# Patient Record
Sex: Female | Born: 1965 | Race: White | Hispanic: No | Smoking: Never smoker
Health system: Southern US, Community
[De-identification: ages and names within clinical notes are randomized; demographics above are authoritative.]

## PROBLEM LIST (undated history)

## (undated) DIAGNOSIS — J449 Chronic obstructive pulmonary disease, unspecified: Secondary | ICD-10-CM

## (undated) DIAGNOSIS — G8220 Paraplegia, unspecified: Secondary | ICD-10-CM

## (undated) DIAGNOSIS — Z9889 Other specified postprocedural states: Secondary | ICD-10-CM

---

## 2006-10-30 ENCOUNTER — Emergency Department: Payer: Self-pay

## 2006-10-30 ENCOUNTER — Other Ambulatory Visit: Payer: Self-pay

## 2007-03-18 ENCOUNTER — Ambulatory Visit: Payer: Self-pay

## 2007-03-22 ENCOUNTER — Ambulatory Visit: Payer: Self-pay

## 2007-04-09 ENCOUNTER — Emergency Department: Payer: Self-pay | Admitting: Emergency Medicine

## 2007-05-07 ENCOUNTER — Ambulatory Visit: Payer: Self-pay | Admitting: Gastroenterology

## 2007-08-01 ENCOUNTER — Ambulatory Visit: Payer: Self-pay

## 2007-08-05 ENCOUNTER — Ambulatory Visit: Payer: Self-pay

## 2007-08-16 ENCOUNTER — Ambulatory Visit: Payer: Self-pay | Admitting: Internal Medicine

## 2007-12-18 ENCOUNTER — Ambulatory Visit: Payer: Self-pay | Admitting: Internal Medicine

## 2008-01-20 ENCOUNTER — Inpatient Hospital Stay: Payer: Self-pay | Admitting: Internal Medicine

## 2008-02-19 ENCOUNTER — Ambulatory Visit: Payer: Self-pay | Admitting: Internal Medicine

## 2008-04-03 ENCOUNTER — Inpatient Hospital Stay: Payer: Self-pay | Admitting: Internal Medicine

## 2008-04-03 ENCOUNTER — Other Ambulatory Visit: Payer: Self-pay

## 2008-06-03 ENCOUNTER — Encounter: Payer: Self-pay | Admitting: Internal Medicine

## 2008-07-03 ENCOUNTER — Encounter: Payer: Self-pay | Admitting: Internal Medicine

## 2008-08-03 ENCOUNTER — Encounter: Payer: Self-pay | Admitting: Internal Medicine

## 2008-08-06 ENCOUNTER — Ambulatory Visit: Payer: Self-pay | Admitting: Internal Medicine

## 2008-08-31 ENCOUNTER — Encounter: Payer: Self-pay | Admitting: Internal Medicine

## 2008-10-01 ENCOUNTER — Encounter: Payer: Self-pay | Admitting: Internal Medicine

## 2008-10-15 ENCOUNTER — Ambulatory Visit: Payer: Self-pay | Admitting: Internal Medicine

## 2010-05-21 ENCOUNTER — Emergency Department: Payer: Self-pay | Admitting: Emergency Medicine

## 2010-11-21 ENCOUNTER — Emergency Department: Payer: Self-pay | Admitting: Emergency Medicine

## 2010-11-21 ENCOUNTER — Emergency Department (HOSPITAL_COMMUNITY)
Admission: EM | Admit: 2010-11-21 | Discharge: 2010-11-21 | Disposition: A | Discharge status: Home / Self Care | Payer: Medicare Other | Attending: Emergency Medicine | Admitting: Emergency Medicine

## 2010-11-21 DIAGNOSIS — T8389XA Other specified complication of genitourinary prosthetic devices, implants and grafts, initial encounter: Secondary | ICD-10-CM | POA: Insufficient documentation

## 2010-11-21 DIAGNOSIS — Y833 Surgical operation with formation of external stoma as the cause of abnormal reaction of the patient, or of later complication, without mention of misadventure at the time of the procedure: Secondary | ICD-10-CM | POA: Insufficient documentation

## 2010-11-21 DIAGNOSIS — G8250 Quadriplegia, unspecified: Secondary | ICD-10-CM | POA: Insufficient documentation

## 2010-11-21 DIAGNOSIS — R339 Retention of urine, unspecified: Secondary | ICD-10-CM | POA: Insufficient documentation
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
## 2011-02-25 ENCOUNTER — Other Ambulatory Visit: Payer: Self-pay | Admitting: Family Medicine

## 2011-09-19 ENCOUNTER — Ambulatory Visit: Payer: Self-pay | Admitting: Family Medicine

## 2015-01-27 ENCOUNTER — Encounter: Payer: Medicare Other | Attending: General Surgery | Admitting: General Surgery

## 2015-01-27 DIAGNOSIS — Z87891 Personal history of nicotine dependence: Secondary | ICD-10-CM | POA: Diagnosis not present

## 2015-01-27 DIAGNOSIS — L89112 Pressure ulcer of right upper back, stage 2: Secondary | ICD-10-CM | POA: Diagnosis not present

## 2015-01-27 DIAGNOSIS — G8220 Paraplegia, unspecified: Secondary | ICD-10-CM | POA: Insufficient documentation

## 2015-01-27 DIAGNOSIS — L8993 Pressure ulcer of unspecified site, stage 3: Secondary | ICD-10-CM | POA: Diagnosis not present

## 2015-01-27 DIAGNOSIS — L89154 Pressure ulcer of sacral region, stage 4: Secondary | ICD-10-CM | POA: Insufficient documentation

## 2015-01-27 DIAGNOSIS — J449 Chronic obstructive pulmonary disease, unspecified: Secondary | ICD-10-CM | POA: Insufficient documentation

## 2015-01-27 DIAGNOSIS — J45909 Unspecified asthma, uncomplicated: Secondary | ICD-10-CM | POA: Diagnosis not present

## 2015-01-27 NOTE — Progress Notes (Signed)
See i heal 

## 2015-01-28 NOTE — Progress Notes (Addendum)
[name] ([id number]) Visit Report for 01/27/2015 Chief Complaint Document Details Patient Name: [name], [name] Date of Service: 01/27/2015 1:00 PM Medical Record Number: [id number] Patient Account Number: [id number] Date of Birth/Sex: [birth year] (49 y.o. Female) Treating RN: Primary Care Physician: [place], JULIE Other Clinician: Referring Physician: Treating Physician/Extender: Elayne Snare in Treatment: 0 Information Obtained from: Patient Electronic Signature(s) Signed: 01/27/2015 4:39:50 PM By: Ardath Sax MD Entered By: Ardath Sax on 01/27/2015 14:17:52 Schnitzer, [name] ([id number]) -------------------------------------------------------------------------------- HPI Details Patient Name: [name] Date of Service: 01/27/2015 1:00 PM Medical Record Number: [id number] Patient Account Number: [id number] Date of Birth/Sex: April 08, 1966 (49 y.o. Female) Treating RN: Primary Care Physician: [place], JULIE Other Clinician: Referring Physician: Treating Physician/Extender: Elayne Snare in Treatment: 0 Electronic Signature(s) Signed: 01/27/2015 4:39:50 PM By: Ardath Sax MD Entered By: Ardath Sax on 01/27/2015 14:18:03 Mathias, [name] ([id number]) -------------------------------------------------------------------------------- Physical Exam Details Patient Name: [name] Date of Service: 01/27/2015 1:00 PM Medical Record Number: [id number] Patient Account Number: [id number] Date of Birth/Sex: [birth year] (49 y.o. Female) Treating RN: Primary Care Physician: [place], JULIE Other Clinician: Referring Physician: Treating Physician/Extender: Elayne Snare in Treatment: 0 Electronic Signature(s) Signed: 01/27/2015 4:39:50 PM By: Ardath Sax MD Entered By: Ardath Sax on 01/27/2015 14:18:16 Desroches, [name] ([id number]) -------------------------------------------------------------------------------- Physician Orders  Details Patient Name: [name] Date of Service: 01/27/2015 1:00 PM Medical Record Patient Account Number: [id number] Number: Afful, RN, BSN, Treating RN: 1965/11/28 (49 y.o. [place] Sink Date of Birth/Sex: Female) Other Clinician: Primary Care Physician: [place], JULIE Treating Anayia Eugene Referring Physician: Physician/Extender: Tania Ade in Treatment: 0 Verbal / Phone Orders: Yes Clinician: Afful, RN, BSN, Rita Read Back and Verified: Yes Diagnosis Coding Wound Cleansing Wound #1 Right Scapula o Cleanse wound with mild soap and water o May Shower, gently pat wound dry prior to applying new dressing. Wound #2 Midline Sacrum o Cleanse wound with mild soap and water o May Shower, gently pat wound dry prior to applying new dressing. Skin Barriers/Peri-Wound Care Wound #1 Right Scapula o Barrier cream Wound #2 Midline Sacrum o Barrier cream Primary Wound Dressing Wound #1 Right Scapula o Aquacel Ag Wound #2 Midline Sacrum o Aquacel Ag Secondary Dressing Wound #1 Right Scapula o Boardered Foam Dressing Wound #2 Midline Sacrum o Boardered Foam Dressing Dressing Change Frequency Wound #1 Right Scapula o Change dressing every other day. AZANA, KIESLER. ([id number]) Wound #2 Midline Sacrum o Change dressing every other day. Follow-up Appointments Wound #1 Right Scapula o Return Appointment in 1 week. Wound #2 Midline Sacrum o Return Appointment in 1 week. Off-Loading Wound #1 Right Scapula o Gel wheelchair cushion o Roho cushion for wheelchair o Turn and reposition every 2 hours Wound #2 Midline Sacrum o Gel wheelchair cushion o Roho cushion for wheelchair o Turn and reposition every 2 hours Additional Orders / Instructions Wound #1 Right Scapula o Other: - Plastics consult with [facility] Wound #2 Midline Sacrum o Other: - Plastics consult with [facility] Wound #1 Right Scapula o  Continue Home Health Visits - [facility] o Home Health Nurse may visit PRN to address patientos wound care needs. o FACE TO FACE ENCOUNTER: MEDICARE and MEDICAID PATIENTS: I certify that this patient is under my care and that I had a face-to-face encounter that meets the physician face-to-face encounter requirements with this patient on this date. The encounter with the patient was in whole or in part for the following MEDICAL CONDITION: (primary reason for  Home Healthcare) MEDICAL NECESSITY: I certify, that based on my findings, NURSING services are a medically necessary home health service. HOME BOUND STATUS: I certify that my clinical findings support that this patient is homebound (i.e., Due to illness or injury, pt requires aid of supportive devices such as crutches, cane, wheelchairs, walkers, the use of special transportation or the assistance of another person to leave their place of residence. There is a normal inability to leave the home and doing so requires considerable and taxing effort. Other absences are for medical reasons / religious services and are infrequent or of short duration when for other reasons). o If current dressing causes regression in wound condition, may D/C ordered dressing product/s and apply Normal Saline Moist Dressing daily until next Wound Healing Center / Other MD appointment. Notify Wound Healing Center of regression in wound condition at [phone]. [name] ([id number]) o Please direct any NON-WOUND related issues/requests for orders to patient's Primary Care Physician Wound #2 Midline Sacrum o Continue Home Health Visits - [facility] o Home Health Nurse may visit PRN to address patientos wound care needs. o FACE TO FACE ENCOUNTER: MEDICARE and MEDICAID PATIENTS: I certify that this patient is under my care and that I had a face-to-face encounter that meets the physician face-to-face encounter requirements  with this patient on this date. The encounter with the patient was in whole or in part for the following MEDICAL CONDITION: (primary reason for Home Healthcare) MEDICAL NECESSITY: I certify, that based on my findings, NURSING services are a medically necessary home health service. HOME BOUND STATUS: I certify that my clinical findings support that this patient is homebound (i.e., Due to illness or injury, pt requires aid of supportive devices such as crutches, cane, wheelchairs, walkers, the use of special transportation or the assistance of another person to leave their place of residence. There is a normal inability to leave the home and doing so requires considerable and taxing effort. Other absences are for medical reasons / religious services and are infrequent or of short duration when for other reasons). o If current dressing causes regression in wound condition, may D/C ordered dressing product/s and apply Normal Saline Moist Dressing daily until next Wound Healing Center / Other MD appointment. Notify Wound Healing Center of regression in wound condition at [phone]. o Please direct any NON-WOUND related issues/requests for orders to patient's Primary Care Physician Electronic Signature(s) Signed: 01/27/2015 3:20:22 PM By: Elpidio Eric BSN, RN Entered By: Elpidio Eric on 01/27/2015 15:20:21 Winnett, [name] ([id number]) -------------------------------------------------------------------------------- Problem List Details Patient Name: LENISHA, LACAP. Date of Service: 01/27/2015 1:00 PM Medical Record Number: [id number] Patient Account Number: [id number] Date of Birth/Sex: 16-Apr-1966 (49 y.o. Female) Treating RN: Primary Care Physician: [place], JULIE Other Clinician: Referring Physician: Treating Physician/Extender: Elayne Snare in Treatment: 0 Active Problems Inactive Problems Resolved Problems Electronic Signature(s) Signed: 01/27/2015 4:39:50 PM By: Ardath Sax MD Entered By: Ardath Sax on 01/27/2015 14:17:36 Aken, [name] ([id number]) -------------------------------------------------------------------------------- Progress Note Details Patient Name: KARYN, BRULL. Date of Service: 01/27/2015 1:00 PM Medical Record Number: [id number] Patient Account Number: [id number] Date of Birth/Sex: Oct 22, 1965 (49 y.o. Female) Treating RN: Primary Care Physician: [place], JULIE Other Clinician: Referring Physician: Treating Physician/Extender: Elayne Snare in Treatment: 0 Subjective Chief Complaint Information obtained from Patient Wound History Patient presents with 2 open wounds that have been present for approximately 4 months. Patient has been treating wounds in the following manner: aquacel and abd pad. Laboratory tests have not  been performed in the last month. Patient reportedly has not tested positive for an antibiotic resistant organism. Patient reportedly has not tested positive for osteomyelitis. Patient reportedly has not had testing performed to evaluate circulation in the legs. Patient History Information obtained from Patient. Allergies Shellfish Containing Products Social History Former smoker, Marital Status - Separated, Alcohol Use - Never, Drug Use - No History, Caffeine Use - Never. Medical History Respiratory Patient has history of Asthma, Chronic Obstructive Pulmonary Disease (COPD) Neurologic Patient has history of Paraplegia - C 3 paraplegia Hospitalization/Surgery History - 05/13/2014, [facility], collapsed lung and lung congestion. Medical And Surgical History Notes Genitourinary urostomy Musculoskeletal right arm contracture Review of Systems (ROS) Constitutional Symptoms (General Health) The patient has no complaints or symptoms. BLIMY, NAPOLEON. ([id number]) Eyes The patient has no complaints or symptoms. Ear/Nose/Mouth/Throat The patient has no complaints or  symptoms. Hematologic/Lymphatic The patient has no complaints or symptoms. Respiratory The patient has no complaints or symptoms. Cardiovascular The patient has no complaints or symptoms. Gastrointestinal The patient has no complaints or symptoms. Endocrine The patient has no complaints or symptoms. Immunological The patient has no complaints or symptoms. Integumentary (Skin) The patient has no complaints or symptoms. Musculoskeletal The patient has no complaints or symptoms. Oncologic The patient has no complaints or symptoms. Psychiatric The patient has no complaints or symptoms. Objective Constitutional Vitals Time Taken: 1:36 PM, Height: 63 in, Source: Stated, Weight: 100 lbs, Source: Stated, BMI: 17.7, Temperature: 97.8 F, Pulse: 79 bpm, Respiratory Rate: 16 breaths/min, Blood Pressure: 99/61 mmHg. Integumentary (Hair, Skin) Wound #1 status is Open. Original cause of wound was Pressure Injury. The wound is located on the Right Scapula. The wound measures 1.3cm length x 1.2cm width x 0.8cm depth; 1.225cm^2 area and 0.98cm^3 volume. There is bone exposed. There is no tunneling noted, however, there is undermining starting at 10:00 and ending at 6:00 with a maximum distance of 2.5cm. There is a large amount of purulent drainage noted. The wound margin is flat and intact. There is large (67-100%) red, pink granulation within the wound bed. There is no necrotic tissue within the wound bed. The periwound skin appearance exhibited: Moist. The periwound skin appearance did not exhibit: Callus, Crepitus, Excoriation, Fluctuance, Friable, Induration, Localized Edema, Rash, Scarring, Dry/Scaly, Maceration, Atrophie Blanche, Cyanosis, Ecchymosis, Hemosiderin Staining, Mottled, Pallor, Rubor, Erythema. Periwound temperature was noted as No Abnormality. The periwound has tenderness on palpation. Wound #2 status is Open. Original cause of wound was Pressure Injury. The wound is located on  the [place]. ([id number]) Midline Sacrum. The wound measures 0.6cm length x 1.6cm width x 0.1cm depth; 0.754cm^2 area and 0.075cm^3 volume. The wound is limited to skin breakdown. There is no tunneling or undermining noted. There is a medium amount of serous drainage noted. The wound margin is flat and intact. There is small (1-33%) pink granulation within the wound bed. There is a large (67-100%) amount of necrotic tissue within the wound bed including Adherent Slough. The periwound skin appearance exhibited: Scarring, Moist. The periwound skin appearance did not exhibit: Callus, Crepitus, Excoriation, Fluctuance, Friable, Induration, Localized Edema, Rash, Dry/Scaly, Maceration, Atrophie Blanche, Cyanosis, Ecchymosis, Hemosiderin Staining, Mottled, Pallor, Rubor, Erythema. Periwound temperature was noted as No Abnormality. The periwound has tenderness on palpation. Assessment Plan Wound Cleansing: Wound #1 Right Scapula: Cleanse wound with mild soap and water May Shower, gently pat wound dry prior to applying new dressing. Wound #2 Midline Sacrum: Cleanse wound with mild soap and water May Shower, gently pat wound dry  prior to applying new dressing. Skin Barriers/Peri-Wound Care: Wound #1 Right Scapula: Barrier cream Wound #2 Midline Sacrum: Barrier cream Primary Wound Dressing: Wound #1 Right Scapula: Aquacel Ag Wound #2 Midline Sacrum: Aquacel Ag Secondary Dressing: Wound #1 Right Scapula: Boardered Foam Dressing Wound #2 Midline Sacrum: Boardered Foam Dressing Dressing Change Frequency: Wound #1 Right Scapula: Haydel, Lyndall M. ([id number]) Change dressing every other day. Wound #2 Midline Sacrum: Change dressing every other day. Follow-up Appointments: Wound #1 Right Scapula: Return Appointment in 1 week. Wound #2 Midline Sacrum: Return Appointment in 1 week. Off-Loading: Wound #1 Right Scapula: Gel wheelchair cushion Roho cushion for wheelchair Turn  and reposition every 2 hours Wound #2 Midline Sacrum: Gel wheelchair cushion Roho cushion for wheelchair Turn and reposition every 2 hours Additional Orders / Instructions: Wound #1 Right Scapula: Other: - Plastics consult with [facility] Wound #2 Midline Sacrum: Other: - Plastics consult with [facility] Home Health: Wound #1 Right Scapula: Continue Home Health Visits - [facility] Home Health Nurse may visit PRN to address patient s wound care needs. FACE TO FACE ENCOUNTER: MEDICARE and MEDICAID PATIENTS: I certify that this patient is under my care and that I had a face-to-face encounter that meets the physician face-to-face encounter requirements with this patient on this date. The encounter with the patient was in whole or in part for the following MEDICAL CONDITION: (primary reason for Home Healthcare) MEDICAL NECESSITY: I certify, that based on my findings, NURSING services are a medically necessary home health service. HOME BOUND STATUS: I certify that my clinical findings support that this patient is homebound (i.e., Due to illness or injury, pt requires aid of supportive devices such as crutches, cane, wheelchairs, walkers, the use of special transportation or the assistance of another person to leave their place of residence. There is a normal inability to leave the home and doing so requires considerable and taxing effort. Other absences are for medical reasons / religious services and are infrequent or of short duration when for other reasons). If current dressing causes regression in wound condition, may D/C ordered dressing product/s and apply Normal Saline Moist Dressing daily until next Wound Healing Center / Other MD appointment. Notify Wound Healing Center of regression in wound condition at [phone]. Please direct any NON-WOUND related issues/requests for orders to patient's Primary Care Physician Wound #2 Midline Sacrum: Continue Home Health  Visits - [facility] Home Health Nurse may visit PRN to address patient s wound care needs. FACE TO FACE ENCOUNTER: MEDICARE and MEDICAID PATIENTS: I certify that this patient is under my care and that I had a face-to-face encounter that meets the physician face-to-face encounter requirements with this patient on this date. The encounter with the patient was in whole or in part for the following MEDICAL CONDITION: (primary reason for Home Healthcare) MEDICAL NECESSITY: I certify, that based on my findings, NURSING services are a medically necessary home health service. HOME BOUND STATUS: I certify that my clinical findings support that this patient is homebound (i.e., Due to illness or injury, pt requires aid of supportive devices such as crutches, cane, wheelchairs, walkers, the use of special transportation or the assistance of another person to leave their place of residence. There is a HAYLI, MILLIGAN. ([id number]) normal inability to leave the home and doing so requires considerable and taxing effort. Other absences are for medical reasons / religious services and are infrequent or of short duration when for other reasons). If current dressing causes  regression in wound condition, may D/C ordered dressing product/s and apply Normal Saline Moist Dressing daily until next Wound Healing Center / Other MD appointment. Notify Wound Healing Center of regression in wound condition at [phone]. Please direct any NON-WOUND related issues/requests for orders to patient's Primary Care Physician Follow-Up Appointments: A follow-up appointment should be scheduled. Electronic Signature(s) Signed: 02/09/2015 4:43:54 PM By: Dayton Martes RCP, RRT, CHT Previous Signature: 01/27/2015 4:39:50 PM Version By: Ardath Sax MD Entered By: Dayton Martes on 02/09/2015 16:43:54 Burkholder, [name]  ([id number]) -------------------------------------------------------------------------------- ROS/PFSH Details Patient Name: EMIKA, TIANO. Date of Service: 01/27/2015 1:00 PM Medical Record Number: [id number] Patient Account Number: [id number] Date of Birth/Sex: 03/22/1966 (49 y.o. Female) Treating RN: Curtis Sites Primary Care Physician: [place], JULIE Other Clinician: Referring Physician: Treating Physician/Extender: Elayne Snare in Treatment: 0 Information Obtained From Patient Wound History Do you currently have one or more open woundso Yes How many open wounds do you currently haveo 2 Approximately how long have you had your woundso 4 months How have you been treating your wound(s) until nowo aquacel and abd pad Has your wound(s) ever healed and then re-openedo No Have you had any lab work done in the past montho No Have you tested positive for an antibiotic resistant organism (MRSA, VRE)o No Have you tested positive for osteomyelitis (bone infection)o No Have you had any tests for circulation on your legso No Constitutional Symptoms (General Health) Complaints and Symptoms: No Complaints or Symptoms Eyes Complaints and Symptoms: No Complaints or Symptoms Ear/Nose/Mouth/Throat Complaints and Symptoms: No Complaints or Symptoms Hematologic/Lymphatic Complaints and Symptoms: No Complaints or Symptoms Respiratory Complaints and Symptoms: No Complaints or Symptoms Medical History: Positive for: Asthma; Chronic Obstructive Pulmonary Disease (COPD) Cardiovascular Everson, Annalis M. ([id number]) Complaints and Symptoms: No Complaints or Symptoms Gastrointestinal Complaints and Symptoms: No Complaints or Symptoms Endocrine Complaints and Symptoms: No Complaints or Symptoms Genitourinary Medical History: Past Medical History Notes: urostomy Immunological Complaints and Symptoms: No Complaints or Symptoms Integumentary (Skin) Complaints and Symptoms: No  Complaints or Symptoms Musculoskeletal Complaints and Symptoms: No Complaints or Symptoms Medical History: Past Medical History Notes: right arm contracture Neurologic Medical History: Positive for: Paraplegia - C 3 paraplegia Oncologic Complaints and Symptoms: No Complaints or Symptoms Psychiatric Complaints and Symptoms: No Complaints or Symptoms Guallpa, Joeline M. ([id number]) Hospitalization / Surgery History Name of Hospital Purpose of Hospitalization/Surgery Date [facility] collapsed lung and lung congestion 05/13/2014 Family and Social History Former smoker; Marital Status - Separated; Alcohol Use: Never; Drug Use: No History; Caffeine Use: Never; Financial Concerns: No; Food, Clothing or Shelter Needs: No; Support System Lacking: No; Transportation Concerns: No; Advanced Directives: No; Patient does not want information on Advanced Directives Electronic Signature(s) Signed: 01/27/2015 4:40:42 PM By: Curtis Sites Entered By: Curtis Sites on 01/27/2015 13:46:29 Jerez, [name] ([id number]) -------------------------------------------------------------------------------- SuperBill Details Patient Name: [name] Date of Service: 01/27/2015 Medical Record Patient Account Number: [id number] Number: Afful, RN, BSN, Treating RN: 04-28-1966 (49 y.o. [place] Sink Date of Birth/Sex: Female) Other Clinician: Primary Care Physician: [place], JULIE Treating Terrika Zuver Referring Physician: Physician/Extender: Tania Ade in Treatment: 0 Diagnosis Coding Facility Procedures CPT4 Code: [id number] Description: [phone] - WOUND CARE VISIT-LEV 4 EST PT Modifier: Quantity: 1 Electronic Signature(s) Signed: 01/27/2015 4:34:46 PM By: Elpidio Eric BSN, RN Entered By: Elpidio Eric on 01/27/2015 15:19:48

## 2015-01-28 NOTE — Progress Notes (Signed)
[name], [name] ([id number]) Visit Report for 01/27/2015 Abuse/Suicide Risk Screen Details Patient Name: [name], [name] Date of Service: 01/27/2015 1:00 PM Medical Record Number: [id number] Patient Account Number: [id number] Date of Birth/Sex: 04-22-1966 (49 y.o. Female) Treating RN: Curtis Sites Primary Care Physician: [place], JULIE Other Clinician: Referring Physician: Treating Physician/Extender: Elayne Snare in Treatment: 0 Abuse/Suicide Risk Screen Items Answer ABUSE/SUICIDE RISK SCREEN: Has anyone close to you tried to hurt or harm you recentlyo No Do you feel uncomfortable with anyone in your familyo No Has anyone forced you do things that you didnot want to doo No Do you have any thoughts of harming yourselfo No Patient displays signs or symptoms of abuse and/or neglect. No Electronic Signature(s) Signed: 01/27/2015 4:40:42 PM By: Curtis Sites Entered By: Curtis Sites on 01/27/2015 13:46:41 [name], [name] -------------------------------------------------------------------------------- Activities of Daily Living Details Patient Name: [name], [name] Date of Service: 01/27/2015 1:00 PM Medical Record Number: [id number] Patient Account Number: [id number] Date of Birth/Sex: Sep 21, 1965 (49 y.o. Female) Treating RN: Curtis Sites Primary Care Physician: [place], JULIE Other Clinician: Referring Physician: Treating Physician/Extender: Elayne Snare in Treatment: 0 Activities of Daily Living Items Answer Activities of Daily Living (Please select one for each item) Drive Automobile Not Able Take Medications Completely Able Use Telephone Completely Able Care for Appearance Completely Able Use Toilet Need Assistance Bath / Shower Need Assistance Dress Self Need Assistance Feed Self Need Assistance Walk Not Able Get In / Out Bed Need Assistance Housework Need Assistance Prepare Meals Need Assistance Handle Money Completely Able Shop  for Self Need Assistance Electronic Signature(s) Signed: 01/27/2015 4:40:42 PM By: Curtis Sites Entered By: Curtis Sites on 01/27/2015 13:47:32 [name], [name] ([id number]) -------------------------------------------------------------------------------- Education Assessment Details Patient Name: [name] Masters. Date of Service: 01/27/2015 1:00 PM Medical Record Number: [id number] Patient Account Number: [id number] Date of Birth/Sex: 07/23/1965 (49 y.o. Female) Treating RN: Curtis Sites Primary Care Physician: [place], JULIE Other Clinician: Referring Physician: Treating Physician/Extender: Elayne Snare in Treatment: 0 Primary Learner Assessed: Patient Learning Preferences/Education Level/Primary Language Learning Preference: Explanation, Demonstration Highest Education Level: High School Preferred Language: English Cognitive Barrier Assessment/Beliefs Language Barrier: No Translator Needed: No Memory Deficit: No Emotional Barrier: No Cultural/Religious Beliefs Affecting Medical No Care: Physical Barrier Assessment Impaired Vision: No Impaired Hearing: No Decreased Hand dexterity: No Knowledge/Comprehension Assessment Knowledge Level: Medium Comprehension Level: Medium Ability to understand written Medium instructions: Ability to understand verbal Medium instructions: Motivation Assessment Anxiety Level: Calm Cooperation: Cooperative Education Importance: Acknowledges Need Interest in Health Problems: Asks Questions Perception: Coherent Willingness to Engage in Self- Medium Management Activities: Readiness to Engage in Self- Medium Management Activities: Electronic Signature(s) [name], [name] ([id number]) Signed: 01/27/2015 4:40:42 PM By: Curtis Sites Entered By: Curtis Sites on 01/27/2015 13:47:56 [name], [name] ([id number]) -------------------------------------------------------------------------------- Fall Risk Assessment  Details Patient Name: [name] Masters. Date of Service: 01/27/2015 1:00 PM Medical Record Number: [id number] Patient Account Number: [id number] Date of Birth/Sex: 1966-04-05 (49 y.o. Female) Treating RN: Curtis Sites Primary Care Physician: [place], JULIE Other Clinician: Referring Physician: Treating Physician/Extender: Elayne Snare in Treatment: 0 Fall Risk Assessment Items FALL RISK ASSESSMENT: History of falling - immediate or within 3 months 0 No Secondary diagnosis 0 No Ambulatory aid None/bed rest/wheelchair/nurse 0 Yes Crutches/cane/walker 0 No Furniture 0 No IV Access/Saline Lock 0 No Gait/Training Normal/bed rest/immobile 0 Yes Weak 0 No Impaired 0 No Mental Status Oriented to own ability 0 Yes Electronic Signature(s) Signed: 01/27/2015 4:40:42 PM By: Curtis Sites Entered By: Curtis Sites  on 01/27/2015 13:48:05 [name], [name] ([id number]) -------------------------------------------------------------------------------- Nutrition Risk Assessment Details Patient Name: [name], [name] Date of Service: 01/27/2015 1:00 PM Medical Record Number: [id number] Patient Account Number: [id number] Date of Birth/Sex: 03-Dec-1965 (49 y.o. Female) Treating RN: Curtis Sites Primary Care Physician: [place], JULIE Other Clinician: Referring Physician: Treating Physician/Extender: Elayne Snare in Treatment: 0 Height (in): 63 Weight (lbs): 100 Body Mass Index (BMI): 17.7 Nutrition Risk Assessment Items NUTRITION RISK SCREEN: I have an illness or condition that made me change the kind and/or 0 No amount of food I eat I eat fewer than two meals per day 0 No I eat few fruits and vegetables, or milk products 0 No I have three or more drinks of beer, liquor or wine almost every day 0 No I have tooth or mouth problems that make it hard for me to eat 0 No I don't always have enough money to buy the food I need 0 No I eat alone most of the time 0 No I take  three or more different prescribed or over-the-counter drugs a 1 Yes day Without wanting to, I have lost or gained 10 pounds in the last six 0 No months I am not always physically able to shop, cook and/or feed myself 0 No Nutrition Protocols Good Risk Protocol 0 No interventions needed Moderate Risk Protocol Electronic Signature(s) Signed: 01/27/2015 4:40:42 PM By: Curtis Sites Entered By: Curtis Sites on 01/27/2015 13:48:16

## 2015-01-28 NOTE — Progress Notes (Signed)
[name] ([id number]) Visit Report for 01/27/2015 Allergy List Details Patient Name: [name], [name] Date of Service: 01/27/2015 1:00 PM Medical Record Number: [id number] Patient Account Number: [id number] Date of Birth/Sex: 02-10-1966 (50 y.o. Female) Treating RN: Curtis Sites Primary Care Physician: [place], JULIE Other Clinician: Referring Physician: Treating Physician/Extender: Ardath Sax Weeks in Treatment: 0 Allergies Active Allergies Shellfish Containing Products Allergy Notes Electronic Signature(s) Signed: 01/27/2015 4:40:42 PM By: Curtis Sites Entered By: Curtis Sites on 01/27/2015 13:38:53 [name] ([id number]) -------------------------------------------------------------------------------- Arrival Information Details Patient Name: [name], [name] Date of Service: 01/27/2015 1:00 PM Medical Record Number: [id number] Patient Account Number: [id number] Date of Birth/Sex: Jun 03, 1966 (49 y.o. Female) Treating RN: Curtis Sites Primary Care Physician: [place], JULIE Other Clinician: Referring Physician: Treating Physician/Extender: Elayne Snare in Treatment: 0 Visit Information Patient Arrived: Wheel Chair Arrival Time: 13:35 Accompanied By: spouse Transfer Assistance: Manual Patient Identification Verified: Yes Secondary Verification Process Yes Completed: Patient Has Alerts: No Electronic Signature(s) Signed: 01/27/2015 4:39:50 PM By: Ardath Sax MD Entered By: Ardath Sax on 01/27/2015 14:17:11 [name] ([id number]) -------------------------------------------------------------------------------- Clinic Level of Care Assessment Details Patient Name: [name], [name] Date of Service: 01/27/2015 1:00 PM Medical Record Number: [id number] Patient Account Number: [id number] Date of Birth/Sex: 1966/04/01 (49 y.o. Female) Treating RN: Afful, RN, BSN, [place] Primary Care Physician: [place], JULIE Other Clinician: Referring  Physician: Treating Physician/Extender: Elayne Snare in Treatment: 0 Clinic Level of Care Assessment Items TOOL 2 Quantity Score  - Use when only an EandM is performed on the INITIAL visit 0 ASSESSMENTS - Nursing Assessment / Reassessment X - General Physical Exam (combine w/ comprehensive assessment (listed just 1 20 below) when performed on new pt. evals) X - Comprehensive Assessment (HX, ROS, Risk Assessments, Wounds Hx, etc.) 1 25 ASSESSMENTS - Wound and Skin Assessment / Reassessment X - Simple Wound Assessment / Reassessment - one wound 1 5  - Complex Wound Assessment / Reassessment - multiple wounds 0  - Dermatologic / Skin Assessment (not related to wound area) 0 ASSESSMENTS - Ostomy and/or Continence Assessment and Care  - Incontinence Assessment and Management 0  - Ostomy Care Assessment and Management (repouching, etc.) 0 PROCESS - Coordination of Care X - Simple Patient / Family Education for ongoing care 1 15  - Complex (extensive) Patient / Family Education for ongoing care 0  - Staff obtains Chiropractor, Records, Test Results / Process Orders 0  - Staff telephones HHA, Nursing Homes / Clarify orders / etc 0  - Routine Transfer to another Facility (non-emergent condition) 0  - Routine Hospital Admission (non-emergent condition) 0  - New Admissions / Manufacturing engineer / Ordering NPWT, Apligraf, etc. 0  - Emergency Hospital Admission (emergent condition) 0 X - Simple Discharge Coordination 1 10 [name], [name] ([id number])  - Complex (extensive) Discharge Coordination 0 PROCESS - Special Needs  - Pediatric / Minor Patient Management 0  - Isolation Patient Management 0  - Hearing / Language / Visual special needs 0  - Assessment of Community assistance (transportation, D/C planning, etc.) 0  - Additional assistance / Altered mentation 0  - Support Surface(s) Assessment (bed, cushion, seat, etc.) 0 INTERVENTIONS - Wound  Cleansing / Measurement X - Wound Imaging (photographs - any number of wounds) 1 5  - Wound Tracing (instead of photographs) 0  - Simple Wound Measurement - one wound 0 X - Complex Wound Measurement - multiple wounds 2 5  - Simple Wound Cleansing - one wound 0 X - Complex  Wound Cleansing - multiple wounds 2 5 INTERVENTIONS - Wound Dressings X - Small Wound Dressing one or multiple wounds 2 10 []  - Medium Wound Dressing one or multiple wounds 0 []  - Large Wound Dressing one or multiple wounds 0 []  - Application of Medications - injection 0 INTERVENTIONS - Miscellaneous []  - External ear exam 0 []  - Specimen Collection (cultures, biopsies, blood, body fluids, etc.) 0 []  - Specimen(s) / Culture(s) sent or taken to Lab for analysis 0 []  - Patient Transfer (multiple staff / Michiel Sites Lift / Similar devices) 0 []  - Simple Staple / Suture removal (25 or less) 0 []  - Complex Staple / Suture removal (26 or more) 0 Juhnke, Willo M. ([id number]) []  - Hypo / Hyperglycemic Management (close monitor of Blood Glucose) 0 []  - Ankle / Brachial Index (ABI) - do not check if billed separately 0 Has the patient been seen at the hospital within the last three years: Yes Total Score: 120 Level Of Care: New/Established - Level 4 Electronic Signature(s) Signed: 01/27/2015 4:34:46 PM By: Elpidio Eric BSN, RN Entered By: Elpidio Eric on 01/27/2015 14:29:49 [name] ([id number]) -------------------------------------------------------------------------------- Encounter Discharge Information Details Patient Name: [name], [name]. Date of Service: 01/27/2015 1:00 PM Medical Record Number: [id number] Patient Account Number: [id number] Date of Birth/Sex: 22-Mar-1966 (49 y.o. Female) Treating RN: Curtis Sites Primary Care Physician: [place], JULIE Other Clinician: Referring Physician: Treating Physician/Extender: Elayne Snare in Treatment: 0 Encounter Discharge Information Items Discharge  Pain Level: 0 Discharge Condition: Stable Ambulatory Status: Wheelchair Discharge Destination: Home Private Transportation: Auto Accompanied By: souse Schedule Follow-up Appointment: Yes Medication Reconciliation completed and No provided to Patient/Care Rogerio Boutelle: Clinical Summary of Care: Electronic Signature(s) Signed: 01/27/2015 4:40:42 PM By: Curtis Sites Entered By: Curtis Sites on 01/27/2015 14:40:18 Burstein, [name] ([id number]) -------------------------------------------------------------------------------- Multi Wound Chart Details Patient Name: [name] Date of Service: 01/27/2015 1:00 PM Medical Record Number: [id number] Patient Account Number: [id number] Date of Birth/Sex: February 01, 1966 (49 y.o. Female) Treating RN: Clover Mealy, RN, BSN, [place]ink Primary Care Physician: [place], JULIE Other Clinician: Referring Physician: Treating Physician/Extender: Elayne Snare in Treatment: 0 Vital Signs Height(in): 63 Pulse(bpm): 79 Weight(lbs): 100 Blood Pressure 99/61 (mmHg): Body Mass Index(BMI): 18 Temperature(F): 97.8 Respiratory Rate 16 (breaths/min): Photos: [1:No Photos] [2:No Photos] [N/A:N/A] Wound Location: [1:Right Scapula] [2:Sacrum - Midline] [N/A:N/A] Wounding Event: [1:Pressure Injury] [2:Pressure Injury] [N/A:N/A] Primary Etiology: [1:Pressure Ulcer] [2:Pressure Ulcer] [N/A:N/A] Comorbid History: [1:Asthma, Chronic Obstructive Pulmonary Disease (COPD), Paraplegia] [2:Asthma, Chronic Obstructive Pulmonary Disease (COPD), Paraplegia] [N/A:N/A] Date Acquired: [1:09/21/2014] [2:12/02/2007] [N/A:N/A] Weeks of Treatment: [1:0] [2:0] [N/A:N/A] Wound Status: [1:Open] [2:Open] [N/A:N/A] Measurements L x W x D 1.3x1.2x0.8 [2:0.6x1.6x0.1] [N/A:N/A] (cm) Area (cm) : [1:1.225] [2:0.754] [N/A:N/A] Volume (cm) : [1:0.98] [2:0.075] [N/A:N/A] % Reduction in Area: [1:0.00%] [2:0.00%] [N/A:N/A] % Reduction in Volume: 0.00% [2:0.00%] [N/A:N/A] Starting  Position 1 10 (o'clock): Ending Position 1 [1:6] (o'clock): Maximum Distance 1 2.5 (cm): Undermining: [1:Yes] [2:No] [N/A:N/A] 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] [N/A:N/A] Exudate Amount: [1:Large] [2:Medium] [N/A:N/A] Exudate Type: [1:Purulent] [2:Serous] [N/A:N/A] Exudate Color: [1:yellow, brown, green] [2:amber] [N/A:N/A] Wound Margin: [1:Flat and Intact] [2:Flat and Intact] [N/A:N/A] Granulation Amount: [1:Large (67-100%)] [2:Small (1-33%)] [N/A:N/A] Granulation Quality: Red, Pink Pink N/A Necrotic Amount: None Present (0%) Large (67-100%) N/A Exposed Structures: Bone: Yes Fascia: No N/A Fascia: No Fat: No Fat: No Tendon: No Tendon: No Muscle: No Muscle: No Joint: No Joint: No Bone: No Limited to Skin Breakdown Epithelialization: None None N/A Periwound Skin Texture: Edema: No Scarring: Yes N/A Excoriation: No Edema:  No Induration: No Excoriation: No Callus: No Induration: No Crepitus: No Callus: No Fluctuance: No Crepitus: No Friable: No Fluctuance: No Rash: No Friable: No Scarring: No Rash: No Periwound Skin Moist: Yes Moist: Yes N/A Moisture: Maceration: No Maceration: No Dry/Scaly: No Dry/Scaly: No Periwound Skin Color: Atrophie Blanche: No Atrophie Blanche: No N/A Cyanosis: No Cyanosis: No Ecchymosis: No Ecchymosis: No Erythema: No Erythema: No Hemosiderin Staining: No Hemosiderin Staining: No Mottled: No Mottled: No Pallor: No Pallor: No Rubor: No Rubor: No Temperature: No Abnormality No Abnormality N/A Tenderness on Yes Yes N/A Palpation: Wound Preparation: Ulcer Cleansing: Ulcer Cleansing: N/A Rinsed/Irrigated with Rinsed/Irrigated with Saline Saline Topical Anesthetic Topical Anesthetic Applied: Other: lidocaine Applied: Other: lidocaine 4% 4% Treatment Notes Electronic Signature(s) Signed: 01/27/2015 4:34:46 PM By: Elpidio Eric BSN, RN Entered By: Elpidio Eric on 01/27/2015 14:26:16 Blanchard, [name] ([id number]) -------------------------------------------------------------------------------- Multi-Disciplinary Care Plan Details Patient Name: [name], [name] Date of Service: 01/27/2015 1:00 PM Medical Record Number: [id number] Patient Account Number: [id number] Date of Birth/Sex: 05-26-1966 (49 y.o. Female) Treating RN: Afful, RN, BSN, Psychologist, clinical Primary Care Physician: [place], JULIE Other Clinician: Referring Physician: Treating Physician/Extender: Elayne Snare in Treatment: 0 Active Inactive Abuse / Safety / Falls / Self Care Management Nursing Diagnoses: Impaired home maintenance Impaired physical mobility Potential for falls Self care deficit: actual or potential Goals: Patient/caregiver will verbalize understanding of skin care regimen Date Initiated: 01/27/2015 Goal Status: Active Patient/caregiver will verbalize/demonstrate measure taken to improve self care Date Initiated: 01/27/2015 Goal Status: Active Patient/caregiver will verbalize/demonstrate measures taken to improve the patient's personal safety Date Initiated: 01/27/2015 Goal Status: Active Patient/caregiver will verbalize/demonstrate measures taken to prevent injury and/or falls Date Initiated: 01/27/2015 Goal Status: Active Patient/caregiver will verbalize/demonstrate understanding of what to do in case of emergency Date Initiated: 01/27/2015 Goal Status: Active Interventions: Assess fall risk on admission and as needed Assess: immobility, friction, shearing, incontinence upon admission and as needed Assess impairment of mobility on admission and as needed per policy Assess self care needs on admission and as needed Provide education on basic hygiene Provide education on fall prevention Provide education on personal and home safety Provide education on safe transfers DAIJAH, SCRIVENS ([id number]) Notes: Orientation to the Wound Care Program Nursing Diagnoses: Knowledge deficit related to the wound  healing center program Goals: Patient/caregiver will verbalize understanding of the Wound Healing Center Program Date Initiated: 01/27/2015 Goal Status: Active Interventions: Provide education on orientation to the wound center Notes: Pressure Nursing Diagnoses: Knowledge deficit related to causes and risk factors for pressure ulcer development Knowledge deficit related to management of pressures ulcers Potential for impaired tissue integrity related to pressure, friction, moisture, and shear Goals: Patient will remain free from development of additional pressure ulcers Date Initiated: 01/27/2015 Goal Status: Active Patient will remain free of pressure ulcers Date Initiated: 01/27/2015 Goal Status: Active Patient/caregiver will verbalize risk factors for pressure ulcer development Date Initiated: 01/27/2015 Goal Status: Active Patient/caregiver will verbalize understanding of pressure ulcer management Date Initiated: 01/27/2015 Goal Status: Active Interventions: Assess: immobility, friction, shearing, incontinence upon admission and as needed Assess offloading mechanisms upon admission and as needed Assess potential for pressure ulcer upon admission and as needed Provide education on pressure ulcers Treatment Activities: Patient referred for pressure reduction/relief devices : 01/27/2015 [name], [name] ([id number]) Patient referred for seating evaluation to ensure proper offloading : 01/27/2015 Notes: Wound/Skin Impairment Nursing Diagnoses: Impaired tissue integrity Knowledge deficit related to smoking impact on wound healing Knowledge deficit related to ulceration/compromised skin integrity Goals: Patient/caregiver  will verbalize understanding of skin care regimen Date Initiated: 01/27/2015 Goal Status: Active Ulcer/skin breakdown will have a volume reduction of 30% by week 4 Date Initiated: 01/27/2015 Goal Status: Active Ulcer/skin breakdown will have a volume reduction  of 50% by week 8 Date Initiated: 01/27/2015 Goal Status: Active Ulcer/skin breakdown will have a volume reduction of 80% by week 12 Date Initiated: 01/27/2015 Goal Status: Active Ulcer/skin breakdown will heal within 14 weeks Date Initiated: 01/27/2015 Goal Status: Active Interventions: Assess ulceration(s) every visit Provide education on ulcer and skin care Notes: Electronic Signature(s) Signed: 01/27/2015 4:34:46 PM By: Elpidio Eric BSN, RN Entered By: Elpidio Eric on 01/27/2015 14:26:01 [name], [name] ([id number]) -------------------------------------------------------------------------------- Patient/Caregiver Education Details Patient Name: [name] Date of Service: 01/27/2015 1:00 PM Medical Record Number: [id number] Patient Account Number: [id number] Date of Birth/Gender: 04-Jan-1966 (49 y.o. Female) Treating RN: Curtis Sites Primary Care Physician: [place], JULIE Other Clinician: Referring Physician: Treating Physician/Extender: Elayne Snare in Treatment: 0 Education Assessment Education Provided To: Patient and Caregiver Education Topics Provided Wound/Skin Impairment: Handouts: Other: wound care as ordered Methods: Demonstration, Explain/Verbal Responses: State content correctly Electronic Signature(s) Signed: 01/27/2015 4:40:42 PM By: Curtis Sites Entered By: Curtis Sites on 01/27/2015 14:40:33 [name], [name] -------------------------------------------------------------------------------- Wound Assessment Details Patient Name: [name] Date of Service: 01/27/2015 1:00 PM Medical Record Number: [id number] Patient Account Number: [id number] Date of Birth/Sex: 30-Jan-1966 (49 y.o. Female) Treating RN: Curtis Sites Primary Care Physician: [place], JULIE Other Clinician: Referring Physician: Treating Physician/Extender: Ardath Sax Weeks in Treatment: 0 Wound Status Wound Number: 1 Primary Pressure  Ulcer Etiology: Wound Location: Right Scapula Wound Open Wounding Event: Pressure Injury Status: Date Acquired: 09/21/2014 Comorbid Asthma, Chronic Obstructive Weeks Of Treatment: 0 History: Pulmonary Disease (COPD), Clustered Wound: No Paraplegia Photos Photo Uploaded By: Curtis Sites on 01/27/2015 15:04:30 Wound Measurements Length: (cm) 1.3 Width: (cm) 1.2 Depth: (cm) 0.8 Area: (cm) 1.225 Volume: (cm) 0.98 % Reduction in Area: 0% % Reduction in Volume: 0% Epithelialization: None Tunneling: No Undermining: Yes Starting Position (o'clock): 10 Ending Position (o'clock): 6 Maximum Distance: (cm) 2.5 Wound Description Classification: Category/Stage IV Wound Margin: Flat and Intact Exudate Amount: Large Exudate Type: Purulent Exudate Color: yellow, brown, green Foul Odor After Cleansing: No Wound Bed Saffo, Coletta M. ([id number]) Granulation Amount: Large (67-100%) Exposed Structure Granulation Quality: Red, Pink Fascia Exposed: No Necrotic Amount: None Present (0%) Fat Layer Exposed: No Tendon Exposed: No Muscle Exposed: No Joint Exposed: No Bone Exposed: Yes Periwound Skin Texture Texture Color No Abnormalities Noted: No No Abnormalities Noted: No Callus: No Atrophie Blanche: No Crepitus: No Cyanosis: No Excoriation: No Ecchymosis: No Fluctuance: No Erythema: No Friable: No Hemosiderin Staining: No Induration: No Mottled: No Localized Edema: No Pallor: No Rash: No Rubor: No Scarring: No Temperature / Pain Moisture Temperature: No Abnormality No Abnormalities Noted: No Tenderness on Palpation: Yes Dry / Scaly: No Maceration: No Moist: Yes Wound Preparation Ulcer Cleansing: Rinsed/Irrigated with Saline Topical Anesthetic Applied: Other: lidocaine 4%, Treatment Notes Wound #1 (Right Scapula) 1. Cleansed with: Clean wound with Normal Saline 2. Anesthetic Topical Lidocaine 4% cream to wound bed prior to debridement 4. Dressing  Applied: Aquacel Ag 5. Secondary Dressing Applied Bordered Foam Dressing Electronic Signature(s) Signed: 01/27/2015 4:34:46 PM By: Elpidio Eric BSN, RN Signed: 01/27/2015 4:40:42 PM By: Curtis Sites Entered By: Elpidio Eric on 01/27/2015 14:22:06 [name], [name] ([id number]) [name], [name] ([id number]) -------------------------------------------------------------------------------- Wound Assessment Details Patient Name: [name], [name] Date of Service: 01/27/2015 1:00 PM Medical Record Number:  [id number] Patient Account Number: [id number] Date of Birth/Sex: [birth year] (49 y.o. Female) Treating RN: Curtis Sites Primary Care Physician: [place], JULIE Other Clinician: Referring Physician: Treating Physician/Extender: Ardath Sax Weeks in Treatment: 0 Wound Status Wound Number: 2 Primary Pressure Ulcer Etiology: Wound Location: Sacrum - Midline Wound Open Wounding Event: Pressure Injury Status: Date Acquired: 12/02/2007 Comorbid Asthma, Chronic Obstructive Weeks Of Treatment: 0 History: Pulmonary Disease (COPD), Clustered Wound: No Paraplegia Photos Photo Uploaded By: Curtis Sites on 01/27/2015 15:04:31 Wound Measurements Length: (cm) 0.6 Width: (cm) 1.6 Depth: (cm) 0.1 Area: (cm) 0.754 Volume: (cm) 0.075 % Reduction in Area: 0% % Reduction in Volume: 0% Epithelialization: None Tunneling: No Undermining: No Wound Description Classification: Category/Stage IV Wound Margin: Flat and Intact Exudate Amount: Medium Exudate Type: Serous Exudate Color: amber Foul Odor After Cleansing: No Wound Bed Granulation Amount: Small (1-33%) Exposed Structure Granulation Quality: Pink Fascia Exposed: No Necrotic Amount: Large (67-100%) Fat Layer Exposed: No Necrotic Quality: Adherent Slough Tendon Exposed: No Tenorio, Sheena M. ([id number]) Muscle Exposed: No Joint Exposed: No Bone Exposed: No Limited to Skin Breakdown Periwound Skin Texture Texture Color No  Abnormalities Noted: No No Abnormalities Noted: No Callus: No Atrophie Blanche: No Crepitus: No Cyanosis: No Excoriation: No Ecchymosis: No Fluctuance: No Erythema: No Friable: No Hemosiderin Staining: No Induration: No Mottled: No Localized Edema: No Pallor: No Rash: No Rubor: No Scarring: Yes Temperature / Pain Moisture Temperature: No Abnormality No Abnormalities Noted: No Tenderness on Palpation: Yes Dry / Scaly: No Maceration: No Moist: Yes Wound Preparation Ulcer Cleansing: Rinsed/Irrigated with Saline Topical Anesthetic Applied: Other: lidocaine 4%, Treatment Notes Wound #2 (Midline Sacrum) 1. Cleansed with: Clean wound with Normal Saline 2. Anesthetic Topical Lidocaine 4% cream to wound bed prior to debridement 4. Dressing Applied: Aquacel Ag 5. Secondary Dressing Applied Bordered Foam Dressing Electronic Signature(s) Signed: 01/27/2015 4:34:46 PM By: Elpidio Eric BSN, RN Signed: 01/27/2015 4:40:42 PM By: Curtis Sites Entered By: Elpidio Eric on 01/27/2015 14:22:39 Lemmons, [name] -------------------------------------------------------------------------------- Vitals Details Patient Name: NAKYAH, ERDMANN. Date of Service: 01/27/2015 1:00 PM Medical Record Number: [id number] Patient Account Number: [id number] Date of Birth/Sex: August 04, 1965 (49 y.o. Female) Treating RN: Curtis Sites Primary Care Physician: [place], JULIE Other Clinician: Referring Physician: Treating Physician/Extender: Elayne Snare in Treatment: 0 Vital Signs Time Taken: 13:36 Temperature (F): 97.8 Height (in): 63 Pulse (bpm): 79 Source: Stated Respiratory Rate (breaths/min): 16 Weight (lbs): 100 Blood Pressure (mmHg): 99/61 Source: Stated Reference Range: 80 - 120 mg / dl Body Mass Index (BMI): 17.7 Electronic Signature(s) Signed: 01/27/2015 4:40:42 PM By: Curtis Sites Entered By: Curtis Sites on 01/27/2015 13:38:13

## 2015-02-04 ENCOUNTER — Encounter: Payer: Medicare Other | Attending: Surgery | Admitting: Surgery

## 2015-02-04 DIAGNOSIS — E440 Moderate protein-calorie malnutrition: Secondary | ICD-10-CM | POA: Diagnosis not present

## 2015-02-04 DIAGNOSIS — L89114 Pressure ulcer of right upper back, stage 4: Secondary | ICD-10-CM | POA: Insufficient documentation

## 2015-02-04 DIAGNOSIS — L89154 Pressure ulcer of sacral region, stage 4: Secondary | ICD-10-CM | POA: Insufficient documentation

## 2015-02-04 DIAGNOSIS — G8252 Quadriplegia, C1-C4 incomplete: Secondary | ICD-10-CM | POA: Insufficient documentation

## 2015-02-04 NOTE — Progress Notes (Addendum)
TYKISHA, AREOLA ([id number]) Visit Report for 02/04/2015 Chief Complaint Document Details Patient Name: [name], [name] Date of Service: 02/04/2015 1:45 PM Medical Record Number: [id number] Patient Account Number: [id number] Date of Birth/Sex: [birth year] (49 y.o. Female) Treating RN: Primary Care Physician: [place], JULIE Other Clinician: Referring Physician: Treating Physician/Extender: Rudene Re in Treatment: 1 Information Obtained from: Patient Chief Complaint Patient presents to the wound care center for a consult due non healing wound. 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 She comes in for a pressure injury to the right scapular region and the sacral region. She has this for several months. Electronic Signature(s) Signed: 02/04/2015 2:45:19 PM By: Evlyn Kanner MD, FACS Entered By: Evlyn Kanner on 02/04/2015 14:45:18 Klar, [name] ([id number]) -------------------------------------------------------------------------------- Debridement Details Patient Name: [name], [name] Date of Service: 02/04/2015 1:45 PM Medical Record Number: [id number] Patient Account Number: [id number] Date of Birth/Sex: 06-17-1966 (49 y.o. Female) Treating RN: Primary Care Physician: [place], JULIE Other Clinician: Referring Physician: Treating Physician/Extender: Rudene Re in Treatment: 1 Debridement Performed for Wound #1 Right Scapula Assessment: Performed By: Physician Tristan Schroeder., MD Debridement: Debridement Pre-procedure Yes Verification/Time Out Taken: Start Time: 14:15 Pain Control: Lidocaine 4% Topical Solution Level: Skin/Subcutaneous Tissue Total Area Debrided (L x 1.3 (cm) x 0.8 (cm) = 1.04 (cm) W): Tissue and other Exudate, Fat, Fibrin/Slough, Subcutaneous material debrided: Instrument: Curette Bleeding: Moderate Hemostasis Achieved: Pressure End Time: 14:20 Procedural Pain: 0 Post  Procedural Pain: 0 Response to Treatment: Procedure was tolerated well Post Debridement Measurements of Total Wound Length: (cm) 1.3 Stage: Category/Stage IV Width: (cm) 0.8 Depth: (cm) 0.7 Volume: (cm) 0.572 Electronic Signature(s) Signed: 02/04/2015 2:44:24 PM By: Evlyn Kanner MD, FACS Entered By: Evlyn Kanner on 02/04/2015 14:44:23 Schoff, Myranda Judie Petit ([id number]) -------------------------------------------------------------------------------- HPI Details Patient Name: [name] Date of Service: 02/04/2015 1:45 PM Medical Record Number: [id number] Patient Account Number: [id number] Date of Birth/Sex: 1966/06/23 (49 y.o. Female) Treating RN: Primary Care Physician: [place], JULIE Other Clinician: Referring Physician: Treating Physician/Extender: Rudene Re in Treatment: 1 History of Present Illness HPI Description: 49 year old patient was had quadriparesis for the last 7 years and has had previous extensive surgery and reconstruction at [facility]. For about 4-5 months she's had a decubitus ulcer on the right scapular region and she has a couple of small areas which have opened out in the region of previous scar tissue in the sacral region. He does not have any other significant comorbidities. She has a good Roho cushion for her wheelchair and she has a specialized mattress for sleep. She has stopped smoking a year ago. Electronic Signature(s) Signed: 02/04/2015 2:46:32 PM By: Evlyn Kanner MD, FACS Entered By: Evlyn Kanner on 02/04/2015 14:46:32 Hofbauer, [name] ([id number]) -------------------------------------------------------------------------------- Physical Exam Details Patient Name: [name], [name] Date of Service: 02/04/2015 1:45 PM Medical Record Number: [id number] Patient Account Number: [id number] Date of Birth/Sex: March 06, 1966 (49 y.o. Female) Treating RN: Primary Care Physician: [place], JULIE Other Clinician: Referring  Physician: Treating Physician/Extender: Rudene Re in Treatment: 1 Constitutional . Pulse regular. Respirations normal and unlabored. Afebrile. . Eyes Nonicteric. Reactive to light. Ears, Nose, Mouth, and Throat Lips, teeth, and gums WNL.Marland Kitchen Moist mucosa without lesions . Neck supple and nontender. No palpable supraclavicular or cervical adenopathy. Normal sized without goiter. Respiratory WNL. No retractions.. Cardiovascular Pedal Pulses WNL. No clubbing, cyanosis or edema. Gastrointestinal (GI) Abdomen without masses or tenderness.. No liver or spleen enlargement or tenderness.Marland Kitchen  Integumentary (Hair, Skin) No suspicious lesions. No crepitus or fluctuance. No peri-wound warmth or erythema. No masses.Marland Kitchen Psychiatric Judgement and insight Intact.. No evidence of depression, anxiety, or agitation.. Notes The right scapular region has a fairly deep ulceration which is undermined between the 10:00 and 5:00 position. There is a thick layer of Debbie over the base of this ulcer and this need sharp debridement. In the sacral region she has what looks like a previous skin graft and scar on the sacrum and at about the 6:00 position there is some bone protruding with superficial ulceration. She also has ulceration at about the 1:00 position. There is no purulence to this. Electronic Signature(s) Signed: 02/04/2015 2:48:34 PM By: Evlyn Kanner MD, FACS Entered By: Evlyn Kanner on 02/04/2015 14:48:33 Flanagin, [name] ([id number]) -------------------------------------------------------------------------------- Physician Orders Details Patient Name: [name] Date of Service: 02/04/2015 1:45 PM Medical Record Patient Account Number: [id number] Number: Afful, RN, BSN, Treating RN: 07-27-1965 (49 y.o. [place] Sink Date of Birth/Sex: Female) Other Clinician: Primary Care Physician: [place], JULIE Treating Capone Schwinn Referring Physician: Physician/Extender: Tania Ade in  Treatment: 1 Verbal / Phone Orders: Yes Clinician: Afful, RN, BSN, Rita Read Back and Verified: Yes Diagnosis Coding Wound Cleansing Wound #1 Right Scapula o Cleanse wound with mild soap and water o May Shower, gently pat wound dry prior to applying new dressing. Wound #2 Midline Sacrum o Cleanse wound with mild soap and water o May Shower, gently pat wound dry prior to applying new dressing. Skin Barriers/Peri-Wound Care Wound #1 Right Scapula o Barrier cream Wound #2 Midline Sacrum o Barrier cream Primary Wound Dressing Wound #1 Right Scapula o Aquacel Ag Wound #2 Midline Sacrum o Aquacel Ag Secondary Dressing Wound #1 Right Scapula o Boardered Foam Dressing Wound #2 Midline Sacrum o Boardered Foam Dressing Dressing Change Frequency Wound #1 Right Scapula o Change dressing every other day. RONDA, RAJKUMAR. ([id number]) Wound #2 Midline Sacrum o Change dressing every other day. Follow-up Appointments Wound #1 Right Scapula o Return Appointment in 1 week. Wound #2 Midline Sacrum o Return Appointment in 2 weeks. Off-Loading Wound #1 Right Scapula o Gel wheelchair cushion o Roho cushion for wheelchair o Turn and reposition every 2 hours Wound #2 Midline Sacrum o Gel wheelchair cushion o Roho cushion for wheelchair o Turn and reposition every 2 hours Home Health Wound #1 Right Scapula o Continue Home [facility] Visits - [facility] o Home Health Nurse may visit PRN to address patientos wound care needs. o FACE TO FACE ENCOUNTER: MEDICARE and MEDICAID PATIENTS: I certify that this patient is under my care and that I had a face-to-face encounter that meets the physician face-to-face encounter requirements with this patient on this date. The encounter with the patient was in whole or in part for the following MEDICAL CONDITION: (primary reason for Home Healthcare) MEDICAL NECESSITY: I certify, that based on my  findings, NURSING services are a medically necessary home health service. HOME BOUND STATUS: I certify that my clinical findings support that this patient is homebound (i.e., Due to illness or injury, pt requires aid of supportive devices such as crutches, cane, wheelchairs, walkers, the use of special transportation or the assistance of another person to leave their place of residence. There is a normal inability to leave the home and doing so requires considerable and taxing effort. Other absences are for medical reasons / religious services and are infrequent or of short duration when for other reasons). o If current dressing causes regression in wound condition,  may D/C ordered dressing product/s and apply Normal Saline Moist Dressing daily until next Wound Healing Center / Other MD appointment. Notify Wound Healing Center of regression in wound condition at [phone]. o Please direct any NON-WOUND related issues/requests for orders to patient's Primary Care Physician Wound #2 Midline Sacrum o Continue Home [facility] Visits - [facility] o Home Health Nurse may visit PRN to address patientos wound care needs. KIANTE, PETROVICH ([id number]) o FACE TO FACE ENCOUNTER: MEDICARE and MEDICAID PATIENTS: I certify that this patient is under my care and that I had a face-to-face encounter that meets the physician face-to-face encounter requirements with this patient on this date. The encounter with the patient was in whole or in part for the following MEDICAL CONDITION: (primary reason for Home Healthcare) MEDICAL NECESSITY: I certify, that based on my findings, NURSING services are a medically necessary home health service. HOME BOUND STATUS: I certify that my clinical findings support that this patient is homebound (i.e., Due to illness or injury, pt requires aid of supportive devices such as crutches, cane, wheelchairs, walkers, the use of special transportation or the  assistance of another person to leave their place of residence. There is a normal inability to leave the home and doing so requires considerable and taxing effort. Other absences are for medical reasons / religious services and are infrequent or of short duration when for other reasons). o If current dressing causes regression in wound condition, may D/C ordered dressing product/s and apply Normal Saline Moist Dressing daily until next Wound Healing Center / Other MD appointment. Notify Wound Healing Center of regression in wound condition at [phone]. o Please direct any NON-WOUND related issues/requests for orders to patient's Primary Care Physician Radiology o X-ray, other - Pelvic and sacram oooo Electronic Signature(s) Signed: 02/04/2015 2:34:45 PM By: Elpidio Eric BSN, RN Signed: 02/04/2015 3:04:50 PM By: Evlyn Kanner MD, FACS Entered By: Elpidio Eric on 02/04/2015 14:34:44 Shave, [name] ([id number]) -------------------------------------------------------------------------------- Problem List Details Patient Name: AIRA, SALLADE. Date of Service: 02/04/2015 1:45 PM Medical Record Number: [id number] Patient Account Number: [id number] Date of Birth/Sex: [birth year] (49 y.o. 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 Date of Service: 02/04/2015 1:45 PM Medical Record Number: [id number] Patient Account Number: [id number] Date of Birth/Sex: 1965/08/19 (49 y.o. Female) Treating RN: Primary Care Physician: [place], JULIE Other Clinician: Referring Physician: Treating Physician/Extender: Rudene Re in Treatment: 1 Subjective Chief Complaint Information obtained from Patient Patient presents to the wound care center for a consult due non healing wound. 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 She comes in for a pressure injury to the right scapular region and the sacral region. She has this for several months. History of Present Illness (HPI) 49 year old patient was had quadriparesis for the last 7 years  and has had previous extensive surgery and reconstruction at [facility]. For about 4-5 months she's had a decubitus ulcer on the right scapular region and she has a couple of small areas which have opened out in the region of previous scar tissue in the sacral region. He does not have any other significant comorbidities. She has a good Roho cushion for her wheelchair and she has a specialized mattress for sleep. She has stopped smoking a year ago. Objective Constitutional Pulse regular. Respirations normal and unlabored. Afebrile. Vitals Time Taken: 1:58 PM, Height: 63 in, Weight: 100 lbs, BMI: 17.7, Temperature: 98.5 F, Pulse: 74 bpm, Respiratory Rate: 16 breaths/min, Blood Pressure: 105/67 mmHg. Eyes Nonicteric. Reactive to light. Ears, Nose, Mouth, and Throat Lips, teeth, and gums WNL.Marland Kitchen Moist mucosa without lesions . Neck supple and nontender. No palpable  supraclavicular or cervical adenopathy. Normal sized without goiter. Respiratory Axe, Kaleen M. ([id number]) WNL. No retractions.. Cardiovascular Pedal Pulses WNL. No clubbing, cyanosis or edema. Gastrointestinal (GI) Abdomen without masses or tenderness.. No liver or spleen enlargement or tenderness.Marland Kitchen Psychiatric Judgement and insight Intact.. No evidence of depression, anxiety, or agitation.. General Notes: The right scapular region has a fairly deep ulceration which is undermined between the 10:00 and 5:00 position. There is a thick layer of Debbie over the base of this ulcer and this need sharp debridement. In the sacral region she has what looks like a previous skin graft and scar on the sacrum and at about the 6:00 position there is some bone protruding with superficial ulceration. She also has ulceration at about the 1:00 position. There is no purulence to this. Integumentary (Hair, Skin) No suspicious lesions. No crepitus or fluctuance. No peri-wound warmth or erythema. No masses.. Wound #1 status is Open. Original cause of wound was Pressure Injury. The wound is located on the Right Scapula. The wound measures 1.3cm length x 0.8cm width x 0.7cm depth; 0.817cm^2 area and 0.572cm^3 volume. There is bone exposed. There is no tunneling noted, however, there is undermining starting at 11:00 and ending at 5:00 with a maximum distance of 2cm. There is a large amount of purulent drainage noted. The wound margin is flat and intact. There is large (67-100%) red, pink granulation within the wound bed. There is no necrotic tissue within the wound bed. The periwound skin appearance exhibited: Moist. The periwound skin appearance did not exhibit: Callus, Crepitus, Excoriation, Fluctuance, Friable, Induration, Localized Edema, Rash, Scarring, Dry/Scaly, Maceration, Atrophie Blanche, Cyanosis, Ecchymosis, Hemosiderin Staining, Mottled, Pallor, Rubor, Erythema. Periwound temperature was noted  as No Abnormality. The periwound has tenderness on palpation. Wound #2 status is Open. Original cause of wound was Pressure Injury. The wound is located on the Midline Sacrum. The wound measures 3.6cm length x 4.5cm width x 0.1cm depth; 12.723cm^2 area and 1.272cm^3 volume. There is bone exposed. There is no tunneling or undermining noted. There is a medium amount of serous drainage noted. The wound margin is flat and intact. There is small (1-33%) pink granulation within the wound bed. There is a large (67-100%) amount of necrotic tissue within the wound bed including Adherent Slough. The periwound skin appearance exhibited: Scarring, Moist. The periwound skin appearance did not exhibit: Callus, Crepitus, Excoriation, Fluctuance, Friable, Induration, Localized Edema, Rash, Dry/Scaly, Maceration, Atrophie Blanche, Cyanosis, Ecchymosis, Hemosiderin Staining, Mottled, Pallor, Rubor, Erythema. Periwound temperature was noted as No Abnormality. The periwound has tenderness on palpation. The right scapular region has a fairly deep ulceration which is undermined between the 10:00 and 5:00 position.  There is a thick layer of Debbie over the base of this ulcer and this need sharp debridement. In the sacral region she has what looks like a previous skin graft and scar on the sacrum and at about the NAINIKA, NEWLUN. ([id number]) 6:00 position there is some bone protruding with superficial ulceration. She also has ulceration at about the 1:00 position. There is no purulence to this. Assessment Active Problems ICD-10 L89.114 - Pressure ulcer of right upper back, stage 4 L89.154 - Pressure ulcer of sacral region, stage 4 G82.52 - Quadriplegia, C1-C4 incomplete E44.0 - Moderate protein-calorie malnutrition For the lesion in the sacral area I would recommend silver alginate packing to be done lightly. She can also have silver alginate on the sacral wounds. She will need some debridement of the protruding  bone and since she has a plastic surgery opinion at [facility] I will defer until they see her. We have discussed offloading at great length, nutritional support, multivitamin support and continuing wound care. She will see me back in a week's time. Procedures Wound #1 Wound #1 is a Pressure Ulcer located on the Right Scapula . There was a Skin/Subcutaneous Tissue Debridement ([id number]) debridement with total area of 1.04 sq cm performed by Tristan Schroeder., MD. with the following instrument(s): Curette including Exudate, Fat, Fibrin/Slough, and Subcutaneous after achieving pain control using Lidocaine 4% Topical Solution. A time out was conducted prior to the start of the procedure. A Moderate amount of bleeding was controlled with Pressure. The procedure was tolerated well with a pain level of 0 throughout and a pain level of 0 following the procedure. Post Debridement Measurements: 1.3cm length x 0.8cm width x 0.7cm depth; 0.572cm^3 volume. Post debridement Stage noted as Category/Stage IV. Plan Wound Cleansing: Wound #1 Right Scapula: Cleanse wound with mild soap and water May Shower, gently pat wound dry prior to applying new dressing. Wound #2 Midline Sacrum: JANELLY, SWITALSKI. ([id number]) Cleanse wound with mild soap and water May Shower, gently pat wound dry prior to applying new dressing. Skin Barriers/Peri-Wound Care: Wound #1 Right Scapula: Barrier cream Wound #2 Midline Sacrum: Barrier cream Primary Wound Dressing: Wound #1 Right Scapula: Aquacel Ag Wound #2 Midline Sacrum: Aquacel Ag Secondary Dressing: Wound #1 Right Scapula: Boardered Foam Dressing Wound #2 Midline Sacrum: Boardered Foam Dressing Dressing Change Frequency: Wound #1 Right Scapula: Change dressing every other day. Wound #2 Midline Sacrum: Change dressing every other day. Follow-up Appointments: Wound #1 Right Scapula: Return Appointment in 1 week. Wound #2 Midline Sacrum: Return  Appointment in 2 weeks. Off-Loading: Wound #1 Right Scapula: Gel wheelchair cushion Roho cushion for wheelchair Turn and reposition every 2 hours Wound #2 Midline Sacrum: Gel wheelchair cushion Roho cushion for wheelchair Turn and reposition every 2 hours Home Health: Wound #1 Right Scapula: Continue [facility] Visits - [facility] Home Health Nurse may visit PRN to address patient s wound care needs. FACE TO FACE ENCOUNTER: MEDICARE and MEDICAID PATIENTS: I certify that this patient is under my care and that I had a face-to-face encounter that meets the physician face-to-face encounter requirements with this patient on this date. The encounter with the patient was in whole or in part for the following MEDICAL CONDITION: (primary reason for Home Healthcare) MEDICAL NECESSITY: I certify, that based on my findings, NURSING services are a medically necessary home health service. HOME BOUND STATUS: I certify that my clinical findings support that this patient is homebound (i.e., Due to illness or injury, pt requires  aid of supportive devices such as crutches, cane, wheelchairs, walkers, the use of special transportation or the assistance of another person to leave their place of residence. There is a normal inability to leave the home and doing so requires considerable and taxing effort. Other absences are for medical reasons / religious services and are infrequent or of short duration when for other reasons). If current dressing causes regression in wound condition, may D/C ordered dressing product/s and apply GABBY, RACKERS. ([id number]) Normal Saline Moist Dressing daily until next Wound Healing Center / Other MD appointment. Notify Wound Healing Center of regression in wound condition at [phone]. Please direct any NON-WOUND related issues/requests for orders to patient's Primary Care Physician Wound #2 Midline Sacrum: [facility] Home Health Nurse may visit PRN to address patient s wound care needs. FACE TO FACE ENCOUNTER: MEDICARE and MEDICAID PATIENTS: I certify that this patient is under my care and that I had a face-to-face encounter that meets the physician face-to-face encounter requirements with this patient on this date. The encounter with the patient was in whole or in part for the following MEDICAL CONDITION: (primary reason for Home Healthcare) MEDICAL NECESSITY: I certify, that based on my findings, NURSING services are a medically necessary home health service. HOME BOUND STATUS: I certify that my clinical findings support that this patient is homebound (i.e., Due to illness or injury, pt requires aid of supportive devices such as crutches, cane, wheelchairs, walkers, the use of special transportation or the assistance of another person to leave their place of residence. There is a normal inability to leave the home and doing so requires considerable and taxing effort. Other absences are for medical reasons / religious services and are infrequent or of short duration when for other reasons). If current dressing causes regression in wound condition, may D/C ordered dressing product/s and apply Normal Saline Moist Dressing daily until next Wound Healing Center / Other MD appointment. Notify Wound Healing Center of regression in wound condition at [phone]. Please direct any NON-WOUND related issues/requests for orders to patient's Primary Care Physician Radiology ordered were: X-ray, other - Pelvic and sacram For the lesion in the sacral area I would recommend silver alginate packing to be done lightly. She can also have silver alginate on the sacral wounds. She will need some debridement of the protruding bone and since she has a plastic surgery opinion at [facility] I will defer until they see her. We have discussed offloading at great length, nutritional support, multivitamin support and  continuing wound care. She will see me back in a week's time. Electronic Signature(s) Signed: 02/05/2015 8:57:54 AM By: Evlyn Kanner MD, FACS Previous Signature: 02/05/2015 8:56:59 AM Version By: Evlyn Kanner MD, FACS Previous Signature: 02/04/2015 2:49:52 PM Version By: Evlyn Kanner MD, FACS Entered By: Evlyn Kanner on 02/05/2015 08:57:54 Willaims, [name] ([id number]) -------------------------------------------------------------------------------- SuperBill Details Patient Name: CAMBRI, PLOURDE. Date of Service: 02/04/2015 Medical Record Patient Account Number: [id number] Number: Afful, RN, BSN, Treating RN: Oct 18, 1965 (49 y.o. 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

## 2015-02-04 NOTE — Progress Notes (Signed)
ANALLELY, ROSELL ([id number]) Visit Report for 02/04/2015 Arrival Information Details Patient Name: [name], [name] Date of Service: 02/04/2015 1:45 PM Medical Record Number: [id number] Patient Account Number: [id number] Date of Birth/Sex: 1965/11/23 (49 y.o. Female) Treating RN: Afful, RN, BSN, [place] Sink Primary Care Physician: [place], JULIE Other Clinician: Referring Physician: Treating Physician/Extender: Rudene Re in Treatment: 1 Visit Information History Since Last Visit Added or deleted any medications: No Patient Arrived: Wheel Chair Any new allergies or adverse reactions: No Arrival Time: 13:55 Had a fall or experienced change in No activities of daily living that may affect Accompanied By: spouse risk of falls: Transfer Assistance: Manual Signs or symptoms of abuse/neglect since last No Patient Identification Verified: Yes visito Secondary Verification Process Yes Hospitalized since last visit: No Completed: Has Dressing in Place as Prescribed: Yes Patient Has Alerts: No Pain Present Now: No Electronic Signature(s) Signed: 02/04/2015 3:16:53 PM By: Elpidio Eric BSN, RN Entered By: Elpidio Eric on 02/04/2015 13:55:30 Forni, [name] ([id number]) -------------------------------------------------------------------------------- Encounter Discharge Information Details Patient Name: [name], [name] Date of Service: 02/04/2015 1:45 PM Medical Record Number: [id number] Patient Account Number: [id number] Date of Birth/Sex: 1965/11/15 (48 y.o. Female) Treating RN: Afful, RN, BSN, [place] Sink Primary Care Physician: [place], JULIE Other Clinician: Referring Physician: Treating Physician/Extender: Rudene Re in Treatment: 1 Encounter Discharge Information Items Discharge Pain Level: 0 Discharge Condition: Stable Ambulatory Status: Wheelchair Discharge Destination: Home Transportation: Private Auto Accompanied By: sig other Schedule Follow-up Appointment:  No Medication Reconciliation completed No and provided to Patient/Care Darryon Bastin: Provided on Clinical Summary of Care: 02/04/2015 Form Type Recipient Paper Patient CW Electronic Signature(s) Signed: 02/04/2015 2:47:13 PM By: Gwenlyn Perking Entered By: Gwenlyn Perking on 02/04/2015 14:47:13 Stevens, [name] ([id number]) -------------------------------------------------------------------------------- Lower Extremity Assessment Details Patient Name: [name] Date of Service: 02/04/2015 1:45 PM Medical Record Number: [id number] Patient Account Number: [id number] Date of Birth/Sex: 01-10-1966 (49 y.o. Female) Treating RN: Afful, RN, BSN, [place] Sink Primary Care Physician: [place], JULIE Other Clinician: Referring Physician: Treating Physician/Extender: Rudene Re in Treatment: 1 Electronic Signature(s) Signed: 02/04/2015 3:16:53 PM By: Elpidio Eric BSN, RN Entered By: Elpidio Eric on 02/04/2015 13:58:28 Altier, [name] ([id number]) -------------------------------------------------------------------------------- Multi Wound Chart Details Patient Name: [name], [name] Date of Service: 02/04/2015 1:45 PM Medical Record Number: [id number] Patient Account Number: [id number] Date of Birth/Sex: 1966-01-15 (49 y.o. Female) Treating RN: Clover Mealy, RN, BSN, [place] Sink Primary Care Physician: [place], JULIE Other Clinician: Referring Physician: Treating Physician/Extender: Rudene Re in Treatment: 1 Vital Signs Height(in): 63 Pulse(bpm): 74 Weight(lbs): 100 Blood Pressure 105/67 (mmHg): Body Mass Index(BMI): 18 Temperature(F): 98.5 Respiratory Rate 16 (breaths/min): Photos: [1:No Photos] [2:No Photos] [N/A:N/A] Wound Location: [1:Right Scapula] [2:Sacrum - Midline] [N/A:N/A] Wounding Event: [1:Pressure Injury] [2:Pressure Injury] [N/A:N/A] Primary Etiology: [1:Pressure Ulcer] [2:Pressure Ulcer] [N/A:N/A] Comorbid History: [1:Asthma, Chronic Obstructive Pulmonary Disease (COPD),  Paraplegia] [2:Asthma, Chronic Obstructive Pulmonary Disease (COPD), Paraplegia] [N/A:N/A] Date Acquired: [1:09/21/2014] [2:12/02/2007] [N/A:N/A] Weeks of Treatment: [1:1] [2:1] [N/A:N/A] Wound Status: [1:Open] [2:Open] [N/A:N/A] Measurements L x W x D 1.3x0.8x0.7 [2:3.6x4.5x0.1] [N/A:N/A] (cm) Area (cm) : [1:0.817] [2:12.723] [N/A:N/A] Volume (cm) : [1:0.572] [2:1.272] [N/A:N/A] % Reduction in Area: [1:33.30%] [2:-1587.40%] [N/A:N/A] % Reduction in Volume: 41.60% [2:-1596.00%] [N/A:N/A] Starting Position 1 11 (o'clock): Ending Position 1 [1:5] (o'clock): Maximum Distance 1 2 (cm): Undermining: [1:Yes] [2:No] [N/A:N/A] 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] [N/A:N/A] Exudate Amount: [1:Large] [2:Medium] [N/A:N/A] Exudate Type: [1:Purulent] [2:Serous] [N/A:N/A] Exudate Color: [1:yellow, brown, green] [2:amber] [N/A:N/A] Wound Margin: [1:Flat and Intact] [2:Flat and Intact] [N/A:N/A] Granulation Amount: [1:Large (67-100%)] [  2:Small (1-33%)] [N/A:N/A] Granulation Quality: Red, Pink Pink N/A Necrotic Amount: None Present (0%) Large (67-100%) N/A Exposed Structures: Bone: Yes Bone: Yes N/A Fascia: No Fascia: No Fat: No Fat: No Tendon: No Tendon: No Muscle: No Muscle: No Joint: No Joint: No Epithelialization: None None N/A Periwound Skin Texture: Edema: No Scarring: Yes N/A Excoriation: No Edema: No Induration: No Excoriation: No Callus: No Induration: No Crepitus: No Callus: No Fluctuance: No Crepitus: No Friable: No Fluctuance: No Rash: No Friable: No Scarring: No Rash: No Periwound Skin Moist: Yes Moist: Yes N/A Moisture: Maceration: No Maceration: No Dry/Scaly: No Dry/Scaly: No Periwound Skin Color: Atrophie Blanche: No Atrophie Blanche: No N/A Cyanosis: No Cyanosis: No Ecchymosis: No Ecchymosis: No Erythema: No Erythema: No Hemosiderin Staining: No Hemosiderin Staining: No Mottled: No Mottled: No Pallor: No Pallor:  No Rubor: No Rubor: No Temperature: No Abnormality No Abnormality N/A Tenderness on Yes Yes N/A Palpation: Wound Preparation: Ulcer Cleansing: Ulcer Cleansing: N/A Rinsed/Irrigated with Rinsed/Irrigated with Saline Saline Topical Anesthetic Topical Anesthetic Applied: Other: lidocaine Applied: Other: lidocaine 4% 4% Treatment Notes Electronic Signature(s) Signed: 02/04/2015 3:16:53 PM By: Elpidio Eric BSN, RN Entered By: Elpidio Eric on 02/04/2015 14:11:05 [name] ([id number]) -------------------------------------------------------------------------------- Multi-Disciplinary Care Plan Details Patient Name: [name], [name] Date of Service: 02/04/2015 1:45 PM Medical Record Number: [id number] Patient Account Number: [id number] Date of Birth/Sex: 01-24-1966 (49 y.o. Female) Treating RN: Afful, RN, BSN, [place] Sink Primary Care Physician: [place], JULIE Other Clinician: Referring Physician: Treating Physician/Extender: Rudene Re in Treatment: 1 Active Inactive Abuse / Safety / Falls / Self Care Management Nursing Diagnoses: Impaired home maintenance Impaired physical mobility Potential for falls Self care deficit: actual or potential Goals: Patient/caregiver will verbalize understanding of skin care regimen Date Initiated: 01/27/2015 Goal Status: Active Patient/caregiver will verbalize/demonstrate measure taken to improve self care Date Initiated: 01/27/2015 Goal Status: Active Patient/caregiver will verbalize/demonstrate measures taken to improve the patient's personal safety Date Initiated: 01/27/2015 Goal Status: Active Patient/caregiver will verbalize/demonstrate measures taken to prevent injury and/or falls Date Initiated: 01/27/2015 Goal Status: Active Patient/caregiver will verbalize/demonstrate understanding of what to do in case of emergency Date Initiated: 01/27/2015 Goal Status: Active Interventions: Assess fall risk on admission and as needed Assess:  immobility, friction, shearing, incontinence upon admission and as needed Assess impairment of mobility on admission and as needed per policy Assess self care needs on admission and as needed Provide education on basic hygiene Provide education on fall prevention Provide education on personal and home safety Provide education on safe transfers [name], [name] Notes: Orientation to the Wound Care Program Nursing Diagnoses: Knowledge deficit related to the wound healing center program Goals: Patient/caregiver will verbalize understanding of the Wound Healing Center Program Date Initiated: 01/27/2015 Goal Status: Active Interventions: Provide education on orientation to the wound center Notes: Pressure Nursing Diagnoses: Knowledge deficit related to causes and risk factors for pressure ulcer development Knowledge deficit related to management of pressures ulcers Potential for impaired tissue integrity related to pressure, friction, moisture, and shear Goals: Patient will remain free from development of additional pressure ulcers Date Initiated: 01/27/2015 Goal Status: Active Patient will remain free of pressure ulcers Date Initiated: 01/27/2015 Goal Status: Active Patient/caregiver will verbalize risk factors for pressure ulcer development Date Initiated: 01/27/2015 Goal Status: Active Patient/caregiver will verbalize understanding of pressure ulcer management Date Initiated: 01/27/2015 Goal Status: Active Interventions: Assess: immobility, friction, shearing, incontinence upon admission and as needed Assess offloading mechanisms upon admission and as needed Assess potential for pressure ulcer upon admission  and as needed Provide education on pressure ulcers Treatment Activities: Patient referred for pressure reduction/relief devices : 02/04/2015 [name], [name] ([id number]) Patient referred for seating evaluation to ensure proper offloading :  02/04/2015 Notes: Wound/Skin Impairment Nursing Diagnoses: Impaired tissue integrity Knowledge deficit related to smoking impact on wound healing Knowledge deficit related to ulceration/compromised skin integrity Goals: Patient/caregiver will verbalize understanding of skin care regimen Date Initiated: 01/27/2015 Goal Status: Active Ulcer/skin breakdown will have a volume reduction of 30% by week 4 Date Initiated: 01/27/2015 Goal Status: Active Ulcer/skin breakdown will have a volume reduction of 50% by week 8 Date Initiated: 01/27/2015 Goal Status: Active Ulcer/skin breakdown will have a volume reduction of 80% by week 12 Date Initiated: 01/27/2015 Goal Status: Active Ulcer/skin breakdown will heal within 14 weeks Date Initiated: 01/27/2015 Goal Status: Active Interventions: Assess ulceration(s) every visit Provide education on ulcer and skin care Notes: Electronic Signature(s) Signed: 02/04/2015 3:16:53 PM By: Elpidio Eric BSN, RN Entered By: Elpidio Eric on 02/04/2015 14:10:47 [name], [name] ([id number]) -------------------------------------------------------------------------------- Pain Assessment Details Patient Name: [name] Date of Service: 02/04/2015 1:45 PM Medical Record Number: [id number] Patient Account Number: [id number] Date of Birth/Sex: 02/03/1966 (49 y.o. Female) Treating RN: Clover Mealy, RN, BSN, [place] Sink Primary Care Physician: [place], JULIE Other Clinician: Referring Physician: Treating Physician/Extender: Rudene Re in Treatment: 1 Active Problems Location of Pain Severity and Description of Pain Patient Has Paino No Site Locations Pain Management and Medication Current Pain Management: Electronic Signature(s) Signed: 02/04/2015 3:16:53 PM By: Elpidio Eric BSN, RN Entered By: Elpidio Eric on 02/04/2015 13:55:37 Cuffie, [name] ([id number]) -------------------------------------------------------------------------------- Patient/Caregiver  Education Details Patient Name: [name], [name] Date of Service: 02/04/2015 1:45 PM Medical Record Number: [id number] Patient Account Number: [id number] Date of Birth/Gender: Jun 09, 1966 (49 y.o. Female) Treating RN: Afful, RN, BSN, [place] Sink Primary Care Physician: [place], JULIE Other Clinician: Referring Physician: Treating Physician/Extender: Rudene Re in Treatment: 1 Education Assessment Education Provided To: Patient Education Topics Provided Basic Hygiene: Methods: Explain/Verbal Responses: State content correctly Pressure: Methods: Explain/Verbal Responses: State content correctly Safety: Methods: Explain/Verbal Responses: State content correctly Welcome To The Wound Care Center: Methods: Explain/Verbal Responses: State content correctly Wound/Skin Impairment: Methods: Explain/Verbal Responses: State content correctly Electronic Signature(s) Signed: 02/04/2015 3:16:53 PM By: Elpidio Eric BSN, RN Entered By: Elpidio Eric on 02/04/2015 14:31:44 [name], [name] ([id number]) -------------------------------------------------------------------------------- Wound Assessment Details Patient Name: [name] Date of Service: 02/04/2015 1:45 PM Medical Record Number: [id number] Patient Account Number: [id number] Date of Birth/Sex: 1966-02-02 (49 y.o. Female) Treating RN: Afful, RN, BSN, Psychologist, clinical Primary Care Physician: [place], JULIE Other Clinician: Referring Physician: Treating Physician/Extender: Rudene Re in Treatment: 1 Wound Status Wound Number: 1 Primary Pressure Ulcer Etiology: Wound Location: Right Scapula Wound Open Wounding Event: Pressure Injury Status: Date Acquired: 09/21/2014 Comorbid Asthma, Chronic Obstructive Weeks Of Treatment: 1 History: Pulmonary Disease (COPD), Clustered Wound: No Paraplegia Photos Photo Uploaded By: Elpidio Eric on 02/04/2015 15:10:11 Wound Measurements Length: (cm) 1.3 Width: (cm) 0.8 Depth: (cm) 0.7 Area:  (cm) 0.817 Volume: (cm) 0.572 % Reduction in Area: 33.3% % Reduction in Volume: 41.6% Epithelialization: None Tunneling: No Undermining: Yes Starting Position (o'clock): 11 Ending Position (o'clock): 5 Maximum Distance: (cm) 2 Wound Description Classification: Category/Stage IV Wound Margin: Flat and Intact Exudate Amount: Large Exudate Type: Purulent Exudate Color: yellow, brown, green Foul Odor After Cleansing: No Wound Bed Carreker, Elene M. ([id number]) Granulation Amount: Large (67-100%) Exposed Structure Granulation Quality: Red, Pink Fascia Exposed: No Necrotic Amount: None Present (0%) Fat Layer Exposed:  No Tendon Exposed: No Muscle Exposed: No Joint Exposed: No Bone Exposed: Yes Periwound Skin Texture Texture Color No Abnormalities Noted: No No Abnormalities Noted: No Callus: No Atrophie Blanche: No Crepitus: No Cyanosis: No Excoriation: No Ecchymosis: No Fluctuance: No Erythema: No Friable: No Hemosiderin Staining: No Induration: No Mottled: No Localized Edema: No Pallor: No Rash: No Rubor: No Scarring: No Temperature / Pain Moisture Temperature: No Abnormality No Abnormalities Noted: No Tenderness on Palpation: Yes Dry / Scaly: No Maceration: No Moist: Yes Wound Preparation Ulcer Cleansing: Rinsed/Irrigated with Saline Topical Anesthetic Applied: Other: lidocaine 4%, Treatment Notes Wound #1 (Right Scapula) 1. Cleansed with: Clean wound with Normal Saline 2. Anesthetic Topical Lidocaine 4% cream to wound bed prior to debridement 4. Dressing Applied: Aquacel Ag 5. Secondary Dressing Applied Bordered Foam Dressing Electronic Signature(s) Signed: 02/04/2015 3:16:53 PM By: Elpidio Eric BSN, RN Entered By: Elpidio Eric on 02/04/2015 14:10:14 [name], [name] ([id number]) -------------------------------------------------------------------------------- Wound Assessment Details Patient Name: [name], [name] Date of Service: 02/04/2015  1:45 PM Medical Record Number: [id number] Patient Account Number: [id number] Date of Birth/Sex: Jun 01, 1966 (49 y.o. Female) Treating RN: Afful, RN, BSN, Psychologist, clinical Primary Care Physician: [place], JULIE Other Clinician: Referring Physician: Treating Physician/Extender: Rudene Re in Treatment: 1 Wound Status Wound Number: 2 Primary Pressure Ulcer Etiology: Wound Location: Sacrum - Midline Wound Open Wounding Event: Pressure Injury Status: Date Acquired: 12/02/2007 Comorbid Asthma, Chronic Obstructive Weeks Of Treatment: 1 History: Pulmonary Disease (COPD), Clustered Wound: No Paraplegia Photos Photo Uploaded By: Elpidio Eric on 02/04/2015 15:10:12 Wound Measurements Length: (cm) 3.6 Width: (cm) 4.5 Depth: (cm) 0.1 Area: (cm) 12.723 Volume: (cm) 1.272 % Reduction in Area: -1587.4% % Reduction in Volume: -1596% Epithelialization: None Tunneling: No Undermining: No Wound Description Classification: Category/Stage IV Wound Margin: Flat and Intact Exudate Amount: Medium Exudate Type: Serous Exudate Color: amber Foul Odor After Cleansing: No Wound Bed Granulation Amount: Small (1-33%) Exposed Structure Granulation Quality: Pink Fascia Exposed: No Necrotic Amount: Large (67-100%) Fat Layer Exposed: No Necrotic Quality: Adherent Slough Tendon Exposed: No Snuffer, Philomena M. ([id number]) Muscle Exposed: No Joint Exposed: No Bone Exposed: Yes Periwound Skin Texture Texture Color No Abnormalities Noted: No No Abnormalities Noted: No Callus: No Atrophie Blanche: No Crepitus: No Cyanosis: No Excoriation: No Ecchymosis: No Fluctuance: No Erythema: No Friable: No Hemosiderin Staining: No Induration: No Mottled: No Localized Edema: No Pallor: No Rash: No Rubor: No Scarring: Yes Temperature / Pain Moisture Temperature: No Abnormality No Abnormalities Noted: No Tenderness on Palpation: Yes Dry / Scaly: No Maceration: No Moist: Yes Wound Preparation Ulcer  Cleansing: Rinsed/Irrigated with Saline Topical Anesthetic Applied: Other: lidocaine 4%, Treatment Notes Wound #2 (Midline Sacrum) 1. Cleansed with: Clean wound with Normal Saline 2. Anesthetic Topical Lidocaine 4% cream to wound bed prior to debridement 4. Dressing Applied: Aquacel Ag 5. Secondary Dressing Applied Bordered Foam Dressing Electronic Signature(s) Signed: 02/04/2015 3:16:53 PM By: Elpidio Eric BSN, RN Entered By: Elpidio Eric on 02/04/2015 14:10:33 Alig, [name] ([id number]) -------------------------------------------------------------------------------- Vitals Details Patient Name: RANEE, PEASLEY. Date of Service: 02/04/2015 1:45 PM Medical Record Number: [id number] Patient Account Number: [id number] Date of Birth/Sex: [birth year] (49 y.o. Female) Treating RN: Afful, RN, BSN, [place] Sink Primary Care Physician: [place], JULIE Other Clinician: Referring Physician: Treating Physician/Extender: Rudene Re in Treatment: 1 Vital Signs Time Taken: 13:58 Temperature (F): 98.5 Height (in): 63 Pulse (bpm): 74 Weight (lbs): 100 Respiratory Rate (breaths/min): 16 Body Mass Index (BMI): 17.7 Blood Pressure (mmHg): 105/67 Reference Range: 80 - 120 mg / dl Electronic Signature(s)  Signed: 02/04/2015 3:16:53 PM By: Elpidio Eric BSN, RN Entered By: Elpidio Eric on 02/04/2015 13:58:16

## 2015-02-11 ENCOUNTER — Ambulatory Visit
Admission: RE | Admit: 2015-02-11 | Discharge: 2015-02-11 | Disposition: A | Discharge status: Home / Self Care | Payer: Medicare Other | Source: Ambulatory Visit | Attending: Surgery | Admitting: Surgery

## 2015-02-11 ENCOUNTER — Other Ambulatory Visit: Payer: Self-pay | Admitting: Surgery

## 2015-02-11 ENCOUNTER — Encounter: Payer: Medicare Other | Admitting: Surgery

## 2015-02-11 DIAGNOSIS — S81809A Unspecified open wound, unspecified lower leg, initial encounter: Secondary | ICD-10-CM | POA: Diagnosis present

## 2015-02-11 DIAGNOSIS — M869 Osteomyelitis, unspecified: Secondary | ICD-10-CM

## 2015-02-11 DIAGNOSIS — L89114 Pressure ulcer of right upper back, stage 4: Secondary | ICD-10-CM | POA: Diagnosis not present

## 2015-02-11 DIAGNOSIS — X58XXXA Exposure to other specified factors, initial encounter: Secondary | ICD-10-CM | POA: Insufficient documentation

## 2015-02-11 DIAGNOSIS — M160 Bilateral primary osteoarthritis of hip: Secondary | ICD-10-CM | POA: Insufficient documentation

## 2015-02-12 NOTE — Progress Notes (Signed)
MACY, POLIO ([id number]) Visit Report for 02/11/2015 Chief Complaint Document Details Patient Name: [name], [name] Date of Service: 02/11/2015 3:15 PM Medical Record Patient Account Number: [id number] Number: Afful, RN, BSN, Treating RN: 01/19/1966 (49 y.o. [name] Date of Birth/Sex: Female) Other Clinician: Primary Care Physician: [place], JULIE Treating Evlyn Kanner Referring Physician: [place], JULIE Physician/Extender: Weeks in Treatment: 2 Information Obtained from: Patient Chief Complaint Patient presents to the wound care center for a consult due non healing wound. 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 She comes in for a pressure injury to the right scapular region and the sacral region. She has this for several months. Electronic Signature(s) Signed: 02/11/2015 3:47:55 PM By: Evlyn Kanner MD, FACS Entered By: Evlyn Kanner on 02/11/2015 15:47:55 Karle, [name] ([id number]) -------------------------------------------------------------------------------- Debridement Details Patient Name: [name], [name] Date of Service: 02/11/2015 3:15 PM Medical Record Patient Account Number: [id number] Number: Afful, RN, BSN, Treating RN: 1965/11/11 (49 y.o. [name] Date of Birth/Sex: Female) Other Clinician: Primary Care Physician: [place], JULIE Treating Gabryel Files Referring Physician: [place], JULIE Physician/Extender: Weeks in Treatment: 2 Debridement Performed for Wound #1 Right Scapula Assessment: Performed By: Physician Tristan Schroeder., MD Debridement: Debridement Pre-procedure Yes Verification/Time Out Taken: Start Time: 15:40 Pain Control: Lidocaine 4% Topical Solution Level: Skin/Subcutaneous Tissue Total Area Debrided (L x 1.5 (cm) x 1 (cm) = 1.5 (cm) W): Tissue and other Non-Viable, Fibrin/Slough, Subcutaneous material debrided: Instrument: Curette Bleeding:  Minimum Hemostasis Achieved: Pressure End Time: 15:45 Procedural Pain: 0 Post Procedural Pain: 0 Response to Treatment: Procedure was tolerated well Post Debridement Measurements of Total Wound Length: (cm) 1.5 Stage: Category/Stage IV Width: (cm) 1 Depth: (cm) 0.7 Volume: (cm) 0.825 Electronic Signature(s) Signed: 02/11/2015 3:47:49 PM By: Evlyn Kanner MD, FACS Signed: 02/11/2015 4:29:43 PM By: Elpidio Eric BSN, RN Entered By: Evlyn Kanner on 02/11/2015 15:47:49 Beirne, Rainelle Judie Petit ([id number]) -------------------------------------------------------------------------------- HPI Details Patient Name: [name], [name] Date of Service: 02/11/2015 3:15 PM Medical Record Patient Account Number: [id number] Number: Afful, RN, BSN, Treating RN: [birth year] (49 y.o. [name] Date of Birth/Sex: Female) Other Clinician: Primary Care Physician: [place], JULIE Treating Evlyn Kanner Referring Physician: [place], JULIE Physician/Extender: Weeks in Treatment: 2 History of Present Illness HPI Description: 49 year old patient was had quadriparesis for the last 7 years and has had previous extensive surgery and reconstruction at [facility]. For about 4-5 months she's had a decubitus ulcer on the right scapular region and she has a couple of small areas which have opened out in the region of previous scar tissue in the sacral region. He does not have any other significant comorbidities. She has a good Roho cushion for her wheelchair and she has a specialized mattress for sleep. She has stopped smoking a year ago. 02/11/2015 -- she has not yet got her appointment with the plastic surgeons at [facility]. They did a x-ray of the sacral region this afternoon just before coming here. Electronic Signature(s) Signed: 02/11/2015 4:04:10 PM By: Evlyn Kanner MD, FACS Previous Signature: 02/11/2015 3:51:40 PM Version By: Evlyn Kanner MD, FACS Entered By: Evlyn Kanner on 02/11/2015  16:04:09 [name] ([id number]) -------------------------------------------------------------------------------- Physical Exam Details Patient Name: [name], [name] Date of Service: 02/11/2015 3:15 PM Medical Record Patient Account Number: [id number] Number: Afful, RN, BSN, Treating RN: 04-12-1966 (49 y.o. [name] Date of Birth/Sex: Female) Other Clinician: Primary Care Physician: [place], JULIE Treating Evlyn Kanner Referring Physician: [place], JULIE Physician/Extender: Weeks in  Treatment: 2 Constitutional . Pulse regular. Respirations normal and unlabored. Afebrile. . Eyes Nonicteric. Reactive to light. Ears, Nose, Mouth, and Throat Lips, teeth, and gums WNL.Marland Kitchen Moist mucosa without lesions . Neck supple and nontender. No palpable supraclavicular or cervical adenopathy. Normal sized without goiter. Respiratory WNL. No retractions.. Cardiovascular Pedal Pulses WNL. No clubbing, cyanosis or edema. Lymphatic No adneopathy. No adenopathy. No adenopathy. Musculoskeletal Adexa without tenderness or enlargement.. Digits and nails w/o clubbing, cyanosis, infection, petechiae, ischemia, or inflammatory conditions.. Integumentary (Hair, Skin) No suspicious lesions. No crepitus or fluctuance. No peri-wound warmth or erythema. No masses.Marland Kitchen Psychiatric Judgement and insight Intact.. No evidence of depression, anxiety, or agitation.. Notes The right scapular region continues to have some slough which need sharp debridement and the undermining persists. The sacral region continues to have minimal protrusion of bone at the superficial area near the lower part of the wound. Electronic Signature(s) Signed: 02/11/2015 4:05:29 PM By: Evlyn Kanner MD, FACS Entered By: Evlyn Kanner on 02/11/2015 16:05:29 [name] ([id number]) -------------------------------------------------------------------------------- Physician Orders Details Patient Name: [name], [name] Date of Service: 02/11/2015 3:15 PM Medical Record Patient Account Number: [id number] Number: Afful, RN, BSN, Treating RN: [birth year] (49 y.o. [place] Sink Date of Birth/Sex: Female) Other Clinician: Primary Care Physician: [place], JULIE Treating Evlyn Kanner Referring Physician: [place], JULIE Physician/Extender: Tania Ade in Treatment: 2 Verbal / Phone Orders: Yes Clinician: Afful, RN, BSN, Rita Read Back and Verified: Yes Diagnosis Coding Wound Cleansing Wound #1 Right Scapula o Cleanse wound with mild soap and water o May Shower, gently pat wound dry prior to applying new dressing. Wound #2 Midline Sacrum o Cleanse wound with mild soap and water o May Shower, gently pat wound dry prior to applying new dressing. Skin Barriers/Peri-Wound Care Wound #1 Right Scapula o Barrier cream Wound #2 Midline Sacrum o Barrier cream Primary Wound Dressing Wound #1 Right Scapula o Aquacel Ag - rope Wound #2 Midline Sacrum o Aquacel Ag Secondary Dressing Wound #1 Right Scapula o Boardered Foam Dressing Wound #2 Midline Sacrum o Boardered Foam Dressing Dressing Change Frequency Wound #1 Right Scapula o Change dressing every other day. [name], [name] ([id number]) Wound #2 Midline Sacrum o Change dressing every other day. Follow-up Appointments Wound #1 Right Scapula o Return Appointment in 1 week. Wound #2 Midline Sacrum o Return Appointment in 2 weeks. Off-Loading Wound #1 Right Scapula o Gel wheelchair cushion o Roho cushion for wheelchair o Turn and reposition every 2 hours Wound #2 Midline Sacrum o Gel wheelchair cushion o Roho cushion for wheelchair o Turn and reposition every 2 hours Home Health Wound #1 Right Scapula o Continue Home Health Visits - [facility] o Home Health Nurse may visit PRN to address patientos wound care needs. o FACE TO FACE ENCOUNTER: MEDICARE and MEDICAID PATIENTS: I certify that  this patient is under my care and that I had a face-to-face encounter that meets the physician face-to-face encounter requirements with this patient on this date. The encounter with the patient was in whole or in part for the following MEDICAL CONDITION: (primary reason for Home Healthcare) MEDICAL NECESSITY: I certify, that based on my findings, NURSING services are a medically necessary home health service. HOME BOUND STATUS: I certify that my clinical findings support that this patient is homebound (i.e., Due to illness or injury, pt requires aid of supportive devices such as crutches, cane, wheelchairs, walkers, the use of special transportation or the assistance of another person to leave their place of residence. There is a  normal inability to leave the home and doing so requires considerable and taxing effort. Other absences are for medical reasons / religious services and are infrequent or of short duration when for other reasons). o If current dressing causes regression in wound condition, may D/C ordered dressing product/s and apply Normal Saline Moist Dressing daily until next Wound Healing Center / Other MD appointment. Notify Wound Healing Center of regression in wound condition at [phone]. o Please direct any NON-WOUND related issues/requests for orders to patient's Primary Care Physician Wound #2 Midline Sacrum o Continue Home Health Visits - [facility] o Home Health Nurse may visit PRN to address patientos wound care needs. [name], [name] ([id number]) o FACE TO FACE ENCOUNTER: MEDICARE and MEDICAID PATIENTS: I certify that this patient is under my care and that I had a face-to-face encounter that meets the physician face-to-face encounter requirements with this patient on this date. The encounter with the patient was in whole or in part for the following MEDICAL CONDITION: (primary reason for Home Healthcare) MEDICAL NECESSITY: I certify, that based  on my findings, NURSING services are a medically necessary home health service. HOME BOUND STATUS: I certify that my clinical findings support that this patient is homebound (i.e., Due to illness or injury, pt requires aid of supportive devices such as crutches, cane, wheelchairs, walkers, the use of special transportation or the assistance of another person to leave their place of residence. There is a normal inability to leave the home and doing so requires considerable and taxing effort. Other absences are for medical reasons / religious services and are infrequent or of short duration when for other reasons). o If current dressing causes regression in wound condition, may D/C ordered dressing product/s and apply Normal Saline Moist Dressing daily until next Wound Healing Center / Other MD appointment. Notify Wound Healing Center of regression in wound condition at [phone]. o Please direct any NON-WOUND related issues/requests for orders to patient's Primary Care Physician Electronic Signature(s) Signed: 02/11/2015 3:47:10 PM By: Elpidio Eric BSN, RN Signed: 02/11/2015 4:11:41 PM By: Evlyn Kanner MD, FACS Entered By: Elpidio Eric on 02/11/2015 15:47:10 [name], [name] ([id number]) -------------------------------------------------------------------------------- Problem List Details Patient Name: [name], [name] Date of Service: 02/11/2015 3:15 PM Medical Record Patient Account Number: [id number] Number: Afful, RN, BSN, Treating RN: 07-Apr-1966 (49 y.o. 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 Date of Service: 02/11/2015 3:15 PM Medical Record Patient Account Number: [id number] Number: Afful, RN, BSN, Treating RN: May 26, 1966 (49 y.o. [place] Sink Date of Birth/Sex: Female) Other Clinician: Primary Care Physician: [place], Raynelle Fanning Treating Evlyn Kanner Referring Physician: [place], JULIE Physician/Extender: Tania Ade in Treatment: 2 Subjective Chief Complaint Information obtained from Patient Patient presents to the wound care center for a consult due non healing wound. 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 She comes in for a pressure injury to the right scapular region and the  sacral region. She has this for several months. 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 For about 4-5 months she's had a decubitus ulcer on the right scapular region and she has a couple of small areas which have opened out in the region of previous scar tissue in the sacral region. He does not have any other significant comorbidities. She has a good Roho cushion for her wheelchair and she has a specialized mattress for sleep. She has stopped smoking a year ago. 02/11/2015 -- she has not yet got her appointment with the plastic surgeons at [facility]. They did a x-ray of the sacral region this afternoon just before coming here. Objective Constitutional Pulse regular. Respirations normal  and unlabored. Afebrile. Vitals Time Taken: 3:24 PM, Height: 63 in, Weight: 100 lbs, BMI: 17.7, Temperature: 97.8 F, Pulse: 78 bpm, Respiratory Rate: 16 breaths/min, Blood Pressure: 116/74 mmHg. Eyes Nonicteric. Reactive to light. Ears, Nose, Mouth, and Throat Lips, teeth, and gums WNL.Marland Kitchen Moist mucosa without lesions . [name], [name] ([id number]) Neck supple and nontender. No palpable supraclavicular or cervical adenopathy. Normal sized without goiter. Respiratory WNL. No retractions.. Cardiovascular Pedal Pulses WNL. No clubbing, cyanosis or edema. Lymphatic No adneopathy. No adenopathy. No adenopathy. Musculoskeletal Adexa without tenderness or enlargement.. Digits and nails w/o clubbing, cyanosis, infection, petechiae, ischemia, or inflammatory conditions.Marland Kitchen Psychiatric Judgement and insight Intact.. No evidence of depression, anxiety, or agitation.. General Notes: The right scapular region continues to have some slough which need sharp debridement and the undermining persists. The sacral region continues to have minimal protrusion of bone at the superficial area near the lower part of the wound. Integumentary (Hair, Skin) No suspicious lesions. No crepitus or fluctuance. No peri-wound warmth or erythema. No masses.. Wound #1 status is Open. Original cause of wound was Pressure Injury. The wound is located on the Right Scapula. The wound measures 1.5cm length x 1cm width x 0.7cm depth; 1.178cm^2 area and 0.825cm^3 volume. There is bone exposed. There is no tunneling noted, however, there is undermining starting at 10:00 and ending at 6:00 with a maximum distance of 4cm. There is a large amount of purulent drainage noted. The wound margin is flat and intact. There is large (67-100%) red, pink granulation within the wound bed. There is no necrotic tissue within the wound bed. The periwound skin appearance exhibited: Moist. The periwound skin appearance did not exhibit: Callus,  Crepitus, Excoriation, Fluctuance, Friable, Induration, Localized Edema, Rash, Scarring, Dry/Scaly, Maceration, Atrophie Blanche, Cyanosis, Ecchymosis, Hemosiderin Staining, Mottled, Pallor, Rubor, Erythema. Periwound temperature was noted as No Abnormality. The periwound has tenderness on palpation. Wound #2 status is Open. Original cause of wound was Pressure Injury. The wound is located on the Midline Sacrum. The wound measures 4cm length x 5cm width x 0.1cm depth; 15.708cm^2 area and 1.571cm^3 volume. There is bone exposed. There is no tunneling or undermining noted. There is a medium amount of serous drainage noted. The wound margin is flat and intact. There is small (1-33%) pink granulation within the wound bed. There is a large (67-100%) amount of necrotic tissue within the wound bed including Adherent Slough. The periwound skin appearance exhibited: Scarring, Moist. The periwound skin appearance did not exhibit: Callus, Crepitus, Excoriation, Fluctuance, Friable, Induration, Localized Edema, Rash, Dry/Scaly, Maceration, Atrophie Blanche, Cyanosis, Ecchymosis, Hemosiderin Staining, Mottled, Pallor, Rubor, Erythema. Periwound temperature was noted as No Abnormality. The periwound has tenderness on  palpation. [name], [name] ([id number]) Assessment Active Problems ICD-10 L89.114 - Pressure ulcer of right upper back, stage 4 L89.154 - Pressure ulcer of sacral region, stage 4 G82.52 - Quadriplegia, C1-C4 incomplete E44.0 - Moderate protein-calorie malnutrition The right scapular region is going to be packed with silver alginate rope and we would use silver alginate on the sacral ulceration too. The x-ray results are not yet in and she will continue to work on getting her appointment with the plastic surgery team at [facility]. Procedures Wound #1 Wound #1 is a Pressure Ulcer located on the Right Scapula . There was a Skin/Subcutaneous Tissue Debridement ([id number]) debridement  with total area of 1.5 sq cm performed by Tristan Schroeder., MD. with the following instrument(s): Curette to remove Non-Viable tissue/material including Fibrin/Slough and Subcutaneous after achieving pain control using Lidocaine 4% Topical Solution. A time out was conducted prior to the start of the procedure. A Minimum amount of bleeding was controlled with Pressure. The procedure was tolerated well with a pain level of 0 throughout and a pain level of 0 following the procedure. Post Debridement Measurements: 1.5cm length x 1cm width x 0.7cm depth; 0.825cm^3 volume. Post debridement Stage noted as Category/Stage IV. Plan Wound Cleansing: Wound #1 Right Scapula: Cleanse wound with mild soap and water May Shower, gently pat wound dry prior to applying new dressing. Wound #2 Midline Sacrum: Cleanse wound with mild soap and water May Shower, gently pat wound dry prior to applying new dressing. Skin Barriers/Peri-Wound Care: Wound #1 Right Scapula: LALANIA, HASEMAN. ([id number]) Barrier cream Wound #2 Midline Sacrum: Barrier cream Primary Wound Dressing: Wound #1 Right Scapula: Aquacel Ag - rope Wound #2 Midline Sacrum: Aquacel Ag Secondary Dressing: Wound #1 Right Scapula: Boardered Foam Dressing Wound #2 Midline Sacrum: Boardered Foam Dressing Dressing Change Frequency: Wound #1 Right Scapula: Change dressing every other day. Wound #2 Midline Sacrum: Change dressing every other day. Follow-up Appointments: Wound #1 Right Scapula: Return Appointment in 1 week. Wound #2 Midline Sacrum: Return Appointment in 2 weeks. Off-Loading: Wound #1 Right Scapula: Gel wheelchair cushion Roho cushion for wheelchair Turn and reposition every 2 hours Wound #2 Midline Sacrum: Gel wheelchair cushion Roho cushion for wheelchair Turn and reposition every 2 hours Home Health: Wound #1 Right Scapula: Continue Home Health Visits - [facility] Home Health Nurse may visit PRN to  address patient s wound care needs. FACE TO FACE ENCOUNTER: MEDICARE and MEDICAID PATIENTS: I certify that this patient is under my care and that I had a face-to-face encounter that meets the physician face-to-face encounter requirements with this patient on this date. The encounter with the patient was in whole or in part for the following MEDICAL CONDITION: (primary reason for Home Healthcare) MEDICAL NECESSITY: I certify, that based on my findings, NURSING services are a medically necessary home health service. HOME BOUND STATUS: I certify that my clinical findings support that this patient is homebound (i.e., Due to illness or injury, pt requires aid of supportive devices such as crutches, cane, wheelchairs, walkers, the use of special transportation or the assistance of another person to leave their place of residence. There is a normal inability to leave the home and doing so requires considerable and taxing effort. Other absences are for medical reasons / religious services and are infrequent or of short duration when for other reasons). If current dressing causes regression in wound condition, may D/C ordered dressing product/s and apply Normal Saline Moist Dressing daily until next Wound Healing Center /  Other MD appointment. Notify Wound Healing Center of regression in wound condition at [phone]. Please direct any NON-WOUND related issues/requests for orders to patient's Primary Care Physician Wound #2 Midline Sacrum: SHARDEA, CWYNAR ([id number]) Continue Home Health Visits - [facility] Nurse may visit PRN to address patient s wound care needs. FACE TO FACE ENCOUNTER: MEDICARE and MEDICAID PATIENTS: I certify that this patient is under my care and that I had a face-to-face encounter that meets the physician face-to-face encounter requirements with this patient on this date. The encounter with the patient was in whole or in part for the following MEDICAL  CONDITION: (primary reason for Home Healthcare) MEDICAL NECESSITY: I certify, that based on my findings, NURSING services are a medically necessary home health service. HOME BOUND STATUS: I certify that my clinical findings support that this patient is homebound (i.e., Due to illness or injury, pt requires aid of supportive devices such as crutches, cane, wheelchairs, walkers, the use of special transportation or the assistance of another person to leave their place of residence. There is a normal inability to leave the home and doing so requires considerable and taxing effort. Other absences are for medical reasons / religious services and are infrequent or of short duration when for other reasons). If current dressing causes regression in wound condition, may D/C ordered dressing product/s and apply Normal Saline Moist Dressing daily until next Wound Healing Center / Other MD appointment. Notify Wound Healing Center of regression in wound condition at [phone]. Please direct any NON-WOUND related issues/requests for orders to patient's Primary Care Physician The right scapular region is going to be packed with silver alginate rope and we would use silver alginate on the sacral ulceration too. The x-ray results are not yet in and she will continue to work on getting her appointment with the plastic surgery team at [facility]. Electronic Signature(s) Signed: 02/11/2015 4:06:38 PM By: Evlyn Kanner MD, FACS Entered By: Evlyn Kanner on 02/11/2015 16:06:37 Korus, [name] ([id number]) -------------------------------------------------------------------------------- SuperBill Details Patient Name: MOLLEY, HOUSER. Date of Service: 02/11/2015 Medical Record Patient Account Number: [id number] Number: Afful, RN, BSN, Treating RN: 09/23/1965 (49 y.o. 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, stage 4 L89.154 Pressure ulcer of sacral region, stage 4 G82.52 Quadriplegia, C1-C4 incomplete E44.0 Moderate protein-calorie  malnutrition Modifier: Quantity: 1 Physician Procedures CPT4 Code: 7322025 Description: 11042 - WC PHYS SUBQ TISS 20 SQ CM ICD-10 Description Diagnosis 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

## 2015-02-12 NOTE — Progress Notes (Signed)
[name] ([id number]) Visit Report for 02/11/2015 Arrival Information Details Patient Name: [name], [name] Date of Service: 02/11/2015 3:15 PM Medical Record Number: [id number] Patient Account Number: [id number] Date of Birth/Sex: [birth year] (49 y.o. Female) Treating [name]: [name], [name] Primary Care Physician: [name] Other Clinician: Referring Physician: [place], [name] Treating Physician/Extender: [name] in Treatment: 2 Visit Information History Since Last Visit Any new allergies or adverse reactions: No Patient Arrived: Wheel Chair Had a fall or experienced change in No activities of daily living that may affect Arrival Time: 15:25 risk of falls: Accompanied By: friend Signs or symptoms of abuse/neglect since last No Transfer Assistance: Manual visito Patient Identification Verified: Yes Hospitalized since last visit: No Secondary Verification Process Yes Has Dressing in Place as Prescribed: Yes Completed: Pain Present Now: No Patient Has Alerts: No Electronic Signature(s) Signed: 02/11/2015 4:29:43 PM By: [name] Entered By: [name] Eric on 02/11/2015 15:26:02 [name] -------------------------------------------------------------------------------- Encounter Discharge Information Details Patient Name: [name], [name] Date of Service: 02/11/2015 3:15 PM Medical Record Number: [id number] Patient Account Number: [id number] Date of Birth/Sex: [birth year] (49 y.o. Female) Treating [name]: [name] Primary Care Physician: [name] Other Clinician: Referring Physician: [place], [name] Treating Physician/Extender: [name] in Treatment: 2 Encounter Discharge Information Items Schedule Follow-up Appointment: No Medication Reconciliation completed No and provided to Patient/Care [name]: Provided on Clinical Summary of Care: 02/11/2015 Form Type Recipient Paper Patient CW Electronic  Signature(s) Signed: 02/11/2015 3:58:18 PM By: [name] Entered By: [name] on 02/11/2015 15:58:18 [name] ([id number]) -------------------------------------------------------------------------------- Lower Extremity Assessment Details Patient Name: [name], [name] Date of Service: 02/11/2015 3:15 PM Medical Record Number: [id number] Patient Account Number: [id number] Date of Birth/Sex: Dec 04, 1965 (49 y.o. Female) Treating [name]: [name], [place] [name] Primary Care Physician: [name] Other Clinician: Referring Physician: [place], [name] Treating Physician/Extender: [name] in Treatment: 2 Electronic Signature(s) Signed: 02/11/2015 4:29:43 PM By: [name] Entered By: [name] Eric on 02/11/2015 15:28:52 [name] ([id number]) -------------------------------------------------------------------------------- Multi Wound Chart Details Patient Name: [name], [name] Date of Service: 02/11/2015 3:15 PM Medical Record Number: [id number] Patient Account Number: [id number] Date of Birth/Sex: 1966/03/08 (49 y.o. Female) Treating [name]: [name] Mealy, [name], [name], [name] Primary Care Physician: [name] Other Clinician: Referring Physician: [place], [name] Treating Physician/Extender: [name] in Treatment: 2 Vital Signs Height(in): 63 Pulse(bpm): 78 Weight(lbs): 100 Blood Pressure 116/74 (mmHg): Body Mass Index(BMI): 18 Temperature(F): 97.8 Respiratory Rate 16 (breaths/min): Photos: [1:No Photos] [2:No Photos] [N/A:N/A] Wound Location: [1:Right Scapula] [2:Sacrum - Midline] [N/A:N/A] Wounding Event: [1:Pressure Injury] [2:Pressure Injury] [N/A:N/A] Primary Etiology: [1:Pressure Ulcer] [2:Pressure Ulcer] [N/A:N/A] Comorbid History: [1:Asthma, Chronic Obstructive Pulmonary Disease (COPD), Paraplegia] [2:Asthma, Chronic Obstructive Pulmonary Disease (COPD), Paraplegia] [N/A:N/A] Date Acquired: [1:09/21/2014] [2:12/02/2007] [N/A:N/A] Weeks  of Treatment: [1:2] [2:2] [N/A:N/A] Wound Status: [1:Open] [2:Open] [N/A:N/A] Measurements L x W x D 1.5x1x0.7 [2:4x5x0.1] [N/A:N/A] (cm) Area (cm) : [1:1.178] [2:15.708] [N/A:N/A] Volume (cm) : [1:0.825] [2:1.571] [N/A:N/A] % Reduction in Area: [1:3.80%] [2:-1983.30%] [N/A:N/A] % Reduction in Volume: 15.80% [2:-1994.70%] [N/A:N/A] Starting Position 1 10 (o'clock): Ending Position 1 [1:6] (o'clock): Maximum Distance 1 4 (cm): Undermining: [1:Yes] [2:No] [N/A:N/A] 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] [N/A:N/A] Exudate Amount: [1:Large] [2:Medium] [N/A:N/A] Exudate Type: [1:Purulent] [2:Serous] [N/A:N/A] Exudate Color: [1:yellow, brown, green] [2:amber] [N/A:N/A] Foul Odor After [1:No] [2:Yes] [N/A:N/A] Cleansing: [name], [name] ([id number]) Odor Anticipated Due to N/A No N/A Product Use: Wound Margin: Flat and Intact Flat and Intact N/A Granulation Amount:  Large (67-100%) Small (1-33%) N/A Granulation Quality: Red, Pink Pink N/A Necrotic Amount: None Present (0%) Large (67-100%) N/A Exposed Structures: Bone: Yes Bone: Yes N/A Fascia: No Fascia: No Fat: No Fat: No Tendon: No Tendon: No Muscle: No Muscle: No Joint: No Joint: No Epithelialization: None None N/A Periwound Skin Texture: Edema: No Scarring: Yes N/A Excoriation: No Edema: No Induration: No Excoriation: No Callus: No Induration: No Crepitus: No Callus: No Fluctuance: No Crepitus: No Friable: No Fluctuance: No Rash: No Friable: No Scarring: No Rash: No Periwound Skin Moist: Yes Moist: Yes N/A Moisture: Maceration: No Maceration: No Dry/Scaly: No Dry/Scaly: No Periwound Skin Color: Atrophie Blanche: No Atrophie Blanche: No N/A Cyanosis: No Cyanosis: No Ecchymosis: No Ecchymosis: No Erythema: No Erythema: No Hemosiderin Staining: No Hemosiderin Staining: No Mottled: No Mottled: No Pallor: No Pallor: No Rubor: No Rubor: No Temperature: No Abnormality No  Abnormality N/A Tenderness on Yes Yes N/A Palpation: Wound Preparation: Ulcer Cleansing: Ulcer Cleansing: N/A Rinsed/Irrigated with Rinsed/Irrigated with Saline Saline Topical Anesthetic Topical Anesthetic Applied: Other: lidocaine Applied: Other: lidocaine 4% 4% Treatment Notes Electronic Signature(s) Signed: 02/11/2015 4:29:43 PM By: [name] Entered By: [name] Eric on 02/11/2015 15:40:06 Fedor, [name] ([id number]) [name], [name] ([id number]) -------------------------------------------------------------------------------- Multi-Disciplinary Care Plan Details Patient Name: [name], [name] Date of Service: 02/11/2015 3:15 PM Medical Record Number: [id number] Patient Account Number: [id number] Date of Birth/Sex: 1966/05/31 (49 y.o. Female) Treating [name]: [name], Rita Primary Care Physician: [name] Other Clinician: Referring Physician: [place], [name] Treating Physician/Extender: [name] in Treatment: 2 Active Inactive Abuse / Safety / Falls / Self Care Management Nursing Diagnoses: Impaired home maintenance Impaired physical mobility Potential for falls Self care deficit: actual or potential Goals: Patient/caregiver will verbalize understanding of skin care regimen Date Initiated: 01/27/2015 Goal Status: Active Patient/caregiver will verbalize/demonstrate measure taken to improve self care Date Initiated: 01/27/2015 Goal Status: Active Patient/caregiver will verbalize/demonstrate measures taken to improve the patient's personal safety Date Initiated: 01/27/2015 Goal Status: Active Patient/caregiver will verbalize/demonstrate measures taken to prevent injury and/or falls Date Initiated: 01/27/2015 Goal Status: Active Patient/caregiver will verbalize/demonstrate understanding of what to do in case of emergency Date Initiated: 01/27/2015 Goal Status: Active Interventions: Assess fall risk on admission and as needed Assess:  immobility, friction, shearing, incontinence upon admission and as needed Assess impairment of mobility on admission and as needed per policy Assess self care needs on admission and as needed Provide education on basic hygiene Provide education on fall prevention Provide education on personal and home safety Provide education on safe transfers [name], [name] ([id number]) Treatment Activities: Education provided on Basic Hygiene : 02/04/2015 Notes: Orientation to the Wound Care Program Nursing Diagnoses: Knowledge deficit related to the wound healing center program Goals: Patient/caregiver will verbalize understanding of the Wound Healing Center Program Date Initiated: 01/27/2015 Goal Status: Active Interventions: Provide education on orientation to the wound center Notes: Pressure Nursing Diagnoses: Knowledge deficit related to causes and risk factors for pressure ulcer development Knowledge deficit related to management of pressures ulcers Potential for impaired tissue integrity related to pressure, friction, moisture, and shear Goals: Patient will remain free from development of additional pressure ulcers Date Initiated: 01/27/2015 Goal Status: Active Patient will remain free of pressure ulcers Date Initiated: 01/27/2015 Goal Status: Active Patient/caregiver will verbalize risk factors for pressure ulcer development Date Initiated: 01/27/2015 Goal Status: Active Patient/caregiver will verbalize understanding of pressure ulcer management Date Initiated: 01/27/2015 Goal Status: Active Interventions: Assess: immobility, friction, shearing, incontinence upon admission and  as needed Assess offloading mechanisms upon admission and as needed Assess potential for pressure ulcer upon admission and as needed Provide education on pressure ulcers P[place]i, Jayonna M. ([id number]) Treatment Activities: Patient referred for pressure reduction/relief devices : 02/11/2015 Patient referred  for seating evaluation to ensure proper offloading : 02/11/2015 Notes: Wound/Skin Impairment Nursing Diagnoses: Impaired tissue integrity Knowledge deficit related to smoking impact on wound healing Knowledge deficit related to ulceration/compromised skin integrity Goals: Patient/caregiver will verbalize understanding of skin care regimen Date Initiated: 01/27/2015 Goal Status: Active Ulcer/skin breakdown will have a volume reduction of 30% by week 4 Date Initiated: 01/27/2015 Goal Status: Active Ulcer/skin breakdown will have a volume reduction of 50% by week 8 Date Initiated: 01/27/2015 Goal Status: Active Ulcer/skin breakdown will have a volume reduction of 80% by week 12 Date Initiated: 01/27/2015 Goal Status: Active Ulcer/skin breakdown will heal within 14 weeks Date Initiated: 01/27/2015 Goal Status: Active Interventions: Assess ulceration(s) every visit Provide education on ulcer and skin care Notes: Electronic Signature(s) Signed: 02/11/2015 4:29:43 PM By: [name] Entered By: [name] Eric on 02/11/2015 15:39:59 Kirby, [name] ([id number]) -------------------------------------------------------------------------------- Pain Assessment Details Patient Name: [name] Date of Service: 02/11/2015 3:15 PM Medical Record Number: [id number] Patient Account Number: [id number] Date of Birth/Sex: 1965-09-22 (49 y.o. Female) Treating [name]: [name] Mealy, [name], [name], [place] [name] Primary Care Physician: [name] Other Clinician: Referring Physician: [place], [name] Treating Physician/Extender: [name] in Treatment: 2 Active Problems Location of Pain Severity and Description of Pain Patient Has Paino No Site Locations Pain Management and Medication Current Pain Management: Electronic Signature(s) Signed: 02/11/2015 4:29:43 PM By: [name] Entered By: [name] Eric on 02/11/2015 15:27:55 Baumgarner, [name]  ([id number]) -------------------------------------------------------------------------------- Patient/Caregiver Education Details Patient Name: [name], [name] Date of Service: 02/11/2015 3:15 PM Medical Record Number: [id number] Patient Account Number: [id number] Date of Birth/Gender: 1965/07/07 (49 y.o. Female) Treating [name]: [name], [place] [name] Primary Care Physician: [name] Other Clinician: Referring Physician: [place], [name] Treating Physician/Extender: [name] in Treatment: 2 Education Assessment Education Provided To: Patient Education Topics Provided Basic Hygiene: Methods: Explain/Verbal Responses: State content correctly Pressure: Methods: Explain/Verbal Responses: State content correctly Safety: Methods: Explain/Verbal Responses: State content correctly Welcome To The Wound Care Center: Methods: Explain/Verbal Responses: State content correctly Wound/Skin Impairment: Methods: Explain/Verbal Responses: State content correctly Electronic Signature(s) Signed: 02/11/2015 4:29:43 PM By: [name] Entered By: [name] Eric on 02/11/2015 15:58:53 Saline, [name] ([id number]) -------------------------------------------------------------------------------- Wound Assessment Details Patient Name: [name] Date of Service: 02/11/2015 3:15 PM Medical Record Number: [id number] Patient Account Number: [id number] Date of Birth/Sex: 1965/09/07 (49 y.o. Female) Treating [name]: [name], Psychologist, clinical Primary Care Physician: [name] Other Clinician: Referring Physician: [place], [name] Treating Physician/Extender: [name] in Treatment: 2 Wound Status Wound Number: 1 Primary Pressure Ulcer Etiology: Wound Location: Right Scapula Wound Open Wounding Event: Pressure Injury Status: Date Acquired: 09/21/2014 Comorbid Asthma, Chronic Obstructive Weeks Of Treatment: 2 History: Pulmonary Disease (COPD), Clustered Wound:  No Paraplegia Photos Photo Uploaded By: [name] Eric on 02/11/2015 17:20:01 Wound Measurements Length: (cm) 1.5 Width: (cm) 1 Depth: (cm) 0.7 Area: (cm) 1.178 Volume: (cm) 0.825 % Reduction in Area: 3.8% % Reduction in Volume: 15.8% Epithelialization: None Tunneling: No Undermining: Yes Starting Position (o'clock): 10 Ending Position (o'clock): 6 Maximum Distance: (cm) 4 Wound Description Classification: Category/Stage IV Wound Margin: Flat and Intact Exudate Amount: Large Exudate Type: Purulent Exudate Color: yellow, brown, green Foul Odor After Cleansing: No Wound Bed [name], [name]  M. ([id number]) Granulation Amount: Large (67-100%) Exposed Structure Granulation Quality: Red, Pink Fascia Exposed: No Necrotic Amount: None Present (0%) Fat Layer Exposed: No Tendon Exposed: No Muscle Exposed: No Joint Exposed: No Bone Exposed: Yes Periwound Skin Texture Texture Color No Abnormalities Noted: No No Abnormalities Noted: No Callus: No Atrophie Blanche: No Crepitus: No Cyanosis: No Excoriation: No Ecchymosis: No Fluctuance: No Erythema: No Friable: No Hemosiderin Staining: No Induration: No Mottled: No Localized Edema: No Pallor: No Rash: No Rubor: No Scarring: No Temperature / Pain Moisture Temperature: No Abnormality No Abnormalities Noted: No Tenderness on Palpation: Yes Dry / Scaly: No Maceration: No Moist: Yes Wound Preparation Ulcer Cleansing: Rinsed/Irrigated with Saline Topical Anesthetic Applied: Other: lidocaine 4%, Treatment Notes Wound #1 (Right Scapula) 1. Cleansed with: Clean wound with Normal Saline 2. Anesthetic Topical Lidocaine 4% cream to wound bed prior to debridement 4. Dressing Applied: Aquacel Ag 5. Secondary Dressing Applied Bordered Foam Dressing Electronic Signature(s) Signed: 02/11/2015 4:29:43 PM By: [name] Entered By: [name] Eric on 02/11/2015 15:32:54 Arno, [name]  ([id number]) -------------------------------------------------------------------------------- Wound Assessment Details Patient Name: [name], [name] Date of Service: 02/11/2015 3:15 PM Medical Record Number: [id number] Patient Account Number: [id number] Date of Birth/Sex: 1966/06/06 (49 y.o. Female) Treating [name]: [name], Psychologist, clinical Primary Care Physician: [name] Other Clinician: Referring Physician: [place], [name] Treating Physician/Extender: [name] in Treatment: 2 Wound Status Wound Number: 2 Primary Pressure Ulcer Etiology: Wound Location: Sacrum - Midline Wound Open Wounding Event: Pressure Injury Status: Date Acquired: 12/02/2007 Comorbid Asthma, Chronic Obstructive Weeks Of Treatment: 2 History: Pulmonary Disease (COPD), Clustered Wound: No Paraplegia Photos Photo Uploaded By: [name] Eric on 02/11/2015 17:20:02 Wound Measurements Length: (cm) 4 Width: (cm) 5 Depth: (cm) 0.1 Area: (cm) 15.708 Volume: (cm) 1.571 % Reduction in Area: -1983.3% % Reduction in Volume: -1994.7% Epithelialization: None Tunneling: No Undermining: No Wound Description Classification: Category/Stage IV Wound Margin: Flat and Intact Exudate Amount: Medium Exudate Type: Serous Exudate Color: amber Foul Odor After Cleansing: Yes Due to Product Use: No Wound Bed Granulation Amount: Small (1-33%) Exposed Structure Granulation Quality: Pink Fascia Exposed: No Necrotic Amount: Large (67-100%) Fat Layer Exposed: No Necrotic Quality: Adherent Slough Tendon Exposed: No [name], [name] ([id number]) Muscle Exposed: No Joint Exposed: No Bone Exposed: Yes Periwound Skin Texture Texture Color No Abnormalities Noted: No No Abnormalities Noted: No Callus: No Atrophie Blanche: No Crepitus: No Cyanosis: No Excoriation: No Ecchymosis: No Fluctuance: No Erythema: No Friable: No Hemosiderin Staining: No Induration: No Mottled: No Localized Edema: No Pallor:  No Rash: No Rubor: No Scarring: Yes Temperature / Pain Moisture Temperature: No Abnormality No Abnormalities Noted: No Tenderness on Palpation: Yes Dry / Scaly: No Maceration: No Moist: Yes Wound Preparation Ulcer Cleansing: Rinsed/Irrigated with Saline Topical Anesthetic Applied: Other: lidocaine 4%, Treatment Notes Wound #2 (Midline Sacrum) 1. Cleansed with: Clean wound with Normal Saline 2. Anesthetic Topical Lidocaine 4% cream to wound bed prior to debridement 4. Dressing Applied: Aquacel Ag 5. Secondary Dressing Applied Bordered Foam Dressing Electronic Signature(s) Signed: 02/11/2015 4:29:43 PM By: [name] Entered By: [name] Eric on 02/11/2015 15:38:12 Bracey, [name] ([id number]) -------------------------------------------------------------------------------- Vitals Details Patient Name: KASSADEE, CARAWAN. Date of Service: 02/11/2015 3:15 PM Medical Record Number: [id number] Patient Account Number: [id number] Date of Birth/Sex: [birth year] (49 y.o. Female) Treating [name]: [name] Mealy, [name], [name], [place] [name] Primary Care Physician: [name] Other Clinician: Referring Physician: [place], [name] Treating Physician/Extender: [name] in Treatment: 2 Vital Signs Time Taken: 15:24 Temperature (F): 97.8  Height (in): 63 Pulse (bpm): 78 Weight (lbs): 100 Respiratory Rate (breaths/min): 16 Body Mass Index (BMI): 17.7 Blood Pressure (mmHg): 116/74 Reference Range: 80 - 120 mg / dl Electronic Signature(s) Signed: 02/11/2015 4:29:43 PM By: [name] Entered By: [name] Eric on 02/11/2015 16:10:96

## 2015-02-18 ENCOUNTER — Encounter: Payer: Medicare Other | Admitting: Surgery

## 2015-02-18 DIAGNOSIS — L89114 Pressure ulcer of right upper back, stage 4: Secondary | ICD-10-CM | POA: Diagnosis not present

## 2015-02-18 NOTE — Progress Notes (Addendum)
[name], [name] ([id number]) Visit Report for 02/18/2015 Chief Complaint Document Details Patient Name: [name] 02/18/2015 10:45 Date of Service: AM Medical Record [id number] Number: Patient Account Number: [id number] 04/20/1966 (49 y.o. Treating RN: [name] Date of Birth/Sex: Female) Other Clinician: Primary Care Physician: [name] Treating [name] Referring Physician: [place], [name] Physician/Extender: [name] in Treatment: 3 Information Obtained from: Patient Chief Complaint Patient presents to the wound care center for a consult due non healing wound. 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 She comes in for a pressure injury to the right scapular region and the sacral region. She has this for several months. Electronic Signature(s) Signed: 02/18/2015 11:18:30 AM By: [name] Kanner MD, FACS Entered By: [name] on 02/18/2015 11:18:30 [name] -------------------------------------------------------------------------------- Debridement Details Patient Name: [name], [name] 02/18/2015 10:45 Date of Service: AM Medical Record [id number] Number: Patient Account Number: [id number] September 20, 1965 (49 y.o. Treating RN: [name] Date of Birth/Sex: Female) Other Clinician: Primary Care Physician: [name] [place], Raynelle Fanning Treating [name] Referring Physician: [place], [name] Physician/Extender: [name] in Treatment: 3 Debridement Performed for Wound #1 Right Scapula Assessment: Performed By: Physician [name], MD Debridement: Debridement Pre-procedure Yes Verification/Time Out Taken: Start Time: 11:10 Pain Control: Other : liodocaine 4% Level: Skin/Subcutaneous Tissue Total Area Debrided ([name] x 1.2 (cm) x 1 (cm) = 1.2 (cm) W): Tissue and other Non-Viable, Exudate, Fibrin/Slough, Subcutaneous material debrided: Instrument: Forceps Bleeding: Minimum Hemostasis  Achieved: Pressure End Time: 11:17 Procedural Pain: 0 Post Procedural Pain: 0 Response to Treatment: Procedure was tolerated well Post Debridement Measurements of Total Wound Length: (cm) 1.2 Stage: Category/Stage IV Width: (cm) 1 Depth: (cm) 0.1 Volume: (cm) 0.094 Electronic Signature(s) Signed: 02/18/2015 11:18:24 AM By: [name] Kanner MD, FACS Signed: 02/18/2015 12:48:10 PM By: [name] Entered By: [name] on 02/18/2015 11:18:23 [name], [name] ([id number]) -------------------------------------------------------------------------------- HPI Details Patient Name: [name], [name] 02/18/2015 10:45 Date of Service: AM Medical Record [id number] Number: Patient Account Number: [id number] 1965/09/21 (49 y.o. Treating RN: [name] Date of Birth/Sex: Female) Other Clinician: Primary Care Physician: [name] Treating [name] Referring Physician: [place], [name] Physician/Extender: [name] in Treatment: 3 History of Present Illness HPI Description: 49 year old patient was had quadriparesis for the last 7 years and has had previous extensive surgery and reconstruction at [facility]. For about 4-5 months she's had a decubitus ulcer on the right scapular region and she has a couple of small areas which have opened out in the region of previous scar tissue in the sacral region. He does not have any other significant comorbidities. She has a good Roho cushion for her wheelchair and she has a specialized mattress for sleep. She has stopped smoking a year ago. 02/11/2015 -- she has not yet got her appointment with the plastic surgeons at [facility]. They did a x-ray of the sacral region this afternoon just before coming here. 02/18/2015 -- X-ray of the pelvis -- IMPRESSION:No acute osseous injury of the pelvis. If there is clinical concern regarding osteomyelitis, recommend further evaluation with CT. X-ray of the sacrum and coccyx  IMPRESSION:No acute osseous abnormality of the sacrum. If there is further clinical concern regarding osteomyelitis of the sacrum, further evaluation with CT is recommended. Electronic Signature(s) Signed: 02/18/2015 11:19:01 AM By: [name] Kanner MD, FACS Entered By: [name] on 02/18/2015 11:19:00 [name] ([id number]) -------------------------------------------------------------------------------- Physical Exam Details Patient Name: [name], PRZYBYLSKI. 02/18/2015 10:45  Date of Service: AM Medical Record [id number] Number: Patient Account Number: [id number] Nov 28, 1965 (49 y.o. Treating RN: [name] Date of Birth/Sex: Female) Other Clinician: Primary Care Physician: [name] Treating [name] Referring Physician: [place], [name] Physician/Extender: [name] in Treatment: 3 Constitutional . Pulse regular. Respirations normal and unlabored. Afebrile. . Eyes Nonicteric. Reactive to light. Ears, Nose, Mouth, and Throat Lips, teeth, and gums WNL.Marland Kitchen Moist mucosa without lesions . Neck supple and nontender. No palpable supraclavicular or cervical adenopathy. Normal sized without goiter. Respiratory WNL. No retractions.. Breath sounds WNL, No rubs, rales, rhonchi, or wheeze.. Cardiovascular Heart rhythm and rate regular, no murmur or gallop.. Pedal Pulses WNL. No clubbing, cyanosis or edema. Chest Breasts symmetical and no nipple discharge.. Breast tissue WNL, no masses, lumps, or tenderness.. Lymphatic No adneopathy. No adenopathy. No adenopathy. Musculoskeletal Adexa without tenderness or enlargement.. Digits and nails w/o clubbing, cyanosis, infection, petechiae, ischemia, or inflammatory conditions.. Integumentary (Hair, Skin) No suspicious lesions. No crepitus or fluctuance. No peri-wound warmth or erythema. No masses.Marland Kitchen Psychiatric Judgement and insight Intact.. No evidence of depression, anxiety, or agitation.. Notes The right scapular region has had sharp  dissection done with a forceps and saline gauze to remove all the adherent slough. The sacral region continues to have minimum slough but there is probing down to bone. Electronic Signature(s) Signed: 02/18/2015 11:19:44 AM By: [name] Kanner MD, FACS Entered By: [name] on 02/18/2015 11:19:44 [name] ([id number]) -------------------------------------------------------------------------------- Physician Orders Details Patient Name: [name], [name] 02/18/2015 10:45 Date of Service: AM Medical Record [id number] Number: Patient Account Number: [id number] 03-21-1966 (49 y.o. 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 Wound #2 Midline Sacrum o Cleanse wound with mild soap and water o May Shower, gently pat wound dry prior to applying new dressing. Skin Barriers/Peri-Wound Care Wound #1 Right Scapula o Barrier cream Wound #2 Midline Sacrum o Barrier cream Primary Wound Dressing Wound #1 Right Scapula o Aquacel Ag - rope Wound #2 Midline Sacrum o Aquacel Ag - rope Secondary Dressing Wound #1 Right Scapula o Boardered Foam Dressing [name], [name] ([id number]) Wound #2 Midline Sacrum o Boardered Foam Dressing Dressing Change Frequency Wound #1 Right Scapula o Change dressing every other day. Wound #2 Midline Sacrum o  Change dressing every other day. Follow-up Appointments Wound #1 Right Scapula o Return Appointment in 1 week. Off-Loading Wound #1 Right Scapula o Turn and reposition every 2 hours Wound #2 Midline Sacrum o Turn and reposition every 2 hours Home Health Wound #1 Right Scapula o Continue Home Health Visits - [facility] o Home Health Nurse may visit PRN to address patientos wound care needs. o FACE TO FACE ENCOUNTER: MEDICARE and MEDICAID PATIENTS: I certify that this patient is under my care and that I had a face-to-face encounter that meets the physician face-to-face encounter requirements with this patient on this date. The encounter with the patient was in whole or in part for the following MEDICAL CONDITION: (primary reason for Home Healthcare) MEDICAL  NECESSITY: I certify, that based on my findings, NURSING services are a medically necessary home health service. HOME BOUND STATUS: I certify that my clinical findings support that this patient is homebound (i.e., Due to illness or injury, pt requires aid of supportive devices such as crutches, cane, wheelchairs, walkers, the use of special transportation or the assistance of another person to leave their place of residence. There is a normal inability to leave the home and doing so requires considerable and taxing effort. Other absences are for medical reasons / religious services and are infrequent or of short duration when for other reasons). o If current dressing causes regression in wound condition, may D/C ordered dressing product/s and apply Normal Saline Moist Dressing daily until next Wound Healing Center / Other MD appointment. Notify Wound Healing Center of regression in wound condition at [phone]. o Please direct any NON-WOUND related issues/requests for orders to patient's Primary Care Physician Wound #2 Midline Sacrum o Continue Home Health Visits - [facility] o Home Health  Nurse may visit PRN to address patientos wound care needs. [name], [name] ([id number]) o FACE TO FACE ENCOUNTER: MEDICARE and MEDICAID PATIENTS: I certify that this patient is under my care and that I had a face-to-face encounter that meets the physician face-to-face encounter requirements with this patient on this date. The encounter with the patient was in whole or in part for the following MEDICAL CONDITION: (primary reason for Home Healthcare) MEDICAL NECESSITY: I certify, that based on my findings, NURSING services are a medically necessary home health service. HOME BOUND STATUS: I certify that my clinical findings support that this patient is homebound (i.e., Due to illness or injury, pt requires aid of supportive devices such as crutches, cane, wheelchairs, walkers, the use of special transportation or the assistance of another person to leave their place of residence. There is a normal inability to leave the home and doing so requires considerable and taxing effort. Other absences are for medical reasons / religious services and are infrequent or of short duration when for other reasons). o If current dressing causes regression in wound condition, may D/C ordered dressing product/s and apply Normal Saline Moist Dressing daily until next Wound Healing Center / Other MD appointment. Notify Wound Healing Center of regression in wound condition at [phone]. o Please direct any NON-WOUND related issues/requests for orders to patient's Primary Care Physician Notes Daily multivitamin Electronic Signature(s) Signed: 02/18/2015 12:48:10 PM By: [name] Gurney RN, [name], Kim RN, [name] Signed: 02/18/2015 1:56:54 PM By: [name] Kanner MD, FACS Entered By: [name] Gurney RN, [name], Kim on 02/18/2015 11:25:55 Mehl, [name] ([id number]) -------------------------------------------------------------------------------- Problem List Details Patient Name: GWENDLYN, HANBACK 02/18/2015 10:45 Date of  Service: AM Medical Record [id number] Number: Patient Account Number: [id number] 1965/09/11 (49 y.o. 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 Treating RN: [name] Date  of Birth/Sex: Female) Other Clinician: Primary Care Physician: [name] Treating [name] Referring Physician: [place], [name] Physician/Extender: [name] in Treatment: 3 Subjective Chief Complaint Information obtained from Patient Patient presents to the wound care center for a consult due non healing wound. 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 She comes in for a pressure injury to the right scapular region and the sacral region. She has this for several months. 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 For about 4-5 months she's had a decubitus ulcer on the right scapular  region and she has a couple of small areas which have opened out in the region of previous scar tissue in the sacral region. He does not have any other significant comorbidities. She has a good Roho cushion for her wheelchair and she has a specialized mattress for sleep. She has stopped smoking a year ago. 02/11/2015 -- she has not yet got her appointment with the plastic surgeons at [facility]. They did a x-ray of the sacral region this afternoon just before coming here. 02/18/2015 -- X-ray of the pelvis -- IMPRESSION:No acute osseous injury of the pelvis. If there is clinical concern regarding osteomyelitis, recommend further evaluation with CT. X-ray of the sacrum and coccyx IMPRESSION:No acute osseous abnormality of the sacrum. If there is further clinical concern regarding osteomyelitis of the sacrum, further evaluation with CT is recommended. Objective Constitutional Pulse regular. Respirations normal and unlabored. Afebrile. Vitals Time Taken: 10:55 AM, Height: 63 in, Weight: 100 lbs, BMI: 17.7, Temperature: 98.1 F, Pulse: 75 [name], [name] ([id number]) bpm, Respiratory Rate: 18 breaths/min, Blood Pressure: 118/73 mmHg. Eyes Nonicteric. Reactive to light. Ears, Nose, Mouth, and Throat Lips, teeth, and gums WNL.Marland Kitchen Moist mucosa without lesions . Neck supple and nontender. No palpable supraclavicular or cervical adenopathy. Normal sized without goiter. Respiratory WNL. No retractions.. Breath sounds WNL, No rubs, rales, rhonchi, or wheeze.. Cardiovascular Heart rhythm and rate regular, no murmur or gallop.. Pedal Pulses WNL. No clubbing, cyanosis or edema. Chest Breasts symmetical and no nipple discharge.. Breast tissue WNL, no masses, lumps, or tenderness.. Lymphatic No adneopathy. No adenopathy. No adenopathy. Musculoskeletal Adexa without tenderness or enlargement.. Digits and nails w/o clubbing, cyanosis, infection, petechiae, ischemia, or inflammatory  conditions.Marland Kitchen Psychiatric Judgement and insight Intact.. No evidence of depression, anxiety, or agitation.. General Notes: The right scapular region has had sharp dissection done with a forceps and saline gauze to remove all the adherent slough. The sacral region continues to have minimum slough but there is probing down to bone. Integumentary (Hair, Skin) No suspicious lesions. No crepitus or fluctuance. No peri-wound warmth or erythema. No masses.. Wound #1 status is Open. Original cause of wound was Pressure Injury. The wound is located on the Right Scapula. The wound measures 1.2cm length x 1cm width x 0.5cm depth; 0.942cm^2 area and 0.471cm^3 volume. There is bone exposed. There is undermining starting at 10:00 and ending at 6:00 with a maximum distance of 2.5cm. There is a large amount of purulent drainage noted. The wound margin is flat and intact. There is large (67-100%) red, pink granulation within the wound bed. There is no necrotic tissue within the wound bed. The periwound skin appearance exhibited: Moist. The periwound skin appearance did not exhibit: Callus, Crepitus, Excoriation, Fluctuance, Friable, Induration, Localized Edema, Rash, Scarring, Dry/Scaly, Maceration, Atrophie Blanche, Cyanosis, Ecchymosis,  Hemosiderin Staining, Mottled, Pallor, Rubor, Erythema. Periwound temperature was noted as No Abnormality. The periwound has tenderness on palpation. Wound #2 status is Open. Original cause of wound was Pressure Injury. The wound is located on the [place]. ([id number]) Midline Sacrum. The wound measures 2cm length x 0.5cm width x 0.2cm depth; 0.785cm^2 area and 0.157cm^3 volume. There is bone exposed. There is a medium amount of serous drainage noted. The wound margin is flat and intact. There is small (1-33%) pink granulation within the wound bed. There is a large (67-100%) amount of necrotic tissue within the wound bed including Adherent Slough. The periwound skin  appearance exhibited: Scarring, Moist. The periwound skin appearance did not exhibit: Callus, Crepitus, Excoriation, Fluctuance, Friable, Induration, Localized Edema, Rash, Dry/Scaly, Maceration, Atrophie Blanche, Cyanosis, Ecchymosis, Hemosiderin Staining, Mottled, Pallor, Rubor, Erythema. Periwound temperature was noted as No Abnormality. The periwound has tenderness on palpation. Assessment Active Problems ICD-10 L89.114 - Pressure ulcer of right upper back, stage 4 L89.154 - Pressure ulcer of sacral region, stage 4 G82.52 - Quadriplegia, C1-C4 incomplete E44.0 - Moderate protein-calorie malnutrition We will use silver alginate packed into the right scapular area and continue with offloading as much as possible. The sacral region will have dressing as before. We have again discussed offloading in great detail, and nutrition and multivitamins and she is being compliant. We are awaiting a plastic surgery opinion from [facility] [name] C. Procedures Wound #1 Wound #1 is a Pressure Ulcer located on the Right Scapula . There was a Skin/Subcutaneous Tissue Debridement ([id number]) debridement with total area of 1.2 sq cm performed by Kerrianne Jeng, Ignacia Felling., MD. with the following instrument(s): Forceps to remove Non-Viable tissue/material including Exudate, Fibrin/Slough, and Subcutaneous after achieving pain control using Other (liodocaine 4%). A time out was conducted prior to the start of the procedure. A Minimum amount of bleeding was controlled with Pressure. The procedure was tolerated well with a pain level of 0 throughout and a pain level of 0 following the procedure. Post Debridement Measurements: 1.2cm length x 1cm width x 0.1cm depth; 0.094cm^3 volume. Post debridement Stage noted as Category/Stage IV. [name], [name] ([id number]) Plan Wound Cleansing: Wound #1 Right Scapula: Cleanse wound with mild soap and water May Shower, gently pat wound dry prior to applying new dressing. Wound #2  Midline Sacrum: Cleanse wound with mild soap and water May Shower, gently pat wound dry prior to applying new dressing. Skin Barriers/Peri-Wound Care: Wound #1 Right Scapula: Barrier cream Wound #2 Midline Sacrum: Barrier cream Primary Wound Dressing: Wound #1 Right Scapula: Aquacel Ag - rope Wound #2 Midline Sacrum: Aquacel Ag - rope Secondary Dressing: Wound #1 Right Scapula: Boardered Foam Dressing Wound #2 Midline Sacrum: Boardered Foam Dressing Dressing Change Frequency: Wound #1 Right Scapula: Change dressing every other day. Wound #2 Midline Sacrum: Change dressing every other day. Follow-up Appointments: Wound #1 Right Scapula: Return Appointment in 1 week. Off-Loading: Wound #1 Right Scapula: Turn and reposition every 2 hours Wound #2 Midline Sacrum: Turn and reposition every 2 hours Home Health: Wound #1 Right Scapula: Continue Home Health Visits - [facility] Home Health Nurse may visit PRN to address patient s wound care needs. FACE TO FACE ENCOUNTER: MEDICARE and MEDICAID PATIENTS: I certify that this patient is under my care and that I had a face-to-face encounter that meets the physician face-to-face encounter requirements with this patient on this date. The encounter with the patient was in whole or in part for the following MEDICAL CONDITION: (primary reason for Home  Healthcare) MEDICAL NECESSITY: I certify, that based on my findings, NURSING services are a medically necessary home health service. HOME BOUND STATUS: I certify that my clinical findings support that this patient is homebound (i.e., Due to illness or injury, pt requires aid of supportive devices such as crutches, cane, wheelchairs, walkers, the use of special transportation or the assistance of another person to leave their place of residence. There is a [name], LORAH. ([id number]) normal inability to leave the home and doing so requires considerable and taxing effort. Other  absences are for medical reasons / religious services and are infrequent or of short duration when for other reasons). If current dressing causes regression in wound condition, may D/C ordered dressing product/s and apply Normal Saline Moist Dressing daily until next Wound Healing Center / Other MD appointment. Notify Wound Healing Center of regression in wound condition at [phone]. Please direct any NON-WOUND related issues/requests for orders to patient's Primary Care Physician Wound #2 Midline Sacrum: Continue Home Health Visits - [facility] Home Health Nurse may visit PRN to address patient s wound care needs. FACE TO FACE ENCOUNTER: MEDICARE and MEDICAID PATIENTS: I certify that this patient is under my care and that I had a face-to-face encounter that meets the physician face-to-face encounter requirements with this patient on this date. The encounter with the patient was in whole or in part for the following MEDICAL CONDITION: (primary reason for Home Healthcare) MEDICAL NECESSITY: I certify, that based on my findings, NURSING services are a medically necessary home health service. HOME BOUND STATUS: I certify that my clinical findings support that this patient is homebound (i.e., Due to illness or injury, pt requires aid of supportive devices such as crutches, cane, wheelchairs, walkers, the use of special transportation or the assistance of another person to leave their place of residence. There is a normal inability to leave the home and doing so requires considerable and taxing effort. Other absences are for medical reasons / religious services and are infrequent or of short duration when for other reasons). If current dressing causes regression in wound condition, may D/C ordered dressing product/s and apply Normal Saline Moist Dressing daily until next Wound Healing Center / Other MD appointment. Notify Wound Healing Center of regression in wound condition at  [phone]. Please direct any NON-WOUND related issues/requests for orders to patient's Primary Care Physician General Notes: Daily multivitamin We will use silver alginate packed into the right scapular area and continue with offloading as much as possible. The sacral region will have dressing as before. We have again discussed offloading in great detail, and nutrition and multivitamins and she is being compliant. We are awaiting a plastic surgery opinion from [facility]. Electronic Signature(s) Signed: 02/18/2015 4:17:22 PM By: [name] Kanner MD, FACS Previous Signature: 02/18/2015 11:20:38 AM Version By: [name] Kanner MD, FACS Entered By: [name] on 02/18/2015 16:17:22 Manning, [name] ([id number]) -------------------------------------------------------------------------------- SuperBill Details Patient Name: NEILA, TEEM. Date of Service: 02/18/2015 Medical Record Number: [id number] Patient Account Number: [id number] Date of Birth/Sex: [birth year] (49 y.o. 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, C1-C4 incomplete E44.0 Moderate protein-calorie malnutrition Facility Procedures CPT4 Code: 40102725 Description: 11042 - DEB SUBQ TISSUE 20 SQ CM/< ICD-10 Description Diagnosis L89.114 Pressure ulcer of right  upper back, stage 4 L89.154 Pressure ulcer of sacral region, stage 4 G82.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

## 2015-02-18 NOTE — Progress Notes (Signed)
ALICYN, KLANN ([id number]) Visit Report for 02/18/2015 Arrival Information Details Patient Name: [name], [name] Date of Service: 02/18/2015 10:45 AM Medical Record Number: [id number] Patient Account Number: [id number] Date of Birth/Sex: Dec 17, 1965 (49 y.o. Female) Treating RN: Huel Coventry Primary Care Physician: [place], JULIE Other Clinician: Referring Physician: [place], JULIE Treating Physician/Extender: Rudene Re in Treatment: 3 Visit Information History Since Last Visit Added or deleted any medications: No Patient Arrived: Wheel Chair Any new allergies or adverse reactions: No Arrival Time: 10:54 Had a fall or experienced change in No activities of daily living that may affect Accompanied By: boyfriend risk of falls: Transfer Assistance: Manual Signs or symptoms of abuse/neglect since last No Patient Identification Verified: Yes visito Secondary Verification Process Yes Hospitalized since last visit: No Completed: Has Dressing in Place as Prescribed: Yes Patient Has Alerts: No Pain Present Now: No Electronic Signature(s) Signed: 02/18/2015 12:48:10 PM By: Elliot Gurney, RN, BSN, Kim RN, BSN Entered By: Elliot Gurney, RN, BSN, Kim on 02/18/2015 10:55:16 Emile, [name] ([id number]) -------------------------------------------------------------------------------- Encounter Discharge Information Details Patient Name: [name], [name] Date of Service: 02/18/2015 10:45 AM Medical Record Number: [id number] Patient Account Number: [id number] Date of Birth/Sex: 07/27/1965 (49 y.o. Female) Treating RN: Huel Coventry Primary Care Physician: [place], JULIE Other Clinician: Referring Physician: [place], JULIE Treating Physician/Extender: Rudene Re in Treatment: 3 Encounter Discharge Information Items Discharge Pain Level: 0 Discharge Condition: Stable Ambulatory Status: Wheelchair Discharge Destination: Home Transportation: Private Auto Accompanied By: boyfriend Schedule  Follow-up Appointment: Yes Medication Reconciliation completed and provided to Patient/Care Yes Nafisah Runions: Provided on Clinical Summary of Care: 02/18/2015 Form Type Recipient Paper Patient CW Electronic Signature(s) Signed: 02/18/2015 12:48:10 PM By: Elliot Gurney, RN, BSN, Kim RN, BSN Previous Signature: 02/18/2015 11:28:46 AM Version By: Gwenlyn Perking Entered By: Elliot Gurney RN, BSN, Kim on 02/18/2015 11:33:52 Pappalardo, [name] ([id number]) -------------------------------------------------------------------------------- Multi Wound Chart Details Patient Name: [name], [name] Date of Service: 02/18/2015 10:45 AM Medical Record Number: [id number] Patient Account Number: [id number] Date of Birth/Sex: May 10, 1966 (49 y.o. Female) Treating RN: Huel Coventry Primary Care Physician: [place], JULIE Other Clinician: Referring Physician: [place], JULIE Treating Physician/Extender: Rudene Re in Treatment: 3 Vital Signs Height(in): 63 Pulse(bpm): 75 Weight(lbs): 100 Blood Pressure 118/73 (mmHg): Body Mass Index(BMI): 18 Temperature(F): 98.1 Respiratory Rate 18 (breaths/min): Photos: [1:No Photos] [2:No Photos] [N/A:N/A] Wound Location: [1:Right Scapula] [2:Midline Sacrum] [N/A:N/A] Wounding Event: [1:Pressure Injury] [2:Pressure Injury] [N/A:N/A] Primary Etiology: [1:Pressure Ulcer] [2:Pressure Ulcer] [N/A:N/A] Comorbid History: [1:Asthma, Chronic Obstructive Pulmonary Disease (COPD), Paraplegia] [2:Asthma, Chronic Obstructive Pulmonary Disease (COPD), Paraplegia] [N/A:N/A] Date Acquired: [1:09/21/2014] [2:12/02/2007] [N/A:N/A] Weeks of Treatment: [1:3] [2:3] [N/A:N/A] Wound Status: [1:Open] [2:Open] [N/A:N/A] Measurements L x W x D 1.2x1x0.5 [2:2x0.5x0.2] [N/A:N/A] (cm) Area (cm) : [1:0.942] [2:0.785] [N/A:N/A] Volume (cm) : [1:0.471] [2:0.157] [N/A:N/A] % Reduction in Area: [1:23.10%] [2:-4.10%] [N/A:N/A] % Reduction in Volume: 51.90% [2:-109.30%] [N/A:N/A] Starting Position 1  10 (o'clock): Ending Position 1 [1:6] (o'clock): Maximum Distance 1 2.5 (cm): Undermining: [1:Yes] [2:N/A] [N/A:N/A] 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] [N/A:N/A] Exudate Amount: [1:Large] [2:Medium] [N/A:N/A] Exudate Type: [1:Purulent] [2:Serous] [N/A:N/A] Exudate Color: [1:yellow, brown, green] [2:amber] [N/A:N/A] Foul Odor After [1:No] [2:Yes] [N/A:N/A] Cleansing: Yusupov, Hoang M. ([id number]) Odor Anticipated Due to N/A No N/A Product Use: Wound Margin: Flat and Intact Flat and Intact N/A Granulation Amount: Large (67-100%) Small (1-33%) N/A Granulation Quality: Red, Pink Pink N/A Necrotic Amount: None Present (0%) Large (67-100%) N/A Exposed Structures: Bone: Yes Bone: Yes N/A Fascia: No Fascia: No Fat: No Fat: No Tendon: No Tendon: No Muscle: No Muscle:  No Joint: No Joint: No Epithelialization: None None N/A Debridement: Debridement (16109- N/A N/A 11047) Time-Out Taken: Yes N/A N/A Pain Control: Other N/A N/A Tissue Debrided: Fibrin/Slough, Exudates, N/A N/A Subcutaneous Level: Skin/Subcutaneous N/A N/A Tissue Debridement Area (sq 1.2 N/A N/A cm): Instrument: Forceps N/A N/A Bleeding: Minimum N/A N/A Hemostasis Achieved: Pressure N/A N/A Procedural Pain: 0 N/A N/A Post Procedural Pain: 0 N/A N/A Debridement Treatment Procedure was tolerated N/A N/A Response: well Post Debridement 1.2x1x0.1 N/A N/A Measurements L x W x D (cm) Post Debridement 0.094 N/A N/A Volume: (cm) Post Debridement Category/Stage IV N/A N/A Stage: Periwound Skin Texture: Edema: No Scarring: Yes N/A Excoriation: No Edema: No Induration: No Excoriation: No Callus: No Induration: No Crepitus: No Callus: No Fluctuance: No Crepitus: No Friable: No Fluctuance: No Rash: No Friable: No Scarring: No Rash: No Periwound Skin Moist: Yes Moist: Yes N/A Moisture: Maceration: No Maceration: No Dry/Scaly: No Dry/Scaly: No Periwound Skin Color:  N/A [name], [name] ([id number]) Atrophie Blanche: No Atrophie Blanche: No Cyanosis: No Cyanosis: No Ecchymosis: No Ecchymosis: No Erythema: No Erythema: No Hemosiderin Staining: No Hemosiderin Staining: No Mottled: No Mottled: No Pallor: No Pallor: No Rubor: No Rubor: No Temperature: No Abnormality No Abnormality N/A Tenderness on Yes Yes N/A Palpation: Wound Preparation: Ulcer Cleansing: Ulcer Cleansing: N/A Rinsed/Irrigated with Rinsed/Irrigated with Saline Saline Topical Anesthetic Topical Anesthetic Applied: Other: lidocaine Applied: Other: lidocaine 4% 4% Procedures Performed: Debridement N/A N/A Treatment Notes Electronic Signature(s) Signed: 02/18/2015 12:48:10 PM By: Elliot Gurney, RN, BSN, Kim RN, BSN Entered By: Elliot Gurney, RN, BSN, Kim on 02/18/2015 11:32:56 Carreon, [name] ([id number]) -------------------------------------------------------------------------------- Multi-Disciplinary Care Plan Details Patient Name: [name], [name] Date of Service: 02/18/2015 10:45 AM Medical Record Number: [id number] Patient Account Number: [id number] Date of Birth/Sex: [birth year] (49 y.o. Female) Treating RN: Huel Coventry Primary Care Physician: [place], JULIE Other Clinician: Referring Physician: [place], JULIE Treating Physician/Extender: Rudene Re in Treatment: 3 Active Inactive Abuse / Safety / Falls / Self Care Management Nursing Diagnoses: Impaired home maintenance Impaired physical mobility Potential for falls Self care deficit: actual or potential Goals: Patient/caregiver will verbalize understanding of skin care regimen Date Initiated: 01/27/2015 Goal Status: Active Patient/caregiver will verbalize/demonstrate measure taken to improve self care Date Initiated: 01/27/2015 Goal Status: Active Patient/caregiver will verbalize/demonstrate measures taken to improve the patient's personal safety Date Initiated: 01/27/2015 Goal Status: Active Patient/caregiver  will verbalize/demonstrate measures taken to prevent injury and/or falls Date Initiated: 01/27/2015 Goal Status: Active Patient/caregiver will verbalize/demonstrate understanding of what to do in case of emergency Date Initiated: 01/27/2015 Goal Status: Active Interventions: Assess fall risk on admission and as needed Assess: immobility, friction, shearing, incontinence upon admission and as needed Assess impairment of mobility on admission and as needed per policy Assess self care needs on admission and as needed Provide education on basic hygiene Provide education on fall prevention Provide education on personal and home safety Provide education on safe transfers SHERINE, CORTESE ([id number]) Treatment Activities: Education provided on Basic Hygiene : 02/04/2015 Notes: Orientation to the Wound Care Program Nursing Diagnoses: Knowledge deficit related to the wound healing center program Goals: Patient/caregiver will verbalize understanding of the Wound Healing Center Program Date Initiated: 01/27/2015 Goal Status: Active Interventions: Provide education on orientation to the wound center Notes: Pressure Nursing Diagnoses: Knowledge deficit related to causes and risk factors for pressure ulcer development Knowledge deficit related to management of pressures ulcers Potential for impaired tissue integrity related to pressure, friction, moisture, and shear Goals: Patient will remain free from development of  additional pressure ulcers Date Initiated: 01/27/2015 Goal Status: Active Patient will remain free of pressure ulcers Date Initiated: 01/27/2015 Goal Status: Active Patient/caregiver will verbalize risk factors for pressure ulcer development Date Initiated: 01/27/2015 Goal Status: Active Patient/caregiver will verbalize understanding of pressure ulcer management Date Initiated: 01/27/2015 Goal Status: Active Interventions: Assess: immobility, friction, shearing, incontinence  upon admission and as needed Assess offloading mechanisms upon admission and as needed Assess potential for pressure ulcer upon admission and as needed Provide education on pressure ulcers YUDIT, MODESITT. ([id number]) Treatment Activities: Patient referred for pressure reduction/relief devices : 02/18/2015 Patient referred for seating evaluation to ensure proper offloading : 02/18/2015 Notes: Wound/Skin Impairment Nursing Diagnoses: Impaired tissue integrity Knowledge deficit related to smoking impact on wound healing Knowledge deficit related to ulceration/compromised skin integrity Goals: Patient/caregiver will verbalize understanding of skin care regimen Date Initiated: 01/27/2015 Goal Status: Active Ulcer/skin breakdown will have a volume reduction of 30% by week 4 Date Initiated: 01/27/2015 Goal Status: Active Ulcer/skin breakdown will have a volume reduction of 50% by week 8 Date Initiated: 01/27/2015 Goal Status: Active Ulcer/skin breakdown will have a volume reduction of 80% by week 12 Date Initiated: 01/27/2015 Goal Status: Active Ulcer/skin breakdown will heal within 14 weeks Date Initiated: 01/27/2015 Goal Status: Active Interventions: Assess ulceration(s) every visit Provide education on ulcer and skin care Notes: Electronic Signature(s) Signed: 02/18/2015 12:48:10 PM By: Elliot Gurney, RN, BSN, Kim RN, BSN Entered By: Elliot Gurney, RN, BSN, Kim on 02/18/2015 11:32:44 Carlino, [name] ([id number]) -------------------------------------------------------------------------------- Pain Assessment Details Patient Name: [name], [name] Date of Service: 02/18/2015 10:45 AM Medical Record Number: [id number] Patient Account Number: [id number] Date of Birth/Sex: March 23, 1966 (49 y.o. Female) Treating RN: Huel Coventry Primary Care Physician: [place], JULIE Other Clinician: Referring Physician: [place], JULIE Treating Physician/Extender: Rudene Re in Treatment: 3 Active  Problems Location of Pain Severity and Description of Pain Patient Has Paino No Site Locations Pain Management and Medication Current Pain Management: Electronic Signature(s) Signed: 02/18/2015 12:48:10 PM By: Elliot Gurney, RN, BSN, Kim RN, BSN Entered By: Elliot Gurney, RN, BSN, Kim on 02/18/2015 10:55:23 Dimond, [name] ([id number]) -------------------------------------------------------------------------------- Patient/Caregiver Education Details Patient Name: [name], [name] Date of Service: 02/18/2015 10:45 AM Medical Record Number: [id number] Patient Account Number: [id number] Date of Birth/Gender: Sep 24, 1965 (49 y.o. Female) Treating RN: Huel Coventry Primary Care Physician: [place], JULIE Other Clinician: Referring Physician: [place], JULIE Treating Physician/Extender: Rudene Re in Treatment: 3 Education Assessment Education Provided To: Patient Education Topics Provided Pressure: Handouts: Other: offload and change positions every 2 hours or more often Wound/Skin Impairment: Handouts: Other: continue wound care as prescribed Electronic Signature(s) Signed: 02/18/2015 12:48:10 PM By: Elliot Gurney, RN, BSN, Kim RN, BSN Entered By: Elliot Gurney, RN, BSN, Kim on 02/18/2015 11:34:45 Mcdaid, [name] ([id number]) -------------------------------------------------------------------------------- Wound Assessment Details Patient Name: [name], [name] Date of Service: 02/18/2015 10:45 AM Medical Record Number: [id number] Patient Account Number: [id number] Date of Birth/Sex: August 09, 1965 (49 y.o. Female) Treating RN: Huel Coventry Primary Care Physician: [place], JULIE Other Clinician: Referring Physician: [place], JULIE Treating Physician/Extender: Rudene Re in Treatment: 3 Wound Status Wound Number: 1 Primary Pressure Ulcer Etiology: Wound Location: Right Scapula Wound Open Wounding Event: Pressure Injury Status: Date Acquired: 09/21/2014 Comorbid Asthma, Chronic Obstructive Weeks  Of Treatment: 3 History: Pulmonary Disease (COPD), Clustered Wound: No Paraplegia Photos Photo Uploaded By: Elliot Gurney, RN, BSN, Kim on 02/18/2015 14:55:49 Wound Measurements Length: (cm) 1.2 Width: (cm) 1 Depth: (cm) 0.5 Area: (cm) 0.942 Volume: (cm) 0.471 % Reduction in Area: 23.1% % Reduction in Volume: 51.9%  Epithelialization: None Undermining: Yes Starting Position (o'clock): 10 Ending Position (o'clock): 6 Maximum Distance: (cm) 2.5 Wound Description Classification: Category/Stage IV Wound Margin: Flat and Intact Exudate Amount: Large Exudate Type: Purulent Exudate Color: yellow, brown, green Foul Odor After Cleansing: No Wound Bed Granulation Amount: Large (67-100%) Exposed Structure Langstaff, Santanna M. ([id number]) Granulation Quality: Red, Pink Fascia Exposed: No Necrotic Amount: None Present (0%) Fat Layer Exposed: No Tendon Exposed: No Muscle Exposed: No Joint Exposed: No Bone Exposed: Yes Periwound Skin Texture Texture Color No Abnormalities Noted: No No Abnormalities Noted: No Callus: No Atrophie Blanche: No Crepitus: No Cyanosis: No Excoriation: No Ecchymosis: No Fluctuance: No Erythema: No Friable: No Hemosiderin Staining: No Induration: No Mottled: No Localized Edema: No Pallor: No Rash: No Rubor: No Scarring: No Temperature / Pain Moisture Temperature: No Abnormality No Abnormalities Noted: No Tenderness on Palpation: Yes Dry / Scaly: No Maceration: No Moist: Yes Wound Preparation Ulcer Cleansing: Rinsed/Irrigated with Saline Topical Anesthetic Applied: Other: lidocaine 4%, Treatment Notes Wound #1 (Right Scapula) 1. Cleansed with: Clean wound with Normal Saline 2. Anesthetic Topical Lidocaine 4% cream to wound bed prior to debridement 4. Dressing Applied: Aquacel Ag 5. Secondary Dressing Applied Bordered Foam Dressing Electronic Signature(s) Signed: 02/18/2015 12:48:10 PM By: Elliot Gurney, RN, BSN, Kim RN, BSN Entered By: Elliot Gurney,  RN, BSN, Kim on 02/18/2015 11:03:58 Sessa, [name] ([id number]) -------------------------------------------------------------------------------- Wound Assessment Details Patient Name: [name], [name] Date of Service: 02/18/2015 10:45 AM Medical Record Number: [id number] Patient Account Number: [id number] Date of Birth/Sex: [birth year] (49 y.o. Female) Treating RN: Huel Coventry Primary Care Physician: [place], JULIE Other Clinician: Referring Physician: [place], JULIE Treating Physician/Extender: Rudene Re in Treatment: 3 Wound Status Wound Number: 2 Primary Pressure Ulcer Etiology: Wound Location: Midline Sacrum Wound Open Wounding Event: Pressure Injury Status: Date Acquired: 12/02/2007 Comorbid Asthma, Chronic Obstructive Weeks Of Treatment: 3 History: Pulmonary Disease (COPD), Clustered Wound: No Paraplegia Photos Photo Uploaded By: Elliot Gurney, RN, BSN, Kim on 02/18/2015 14:55:59 Wound Measurements Length: (cm) 2 Width: (cm) 0.5 Depth: (cm) 0.2 Area: (cm) 0.785 Volume: (cm) 0.157 % Reduction in Area: -4.1% % Reduction in Volume: -109.3% Epithelialization: None Wound Description Classification: Category/Stage IV Wound Margin: Flat and Intact Exudate Amount: Medium Exudate Type: Serous Exudate Color: amber Foul Odor After Cleansing: Yes Due to Product Use: No Wound Bed Granulation Amount: Small (1-33%) Exposed Structure Granulation Quality: Pink Fascia Exposed: No Necrotic Amount: Large (67-100%) Fat Layer Exposed: No Necrotic Quality: Adherent Slough Tendon Exposed: No Frandsen, Sanika M. ([id number]) Muscle Exposed: No Joint Exposed: No Bone Exposed: Yes Periwound Skin Texture Texture Color No Abnormalities Noted: No No Abnormalities Noted: No Callus: No Atrophie Blanche: No Crepitus: No Cyanosis: No Excoriation: No Ecchymosis: No Fluctuance: No Erythema: No Friable: No Hemosiderin Staining: No Induration: No Mottled: No Localized Edema:  No Pallor: No Rash: No Rubor: No Scarring: Yes Temperature / Pain Moisture Temperature: No Abnormality No Abnormalities Noted: No Tenderness on Palpation: Yes Dry / Scaly: No Maceration: No Moist: Yes Wound Preparation Ulcer Cleansing: Rinsed/Irrigated with Saline Topical Anesthetic Applied: Other: lidocaine 4%, Treatment Notes Wound #2 (Midline Sacrum) 1. Cleansed with: Clean wound with Normal Saline 2. Anesthetic Topical Lidocaine 4% cream to wound bed prior to debridement 4. Dressing Applied: Aquacel Ag 5. Secondary Dressing Applied Bordered Foam Dressing Electronic Signature(s) Signed: 02/18/2015 12:48:10 PM By: Elliot Gurney, RN, BSN, Kim RN, BSN Entered By: Elliot Gurney, RN, BSN, Kim on 02/18/2015 11:18:10 Dalziel, [name] ([id number]) -------------------------------------------------------------------------------- Vitals Details Patient Name: MAKENIZE, MESSMAN. Date of Service: 02/18/2015 10:45  AM Medical Record Number: [id number] Patient Account Number: [id number] Date of Birth/Sex: [birth year] (49 y.o. Female) Treating RN: Huel Coventry Primary Care Physician: [place], JULIE Other Clinician: Referring Physician: [place], JULIE Treating Physician/Extender: Rudene Re in Treatment: 3 Vital Signs Time Taken: 10:55 Temperature (F): 98.1 Height (in): 63 Pulse (bpm): 75 Weight (lbs): 100 Respiratory Rate (breaths/min): 18 Body Mass Index (BMI): 17.7 Blood Pressure (mmHg): 118/73 Reference Range: 80 - 120 mg / dl Electronic Signature(s) Signed: 02/18/2015 12:48:10 PM By: Elliot Gurney, RN, BSN, Kim RN, BSN Entered By: Elliot Gurney, RN, BSN, Kim on 02/18/2015 10:55:44

## 2015-02-25 ENCOUNTER — Encounter: Payer: Medicare Other | Admitting: Surgery

## 2015-02-25 DIAGNOSIS — L89114 Pressure ulcer of right upper back, stage 4: Secondary | ICD-10-CM | POA: Diagnosis not present

## 2015-02-25 NOTE — Progress Notes (Addendum)
HAJAR, PENNINGER ([id number]) Visit Report for 02/25/2015 Chief Complaint Document Details Patient Name: [name], [name] 02/25/2015 11:30 Date of Service: AM Medical Record [id number] Number: Patient Account Number: [id number] 09-17-1965 (49 y.o. Treating RN: Curtis Sites Date of Birth/Sex: Female) Other Clinician: Primary Care Physician: [place], JULIE Treating Evlyn Kanner Referring Physician: [place], JULIE Physician/Extender: Weeks in Treatment: 4 Information Obtained from: Patient Chief Complaint Patient presents to the wound care center for a consult due non healing wound. 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 She comes in for a pressure injury to the right scapular region and the sacral region. She has this for several months. Electronic Signature(s) Signed: 02/25/2015 11:41:42 AM By: Evlyn Kanner MD, FACS Entered By: Evlyn Kanner on 02/25/2015 11:41:42 Perman, [name] ([id number]) -------------------------------------------------------------------------------- HPI Details Patient Name: [name], [name] 02/25/2015 11:30 Date of Service: AM Medical Record [id number] Number: Patient Account Number: [id number] [birth year] (49 y.o. 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 For about 4-5 months she's had a decubitus ulcer on the right scapular region and she has a couple of small areas which have opened out in the region of previous scar tissue in the sacral region. He does not have any other significant comorbidities. She has a good Roho  cushion for her wheelchair and she has a specialized mattress for sleep. She has stopped smoking a year ago. 02/11/2015 -- she has not yet got her appointment with the plastic surgeons at [facility]. They did a x-ray of the sacral region this afternoon just before coming here. 02/18/2015 -- X-ray of the pelvis -- IMPRESSION:No acute osseous injury of the pelvis. If there is clinical concern regarding osteomyelitis, recommend further evaluation with CT. X-ray of the sacrum and coccyx IMPRESSION:No acute osseous abnormality of the sacrum. If there is further clinical concern regarding osteomyelitis of the sacrum, further evaluation with CT is recommended. Electronic Signature(s) Signed: 02/25/2015 11:41:47 AM By: Evlyn Kanner MD, FACS Entered By: Evlyn Kanner on 02/25/2015 11:41:47 [name] ([id number]) -------------------------------------------------------------------------------- Physical Exam Details Patient Name: [name], [name] 02/25/2015 11:30 Date of Service: AM Medical Record [id number] Number: Patient Account Number: [id number] [birth year] (49 y.o. Treating RN: Curtis Sites Date of Birth/Sex: Female) Other Clinician: Primary Care Physician: [place], JULIE Treating Evlyn Kanner Referring Physician: [place], JULIE Physician/Extender: Weeks in Treatment: 4 Constitutional . Pulse regular. Respirations normal and unlabored. Afebrile. . Eyes Nonicteric. Reactive to light. Ears, Nose, Mouth, and Throat Lips, teeth, and gums WNL.Marland Kitchen Moist mucosa without lesions . Neck supple and nontender. No palpable supraclavicular or cervical adenopathy. Normal sized without goiter. Respiratory WNL. No retractions.. Cardiovascular Pedal Pulses WNL. No clubbing, cyanosis or edema. Chest Breasts symmetical and no nipple discharge.. Breast tissue WNL, no masses, lumps, or tenderness.. Lymphatic No adneopathy. No adenopathy. No adenopathy. Musculoskeletal Adexa without tenderness  or enlargement.. Digits and nails w/o clubbing, cyanosis, infection, petechiae, ischemia, or inflammatory conditions.. Integumentary (Hair, Skin) No suspicious lesions. No crepitus or fluctuance. No peri-wound warmth or erythema. No masses.Marland Kitchen Psychiatric Judgement and insight Intact.. No evidence of depression, anxiety, or agitation.. Notes The scapula wound is coming along nicely and there is no debridement to be done today. The sacral  wound is about the same with minimal openings at several edges Electronic Signature(s) Signed: 02/25/2015 11:42:43 AM By: Evlyn Kanner MD, FACS Entered By: Evlyn Kanner on 02/25/2015 11:42:42 Telford, [name] ([id number]) -------------------------------------------------------------------------------- Physician Orders Details Patient Name: [name], [name] 02/25/2015 11:30 Date of Service: AM Medical Record [id number] Number: Patient Account Number: [id number] September 07, 1965 (49 y.o. 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 Wound #2 Midline Sacrum o Cleanse wound with mild soap and water o May Shower, gently pat wound dry prior to applying new dressing. Primary Wound Dressing Wound #1 Right Scapula o Aquacel Ag - rope; lightly pack undermining (11-4) Wound #2 Midline Sacrum o Aquacel Ag - rope Secondary  Dressing Wound #1 Right Scapula o Boardered Foam Dressing Wound #2 Midline Sacrum o Boardered Foam Dressing Dressing Change Frequency Wound #1 Right Scapula o Change dressing every other day. EUPHA, LOBB. ([id number]) Wound #2 Midline Sacrum o Change dressing every other day. Follow-up Appointments Wound #1 Right Scapula o Return Appointment in 1 week. Off-Loading Wound #1 Right Scapula o Turn and reposition every 2 hours Wound #2 Midline Sacrum o Turn and reposition every 2 hours Home Health Wound #1 Right Scapula o Continue Home Health Visits - [facility] o Home Health Nurse may visit PRN to address patientos wound care needs. o FACE TO FACE ENCOUNTER: MEDICARE and MEDICAID PATIENTS: I certify that this patient is under my care and that I had a face-to-face encounter that meets the physician face-to-face encounter requirements with this patient on this date. The encounter with the patient was in whole or in part for the following MEDICAL CONDITION: (primary reason for Home Healthcare) MEDICAL NECESSITY: I certify, that based on my findings, NURSING services are a medically necessary home health service. HOME BOUND STATUS: I certify that my clinical findings support that this patient is homebound (i.e., Due to illness or injury, pt requires aid of supportive devices such as crutches, cane, wheelchairs, walkers, the use of special transportation or the assistance of another person to leave their place of residence. There is a normal inability to leave the home and doing so requires considerable and taxing effort. Other absences are for medical reasons / religious services and are infrequent or of short duration when for other reasons). o If current dressing causes regression in wound condition, may D/C ordered dressing product/s and apply Normal Saline Moist Dressing daily until next Wound Healing Center / Other MD appointment. Notify Wound  Healing Center of regression in wound condition at [phone]. o Please direct any NON-WOUND related issues/requests for orders to patient's Primary Care Physician Wound #2 Midline Sacrum o Continue Home Health Visits - [facility] o Home Health Nurse may visit PRN to address patientos wound care needs. o FACE TO FACE ENCOUNTER: MEDICARE and MEDICAID PATIENTS: I certify that this patient is under my care and that I had a face-to-face encounter that meets the physician face-to-face encounter requirements with this patient on this date. The encounter  with the patient was in whole or in part for the following MEDICAL CONDITION: (primary reason for Home Healthcare) MEDICAL NECESSITY: I certify, that based on my findings, NURSING services are a medically necessary home health service. HOME BOUND STATUS: I certify that my clinical findings support that this patient is homebound (i.e., Due to illness or injury, pt requires aid of supportive devices such as crutches, cane, wheelchairs, walkers, the use of special transportation or the assistance of another person to leave their place of residence. There is a LINH, HEDBERG. ([id number]) normal inability to leave the home and doing so requires considerable and taxing effort. Other absences are for medical reasons / religious services and are infrequent or of short duration when for other reasons). o If current dressing causes regression in wound condition, may D/C ordered dressing product/s and apply Normal Saline Moist Dressing daily until next Wound Healing Center / Other MD appointment. Notify Wound Healing Center of regression in wound condition at [phone]. o Please direct any NON-WOUND related issues/requests for orders to patient's Primary Care Physician Electronic Signature(s) Signed: 02/25/2015 3:28:23 PM By: Elliot Gurney RN, BSN, Kim RN, BSN Signed: 02/25/2015 4:07:43 PM By: Evlyn Kanner MD, FACS Entered By: Elliot Gurney  RN, BSN, Kim on 02/25/2015 11:54:28 Demilio, [name] ([id number]) -------------------------------------------------------------------------------- Problem List Details Patient Name: ANGALA, HILGERS 02/25/2015 11:30 Date of Service: AM Medical Record [id number] Number: Patient Account Number: [id number] 01/10/1966 (49 y.o. 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 Treating RN: Curtis Sites Date of Birth/Sex: Female) Other Clinician: Primary Care Physician: [place], JULIE Treating Evlyn Kanner Referring Physician: [place], JULIE Physician/Extender: Tania Ade in Treatment: 4 Subjective Chief Complaint Information obtained from Patient Patient presents to the wound care center for a consult due non healing wound. 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 She comes in for a pressure injury to the right scapular region and the sacral region. She has this for several months. 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 For about 4-5 months she's had a decubitus ulcer on the right scapular region and she has a couple of small areas which have opened out in the region of previous scar tissue in the sacral region. He does not have any other significant comorbidities. She has a good Roho cushion for her wheelchair and she has a specialized mattress for sleep. She has stopped smoking a year ago. 02/11/2015 -- she has not yet got her appointment with the plastic surgeons at [facility]. They did a x-ray of the sacral region this afternoon just before coming here. 02/18/2015 -- X-ray of the pelvis -- IMPRESSION:No acute osseous injury of the pelvis. If there is clinical concern  regarding osteomyelitis, recommend further evaluation with CT. X-ray of the sacrum and coccyx IMPRESSION:No acute osseous abnormality of the sacrum. If there is further clinical concern regarding osteomyelitis of the sacrum, further evaluation with CT is recommended. Objective Constitutional Pulse regular. Respirations normal and unlabored. Afebrile. Vitals Time Taken: 11:14 AM, Height: 63 in, Weight: 100 lbs, BMI: 17.7, Temperature: 98.2 F, Pulse: 122 Medici, Aleena M. ([id number]) bpm, Respiratory Rate: 18 breaths/min, Blood Pressure: 78/56 mmHg. General Notes: Patient states BP is normal for her. Eyes Nonicteric. Reactive to light. Ears, Nose, Mouth, and Throat Lips, teeth, and gums WNL.Marland Kitchen Moist mucosa without lesions . Neck supple and nontender. No palpable supraclavicular or cervical adenopathy. Normal sized without goiter. Respiratory WNL. No retractions.. Cardiovascular Pedal Pulses WNL. No clubbing, cyanosis or edema. Chest Breasts symmetical and no nipple discharge.. Breast tissue WNL, no masses, lumps, or tenderness.. Lymphatic No adneopathy. No adenopathy.  No adenopathy. Musculoskeletal Adexa without tenderness or enlargement.. Digits and nails w/o clubbing, cyanosis, infection, petechiae, ischemia, or inflammatory conditions.Marland Kitchen Psychiatric Judgement and insight Intact.. No evidence of depression, anxiety, or agitation.. General Notes: The scapula wound is coming along nicely and there is no debridement to be done today. The sacral wound is about the same with minimal openings at several edges Integumentary (Hair, Skin) No suspicious lesions. No crepitus or fluctuance. No peri-wound warmth or erythema. No masses.. Wound #1 status is Open. Original cause of wound was Pressure Injury. The wound is located on the Right Scapula. The wound measures 1.2cm length x 1.1cm width x 0.3cm depth; 1.037cm^2 area and 0.311cm^3 volume. There is bone exposed. There is no tunneling noted, however, there is undermining starting at 11:00 and ending at 4:00 with a maximum distance of 1.2cm. There is a large amount of purulent drainage noted. The wound margin is flat and intact. There is large (67-100%) red, pink granulation within the wound bed. There is no necrotic tissue within the wound bed. The periwound skin appearance exhibited: Moist. The periwound skin appearance did not exhibit: Callus, Crepitus, Excoriation, Fluctuance, Friable, Induration, Localized Edema, Rash, Scarring, Dry/Scaly, Maceration, Atrophie Blanche, Cyanosis, Ecchymosis, Hemosiderin Staining, Mottled, Pallor, Rubor, Erythema. Periwound temperature was noted as No Abnormality. The periwound has tenderness on palpation. Wound #2 status is Open. Original cause of wound was Pressure Injury. The wound is located on the [place]. ([id number]) Midline Sacrum. The wound measures 5cm length x 6cm width x 0.2cm depth; 23.562cm^2 area and 4.712cm^3 volume. There is bone exposed. There is a medium amount of serous drainage noted. The wound margin is flat and intact. There is small (1-33%) pink  granulation within the wound bed. There is a large (67-100%) amount of necrotic tissue within the wound bed including Adherent Slough. The periwound skin appearance exhibited: Scarring, Moist. The periwound skin appearance did not exhibit: Callus, Crepitus, Excoriation, Fluctuance, Friable, Induration, Localized Edema, Rash, Dry/Scaly, Maceration, Atrophie Blanche, Cyanosis, Ecchymosis, Hemosiderin Staining, Mottled, Pallor, Rubor, Erythema. Periwound temperature was noted as No Abnormality. The periwound has tenderness on palpation. General Notes: Three open spots, clustered. Assessment Active Problems ICD-10 L89.114 - Pressure ulcer of right upper back, stage 4 L89.154 - Pressure ulcer of sacral region, stage 4 G82.52 - Quadriplegia, C1-C4 incomplete E44.0 - Moderate protein-calorie malnutrition We will continue packing with silver alginate and a powdered form to be applied over this. Offloading again has been discussed with her, nutritional support and vitamins and a plastic surgery opinion is still awaited. She will come back and see me next week. Plan Wound  Cleansing: Wound #1 Right Scapula: Cleanse wound with mild soap and water May Shower, gently pat wound dry prior to applying new dressing. Wound #2 Midline Sacrum: Cleanse wound with mild soap and water May Shower, gently pat wound dry prior to applying new dressing. Primary Wound Dressing: Wound #1 Right Scapula: Aquacel Ag - rope; lightly pack undermining (11-4) Wound #2 Midline Sacrum: Aquacel Ag - rope Secondary Dressing: Wound #1 Right Scapula: Toves, Faythe M. ([id number]) Boardered Foam Dressing Wound #2 Midline Sacrum: Boardered Foam Dressing Dressing Change Frequency: Wound #1 Right Scapula: Change dressing every other day. Wound #2 Midline Sacrum: Change dressing every other day. Follow-up Appointments: Wound #1 Right Scapula: Return Appointment in 1 week. Off-Loading: Wound #1 Right Scapula: Turn and  reposition every 2 hours Wound #2 Midline Sacrum: Turn and reposition every 2 hours Home Health: Wound #1 Right Scapula: Continue Home Health Visits - [facility] Home Health Nurse may visit PRN to address patient s wound care needs. FACE TO FACE ENCOUNTER: MEDICARE and MEDICAID PATIENTS: I certify that this patient is under my care and that I had a face-to-face encounter that meets the physician face-to-face encounter requirements with this patient on this date. The encounter with the patient was in whole or in part for the following MEDICAL CONDITION: (primary reason for Home Healthcare) MEDICAL NECESSITY: I certify, that based on my findings, NURSING services are a medically necessary home health service. HOME BOUND STATUS: I certify that my clinical findings support that this patient is homebound (i.e., Due to illness or injury, pt requires aid of supportive devices such as crutches, cane, wheelchairs, walkers, the use of special transportation or the assistance of another person to leave their place of residence. There is a normal inability to leave the home and doing so requires considerable and taxing effort. Other absences are for medical reasons / religious services and are infrequent or of short duration when for other reasons). If current dressing causes regression in wound condition, may D/C ordered dressing product/s and apply Normal Saline Moist Dressing daily until next Wound Healing Center / Other MD appointment. Notify Wound Healing Center of regression in wound condition at [phone]. Please direct any NON-WOUND related issues/requests for orders to patient's Primary Care Physician Wound #2 Midline Sacrum: Continue Home Health Visits - [facility] Home Health Nurse may visit PRN to address patient s wound care needs. FACE TO FACE ENCOUNTER: MEDICARE and MEDICAID PATIENTS: I certify that this patient is under my care and that I had a face-to-face  encounter that meets the physician face-to-face encounter requirements with this patient on this date. The encounter with the patient was in whole or in part for the following MEDICAL CONDITION: (primary reason for Home Healthcare) MEDICAL NECESSITY: I certify, that based on my findings, NURSING services are a medically necessary home health service. HOME BOUND STATUS: I certify that my clinical findings support that this patient is homebound (i.e., Due to illness or injury, pt requires aid of supportive devices such as crutches, cane, wheelchairs, walkers, the use of special transportation or the assistance of another person to leave their place of residence. There is a normal inability to leave the home and doing so requires considerable and taxing effort. Other absences are for medical reasons / religious services and are infrequent or of short duration when for other reasons). If current dressing causes regression in wound condition, may D/C ordered dressing product/s and apply Normal Saline Moist Dressing daily until next Wound Healing Center /  Other MD appointment. Notify Wound Healing Center of regression in wound condition at [phone]. Please direct any NON-WOUND related issues/requests for orders to patient's Primary Care Physician EUPHA, LOBB. ([id number]) We will continue packing with silver alginate and a powdered form to be applied over this. Offloading again has been discussed with her, nutritional support and vitamins and a plastic surgery opinion is still awaited. She will come back and see me next week. Electronic Signature(s) Signed: 02/26/2015 2:51:43 PM By: Evlyn Kanner MD, FACS Previous Signature: 02/25/2015 11:43:36 AM Version By: Evlyn Kanner MD, FACS Entered By: Evlyn Kanner on 02/26/2015 14:51:43 Hing, [name] ([id number]) -------------------------------------------------------------------------------- SuperBill Details Patient Name: NATHALEE, SMARR. Date of Service: 02/25/2015 Medical Record Number: [id number] Patient Account Number: [id number] Date of Birth/Sex: 09/14/1965 (48 y.o. 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, C1-C4 incomplete E44.0 Moderate protein-calorie malnutrition Facility Procedures CPT4 Code: [id number] Description: 99213 - WOUND CARE VISIT-LEV 3 EST PT Modifier: Quantity: 1 Physician Procedures CPT4 Code: [id number] Description: 99213 - WC PHYS LEVEL 3 - EST PT ICD-10 Description Diagnosis L89.114 Pressure ulcer of right upper back, stage 4 L89.154 Pressure ulcer of sacral region, 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

## 2015-02-25 NOTE — Progress Notes (Signed)
KORENA, NASS ([id number]) Visit Report for 02/25/2015 Arrival Information Details Patient Name: [name], [name] Date of Service: 02/25/2015 11:30 AM Medical Record Number: [id number] Patient Account Number: [id number] Date of Birth/Sex: 06-05-1966 (49 y.o. Female) Treating [name]: [name] Primary Care Physician: [name] Other Clinician: Referring Physician: [place], [name] Treating Physician/Extender: [name] in Treatment: 4 Visit Information History Since Last Visit Added or deleted any medications: No Patient Arrived: Wheel Chair Any new allergies or adverse reactions: No Arrival Time: 11:12 Had a fall or experienced change in No activities of daily living that may affect Accompanied By: friend risk of falls: Transfer Assistance: Manual Signs or symptoms of abuse/neglect since last No Patient Identification Verified: Yes visito Secondary Verification Process Yes Hospitalized since last visit: No Completed: Has Dressing in Place as Prescribed: Yes Patient Has Alerts: No Pain Present Now: No Electronic Signature(s) Signed: 02/25/2015 3:28:23 PM By: [name] Gurney, [name], BSN, [name], BSN Entered By: [name] Gurney, [name], BSN, [name] on 02/25/2015 11:13:59 [name], [name] ([id number]) -------------------------------------------------------------------------------- Clinic Level of Care Assessment Details Patient Name: [name], [name] Date of Service: 02/25/2015 11:30 AM Medical Record Number: [id number] Patient Account Number: [id number] Date of Birth/Sex: Sep 26, 1965 (49 y.o. Female) Treating [name]: [name] Primary Care Physician: [name] Other Clinician: Referring Physician: [place], [name] Treating Physician/Extender: [name] in Treatment: 4 Clinic Level of Care Assessment Items TOOL 4 Quantity Score []  - Use when only an EandM is performed on FOLLOW-UP visit 0 ASSESSMENTS - Nursing Assessment / Reassessment []  - Reassessment of Co-morbidities (includes  updates in patient status) 0 X - Reassessment of Adherence to Treatment Plan 1 5 ASSESSMENTS - Wound and Skin Assessment / Reassessment []  - Simple Wound Assessment / Reassessment - one wound 0 X - Complex Wound Assessment / Reassessment - multiple wounds 2 5 []  - Dermatologic / Skin Assessment (not related to wound area) 0 ASSESSMENTS - Focused Assessment []  - Circumferential Edema Measurements - multi extremities 0 []  - Nutritional Assessment / Counseling / Intervention 0 []  - Lower Extremity Assessment (monofilament, tuning fork, pulses) 0 []  - Peripheral Arterial Disease Assessment (using hand held doppler) 0 ASSESSMENTS - Ostomy and/or Continence Assessment and Care []  - Incontinence Assessment and Management 0 []  - Ostomy Care Assessment and Management (repouching, etc.) 0 PROCESS - Coordination of Care X - Simple Patient / Family Education for ongoing care 1 15 []  - Complex (extensive) Patient / Family Education for ongoing care 0 X - Staff obtains Chiropractor, Records, Test Results / Process Orders 1 10 []  - Staff telephones HHA, Nursing Homes / Clarify orders / etc 0 []  - Routine Transfer to another Facility (non-emergent condition) 0 Simmers, Thresea M. ([id number]) []  - Routine Hospital Admission (non-emergent condition) 0 []  - New Admissions / Manufacturing engineer / Ordering NPWT, Apligraf, etc. 0 []  - Emergency Hospital Admission (emergent condition) 0 X - Simple Discharge Coordination 1 10 []  - Complex (extensive) Discharge Coordination 0 PROCESS - Special Needs []  - Pediatric / Minor Patient Management 0 []  - Isolation Patient Management 0 []  - Hearing / Language / Visual special needs 0 []  - Assessment of Community assistance (transportation, D/C planning, etc.) 0 []  - Additional assistance / Altered mentation 0 []  - Support Surface(s) Assessment (bed, cushion, seat, etc.) 0 INTERVENTIONS - Wound Cleansing / Measurement []  - Simple Wound Cleansing - one wound 0 X -  Complex Wound Cleansing - multiple wounds 2 5 X - Wound Imaging (photographs - any number of wounds) 1 5 []  -  Wound Tracing (instead of photographs) 0 []  - Simple Wound Measurement - one wound 0 X - Complex Wound Measurement - multiple wounds 2 5 INTERVENTIONS - Wound Dressings X - Small Wound Dressing one or multiple wounds 2 10 []  - Medium Wound Dressing one or multiple wounds 0 []  - Large Wound Dressing one or multiple wounds 0 []  - Application of Medications - topical 0 []  - Application of Medications - injection 0 INTERVENTIONS - Miscellaneous []  - External ear exam 0 [name], [name] ([id number]) []  - Specimen Collection (cultures, biopsies, blood, body fluids, etc.) 0 []  - Specimen(s) / Culture(s) sent or taken to Lab for analysis 0 []  - Patient Transfer (multiple staff / Michiel [name] Lift / Similar devices) 0 []  - Simple Staple / Suture removal (25 or less) 0 []  - Complex Staple / Suture removal (26 or more) 0 []  - Hypo / Hyperglycemic Management (close monitor of Blood Glucose) 0 []  - Ankle / Brachial Index (ABI) - do not check if billed separately 0 X - Vital Signs 1 5 Has the patient been seen at the hospital within the last three years: Yes Total Score: 100 Level Of Care: New/Established - Level 3 Electronic Signature(s) Signed: 02/25/2015 3:28:23 PM By: [name] Gurney, [name], BSN, [name], BSN Entered By: [name] Gurney, [name], BSN, [name] on 02/25/2015 11:51:38 [name], [name] ([id number]) -------------------------------------------------------------------------------- Encounter Discharge Information Details Patient Name: [name] Date of Service: 02/25/2015 11:30 AM Medical Record Number: [id number] Patient Account Number: [id number] Date of Birth/Sex: [birth year] (49 y.o. Female) Treating [name]: [name] Primary Care Physician: [name] Other Clinician: Referring Physician: [place], [name] Treating Physician/Extender: [name] in Treatment: 4 Encounter Discharge  Information Items Discharge Pain Level: 0 Discharge Condition: Stable Ambulatory Status: Wheelchair Discharge Destination: Home Transportation: Other Accompanied By: friend Schedule Follow-up Appointment: Yes Medication Reconciliation completed and provided to Patient/Care Yes Jessee Newnam: Provided on Clinical Summary of Care: 02/25/2015 Form Type Recipient Paper Patient CW Electronic Signature(s) Signed: 02/25/2015 3:28:23 PM By: [name] Gurney [name], BSN, [name], BSN Previous Signature: 02/25/2015 11:45:36 AM Version By: Gwenlyn Perking Entered By: [name] Gurney [name], BSN, [name] on 02/25/2015 11:52:50 [name], [name] ([id number]) -------------------------------------------------------------------------------- Multi Wound Chart Details Patient Name: [name], BOMBA. Date of Service: 02/25/2015 11:30 AM Medical Record Number: [id number] Patient Account Number: [id number] Date of Birth/Sex: 1965-12-14 (49 y.o. Female) Treating [name]: [name] Primary Care Physician: [name] Other Clinician: Referring Physician: [place], [name] Treating Physician/Extender: [name] in Treatment: 4 Vital Signs Height(in): 63 Pulse(bpm): 122 Weight(lbs): 100 Blood Pressure 78/56 (mmHg): Body Mass Index(BMI): 18 Temperature(F): 98.2 Respiratory Rate 18 (breaths/min): Photos: [1:No Photos] [2:No Photos] [N/A:N/A] Wound Location: [1:Right Scapula] [2:Sacrum - Midline] [N/A:N/A] Wounding Event: [1:Pressure Injury] [2:Pressure Injury] [N/A:N/A] Primary Etiology: [1:Pressure Ulcer] [2:Pressure Ulcer] [N/A:N/A] Comorbid History: [1:Asthma, Chronic Obstructive Pulmonary Disease (COPD), Paraplegia] [2:Asthma, Chronic Obstructive Pulmonary Disease (COPD), Paraplegia] [N/A:N/A] Date Acquired: [1:09/21/2014] [2:12/02/2007] [N/A:N/A] Weeks of Treatment: [1:4] [2:4] [N/A:N/A] Wound Status: [1:Open] [2:Open] [N/A:N/A] Measurements L x W x D 1.2x1.1x0.3 [2:5x6x0.2] [N/A:N/A] (cm) Area (cm) : [1:1.037] [2:23.562]  [N/A:N/A] Volume (cm) : [1:0.311] [2:4.712] [N/A:N/A] % Reduction in Area: [1:15.30%] [2:-3024.90%] [N/A:N/A] % Reduction in Volume: 68.30% [2:-6182.70%] [N/A:N/A] Starting Position 1 11 (o'clock): Ending Position 1 [1:4] (o'clock): Maximum Distance 1 1.2 (cm): Undermining: [1:Yes] [2:N/A] [N/A:N/A] 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] [N/A:N/A] Exudate Amount: [1:Large] [2:Medium] [N/A:N/A] Exudate Type: [1:Purulent] [2:Serous] [N/A:N/A] Exudate Color: [1:yellow, brown, green] [2:amber] [N/A:N/A] Foul Odor After [1:No] [2:Yes] [N/A:N/A] Cleansing: Merced, Lunabella M. ([id number]) Odor Anticipated Due to  N/A No N/A Product Use: Wound Margin: Flat and Intact Flat and Intact N/A Granulation Amount: Large (67-100%) Small (1-33%) N/A Granulation Quality: Red, Pink Pink N/A Necrotic Amount: None Present (0%) Large (67-100%) N/A Exposed Structures: Bone: Yes Bone: Yes N/A Fascia: No Fascia: No Fat: No Fat: No Tendon: No Tendon: No Muscle: No Muscle: No Joint: No Joint: No Epithelialization: None None N/A Periwound Skin Texture: Edema: No Scarring: Yes N/A Excoriation: No Edema: No Induration: No Excoriation: No Callus: No Induration: No Crepitus: No Callus: No Fluctuance: No Crepitus: No Friable: No Fluctuance: No Rash: No Friable: No Scarring: No Rash: No Periwound Skin Moist: Yes Moist: Yes N/A Moisture: Maceration: No Maceration: No Dry/Scaly: No Dry/Scaly: No Periwound Skin Color: Atrophie Blanche: No Atrophie Blanche: No N/A Cyanosis: No Cyanosis: No Ecchymosis: No Ecchymosis: No Erythema: No Erythema: No Hemosiderin Staining: No Hemosiderin Staining: No Mottled: No Mottled: No Pallor: No Pallor: No Rubor: No Rubor: No Temperature: No Abnormality No Abnormality N/A Tenderness on Yes Yes N/A Palpation: Wound Preparation: Ulcer Cleansing: Ulcer Cleansing: N/A Rinsed/Irrigated with Rinsed/Irrigated with Saline  Saline Topical Anesthetic Topical Anesthetic Applied: Other: lidocaine Applied: Other: lidocaine 4% 4% Assessment Notes: N/A Three open spots, N/A clustered. Treatment Notes Electronic Signature(s) [name], [name] ([id number]) Signed: 02/25/2015 3:28:23 PM By: [name] Gurney, [name], BSN, [name], BSN Entered By: [name] Gurney, [name], BSN, [name] on 02/25/2015 11:28:47 [name] -------------------------------------------------------------------------------- Multi-Disciplinary Care Plan Details Patient Name: [name], [name] Date of Service: 02/25/2015 11:30 AM Medical Record Number: [id number] Patient Account Number: [id number] Date of Birth/Sex: 02/19/1966 (49 y.o. Female) Treating [name]: [name] Primary Care Physician: [name] Other Clinician: Referring Physician: [place], [name] Treating Physician/Extender: [name] in Treatment: 4 Active Inactive Abuse / Safety / Falls / Self Care Management Nursing Diagnoses: Impaired home maintenance Impaired physical mobility Potential for falls Self care deficit: actual or potential Goals: Patient/caregiver will verbalize understanding of skin care regimen Date Initiated: 01/27/2015 Goal Status: Active Patient/caregiver will verbalize/demonstrate measure taken to improve self care Date Initiated: 01/27/2015 Goal Status: Active Patient/caregiver will verbalize/demonstrate measures taken to improve the patient's personal safety Date Initiated: 01/27/2015 Goal Status: Active Patient/caregiver will verbalize/demonstrate measures taken to prevent injury and/or falls Date Initiated: 01/27/2015 Goal Status: Active Patient/caregiver will verbalize/demonstrate understanding of what to do in case of emergency Date Initiated: 01/27/2015 Goal Status: Active Interventions: Assess fall risk on admission and as needed Assess: immobility, friction, shearing, incontinence upon admission and as needed Assess impairment of mobility on  admission and as needed per policy Assess self care needs on admission and as needed Provide education on basic hygiene Provide education on fall prevention Provide education on personal and home safety Provide education on safe transfers JANNETT, SCHMALL ([id number]) Treatment Activities: Education provided on Basic Hygiene : 02/04/2015 Notes: Orientation to the Wound Care Program Nursing Diagnoses: Knowledge deficit related to the wound healing center program Goals: Patient/caregiver will verbalize understanding of the Wound Healing Center Program Date Initiated: 01/27/2015 Goal Status: Active Interventions: Provide education on orientation to the wound center Notes: Pressure Nursing Diagnoses: Knowledge deficit related to causes and risk factors for pressure ulcer development Knowledge deficit related to management of pressures ulcers Potential for impaired tissue integrity related to pressure, friction, moisture, and shear Goals: Patient will remain free from development of additional pressure ulcers Date Initiated: 01/27/2015 Goal Status: Active Patient will remain free of pressure ulcers Date Initiated: 01/27/2015 Goal Status: Active Patient/caregiver will verbalize risk factors for pressure ulcer development Date Initiated: 01/27/2015  Goal Status: Active Patient/caregiver will verbalize understanding of pressure ulcer management Date Initiated: 01/27/2015 Goal Status: Active Interventions: Assess: immobility, friction, shearing, incontinence upon admission and as needed Assess offloading mechanisms upon admission and as needed Assess potential for pressure ulcer upon admission and as needed Provide education on pressure ulcers [name], [name] ([id number]) Treatment Activities: Patient referred for pressure reduction/relief devices : 02/25/2015 Patient referred for seating evaluation to ensure proper offloading : 02/25/2015 Notes: Wound/Skin Impairment Nursing  Diagnoses: Impaired tissue integrity Knowledge deficit related to smoking impact on wound healing Knowledge deficit related to ulceration/compromised skin integrity Goals: Patient/caregiver will verbalize understanding of skin care regimen Date Initiated: 01/27/2015 Goal Status: Active Ulcer/skin breakdown will have a volume reduction of 30% by week 4 Date Initiated: 01/27/2015 Goal Status: Active Ulcer/skin breakdown will have a volume reduction of 50% by week 8 Date Initiated: 01/27/2015 Goal Status: Active Ulcer/skin breakdown will have a volume reduction of 80% by week 12 Date Initiated: 01/27/2015 Goal Status: Active Ulcer/skin breakdown will heal within 14 weeks Date Initiated: 01/27/2015 Goal Status: Active Interventions: Assess ulceration(s) every visit Provide education on ulcer and skin care Notes: Electronic Signature(s) Signed: 02/25/2015 3:28:23 PM By: [name] Gurney, [name], BSN, [name], BSN Entered By: [name] Gurney, [name], BSN, [name] on 02/25/2015 11:26:20 [name], [name] ([id number]) -------------------------------------------------------------------------------- Pain Assessment Details Patient Name: ANINA, SCHNAKE. Date of Service: 02/25/2015 11:30 AM Medical Record Number: [id number] Patient Account Number: [id number] Date of Birth/Sex: 10-Nov-1965 (49 y.o. Female) Treating [name]: [name] Primary Care Physician: [name] Other Clinician: Referring Physician: [place], [name] Treating Physician/Extender: [name] in Treatment: 4 Active Problems Location of Pain Severity and Description of Pain Patient Has Paino No Site Locations Pain Management and Medication Current Pain Management: Electronic Signature(s) Signed: 02/25/2015 3:28:23 PM By: [name] Gurney, [name], BSN, [name], BSN Entered By: [name] Gurney, [name], BSN, [name] on 02/25/2015 11:14:06 [name], [name] ([id number]) -------------------------------------------------------------------------------- Patient/Caregiver Education  Details Patient Name: RICHIE, VADALA. Date of Service: 02/25/2015 11:30 AM Medical Record Number: [id number] Patient Account Number: [id number] Date of Birth/Gender: 05-15-1966 (49 y.o. Female) Treating [name]: [name] Primary Care Physician: [name] Other Clinician: Referring Physician: [place], [name] Treating Physician/Extender: [name] in Treatment: 4 Education Assessment Education Provided To: Patient Education Topics Provided Wound/Skin Impairment: Handouts: Caring for Your Ulcer, Other: continue wound care as prescribed Methods: Demonstration, Explain/Verbal Responses: State content correctly Electronic Signature(s) Signed: 02/25/2015 3:28:23 PM By: [name] Gurney, [name], BSN, [name], BSN Entered By: [name] Gurney, [name], BSN, [name] on 02/25/2015 11:53:14 [place], [name] ([id number]) -------------------------------------------------------------------------------- Wound Assessment Details Patient Name: CLOMA, RAHRIG. Date of Service: 02/25/2015 11:30 AM Medical Record Number: [id number] Patient Account Number: [id number] Date of Birth/Sex: [birth year] (49 y.o. Female) Treating [name]: [name] Primary Care Physician: [name] Other Clinician: Referring Physician: [place], [name] Treating Physician/Extender: [name] in Treatment: 4 Wound Status Wound Number: 1 Primary Pressure Ulcer Etiology: Wound Location: Right Scapula Wound Open Wounding Event: Pressure Injury Status: Date Acquired: 09/21/2014 Comorbid Asthma, Chronic Obstructive Weeks Of Treatment: 4 History: Pulmonary Disease (COPD), Clustered Wound: No Paraplegia Photos Photo Uploaded By: [name] Gurney, [name], BSN, [name] on 02/25/2015 15:21:32 Wound Measurements Length: (cm) 1.2 Width: (cm) 1.1 Depth: (cm) 0.3 Area: (cm) 1.037 Volume: (cm) 0.311 % Reduction in Area: 15.3% % Reduction in Volume: 68.3% Epithelialization: None Tunneling: No Undermining: Yes Starting Position (o'clock): 11 Ending  Position (o'clock): 4 Maximum Distance: (cm) 1.2 Wound Description Classification: Category/Stage IV Wound Margin: Flat and Intact Exudate Amount: Large Exudate Type: Purulent Exudate Color:  yellow, brown, green Foul Odor After Cleansing: No Wound Bed Pixler, Jakira M. ([id number]) Granulation Amount: Large (67-100%) Exposed Structure Granulation Quality: Red, Pink Fascia Exposed: No Necrotic Amount: None Present (0%) Fat Layer Exposed: No Tendon Exposed: No Muscle Exposed: No Joint Exposed: No Bone Exposed: Yes Periwound Skin Texture Texture Color No Abnormalities Noted: No No Abnormalities Noted: No Callus: No Atrophie Blanche: No Crepitus: No Cyanosis: No Excoriation: No Ecchymosis: No Fluctuance: No Erythema: No Friable: No Hemosiderin Staining: No Induration: No Mottled: No Localized Edema: No Pallor: No Rash: No Rubor: No Scarring: No Temperature / Pain Moisture Temperature: No Abnormality No Abnormalities Noted: No Tenderness on Palpation: Yes Dry / Scaly: No Maceration: No Moist: Yes Wound Preparation Ulcer Cleansing: Rinsed/Irrigated with Saline Topical Anesthetic Applied: Other: lidocaine 4%, Treatment Notes Wound #1 (Right Scapula) 1. Cleansed with: Clean wound with Normal Saline 2. Anesthetic Topical Lidocaine 4% cream to wound bed prior to debridement 4. Dressing Applied: Aquacel Ag 5. Secondary Dressing Applied Bordered Foam Dressing Electronic Signature(s) Signed: 02/25/2015 3:28:23 PM By: [name] Gurney, [name], BSN, [name], BSN Entered By: [name] Gurney, [name], BSN, [name] on 02/25/2015 11:23:19 Nickless, [name] ([id number]) -------------------------------------------------------------------------------- Wound Assessment Details Patient Name: TYTIANNA, GREENLEY. Date of Service: 02/25/2015 11:30 AM Medical Record Number: [id number] Patient Account Number: [id number] Date of Birth/Sex: 03/26/1966 (48 y.o. Female) Treating [name]: [name] Primary Care Physician:  [name] Other Clinician: Referring Physician: [place], [name] Treating Physician/Extender: [name] in Treatment: 4 Wound Status Wound Number: 2 Primary Pressure Ulcer Etiology: Wound Location: Sacrum - Midline Wound Open Wounding Event: Pressure Injury Status: Date Acquired: 12/02/2007 Comorbid Asthma, Chronic Obstructive Weeks Of Treatment: 4 History: Pulmonary Disease (COPD), Clustered Wound: No Paraplegia Photos Photo Uploaded By: [name] Gurney, [name], BSN, [name] on 02/25/2015 15:21:33 Wound Measurements Length: (cm) 5 Width: (cm) 6 Depth: (cm) 0.2 Area: (cm) 23.562 Volume: (cm) 4.712 % Reduction in Area: -3024.9% % Reduction in Volume: -6182.7% Epithelialization: None Wound Description Classification: Category/Stage IV Wound Margin: Flat and Intact Exudate Amount: Medium Exudate Type: Serous Exudate Color: amber Foul Odor After Cleansing: Yes Due to Product Use: No Wound Bed Granulation Amount: Small (1-33%) Exposed Structure Granulation Quality: Pink Fascia Exposed: No Necrotic Amount: Large (67-100%) Fat Layer Exposed: No Necrotic Quality: Adherent Slough Tendon Exposed: No Toepfer, Haydan M. ([id number]) Muscle Exposed: No Joint Exposed: No Bone Exposed: Yes Periwound Skin Texture Texture Color No Abnormalities Noted: No No Abnormalities Noted: No Callus: No Atrophie Blanche: No Crepitus: No Cyanosis: No Excoriation: No Ecchymosis: No Fluctuance: No Erythema: No Friable: No Hemosiderin Staining: No Induration: No Mottled: No Localized Edema: No Pallor: No Rash: No Rubor: No Scarring: Yes Temperature / Pain Moisture Temperature: No Abnormality No Abnormalities Noted: No Tenderness on Palpation: Yes Dry / Scaly: No Maceration: No Moist: Yes Wound Preparation Ulcer Cleansing: Rinsed/Irrigated with Saline Topical Anesthetic Applied: Other: lidocaine 4%, Assessment Notes Three open spots, clustered. Treatment Notes Wound #2  (Midline Sacrum) 1. Cleansed with: Clean wound with Normal Saline 2. Anesthetic Topical Lidocaine 4% cream to wound bed prior to debridement 4. Dressing Applied: Aquacel Ag 5. Secondary Dressing Applied Bordered Foam Dressing Electronic Signature(s) Signed: 02/25/2015 3:28:23 PM By: [name] Gurney, [name], BSN, [name], BSN Entered By: [name] Gurney, [name], BSN, [name] on 02/25/2015 11:25:27 Belluomini, [name] ([id number]) -------------------------------------------------------------------------------- Vitals Details Patient Name: KENIYAH, GELINAS. Date of Service: 02/25/2015 11:30 AM Medical Record Number: [id number] Patient Account Number: [id number] Date of Birth/Sex: 1966/05/15 (49 y.o. Female) Treating [name]: [name] Primary Care Physician: [name] Other Clinician:  Referring Physician: [place], [name] Treating Physician/Extender: [name] in Treatment: 4 Vital Signs Time Taken: 11:14 Temperature (F): 98.2 Height (in): 63 Pulse (bpm): 122 Weight (lbs): 100 Respiratory Rate (breaths/min): 18 Body Mass Index (BMI): 17.7 Blood Pressure (mmHg): 78/56 Reference Range: 80 - 120 mg / dl Notes Patient states BP is normal for her. Electronic Signature(s) Signed: 02/25/2015 3:28:23 PM By: [name] Gurney, [name], BSN, [name], BSN Entered By: [name] Gurney, [name], BSN, [name] on 02/25/2015 11:14:51

## 2015-03-04 ENCOUNTER — Ambulatory Visit: Payer: Medicare Other | Admitting: Surgery

## 2015-03-11 ENCOUNTER — Encounter: Payer: Medicare Other | Attending: Surgery | Admitting: Surgery

## 2015-03-11 DIAGNOSIS — L89154 Pressure ulcer of sacral region, stage 4: Secondary | ICD-10-CM | POA: Insufficient documentation

## 2015-03-11 DIAGNOSIS — L89114 Pressure ulcer of right upper back, stage 4: Secondary | ICD-10-CM | POA: Insufficient documentation

## 2015-03-11 DIAGNOSIS — G8252 Quadriplegia, C1-C4 incomplete: Secondary | ICD-10-CM | POA: Insufficient documentation

## 2015-03-11 DIAGNOSIS — E440 Moderate protein-calorie malnutrition: Secondary | ICD-10-CM | POA: Diagnosis not present

## 2015-03-12 NOTE — Progress Notes (Signed)
[name], [name] ([id number]) Visit Report for 03/11/2015 Chief Complaint Document Details Patient Name: [name], [name] Date of Service: 03/11/2015 10:00 AM Medical Record Number: [id number] Patient Account Number: [id number] Date of Birth/Sex: [birth year] (49 y.o. Female) Treating RN: Curtis Sites Primary Care Physician: [place], JULIE Other Clinician: Referring Physician: [place], JULIE Treating Physician/Extender: Rudene Re in Treatment: 6 Information Obtained from: Patient Chief Complaint Patient presents to the wound care center for a consult due non healing wound. 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 She comes in for a pressure injury to the right scapular region and the sacral region. She has this for several months. Electronic Signature(s) Signed: 03/11/2015 10:37:22 AM By: Evlyn Kanner MD, FACS Entered By: Evlyn Kanner on 03/11/2015 10:37:22 [name], [name] ([id number]) -------------------------------------------------------------------------------- HPI Details Patient Name: [name], [name] Date of Service: 03/11/2015 10:00 AM Medical Record Number: [id number] Patient Account Number: [id number] Date of Birth/Sex: 10-Oct-1965 (49 y.o. Female) Treating RN: Curtis Sites Primary Care Physician: [place], JULIE Other Clinician: Referring Physician: [place], JULIE Treating Physician/Extender: Rudene Re in Treatment: 6 History of Present Illness HPI Description: 49 year old patient was had quadriparesis for the last 49 years and has had previous extensive surgery and reconstruction at [facility]. For about 4-5 months she's had a decubitus ulcer on the right scapular region and she has a couple of small areas which have opened out in the region of previous scar tissue in the sacral region. He does not have any other significant comorbidities. She has a good Roho cushion for her  wheelchair and she has a specialized mattress for sleep. She has stopped smoking a year ago. 02/11/2015 -- she has not yet got her appointment with the plastic surgeons at [facility]. They did a x-ray of the sacral region this afternoon just before coming here. 02/18/2015 -- X-ray of the pelvis -- IMPRESSION:No acute osseous injury of the pelvis. If there is clinical concern regarding osteomyelitis, recommend further evaluation with CT. X-ray of the sacrum and coccyx IMPRESSION:No acute osseous abnormality of the sacrum. If there is further clinical concern regarding osteomyelitis of the sacrum, further evaluation with CT is recommended. 03/11/2015 -- she has yet to obtain the appointment with the plastic surgeons at [facility]. She says she will be diligent and follow-up on this. Electronic Signature(s) Signed: 03/11/2015 10:37:48 AM By: Evlyn Kanner MD, FACS Entered By: Evlyn Kanner on 03/11/2015 10:37:48 [name], [name] ([id number]) -------------------------------------------------------------------------------- Physical Exam Details Patient Name: [name], [name] Date of Service: 03/11/2015 10:00 AM Medical Record Number: [id number] Patient Account Number: [id number] Date of Birth/Sex: 1966-01-28 (49 y.o. Female) Treating RN: Curtis Sites Primary Care Physician: [place], JULIE Other Clinician: Referring Physician: [place], JULIE Treating Physician/Extender: Rudene Re in Treatment: 6 Constitutional . Pulse regular. Respirations normal and unlabored. Afebrile. . Eyes Nonicteric. Reactive to light. Ears, Nose, Mouth, and Throat Lips, teeth, and gums WNL.Marland Kitchen Moist mucosa without lesions . Neck supple and nontender. No palpable supraclavicular or cervical adenopathy. Normal sized without goiter. Respiratory WNL. No retractions.. Breath sounds WNL, No rubs, rales, rhonchi, or wheeze.. Cardiovascular Heart rhythm and rate regular, no murmur or gallop.. Pedal Pulses WNL.  No clubbing, cyanosis or edema. Lymphatic No adneopathy. No adenopathy. No adenopathy. Musculoskeletal Adexa without tenderness or enlargement.. Digits and nails w/o clubbing, cyanosis, infection, petechiae, ischemia, or inflammatory conditions.. Integumentary (Hair, Skin) No suspicious lesions. No crepitus or fluctuance. No peri-wound warmth or erythema. No masses.Marland Kitchen Psychiatric Judgement  and insight Intact.. No evidence of depression, anxiety, or agitation.. Notes The right scapula wound is getting smaller and the undermining is between the 11 and 5:00 position. The sacral wound is about the same. There is bone protruding at one small area at about the 6:00 position. Electronic Signature(s) Signed: 03/11/2015 10:38:32 AM By: Evlyn Kanner MD, FACS Entered By: Evlyn Kanner on 03/11/2015 10:38:31 [name] ([id number]) -------------------------------------------------------------------------------- Physician Orders Details Patient Name: [name], [name] Date of Service: 03/11/2015 10:00 AM Medical Record Number: [id number] Patient Account Number: [id number] Date of Birth/Sex: [birth year] (49 y.o. Female) Treating RN: Curtis Sites Primary Care Physician: [place], JULIE Other Clinician: Referring Physician: [place], JULIE Treating Physician/Extender: Rudene Re in Treatment: 6 Verbal / Phone Orders: Yes Clinician: Curtis Sites Read Back and Verified: Yes Diagnosis Coding Wound Cleansing Wound #1 Right Scapula o Cleanse wound with mild soap and water o May Shower, gently pat wound dry prior to applying new dressing. Wound #2 Midline Sacrum o Cleanse wound with mild soap and water o May Shower, gently pat wound dry prior to applying new dressing. Primary Wound Dressing Wound #1 Right Scapula o Aquacel Ag - rope; lightly pack undermining (11-5) Wound #2 Midline Sacrum o Aquacel Ag - rope Secondary Dressing Wound #1 Right Scapula o Boardered Foam  Dressing Wound #2 Midline Sacrum o Boardered Foam Dressing Dressing Change Frequency Wound #1 Right Scapula o Change dressing every other day. Wound #2 Midline Sacrum o Change dressing every other day. Follow-up Appointments Wound #1 Right Scapula o Return Appointment in 1 week. Off-Loading Wound #1 Right Scapula [name], [name] ([id number]) o Turn and reposition every 2 hours Wound #2 Midline Sacrum o Turn and reposition every 2 hours Home Health Wound #1 Right Scapula o Continue Home Health Visits - [facility] o Home Health Nurse may visit PRN to address patientos wound care needs. o FACE TO FACE ENCOUNTER: MEDICARE and MEDICAID PATIENTS: I certify that this patient is under my care and that I had a face-to-face encounter that meets the physician face-to-face encounter requirements with this patient on this date. The encounter with the patient was in whole or in part for the following MEDICAL CONDITION: (primary reason for Home Healthcare) MEDICAL NECESSITY: I certify, that based on my findings, NURSING services are a medically necessary home health service. HOME BOUND STATUS: I certify that my clinical findings support that this patient is homebound (i.e., Due to illness or injury, pt requires aid of supportive devices such as crutches, cane, wheelchairs, walkers, the use of special transportation or the assistance of another person to leave their place of residence. There is a normal inability to leave the home and doing so requires considerable and taxing effort. Other absences are for medical reasons / religious services and are infrequent or of short duration when for other reasons). o If current dressing causes regression in wound condition, may D/C ordered dressing product/s and apply Normal Saline Moist Dressing daily until next Wound Healing Center / Other MD appointment. Notify Wound Healing Center of regression in wound condition at  [phone]. o Please direct any NON-WOUND related issues/requests for orders to patient's Primary Care Physician Wound #2 Midline Sacrum o Continue Home Health Visits - [facility] o Home Health Nurse may visit PRN to address patientos wound care needs. o FACE TO FACE ENCOUNTER: MEDICARE and MEDICAID PATIENTS: I certify that this patient is under my care and that I had a face-to-face encounter that meets the physician face-to-face encounter requirements  with this patient on this date. The encounter with the patient was in whole or in part for the following MEDICAL CONDITION: (primary reason for Home Healthcare) MEDICAL NECESSITY: I certify, that based on my findings, NURSING services are a medically necessary home health service. HOME BOUND STATUS: I certify that my clinical findings support that this patient is homebound (i.e., Due to illness or injury, pt requires aid of supportive devices such as crutches, cane, wheelchairs, walkers, the use of special transportation or the assistance of another person to leave their place of residence. There is a normal inability to leave the home and doing so requires considerable and taxing effort. Other absences are for medical reasons / religious services and are infrequent or of short duration when for other reasons). o If current dressing causes regression in wound condition, may D/C ordered dressing product/s and apply Normal Saline Moist Dressing daily until next Wound Healing Center / Other MD appointment. Notify Wound Healing Center of regression in wound condition at [phone]. o Please direct any NON-WOUND related issues/requests for orders to patient's Primary Care Physician [name], [name] ([id number]) Electronic Signature(s) Signed: 03/11/2015 4:23:40 PM By: Evlyn Kanner MD, FACS Signed: 03/11/2015 5:13:06 PM By: Curtis Sites Entered By: Curtis Sites on 03/11/2015 10:21:42 Sterkel, [name]  ([id number]) -------------------------------------------------------------------------------- Problem List Details Patient Name: RAVAN, SCHLEMMER. Date of Service: 03/11/2015 10:00 AM Medical Record Number: [id number] Patient Account Number: [id number] Date of Birth/Sex: 1966-04-19 (49 y.o. 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 Date of Service: 03/11/2015 10:00 AM Medical Record Number: [id number] Patient Account Number: [id number] Date of Birth/Sex: June 03, 1966 (49 y.o. Female) Treating RN: Curtis Sites Primary Care Physician: [place], JULIE Other Clinician: Referring Physician: [place], JULIE Treating Physician/Extender: Rudene Re in Treatment: 6 Subjective Chief Complaint Information obtained from Patient Patient presents to the wound care center for a consult due non healing wound. 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 She comes in for a pressure injury to the right scapular region and the sacral region. She has this for several months. History of Present Illness (HPI) 49 year old patient was had quadriparesis for the last  7 years and has had previous extensive surgery and reconstruction at [facility]. For about 4-5 months she's had a decubitus ulcer on the right scapular region and she has a couple of small areas which have opened out in the region of previous scar tissue in the sacral region. He does not have any other significant comorbidities. She has a good Roho cushion for her wheelchair and she has a specialized mattress for sleep. She has stopped smoking a year ago. 02/11/2015 -- she has not yet got her appointment with the plastic surgeons at [facility]. They did a x-ray of the sacral region this afternoon just before coming here. 02/18/2015 -- X-ray of the pelvis -- IMPRESSION:No acute osseous injury of the pelvis. If there is  clinical concern regarding osteomyelitis, recommend further evaluation with CT. X-ray of the sacrum and coccyx IMPRESSION:No acute osseous abnormality of the sacrum. If there is further clinical concern regarding osteomyelitis of the sacrum, further evaluation with CT is recommended. 03/11/2015 -- she has yet to obtain the appointment with the plastic surgeons at [facility] She says she will be diligent and follow-up on this. Objective Constitutional Pulse regular. Respirations normal and unlabored. Afebrile. Vitals Time Taken: 10:05 AM, Height: 63 in, Weight: 100 lbs, BMI: 17.7, Temperature: 98.5 F, Pulse: 81 [name], [name] ([id number]) bpm, Respiratory Rate: 18 breaths/min, Blood Pressure: 104/72 mmHg. Eyes Nonicteric. Reactive to light. Ears, Nose, Mouth, and Throat Lips, teeth, and gums WNL.Marland Kitchen Moist mucosa without lesions . Neck supple and nontender. No palpable supraclavicular or cervical adenopathy. Normal sized without goiter. Respiratory WNL. No retractions.. Breath sounds WNL, No rubs, rales, rhonchi, or wheeze.. Cardiovascular Heart rhythm and rate regular, no murmur or gallop.. Pedal Pulses WNL. No clubbing, cyanosis or  edema. Lymphatic No adneopathy. No adenopathy. No adenopathy. Musculoskeletal Adexa without tenderness or enlargement.. Digits and nails w/o clubbing, cyanosis, infection, petechiae, ischemia, or inflammatory conditions.Marland Kitchen Psychiatric Judgement and insight Intact.. No evidence of depression, anxiety, or agitation.. General Notes: The right scapula wound is getting smaller and the undermining is between the 11 and 5:00 position. The sacral wound is about the same. There is bone protruding at one small area at about the 6:00 position. Integumentary (Hair, Skin) No suspicious lesions. No crepitus or fluctuance. No peri-wound warmth or erythema. No masses.. Wound #1 status is Open. Original cause of wound was Pressure Injury. The wound is located on the Right Scapula. The wound measures 0.9cm length x 0.6cm width x 0.4cm depth; 0.424cm^2 area and 0.17cm^3 volume. There is bone exposed. There is undermining starting at 11:00 and ending at 5:00 with a maximum distance of 1.8cm. There is a large amount of purulent drainage noted. The wound margin is flat and intact. There is large (67-100%) red, pink granulation within the wound bed. There is no necrotic tissue within the wound bed. The periwound skin appearance exhibited: Moist. The periwound skin appearance did not exhibit: Callus, Crepitus, Excoriation, Fluctuance, Friable, Induration, Localized Edema, Rash, Scarring, Dry/Scaly, Maceration, Atrophie Blanche, Cyanosis, Ecchymosis, Hemosiderin Staining, Mottled, Pallor, Rubor, Erythema. Periwound temperature was noted as No Abnormality. The periwound has tenderness on palpation. Wound #2 status is Open. Original cause of wound was Pressure Injury. The wound is located on the Midline Sacrum. The wound measures 5.2cm length x 5.6cm width x 0.2cm depth; 22.871cm^2 area and 4.574cm^3 volume. There is bone exposed. There is no tunneling or undermining noted. There is a medium amount of serous drainage  noted. The wound margin is flat and intact. There is small (1-33%) pink Myre, Jahna M. ([id number]) granulation within the wound bed. There is a large (67-100%) amount of necrotic tissue within the wound bed including Adherent Slough. The periwound skin appearance exhibited: Scarring, Moist. The periwound skin appearance did not exhibit: Callus, Crepitus, Excoriation, Fluctuance, Friable, Induration, Localized Edema, Rash, Dry/Scaly, Maceration, Atrophie Blanche, Cyanosis, Ecchymosis, Hemosiderin Staining, Mottled, Pallor, Rubor, Erythema. Periwound temperature was noted as No Abnormality. The periwound has tenderness on palpation. Assessment Active Problems ICD-10 L89.114 - Pressure ulcer of right upper back, stage 4 L89.154 - Pressure ulcer of sacral region, stage 4 G82.52 - Quadriplegia, C1-C4 incomplete E44.0 - Moderate protein-calorie malnutrition We will continue the silver alginate dressing as before and I have urged her to keep an appointment or obtain it  as soon as possible to see the plastic surgeons at [facility]. Off loading, nutrition, vitamins have been discussed with her and she says she is being very diligent about this. Plan Wound Cleansing: Wound #1 Right Scapula: Cleanse wound with mild soap and water May Shower, gently pat wound dry prior to applying new dressing. Wound #2 Midline Sacrum: Cleanse wound with mild soap and water May Shower, gently pat wound dry prior to applying new dressing. Primary Wound Dressing: Wound #1 Right Scapula: Aquacel Ag - rope; lightly pack undermining (11-5) Wound #2 Midline Sacrum: Aquacel Ag - rope Secondary Dressing: Wound #1 Right Scapula: Boardered Foam Dressing Wound #2 Midline Sacrum: [name], [name] ([id number]) Boardered Foam Dressing Dressing Change Frequency: Wound #1 Right Scapula: Change dressing every other day. Wound #2 Midline Sacrum: Change dressing every other day. Follow-up Appointments: Wound #1  Right Scapula: Return Appointment in 1 week. Off-Loading: Wound #1 Right Scapula: Turn and reposition every 2 hours Wound #2 Midline Sacrum: Turn and reposition every 2 hours Home Health: Wound #1 Right Scapula: Continue Home Health Visits - [facility] Home Health Nurse may visit PRN to address patient s wound care needs. FACE TO FACE ENCOUNTER: MEDICARE and MEDICAID PATIENTS: I certify that this patient is under my care and that I had a face-to-face encounter that meets the physician face-to-face encounter requirements with this patient on this date. The encounter with the patient was in whole or in part for the following MEDICAL CONDITION: (primary reason for Home Healthcare) MEDICAL NECESSITY: I certify, that based on my findings, NURSING services are a medically necessary home health service. HOME BOUND STATUS: I certify that my clinical findings support that this patient is homebound (i.e., Due to illness or injury, pt requires aid of supportive devices such as crutches, cane, wheelchairs, walkers, the use of special transportation or the assistance of another person to leave their place of residence. There is a normal inability to leave the home and doing so requires considerable and taxing effort. Other absences are for medical reasons / religious services and are infrequent or of short duration when for other reasons). If current dressing causes regression in wound condition, may D/C ordered dressing product/s and apply Normal Saline Moist Dressing daily until next Wound Healing Center / Other MD appointment. Notify Wound Healing Center of regression in wound condition at [phone]. Please direct any NON-WOUND related issues/requests for orders to patient's Primary Care Physician Wound #2 Midline Sacrum: Continue Home Health Visits - [facility] Home Health Nurse may visit PRN to address patient s wound care needs. FACE TO FACE ENCOUNTER: MEDICARE and  MEDICAID PATIENTS: I certify that this patient is under my care and that I had a face-to-face encounter that meets the physician face-to-face encounter requirements with this patient on this date. The encounter with the patient was in whole or in part for the following MEDICAL CONDITION: (primary reason for Home Healthcare) MEDICAL NECESSITY: I certify, that based on my findings, NURSING services are a medically necessary home health service. HOME BOUND STATUS: I certify that my clinical findings support that this patient is homebound (i.e., Due to illness or injury, pt requires aid of supportive devices such as crutches, cane, wheelchairs, walkers, the use of special transportation or the assistance of another person to leave their place of residence. There is a normal inability to leave the home and doing so requires considerable and taxing effort. Other absences are for medical reasons / religious services and are infrequent or  of short duration when for other reasons). If current dressing causes regression in wound condition, may D/C ordered dressing product/s and apply Normal Saline Moist Dressing daily until next Wound Healing Center / Other MD appointment. Notify Wound Healing Center of regression in wound condition at [phone]. Please direct any NON-WOUND related issues/requests for orders to patient's Primary Care Physician BRINLEIGH, TEW ([id number]) We will continue the silver alginate dressing as before and I have urged her to keep an appointment or obtain it as soon as possible to see the plastic surgeons at [facility]. Off loading, nutrition, vitamins have been discussed with her and she says she is being very diligent about this. Electronic Signature(s) Signed: 03/11/2015 10:39:32 AM By: Evlyn Kanner MD, FACS Entered By: Evlyn Kanner on 03/11/2015 10:39:32 Barron, [name]  ([id number]) -------------------------------------------------------------------------------- SuperBill Details Patient Name: SAUSHA, RAYMOND. Date of Service: 03/11/2015 Medical Record Number: [id number] Patient Account Number: [id number] Date of Birth/Sex: 12/26/1965 (49 y.o. Female) Treating RN: Curtis Sites Primary Care Physician: [place], JULIE Other Clinician: Referring Physician: [place], JULIE Treating Physician/Extender: Rudene Re in Treatment: 6 Diagnosis Coding ICD-10 Codes Code Description L89.114 Pressure ulcer of right upper back, stage 4 L89.154 Pressure ulcer of sacral region, stage 4 G82.52 Quadriplegia, C1-C4 incomplete E44.0 Moderate protein-calorie malnutrition Facility Procedures CPT4 Code: 13244010 Description: 99213 - WOUND CARE VISIT-LEV 3 EST PT Modifier: Quantity: 1 Physician Procedures CPT4 Code: [id number] Description: 99213 - WC PHYS LEVEL 3 - EST PT ICD-10 Description Diagnosis L89.114 Pressure ulcer of right upper back, stage 4 L89.154 Pressure ulcer of sacral region, stage 4 G82.52 Quadriplegia, C1-C4 incomplete Modifier: Quantity: 1 Electronic Signature(s) Signed: 03/11/2015 10:39:47 AM By: Evlyn Kanner MD, FACS Entered By: Evlyn Kanner on 03/11/2015 10:39:46

## 2015-03-12 NOTE — Progress Notes (Signed)
[name] ([id number]) Visit Report for 03/11/2015 Arrival Information Details Patient Name: [name], [name] Date of Service: 03/11/2015 10:00 AM Medical Record Number: [id number] Patient Account Number: [id number] Date of Birth/Sex: 25-Nov-1965 (49 y.o. Female) Treating RN: Curtis Sites Primary Care Physician: [place], JULIE Other Clinician: Referring Physician: [place], JULIE Treating Physician/Extender: Rudene Re in Treatment: 6 Visit Information History Since Last Visit Added or deleted any medications: No Patient Arrived: Wheel Chair Any new allergies or adverse reactions: No Arrival Time: 10:04 Had a fall or experienced change in No activities of daily living that may affect Accompanied By: spouse risk of falls: Transfer Assistance: Manual Signs or symptoms of abuse/neglect since last No Patient Identification Verified: Yes visito Secondary Verification Process Yes Hospitalized since last visit: No Completed: Pain Present Now: No Patient Has Alerts: No Electronic Signature(s) Signed: 03/11/2015 5:13:06 PM By: Curtis Sites Entered By: Curtis Sites on 03/11/2015 10:05:12 Kotecki, [name] ([id number]) -------------------------------------------------------------------------------- Clinic Level of Care Assessment Details Patient Name: [name] Date of Service: 03/11/2015 10:00 AM Medical Record Number: [id number] Patient Account Number: [id number] Date of Birth/Sex: 1966-04-22 (49 y.o. Female) Treating RN: Curtis Sites Primary Care Physician: [place], JULIE Other Clinician: Referring Physician: [place], JULIE Treating Physician/Extender: Rudene Re in Treatment: 6 Clinic Level of Care Assessment Items TOOL 4 Quantity Score []  - Use when only an EandM is performed on FOLLOW-UP visit 0 ASSESSMENTS - Nursing Assessment / Reassessment X - Reassessment of Co-morbidities (includes updates in patient status) 1 10 X - Reassessment of  Adherence to Treatment Plan 1 5 ASSESSMENTS - Wound and Skin Assessment / Reassessment []  - Simple Wound Assessment / Reassessment - one wound 0 X - Complex Wound Assessment / Reassessment - multiple wounds 2 5 []  - Dermatologic / Skin Assessment (not related to wound area) 0 ASSESSMENTS - Focused Assessment []  - Circumferential Edema Measurements - multi extremities 0 []  - Nutritional Assessment / Counseling / Intervention 0 []  - Lower Extremity Assessment (monofilament, tuning fork, pulses) 0 []  - Peripheral Arterial Disease Assessment (using hand held doppler) 0 ASSESSMENTS - Ostomy and/or Continence Assessment and Care []  - Incontinence Assessment and Management 0 []  - Ostomy Care Assessment and Management (repouching, etc.) 0 PROCESS - Coordination of Care X - Simple Patient / Family Education for ongoing care 1 15 []  - Complex (extensive) Patient / Family Education for ongoing care 0 X - Staff obtains Chiropractor, Records, Test Results / Process Orders 1 10 []  - Staff telephones HHA, Nursing Homes / Clarify orders / etc 0 []  - Routine Transfer to another Facility (non-emergent condition) 0 [place], Brisia M. ([id number]) []  - Routine Hospital Admission (non-emergent condition) 0 []  - New Admissions / Manufacturing engineer / Ordering NPWT, Apligraf, etc. 0 []  - Emergency Hospital Admission (emergent condition) 0 X - Simple Discharge Coordination 1 10 []  - Complex (extensive) Discharge Coordination 0 PROCESS - Special Needs []  - Pediatric / Minor Patient Management 0 []  - Isolation Patient Management 0 []  - Hearing / Language / Visual special needs 0 []  - Assessment of Community assistance (transportation, D/C planning, etc.) 0 []  - Additional assistance / Altered mentation 0 []  - Support Surface(s) Assessment (bed, cushion, seat, etc.) 0 INTERVENTIONS - Wound Cleansing / Measurement []  - Simple Wound Cleansing - one wound 0 X - Complex Wound Cleansing - multiple wounds 2 5 X -  Wound Imaging (photographs - any number of wounds) 1 5 []  - Wound Tracing (instead of photographs) 0 []  - Simple Wound Measurement -  one wound 0 X - Complex Wound Measurement - multiple wounds 2 5 INTERVENTIONS - Wound Dressings X - Small Wound Dressing one or multiple wounds 2 10 []  - Medium Wound Dressing one or multiple wounds 0 []  - Large Wound Dressing one or multiple wounds 0 []  - Application of Medications - topical 0 []  - Application of Medications - injection 0 INTERVENTIONS - Miscellaneous []  - External ear exam 0 Ketron, Itzell M. ([id number]) []  - Specimen Collection (cultures, biopsies, blood, body fluids, etc.) 0 []  - Specimen(s) / Culture(s) sent or taken to Lab for analysis 0 []  - Patient Transfer (multiple staff / Michiel Sites Lift / Similar devices) 0 []  - Simple Staple / Suture removal (25 or less) 0 []  - Complex Staple / Suture removal (26 or more) 0 []  - Hypo / Hyperglycemic Management (close monitor of Blood Glucose) 0 []  - Ankle / Brachial Index (ABI) - do not check if billed separately 0 X - Vital Signs 1 5 Has the patient been seen at the hospital within the last three years: Yes Total Score: 110 Level Of Care: New/Established - Level 3 Electronic Signature(s) Signed: 03/11/2015 5:13:06 PM By: Curtis Sites Entered By: Curtis Sites on 03/11/2015 10:22:28 Clewis, [name] ([id number]) -------------------------------------------------------------------------------- Encounter Discharge Information Details Patient Name: [name] Date of Service: 03/11/2015 10:00 AM Medical Record Number: [id number] Patient Account Number: [id number] Date of Birth/Sex: Sep 11, 1965 (49 y.o. Female) Treating RN: Curtis Sites Primary Care Physician: [place], JULIE Other Clinician: Referring Physician: [place], JULIE Treating Physician/Extender: Rudene Re in Treatment: 6 Encounter Discharge Information Items Discharge Pain Level: 0 Discharge Condition:  Stable Ambulatory Status: Wheelchair Discharge Destination: Home Transportation: Private Auto Accompanied By: spouse Schedule Follow-up Appointment: Yes Medication Reconciliation completed No and provided to Patient/Care Aruna Nestler: Provided on Clinical Summary of Care: 03/11/2015 Form Type Recipient Paper Patient CW Electronic Signature(s) Signed: 03/11/2015 10:33:44 AM By: Gwenlyn Perking Entered By: Gwenlyn Perking on 03/11/2015 10:33:44 Priego, [name] ([id number]) -------------------------------------------------------------------------------- Multi Wound Chart Details Patient Name: [name] Date of Service: 03/11/2015 10:00 AM Medical Record Number: [id number] Patient Account Number: [id number] Date of Birth/Sex: Jun 10, 1966 (49 y.o. Female) Treating RN: Curtis Sites Primary Care Physician: [place], JULIE Other Clinician: Referring Physician: [place], JULIE Treating Physician/Extender: Rudene Re in Treatment: 6 Vital Signs Height(in): 63 Pulse(bpm): 81 Weight(lbs): 100 Blood Pressure 104/72 (mmHg): Body Mass Index(BMI): 18 Temperature(F): 98.5 Respiratory Rate 18 (breaths/min): Photos: [1:No Photos] [2:No Photos] [N/A:N/A] Wound Location: [1:Right Scapula] [2:Sacrum - Midline] [N/A:N/A] Wounding Event: [1:Pressure Injury] [2:Pressure Injury] [N/A:N/A] Primary Etiology: [1:Pressure Ulcer] [2:Pressure Ulcer] [N/A:N/A] Comorbid History: [1:Asthma, Chronic Obstructive Pulmonary Disease (COPD), Paraplegia] [2:Asthma, Chronic Obstructive Pulmonary Disease (COPD), Paraplegia] [N/A:N/A] Date Acquired: [1:09/21/2014] [2:12/02/2007] [N/A:N/A] Weeks of Treatment: [1:6] [2:6] [N/A:N/A] Wound Status: [1:Open] [2:Open] [N/A:N/A] Measurements L x W x D 0.9x0.6x0.4 [2:5.2x5.6x0.2] [N/A:N/A] (cm) Area (cm) : [1:0.424] [2:22.871] [N/A:N/A] Volume (cm) : [1:0.17] [2:4.574] [N/A:N/A] % Reduction in Area: [1:65.40%] [2:-2933.30%] [N/A:N/A] % Reduction in Volume:  82.70% [2:-5998.70%] [N/A:N/A] Starting Position 1 9 (o'clock): Ending Position 1 [1:6] (o'clock): Maximum Distance 1 1.8 (cm): Undermining: [1:Yes] [2:No] [N/A:N/A] 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] [N/A:N/A] Exudate Amount: [1:Large] [2:Medium] [N/A:N/A] Exudate Type: [1:Purulent] [2:Serous] [N/A:N/A] Exudate Color: [1:yellow, brown, green] [2:amber] [N/A:N/A] Foul Odor After [1:No] [2:Yes] [N/A:N/A] Cleansing: Hymon, Luane M. ([id number]) Odor Anticipated Due to N/A No N/A Product Use: Wound Margin: Flat and Intact Flat and Intact N/A Granulation Amount: Large (67-100%) Small (1-33%) N/A Granulation Quality: Red, Pink Pink N/A Necrotic Amount: None Present (0%)  Large (67-100%) N/A Exposed Structures: Bone: Yes Bone: Yes N/A Fascia: No Fascia: No Fat: No Fat: No Tendon: No Tendon: No Muscle: No Muscle: No Joint: No Joint: No Epithelialization: None None N/A Periwound Skin Texture: Edema: No Scarring: Yes N/A Excoriation: No Edema: No Induration: No Excoriation: No Callus: No Induration: No Crepitus: No Callus: No Fluctuance: No Crepitus: No Friable: No Fluctuance: No Rash: No Friable: No Scarring: No Rash: No Periwound Skin Moist: Yes Moist: Yes N/A Moisture: Maceration: No Maceration: No Dry/Scaly: No Dry/Scaly: No Periwound Skin Color: Atrophie Blanche: No Atrophie Blanche: No N/A Cyanosis: No Cyanosis: No Ecchymosis: No Ecchymosis: No Erythema: No Erythema: No Hemosiderin Staining: No Hemosiderin Staining: No Mottled: No Mottled: No Pallor: No Pallor: No Rubor: No Rubor: No Temperature: No Abnormality No Abnormality N/A Tenderness on Yes Yes N/A Palpation: Wound Preparation: Ulcer Cleansing: Ulcer Cleansing: N/A Rinsed/Irrigated with Rinsed/Irrigated with Saline Saline Topical Anesthetic Topical Anesthetic Applied: Other: lidocaine Applied: Other: lidocaine 4% 4% Treatment Notes Electronic  Signature(s) Signed: 03/11/2015 5:13:06 PM By: Curtis Sites Entered By: Curtis Sites on 03/11/2015 10:16:36 Brackens, ERCILIA BETTINGER ([id number]) Nalepa, [name] ([id number]) -------------------------------------------------------------------------------- Multi-Disciplinary Care Plan Details Patient Name: TENELLE, ANDREASON. Date of Service: 03/11/2015 10:00 AM Medical Record Number: [id number] Patient Account Number: [id number] Date of Birth/Sex: [birth year] (49 y.o. Female) Treating RN: Curtis Sites Primary Care Physician: [place], JULIE Other Clinician: Referring Physician: [place], JULIE Treating Physician/Extender: Rudene Re in Treatment: 6 Active Inactive Abuse / Safety / Falls / Self Care Management Nursing Diagnoses: Impaired home maintenance Impaired physical mobility Potential for falls Self care deficit: actual or potential Goals: Patient/caregiver will verbalize understanding of skin care regimen Date Initiated: 01/27/2015 Goal Status: Active Patient/caregiver will verbalize/demonstrate measure taken to improve self care Date Initiated: 01/27/2015 Goal Status: Active Patient/caregiver will verbalize/demonstrate measures taken to improve the patient's personal safety Date Initiated: 01/27/2015 Goal Status: Active Patient/caregiver will verbalize/demonstrate measures taken to prevent injury and/or falls Date Initiated: 01/27/2015 Goal Status: Active Patient/caregiver will verbalize/demonstrate understanding of what to do in case of emergency Date Initiated: 01/27/2015 Goal Status: Active Interventions: Assess fall risk on admission and as needed Assess: immobility, friction, shearing, incontinence upon admission and as needed Assess impairment of mobility on admission and as needed per policy Assess self care needs on admission and as needed Provide education on basic hygiene Provide education on fall prevention Provide education on personal and home  safety Provide education on safe transfers ABBY, TUCHOLSKI ([id number]) Treatment Activities: Education provided on Basic Hygiene : 02/04/2015 Notes: Orientation to the Wound Care Program Nursing Diagnoses: Knowledge deficit related to the wound healing center program Goals: Patient/caregiver will verbalize understanding of the Wound Healing Center Program Date Initiated: 01/27/2015 Goal Status: Active Interventions: Provide education on orientation to the wound center Notes: Pressure Nursing Diagnoses: Knowledge deficit related to causes and risk factors for pressure ulcer development Knowledge deficit related to management of pressures ulcers Potential for impaired tissue integrity related to pressure, friction, moisture, and shear Goals: Patient will remain free from development of additional pressure ulcers Date Initiated: 01/27/2015 Goal Status: Active Patient will remain free of pressure ulcers Date Initiated: 01/27/2015 Goal Status: Active Patient/caregiver will verbalize risk factors for pressure ulcer development Date Initiated: 01/27/2015 Goal Status: Active Patient/caregiver will verbalize understanding of pressure ulcer management Date Initiated: 01/27/2015 Goal Status: Active Interventions: Assess: immobility, friction, shearing, incontinence upon admission and as needed Assess offloading mechanisms upon admission and as needed Assess potential for pressure ulcer upon admission and as needed  Provide education on pressure ulcers KAIDANCE, PANTOJA. ([id number]) Treatment Activities: Patient referred for pressure reduction/relief devices : 03/11/2015 Patient referred for seating evaluation to ensure proper offloading : 03/11/2015 Notes: Wound/Skin Impairment Nursing Diagnoses: Impaired tissue integrity Knowledge deficit related to smoking impact on wound healing Knowledge deficit related to ulceration/compromised skin integrity Goals: Patient/caregiver will verbalize  understanding of skin care regimen Date Initiated: 01/27/2015 Goal Status: Active Ulcer/skin breakdown will have a volume reduction of 30% by week 4 Date Initiated: 01/27/2015 Goal Status: Active Ulcer/skin breakdown will have a volume reduction of 50% by week 8 Date Initiated: 01/27/2015 Goal Status: Active Ulcer/skin breakdown will have a volume reduction of 80% by week 12 Date Initiated: 01/27/2015 Goal Status: Active Ulcer/skin breakdown will heal within 14 weeks Date Initiated: 01/27/2015 Goal Status: Active Interventions: Assess ulceration(s) every visit Provide education on ulcer and skin care Notes: Electronic Signature(s) Signed: 03/11/2015 5:13:06 PM By: Curtis Sites Entered By: Curtis Sites on 03/11/2015 10:16:22 Chuong, [name] ([id number]) -------------------------------------------------------------------------------- Patient/Caregiver Education Details Patient Name: [name] Date of Service: 03/11/2015 10:00 AM Medical Record Number: [id number] Patient Account Number: [id number] Date of Birth/Gender: Jan 13, 1966 (49 y.o. Female) Treating RN: Curtis Sites Primary Care Physician: [place], JULIE Other Clinician: Referring Physician: [place], JULIE Treating Physician/Extender: Rudene Re in Treatment: 6 Education Assessment Education Provided To: Patient and Caregiver Education Topics Provided Nutrition: Handouts: Other: vitamins for wound healing Methods: Explain/Verbal Responses: State content correctly Electronic Signature(s) Signed: 03/11/2015 5:13:06 PM By: Curtis Sites Entered By: Curtis Sites on 03/11/2015 10:20:55 Plaskett, Arien M. ([id number]) -------------------------------------------------------------------------------- Wound Assessment Details Patient Name: [name] Date of Service: 03/11/2015 10:00 AM Medical Record Number: [id number] Patient Account Number: [id number] Date of Birth/Sex: 01-27-1966 (49 y.o.  Female) Treating RN: Curtis Sites Primary Care Physician: [place], JULIE Other Clinician: Referring Physician: [place], JULIE Treating Physician/Extender: Rudene Re in Treatment: 6 Wound Status Wound Number: 1 Primary Pressure Ulcer Etiology: Wound Location: Right Scapula Wound Open Wounding Event: Pressure Injury Status: Date Acquired: 09/21/2014 Comorbid Asthma, Chronic Obstructive Weeks Of Treatment: 6 History: Pulmonary Disease (COPD), Clustered Wound: No Paraplegia Photos Photo Uploaded By: Curtis Sites on 03/11/2015 11:14:36 Wound Measurements Length: (cm) 0.9 Width: (cm) 0.6 Depth: (cm) 0.4 Area: (cm) 0.424 Volume: (cm) 0.17 % Reduction in Area: 65.4% % Reduction in Volume: 82.7% Epithelialization: None Undermining: Yes Starting Position (o'clock): 11 Ending Position (o'clock): 5 Maximum Distance: (cm) 1.8 Wound Description Classification: Category/Stage IV Wound Margin: Flat and Intact Exudate Amount: Large Exudate Type: Purulent Exudate Color: yellow, brown, green Foul Odor After Cleansing: No Wound Bed Granulation Amount: Large (67-100%) Exposed Structure Flessner, Rickia M. ([id number]) Granulation Quality: Red, Pink Fascia Exposed: No Necrotic Amount: None Present (0%) Fat Layer Exposed: No Tendon Exposed: No Muscle Exposed: No Joint Exposed: No Bone Exposed: Yes Periwound Skin Texture Texture Color No Abnormalities Noted: No No Abnormalities Noted: No Callus: No Atrophie Blanche: No Crepitus: No Cyanosis: No Excoriation: No Ecchymosis: No Fluctuance: No Erythema: No Friable: No Hemosiderin Staining: No Induration: No Mottled: No Localized Edema: No Pallor: No Rash: No Rubor: No Scarring: No Temperature / Pain Moisture Temperature: No Abnormality No Abnormalities Noted: No Tenderness on Palpation: Yes Dry / Scaly: No Maceration: No Moist: Yes Wound Preparation Ulcer Cleansing: Rinsed/Irrigated with  Saline Topical Anesthetic Applied: Other: lidocaine 4%, Treatment Notes Wound #1 (Right Scapula) 1. Cleansed with: Clean wound with Normal Saline 2. Anesthetic Topical Lidocaine 4% cream to wound bed prior to debridement 4. Dressing Applied: Aquacel Ag 5. Secondary Dressing Applied  Bordered Foam Dressing Electronic Signature(s) Signed: 03/11/2015 5:13:06 PM By: Curtis Sites Entered By: Curtis Sites on 03/11/2015 10:19:42 Trinidad, [name] ([id number]) -------------------------------------------------------------------------------- Wound Assessment Details Patient Name: RASHMI, TALLENT. Date of Service: 03/11/2015 10:00 AM Medical Record Number: [id number] Patient Account Number: [id number] Date of Birth/Sex: 1965/07/18 (49 y.o. Female) Treating RN: Curtis Sites Primary Care Physician: [place], JULIE Other Clinician: Referring Physician: [place], JULIE Treating Physician/Extender: Rudene Re in Treatment: 6 Wound Status Wound Number: 2 Primary Pressure Ulcer Etiology: Wound Location: Sacrum - Midline Wound Open Wounding Event: Pressure Injury Status: Date Acquired: 12/02/2007 Comorbid Asthma, Chronic Obstructive Weeks Of Treatment: 6 History: Pulmonary Disease (COPD), Clustered Wound: No Paraplegia Photos Photo Uploaded By: Curtis Sites on 03/11/2015 11:14:37 Wound Measurements Length: (cm) 5.2 Width: (cm) 5.6 Depth: (cm) 0.2 Area: (cm) 22.871 Volume: (cm) 4.574 % Reduction in Area: -2933.3% % Reduction in Volume: -5998.7% Epithelialization: None Tunneling: No Undermining: No Wound Description Classification: Category/Stage IV Wound Margin: Flat and Intact Exudate Amount: Medium Exudate Type: Serous Exudate Color: amber Foul Odor After Cleansing: Yes Due to Product Use: No Wound Bed Granulation Amount: Small (1-33%) Exposed Structure Granulation Quality: Pink Fascia Exposed: No Necrotic Amount: Large (67-100%) Fat Layer Exposed:  No Necrotic Quality: Adherent Slough Tendon Exposed: No Flaugher, Donnielle M. ([id number]) Muscle Exposed: No Joint Exposed: No Bone Exposed: Yes Periwound Skin Texture Texture Color No Abnormalities Noted: No No Abnormalities Noted: No Callus: No Atrophie Blanche: No Crepitus: No Cyanosis: No Excoriation: No Ecchymosis: No Fluctuance: No Erythema: No Friable: No Hemosiderin Staining: No Induration: No Mottled: No Localized Edema: No Pallor: No Rash: No Rubor: No Scarring: Yes Temperature / Pain Moisture Temperature: No Abnormality No Abnormalities Noted: No Tenderness on Palpation: Yes Dry / Scaly: No Maceration: No Moist: Yes Wound Preparation Ulcer Cleansing: Rinsed/Irrigated with Saline Topical Anesthetic Applied: Other: lidocaine 4%, Treatment Notes Wound #2 (Midline Sacrum) 1. Cleansed with: Clean wound with Normal Saline 2. Anesthetic Topical Lidocaine 4% cream to wound bed prior to debridement 4. Dressing Applied: Aquacel Ag 5. Secondary Dressing Applied Bordered Foam Dressing Electronic Signature(s) Signed: 03/11/2015 5:13:06 PM By: Curtis Sites Entered By: Curtis Sites on 03/11/2015 10:16:14 Windmiller, [name] ([id number]) -------------------------------------------------------------------------------- Vitals Details Patient Name: MADDY, GRAHAM. Date of Service: 03/11/2015 10:00 AM Medical Record Number: [id number] Patient Account Number: [id number] Date of Birth/Sex: 11/11/1965 (49 y.o. Female) Treating RN: Curtis Sites Primary Care Physician: [place], JULIE Other Clinician: Referring Physician: [place], JULIE Treating Physician/Extender: Rudene Re in Treatment: 6 Vital Signs Time Taken: 10:05 Temperature (F): 98.5 Height (in): 63 Pulse (bpm): 81 Weight (lbs): 100 Respiratory Rate (breaths/min): 18 Body Mass Index (BMI): 17.7 Blood Pressure (mmHg): 104/72 Reference Range: 80 - 120 mg / dl Electronic Signature(s) Signed:  03/11/2015 5:13:06 PM By: Curtis Sites Entered By: Curtis Sites on 03/11/2015 10:05:47

## 2015-03-18 ENCOUNTER — Encounter: Payer: Medicare Other | Admitting: Surgery

## 2015-03-18 DIAGNOSIS — L89114 Pressure ulcer of right upper back, stage 4: Secondary | ICD-10-CM | POA: Diagnosis not present

## 2015-03-19 NOTE — Progress Notes (Signed)
[name], [name] ([id number]) Visit Report for 03/18/2015 Chief Complaint Document Details Patient Name: [name], [name] 03/18/2015 10:00 Date of Service: AM Medical Record [id number] Number: Patient Account Number: [id number] 08-27-1965 (49 y.o. Treating RN: Curtis Sites Date of Birth/Sex: Female) Other Clinician: Primary Care Physician: [place], JULIE Treating Evlyn Kanner Referring Physician: [place], JULIE Physician/Extender: Weeks in Treatment: 7 Information Obtained from: Patient Chief Complaint Patient presents to the wound care center for a consult due non healing wound. 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 She comes in for a pressure injury to the right scapular region and the sacral region. She has this for several months. Electronic Signature(s) Signed: 03/18/2015 10:57:04 AM By: Evlyn Kanner MD, FACS Entered By: Evlyn Kanner on 03/18/2015 10:57:04 [name] ([id number]) -------------------------------------------------------------------------------- HPI Details Patient Name: [name], [name] 03/18/2015 10:00 Date of Service: AM Medical Record [id number] Number: Patient Account Number: [id number] 04-26-1966 (49 y.o. 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 For about 4-5 months she's had a decubitus ulcer on the right scapular region and she has a couple of small areas which have opened out in the region of previous scar tissue in the sacral region. He does not have any other significant comorbidities. She has a good Roho  cushion for her wheelchair and she has a specialized mattress for sleep. She has stopped smoking a year ago. 02/11/2015 -- she has not yet got her appointment with the plastic surgeons at [facility]. They did a x-ray of the sacral region this afternoon just before coming here. 02/18/2015 -- X-ray of the pelvis -- IMPRESSION:No acute osseous injury of the pelvis. If there is clinical concern regarding osteomyelitis, recommend further evaluation with CT. X-ray of the sacrum and coccyx IMPRESSION:No acute osseous abnormality of the sacrum. If there is further clinical concern regarding osteomyelitis of the sacrum, further evaluation with CT is recommended. 03/11/2015 -- she has yet to obtain the appointment with the plastic surgeons at [facility]. She says she will be diligent and follow-up on this. 03/18/2015 -- she has an appointment to see the plastic surgeons at [facility] this coming Monday. 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 Electronic Signature(s) Signed: 03/18/2015 10:57:33 AM By: Evlyn Kanner MD, FACS Entered By: Evlyn Kanner on 03/18/2015 10:57:33 Hove, [name] ([id number]) -------------------------------------------------------------------------------- Physical Exam Details Patient Name: [name], [name] 03/18/2015 10:00 Date of Service: AM Medical Record [id number] Number: Patient Account Number: [id number] 1965-10-06 (49 y.o. Treating RN: Curtis Sites Date of Birth/Sex: Female) Other Clinician: Primary Care Physician: [place], JULIE Treating Evlyn Kanner Referring Physician: [place], JULIE Physician/Extender: Weeks in Treatment: 7 Constitutional . Pulse regular. Respirations normal and unlabored. Afebrile. . Eyes Nonicteric. Reactive to light. Ears, Nose, Mouth, and Throat Lips, teeth, and gums WNL.Marland Kitchen Moist mucosa without lesions . Neck supple and nontender. No palpable supraclavicular or cervical adenopathy. Normal  sized without goiter. Respiratory WNL. No retractions.. Cardiovascular Pedal Pulses WNL. No clubbing, cyanosis or edema. Lymphatic No adneopathy. No adenopathy. No adenopathy. Musculoskeletal Adexa without tenderness or enlargement.. Digits and nails w/o clubbing, cyanosis, infection, petechiae, ischemia, or inflammatory conditions.. Integumentary (Hair, Skin) No suspicious  lesions. No crepitus or fluctuance. No peri-wound warmth or erythema. No masses.Marland Kitchen Psychiatric Judgement and insight Intact.. No evidence of depression, anxiety, or agitation.. Notes the sacral wound is looking a little better and the area where there was bone protruding is now granulated. As far as her shoulder wound goes she has some slough which was sharply wiped out with a moist saline gauze. Electronic Signature(s) Signed: 03/18/2015 10:58:07 AM By: Evlyn Kanner MD, FACS Entered By: Evlyn Kanner on 03/18/2015 10:58:06 [name] ([id number]) -------------------------------------------------------------------------------- Physician Orders Details Patient Name: [name], [name] 03/18/2015 10:00 Date of Service: AM Medical Record [id number] Number: Patient Account Number: [id number] 11-17-1965 (49 y.o. Treating RN: Curtis Sites Date of Birth/Sex: Female) Other Clinician: Primary Care Physician: [place], JULIE Treating Evlyn Kanner Referring Physician: [place], JULIE Physician/Extender: Weeks in Treatment: 7 Verbal / Phone Orders: Yes Clinician: Curtis Sites Read Back and Verified: Yes Diagnosis Coding Wound Cleansing Wound #1 Right Scapula o Cleanse wound with mild soap and water o May Shower, gently pat wound dry prior to applying new dressing. Wound #2 Midline Sacrum o Cleanse wound with mild soap and water o May Shower, gently pat wound dry prior to applying new dressing. Primary Wound Dressing Wound #1 Right Scapula o Aquacel Ag - rope; lightly pack undermining  (12-5) Wound #2 Midline Sacrum o Aquacel Ag - rope Secondary Dressing Wound #1 Right Scapula o Boardered Foam Dressing Wound #2 Midline Sacrum o Boardered Foam Dressing Dressing Change Frequency Wound #1 Right Scapula o Change dressing every other day. Wound #2 Midline Sacrum o Change dressing every other day. Follow-up Appointments Wound #1 Right Scapula o Return Appointment in 1 week. RIANE, RUNG ([id number]) Off-Loading Wound #1 Right Scapula o Turn and reposition every 2 hours Wound #2 Midline Sacrum o Turn and reposition every 2 hours Home Health Wound #1 Right Scapula o Continue Home Health Visits - [facility] o Home Health Nurse may visit PRN to address patientos wound care needs. o FACE TO FACE ENCOUNTER: MEDICARE and MEDICAID PATIENTS: I certify that this patient is under my care and that I had a face-to-face encounter that meets the physician face-to-face encounter requirements with this patient on this date. The encounter with the patient was in whole or in part for the following MEDICAL CONDITION: (primary reason for Home Healthcare) MEDICAL NECESSITY: I certify, that based on my findings, NURSING services are a medically necessary home health service. HOME BOUND STATUS: I certify that my clinical findings support that this patient is homebound (i.e., Due to illness or injury, pt requires aid of supportive devices such as crutches, cane, wheelchairs, walkers, the use of special transportation or the assistance of another person to leave their place of residence. There is a normal inability to leave the home and doing so requires considerable and taxing effort. Other absences are for medical reasons / religious services and are infrequent or of short duration when for other reasons). o If current dressing causes regression in wound condition, may D/C ordered dressing product/s and apply Normal Saline Moist Dressing daily until  next Wound Healing Center / Other MD appointment. Notify Wound Healing Center of regression in wound condition at [phone]. o Please direct any NON-WOUND related issues/requests for orders to patient's Primary Care Physician Wound #2 Midline Sacrum o Continue Home Health Visits - [facility] o Home Health Nurse may visit PRN to address patientos wound care needs. o FACE TO FACE ENCOUNTER: MEDICARE and MEDICAID PATIENTS: I certify that this patient is  under my care and that I had a face-to-face encounter that meets the physician face-to-face encounter requirements with this patient on this date. The encounter with the patient was in whole or in part for the following MEDICAL CONDITION: (primary reason for Home Healthcare) MEDICAL NECESSITY: I certify, that based on my findings, NURSING services are a medically necessary home health service. HOME BOUND STATUS: I certify that my clinical findings support that this patient is homebound (i.e., Due to illness or injury, pt requires aid of supportive devices such as crutches, cane, wheelchairs, walkers, the use of special transportation or the assistance of another person to leave their place of residence. There is a normal inability to leave the home and doing so requires considerable and taxing effort. Other absences are for medical reasons / religious services and are infrequent or of short duration when for other reasons). o If current dressing causes regression in wound condition, may D/C ordered dressing product/s and apply Normal Saline Moist Dressing daily until next Wound Healing Center / Other MD appointment. Notify Wound Healing Center of regression in wound condition at [phone]. o Please direct any NON-WOUND related issues/requests for orders to patient's Primary Care Physician DRINDA, BELGARD ([id number]) Electronic Signature(s) Signed: 03/18/2015 4:21:04 PM By: Evlyn Kanner MD, FACS Signed: 03/18/2015  6:07:15 PM By: Curtis Sites Entered By: Curtis Sites on 03/18/2015 10:09:36 Viner, [name] ([id number]) -------------------------------------------------------------------------------- Problem List Details Patient Name: TAZIAH, DIFATTA 03/18/2015 10:00 Date of Service: AM Medical Record [id number] Number: Patient Account Number: [id number] 01-26-1966 (49 y.o. 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 03/18/2015 10:00 Date of Service: AM Medical Record [id number] Number: Patient Account Number: [id number] May 07, 1966 (49 y.o. Treating RN: Curtis Sites Date of Birth/Sex: Female) Other Clinician: Primary Care Physician: [place], JULIE Treating Evlyn Kanner Referring Physician: [place], JULIE Physician/Extender: Weeks in Treatment: 7 Subjective Chief Complaint Information obtained from Patient Patient presents to the wound care center for a consult due non healing wound. 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 She comes in for a pressure injury to the right scapular region and the sacral region. She has  this for several months. 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 For about 4-5 months she's had a decubitus ulcer on the right scapular region and she has a couple of small areas which have opened out in the region of previous scar tissue in the sacral region. He does not have any other significant comorbidities. She has a good Roho cushion for her wheelchair and she has a specialized mattress for sleep. She has stopped smoking a year ago. 02/11/2015 -- she has not yet got her appointment with the plastic surgeons at [facility]. They did a x-ray of the sacral region this afternoon just before coming here.  02/18/2015 -- X-ray of the pelvis -- IMPRESSION:No acute osseous injury of the pelvis. If there is clinical concern regarding osteomyelitis, recommend further evaluation with CT. X-ray of the sacrum and coccyx IMPRESSION:No acute osseous abnormality of the sacrum. If there is further clinical concern regarding osteomyelitis of the sacrum, further evaluation with CT is recommended. 03/11/2015 -- she has yet to obtain the appointment with the plastic surgeons at [facility]. She says she will be diligent and follow-up on this. 03/18/2015 -- she has an appointment to see the plastic surgeons at [facility] this coming Monday. 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 Objective Constitutional Golliday, Debraann M. ([id number]) Pulse regular. Respirations normal and unlabored. Afebrile. Vitals Time Taken: 9:36 AM, Height: 63 in, Weight: 100 lbs, BMI: 17.7, Temperature: 97.6 F, Pulse: 84 bpm, Respiratory Rate: 18 breaths/min. Eyes Nonicteric. Reactive to light. Ears, Nose, Mouth, and Throat Lips, teeth, and gums WNL.Marland Kitchen Moist mucosa without lesions . Neck supple and nontender. No palpable supraclavicular or  cervical adenopathy. Normal sized without goiter. Respiratory WNL. No retractions.. Cardiovascular Pedal Pulses WNL. No clubbing, cyanosis or edema. Lymphatic No adneopathy. No adenopathy. No adenopathy. Musculoskeletal Adexa without tenderness or enlargement.. Digits and nails w/o clubbing, cyanosis, infection, petechiae, ischemia, or inflammatory conditions.Marland Kitchen Psychiatric Judgement and insight Intact.. No evidence of depression, anxiety, or agitation.. General Notes: the sacral wound is looking a little better and the area where there was bone protruding is now granulated. As far as her shoulder wound goes she has some slough which was sharply wiped out with a moist saline gauze. Integumentary (Hair, Skin) No suspicious lesions. No crepitus or fluctuance. No peri-wound warmth or erythema. No masses.. Wound #1 status is Open. Original cause of wound was Pressure Injury. The wound is located on the Right Scapula. The wound measures 1.3cm length x 0.9cm width x 0.3cm depth; 0.919cm^2 area and 0.276cm^3 volume. There is bone exposed. There is no tunneling noted, however, there is undermining starting at 12:00 and ending at 5:00 with a maximum distance of 1.9cm. There is a large amount of purulent drainage noted. The wound margin is flat and intact. There is large (67-100%) red, pink granulation within the wound bed. There is no necrotic tissue within the wound bed. The periwound skin appearance exhibited: Moist. The periwound skin appearance did not exhibit: Callus, Crepitus, Excoriation, Fluctuance, Friable, Induration, Localized Edema, Rash, Scarring, Dry/Scaly, Maceration, Atrophie Blanche, Cyanosis, Ecchymosis, Hemosiderin Staining, Mottled, Pallor, Rubor, Erythema. Periwound temperature was noted as No Abnormality. The periwound has tenderness on palpation. Wound #2 status is Open. Original cause of wound was Pressure Injury. The wound is located on the [place].  ([id number]) Midline Sacrum. The wound measures 5cm length x 4.9cm width x 0.3cm depth; 19.242cm^2 area and 5.773cm^3 volume. There is bone exposed. There is no tunneling or undermining noted. There is a medium amount of serous drainage noted. The wound margin is flat and intact. There is small (1-33%) pink granulation within the wound bed. There is a large (67-100%) amount of necrotic tissue within the wound bed including Adherent Slough. The periwound skin appearance exhibited: Scarring, Moist. The periwound skin appearance did not exhibit: Callus, Crepitus, Excoriation, Fluctuance, Friable, Induration, Localized Edema, Rash, Dry/Scaly, Maceration, Atrophie Blanche, Cyanosis, Ecchymosis, Hemosiderin Staining, Mottled, Pallor, Rubor, Erythema. Periwound temperature was noted as No Abnormality. The periwound has tenderness on palpation. Assessment Active Problems ICD-10 L89.114 - Pressure ulcer of right upper back, stage 4 L89.154 - Pressure ulcer  of sacral region, stage 4 G82.52 - Quadriplegia, C1-C4 incomplete E44.0 - Moderate protein-calorie malnutrition I have recommended we continue packing the shoulder wound with silver alginate rope and appropriate applying silver alginate on the sacral wound and off loading this as much as possible. She will keep an appointment with the plastic surgeons and see as back as needed. I have urged her to get a note sent to assess on her she has been seen by them. Plan Wound Cleansing: Wound #1 Right Scapula: Cleanse wound with mild soap and water May Shower, gently pat wound dry prior to applying new dressing. Wound #2 Midline Sacrum: Cleanse wound with mild soap and water May Shower, gently pat wound dry prior to applying new dressing. Primary Wound Dressing: Wound #1 Right Scapula: Aquacel Ag - rope; lightly pack undermining (12-5) Wound #2 Midline Sacrum: Aquacel Ag - rope Secondary Dressing: Wound #1 Right Scapula: Aten, Luisa M.  ([id number]) Boardered Foam Dressing Wound #2 Midline Sacrum: Boardered Foam Dressing Dressing Change Frequency: Wound #1 Right Scapula: Change dressing every other day. Wound #2 Midline Sacrum: Change dressing every other day. Follow-up Appointments: Wound #1 Right Scapula: Return Appointment in 1 week. Off-Loading: Wound #1 Right Scapula: Turn and reposition every 2 hours Wound #2 Midline Sacrum: Turn and reposition every 2 hours Home Health: Wound #1 Right Scapula: Continue Home Health Visits - [facility] Home Health Nurse may visit PRN to address patient s wound care needs. FACE TO FACE ENCOUNTER: MEDICARE and MEDICAID PATIENTS: I certify that this patient is under my care and that I had a face-to-face encounter that meets the physician face-to-face encounter requirements with this patient on this date. The encounter with the patient was in whole or in part for the following MEDICAL CONDITION: (primary reason for Home Healthcare) MEDICAL NECESSITY: I certify, that based on my findings, NURSING services are a medically necessary home health service. HOME BOUND STATUS: I certify that my clinical findings support that this patient is homebound (i.e., Due to illness or injury, pt requires aid of supportive devices such as crutches, cane, wheelchairs, walkers, the use of special transportation or the assistance of another person to leave their place of residence. There is a normal inability to leave the home and doing so requires considerable and taxing effort. Other absences are for medical reasons / religious services and are infrequent or of short duration when for other reasons). If current dressing causes regression in wound condition, may D/C ordered dressing product/s and apply Normal Saline Moist Dressing daily until next Wound Healing Center / Other MD appointment. Notify Wound Healing Center of regression in wound condition at [phone]. Please direct any  NON-WOUND related issues/requests for orders to patient's Primary Care Physician Wound #2 Midline Sacrum: Continue Home Health Visits - [facility] Home Health Nurse may visit PRN to address patient s wound care needs. FACE TO FACE ENCOUNTER: MEDICARE and MEDICAID PATIENTS: I certify that this patient is under my care and that I had a face-to-face encounter that meets the physician face-to-face encounter requirements with this patient on this date. The encounter with the patient was in whole or in part for the following MEDICAL CONDITION: (primary reason for Home Healthcare) MEDICAL NECESSITY: I certify, that based on my findings, NURSING services are a medically necessary home health service. HOME BOUND STATUS: I certify that my clinical findings support that this patient is homebound (i.e., Due to illness or injury, pt requires aid of supportive devices such as crutches, cane, wheelchairs,  walkers, the use of special transportation or the assistance of another person to leave their place of residence. There is a normal inability to leave the home and doing so requires considerable and taxing effort. Other absences are for medical reasons / religious services and are infrequent or of short duration when for other reasons). If current dressing causes regression in wound condition, may D/C ordered dressing product/s and apply Normal Saline Moist Dressing daily until next Wound Healing Center / Other MD appointment. Notify Wound Healing Center of regression in wound condition at [phone]. Please direct any NON-WOUND related issues/requests for orders to patient's Primary Care Physician ELVENA, OYER. ([id number]) I have recommended we continue packing the shoulder wound with silver alginate rope and appropriate applying silver alginate on the sacral wound and off loading this as much as possible. She will keep an appointment with the plastic surgeons and see as back as needed. I  have urged her to get a note sent to assess on her she has been seen by them. Electronic Signature(s) Signed: 03/18/2015 10:58:51 AM By: Evlyn Kanner MD, FACS Entered By: Evlyn Kanner on 03/18/2015 10:58:51 Ardoin, [name] ([id number]) -------------------------------------------------------------------------------- SuperBill Details Patient Name: NATALIA, WITTMEYER. Date of Service: 03/18/2015 Medical Record Number: [id number] Patient Account Number: [id number] Date of Birth/Sex: December 09, 1965 (49 y.o. 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, stage 4 L89.154 Pressure ulcer of sacral region, stage 4 G82.52 Quadriplegia, C1-C4 incomplete E44.0 Moderate protein-calorie malnutrition Facility Procedures CPT4 Code: [id number] Description: 99213 - WOUND CARE VISIT-LEV 3 EST PT Modifier: Quantity: 1 Physician Procedures CPT4 Code: 2440102 Description: 99213 - WC PHYS LEVEL 3 - EST PT ICD-10 Description Diagnosis 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

## 2015-03-19 NOTE — Progress Notes (Signed)
ZENOVIA, JUSTMAN ([id number]) Visit Report for 03/18/2015 Arrival Information Details Patient Name: HLEE, FRINGER. Date of Service: 03/18/2015 10:00 AM Medical Record Number: [id number] Patient Account Number: [id number] Date of Birth/Sex: 08/29/1965 (49 y.o. Female) Treating RN: Curtis Sites Primary Care Physician: [place], JULIE Other Clinician: Referring Physician: [place], JULIE Treating Physician/Extender: Rudene Re in Treatment: 7 Visit Information History Since Last Visit Added or deleted any medications: No Patient Arrived: Wheel Chair Any new allergies or adverse reactions: No Arrival Time: 09:30 Had a fall or experienced change in No activities of daily living that may affect Accompanied By: spouse risk of falls: Transfer Assistance: Manual Signs or symptoms of abuse/neglect since last No Patient Identification Verified: Yes visito Secondary Verification Process Yes Hospitalized since last visit: No Completed: Pain Present Now: No Patient Has Alerts: No Electronic Signature(s) Signed: 03/18/2015 6:07:15 PM By: Curtis Sites Entered By: Curtis Sites on 03/18/2015 09:36:38 Rochon, Diva Judie Petit ([id number]) -------------------------------------------------------------------------------- Clinic Level of Care Assessment Details Patient Name: [name] Date of Service: 03/18/2015 10:00 AM Medical Record Number: [id number] Patient Account Number: [id number] Date of Birth/Sex: July 10, 1965 (49 y.o. Female) Treating RN: Curtis Sites Primary Care Physician: [place], JULIE Other Clinician: Referring Physician: [place], JULIE Treating Physician/Extender: Rudene Re in Treatment: 7 Clinic Level of Care Assessment Items TOOL 4 Quantity Score []  - Use when only an EandM is performed on FOLLOW-UP visit 0 ASSESSMENTS - Nursing Assessment / Reassessment X - Reassessment of Co-morbidities (includes updates in patient status) 1 10 X - Reassessment of  Adherence to Treatment Plan 1 5 ASSESSMENTS - Wound and Skin Assessment / Reassessment []  - Simple Wound Assessment / Reassessment - one wound 0 X - Complex Wound Assessment / Reassessment - multiple wounds 2 5 []  - Dermatologic / Skin Assessment (not related to wound area) 0 ASSESSMENTS - Focused Assessment []  - Circumferential Edema Measurements - multi extremities 0 []  - Nutritional Assessment / Counseling / Intervention 0 []  - Lower Extremity Assessment (monofilament, tuning fork, pulses) 0 []  - Peripheral Arterial Disease Assessment (using hand held doppler) 0 ASSESSMENTS - Ostomy and/or Continence Assessment and Care []  - Incontinence Assessment and Management 0 []  - Ostomy Care Assessment and Management (repouching, etc.) 0 PROCESS - Coordination of Care X - Simple Patient / Family Education for ongoing care 1 15 []  - Complex (extensive) Patient / Family Education for ongoing care 0 []  - Staff obtains Chiropractor, Records, Test Results / Process Orders 0 []  - Staff telephones HHA, Nursing Homes / Clarify orders / etc 0 []  - Routine Transfer to another Facility (non-emergent condition) 0 Holloman, Shalaine M. ([id number]) []  - Routine Hospital Admission (non-emergent condition) 0 []  - New Admissions / Manufacturing engineer / Ordering NPWT, Apligraf, etc. 0 []  - Emergency Hospital Admission (emergent condition) 0 X - Simple Discharge Coordination 1 10 []  - Complex (extensive) Discharge Coordination 0 PROCESS - Special Needs []  - Pediatric / Minor Patient Management 0 []  - Isolation Patient Management 0 []  - Hearing / Language / Visual special needs 0 []  - Assessment of Community assistance (transportation, D/C planning, etc.) 0 []  - Additional assistance / Altered mentation 0 []  - Support Surface(s) Assessment (bed, cushion, seat, etc.) 0 INTERVENTIONS - Wound Cleansing / Measurement []  - Simple Wound Cleansing - one wound 0 X - Complex Wound Cleansing - multiple wounds 2 5 X -  Wound Imaging (photographs - any number of wounds) 1 5 []  - Wound Tracing (instead of photographs) 0 []  - Simple Wound Measurement -  one wound 0 X - Complex Wound Measurement - multiple wounds 2 5 INTERVENTIONS - Wound Dressings X - Small Wound Dressing one or multiple wounds 2 10 []  - Medium Wound Dressing one or multiple wounds 0 []  - Large Wound Dressing one or multiple wounds 0 []  - Application of Medications - topical 0 []  - Application of Medications - injection 0 INTERVENTIONS - Miscellaneous []  - External ear exam 0 Penley, Ryleah M. ([id number]) []  - Specimen Collection (cultures, biopsies, blood, body fluids, etc.) 0 []  - Specimen(s) / Culture(s) sent or taken to Lab for analysis 0 []  - Patient Transfer (multiple staff / Michiel Sites Lift / Similar devices) 0 []  - Simple Staple / Suture removal (25 or less) 0 []  - Complex Staple / Suture removal (26 or more) 0 []  - Hypo / Hyperglycemic Management (close monitor of Blood Glucose) 0 []  - Ankle / Brachial Index (ABI) - do not check if billed separately 0 X - Vital Signs 1 5 Has the patient been seen at the hospital within the last three years: Yes Total Score: 100 Level Of Care: New/Established - Level 3 Electronic Signature(s) Signed: 03/18/2015 6:07:15 PM By: Curtis Sites Entered By: Curtis Sites on 03/18/2015 10:08:34 [name] ([id number]) -------------------------------------------------------------------------------- Encounter Discharge Information Details Patient Name: NJERI, VICENTE. Date of Service: 03/18/2015 10:00 AM Medical Record Number: [id number] Patient Account Number: [id number] Date of Birth/Sex: September 13, 1965 (49 y.o. Female) Treating RN: Curtis Sites Primary Care Physician: [place], JULIE Other Clinician: Referring Physician: [place], JULIE Treating Physician/Extender: Rudene Re in Treatment: 7 Encounter Discharge Information Items Discharge Pain Level: 0 Discharge Condition:  Stable Ambulatory Status: Wheelchair Discharge Destination: Home Transportation: Private Auto Accompanied By: spouse Schedule Follow-up Appointment: Yes Medication Reconciliation completed and provided to Patient/Care No Jasiya Markie: Provided on Clinical Summary of Care: 03/18/2015 Form Type Recipient Paper Patient CW Electronic Signature(s) Signed: 03/18/2015 10:26:15 AM By: Gwenlyn Perking Entered By: Gwenlyn Perking on 03/18/2015 10:26:15 Kartes, [name] ([id number]) -------------------------------------------------------------------------------- Multi Wound Chart Details Patient Name: [name] Date of Service: 03/18/2015 10:00 AM Medical Record Number: [id number] Patient Account Number: [id number] Date of Birth/Sex: [birth year] (49 y.o. Female) Treating RN: Curtis Sites Primary Care Physician: [place], JULIE Other Clinician: Referring Physician: [place], JULIE Treating Physician/Extender: Rudene Re in Treatment: 7 Vital Signs Height(in): 63 Pulse(bpm): 84 Weight(lbs): 100 Blood Pressure (mmHg): Body Mass Index(BMI): 18 Temperature(F): 97.6 Respiratory Rate 18 (breaths/min): Wound Assessments Treatment Notes Electronic Signature(s) Signed: 03/18/2015 6:07:15 PM By: Curtis Sites Entered By: Curtis Sites on 03/18/2015 09:39:18 Eisenberg, [name] ([id number]) -------------------------------------------------------------------------------- Multi-Disciplinary Care Plan Details Patient Name: DEMISHA, NOKES. Date of Service: 03/18/2015 10:00 AM Medical Record Number: [id number] Patient Account Number: [id number] Date of Birth/Sex: [birth year] (49 y.o. Female) Treating RN: Curtis Sites Primary Care Physician: [place], JULIE Other Clinician: Referring Physician: [place], JULIE Treating Physician/Extender: Rudene Re in Treatment: 7 Active Inactive Abuse / Safety / Falls / Self Care Management Nursing Diagnoses: Impaired home  maintenance Impaired physical mobility Potential for falls Self care deficit: actual or potential Goals: Patient/caregiver will verbalize understanding of skin care regimen Date Initiated: 01/27/2015 Goal Status: Active Patient/caregiver will verbalize/demonstrate measure taken to improve self care Date Initiated: 01/27/2015 Goal Status: Active Patient/caregiver will verbalize/demonstrate measures taken to improve the patient's personal safety Date Initiated: 01/27/2015 Goal Status: Active Patient/caregiver will verbalize/demonstrate measures taken to prevent injury and/or falls Date Initiated: 01/27/2015 Goal Status: Active Patient/caregiver will verbalize/demonstrate understanding of what to do in case of emergency Date Initiated: 01/27/2015 Goal Status: Active  Interventions: Assess fall risk on admission and as needed Assess: immobility, friction, shearing, incontinence upon admission and as needed Assess impairment of mobility on admission and as needed per policy Assess self care needs on admission and as needed Provide education on basic hygiene Provide education on fall prevention Provide education on personal and home safety Provide education on safe transfers ALAYSSA, FLINCHUM. ([id number]) Treatment Activities: Education provided on Basic Hygiene : 02/04/2015 Notes: Orientation to the Wound Care Program Nursing Diagnoses: Knowledge deficit related to the wound healing center program Goals: Patient/caregiver will verbalize understanding of the Wound Healing Center Program Date Initiated: 01/27/2015 Goal Status: Active Interventions: Provide education on orientation to the wound center Notes: Pressure Nursing Diagnoses: Knowledge deficit related to causes and risk factors for pressure ulcer development Knowledge deficit related to management of pressures ulcers Potential for impaired tissue integrity related to pressure, friction, moisture, and shear Goals: Patient  will remain free from development of additional pressure ulcers Date Initiated: 01/27/2015 Goal Status: Active Patient will remain free of pressure ulcers Date Initiated: 01/27/2015 Goal Status: Active Patient/caregiver will verbalize risk factors for pressure ulcer development Date Initiated: 01/27/2015 Goal Status: Active Patient/caregiver will verbalize understanding of pressure ulcer management Date Initiated: 01/27/2015 Goal Status: Active Interventions: Assess: immobility, friction, shearing, incontinence upon admission and as needed Assess offloading mechanisms upon admission and as needed Assess potential for pressure ulcer upon admission and as needed Provide education on pressure ulcers LAVETTE, YANKOVICH. ([id number]) Treatment Activities: Patient referred for pressure reduction/relief devices : 03/18/2015 Patient referred for seating evaluation to ensure proper offloading : 03/18/2015 Notes: Wound/Skin Impairment Nursing Diagnoses: Impaired tissue integrity Knowledge deficit related to smoking impact on wound healing Knowledge deficit related to ulceration/compromised skin integrity Goals: Patient/caregiver will verbalize understanding of skin care regimen Date Initiated: 01/27/2015 Goal Status: Active Ulcer/skin breakdown will have a volume reduction of 30% by week 4 Date Initiated: 01/27/2015 Goal Status: Active Ulcer/skin breakdown will have a volume reduction of 50% by week 8 Date Initiated: 01/27/2015 Goal Status: Active Ulcer/skin breakdown will have a volume reduction of 80% by week 12 Date Initiated: 01/27/2015 Goal Status: Active Ulcer/skin breakdown will heal within 14 weeks Date Initiated: 01/27/2015 Goal Status: Active Interventions: Assess ulceration(s) every visit Provide education on ulcer and skin care Notes: Electronic Signature(s) Signed: 03/18/2015 6:07:15 PM By: Curtis Sites Entered By: Curtis Sites on 03/18/2015 09:39:04 Mazer, [name]  ([id number]) -------------------------------------------------------------------------------- Patient/Caregiver Education Details Patient Name: [name] Date of Service: 03/18/2015 10:00 AM Medical Record Number: [id number] Patient Account Number: [id number] Date of Birth/Gender: [birth year] (49 y.o. Female) Treating RN: Curtis Sites Primary Care Physician: [place], JULIE Other Clinician: Referring Physician: [place], JULIE Treating Physician/Extender: Rudene Re in Treatment: 7 Education Assessment Education Provided To: Patient and Caregiver Education Topics Provided Wound/Skin Impairment: Handouts: Other: continue wound care as ordered Methods: Explain/Verbal Responses: State content correctly Electronic Signature(s) Signed: 03/18/2015 6:07:15 PM By: Curtis Sites Entered By: Curtis Sites on 03/18/2015 10:18:02 Akhter, [name] ([id number]) -------------------------------------------------------------------------------- Wound Assessment Details Patient Name: [name] Date of Service: 03/18/2015 10:00 AM Medical Record Number: [id number] Patient Account Number: [id number] Date of Birth/Sex: 01-Jan-1966 (49 y.o. Female) Treating RN: Curtis Sites Primary Care Physician: [place], JULIE Other Clinician: Referring Physician: [place], JULIE Treating Physician/Extender: Rudene Re in Treatment: 7 Wound Status Wound Number: 1 Primary Pressure Ulcer Etiology: Wound Location: Right Scapula Wound Open Wounding Event: Pressure Injury Status: Date Acquired: 09/21/2014 Comorbid Asthma, Chronic Obstructive Weeks Of Treatment: 7 History: Pulmonary Disease (COPD),  Clustered Wound: No Paraplegia Photos Photo Uploaded By: Curtis Sites on 03/18/2015 12:33:59 Wound Measurements Length: (cm) 1.3 Width: (cm) 0.9 Depth: (cm) 0.3 Area: (cm) 0.919 Volume: (cm) 0.276 % Reduction in Area: 25% % Reduction in Volume: 71.8% Epithelialization:  None Tunneling: No Undermining: Yes Starting Position (o'clock): 12 Ending Position (o'clock): 5 Maximum Distance: (cm) 1.9 Wound Description Classification: Category/Stage IV Wound Margin: Flat and Intact Exudate Amount: Large Exudate Type: Purulent Exudate Color: yellow, brown, green Foul Odor After Cleansing: No Wound Bed Whitecotton, Adrionna M. ([id number]) Granulation Amount: Large (67-100%) Exposed Structure Granulation Quality: Red, Pink Fascia Exposed: No Necrotic Amount: None Present (0%) Fat Layer Exposed: No Tendon Exposed: No Muscle Exposed: No Joint Exposed: No Bone Exposed: Yes Periwound Skin Texture Texture Color No Abnormalities Noted: No No Abnormalities Noted: No Callus: No Atrophie Blanche: No Crepitus: No Cyanosis: No Excoriation: No Ecchymosis: No Fluctuance: No Erythema: No Friable: No Hemosiderin Staining: No Induration: No Mottled: No Localized Edema: No Pallor: No Rash: No Rubor: No Scarring: No Temperature / Pain Moisture Temperature: No Abnormality No Abnormalities Noted: No Tenderness on Palpation: Yes Dry / Scaly: No Maceration: No Moist: Yes Wound Preparation Ulcer Cleansing: Rinsed/Irrigated with Saline Topical Anesthetic Applied: Other: lidocaine 4%, Treatment Notes Wound #1 (Right Scapula) 1. Cleansed with: Clean wound with Normal Saline 2. Anesthetic Topical Lidocaine 4% cream to wound bed prior to debridement 4. Dressing Applied: Aquacel Ag 5. Secondary Dressing Applied Bordered Foam Dressing Electronic Signature(s) Signed: 03/18/2015 6:07:15 PM By: Curtis Sites Entered By: Curtis Sites on 03/18/2015 09:50:05 Eyerman, [name] ([id number]) -------------------------------------------------------------------------------- Wound Assessment Details Patient Name: NAKEYIA, MENDEN. Date of Service: 03/18/2015 10:00 AM Medical Record Number: [id number] Patient Account Number: [id number] Date of Birth/Sex: [birth year] (49  y.o. Female) Treating RN: Curtis Sites Primary Care Physician: [place], JULIE Other Clinician: Referring Physician: [place], JULIE Treating Physician/Extender: Rudene Re in Treatment: 7 Wound Status Wound Number: 2 Primary Pressure Ulcer Etiology: Wound Location: Sacrum - Midline Wound Open Wounding Event: Pressure Injury Status: Date Acquired: 12/02/2007 Comorbid Asthma, Chronic Obstructive Weeks Of Treatment: 7 History: Pulmonary Disease (COPD), Clustered Wound: No Paraplegia Photos Photo Uploaded By: Curtis Sites on 03/18/2015 12:34:00 Wound Measurements Length: (cm) 5 Width: (cm) 4.9 Depth: (cm) 0.3 Area: (cm) 19.242 Volume: (cm) 5.773 % Reduction in Area: -2452% % Reduction in Volume: -7597.3% Epithelialization: None Tunneling: No Undermining: No Wound Description Classification: Category/Stage IV Wound Margin: Flat and Intact Exudate Amount: Medium Exudate Type: Serous Exudate Color: amber Foul Odor After Cleansing: Yes Due to Product Use: No Wound Bed Granulation Amount: Small (1-33%) Exposed Structure Granulation Quality: Pink Fascia Exposed: No Necrotic Amount: Large (67-100%) Fat Layer Exposed: No Necrotic Quality: Adherent Slough Tendon Exposed: No Tardiff, Maliha M. ([id number]) Muscle Exposed: No Joint Exposed: No Bone Exposed: Yes Periwound Skin Texture Texture Color No Abnormalities Noted: No No Abnormalities Noted: No Callus: No Atrophie Blanche: No Crepitus: No Cyanosis: No Excoriation: No Ecchymosis: No Fluctuance: No Erythema: No Friable: No Hemosiderin Staining: No Induration: No Mottled: No Localized Edema: No Pallor: No Rash: No Rubor: No Scarring: Yes Temperature / Pain Moisture Temperature: No Abnormality No Abnormalities Noted: No Tenderness on Palpation: Yes Dry / Scaly: No Maceration: No Moist: Yes Wound Preparation Ulcer Cleansing: Rinsed/Irrigated with Saline Topical Anesthetic Applied: Other:  lidocaine 4%, Treatment Notes Wound #2 (Midline Sacrum) 1. Cleansed with: Clean wound with Normal Saline 2. Anesthetic Topical Lidocaine 4% cream to wound bed prior to debridement 4. Dressing Applied: Aquacel Ag 5. Secondary Dressing Applied Bordered Foam  Dressing Electronic Signature(s) Signed: 03/18/2015 6:07:15 PM By: Curtis Sites Entered By: Curtis Sites on 03/18/2015 09:50:24 Cottman, [name] ([id number]) -------------------------------------------------------------------------------- Vitals Details Patient Name: ANNY, SAYLER. Date of Service: 03/18/2015 10:00 AM Medical Record Number: [id number] Patient Account Number: [id number] Date of Birth/Sex: Nov 18, 1965 (49 y.o. Female) Treating RN: Curtis Sites Primary Care Physician: [place], JULIE Other Clinician: Referring Physician: [place], JULIE Treating Physician/Extender: Rudene Re in Treatment: 7 Vital Signs Time Taken: 09:36 Temperature (F): 97.6 Height (in): 63 Pulse (bpm): 84 Weight (lbs): 100 Respiratory Rate (breaths/min): 18 Body Mass Index (BMI): 17.7 Reference Range: 80 - 120 mg / dl Electronic Signature(s) Signed: 03/18/2015 6:07:15 PM By: Curtis Sites Entered By: Curtis Sites on 03/18/2015 09:38:05

## 2015-03-25 ENCOUNTER — Encounter: Payer: Medicare Other | Admitting: Surgery

## 2015-03-25 DIAGNOSIS — L89114 Pressure ulcer of right upper back, stage 4: Secondary | ICD-10-CM | POA: Diagnosis not present

## 2015-03-26 NOTE — Progress Notes (Signed)
[name] ([id number]) Visit Report for 03/25/2015 Arrival Information Details Patient Name: [name], [name] Date of Service: 03/25/2015 10:00 AM Medical Record Number: [id number] Patient Account Number: [id number] Date of Birth/Sex: 1966/04/23 (49 y.o. Female) Treating RN: Curtis Sites Primary Care Physician: [place], JULIE Other Clinician: Referring Physician: [place], JULIE Treating Physician/Extender: Rudene Re in Treatment: 8 Visit Information History Since Last Visit Added or deleted any medications: No Patient Arrived: Wheel Chair Any new allergies or adverse reactions: No Arrival Time: 09:59 Had a fall or experienced change in No activities of daily living that may affect Accompanied By: spouse risk of falls: Transfer Assistance: Manual Signs or symptoms of abuse/neglect since last No Patient Identification Verified: Yes visito Secondary Verification Process Yes Hospitalized since last visit: No Completed: Pain Present Now: No Patient Has Alerts: No Electronic Signature(s) Signed: 03/25/2015 4:22:28 PM By: Curtis Sites Entered By: Curtis Sites on 03/25/2015 09:59:57 Curless, Christna Judie Petit ([id number]) -------------------------------------------------------------------------------- Clinic Level of Care Assessment Details Patient Name: [name] Date of Service: 03/25/2015 10:00 AM Medical Record Number: [id number] Patient Account Number: [id number] Date of Birth/Sex: Feb 23, 1966 (49 y.o. Female) Treating RN: Curtis Sites Primary Care Physician: [place], JULIE Other Clinician: Referring Physician: [place], JULIE Treating Physician/Extender: Rudene Re in Treatment: 8 Clinic Level of Care Assessment Items TOOL 4 Quantity Score  - Use when only an EandM is performed on FOLLOW-UP visit 0 ASSESSMENTS - Nursing Assessment / Reassessment X - Reassessment of Co-morbidities (includes updates in patient status) 1 10 X - Reassessment of  Adherence to Treatment Plan 1 5 ASSESSMENTS - Wound and Skin Assessment / Reassessment  - Simple Wound Assessment / Reassessment - one wound 0 X - Complex Wound Assessment / Reassessment - multiple wounds 2 5  - Dermatologic / Skin Assessment (not related to wound area) 0 ASSESSMENTS - Focused Assessment  - Circumferential Edema Measurements - multi extremities 0  - Nutritional Assessment / Counseling / Intervention 0  - Lower Extremity Assessment (monofilament, tuning fork, pulses) 0  - Peripheral Arterial Disease Assessment (using hand held doppler) 0 ASSESSMENTS - Ostomy and/or Continence Assessment and Care  - Incontinence Assessment and Management 0  - Ostomy Care Assessment and Management (repouching, etc.) 0 PROCESS - Coordination of Care X - Simple Patient / Family Education for ongoing care 1 15  - Complex (extensive) Patient / Family Education for ongoing care 0  - Staff obtains Chiropractor, Records, Test Results / Process Orders 0  - Staff telephones HHA, Nursing Homes / Clarify orders / etc 0  - Routine Transfer to another Facility (non-emergent condition) 0 Hiller, Addie M. ([id number])  - Routine Hospital Admission (non-emergent condition) 0  - New Admissions / Manufacturing engineer / Ordering NPWT, Apligraf, etc. 0  - Emergency Hospital Admission (emergent condition) 0 X - Simple Discharge Coordination 1 10  - Complex (extensive) Discharge Coordination 0 PROCESS - Special Needs  - Pediatric / Minor Patient Management 0  - Isolation Patient Management 0  - Hearing / Language / Visual special needs 0  - Assessment of Community assistance (transportation, D/C planning, etc.) 0  - Additional assistance / Altered mentation 0  - Support Surface(s) Assessment (bed, cushion, seat, etc.) 0 INTERVENTIONS - Wound Cleansing / Measurement  - Simple Wound Cleansing - one wound 0 X - Complex Wound Cleansing - multiple wounds 2 5 X -  Wound Imaging (photographs - any number of wounds) 1 5  - Wound Tracing (instead of photographs) 0  - Simple Wound Measurement -  one wound 0 X - Complex Wound Measurement - multiple wounds 2 5 INTERVENTIONS - Wound Dressings X - Small Wound Dressing one or multiple wounds 2 10 []  - Medium Wound Dressing one or multiple wounds 0 []  - Large Wound Dressing one or multiple wounds 0 []  - Application of Medications - topical 0 []  - Application of Medications - injection 0 INTERVENTIONS - Miscellaneous []  - External ear exam 0 Adderly, Candida M. ([id number]) []  - Specimen Collection (cultures, biopsies, blood, body fluids, etc.) 0 []  - Specimen(s) / Culture(s) sent or taken to Lab for analysis 0 []  - Patient Transfer (multiple staff / Michiel Sites Lift / Similar devices) 0 []  - Simple Staple / Suture removal (25 or less) 0 []  - Complex Staple / Suture removal (26 or more) 0 []  - Hypo / Hyperglycemic Management (close monitor of Blood Glucose) 0 []  - Ankle / Brachial Index (ABI) - do not check if billed separately 0 X - Vital Signs 1 5 Has the patient been seen at the hospital within the last three years: Yes Total Score: 100 Level Of Care: New/Established - Level 3 Electronic Signature(s) Signed: 03/25/2015 4:22:28 PM By: Curtis Sites Entered By: Curtis Sites on 03/25/2015 10:20:21 Delio, [name] ([id number]) -------------------------------------------------------------------------------- Encounter Discharge Information Details Patient Name: [name] Date of Service: 03/25/2015 10:00 AM Medical Record Number: [id number] Patient Account Number: [id number] Date of Birth/Sex: [birth year] (49 y.o. Female) Treating RN: Curtis Sites Primary Care Physician: [place], JULIE Other Clinician: Referring Physician: [place], JULIE Treating Physician/Extender: Rudene Re in Treatment: 8 Encounter Discharge Information Items Discharge Pain Level: 0 Discharge Condition:  Stable Ambulatory Status: Wheelchair Discharge Destination: Home Transportation: Private Auto Accompanied By: spouse Schedule Follow-up Appointment: Yes Medication Reconciliation completed and provided to Patient/Care No Provider: Provided on Clinical Summary of Care: 03/25/2015 Form Type Recipient Paper Patient CW Electronic Signature(s) Signed: 03/25/2015 10:34:35 AM By: Gwenlyn Perking Entered By: Gwenlyn Perking on 03/25/2015 10:34:35 Kuri, [name] ([id number]) -------------------------------------------------------------------------------- Multi Wound Chart Details Patient Name: [name] Date of Service: 03/25/2015 10:00 AM Medical Record Number: [id number] Patient Account Number: [id number] Date of Birth/Sex: 1965-08-19 (49 y.o. Female) Treating RN: Curtis Sites Primary Care Physician: [place], JULIE Other Clinician: Referring Physician: [place], JULIE Treating Physician/Extender: Rudene Re in Treatment: 8 Vital Signs Height(in): 63 Pulse(bpm): 74 Weight(lbs): 100 Blood Pressure 135/82 (mmHg): Body Mass Index(BMI): 18 Temperature(F): 98.2 Respiratory Rate 18 (breaths/min): Photos: [1:No Photos] [2:No Photos] [N/A:N/A] Wound Location: [1:Right Scapula] [2:Sacrum - Midline] [N/A:N/A] Wounding Event: [1:Pressure Injury] [2:Pressure Injury] [N/A:N/A] Primary Etiology: [1:Pressure Ulcer] [2:Pressure Ulcer] [N/A:N/A] Comorbid History: [1:Asthma, Chronic Obstructive Pulmonary Disease (COPD), Paraplegia] [2:Asthma, Chronic Obstructive Pulmonary Disease (COPD), Paraplegia] [N/A:N/A] Date Acquired: [1:09/21/2014] [2:12/02/2007] [N/A:N/A] Weeks of Treatment: [1:8] [2:8] [N/A:N/A] Wound Status: [1:Open] [2:Open] [N/A:N/A] Measurements L x W x D 1.2x1.1x0.3 [2:4.9x4.7x0.2] [N/A:N/A] (cm) Area (cm) : [1:1.037] [2:18.088] [N/A:N/A] Volume (cm) : [1:0.311] [2:3.618] [N/A:N/A] % Reduction in Area: [1:15.30%] [2:-2298.90%] [N/A:N/A] % Reduction in Volume:  68.30% [2:-4724.00%] [N/A:N/A] Starting Position 1 11 (o'clock): Ending Position 1 [1:6] (o'clock): Maximum Distance 1 1.8 (cm): Undermining: [1:Yes] [2:No] [N/A:N/A] 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 ([id number]) Odor Anticipated Due to N/A No N/A Product Use: Wound Margin: Flat and Intact Flat and Intact N/A Granulation Amount: Large (67-100%) Small (1-33%) N/A Granulation Quality: Red, Pink Pink N/A Necrotic Amount: None Present (0%)  Large (67-100%) N/A Exposed Structures: Bone: Yes Bone: Yes N/A Fascia: No Fascia: No Fat: No Fat: No Tendon: No Tendon: No Muscle: No Muscle: No Joint: No Joint: No Epithelialization: None None N/A Periwound Skin Texture: Edema: No Scarring: Yes N/A Excoriation: No Edema: No Induration: No Excoriation: No Callus: No Induration: No Crepitus: No Callus: No Fluctuance: No Crepitus: No Friable: No Fluctuance: No Rash: No Friable: No Scarring: No Rash: No Periwound Skin Moist: Yes Moist: Yes N/A Moisture: Maceration: No Maceration: No Dry/Scaly: No Dry/Scaly: No Periwound Skin Color: Atrophie Blanche: No Atrophie Blanche: No N/A Cyanosis: No Cyanosis: No Ecchymosis: No Ecchymosis: No Erythema: No Erythema: No Hemosiderin Staining: No Hemosiderin Staining: No Mottled: No Mottled: No Pallor: No Pallor: No Rubor: No Rubor: No Temperature: No Abnormality No Abnormality N/A Tenderness on Yes Yes N/A Palpation: Wound Preparation: Ulcer Cleansing: Ulcer Cleansing: N/A Rinsed/Irrigated with Rinsed/Irrigated with Saline Saline Topical Anesthetic Topical Anesthetic Applied: Other: lidocaine Applied: Other: lidocaine 4% 4% Treatment Notes Electronic  Signature(s) Signed: 03/25/2015 4:22:28 PM By: Curtis Sites Entered By: Curtis Sites on 03/25/2015 10:15:58 Mccrone, VELTA ROCKHOLT ([id number]) Hopf, [name] ([id number]) -------------------------------------------------------------------------------- Multi-Disciplinary Care Plan Details Patient Name: MARNESHA, GAGEN. Date of Service: 03/25/2015 10:00 AM Medical Record Number: [id number] Patient Account Number: [id number] Date of Birth/Sex: 1965/08/31 (49 y.o. Female) Treating RN: Curtis Sites Primary Care Physician: [place], JULIE Other Clinician: Referring Physician: [place], JULIE Treating Physician/Extender: Rudene Re in Treatment: 8 Active Inactive Abuse / Safety / Falls / Self Care Management Nursing Diagnoses: Impaired home maintenance Impaired physical mobility Potential for falls Self care deficit: actual or potential Goals: Patient/caregiver will verbalize understanding of skin care regimen Date Initiated: 01/27/2015 Goal Status: Active Patient/caregiver will verbalize/demonstrate measure taken to improve self care Date Initiated: 01/27/2015 Goal Status: Active Patient/caregiver will verbalize/demonstrate measures taken to improve the patient's personal safety Date Initiated: 01/27/2015 Goal Status: Active Patient/caregiver will verbalize/demonstrate measures taken to prevent injury and/or falls Date Initiated: 01/27/2015 Goal Status: Active Patient/caregiver will verbalize/demonstrate understanding of what to do in case of emergency Date Initiated: 01/27/2015 Goal Status: Active Interventions: Assess fall risk on admission and as needed Assess: immobility, friction, shearing, incontinence upon admission and as needed Assess impairment of mobility on admission and as needed per policy Assess self care needs on admission and as needed Provide education on basic hygiene Provide education on fall prevention Provide education on personal and home  safety Provide education on safe transfers CIARRA, BRADDY ([id number]) Treatment Activities: Education provided on Basic Hygiene : 02/04/2015 Notes: Orientation to the Wound Care Program Nursing Diagnoses: Knowledge deficit related to the wound healing center program Goals: Patient/caregiver will verbalize understanding of the Wound Healing Center Program Date Initiated: 01/27/2015 Goal Status: Active Interventions: Provide education on orientation to the wound center Notes: Pressure Nursing Diagnoses: Knowledge deficit related to causes and risk factors for pressure ulcer development Knowledge deficit related to management of pressures ulcers Potential for impaired tissue integrity related to pressure, friction, moisture, and shear Goals: Patient will remain free from development of additional pressure ulcers Date Initiated: 01/27/2015 Goal Status: Active Patient will remain free of pressure ulcers Date Initiated: 01/27/2015 Goal Status: Active Patient/caregiver will verbalize risk factors for pressure ulcer development Date Initiated: 01/27/2015 Goal Status: Active Patient/caregiver will verbalize understanding of pressure ulcer management Date Initiated: 01/27/2015 Goal Status: Active Interventions: Assess: immobility, friction, shearing, incontinence upon admission and as needed Assess offloading mechanisms upon admission and as needed Assess potential for pressure ulcer upon admission and as needed  Provide education on pressure ulcers HAREEM, SUROWIEC. ([id number]) Treatment Activities: Patient referred for pressure reduction/relief devices : 03/25/2015 Patient referred for seating evaluation to ensure proper offloading : 03/25/2015 Notes: Wound/Skin Impairment Nursing Diagnoses: Impaired tissue integrity Knowledge deficit related to smoking impact on wound healing Knowledge deficit related to ulceration/compromised skin integrity Goals: Patient/caregiver will  verbalize understanding of skin care regimen Date Initiated: 01/27/2015 Goal Status: Active Ulcer/skin breakdown will have a volume reduction of 30% by week 4 Date Initiated: 01/27/2015 Goal Status: Active Ulcer/skin breakdown will have a volume reduction of 50% by week 8 Date Initiated: 01/27/2015 Goal Status: Active Ulcer/skin breakdown will have a volume reduction of 80% by week 12 Date Initiated: 01/27/2015 Goal Status: Active Ulcer/skin breakdown will heal within 14 weeks Date Initiated: 01/27/2015 Goal Status: Active Interventions: Assess ulceration(s) every visit Provide education on ulcer and skin care Notes: Electronic Signature(s) Signed: 03/25/2015 4:22:28 PM By: Curtis Sites Entered By: Curtis Sites on 03/25/2015 10:15:50 Banik, [name] ([id number]) -------------------------------------------------------------------------------- Patient/Caregiver Education Details Patient Name: [name] Date of Service: 03/25/2015 10:00 AM Medical Record Number: [id number] Patient Account Number: [id number] Date of Birth/Gender: August 11, 1965 (49 y.o. Female) Treating RN: Curtis Sites Primary Care Physician: [place], JULIE Other Clinician: Referring Physician: [place], JULIE Treating Physician/Extender: Rudene Re in Treatment: 8 Education Assessment Education Provided To: Patient and Caregiver Education Topics Provided Pressure: Handouts: Other: continue pressure relief measures Methods: Explain/Verbal Responses: State content correctly Electronic Signature(s) Signed: 03/25/2015 4:22:28 PM By: Curtis Sites Entered By: Curtis Sites on 03/25/2015 10:19:10 Heiney, [name] ([id number]) -------------------------------------------------------------------------------- Wound Assessment Details Patient Name: CRESTA, RIDEN. Date of Service: 03/25/2015 10:00 AM Medical Record Number: [id number] Patient Account Number: [id number] Date of Birth/Sex: September 17, 1965  (49 y.o. Female) Treating RN: Curtis Sites Primary Care Physician: [place], JULIE Other Clinician: Referring Physician: [place], JULIE Treating Physician/Extender: Rudene Re in Treatment: 8 Wound Status Wound Number: 1 Primary Pressure Ulcer Etiology: Wound Location: Right Scapula Wound Open Wounding Event: Pressure Injury Status: Date Acquired: 09/21/2014 Comorbid Asthma, Chronic Obstructive Weeks Of Treatment: 8 History: Pulmonary Disease (COPD), Clustered Wound: No Paraplegia Photos Photo Uploaded By: Curtis Sites on 03/25/2015 11:21:13 Wound Measurements Length: (cm) 1.2 Width: (cm) 1.1 Depth: (cm) 0.3 Area: (cm) 1.037 Volume: (cm) 0.311 % Reduction in Area: 15.3% % Reduction in Volume: 68.3% Epithelialization: None Tunneling: No Undermining: Yes Starting Position (o'clock): 11 Ending Position (o'clock): 6 Maximum Distance: (cm) 1.8 Wound Description Classification: Category/Stage IV Wound Margin: Flat and Intact Exudate Amount: Large Exudate Type: Purulent Exudate Color: yellow, brown, green Foul Odor After Cleansing: No Wound Bed Moller, Fujiko M. ([id number]) Granulation Amount: Large (67-100%) Exposed Structure Granulation Quality: Red, Pink Fascia Exposed: No Necrotic Amount: None Present (0%) Fat Layer Exposed: No Tendon Exposed: No Muscle Exposed: No Joint Exposed: No Bone Exposed: Yes Periwound Skin Texture Texture Color No Abnormalities Noted: No No Abnormalities Noted: No Callus: No Atrophie Blanche: No Crepitus: No Cyanosis: No Excoriation: No Ecchymosis: No Fluctuance: No Erythema: No Friable: No Hemosiderin Staining: No Induration: No Mottled: No Localized Edema: No Pallor: No Rash: No Rubor: No Scarring: No Temperature / Pain Moisture Temperature: No Abnormality No Abnormalities Noted: No Tenderness on Palpation: Yes Dry / Scaly: No Maceration: No Moist: Yes Wound Preparation Ulcer Cleansing:  Rinsed/Irrigated with Saline Topical Anesthetic Applied: Other: lidocaine 4%, Treatment Notes Wound #1 (Right Scapula) 1. Cleansed with: Clean wound with Normal Saline 2. Anesthetic Topical Lidocaine 4% cream to wound bed prior to debridement 3. Peri-wound Care: Skin Prep 4. Dressing  Applied: Aquacel Ag 5. Secondary Dressing Applied Bordered Foam Dressing Electronic Signature(s) Signed: 03/25/2015 4:22:28 PM By: Curtis Sites Entered By: Curtis Sites on 03/25/2015 10:09:54 Serpe, CAYLEA FORONDA ([id number]) Mclees, [name] ([id number]) -------------------------------------------------------------------------------- Wound Assessment Details Patient Name: ROSALIND, GUIDO. Date of Service: 03/25/2015 10:00 AM Medical Record Number: [id number] Patient Account Number: [id number] Date of Birth/Sex: Apr 28, 1966 (49 y.o. Female) Treating RN: Curtis Sites Primary Care Physician: [place], JULIE Other Clinician: Referring Physician: [place], JULIE Treating Physician/Extender: Rudene Re in Treatment: 8 Wound Status Wound Number: 2 Primary Pressure Ulcer Etiology: Wound Location: Sacrum - Midline Wound Open Wounding Event: Pressure Injury Status: Date Acquired: 12/02/2007 Comorbid Asthma, Chronic Obstructive Weeks Of Treatment: 8 History: Pulmonary Disease (COPD), Clustered Wound: No Paraplegia Photos Photo Uploaded By: Curtis Sites on 03/25/2015 11:21:14 Wound Measurements Length: (cm) 4.9 Width: (cm) 4.7 Depth: (cm) 0.2 Area: (cm) 18.088 Volume: (cm) 3.618 % Reduction in Area: -2298.9% % Reduction in Volume: -4724% Epithelialization: None Tunneling: No Undermining: No Wound Description Classification: Category/Stage IV Wound Margin: Flat and Intact Exudate Amount: Medium Exudate Type: Serous Exudate Color: amber Foul Odor After Cleansing: Yes Due to Product Use: No Wound Bed Granulation Amount: Small (1-33%) Exposed Structure Granulation Quality:  Pink Fascia Exposed: No Necrotic Amount: Large (67-100%) Fat Layer Exposed: No Necrotic Quality: Adherent Slough Tendon Exposed: No Encarnacion, Nekia M. ([id number]) Muscle Exposed: No Joint Exposed: No Bone Exposed: Yes Periwound Skin Texture Texture Color No Abnormalities Noted: No No Abnormalities Noted: No Callus: No Atrophie Blanche: No Crepitus: No Cyanosis: No Excoriation: No Ecchymosis: No Fluctuance: No Erythema: No Friable: No Hemosiderin Staining: No Induration: No Mottled: No Localized Edema: No Pallor: No Rash: No Rubor: No Scarring: Yes Temperature / Pain Moisture Temperature: No Abnormality No Abnormalities Noted: No Tenderness on Palpation: Yes Dry / Scaly: No Maceration: No Moist: Yes Wound Preparation Ulcer Cleansing: Rinsed/Irrigated with Saline Topical Anesthetic Applied: Other: lidocaine 4%, Treatment Notes Wound #2 (Midline Sacrum) 1. Cleansed with: Clean wound with Normal Saline 2. Anesthetic Topical Lidocaine 4% cream to wound bed prior to debridement 3. Peri-wound Care: Skin Prep 4. Dressing Applied: Aquacel Ag 5. Secondary Dressing Applied Bordered Foam Dressing Electronic Signature(s) Signed: 03/25/2015 4:22:28 PM By: Curtis Sites Entered By: Curtis Sites on 03/25/2015 10:13:25 Mccarley, [name] ([id number]) -------------------------------------------------------------------------------- Vitals Details Patient Name: SHACOLA, SCHUSSLER. Date of Service: 03/25/2015 10:00 AM Medical Record Number: [id number] Patient Account Number: [id number] Date of Birth/Sex: 06-Sep-1965 (49 y.o. Female) Treating RN: Curtis Sites Primary Care Physician: [place], JULIE Other Clinician: Referring Physician: [place], JULIE Treating Physician/Extender: Rudene Re in Treatment: 8 Vital Signs Time Taken: 10:00 Temperature (F): 98.2 Height (in): 63 Pulse (bpm): 74 Weight (lbs): 100 Respiratory Rate (breaths/min): 18 Body Mass Index  (BMI): 17.7 Blood Pressure (mmHg): 135/82 Reference Range: 80 - 120 mg / dl Electronic Signature(s) Signed: 03/25/2015 4:22:28 PM By: Curtis Sites Entered By: Curtis Sites on 03/25/2015 10:01:57

## 2015-03-26 NOTE — Progress Notes (Signed)
[name], [name] ([id number]) Visit Report for 03/25/2015 Chief Complaint Document Details Patient Name: [name], [name] 03/25/2015 10:00 Date of Service: AM Medical Record [id number] Number: Patient Account Number: [id number] [birth year] (49 y.o. Treating RN: Curtis Sites Date of Birth/Sex: Female) Other Clinician: Primary Care Physician: [place], JULIE Treating Evlyn Kanner Referring Physician: [place], JULIE Physician/Extender: Weeks in Treatment: 8 Information Obtained from: Patient Chief Complaint Patient presents to the wound care center for a consult due non healing wound. 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 She comes in for a pressure injury to the right scapular region and the sacral region. She has this for several months. Electronic Signature(s) Signed: 03/25/2015 10:27:40 AM By: Evlyn Kanner MD, FACS Entered By: Evlyn Kanner on 03/25/2015 10:27:40 [name], [name] -------------------------------------------------------------------------------- HPI Details Patient Name: [name], [name] 03/25/2015 10:00 Date of Service: AM Medical Record [id number] Number: Patient Account Number: [id number] Jul 21, 1965 (49 y.o. 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 For about 4-5 months she's had a decubitus ulcer on the right scapular region and she has a couple of small areas which have opened out in the region of previous scar tissue in the sacral region. He does not have any other significant comorbidities. She has a good Roho  cushion for her wheelchair and she has a specialized mattress for sleep. She has stopped smoking a year ago. 02/11/2015 -- she has not yet got her appointment with the plastic surgeons at [facility]. They did a x-ray of the sacral region this afternoon just before coming here. 02/18/2015 -- X-ray of the pelvis -- IMPRESSION:No acute osseous injury of the pelvis. If there is clinical concern regarding osteomyelitis, recommend further evaluation with CT. X-ray of the sacrum and coccyx IMPRESSION:No acute osseous abnormality of the sacrum. If there is further clinical concern regarding osteomyelitis of the sacrum, further evaluation with CT is recommended. 03/11/2015 -- she has yet to obtain the appointment with the plastic surgeons at [facility]. She says she will be diligent and follow-up on this. 03/18/2015 -- she has an appointment to see the plastic surgeons at [facility] this coming Monday. Also has some issues with her air mattress and is working on getting this replaced. 03/25/2015 -- her air mattress is working okay now and she does not yet have a firm appointment to see her plastic surgeons. Electronic Signature(s) Signed: 03/25/2015 10:28:29 AM By: Evlyn Kanner MD, FACS Entered By: Evlyn Kanner on 03/25/2015 10:28:29 [name], [name] ([id number]) -------------------------------------------------------------------------------- Physical Exam Details Patient Name: [name], [name] 03/25/2015 10:00 Date of Service: AM Medical Record [id number] Number: Patient Account Number: [id number] August 09, 1965 (49 y.o. Treating RN: Curtis Sites Date of Birth/Sex: Female) Other Clinician: Primary Care Physician: [place], JULIE Treating Evlyn Kanner Referring Physician: [place], JULIE Physician/Extender: Weeks in Treatment: 8 Constitutional . Pulse regular. Respirations normal and unlabored. Afebrile. . Eyes Nonicteric. Reactive to light. Ears, Nose, Mouth, and Throat Lips, teeth,  and gums WNL.Marland Kitchen Moist mucosa without lesions . Neck supple and nontender. No palpable supraclavicular or cervical adenopathy. Normal sized without goiter. Respiratory WNL. No retractions.. Breath sounds WNL, No rubs, rales, rhonchi, or wheeze.. Cardiovascular Heart rhythm and rate regular, no  murmur or gallop.. Pedal Pulses WNL. No clubbing, cyanosis or edema. Chest Breasts symmetical and no nipple discharge.. Breast tissue WNL, no masses, lumps, or tenderness.. Lymphatic No adneopathy. No adenopathy. No adenopathy. Musculoskeletal Adexa without tenderness or enlargement.. Digits and nails w/o clubbing, cyanosis, infection, petechiae, ischemia, or inflammatory conditions.. Integumentary (Hair, Skin) No suspicious lesions. No crepitus or fluctuance. No peri-wound warmth or erythema. No masses.Marland Kitchen Psychiatric Judgement and insight Intact.. No evidence of depression, anxiety, or agitation.. Notes The shoulder wound has minimal slough and the undermining is as before. The sacral wound looks about the same and there is still a tiny bit of bone probing down. Electronic Signature(s) Signed: 03/25/2015 10:29:12 AM By: Evlyn Kanner MD, FACS Entered By: Evlyn Kanner on 03/25/2015 10:29:11 [name], [name] ([id number]) -------------------------------------------------------------------------------- Physician Orders Details Patient Name: DELFINA, SCHREURS 03/25/2015 10:00 Date of Service: AM Medical Record [id number] Number: Patient Account Number: [id number] [birth year] (49 y.o. Treating RN: Curtis Sites Date of Birth/Sex: Female) Other Clinician: Primary Care Physician: [place], JULIE Treating Evlyn Kanner Referring Physician: [place], JULIE Physician/Extender: Weeks in Treatment: 8 Verbal / Phone Orders: Yes Clinician: Curtis Sites Read Back and Verified: Yes Diagnosis Coding Wound Cleansing Wound #1 Right Scapula o Cleanse wound with mild soap and water o May Shower, gently  pat wound dry prior to applying new dressing. Wound #2 Midline Sacrum o Cleanse wound with mild soap and water o May Shower, gently pat wound dry prior to applying new dressing. Primary Wound Dressing Wound #1 Right Scapula o Aquacel Ag - rope; lightly pack undermining (11-5) Wound #2 Midline Sacrum o Aquacel Ag - rope Secondary Dressing Wound #1 Right Scapula o Boardered Foam Dressing Wound #2 Midline Sacrum o Boardered Foam Dressing Dressing Change Frequency Wound #1 Right Scapula o Change dressing every other day. Wound #2 Midline Sacrum o Change dressing every other day. Follow-up Appointments Wound #1 Right Scapula o Return Appointment in 1 week. [name], [name] ([id number]) Off-Loading Wound #1 Right Scapula o Turn and reposition every 2 hours Wound #2 Midline Sacrum o Turn and reposition every 2 hours Home Health Wound #1 Right Scapula o Continue Home Health Visits - [facility] o Home Health Nurse may visit PRN to address patientos wound care needs. o FACE TO FACE ENCOUNTER: MEDICARE and MEDICAID PATIENTS: I certify that this patient is under my care and that I had a face-to-face encounter that meets the physician face-to-face encounter requirements with this patient on this date. The encounter with the patient was in whole or in part for the following MEDICAL CONDITION: (primary reason for Home Healthcare) MEDICAL NECESSITY: I certify, that based on my findings, NURSING services are a medically necessary home health service. HOME BOUND STATUS: I certify that my clinical findings support that this patient is homebound (i.e., Due to illness or injury, pt requires aid of supportive devices such as crutches, cane, wheelchairs, walkers, the use of special transportation or the assistance of another person to leave their place of residence. There is a normal inability to leave the home and doing so requires considerable and taxing  effort. Other absences are for medical reasons / religious services and are infrequent or of short duration when for other reasons). o If current dressing causes regression in wound condition, may D/C ordered dressing product/s and apply Normal Saline Moist Dressing daily until next Wound Healing Center / Other MD appointment. Notify Wound Healing Center of regression in wound condition at [phone]. o Please direct any NON-WOUND related issues/requests for orders  to patient's Primary Care Physician Wound #2 Midline Sacrum o Continue Home Health Visits - [facility] o Home Health Nurse may visit PRN to address patientos wound care needs. o FACE TO FACE ENCOUNTER: MEDICARE and MEDICAID PATIENTS: I certify that this patient is under my care and that I had a face-to-face encounter that meets the physician face-to-face encounter requirements with this patient on this date. The encounter with the patient was in whole or in part for the following MEDICAL CONDITION: (primary reason for Home Healthcare) MEDICAL NECESSITY: I certify, that based on my findings, NURSING services are a medically necessary home health service. HOME BOUND STATUS: I certify that my clinical findings support that this patient is homebound (i.e., Due to illness or injury, pt requires aid of supportive devices such as crutches, cane, wheelchairs, walkers, the use of special transportation or the assistance of another person to leave their place of residence. There is a normal inability to leave the home and doing so requires considerable and taxing effort. Other absences are for medical reasons / religious services and are infrequent or of short duration when for other reasons). o If current dressing causes regression in wound condition, may D/C ordered dressing product/s and apply Normal Saline Moist Dressing daily until next Wound Healing Center / Other MD appointment. Notify Wound Healing Center of  regression in wound condition at [phone]. o Please direct any NON-WOUND related issues/requests for orders to patient's Primary Care Physician [name], [name] ([id number]) Electronic Signature(s) Signed: 03/25/2015 3:44:09 PM By: Evlyn Kanner MD, FACS Signed: 03/25/2015 4:22:28 PM By: Curtis Sites Entered By: Curtis Sites on 03/25/2015 10:16:28 [name], [name] ([id number]) -------------------------------------------------------------------------------- Problem List Details Patient Name: ZYONNA, VARDAMAN 03/25/2015 10:00 Date of Service: AM Medical Record [id number] Number: Patient Account Number: [id number] March 21, 1966 (49 y.o. 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 03/25/2015 10:00 Date of Service: AM Medical Record [id number] Number: Patient Account Number: [id number] August 14, 1965 (49 y.o. Treating RN: Curtis Sites Date of Birth/Sex: Female) Other Clinician: Primary Care Physician: [place], JULIE Treating Evlyn Kanner Referring Physician: [place], JULIE Physician/Extender: Tania Ade in Treatment: 8 Subjective Chief Complaint Information obtained from  Patient Patient presents to the wound care center for a consult due non healing wound. 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 She comes in for a pressure injury to the right scapular region and the sacral region. She has this for several months. 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 For about 4-5 months she's had a decubitus ulcer on the right scapular region and she has a couple of small areas which have opened out in the region of previous scar tissue in the sacral region. He does not have any other significant comorbidities. She has a good Roho cushion for her  wheelchair and she has a specialized mattress for sleep. She has stopped smoking a year ago. 02/11/2015 -- she has not yet got her appointment with the plastic surgeons at [facility]. They did a x-ray of the sacral region this afternoon just before coming here. 02/18/2015 -- X-ray of the pelvis -- IMPRESSION:No acute osseous injury of the pelvis. If there is clinical concern regarding osteomyelitis, recommend further evaluation with CT. X-ray of the sacrum and coccyx IMPRESSION:No acute osseous abnormality of the sacrum. If there is further clinical concern regarding osteomyelitis of the sacrum, further evaluation with CT is recommended. 03/11/2015 -- she has yet to obtain the appointment with the plastic surgeons at [facility]. She says she will be diligent and follow-up on this. 03/18/2015 -- she has an appointment to see the plastic surgeons at [facility] this coming Monday. Also has some issues with her air mattress and is working on getting this replaced. 03/25/2015 -- her air mattress is working okay now and she does not yet have a firm appointment to see her plastic surgeons. Objective [name], Peyton M.  ([id number]) Constitutional Pulse regular. Respirations normal and unlabored. Afebrile. Vitals Time Taken: 10:00 AM, Height: 63 in, Weight: 100 lbs, BMI: 17.7, Temperature: 98.2 F, Pulse: 74 bpm, Respiratory Rate: 18 breaths/min, Blood Pressure: 135/82 mmHg. Eyes Nonicteric. Reactive to light. Ears, Nose, Mouth, and Throat Lips, teeth, and gums WNL.Marland Kitchen Moist mucosa without lesions . Neck supple and nontender. No palpable supraclavicular or cervical adenopathy. Normal sized without goiter. Respiratory WNL. No retractions.. Breath sounds WNL, No rubs, rales, rhonchi, or wheeze.. Cardiovascular Heart rhythm and rate regular, no murmur or gallop.. Pedal Pulses WNL. No clubbing, cyanosis or edema. Chest Breasts symmetical and no nipple discharge.. Breast tissue WNL, no masses, lumps, or tenderness.. Lymphatic No adneopathy. No adenopathy. No adenopathy. Musculoskeletal Adexa without tenderness or enlargement.. Digits and nails w/o clubbing, cyanosis, infection, petechiae, ischemia, or inflammatory conditions.Marland Kitchen Psychiatric Judgement and insight Intact.. No evidence of depression, anxiety, or agitation.. General Notes: The shoulder wound has minimal slough and the undermining is as before. The sacral wound looks about the same and there is still a tiny bit of bone probing down. Integumentary (Hair, Skin) No suspicious lesions. No crepitus or fluctuance. No peri-wound warmth or erythema. No masses.. Wound #1 status is Open. Original cause of wound was Pressure Injury. The wound is located on the Right Scapula. The wound measures 1.2cm length x 1.1cm width x 0.3cm depth; 1.037cm^2 area and 0.311cm^3 volume. There is bone exposed. There is no tunneling noted, however, there is undermining starting at 11:00 and ending at 6:00 with a maximum distance of 1.8cm. There is a large amount of purulent drainage noted. The wound margin is flat and intact. There is large (67-100%) red, pink granulation  within the wound bed. There is no necrotic tissue within the wound bed. The periwound skin appearance exhibited: Moist. The periwound skin appearance did not exhibit: Callus, Crepitus, Excoriation, Fluctuance, Friable, Induration, Localized Edema, Rash, Scarring, Dry/Scaly, Maceration, Atrophie Blanche, Cyanosis, Mcdevitt, Tomekia M. ([id number]) Ecchymosis, Hemosiderin Staining, Mottled, Pallor, Rubor, Erythema. Periwound temperature was noted as No Abnormality. The periwound has tenderness on palpation. Wound #2 status is Open. Original cause of wound was Pressure Injury. The wound is located on the Midline Sacrum. The wound measures 4.9cm length x 4.7cm width x 0.2cm depth; 18.088cm^2 area and 3.618cm^3 volume. There is bone exposed. There is no tunneling or undermining noted. There is a medium amount of serous drainage noted. The wound margin  is flat and intact. There is small (1-33%) pink granulation within the wound bed. There is a large (67-100%) amount of necrotic tissue within the wound bed including Adherent Slough. The periwound skin appearance exhibited: Scarring, Moist. The periwound skin appearance did not exhibit: Callus, Crepitus, Excoriation, Fluctuance, Friable, Induration, Localized Edema, Rash, Dry/Scaly, Maceration, Atrophie Blanche, Cyanosis, Ecchymosis, Hemosiderin Staining, Mottled, Pallor, Rubor, Erythema. Periwound temperature was noted as No Abnormality. The periwound has tenderness on palpation. Assessment Active Problems ICD-10 L89.114 - Pressure ulcer of right upper back, stage 4 L89.154 - Pressure ulcer of sacral region, stage 4 G82.52 - Quadriplegia, C1-C4 incomplete E44.0 - Moderate protein-calorie malnutrition Local dressing with silver alginate will continue on the area for shoulder and appropriately on the sacral wound. Offloading, nutrition and vitamins have also been stressed. She will come back and see as next week and hopefully she'll have a follow-up  appointment to see her plastic surgeons. Plan Wound Cleansing: Wound #1 Right Scapula: Cleanse wound with mild soap and water May Shower, gently pat wound dry prior to applying new dressing. Wound #2 Midline Sacrum: Cleanse wound with mild soap and water May Shower, gently pat wound dry prior to applying new dressing. Primary Wound Dressing: Wound #1 Right Scapula: [name], [name] ([id number]) Aquacel Ag - rope; lightly pack undermining (11-5) Wound #2 Midline Sacrum: Aquacel Ag - rope Secondary Dressing: Wound #1 Right Scapula: Boardered Foam Dressing Wound #2 Midline Sacrum: Boardered Foam Dressing Dressing Change Frequency: Wound #1 Right Scapula: Change dressing every other day. Wound #2 Midline Sacrum: Change dressing every other day. Follow-up Appointments: Wound #1 Right Scapula: Return Appointment in 1 week. Off-Loading: Wound #1 Right Scapula: Turn and reposition every 2 hours Wound #2 Midline Sacrum: Turn and reposition every 2 hours Home Health: Wound #1 Right Scapula: Continue Home Health Visits - [facility] Home Health Nurse may visit PRN to address patient s wound care needs. FACE TO FACE ENCOUNTER: MEDICARE and MEDICAID PATIENTS: I certify that this patient is under my care and that I had a face-to-face encounter that meets the physician face-to-face encounter requirements with this patient on this date. The encounter with the patient was in whole or in part for the following MEDICAL CONDITION: (primary reason for Home Healthcare) MEDICAL NECESSITY: I certify, that based on my findings, NURSING services are a medically necessary home health service. HOME BOUND STATUS: I certify that my clinical findings support that this patient is homebound (i.e., Due to illness or injury, pt requires aid of supportive devices such as crutches, cane, wheelchairs, walkers, the use of special transportation or the assistance of another person to leave their place  of residence. There is a normal inability to leave the home and doing so requires considerable and taxing effort. Other absences are for medical reasons / religious services and are infrequent or of short duration when for other reasons). If current dressing causes regression in wound condition, may D/C ordered dressing product/s and apply Normal Saline Moist Dressing daily until next Wound Healing Center / Other MD appointment. Notify Wound Healing Center of regression in wound condition at [phone]. Please direct any NON-WOUND related issues/requests for orders to patient's Primary Care Physician Wound #2 Midline Sacrum: Continue Home Health Visits - [facility] Home Health Nurse may visit PRN to address patient s wound care needs. FACE TO FACE ENCOUNTER: MEDICARE and MEDICAID PATIENTS: I certify that this patient is under my care and that I had a face-to-face encounter that meets the physician face-to-face  encounter requirements with this patient on this date. The encounter with the patient was in whole or in part for the following MEDICAL CONDITION: (primary reason for Home Healthcare) MEDICAL NECESSITY: I certify, that based on my findings, NURSING services are a medically necessary home health service. HOME BOUND STATUS: I certify that my clinical findings support that this patient is homebound (i.e., Due to illness or injury, pt requires aid of supportive devices such as crutches, cane, wheelchairs, walkers, the use of special transportation or the assistance of another person to leave their place of residence. There is a normal inability to leave the home and doing so requires considerable and taxing effort. Other absences are NABILA, ALBARRACIN ([id number]) for medical reasons / religious services and are infrequent or of short duration when for other reasons). If current dressing causes regression in wound condition, may D/C ordered dressing product/s and apply Normal  Saline Moist Dressing daily until next Wound Healing Center / Other MD appointment. Notify Wound Healing Center of regression in wound condition at [phone]. Please direct any NON-WOUND related issues/requests for orders to patient's Primary Care Physician Local dressing with silver alginate will continue on the area for shoulder and appropriately on the sacral wound. Offloading, nutrition and vitamins have also been stressed. She will come back and see as next week and hopefully she'll have a follow-up appointment to see her plastic surgeons. Electronic Signature(s) Signed: 03/25/2015 10:30:09 AM By: Evlyn Kanner MD, FACS Entered By: Evlyn Kanner on 03/25/2015 10:30:09 [name] ([id number]) -------------------------------------------------------------------------------- SuperBill Details Patient Name: KIMBLE, DELAURENTIS. Date of Service: 03/25/2015 Medical Record Number: [id number] Patient Account Number: [id number] Date of Birth/Sex: Nov 20, 1965 (49 y.o. 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

## 2015-03-29 ENCOUNTER — Encounter: Payer: Self-pay | Admitting: Emergency Medicine

## 2015-03-29 ENCOUNTER — Emergency Department
Admission: EM | Admit: 2015-03-29 | Discharge: 2015-03-29 | Disposition: A | Discharge status: Home / Self Care | Payer: Medicare Other | Attending: Emergency Medicine | Admitting: Emergency Medicine

## 2015-03-29 DIAGNOSIS — N390 Urinary tract infection, site not specified: Secondary | ICD-10-CM | POA: Diagnosis not present

## 2015-03-29 DIAGNOSIS — R51 Headache: Secondary | ICD-10-CM | POA: Diagnosis present

## 2015-03-29 DIAGNOSIS — J069 Acute upper respiratory infection, unspecified: Secondary | ICD-10-CM | POA: Diagnosis not present

## 2015-03-29 LAB — URINALYSIS COMPLETE WITH MICROSCOPIC (ARMC ONLY)
Bilirubin Urine: NEGATIVE
GLUCOSE, UA: NEGATIVE mg/dL
Hgb urine dipstick: NEGATIVE
Leukocytes, UA: NEGATIVE
NITRITE: NEGATIVE
Protein, ur: 100 mg/dL — AB
SPECIFIC GRAVITY, URINE: 1.009 (ref 1.005–1.030)
pH: 9 — ABNORMAL HIGH (ref 5.0–8.0)

## 2015-03-29 MED ORDER — TRAMADOL HCL 50 MG PO TABS: 50.0000 mg | ORAL_TABLET | Freq: Four times a day (QID) | ORAL | Status: DC | PRN

## 2015-03-29 MED ORDER — SULFAMETHOXAZOLE-TRIMETHOPRIM 800-160 MG PO TABS: 1.0000 | ORAL_TABLET | Freq: Two times a day (BID) | ORAL | Status: DC

## 2015-03-29 MED ORDER — PSEUDOEPH-BROMPHEN-DM 30-2-10 MG/5ML PO SYRP: 5.0000 mL | ORAL_SOLUTION | Freq: Four times a day (QID) | ORAL | Status: DC | PRN

## 2015-03-29 NOTE — ED Notes (Signed)
Pt presents with headache, congestion and possible uti since Saturday. No acute distress noted at time of check in.

## 2015-03-29 NOTE — ED Provider Notes (Signed)
[facility] Emergency Department Provider Note  ____________________________________________  Time seen: Approximately 2:20 PM  I have reviewed the triage vital signs and the nursing notes.   HISTORY  Chief Complaint Headache and Urinary Tract Infection    HPI [name] is a 49 y.o. female patient complained of frontal headache, nasal/ chest congestion, and possible UTI. Patient's the onset of symptoms started 2 days ago. Patient denies any fever with this complaint. Patient is rating her pain discomfort as a 10 over 10. No palliative measures taken this complaint. Patient is a quadriplegic.   History reviewed. No pertinent past medical history.  There are no active problems to display for this patient.   History reviewed. No pertinent past surgical history.  Current Outpatient Rx  Name  Route  Sig  Dispense  Refill  . brompheniramine-pseudoephedrine-DM 30-2-10 MG/5ML syrup   Oral   Take 5 mLs by mouth 4 (four) times daily as needed.   120 mL   0   . sulfamethoxazole-trimethoprim (BACTRIM DS,SEPTRA DS) 800-160 MG per tablet   Oral   Take 1 tablet by mouth 2 (two) times daily.   20 tablet   0   . traMADol (ULTRAM) 50 MG tablet   Oral   Take 1 tablet (50 mg total) by mouth every 6 (six) hours as needed for moderate pain.   12 tablet   0     Allergies Shellfish allergy  No family history on file.  Social History Social History  Substance Use Topics  . Smoking status: Never Smoker   . Smokeless tobacco: None  . Alcohol Use: No    Review of Systems Constitutional: No fever/chills Eyes: No visual changes. ENT: No sore throat. Cardiovascular: Denies chest pain. Respiratory: Denies shortness of breath. Productive cough  Gastrointestinal: No abdominal pain.  No nausea, no vomiting.  No diarrhea.  No constipation. Genitourinary: Positive for dysuria. Musculoskeletal: Negative for back pain. Skin: Negative for  rash. Neurological: Positive for headaches. Allergic/Immunilogical: Shellfish  10-point ROS otherwise negative.  ____________________________________________   PHYSICAL EXAM:  VITAL SIGNS: ED Triage Vitals  Enc Vitals Group     BP 03/29/15 1337 111/76 mmHg     Pulse Rate 03/29/15 1337 89     Resp 03/29/15 1337 20     Temp 03/29/15 1337 98.9 F (37.2 C)     Temp Source 03/29/15 1337 Oral     SpO2 03/29/15 1337 95 %     Weight 03/29/15 1337 93 lb (42.185 kg)     Height 03/29/15 1337  (1.6 m)     Head Cir --      Peak Flow --      Pain Score 03/29/15 1338 10     Pain Loc --      Pain Edu? --      Excl. in GC? --     Constitutional: Alert and oriented. Well appearing and in no acute distress. Eyes: Conjunctivae are normal. PERRL. EOMI. Head: Atraumatic. Nose: No congestion/rhinnorhea. Mouth/Throat: Mucous membranes are moist.  Oropharynx non-erythematous. Neck: No stridor.   Hematological/Lymphatic/Immunilogical: No cervical lymphadenopathy. Cardiovascular: Normal rate, regular rhythm. Grossly normal heart sounds.  Good peripheral circulation. Respiratory: Normal respiratory effort.  No retractions. Lungs CTAB. Gastrointestinal: Soft and nontender. No distention. No abdominal bruits. No CVA tenderness. Musculoskeletal: No lower extremity tenderness nor edema.  No joint effusions. Neurologic:  Normal speech and language. No gross focal neurologic deficits are appreciated. No gait instability. Skin:  Skin is warm, dry  and intact. No rash noted. Psychiatric: Mood and affect are normal. Speech and behavior are normal.  ____________________________________________   LABS (all labs ordered are listed, but only abnormal results are displayed)  Labs Reviewed  URINALYSIS COMPLETEWITH MICROSCOPIC ([facility] ONLY) - Abnormal; Notable for the following:    Color, Urine YELLOW (*)    APPearance TURBID (*)    Ketones, ur TRACE (*)    pH 9.0 (*)    Protein, ur 100 (*)     Bacteria, UA MANY (*)    Squamous Epithelial / LPF 0-5 (*)    All other components within normal limits  URINE CULTURE   ____________________________________________  EKG   ____________________________________________  RADIOLOGY   ____________________________________________   PROCEDURES  Procedure(s) performed: None  Critical Care performed: No  ____________________________________________   INITIAL IMPRESSION / ASSESSMENT AND PLAN / ED COURSE  Pertinent labs & imaging results that were available during my care of the patient were reviewed by me and considered in my medical decision making (see chart for details).  Upper respiratory tract infection and urinary tract infection. Discussed lab findings with palpation. Patient was started on Bactrim DS, Bromfed-DM, and Tylenol. Patient advised to follow-up with her family doctor and advised in the urine culture is pending from the ER. ____________________________________________   FINAL CLINICAL IMPRESSION(S) / ED DIAGNOSES  Final diagnoses:  URI (upper respiratory infection)  UTI (lower urinary tract infection)      Joni Reining, PA-C 03/29/15 1435  Emily Filbert, MD 03/29/15 1534

## 2015-03-31 LAB — URINE CULTURE: Special Requests: NORMAL

## 2015-04-01 ENCOUNTER — Ambulatory Visit: Payer: Medicare Other | Admitting: Surgery

## 2015-04-08 ENCOUNTER — Ambulatory Visit: Payer: Medicare Other | Admitting: General Surgery

## 2015-04-22 ENCOUNTER — Encounter: Payer: Medicare Other | Attending: Surgery | Admitting: Surgery

## 2015-04-22 DIAGNOSIS — G8252 Quadriplegia, C1-C4 incomplete: Secondary | ICD-10-CM | POA: Insufficient documentation

## 2015-04-22 DIAGNOSIS — L89154 Pressure ulcer of sacral region, stage 4: Secondary | ICD-10-CM | POA: Insufficient documentation

## 2015-04-22 DIAGNOSIS — L89114 Pressure ulcer of right upper back, stage 4: Secondary | ICD-10-CM | POA: Insufficient documentation

## 2015-04-22 DIAGNOSIS — E440 Moderate protein-calorie malnutrition: Secondary | ICD-10-CM | POA: Diagnosis not present

## 2015-04-23 NOTE — Progress Notes (Signed)
[name], [name] ([id number]) Visit Report for 04/22/2015 Arrival Information Details Patient Name: [name], [name]. Date of Service: 04/22/2015 10:45 AM Medical Record Number: [id number] Patient Account Number: [id number] Date of Birth/Sex: January 14, 1966 (49 y.o. Female) Treating RN: Afful, RN, BSN, [place] Sink Primary Care Physician: [place], JULIE Other Clinician: Referring Physician: [place], JULIE Treating Physician/Extender: Rudene Re in Treatment: 12 Visit Information History Since Last Visit Added or deleted any medications: No Patient Arrived: Wheel Chair Any new allergies or adverse reactions: No Arrival Time: 10:16 Had a fall or experienced change in No activities of daily living that may affect Accompanied By: partner risk of falls: Transfer Assistance: Manual Signs or symptoms of abuse/neglect since last No Patient Identification Verified: Yes visito Secondary Verification Process Yes Hospitalized since last visit: No Completed: Has Dressing in Place as Prescribed: Yes Patient Has Alerts: No Pain Present Now: No Electronic Signature(s) Signed: 04/23/2015 11:50:26 AM By: Elpidio Eric BSN, RN Entered By: Elpidio Eric on 04/22/2015 10:16:57 Stingley, [name] ([id number]) -------------------------------------------------------------------------------- Clinic Level of Care Assessment Details Patient Name: [name] Date of Service: 04/22/2015 10:45 AM Medical Record Number: [id number] Patient Account Number: [id number] Date of Birth/Sex: [birth year] (49 y.o. Female) Treating RN: Afful, RN, BSN, Psychologist, clinical Primary Care Physician: [place], JULIE Other Clinician: Referring Physician: [place], JULIE Treating Physician/Extender: Rudene Re in Treatment: 12 Clinic Level of Care Assessment Items TOOL 4 Quantity Score []  - Use when only an EandM is performed on FOLLOW-UP visit 0 ASSESSMENTS - Nursing Assessment / Reassessment X - Reassessment of Co-morbidities  (includes updates in patient status) 1 10 X - Reassessment of Adherence to Treatment Plan 1 5 ASSESSMENTS - Wound and Skin Assessment / Reassessment []  - Simple Wound Assessment / Reassessment - one wound 0 X - Complex Wound Assessment / Reassessment - multiple wounds 2 5 []  - Dermatologic / Skin Assessment (not related to wound area) 0 ASSESSMENTS - Focused Assessment []  - Circumferential Edema Measurements - multi extremities 0 []  - Nutritional Assessment / Counseling / Intervention 0 []  - Lower Extremity Assessment (monofilament, tuning fork, pulses) 0 []  - Peripheral Arterial Disease Assessment (using hand held doppler) 0 ASSESSMENTS - Ostomy and/or Continence Assessment and Care []  - Incontinence Assessment and Management 0 []  - Ostomy Care Assessment and Management (repouching, etc.) 0 PROCESS - Coordination of Care X - Simple Patient / Family Education for ongoing care 1 15 []  - Complex (extensive) Patient / Family Education for ongoing care 0 []  - Staff obtains Chiropractor, Records, Test Results / Process Orders 0 []  - Staff telephones HHA, Nursing Homes / Clarify orders / etc 0 []  - Routine Transfer to another Facility (non-emergent condition) 0 [name], [name] ([id number]) []  - Routine Hospital Admission (non-emergent condition) 0 []  - New Admissions / Manufacturing engineer / Ordering NPWT, Apligraf, etc. 0 []  - Emergency Hospital Admission (emergent condition) 0 []  - Simple Discharge Coordination 0 []  - Complex (extensive) Discharge Coordination 0 PROCESS - Special Needs []  - Pediatric / Minor Patient Management 0 []  - Isolation Patient Management 0 []  - Hearing / Language / Visual special needs 0 []  - Assessment of Community assistance (transportation, D/C planning, etc.) 0 []  - Additional assistance / Altered mentation 0 []  - Support Surface(s) Assessment (bed, cushion, seat, etc.) 0 INTERVENTIONS - Wound Cleansing / Measurement X - Simple Wound Cleansing - one wound  1 5 []  - Complex Wound Cleansing - multiple wounds 0 X - Wound Imaging (photographs - any number of wounds) 1 5 []  -  Wound Tracing (instead of photographs) 0 X - Simple Wound Measurement - one wound 1 5 []  - Complex Wound Measurement - multiple wounds 0 INTERVENTIONS - Wound Dressings X - Small Wound Dressing one or multiple wounds 1 10 []  - Medium Wound Dressing one or multiple wounds 0 []  - Large Wound Dressing one or multiple wounds 0 []  - Application of Medications - topical 0 []  - Application of Medications - injection 0 INTERVENTIONS - Miscellaneous []  - External ear exam 0 [name], [name] ([id number]) []  - Specimen Collection (cultures, biopsies, blood, body fluids, etc.) 0 []  - Specimen(s) / Culture(s) sent or taken to Lab for analysis 0 []  - Patient Transfer (multiple staff / Michiel SitesHoyer Lift / Similar devices) 0 []  - Simple Staple / Suture removal (25 or less) 0 []  - Complex Staple / Suture removal (26 or more) 0 []  - Hypo / Hyperglycemic Management (close monitor of Blood Glucose) 0 []  - Ankle / Brachial Index (ABI) - do not check if billed separately 0 X - Vital Signs 1 5 Has the patient been seen at the hospital within the last three years: Yes Total Score: 70 Level Of Care: New/Established - Level 2 Electronic Signature(s) Signed: 04/23/2015 11:50:26 AM By: Elpidio EricAfful, Rita BSN, RN Entered By: Elpidio EricAfful, Rita on 04/22/2015 10:36:41 [name], [name] ([id number]) -------------------------------------------------------------------------------- Encounter Discharge Information Details Patient Name: [name], [name] Date of Service: 04/22/2015 10:45 AM Medical Record Number: [id number] Patient Account Number: [id number] Date of Birth/Sex: Apr 24, 1966 (49 y.o. Female) Treating RN: Afful, RN, BSN, [place] Sinkita Primary Care Physician: [place], JULIE Other Clinician: Referring Physician: [place], JULIE Treating Physician/Extender: Rudene ReBritto, [name] Weeks in Treatment: 12 Encounter Discharge  Information Items Discharge Pain Level: 0 Discharge Condition: Stable Ambulatory Status: Wheelchair Discharge Destination: Home Transportation: Other Accompanied By: partner Schedule Follow-up Appointment: No Medication Reconciliation completed and provided to Patient/Care No Tarrin Lebow: Provided on Clinical Summary of Care: 04/22/2015 Form Type Recipient Paper Patient CW Electronic Signature(s) Signed: 04/22/2015 10:40:33 AM By: Gwenlyn PerkingMoore, Shelia Entered By: Gwenlyn PerkingMoore, Shelia on 04/22/2015 10:40:33 Munsch, [name] ([id number]) -------------------------------------------------------------------------------- Lower Extremity Assessment Details Patient Name: [name], [name] Date of Service: 04/22/2015 10:45 AM Medical Record Number: [id number] Patient Account Number: [id number] Date of Birth/Sex: Apr 24, 1966 (49 y.o. Female) Treating RN: Afful, RN, BSN, [place] Sinkita Primary Care Physician: [place], JULIE Other Clinician: Referring Physician: [place], JULIE Treating Physician/Extender: Rudene ReBritto, [name] Weeks in Treatment: 12 Electronic Signature(s) Signed: 04/23/2015 11:50:26 AM By: Elpidio EricAfful, Rita BSN, RN Entered By: Elpidio EricAfful, Rita on 04/22/2015 10:18:05 Cumpton, [name] ([id number]) -------------------------------------------------------------------------------- Multi Wound Chart Details Patient Name: [name], [name] Date of Service: 04/22/2015 10:45 AM Medical Record Number: [id number] Patient Account Number: [id number] Date of Birth/Sex: Apr 24, 1966 (49 y.o. Female) Treating RN: Clover MealyAfful, RN, BSN, [place] Sinkita Primary Care Physician: [place], JULIE Other Clinician: Referring Physician: [place], JULIE Treating Physician/Extender: Rudene ReBritto, [name] Weeks in Treatment: 12 Vital Signs Height(in): 63 Pulse(bpm): 71 Weight(lbs): 100 Blood Pressure 113/67 (mmHg): Body Mass Index(BMI): 18 Temperature(F): 97.7 Respiratory Rate 18 (breaths/min): Photos: [1:No Photos] [2:No Photos] [N/A:N/A] Wound Location:  [1:Right Scapula] [2:Sacrum - Midline] [N/A:N/A] Wounding Event: [1:Pressure Injury] [2:Pressure Injury] [N/A:N/A] Primary Etiology: [1:Pressure Ulcer] [2:Pressure Ulcer] [N/A:N/A] Comorbid History: [1:Asthma, Chronic Obstructive Pulmonary Disease (COPD), Paraplegia] [2:Asthma, Chronic Obstructive Pulmonary Disease (COPD), Paraplegia] [N/A:N/A] Date Acquired: [1:09/21/2014] [2:12/02/2007] [N/A:N/A] Weeks of Treatment: [1:12] [2:12] [N/A:N/A] Wound Status: [1:Open] [2:Open] [N/A:N/A] Measurements L x W x D 1.1x1.5x0.9 [2:0.5x1x0.1] [N/A:N/A] (cm) Area (cm) : [1:1.296] [2:0.393] [N/A:N/A] Volume (cm) : [1:1.166] [2:0.039] [N/A:N/A] % Reduction in Area: [1:-5.80%] [2:47.90%] [N/A:N/A] %  Reduction in Volume: -19.00% [2:48.00%] [N/A:N/A] 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] [N/A:N/A] Exudate Amount: [1:Large] [2:Medium] [N/A:N/A] Exudate Type: [1:Serosanguineous] [2:Serous] [N/A:N/A] Exudate Color: [1:red, brown] [2:amber] [N/A:N/A] Foul Odor After [1:Yes] [2:No] [N/A:N/A] Cleansing: Odor Anticipated Due to No [2:N/A] [N/A:N/A] Product Use: Wound Margin: [1:Flat and Intact] [2:Flat and Intact] [N/A:N/A] Granulation Amount: [1:Large (67-100%)] [2:Large (67-100%)] [N/A:N/A] Granulation Quality: [1:Red, Pink] [2:Pink] [N/A:N/A] Necrotic Amount: [1:None Present (0%)] [2:None Present (0%)] [N/A:N/A] Exposed Structures: [N/A:N/A] Bone: Yes Bone: Yes Fascia: No Fascia: No Fat: No Fat: No Tendon: No Tendon: No Muscle: No Muscle: No Joint: No Joint: No Epithelialization: None Large (67-100%) N/A Periwound Skin Texture: Edema: No Scarring: Yes N/A Excoriation: No Edema: No Induration: No Excoriation: No Callus: No Induration: No Crepitus: No Callus: No Fluctuance: No Crepitus: No Friable: No Fluctuance: No Rash: No Friable: No Scarring: No Rash: No Periwound Skin Moist: Yes Moist: Yes N/A Moisture: Maceration: No Maceration: No Dry/Scaly:  No Dry/Scaly: No Periwound Skin Color: Atrophie Blanche: No Atrophie Blanche: No N/A Cyanosis: No Cyanosis: No Ecchymosis: No Ecchymosis: No Erythema: No Erythema: No Hemosiderin Staining: No Hemosiderin Staining: No Mottled: No Mottled: No Pallor: No Pallor: No Rubor: No Rubor: No Temperature: No Abnormality No Abnormality N/A Tenderness on Yes Yes N/A Palpation: Wound Preparation: Ulcer Cleansing: Ulcer Cleansing: N/A Rinsed/Irrigated with Rinsed/Irrigated with Saline Saline Topical Anesthetic Topical Anesthetic Applied: Other: lidocaine Applied: Other: lidocaine 4% 4% Treatment Notes Electronic Signature(s) Signed: 04/22/2015 10:30:23 AM By: Elpidio Eric BSN, RN Entered By: Elpidio Eric on 04/22/2015 10:30:23 [name] ([id number]) -------------------------------------------------------------------------------- Multi-Disciplinary Care Plan Details Patient Name: [name], [name]. Date of Service: 04/22/2015 10:45 AM Medical Record Number: [id number] Patient Account Number: [id number] Date of Birth/Sex: May 15, 1966 (49 y.o. Female) Treating RN: Afful, RN, BSN, Psychologist, clinical Primary Care Physician: [place], JULIE Other Clinician: Referring Physician: [place], JULIE Treating Physician/Extender: Rudene Re in Treatment: 12 Active Inactive Abuse / Safety / Falls / Self Care Management Nursing Diagnoses: Impaired home maintenance Impaired physical mobility Potential for falls Self care deficit: actual or potential Goals: Patient/caregiver will verbalize understanding of skin care regimen Date Initiated: 01/27/2015 Goal Status: Active Patient/caregiver will verbalize/demonstrate measure taken to improve self care Date Initiated: 01/27/2015 Goal Status: Active Patient/caregiver will verbalize/demonstrate measures taken to improve the patient's personal safety Date Initiated: 01/27/2015 Goal Status: Active Patient/caregiver will verbalize/demonstrate measures  taken to prevent injury and/or falls Date Initiated: 01/27/2015 Goal Status: Active Patient/caregiver will verbalize/demonstrate understanding of what to do in case of emergency Date Initiated: 01/27/2015 Goal Status: Active Interventions: Assess fall risk on admission and as needed Assess: immobility, friction, shearing, incontinence upon admission and as needed Assess impairment of mobility on admission and as needed per policy Assess self care needs on admission and as needed Provide education on basic hygiene Provide education on fall prevention Provide education on personal and home safety Provide education on safe transfers [name], [name] ([id number]) Treatment Activities: Education provided on Basic Hygiene : 02/04/2015 Notes: Orientation to the Wound Care Program Nursing Diagnoses: Knowledge deficit related to the wound healing center program Goals: Patient/caregiver will verbalize understanding of the Wound Healing Center Program Date Initiated: 01/27/2015 Goal Status: Active Interventions: Provide education on orientation to the wound center Notes: Pressure Nursing Diagnoses: Knowledge deficit related to causes and risk factors for pressure ulcer development Knowledge deficit related to management of pressures ulcers Potential for impaired tissue integrity related to pressure, friction, moisture, and shear Goals: Patient will remain free from development of additional pressure ulcers Date Initiated: 01/27/2015 Goal  Status: Active Patient will remain free of pressure ulcers Date Initiated: 01/27/2015 Goal Status: Active Patient/caregiver will verbalize risk factors for pressure ulcer development Date Initiated: 01/27/2015 Goal Status: Active Patient/caregiver will verbalize understanding of pressure ulcer management Date Initiated: 01/27/2015 Goal Status: Active Interventions: Assess: immobility, friction, shearing, incontinence upon admission and as needed Assess  offloading mechanisms upon admission and as needed Assess potential for pressure ulcer upon admission and as needed Provide education on pressure ulcers [name], [name]. ([id number]) Treatment Activities: Patient referred for pressure reduction/relief devices : 04/22/2015 Patient referred for seating evaluation to ensure proper offloading : 04/22/2015 Notes: Wound/Skin Impairment Nursing Diagnoses: Impaired tissue integrity Knowledge deficit related to smoking impact on wound healing Knowledge deficit related to ulceration/compromised skin integrity Goals: Patient/caregiver will verbalize understanding of skin care regimen Date Initiated: 01/27/2015 Goal Status: Active Ulcer/skin breakdown will have a volume reduction of 30% by week 4 Date Initiated: 01/27/2015 Goal Status: Active Ulcer/skin breakdown will have a volume reduction of 50% by week 8 Date Initiated: 01/27/2015 Goal Status: Active Ulcer/skin breakdown will have a volume reduction of 80% by week 12 Date Initiated: 01/27/2015 Goal Status: Active Ulcer/skin breakdown will heal within 14 weeks Date Initiated: 01/27/2015 Goal Status: Active Interventions: Assess ulceration(s) every visit Provide education on ulcer and skin care Notes: Electronic Signature(s) Signed: 04/22/2015 10:30:16 AM By: Elpidio Eric BSN, RN Entered By: Elpidio Eric on 04/22/2015 10:30:15 Florendo, [name] ([id number]) -------------------------------------------------------------------------------- Pain Assessment Details Patient Name: [name] Date of Service: 04/22/2015 10:45 AM Medical Record Number: [id number] Patient Account Number: [id number] Date of Birth/Sex: [birth year] (49 y.o. Female) Treating RN: Clover Mealy, RN, BSN, [place] Sink Primary Care Physician: [place], JULIE Other Clinician: Referring Physician: [place], JULIE Treating Physician/Extender: Rudene Re in Treatment: 12 Active Problems Location of Pain Severity and  Description of Pain Patient Has Paino No Site Locations Pain Management and Medication Current Pain Management: Electronic Signature(s) Signed: 04/23/2015 11:50:26 AM By: Elpidio Eric BSN, RN Entered By: Elpidio Eric on 04/22/2015 10:17:48 Hartley, [name] ([id number]) -------------------------------------------------------------------------------- Patient/Caregiver Education Details Patient Name: [name], [name]. Date of Service: 04/22/2015 10:45 AM Medical Record Number: [id number] Patient Account Number: [id number] Date of Birth/Gender: 1966/06/15 (49 y.o. Female) Treating RN: Afful, RN, BSN,  Sink Primary Care Physician: [place], JULIE Other Clinician: Referring Physician: [place], JULIE Treating Physician/Extender: Rudene Re in Treatment: 12 Education Assessment Education Provided To: Patient Education Topics Provided Basic Hygiene: Methods: Explain/Verbal Responses: State content correctly Pressure: Methods: Explain/Verbal Responses: State content correctly Safety: Methods: Explain/Verbal Responses: State content correctly Welcome To The Wound Care Center: Methods: Explain/Verbal Wound/Skin Impairment: Methods: Explain/Verbal Responses: State content correctly Electronic Signature(s) Signed: 04/23/2015 11:50:26 AM By: Elpidio Eric BSN, RN Entered By: Elpidio Eric on 04/22/2015 10:39:40 Balestrieri, [name] ([id number]) -------------------------------------------------------------------------------- Wound Assessment Details Patient Name: [name] Date of Service: 04/22/2015 10:45 AM Medical Record Number: [id number] Patient Account Number: [id number] Date of Birth/Sex: 10-01-1965 (49 y.o. Female) Treating RN: Afful, RN, BSN, Psychologist, clinical Primary Care Physician: [place], JULIE Other Clinician: Referring Physician: [place], JULIE Treating Physician/Extender: Rudene Re in Treatment: 12 Wound Status Wound Number: 1 Primary Pressure  Ulcer Etiology: Wound Location: Right Scapula Wound Open Wounding Event: Pressure Injury Status: Date Acquired: 09/21/2014 Comorbid Asthma, Chronic Obstructive Weeks Of Treatment: 12 History: Pulmonary Disease (COPD), Clustered Wound: No Paraplegia Wound Measurements Length: (cm) 1.1 Width: (cm) 1.5 Depth: (cm) 0.9 Area: (cm) 1.296 Volume: (cm) 1.166 % Reduction in Area: -5.8% % Reduction in Volume: -19% Epithelialization: None Tunneling: No Undermining: No Wound Description  Classification: Category/Stage IV Wound Margin: Flat and Intact Exudate Amount: Large Exudate Type: Serosanguineous Exudate Color: red, brown Foul Odor After Cleansing: Yes Due to Product Use: No Wound Bed Granulation Amount: Large (67-100%) Exposed Structure Granulation Quality: Red, Pink Fascia Exposed: No Necrotic Amount: None Present (0%) Fat Layer Exposed: No Tendon Exposed: No Muscle Exposed: No Joint Exposed: No Bone Exposed: Yes Periwound Skin Texture Texture Color No Abnormalities Noted: No No Abnormalities Noted: No Callus: No Atrophie Blanche: No Crepitus: No Cyanosis: No Excoriation: No Ecchymosis: No Fluctuance: No Erythema: No [name], [name] ([id number]) Friable: No Hemosiderin Staining: No Induration: No Mottled: No Localized Edema: No Pallor: No Rash: No Rubor: No Scarring: No Temperature / Pain Moisture Temperature: No Abnormality No Abnormalities Noted: No Tenderness on Palpation: Yes Dry / Scaly: No Maceration: No Moist: Yes Wound Preparation Ulcer Cleansing: Rinsed/Irrigated with Saline Topical Anesthetic Applied: Other: lidocaine 4%, Treatment Notes Wound #1 (Right Scapula) 1. Cleansed with: Clean wound with Normal Saline 2. Anesthetic Topical Lidocaine 4% cream to wound bed prior to debridement 3. Peri-wound Care: Skin Prep 4. Dressing Applied: Aquacel Ag 5. Secondary Dressing Applied Bordered Foam Dressing Electronic  Signature(s) Signed: 04/22/2015 10:27:26 AM By: Elpidio Eric BSN, RN Entered By: Elpidio Eric on 04/22/2015 10:27:26 Paone, [name] ([id number]) -------------------------------------------------------------------------------- Wound Assessment Details Patient Name: [name], KOLLE. Date of Service: 04/22/2015 10:45 AM Medical Record Number: [id number] Patient Account Number: [id number] Date of Birth/Sex: 02-Sep-1965 (49 y.o. Female) Treating RN: Afful, RN, BSN, Psychologist, clinical Primary Care Physician: [place], JULIE Other Clinician: Referring Physician: [place], JULIE Treating Physician/Extender: Rudene Re in Treatment: 12 Wound Status Wound Number: 2 Primary Pressure Ulcer Etiology: Wound Location: Sacrum - Midline Wound Open Wounding Event: Pressure Injury Status: Date Acquired: 12/02/2007 Comorbid Asthma, Chronic Obstructive Weeks Of Treatment: 12 History: Pulmonary Disease (COPD), Clustered Wound: No Paraplegia Wound Measurements Length: (cm) 0.5 Width: (cm) 1 Depth: (cm) 0.1 Area: (cm) 0.393 Volume: (cm) 0.039 % Reduction in Area: 47.9% % Reduction in Volume: 48% Epithelialization: Large (67-100%) Tunneling: No Undermining: No Wound Description Classification: Category/Stage IV Wound Margin: Flat and Intact Exudate Amount: Medium Exudate Type: Serous Exudate Color: amber Foul Odor After Cleansing: No Wound Bed Granulation Amount: Large (67-100%) Exposed Structure Granulation Quality: Pink Fascia Exposed: No Necrotic Amount: None Present (0%) Fat Layer Exposed: No Tendon Exposed: No Muscle Exposed: No Joint Exposed: No Bone Exposed: Yes Periwound Skin Texture Texture Color No Abnormalities Noted: No No Abnormalities Noted: No Callus: No Atrophie Blanche: No Crepitus: No Cyanosis: No Excoriation: No Ecchymosis: No Fluctuance: No Erythema: No [name], [name] ([id number]) Friable: No Hemosiderin Staining: No Induration: No Mottled:  No Localized Edema: No Pallor: No Rash: No Rubor: No Scarring: Yes Temperature / Pain Moisture Temperature: No Abnormality No Abnormalities Noted: No Tenderness on Palpation: Yes Dry / Scaly: No Maceration: No Moist: Yes Wound Preparation Ulcer Cleansing: Rinsed/Irrigated with Saline Topical Anesthetic Applied: Other: lidocaine 4%, Treatment Notes Wound #2 (Midline Sacrum) 1. Cleansed with: Clean wound with Normal Saline 2. Anesthetic Topical Lidocaine 4% cream to wound bed prior to debridement 3. Peri-wound Care: Skin Prep 4. Dressing Applied: Aquacel Ag 5. Secondary Dressing Applied Bordered Foam Dressing Electronic Signature(s) Signed: 04/22/2015 10:29:31 AM By: Elpidio Eric BSN, RN Entered By: Elpidio Eric on 04/22/2015 10:29:31 Willden, [name] ([id number]) -------------------------------------------------------------------------------- Vitals Details Patient Name: MILAGRO, BELMARES. Date of Service: 04/22/2015 10:45 AM Medical Record Number: [id number] Patient Account Number: [id number] Date of Birth/Sex: 04-19-1966 (49 y.o. Female) Treating RN: Afful, RN, BSN, [employer]  Care Physician: [place], JULIE Other Clinician: Referring Physician: [place], JULIE Treating Physician/Extender: Rudene Re in Treatment: 12 Vital Signs Time Taken: 10:18 Temperature (F): 97.7 Height (in): 63 Pulse (bpm): 71 Weight (lbs): 100 Respiratory Rate (breaths/min): 18 Body Mass Index (BMI): 17.7 Blood Pressure (mmHg): 113/67 Reference Range: 80 - 120 mg / dl Electronic Signature(s) Signed: 04/23/2015 11:50:26 AM By: Elpidio Eric BSN, RN Entered By: Elpidio Eric on 04/22/2015 10:19:12

## 2015-04-23 NOTE — Progress Notes (Signed)
[name], [name] ([id number]) Visit Report for 04/22/2015 Chief Complaint Document Details Patient Name: [name], [name] 04/22/2015 10:45 Date of Service: AM Medical Record [id number] Number: Patient Account Number: [id number] Oct 05, 1965 (49 y.o. Treating RN: Clover MealyAfful, RN, BSN, [place] Sinkita Date of Birth/Sex: Female) Other Clinician: Primary Care Physician: [place], JULIE Treating Desarae Placide Referring Physician: [place], JULIE Physician/Extender: Weeks in Treatment: 12 Information Obtained from: Patient Chief Complaint Patient presents to the wound care center for a consult due non healing wound. 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 She comes in for a pressure injury to the right scapular region and the sacral region. She has this for several months. Electronic Signature(s) Signed: 04/22/2015 10:34:16 AM By: Evlyn KannerBritto, Messi Twedt MD, FACS Entered By: Evlyn KannerBritto, Remonia Otte on 04/22/2015 10:34:16 [name], [name] ([id number]) -------------------------------------------------------------------------------- HPI Details Patient Name: [name], [name] 04/22/2015 10:45 Date of Service: AM Medical Record [id number] Number: Patient Account Number: [id number] Oct 05, 1965 (49 y.o. 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 For about 4-5 months she's had a decubitus ulcer on the right scapular region and she has a couple of small areas which have opened out in the region of previous scar tissue in the sacral region. He does not have any other significant comorbidities. She  has a good Roho cushion for her wheelchair and she has a specialized mattress for sleep. She has stopped smoking a year ago. 02/11/2015 -- she has not yet got her appointment [facility]. They did a x-ray of the sacral region this afternoon just before coming here. 02/18/2015 -- X-ray of the pelvis -- IMPRESSION:No acute osseous injury of the pelvis. If there is clinical concern regarding osteomyelitis, recommend further evaluation with CT. X-ray of the sacrum and coccyx IMPRESSION:No acute osseous abnormality of the sacrum. If there is further clinical concern regarding osteomyelitis of the sacrum, further evaluation with CT is recommended. 03/11/2015 -- she has yet to obtain the appointment [facility]. She says she will be diligent and follow-up on this. 03/18/2015 -- she has an appointment to see [facility] this coming [facility]. Also has some issues with her air mattress and is working on getting this replaced. 03/25/2015 -- her air mattress is working okay now and she does not yet have a firm appointment to see her plastic surgeons. 04/22/2015 -- he have not seen her for about a month and this was due to the fact that she had bronchitis, pneumothorax and was admitted [facility]. We do not have a plastic surgery opinion as of yet. Electronic Signature(s) Signed: 04/22/2015 10:34:52 AM By: Evlyn KannerBritto, Lidwina Kaner MD, FACS Entered By: Evlyn KannerBritto, Lyann Hagstrom on 04/22/2015 10:34:52 [name], [name] ([id number]) -------------------------------------------------------------------------------- Physical Exam Details Patient Name: [name], [name] 04/22/2015 10:45 Date of Service: AM Medical Record [id number] Number: Patient Account Number: [id number] Oct 05, 1965 (49 y.o. Treating RN: Clover MealyAfful, RN, BSN,  Sinkita Date of Birth/Sex: Female) Other Clinician: Primary Care Physician: [place], JULIE Treating Rei Contee Referring  Physician: [place], JULIE Physician/Extender: Weeks in Treatment: 12 Constitutional . Pulse regular. Respirations normal and unlabored. Afebrile. . Eyes Nonicteric. Reactive to light. Ears, Nose, Mouth,  and Throat Lips, teeth, and gums WNL.Marland Kitchen Moist mucosa without lesions . Neck supple and nontender. No palpable supraclavicular or cervical adenopathy. Normal sized without goiter. Respiratory WNL. No retractions.. Breath sounds WNL, No rubs, rales, rhonchi, or wheeze.. Cardiovascular Heart rhythm and rate regular, no murmur or gallop.. Pedal Pulses WNL. No clubbing, cyanosis or edema. Chest Breasts symmetical and no nipple discharge.. Breast tissue WNL, no masses, lumps, or tenderness.. Lymphatic No adneopathy. No adenopathy. No adenopathy. Musculoskeletal Adexa without tenderness or enlargement.. Digits and nails w/o clubbing, cyanosis, infection, petechiae, ischemia, or inflammatory conditions.. Integumentary (Hair, Skin) No suspicious lesions. No crepitus or fluctuance. No peri-wound warmth or erythema. No masses.Marland Kitchen Psychiatric Judgement and insight Intact.. No evidence of depression, anxiety, or agitation.. Notes The wound on her sacrum has almost completely healed. The right scapular wound has no slough or debris and is very clean and we will continue packing this with silver alginate. Electronic Signature(s) Signed: 04/22/2015 10:35:29 AM By: Evlyn Kanner MD, FACS Entered By: Evlyn Kanner on 04/22/2015 10:35:28 [name] Masters ([id number]) -------------------------------------------------------------------------------- Physician Orders Details Patient Name: [name], [name] 04/22/2015 10:45 Date of Service: AM Medical Record [id number] Number: Patient Account Number: [id number] Jun 09, 1966 (49 y.o. Treating RN: Clover Mealy, RN, BSN, [place] Sink Date of Birth/Sex: Female) Other Clinician: Primary Care Physician: [place], JULIE Treating Yahir Tavano Referring Physician: [place],  JULIE Physician/Extender: Weeks in Treatment: 12 Verbal / Phone Orders: Yes Clinician: Afful, RN, BSN, Rita Read Back and Verified: Yes Diagnosis Coding Wound Cleansing Wound #1 Right Scapula o Cleanse wound with mild soap and water o May Shower, gently pat wound dry prior to applying new dressing. Wound #2 Midline Sacrum o Cleanse wound with mild soap and water o May Shower, gently pat wound dry prior to applying new dressing. Primary Wound Dressing Wound #1 Right Scapula o Aquacel Ag - rope; lightly pack undermining (11-5) Wound #2 Midline Sacrum o Aquacel Ag - rope Secondary Dressing Wound #1 Right Scapula o Boardered Foam Dressing Wound #2 Midline Sacrum o Boardered Foam Dressing Dressing Change Frequency Wound #1 Right Scapula o Change dressing every other day. Wound #2 Midline Sacrum o Change dressing every other day. Follow-up Appointments Wound #1 Right Scapula o Return Appointment in 1 week. BAYLER, NEHRING ([id number]) Off-Loading Wound #1 Right Scapula o Turn and reposition every 2 hours Wound #2 Midline Sacrum o Turn and reposition every 2 hours Home Health Wound #1 Right Scapula o Continue Home Health Visits - [facility] o Home Health Nurse may visit PRN to address patientos wound care needs. o FACE TO FACE ENCOUNTER: MEDICARE and MEDICAID PATIENTS: I certify that this patient is under my care and that I had a face-to-face encounter that meets the physician face-to-face encounter requirements with this patient on this date. The encounter with the patient was in whole or in part for the following MEDICAL CONDITION: (primary reason for Home Healthcare) MEDICAL NECESSITY: I certify, that based on my findings, NURSING services are a medically necessary home health service. HOME BOUND STATUS: I certify that my clinical findings support that this patient is homebound (i.e., Due to illness or injury, pt requires aid  of supportive devices such as crutches, cane, wheelchairs, walkers, the use of special transportation or the assistance of another person to leave their place of residence. There is a normal inability to leave the home and doing so requires considerable and taxing effort. Other absences are for medical reasons / religious services and are infrequent or of short duration when for other  reasons). o If current dressing causes regression in wound condition, may D/C ordered dressing product/s and apply Normal Saline Moist Dressing daily until next Wound Healing Center / Other MD appointment. Notify Wound Healing Center of regression in wound condition at [phone]. o Please direct any NON-WOUND related issues/requests for orders to patient's Primary Care Physician Wound #2 Midline Sacrum o Continue Home Health Visits - [facility] o Home Health Nurse may visit PRN to address patientos wound care needs. o FACE TO FACE ENCOUNTER: MEDICARE and MEDICAID PATIENTS: I certify that this patient is under my care and that I had a face-to-face encounter that meets the physician face-to-face encounter requirements with this patient on this date. The encounter with the patient was in whole or in part for the following MEDICAL CONDITION: (primary reason for Home Healthcare) MEDICAL NECESSITY: I certify, that based on my findings, NURSING services are a medically necessary home health service. HOME BOUND STATUS: I certify that my clinical findings support that this patient is homebound (i.e., Due to illness or injury, pt requires aid of supportive devices such as crutches, cane, wheelchairs, walkers, the use of special transportation or the assistance of another person to leave their place of residence. There is a normal inability to leave the home and doing so requires considerable and taxing effort. Other absences are for medical reasons / religious services and are infrequent or of short  duration when for other reasons). o If current dressing causes regression in wound condition, may D/C ordered dressing product/s and apply Normal Saline Moist Dressing daily until next Wound Healing Center / Other MD appointment. Notify Wound Healing Center of regression in wound condition at [phone]. o Please direct any NON-WOUND related issues/requests for orders to patient's Primary Care Physician [name], [name] ([id number]) Electronic Signature(s) Signed: 04/22/2015 4:31:29 PM By: Evlyn Kanner MD, FACS Signed: 04/23/2015 11:50:26 AM By: Elpidio Eric BSN, RN Entered By: Elpidio Eric on 04/22/2015 10:32:45 [name] Masters ([id number]) -------------------------------------------------------------------------------- Problem List Details Patient Name: LU, PARADISE 04/22/2015 10:45 Date of Service: AM Medical Record [id number] Number: Patient Account Number: [id number] Nov 04, 1965 (49 y.o. 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 Treating RN: Clover Mealy, RN, BSN, [place] Sink Date of Birth/Sex: Female) Other Clinician: Primary Care Physician: [place], JULIE Treating Rollo Farquhar Referring Physician: [place], JULIE Physician/Extender: Weeks in Treatment: 12 Subjective Chief Complaint Information obtained from Patient Patient presents to the wound care center for a consult due non healing wound. 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 She comes in for a pressure injury to the right scapular region and the sacral region. She has this for several months. 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 For about 4-5 months she's had a decubitus ulcer on the right scapular region and she has a couple of small areas which have opened out in the region of previous scar tissue in the sacral region. He does not have any other significant comorbidities. She has a good Roho cushion for her wheelchair and she has a specialized mattress for sleep. She has stopped smoking a year ago. 02/11/2015 -- she has not yet got her appointment with the plastic surgeons at [facility]. They did a x-ray of the sacral region this afternoon just before coming here. 02/18/2015 -- X-ray of the pelvis -- IMPRESSION:No acute osseous injury of the pelvis. If there is clinical concern regarding osteomyelitis, recommend further evaluation with CT. X-ray of the sacrum and coccyx IMPRESSION:No acute osseous abnormality of the sacrum. If there is further clinical concern regarding osteomyelitis of the sacrum, further evaluation with CT is recommended. 03/11/2015 -- she has yet to obtain the appointment with the plastic surgeons at [facility]. She says she will be diligent and follow-up on this. 03/18/2015 -- she has an appointment to see the plastic surgeons at  [facility] this coming Monday. Also has some issues with her air mattress and is working on getting this replaced. 03/25/2015 -- her air mattress is working okay now and she does not yet have a firm appointment to see her plastic surgeons. 04/22/2015 -- he have not seen her for about a month and this was due to the fact that she had bronchitis, pneumothorax and was admitted to [facility]. We do not have a plastic surgery opinion as of yet. [name], [name] ([id number]) Objective Constitutional Pulse regular. Respirations normal and unlabored. Afebrile. Vitals Time Taken: 10:18 AM, Height: 63 in, Weight: 100 lbs, BMI: 17.7, Temperature: 97.7 F, Pulse: 71 bpm, Respiratory Rate: 18 breaths/min, Blood Pressure: 113/67 mmHg. Eyes Nonicteric. Reactive to light. Ears, Nose, Mouth, and Throat Lips, teeth, and gums WNL.Marland Kitchen Moist mucosa without lesions . Neck supple and nontender. No palpable supraclavicular or cervical adenopathy. Normal sized without goiter. Respiratory WNL. No retractions.. Breath sounds WNL, No rubs, rales, rhonchi, or wheeze.. Cardiovascular Heart rhythm and rate regular, no murmur or gallop.. Pedal Pulses WNL. No clubbing, cyanosis or edema. Chest Breasts symmetical and no nipple discharge.. Breast tissue WNL, no masses, lumps, or tenderness.. Lymphatic No adneopathy. No adenopathy. No adenopathy. Musculoskeletal Adexa without tenderness or enlargement.. Digits and nails w/o clubbing, cyanosis, infection, petechiae, ischemia, or inflammatory conditions.Marland Kitchen Psychiatric Judgement and insight Intact.. No evidence of depression, anxiety, or agitation.. General Notes: The wound on her sacrum has almost completely healed. The right scapular wound has no slough or debris and is very clean and we will continue packing this with silver alginate. Integumentary (Hair, Skin) No suspicious lesions. No crepitus or fluctuance. No peri-wound warmth or erythema. No masses.. Wound #1  status is Open. Original cause of wound was Pressure Injury. The wound is located on the Right Scapula. The wound measures 1.1cm length x 1.5cm width x 0.9cm depth; 1.296cm^2 area and 1.166cm^3 volume. There is bone exposed. There is no tunneling or undermining noted. There is a large amount of [name], [name] ([id number]) serosanguineous drainage noted. The wound margin is flat and intact. There is large (67-100%) red, pink granulation within the wound bed. There is no necrotic tissue within the wound bed. The periwound skin appearance exhibited: Moist. The periwound skin appearance did not exhibit: Callus, Crepitus, Excoriation, Fluctuance, Friable, Induration, Localized Edema, Rash, Scarring, Dry/Scaly,  Maceration, Atrophie Blanche, Cyanosis, Ecchymosis, Hemosiderin Staining, Mottled, Pallor, Rubor, Erythema. Periwound temperature was noted as No Abnormality. The periwound has tenderness on palpation. Wound #2 status is Open. Original cause of wound was Pressure Injury. The wound is located on the Midline Sacrum. The wound measures 0.5cm length x 1cm width x 0.1cm depth; 0.393cm^2 area and 0.039cm^3 volume. There is bone exposed. There is no tunneling or undermining noted. There is a medium amount of serous drainage noted. The wound margin is flat and intact. There is large (67-100%) pink granulation within the wound bed. There is no necrotic tissue within the wound bed. The periwound skin appearance exhibited: Scarring, Moist. The periwound skin appearance did not exhibit: Callus, Crepitus, Excoriation, Fluctuance, Friable, Induration, Localized Edema, Rash, Dry/Scaly, Maceration, Atrophie Blanche, Cyanosis, Ecchymosis, Hemosiderin Staining, Mottled, Pallor, Rubor, Erythema. Periwound temperature was noted as No Abnormality. The periwound has tenderness on palpation. Assessment Active Problems ICD-10 L89.114 - Pressure ulcer of right upper back, stage 4 L89.154 - Pressure ulcer of sacral  region, stage 4 G82.52 - Quadriplegia, C1-C4 incomplete E44.0 - Moderate protein-calorie malnutrition We will continue with silver alginate packing lightly to the undermined area of the right shoulder. The sacral wound is almost completely closed. He is urged to see her plastic surgeons at [facility] and will come back to see me regularly. Plan Wound Cleansing: Wound #1 Right Scapula: Cleanse wound with mild soap and water May Shower, gently pat wound dry prior to applying new dressing. Wound #2 Midline Sacrum: Cleanse wound with mild soap and water May Shower, gently pat wound dry prior to applying new dressing. Primary Wound Dressing: [name], MANKA. ([id number]) Wound #1 Right Scapula: Aquacel Ag - rope; lightly pack undermining (11-5) Wound #2 Midline Sacrum: Aquacel Ag - rope Secondary Dressing: Wound #1 Right Scapula: Boardered Foam Dressing Wound #2 Midline Sacrum: Boardered Foam Dressing Dressing Change Frequency: Wound #1 Right Scapula: Change dressing every other day. Wound #2 Midline Sacrum: Change dressing every other day. Follow-up Appointments: Wound #1 Right Scapula: Return Appointment in 1 week. Off-Loading: Wound #1 Right Scapula: Turn and reposition every 2 hours Wound #2 Midline Sacrum: Turn and reposition every 2 hours Home Health: Wound #1 Right Scapula: Continue Home Health Visits - [facility] Home Health Nurse may visit PRN to address patient s wound care needs. FACE TO FACE ENCOUNTER: MEDICARE and MEDICAID PATIENTS: I certify that this patient is under my care and that I had a face-to-face encounter that meets the physician face-to-face encounter requirements with this patient on this date. The encounter with the patient was in whole or in part for the following MEDICAL CONDITION: (primary reason for Home Healthcare) MEDICAL NECESSITY: I certify, that based on my findings, NURSING services are a medically necessary home health  service. HOME BOUND STATUS: I certify that my clinical findings support that this patient is homebound (i.e., Due to illness or injury, pt requires aid of supportive devices such as crutches, cane, wheelchairs, walkers, the use of special transportation or the assistance of another person to leave their place of residence. There is a normal inability to leave the home and doing so requires considerable and taxing effort. Other absences are for medical reasons / religious services and are infrequent or of short duration when for other reasons). If current dressing causes regression in wound condition, may D/C ordered dressing product/s and apply Normal Saline Moist Dressing daily until next Wound Healing Center / Other MD appointment. Notify Wound Healing Center of regression in  wound condition at [phone]. Please direct any NON-WOUND related issues/requests for orders to patient's Primary Care Physician Wound #2 Midline Sacrum: Continue Home Health Visits - [facility] Home Health Nurse may visit PRN to address patient s wound care needs. FACE TO FACE ENCOUNTER: MEDICARE and MEDICAID PATIENTS: I certify that this patient is under my care and that I had a face-to-face encounter that meets the physician face-to-face encounter requirements with this patient on this date. The encounter with the patient was in whole or in part for the following MEDICAL CONDITION: (primary reason for Home Healthcare) MEDICAL NECESSITY: I certify, that based on my findings, NURSING services are a medically necessary home health service. HOME BOUND STATUS: I certify that my clinical findings support that this patient is homebound (i.e., Due to illness or injury, pt requires aid of supportive devices such as crutches, cane, wheelchairs, walkers, the use of special transportation or the assistance of another person to leave their place of residence. There is a [name], BRUTUS. ([id number]) normal inability  to leave the home and doing so requires considerable and taxing effort. Other absences are for medical reasons / religious services and are infrequent or of short duration when for other reasons). If current dressing causes regression in wound condition, may D/C ordered dressing product/s and apply Normal Saline Moist Dressing daily until next Wound Healing Center / Other MD appointment. Notify Wound Healing Center of regression in wound condition at [phone]. Please direct any NON-WOUND related issues/requests for orders to patient's Primary Care Physician We will continue with silver alginate packing lightly to the undermined area of the right shoulder. The sacral wound is almost completely closed. He is urged to see her plastic surgeons at [facility] and will come back to see me regularly. Electronic Signature(s) Signed: 04/22/2015 10:36:14 AM By: Evlyn Kanner MD, FACS Entered By: Evlyn Kanner on 04/22/2015 10:36:14 Lamia, [name] ([id number]) -------------------------------------------------------------------------------- SuperBill Details Patient Name: SAHER, DAVEE. Date of Service: 04/22/2015 Medical Record Patient Account Number: [id number] Number: Afful, RN, BSN, Treating RN: Feb 22, 1966 (49 y.o. 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, C1-C4 incomplete E44.0 Moderate protein-calorie malnutrition Facility Procedures CPT4 Code: [id number] Description: 64403 - WOUND CARE VISIT-LEV 2 EST PT Modifier: Quantity: 1 Physician Procedures CPT4 Code: [id number] Description: 99213 - WC PHYS LEVEL 3 - EST PT ICD-10 Description Diagnosis L89.114  Pressure ulcer of right upper back, stage 4 L89.154 Pressure ulcer of sacral region, 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

## 2015-05-06 ENCOUNTER — Encounter: Payer: Medicare Other | Attending: Surgery | Admitting: Surgery

## 2015-05-06 DIAGNOSIS — Z87891 Personal history of nicotine dependence: Secondary | ICD-10-CM | POA: Diagnosis not present

## 2015-05-06 DIAGNOSIS — E440 Moderate protein-calorie malnutrition: Secondary | ICD-10-CM | POA: Insufficient documentation

## 2015-05-06 DIAGNOSIS — G8252 Quadriplegia, C1-C4 incomplete: Secondary | ICD-10-CM | POA: Diagnosis not present

## 2015-05-06 DIAGNOSIS — L89114 Pressure ulcer of right upper back, stage 4: Secondary | ICD-10-CM | POA: Diagnosis present

## 2015-05-06 DIAGNOSIS — L89154 Pressure ulcer of sacral region, stage 4: Secondary | ICD-10-CM | POA: Diagnosis not present

## 2015-05-07 NOTE — Progress Notes (Signed)
YITTA, GONGAWARE ([id number]) Visit Report for 05/06/2015 Arrival Information Details Patient Name: [name], [name] Date of Service: 05/06/2015 12:45 PM Medical Record Number: [id number] Patient Account Number: [id number] Date of Birth/Sex: 13-Mar-1966 (49 y.o. Female) Treating RN: Curtis Sites Primary Care Physician: [place], JULIE Other Clinician: Referring Physician: [place], JULIE Treating Physician/Extender: Rudene Re in Treatment: 14 Visit Information History Since Last Visit Added or deleted any medications: No Patient Arrived: Wheel Chair Any new allergies or adverse reactions: No Arrival Time: 12:50 Had a fall or experienced change in No activities of daily living that may affect Accompanied By: spouse risk of falls: Transfer Assistance: Manual Signs or symptoms of abuse/neglect since last No Patient Identification Verified: Yes visito Secondary Verification Process Yes Hospitalized since last visit: No Completed: Pain Present Now: No Patient Has Alerts: No Electronic Signature(s) Signed: 05/06/2015 5:32:28 PM By: Curtis Sites Entered By: Curtis Sites on 05/06/2015 12:58:45 Rothery, [name] ([id number]) -------------------------------------------------------------------------------- Clinic Level of Care Assessment Details Patient Name: [name] Date of Service: 05/06/2015 12:45 PM Medical Record Number: [id number] Patient Account Number: [id number] Date of Birth/Sex: 1965-08-30 (49 y.o. Female) Treating RN: Curtis Sites Primary Care Physician: [place], JULIE Other Clinician: Referring Physician: [place], JULIE Treating Physician/Extender: Rudene Re in Treatment: 14 Clinic Level of Care Assessment Items TOOL 4 Quantity Score []  - Use when only an EandM is performed on FOLLOW-UP visit 0 ASSESSMENTS - Nursing Assessment / Reassessment X - Reassessment of Co-morbidities (includes updates in patient status) 1 10 X - Reassessment of  Adherence to Treatment Plan 1 5 ASSESSMENTS - Wound and Skin Assessment / Reassessment []  - Simple Wound Assessment / Reassessment - one wound 0 X - Complex Wound Assessment / Reassessment - multiple wounds 2 5 []  - Dermatologic / Skin Assessment (not related to wound area) 0 ASSESSMENTS - Focused Assessment []  - Circumferential Edema Measurements - multi extremities 0 []  - Nutritional Assessment / Counseling / Intervention 0 []  - Lower Extremity Assessment (monofilament, tuning fork, pulses) 0 []  - Peripheral Arterial Disease Assessment (using hand held doppler) 0 ASSESSMENTS - Ostomy and/or Continence Assessment and Care []  - Incontinence Assessment and Management 0 []  - Ostomy Care Assessment and Management (repouching, etc.) 0 PROCESS - Coordination of Care X - Simple Patient / Family Education for ongoing care 1 15 []  - Complex (extensive) Patient / Family Education for ongoing care 0 []  - Staff obtains Chiropractor, Records, Test Results / Process Orders 0 []  - Staff telephones HHA, Nursing Homes / Clarify orders / etc 0 []  - Routine Transfer to another Facility (non-emergent condition) 0 Uriarte, Anadia M. ([id number]) []  - Routine Hospital Admission (non-emergent condition) 0 []  - New Admissions / Manufacturing engineer / Ordering NPWT, Apligraf, etc. 0 []  - Emergency Hospital Admission (emergent condition) 0 X - Simple Discharge Coordination 1 10 []  - Complex (extensive) Discharge Coordination 0 PROCESS - Special Needs []  - Pediatric / Minor Patient Management 0 []  - Isolation Patient Management 0 []  - Hearing / Language / Visual special needs 0 []  - Assessment of Community assistance (transportation, D/C planning, etc.) 0 []  - Additional assistance / Altered mentation 0 []  - Support Surface(s) Assessment (bed, cushion, seat, etc.) 0 INTERVENTIONS - Wound Cleansing / Measurement []  - Simple Wound Cleansing - one wound 0 X - Complex Wound Cleansing - multiple wounds 2 5 X -  Wound Imaging (photographs - any number of wounds) 1 5 []  - Wound Tracing (instead of photographs) 0 []  - Simple Wound Measurement -  one wound 0 X - Complex Wound Measurement - multiple wounds 2 5 INTERVENTIONS - Wound Dressings X - Small Wound Dressing one or multiple wounds 2 10  - Medium Wound Dressing one or multiple wounds 0  - Large Wound Dressing one or multiple wounds 0  - Application of Medications - topical 0  - Application of Medications - injection 0 INTERVENTIONS - Miscellaneous  - External ear exam 0 Muns, [place] M. ([id number])  - Specimen Collection (cultures, biopsies, blood, body fluids, etc.) 0  - Specimen(s) / Culture(s) sent or taken to Lab for analysis 0  - Patient Transfer (multiple staff / Michiel Sites Lift / Similar devices) 0  - Simple Staple / Suture removal (25 or less) 0  - Complex Staple / Suture removal (26 or more) 0  - Hypo / Hyperglycemic Management (close monitor of Blood Glucose) 0  - Ankle / Brachial Index (ABI) - do not check if billed separately 0 X - Vital Signs 1 5 Has the patient been seen at the hospital within the last three years: Yes Total Score: 100 Level Of Care: New/Established - Level 3 Electronic Signature(s) Signed: 05/06/2015 5:32:28 PM By: Curtis Sites Entered By: Curtis Sites on 05/06/2015 13:27:05 Towers, [name] ([id number]) -------------------------------------------------------------------------------- Encounter Discharge Information Details Patient Name: CORAZON, NICKOLAS. Date of Service: 05/06/2015 12:45 PM Medical Record Number: [id number] Patient Account Number: [id number] Date of Birth/Sex: 07-07-1965 (49 y.o. Female) Treating RN: Curtis Sites Primary Care Physician: [place], JULIE Other Clinician: Referring Physician: [place], JULIE Treating Physician/Extender: Rudene Re in Treatment: 14 Encounter Discharge Information Items Discharge Pain Level: 0 Discharge Condition:  Stable Ambulatory Status: Wheelchair Discharge Destination: Home Private Transportation: Auto Accompanied By: spouse Schedule Follow-up Appointment: Yes Medication Reconciliation completed and No provided to Patient/Care Daissy Yerian: Clinical Summary of Care: Electronic Signature(s) Signed: 05/06/2015 5:32:28 PM By: Curtis Sites Entered By: Curtis Sites on 05/06/2015 13:30:36 Baley, [name] ([id number]) -------------------------------------------------------------------------------- Multi Wound Chart Details Patient Name: [name] Date of Service: 05/06/2015 12:45 PM Medical Record Number: [id number] Patient Account Number: [id number] Date of Birth/Sex: [birth year] (49 y.o. Female) Treating RN: Curtis Sites Primary Care Physician: [place], JULIE Other Clinician: Referring Physician: [place], JULIE Treating Physician/Extender: Rudene Re in Treatment: 14 Vital Signs Height(in): 63 Pulse(bpm): 68 Weight(lbs): 100 Blood Pressure 107/69 (mmHg): Body Mass Index(BMI): 18 Temperature(F): 98.0 Respiratory Rate 16 (breaths/min): Photos: [1:No Photos] [2:No Photos] [N/A:N/A] Wound Location: [1:Right Scapula] [2:Sacrum - Midline] [N/A:N/A] Wounding Event: [1:Pressure Injury] [2:Pressure Injury] [N/A:N/A] Primary Etiology: [1:Pressure Ulcer] [2:Pressure Ulcer] [N/A:N/A] Comorbid History: [1:Asthma, Chronic Obstructive Pulmonary Disease (COPD), Paraplegia] [2:Asthma, Chronic Obstructive Pulmonary Disease (COPD), Paraplegia] [N/A:N/A] Date Acquired: [1:09/21/2014] [2:12/02/2007] [N/A:N/A] Weeks of Treatment: [1:14] [2:14] [N/A:N/A] Wound Status: [1:Open] [2:Open] [N/A:N/A] Measurements L x W x D 1.4x0.9x0.7 [2:0.5x2x0.1] [N/A:N/A] (cm) Area (cm) : [1:0.99] [2:0.785] [N/A:N/A] Volume (cm) : [1:0.693] [2:0.079] [N/A:N/A] % Reduction in Area: [1:19.20%] [2:-4.10%] [N/A:N/A] % Reduction in Volume: 29.30% [2:-5.30%] [N/A:N/A] Starting Position 1  11 (o'clock): Ending Position 1 [1:5] (o'clock): Maximum Distance 1 1.7 (cm): Undermining: [1:Yes] [2:No] [N/A:N/A] 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 ([id number]) Odor Anticipated Due to No N/A N/A Product Use: Wound Margin: Flat and Intact Flat and Intact N/A Granulation Amount: Large (67-100%) Large (67-100%) N/A Granulation Quality: Red, Pink Pink N/A Necrotic Amount: None Present (0%) None Present (0%) N/A Exposed Structures: Bone: Yes Bone: Yes  N/A Fascia: No Fascia: No Fat: No Fat: No Tendon: No Tendon: No Muscle: No Muscle: No Joint: No Joint: No Epithelialization: None Large (67-100%) N/A Periwound Skin Texture: Edema: No Scarring: Yes N/A Excoriation: No Edema: No Induration: No Excoriation: No Callus: No Induration: No Crepitus: No Callus: No Fluctuance: No Crepitus: No Friable: No Fluctuance: No Rash: No Friable: No Scarring: No Rash: No Periwound Skin Moist: Yes Moist: Yes N/A Moisture: Maceration: No Maceration: No Dry/Scaly: No Dry/Scaly: No Periwound Skin Color: Atrophie Blanche: No Atrophie Blanche: No N/A Cyanosis: No Cyanosis: No Ecchymosis: No Ecchymosis: No Erythema: No Erythema: No Hemosiderin Staining: No Hemosiderin Staining: No Mottled: No Mottled: No Pallor: No Pallor: No Rubor: No Rubor: No Temperature: No Abnormality No Abnormality N/A Tenderness on Yes Yes N/A Palpation: Wound Preparation: Ulcer Cleansing: Ulcer Cleansing: N/A Rinsed/Irrigated with Rinsed/Irrigated with Saline Saline Topical Anesthetic Topical Anesthetic Applied: None Applied: None Treatment Notes Electronic Signature(s) Signed: 05/06/2015 5:32:28 PM By: Curtis Sitesorthy, Joanna Entered By:  Curtis Sitesorthy, Joanna on 05/06/2015 13:10:57 Geddis, [name] ([id number]) Agent, [name] ([id number]) -------------------------------------------------------------------------------- Multi-Disciplinary Care Plan Details Patient Name: [name] MastersWADE, Mairlyn M. Date of Service: 05/06/2015 12:45 PM Medical Record Number: [id number] Patient Account Number: [id number] Date of Birth/Sex: 1966-01-17 (49 y.o. Female) Treating RN: Curtis Sitesorthy, Joanna Primary Care Physician: [place], JULIE Other Clinician: Referring Physician: [place], JULIE Treating Physician/Extender: Rudene ReBritto, [name] Weeks in Treatment: 14 Active Inactive Abuse / Safety / Falls / Self Care Management Nursing Diagnoses: Impaired home maintenance Impaired physical mobility Potential for falls Self care deficit: actual or potential Goals: Patient/caregiver will verbalize understanding of skin care regimen Date Initiated: 01/27/2015 Goal Status: Active Patient/caregiver will verbalize/demonstrate measure taken to improve self care Date Initiated: 01/27/2015 Goal Status: Active Patient/caregiver will verbalize/demonstrate measures taken to improve the patient's personal safety Date Initiated: 01/27/2015 Goal Status: Active Patient/caregiver will verbalize/demonstrate measures taken to prevent injury and/or falls Date Initiated: 01/27/2015 Goal Status: Active Patient/caregiver will verbalize/demonstrate understanding of what to do in case of emergency Date Initiated: 01/27/2015 Goal Status: Active Interventions: Assess fall risk on admission and as needed Assess: immobility, friction, shearing, incontinence upon admission and as needed Assess impairment of mobility on admission and as needed per policy Assess self care needs on admission and as needed Provide education on basic hygiene Provide education on fall prevention Provide education on personal and home safety Provide education on safe transfers [name] MastersWADE, Courtney M.  ([id number]) Treatment Activities: Education provided on Basic Hygiene : 02/04/2015 Notes: Orientation to the Wound Care Program Nursing Diagnoses: Knowledge deficit related to the wound healing center program Goals: Patient/caregiver will verbalize understanding of the Wound Healing Center Program Date Initiated: 01/27/2015 Goal Status: Active Interventions: Provide education on orientation to the wound center Notes: Pressure Nursing Diagnoses: Knowledge deficit related to causes and risk factors for pressure ulcer development Knowledge deficit related to management of pressures ulcers Potential for impaired tissue integrity related to pressure, friction, moisture, and shear Goals: Patient will remain free from development of additional pressure ulcers Date Initiated: 01/27/2015 Goal Status: Active Patient will remain free of pressure ulcers Date Initiated: 01/27/2015 Goal Status: Active Patient/caregiver will verbalize risk factors for pressure ulcer development Date Initiated: 01/27/2015 Goal Status: Active Patient/caregiver will verbalize understanding of pressure ulcer management Date Initiated: 01/27/2015 Goal Status: Active Interventions: Assess: immobility, friction, shearing, incontinence upon admission and as needed Assess offloading mechanisms upon admission and as needed Assess potential for pressure ulcer upon admission and as needed Provide education on pressure ulcers [name] MastersWADE, Hugh M. ([id number]) Treatment Activities: Patient  referred for pressure reduction/relief devices : 05/06/2015 Patient referred for seating evaluation to ensure proper offloading : 05/06/2015 Notes: Wound/Skin Impairment Nursing Diagnoses: Impaired tissue integrity Knowledge deficit related to smoking impact on wound healing Knowledge deficit related to ulceration/compromised skin integrity Goals: Patient/caregiver will verbalize understanding of skin care regimen Date Initiated:  01/27/2015 Goal Status: Active Ulcer/skin breakdown will have a volume reduction of 30% by week 4 Date Initiated: 01/27/2015 Goal Status: Active Ulcer/skin breakdown will have a volume reduction of 50% by week 8 Date Initiated: 01/27/2015 Goal Status: Active Ulcer/skin breakdown will have a volume reduction of 80% by week 12 Date Initiated: 01/27/2015 Goal Status: Active Ulcer/skin breakdown will heal within 14 weeks Date Initiated: 01/27/2015 Goal Status: Active Interventions: Assess ulceration(s) every visit Provide education on ulcer and skin care Notes: Electronic Signature(s) Signed: 05/06/2015 5:32:28 PM By: Curtis Sites Entered By: Curtis Sites on 05/06/2015 13:10:50 Morozov, [name] ([id number]) -------------------------------------------------------------------------------- Patient/Caregiver Education Details Patient Name: [name] Date of Service: 05/06/2015 12:45 PM Medical Record Number: [id number] Patient Account Number: [id number] Date of Birth/Gender: 23-Sep-1965 (49 y.o. Female) Treating RN: Curtis Sites Primary Care Physician: [place], JULIE Other Clinician: Referring Physician: [place], JULIE Treating Physician/Extender: Rudene Re in Treatment: 14 Education Assessment Education Provided To: Patient and Caregiver Education Topics Provided Wound/Skin Impairment: Handouts: Other: new wound care as ordered Methods: Demonstration, Explain/Verbal Responses: State content correctly Electronic Signature(s) Signed: 05/06/2015 5:32:28 PM By: Curtis Sites Entered By: Curtis Sites on 05/06/2015 13:30:54 [place], [name] ([id number]) -------------------------------------------------------------------------------- Wound Assessment Details Patient Name: [name] Date of Service: 05/06/2015 12:45 PM Medical Record Number: [id number] Patient Account Number: [id number] Date of Birth/Sex: [birth year] (49 y.o. Female) Treating RN: Curtis Sites Primary Care Physician: [place], JULIE Other Clinician: Referring Physician: [place], JULIE Treating Physician/Extender: Rudene Re in Treatment: 14 Wound Status Wound Number: 1 Primary Pressure Ulcer Etiology: Wound Location: Right Scapula Wound Open Wounding Event: Pressure Injury Status: Date Acquired: 09/21/2014 Comorbid Asthma, Chronic Obstructive Weeks Of Treatment: 14 History: Pulmonary Disease (COPD), Clustered Wound: No Paraplegia Photos Photo Uploaded By: Curtis Sites on 05/06/2015 14:42:02 Wound Measurements Length: (cm) 1.4 Width: (cm) 0.9 Depth: (cm) 0.7 Area: (cm) 0.99 Volume: (cm) 0.693 % Reduction in Area: 19.2% % Reduction in Volume: 29.3% Epithelialization: None Tunneling: No Undermining: Yes Starting Position (o'clock): 11 Ending Position (o'clock): 5 Maximum Distance: (cm) 1.7 Wound Description Classification: Category/Stage IV Wound Margin: Flat and Intact Exudate Amount: Large Exudate Type: Serosanguineous Exudate Color: red, brown Foul Odor After Cleansing: Yes Due to Product Use: No Wound Bed Behring, Kaylanie M. ([id number]) Granulation Amount: Large (67-100%) Exposed Structure Granulation Quality: Red, Pink Fascia Exposed: No Necrotic Amount: None Present (0%) Fat Layer Exposed: No Tendon Exposed: No Muscle Exposed: No Joint Exposed: No Bone Exposed: Yes Periwound Skin Texture Texture Color No Abnormalities Noted: No No Abnormalities Noted: No Callus: No Atrophie Blanche: No Crepitus: No Cyanosis: No Excoriation: No Ecchymosis: No Fluctuance: No Erythema: No Friable: No Hemosiderin Staining: No Induration: No Mottled: No Localized Edema: No Pallor: No Rash: No Rubor: No Scarring: No Temperature / Pain Moisture Temperature: No Abnormality No Abnormalities Noted: No Tenderness on Palpation: Yes Dry / Scaly: No Maceration: No Moist: Yes Wound Preparation Ulcer Cleansing: Rinsed/Irrigated with  Saline Topical Anesthetic Applied: None Treatment Notes Wound #1 (Right Scapula) 1. Cleansed with: Clean wound with Normal Saline 2. Anesthetic Topical Lidocaine 4% cream to wound bed prior to debridement 3. Peri-wound Care: Skin Prep 4. Dressing Applied: Other dressing (specify in notes) 5.  Secondary Dressing Applied Bordered Foam Dressing Notes sorbact Electronic Signature(s) COLLIN, HENDLEY ([id number]) Signed: 05/06/2015 5:32:28 PM By: Curtis Sites Entered By: Curtis Sites on 05/06/2015 13:08:02 Bahena, [name] ([id number]) -------------------------------------------------------------------------------- Wound Assessment Details Patient Name: PIETRA, ZULUAGA. Date of Service: 05/06/2015 12:45 PM Medical Record Number: [id number] Patient Account Number: [id number] Date of Birth/Sex: [birth year] (49 y.o. Female) Treating RN: Curtis Sites Primary Care Physician: [place], JULIE Other Clinician: Referring Physician: [place], JULIE Treating Physician/Extender: Rudene Re in Treatment: 14 Wound Status Wound Number: 2 Primary Pressure Ulcer Etiology: Wound Location: Sacrum - Midline Wound Open Wounding Event: Pressure Injury Status: Date Acquired: 12/02/2007 Comorbid Asthma, Chronic Obstructive Weeks Of Treatment: 14 History: Pulmonary Disease (COPD), Clustered Wound: No Paraplegia Photos Photo Uploaded By: Curtis Sites on 05/06/2015 14:42:02 Wound Measurements Length: (cm) 0.5 Width: (cm) 2 Depth: (cm) 0.1 Area: (cm) 0.785 Volume: (cm) 0.079 % Reduction in Area: -4.1% % Reduction in Volume: -5.3% Epithelialization: Large (67-100%) Tunneling: No Undermining: No Wound Description Classification: Category/Stage IV Wound Margin: Flat and Intact Exudate Amount: Medium Exudate Type: Serous Exudate Color: amber Foul Odor After Cleansing: No Wound Bed Granulation Amount: Large (67-100%) Exposed Structure Granulation Quality: Pink Fascia  Exposed: No Necrotic Amount: None Present (0%) Fat Layer Exposed: No Tendon Exposed: No Rugg, Kelsie M. ([id number]) Muscle Exposed: No Joint Exposed: No Bone Exposed: Yes Periwound Skin Texture Texture Color No Abnormalities Noted: No No Abnormalities Noted: No Callus: No Atrophie Blanche: No Crepitus: No Cyanosis: No Excoriation: No Ecchymosis: No Fluctuance: No Erythema: No Friable: No Hemosiderin Staining: No Induration: No Mottled: No Localized Edema: No Pallor: No Rash: No Rubor: No Scarring: Yes Temperature / Pain Moisture Temperature: No Abnormality No Abnormalities Noted: No Tenderness on Palpation: Yes Dry / Scaly: No Maceration: No Moist: Yes Wound Preparation Ulcer Cleansing: Rinsed/Irrigated with Saline Topical Anesthetic Applied: None Treatment Notes Wound #2 (Midline Sacrum) 1. Cleansed with: Clean wound with Normal Saline 3. Peri-wound Care: Skin Prep 4. Dressing Applied: Aquacel Ag 5. Secondary Dressing Applied Bordered Foam Dressing Electronic Signature(s) Signed: 05/06/2015 5:32:28 PM By: Curtis Sites Entered By: Curtis Sites on 05/06/2015 13:10:42 Trinkle, [name] ([id number]) -------------------------------------------------------------------------------- Vitals Details Patient Name: DAVEY, BERGSMA. Date of Service: 05/06/2015 12:45 PM Medical Record Number: [id number] Patient Account Number: [id number] Date of Birth/Sex: Nov 20, 1965 (49 y.o. Female) Treating RN: Curtis Sites Primary Care Physician: [place], JULIE Other Clinician: Referring Physician: [place], JULIE Treating Physician/Extender: Rudene Re in Treatment: 14 Vital Signs Time Taken: 13:00 Temperature (F): 98.0 Height (in): 63 Pulse (bpm): 68 Weight (lbs): 100 Respiratory Rate (breaths/min): 16 Body Mass Index (BMI): 17.7 Blood Pressure (mmHg): 107/69 Reference Range: 80 - 120 mg / dl Electronic Signature(s) Signed: 05/06/2015 5:32:28 PM By:  Curtis Sites Entered By: Curtis Sites on 05/06/2015 13:01:24

## 2015-05-07 NOTE — Progress Notes (Signed)
[name] ([id number]) Visit Report for 05/06/2015 Chief Complaint Document Details Patient Name: [name], [name] 05/06/2015 12:45 Date of Service: PM Medical Record [id number] Number: Patient Account Number: [id number] Dec 09, 1965 (49 y.o. Treating RN: Curtis Sites Date of Birth/Sex: Female) Other Clinician: Primary Care Physician: [place], [name] Treating Evlyn Kanner Referring Physician: [place], [name] Physician/Extender: Weeks in Treatment: 14 Information Obtained from: Patient Chief Complaint Patient presents to the wound care center for a consult due non healing wound. 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 She comes in for a pressure injury to the right scapular region and the sacral region. She has this for several months. Electronic Signature(s) Signed: 05/06/2015 1:21:39 PM By: Evlyn Kanner MD, FACS Entered By: Evlyn Kanner on 05/06/2015 13:21:39 ZISSEL, BIEDERMAN ([id number]) -------------------------------------------------------------------------------- HPI Details Patient Name: [name], [name] 05/06/2015 12:45 Date of Service: PM Medical Record [id number] Number: Patient Account Number: [id number] [birth year] (49 y.o. 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 For about 4-5 months she's had a decubitus ulcer on the right scapular region and she has a couple of small areas which have opened out in the region of previous scar tissue in the sacral region. He does not have any other significant comorbidities. She has a good Roho  cushion for her wheelchair and she has a specialized mattress for sleep. She has stopped smoking a year ago. 02/11/2015 -- she has not yet got her appointment with the plastic surgeons at [facility]. They did a x-ray of the sacral region this afternoon just before coming here. 02/18/2015 -- X-ray of the pelvis -- IMPRESSION:No acute osseous injury of the pelvis. If there is clinical concern regarding osteomyelitis, recommend further evaluation with CT. X-ray of the sacrum and coccyx IMPRESSION:No acute osseous abnormality of the sacrum. If there is further clinical concern regarding osteomyelitis of the sacrum, further evaluation with CT is recommended. 03/11/2015 -- she has yet to obtain the appointment with the plastic surgeons at [facility]. She says she will be diligent and follow-up on this. 03/18/2015 -- she has an appointment to see the plastic surgeons at [facility] this coming Monday. Also has some issues with her air mattress and is working on getting this replaced. 03/25/2015 -- her air mattress is working okay now and she does not yet have a firm appointment to see her plastic surgeons. 04/22/2015 -- he have not seen her for about a month and this was due to the fact that she had bronchitis, pneumothorax and was admitted to [facility]. We do not have a plastic surgery opinion as of yet. Electronic Signature(s) Signed: 05/06/2015 1:21:48 PM By: Evlyn Kanner MD, FACS Entered By: Evlyn Kanner on 05/06/2015 13:21:48 [name] ([id number]) -------------------------------------------------------------------------------- Physical Exam Details Patient Name: [name], [name] 05/06/2015 12:45 Date of Service: PM Medical Record [id number] Number: Patient Account Number: [id number] 01/20/1966 (49 y.o. Treating RN: Curtis Sites Date of Birth/Sex: Female) Other Clinician: Primary Care Physician: [place], [name] Treating Evlyn Kanner Referring Physician: [place],  [name] Physician/Extender: Weeks in Treatment: 14 Constitutional . Pulse regular. Respirations normal and unlabored. Afebrile. . Eyes Nonicteric. Reactive to light. Ears, Nose, Mouth, and Throat Lips, teeth, and gums  WNL.. Moist mucosa without lesions . Neck supple and nontender. No palpable supraclavicular or cervical adenopathy. Normal sized without goiter. Respiratory WNL. No retractions.. Cardiovascular Pedal Pulses WNL. No clubbing, cyanosis or edema. Chest Breasts symmetical and no nipple discharge.. Breast tissue WNL, no masses, lumps, or tenderness.. Lymphatic No adneopathy. No adenopathy. No adenopathy. Musculoskeletal Adexa without tenderness or enlargement.. Digits and nails w/o clubbing, cyanosis, infection, petechiae, ischemia, or inflammatory conditions.. Integumentary (Hair, Skin) No suspicious lesions. No crepitus or fluctuance. No peri-wound warmth or erythema. No masses.Marland Kitchen Psychiatric Judgement and insight Intact.. No evidence of depression, anxiety, or agitation.. Notes The right scapula wound still has undermining but it is very clean and we will address it appropriately. The wound and the sacrum was almost completely healed but there are still 2 small areas open near the inferior part. Bone is not protruding like before. Electronic Signature(s) Signed: 05/06/2015 1:22:45 PM By: Evlyn Kanner MD, FACS Entered By: Evlyn Kanner on 05/06/2015 13:22:44 [name] ([id number]) -------------------------------------------------------------------------------- Physician Orders Details Patient Name: [name], [name] 05/06/2015 12:45 Date of Service: PM Medical Record [id number] Number: Patient Account Number: [id number] June 15, 1966 (49 y.o. Treating RN: Curtis Sites Date of Birth/Sex: Female) Other Clinician: Primary Care Physician: [place], [name] Treating Evlyn Kanner Referring Physician: [place], [name] Physician/Extender: Weeks in Treatment: 14 Verbal  / Phone Orders: Yes Clinician: Curtis Sites Read Back and Verified: Yes Diagnosis Coding Wound Cleansing Wound #1 Right Scapula o Cleanse wound with mild soap and water o May Shower, gently pat wound dry prior to applying new dressing. Wound #2 Midline Sacrum o Cleanse wound with mild soap and water o May Shower, gently pat wound dry prior to applying new dressing. Primary Wound Dressing Wound #1 Right Scapula o Other: - Sorbact - lightly pack undermining (11-5) Wound #2 Midline Sacrum o Aquacel Ag - rope Secondary Dressing Wound #1 Right Scapula o Boardered Foam Dressing Wound #2 Midline Sacrum o Boardered Foam Dressing Dressing Change Frequency Wound #1 Right Scapula o Change dressing every other day. Wound #2 Midline Sacrum o Change dressing every other day. Follow-up Appointments Wound #1 Right Scapula o Return Appointment in 1 week. AYDAN, PHOENIX ([id number]) Off-Loading Wound #1 Right Scapula o Turn and reposition every 2 hours Wound #2 Midline Sacrum o Turn and reposition every 2 hours Home Health Wound #1 Right Scapula o Continue Home Health Visits - [facility] o Home Health Nurse may visit PRN to address patientos wound care needs. o FACE TO FACE ENCOUNTER: MEDICARE and MEDICAID PATIENTS: I certify that this patient is under my care and that I had a face-to-face encounter that meets the physician face-to-face encounter requirements with this patient on this date. The encounter with the patient was in whole or in part for the following MEDICAL CONDITION: (primary reason for Home Healthcare) MEDICAL NECESSITY: I certify, that based on my findings, NURSING services are a medically necessary home health service. HOME BOUND STATUS: I certify that my clinical findings support that this patient is homebound (i.e., Due to illness or injury, pt requires aid of supportive devices such as crutches, cane, wheelchairs, walkers,  the use of special transportation or the assistance of another person to leave their place of residence. There is a normal inability to leave the home and doing so requires considerable and taxing effort. Other absences are for medical reasons / religious services and are infrequent or of short duration when for other reasons). o If current dressing causes regression in wound condition, may D/C ordered dressing  product/s and apply Normal Saline Moist Dressing daily until next Wound Healing Center / Other MD appointment. Notify Wound Healing Center of regression in wound condition at [phone]. o Please direct any NON-WOUND related issues/requests for orders to patient's Primary Care Physician Wound #2 Midline Sacrum o Continue Home Health Visits - [facility] o Home Health Nurse may visit PRN to address patientos wound care needs. o FACE TO FACE ENCOUNTER: MEDICARE and MEDICAID PATIENTS: I certify that this patient is under my care and that I had a face-to-face encounter that meets the physician face-to-face encounter requirements with this patient on this date. The encounter with the patient was in whole or in part for the following MEDICAL CONDITION: (primary reason for Home Healthcare) MEDICAL NECESSITY: I certify, that based on my findings, NURSING services are a medically necessary home health service. HOME BOUND STATUS: I certify that my clinical findings support that this patient is homebound (i.e., Due to illness or injury, pt requires aid of supportive devices such as crutches, cane, wheelchairs, walkers, the use of special transportation or the assistance of another person to leave their place of residence. There is a normal inability to leave the home and doing so requires considerable and taxing effort. Other absences are for medical reasons / religious services and are infrequent or of short duration when for other reasons). o If current dressing causes  regression in wound condition, may D/C ordered dressing product/s and apply Normal Saline Moist Dressing daily until next Wound Healing Center / Other MD appointment. Notify Wound Healing Center of regression in wound condition at [phone]. o Please direct any NON-WOUND related issues/requests for orders to patient's Primary Care Physician [name], Dudley M. ([id number]) Electronic Signature(s) Signed: 05/06/2015 3:36:42 PM By: Evlyn KannerBritto, Arnet Hofferber MD, FACS Signed: 05/06/2015 5:32:28 PM By: Curtis Sitesorthy, Joanna Entered By: Curtis Sitesorthy, Joanna on 05/06/2015 13:16:12 Wolfert, [name] ([id number]) -------------------------------------------------------------------------------- Problem List Details Patient Name: [name], [name] M. 05/06/2015 12:45 Date of Service: PM Medical Record [id number] Number: Patient Account Number: [id number] September 08, 1965 (49 y.o. 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 ([id number]) -------------------------------------------------------------------------------- Progress Note Details Patient Name: [name], [name] M. 05/06/2015 12:45 Date of Service: PM Medical Record [id number] Number: Patient Account Number: [id number] September 08, 1965 (49 y.o. Treating RN: Curtis Sitesorthy, Joanna Date of Birth/Sex: Female) Other  Clinician: Primary Care Physician: [place], [name] Treating Evlyn KannerBritto, Khylin Gutridge Referring Physician: [place], [name] Physician/Extender: Weeks in Treatment: 14 Subjective Chief Complaint Information obtained from Patient Patient presents to the wound care center for a consult due non healing wound. 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 She comes in for a pressure injury to the right scapular region and the sacral region. She has this for several months. 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 For about 4-5 months she's had a decubitus ulcer on the right scapular region and she  has a couple of small areas which have opened out in the region of previous scar tissue in the sacral region. He does not have any other significant comorbidities. She has a good Roho cushion for her wheelchair and she has a specialized mattress for sleep. She has stopped smoking a year ago. 02/11/2015 -- she has not yet got her appointment with the plastic surgeons at [facility]. They did a x-ray of the sacral region this afternoon just before coming here. 02/18/2015 -- X-ray of the pelvis -- IMPRESSION:No acute osseous injury of the pelvis. If there is clinical concern regarding osteomyelitis, recommend further evaluation with CT. X-ray of the sacrum and coccyx IMPRESSION:No acute osseous abnormality of the sacrum. If there is further clinical concern regarding osteomyelitis of the sacrum, further evaluation with CT is recommended. 03/11/2015 -- she has yet to obtain the appointment with the plastic surgeons at [facility]. She says she will be diligent and follow-up on this. 03/18/2015 -- she has an appointment to see the plastic surgeons at [facility] this coming Monday. Also has some issues with her air  mattress and is working on getting this replaced. 03/25/2015 -- her air mattress is working okay now and she does not yet have a firm appointment to see her plastic surgeons. 04/22/2015 -- he have not seen her for about a month and this was due to the fact that she had bronchitis, pneumothorax and was admitted to [facility]. We do not have a plastic surgery opinion as of yet. KEYONDRA, LAGRAND M. ([id number]) Objective Constitutional Pulse regular. Respirations normal and unlabored. Afebrile. Vitals Time Taken: 1:00 PM, Height: 63 in, Weight: 100 lbs, BMI: 17.7, Temperature: 98.0 F, Pulse: 68 bpm, Respiratory Rate: 16 breaths/min, Blood Pressure: 107/69 mmHg. Eyes Nonicteric. Reactive to light. Ears, Nose, Mouth, and Throat Lips, teeth, and gums WNL.Marland Kitchen Moist mucosa without lesions . Neck supple and nontender. No palpable supraclavicular or cervical adenopathy. Normal sized without goiter. Respiratory WNL. No retractions.. Cardiovascular Pedal Pulses WNL. No clubbing, cyanosis or edema. Chest Breasts symmetical and no nipple discharge.. Breast tissue WNL, no masses, lumps, or tenderness.. Lymphatic No adneopathy. No adenopathy. No adenopathy. Musculoskeletal Adexa without tenderness or enlargement.. Digits and nails w/o clubbing, cyanosis, infection, petechiae, ischemia, or inflammatory conditions.Marland Kitchen Psychiatric Judgement and insight Intact.. No evidence of depression, anxiety, or agitation.. General Notes: The right scapula wound still has undermining but it is very clean and we will address it appropriately. The wound and the sacrum was almost completely healed but there are still 2 small areas open near the inferior part. Bone is not protruding like before. Integumentary (Hair, Skin) No suspicious lesions. No crepitus or fluctuance. No peri-wound warmth or erythema. No masses.. Wound #1 status is Open. Original cause of wound was Pressure Injury. The wound is located on the  Right Scapula. The wound measures 1.4cm length x 0.9cm width x 0.7cm depth; 0.99cm^2 area and 0.693cm^3 Suchy, Shawntrice M. ([id number]) volume. There is bone exposed. There is no tunneling noted, however, there is undermining starting at 11:00 and ending at 5:00 with a maximum distance of 1.7cm. There is a large amount of serosanguineous drainage noted. The wound margin is flat and intact. There is large (67-100%) red, pink granulation within the wound bed. There is no necrotic tissue within the wound bed. The periwound skin appearance exhibited: Moist. The periwound skin appearance did not exhibit: Callus, Crepitus, Excoriation, Fluctuance, Friable, Induration, Localized Edema, Rash, Scarring, Dry/Scaly, Maceration, Atrophie Blanche, Cyanosis, Ecchymosis, Hemosiderin Staining, Mottled, Pallor,  Rubor, Erythema. Periwound temperature was noted as No Abnormality. The periwound has tenderness on palpation. Wound #2 status is Open. Original cause of wound was Pressure Injury. The wound is located on the Midline Sacrum. The wound measures 0.5cm length x 2cm width x 0.1cm depth; 0.785cm^2 area and 0.079cm^3 volume. There is bone exposed. There is no tunneling or undermining noted. There is a medium amount of serous drainage noted. The wound margin is flat and intact. There is large (67-100%) pink granulation within the wound bed. There is no necrotic tissue within the wound bed. The periwound skin appearance exhibited: Scarring, Moist. The periwound skin appearance did not exhibit: Callus, Crepitus, Excoriation, Fluctuance, Friable, Induration, Localized Edema, Rash, Dry/Scaly, Maceration, Atrophie Blanche, Cyanosis, Ecchymosis, Hemosiderin Staining, Mottled, Pallor, Rubor, Erythema. Periwound temperature was noted as No Abnormality. The periwound has tenderness on palpation. Assessment Active Problems ICD-10 L89.114 - Pressure ulcer of right upper back, stage 4 L89.154 - Pressure ulcer of sacral  region, stage 4 G82.52 - Quadriplegia, C1-C4 incomplete E44.0 - Moderate protein-calorie malnutrition I have recommended we packed the wound with Sorbact, and we continue to offload and support with nutrition and vitamins. She is again urged to have a repeat appointment with [facility] plastic surgery group. Plan Wound Cleansing: Wound #1 Right Scapula: Cleanse wound with mild soap and water May Shower, gently pat wound dry prior to applying new dressing. Wound #2 Midline Sacrum: Cleanse wound with mild soap and water SHIRLENA, BRINEGAR ([id number]) May Shower, gently pat wound dry prior to applying new dressing. Primary Wound Dressing: Wound #1 Right Scapula: Other: - Sorbact - lightly pack undermining (11-5) Wound #2 Midline Sacrum: Aquacel Ag - rope Secondary Dressing: Wound #1 Right Scapula: Boardered Foam Dressing Wound #2 Midline Sacrum: Boardered Foam Dressing Dressing Change Frequency: Wound #1 Right Scapula: Change dressing every other day. Wound #2 Midline Sacrum: Change dressing every other day. Follow-up Appointments: Wound #1 Right Scapula: Return Appointment in 1 week. Off-Loading: Wound #1 Right Scapula: Turn and reposition every 2 hours Wound #2 Midline Sacrum: Turn and reposition every 2 hours Home Health: Wound #1 Right Scapula: Continue Home Health Visits - [facility] Home Health Nurse may visit PRN to address patient s wound care needs. FACE TO FACE ENCOUNTER: MEDICARE and MEDICAID PATIENTS: I certify that this patient is under my care and that I had a face-to-face encounter that meets the physician face-to-face encounter requirements with this patient on this date. The encounter with the patient was in whole or in part for the following MEDICAL CONDITION: (primary reason for Home Healthcare) MEDICAL NECESSITY: I certify, that based on my findings, NURSING services are a medically necessary home health service. HOME BOUND STATUS: I certify  that my clinical findings support that this patient is homebound (i.e., Due to illness or injury, pt requires aid of supportive devices such as crutches, cane, wheelchairs, walkers, the use of special transportation or the assistance of another person to leave their place of residence. There is a normal inability to leave the home and doing so requires considerable and taxing effort. Other absences are for medical reasons / religious services and are infrequent or of short duration when for other reasons). If current dressing causes regression in wound condition, may D/C ordered dressing product/s and apply Normal Saline Moist Dressing daily until next Wound Healing Center / Other MD appointment. Notify Wound Healing Center of regression in wound condition at [phone]. Please direct any NON-WOUND related issues/requests for orders to patient's Primary Care  Physician Wound #2 Midline Sacrum: Continue Home Health Visits - [facility] Home Health Nurse may visit PRN to address patient s wound care needs. FACE TO FACE ENCOUNTER: MEDICARE and MEDICAID PATIENTS: I certify that this patient is under my care and that I had a face-to-face encounter that meets the physician face-to-face encounter requirements with this patient on this date. The encounter with the patient was in whole or in part for the following MEDICAL CONDITION: (primary reason for Home Healthcare) MEDICAL NECESSITY: I certify, that based on my findings, NURSING services are a medically necessary home health service. HOME BOUND STATUS: I certify that my clinical findings support that this patient is homebound (i.e., Due to [name], [name] ([id number]) illness or injury, pt requires aid of supportive devices such as crutches, cane, wheelchairs, walkers, the use of special transportation or the assistance of another person to leave their place of residence. There is a normal inability to leave the home and doing so requires  considerable and taxing effort. Other absences are for medical reasons / religious services and are infrequent or of short duration when for other reasons). If current dressing causes regression in wound condition, may D/C ordered dressing product/s and apply Normal Saline Moist Dressing daily until next Wound Healing Center / Other MD appointment. Notify Wound Healing Center of regression in wound condition at [phone]. Please direct any NON-WOUND related issues/requests for orders to patient's Primary Care Physician I have recommended we packed the wound with Sorbact, and we continue to offload and support with nutrition and vitamins. She is again urged to have a repeat appointment with [facility] plastic surgery group. Electronic Signature(s) Signed: 05/06/2015 1:23:40 PM By: Evlyn Kanner MD, FACS Entered By: Evlyn Kanner on 05/06/2015 13:23:40 Ralphs, [name] ([id number]) -------------------------------------------------------------------------------- SuperBill Details Patient Name: [name], [name] Date of Service: 05/06/2015 Medical Record Number: [id number] Patient Account Number: [id number] Date of Birth/Sex: 1966-05-01 (49 y.o. 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, C1-C4 incomplete E44.0 Moderate protein-calorie malnutrition Facility Procedures CPT4 Code: [id number] Description: 99213 - WOUND CARE VISIT-LEV 3 EST PT Modifier: Quantity: 1 Physician Procedures CPT4 Code: 5366440 Description: 99213 - WC PHYS LEVEL 3 - EST PT ICD-10 Description Diagnosis 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

## 2015-05-20 ENCOUNTER — Encounter: Payer: Medicare Other | Admitting: Surgery

## 2015-05-20 DIAGNOSIS — L89114 Pressure ulcer of right upper back, stage 4: Secondary | ICD-10-CM | POA: Diagnosis not present

## 2015-05-21 NOTE — Progress Notes (Addendum)
[name] ([id number]) Visit Report for 05/20/2015 Arrival Information Details Patient Name: [name], [name] Date of Service: 05/20/2015 10:00 AM Medical Record Number: [id number] Patient Account Number: [id number] Date of Birth/Sex: 04-Jun-1966 (48 y.o. Female) Treating RN: Curtis Sites Primary Care Physician: [place], JULIE Other Clinician: Referring Physician: [place], JULIE Treating Physician/Extender: Rudene Re in Treatment: 16 Visit Information History Since Last Visit Added or deleted any medications: No Patient Arrived: Wheel Chair Any new allergies or adverse reactions: No Arrival Time: 10:07 Had a fall or experienced change in No activities of daily living that may affect Accompanied By: spouse risk of falls: Transfer Assistance: Manual Signs or symptoms of abuse/neglect since last No Patient Identification Verified: Yes visito Secondary Verification Process Yes Hospitalized since last visit: No Completed: Pain Present Now: No Patient Has Alerts: No Electronic Signature(s) Signed: 05/20/2015 5:15:54 PM By: Curtis Sites Entered By: Curtis Sites on 05/20/2015 10:08:15 [name] -------------------------------------------------------------------------------- Clinic Level of Care Assessment Details Patient Name: [name] Date of Service: 05/20/2015 10:00 AM Medical Record Number: [id number] Patient Account Number: [id number] Date of Birth/Sex: Nov 16, 1965 (49 y.o. Female) Treating RN: Curtis Sites Primary Care Physician: [place], JULIE Other Clinician: Referring Physician: [place], JULIE Treating Physician/Extender: Rudene Re in Treatment: 16 Clinic Level of Care Assessment Items TOOL 4 Quantity Score []  - Use when only an EandM is performed on FOLLOW-UP visit 0 ASSESSMENTS - Nursing Assessment / Reassessment X - Reassessment of Co-morbidities (includes updates in patient status) 1 10 X - Reassessment  of Adherence to Treatment Plan 1 5 ASSESSMENTS - Wound and Skin Assessment / Reassessment []  - Simple Wound Assessment / Reassessment - one wound 0 X - Complex Wound Assessment / Reassessment - multiple wounds 2 5 []  - Dermatologic / Skin Assessment (not related to wound area) 0 ASSESSMENTS - Focused Assessment []  - Circumferential Edema Measurements - multi extremities 0 []  - Nutritional Assessment / Counseling / Intervention 0 []  - Lower Extremity Assessment (monofilament, tuning fork, pulses) 0 []  - Peripheral Arterial Disease Assessment (using hand held doppler) 0 ASSESSMENTS - Ostomy and/or Continence Assessment and Care []  - Incontinence Assessment and Management 0 []  - Ostomy Care Assessment and Management (repouching, etc.) 0 PROCESS - Coordination of Care X - Simple Patient / Family Education for ongoing care 1 15 []  - Complex (extensive) Patient / Family Education for ongoing care 0 []  - Staff obtains Chiropractor, Records, Test Results / Process Orders 0 []  - Staff telephones HHA, Nursing Homes / Clarify orders / etc 0 []  - Routine Transfer to another Facility (non-emergent condition) 0 [name], [name] ([id number]) []  - Routine Hospital Admission (non-emergent condition) 0 []  - New Admissions / Manufacturing engineer / Ordering NPWT, Apligraf, etc. 0 []  - Emergency Hospital Admission (emergent condition) 0 X - Simple Discharge Coordination 1 10 []  - Complex (extensive) Discharge Coordination 0 PROCESS - Special Needs []  - Pediatric / Minor Patient Management 0 []  - Isolation Patient Management 0 []  - Hearing / Language / Visual special needs 0 []  - Assessment of Community assistance (transportation, D/C planning, etc.) 0 []  - Additional assistance / Altered mentation 0 []  - Support Surface(s) Assessment (bed, cushion, seat, etc.) 0 INTERVENTIONS - Wound Cleansing / Measurement []  - Simple Wound Cleansing - one wound 0 X - Complex Wound Cleansing - multiple wounds 2 5 X -  Wound Imaging (photographs - any number of wounds) 1 5 []  - Wound Tracing (instead of photographs) 0 []  - Simple Wound Measurement -  one wound 0 X - Complex Wound Measurement - multiple wounds 2 5 INTERVENTIONS - Wound Dressings X - Small Wound Dressing one or multiple wounds 2 10 []  - Medium Wound Dressing one or multiple wounds 0 []  - Large Wound Dressing one or multiple wounds 0 []  - Application of Medications - topical 0 []  - Application of Medications - injection 0 INTERVENTIONS - Miscellaneous []  - External ear exam 0 [name], [name] ([id number]) []  - Specimen Collection (cultures, biopsies, blood, body fluids, etc.) 0 []  - Specimen(s) / Culture(s) sent or taken to Lab for analysis 0 []  - Patient Transfer (multiple staff / Michiel Sites Lift / Similar devices) 0 []  - Simple Staple / Suture removal (25 or less) 0 []  - Complex Staple / Suture removal (26 or more) 0 []  - Hypo / Hyperglycemic Management (close monitor of Blood Glucose) 0 []  - Ankle / Brachial Index (ABI) - do not check if billed separately 0 X - Vital Signs 1 5 Has the patient been seen at the hospital within the last three years: Yes Total Score: 100 Level Of Care: New/Established - Level 3 Electronic Signature(s) Signed: 05/20/2015 5:15:54 PM By: Curtis Sites Entered By: Curtis Sites on 05/20/2015 10:35:34 [name], [name] ([id number]) -------------------------------------------------------------------------------- Complex / Palliative Patient Assessment Details Patient Name: [name] Date of Service: 05/20/2015 10:00 AM Medical Record Number: [id number] Patient Account Number: [id number] Date of Birth/Sex: [birth year] (49 y.o. Female) Treating RN: Huel Coventry Primary Care Physician: [place], JULIE Other Clinician: Referring Physician: [place], JULIE Treating Physician/Extender: Rudene Re in Treatment: 16 Palliative Management Criteria Complex Wound Management Criteria Patient requires a  surgical procedure in order to achieve wound healing: waiting on patient to follow-up with [facility] However, the patient does not wish to undergo the recommended surgical procedure. Care Approach Wound Care Plan: Complex Wound Management Electronic Signature(s) Signed: 05/31/2015 5:17:02 PM By: Elliot Gurney RN, BSN, Kim RN, BSN Signed: 06/07/2015 4:31:40 PM By: Evlyn Kanner MD, FACS Entered By: Elliot Gurney RN, BSN, Kim on 05/31/2015 13:58:43 [name], [name] ([id number]) -------------------------------------------------------------------------------- Encounter Discharge Information Details Patient Name: [name], [name] Date of Service: 05/20/2015 10:00 AM Medical Record Number: [id number] Patient Account Number: [id number] Date of Birth/Sex: 27-May-1966 (49 y.o. Female) Treating RN: Curtis Sites Primary Care Physician: [place], JULIE Other Clinician: Referring Physician: [place], JULIE Treating Physician/Extender: Rudene Re in Treatment: 16 Encounter Discharge Information Items Discharge Pain Level: 0 Discharge Condition: Stable Ambulatory Status: Wheelchair Discharge Destination: Home Transportation: Private Auto Accompanied By: spouse Schedule Follow-up Appointment: Yes Medication Reconciliation completed and provided to Patient/Care No Herminia Warren: Provided on Clinical Summary of Care: 05/20/2015 Form Type Recipient Paper Patient CW Electronic Signature(s) Signed: 05/20/2015 10:39:25 AM By: Gwenlyn Perking Entered By: Gwenlyn Perking on 05/20/2015 10:39:24 Magar, [name] ([id number]) -------------------------------------------------------------------------------- Multi Wound Chart Details Patient Name: [name] Date of Service: 05/20/2015 10:00 AM Medical Record Number: [id number] Patient Account Number: [id number] Date of Birth/Sex: 06-13-1966 (49 y.o. Female) Treating RN: Curtis Sites Primary Care Physician: [place], JULIE Other Clinician: Referring Physician:  [place], JULIE Treating Physician/Extender: Rudene Re in Treatment: 16 Vital Signs Height(in): 63 Pulse(bpm): 92 Weight(lbs): 100 Blood Pressure 97/74 (mmHg): Body Mass Index(BMI): 18 Temperature(F): 99.0 Respiratory Rate 18 (breaths/min): Photos: [1:No Photos] [2:No Photos] [N/A:N/A] Wound Location: [1:Right Scapula] [2:Sacrum - Midline] [N/A:N/A] Wounding Event: [1:Pressure Injury] [2:Pressure Injury] [N/A:N/A] Primary Etiology: [1:Pressure Ulcer] [2:Pressure Ulcer] [N/A:N/A] Comorbid History: [1:Asthma, Chronic Obstructive Pulmonary Disease (COPD), Paraplegia] [2:Asthma, Chronic Obstructive Pulmonary Disease (COPD), Paraplegia] [N/A:N/A] Date Acquired: [1:09/21/2014] [2:12/02/2007] [N/A:N/A]  Weeks of Treatment: [1:16] [2:16] [N/A:N/A] Wound Status: [1:Open] [2:Open] [N/A:N/A] Measurements L x W x D 0.6x0.8x0.6 [2:0.4x1x0.1] [N/A:N/A] (cm) Area (cm) : [1:0.377] [2:0.314] [N/A:N/A] Volume (cm) : [1:0.226] [2:0.031] [N/A:N/A] % Reduction in Area: [1:69.20%] [2:58.40%] [N/A:N/A] % Reduction in Volume: 76.90% [2:58.70%] [N/A:N/A] Starting Position 1 11 (o'clock): Ending Position 1 [1:5] (o'clock): Maximum Distance 1 1.8 (cm): Undermining: [1:Yes] [2:No] [N/A:N/A] 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] [N/A:N/A] Exudate Amount: [1:Large] [2:Medium] [N/A:N/A] Exudate Type: [1:Serosanguineous] [2:Serous] [N/A:N/A] Exudate Color: [1:red, brown] [2:amber] [N/A:N/A] Foul Odor After [1:Yes] [2:No] [N/A:N/A] Cleansing: [name], [name] ([id number]) Odor Anticipated Due to No N/A N/A Product Use: Wound Margin: Flat and Intact Flat and Intact N/A Granulation Amount: Large (67-100%) Large (67-100%) N/A Granulation Quality: Red, Pink Pink N/A Necrotic Amount: None Present (0%) None Present (0%) N/A Exposed Structures: Bone: Yes Bone: Yes N/A Fascia: No Fascia: No Fat: No Fat: No Tendon: No Tendon: No Muscle: No Muscle: No Joint:  No Joint: No Epithelialization: None Large (67-100%) N/A Periwound Skin Texture: Edema: No Scarring: Yes N/A Excoriation: No Edema: No Induration: No Excoriation: No Callus: No Induration: No Crepitus: No Callus: No Fluctuance: No Crepitus: No Friable: No Fluctuance: No Rash: No Friable: No Scarring: No Rash: No Periwound Skin Moist: Yes Moist: Yes N/A Moisture: Maceration: No Maceration: No Dry/Scaly: No Dry/Scaly: No Periwound Skin Color: Atrophie Blanche: No Atrophie Blanche: No N/A Cyanosis: No Cyanosis: No Ecchymosis: No Ecchymosis: No Erythema: No Erythema: No Hemosiderin Staining: No Hemosiderin Staining: No Mottled: No Mottled: No Pallor: No Pallor: No Rubor: No Rubor: No Temperature: No Abnormality No Abnormality N/A Tenderness on Yes Yes N/A Palpation: Wound Preparation: Ulcer Cleansing: Ulcer Cleansing: N/A Rinsed/Irrigated with Rinsed/Irrigated with Saline Saline Topical Anesthetic Topical Anesthetic Applied: None Applied: None Treatment Notes Electronic Signature(s) Signed: 05/20/2015 5:15:54 PM By: Curtis Sites Entered By: Curtis Sites on 05/20/2015 10:21:25 Depascale, [name] ([id number]) Eguia, [name] -------------------------------------------------------------------------------- Multi-Disciplinary Care Plan Details Patient Name: FATIMAH, SUNDQUIST. Date of Service: 05/20/2015 10:00 AM Medical Record Number: [id number] Patient Account Number: [id number] Date of Birth/Sex: 19-Oct-1965 (49 y.o. Female) Treating RN: Curtis Sites Primary Care Physician: [place], JULIE Other Clinician: Referring Physician: [place], JULIE Treating Physician/Extender: Rudene Re in Treatment: 16 Active Inactive Abuse / Safety / Falls / Self Care Management Nursing Diagnoses: Impaired home maintenance Impaired physical mobility Potential for falls Self care deficit: actual or potential Goals: Patient/caregiver will  verbalize understanding of skin care regimen Date Initiated: 01/27/2015 Goal Status: Active Patient/caregiver will verbalize/demonstrate measure taken to improve self care Date Initiated: 01/27/2015 Goal Status: Active Patient/caregiver will verbalize/demonstrate measures taken to improve the patient's personal safety Date Initiated: 01/27/2015 Goal Status: Active Patient/caregiver will verbalize/demonstrate measures taken to prevent injury and/or falls Date Initiated: 01/27/2015 Goal Status: Active Patient/caregiver will verbalize/demonstrate understanding of what to do in case of emergency Date Initiated: 01/27/2015 Goal Status: Active Interventions: Assess fall risk on admission and as needed Assess: immobility, friction, shearing, incontinence upon admission and as needed Assess impairment of mobility on admission and as needed per policy Assess self care needs on admission and as needed Provide education on basic hygiene Provide education on fall prevention Provide education on personal and home safety Provide education on safe transfers [name], [name] ([id number]) Treatment Activities: Education provided on Basic Hygiene : 02/04/2015 Notes: Orientation to the Wound Care Program Nursing Diagnoses: Knowledge deficit related to the wound healing center program Goals: Patient/caregiver will verbalize understanding of the Wound Healing Center Program Date Initiated: 01/27/2015 Goal Status: Active  Interventions: Provide education on orientation to the wound center Notes: Pressure Nursing Diagnoses: Knowledge deficit related to causes and risk factors for pressure ulcer development Knowledge deficit related to management of pressures ulcers Potential for impaired tissue integrity related to pressure, friction, moisture, and shear Goals: Patient will remain free from development of additional pressure ulcers Date Initiated: 01/27/2015 Goal Status: Active Patient will remain  free of pressure ulcers Date Initiated: 01/27/2015 Goal Status: Active Patient/caregiver will verbalize risk factors for pressure ulcer development Date Initiated: 01/27/2015 Goal Status: Active Patient/caregiver will verbalize understanding of pressure ulcer management Date Initiated: 01/27/2015 Goal Status: Active Interventions: Assess: immobility, friction, shearing, incontinence upon admission and as needed Assess offloading mechanisms upon admission and as needed Assess potential for pressure ulcer upon admission and as needed Provide education on pressure ulcers [name], WASS. ([id number]) Treatment Activities: Patient referred for pressure reduction/relief devices : 05/20/2015 Patient referred for seating evaluation to ensure proper offloading : 05/20/2015 Notes: Wound/Skin Impairment Nursing Diagnoses: Impaired tissue integrity Knowledge deficit related to smoking impact on wound healing Knowledge deficit related to ulceration/compromised skin integrity Goals: Patient/caregiver will verbalize understanding of skin care regimen Date Initiated: 01/27/2015 Goal Status: Active Ulcer/skin breakdown will have a volume reduction of 30% by week 4 Date Initiated: 01/27/2015 Goal Status: Active Ulcer/skin breakdown will have a volume reduction of 50% by week 8 Date Initiated: 01/27/2015 Goal Status: Active Ulcer/skin breakdown will have a volume reduction of 80% by week 12 Date Initiated: 01/27/2015 Goal Status: Active Ulcer/skin breakdown will heal within 14 weeks Date Initiated: 01/27/2015 Goal Status: Active Interventions: Assess ulceration(s) every visit Provide education on ulcer and skin care Notes: Electronic Signature(s) Signed: 05/20/2015 5:15:54 PM By: Curtis Sites Entered By: Curtis Sites on 05/20/2015 10:21:18 [name] ([id number]) -------------------------------------------------------------------------------- Patient/Caregiver Education  Details Patient Name: [name] Date of Service: 05/20/2015 10:00 AM Medical Record Number: [id number] Patient Account Number: [id number] Date of Birth/Gender: 12/02/1965 (49 y.o. Female) Treating RN: Curtis Sites Primary Care Physician: [place], JULIE Other Clinician: Referring Physician: [place], JULIE Treating Physician/Extender: Rudene Re in Treatment: 16 Education Assessment Education Provided To: Patient and Caregiver Education Topics Provided Wound/Skin Impairment: Handouts: Other: wound care as ordered Methods: Demonstration, Explain/Verbal Responses: State content correctly Electronic Signature(s) Signed: 05/20/2015 5:15:54 PM By: Curtis Sites Entered By: Curtis Sites on 05/20/2015 10:37:02 [name], [name] ([id number]) -------------------------------------------------------------------------------- Wound Assessment Details Patient Name: [name] Date of Service: 05/20/2015 10:00 AM Medical Record Number: [id number] Patient Account Number: [id number] Date of Birth/Sex: Oct 29, 1965 (49 y.o. Female) Treating RN: Curtis Sites Primary Care Physician: [place], JULIE Other Clinician: Referring Physician: [place], JULIE Treating Physician/Extender: Rudene Re in Treatment: 16 Wound Status Wound Number: 1 Primary Pressure Ulcer Etiology: Wound Location: Right Scapula Wound Open Wounding Event: Pressure Injury Status: Date Acquired: 09/21/2014 Comorbid Asthma, Chronic Obstructive Weeks Of Treatment: 16 History: Pulmonary Disease (COPD), Clustered Wound: No Paraplegia Photos Photo Uploaded By: Curtis Sites on 05/20/2015 16:09:25 Wound Measurements Length: (cm) 0.6 Width: (cm) 0.8 Depth: (cm) 0.6 Area: (cm) 0.377 Volume: (cm) 0.226 % Reduction in Area: 69.2% % Reduction in Volume: 76.9% Epithelialization: None Tunneling: No Undermining: Yes Starting Position (o'clock): 11 Ending Position (o'clock): 5 Maximum  Distance: (cm) 1.8 Wound Description Classification: Category/Stage IV Wound Margin: Flat and Intact Exudate Amount: Large Exudate Type: Serosanguineous Exudate Color: red, brown Foul Odor After Cleansing: Yes Due to Product Use: No Wound Bed Rotan, Annis M. ([id number]) Granulation Amount: Large (67-100%) Exposed Structure Granulation Quality: Red, Pink Fascia Exposed: No Necrotic  Amount: None Present (0%) Fat Layer Exposed: No Tendon Exposed: No Muscle Exposed: No Joint Exposed: No Bone Exposed: Yes Periwound Skin Texture Texture Color No Abnormalities Noted: No No Abnormalities Noted: No Callus: No Atrophie Blanche: No Crepitus: No Cyanosis: No Excoriation: No Ecchymosis: No Fluctuance: No Erythema: No Friable: No Hemosiderin Staining: No Induration: No Mottled: No Localized Edema: No Pallor: No Rash: No Rubor: No Scarring: No Temperature / Pain Moisture Temperature: No Abnormality No Abnormalities Noted: No Tenderness on Palpation: Yes Dry / Scaly: No Maceration: No Moist: Yes Wound Preparation Ulcer Cleansing: Rinsed/Irrigated with Saline Topical Anesthetic Applied: None Treatment Notes Wound #1 (Right Scapula) 1. Cleansed with: Clean wound with Normal Saline 3. Peri-wound Care: Skin Prep 4. Dressing Applied: Other dressing (specify in notes) 5. Secondary Dressing Applied Bordered Foam Dressing Notes sorbact Electronic Signature(s) Signed: 05/20/2015 5:15:54 PM By: Curtis Sites Entered By: Curtis Sites on 05/20/2015 10:18:01 [name], [name] [name], [name] ([id number]) -------------------------------------------------------------------------------- Wound Assessment Details Patient Name: [name], [name] Date of Service: 05/20/2015 10:00 AM Medical Record Number: [id number] Patient Account Number: [id number] Date of Birth/Sex: 14-Nov-1965 (49 y.o. Female) Treating RN: Curtis Sites Primary Care Physician: [place],  JULIE Other Clinician: Referring Physician: [place], JULIE Treating Physician/Extender: Rudene Re in Treatment: 16 Wound Status Wound Number: 2 Primary Pressure Ulcer Etiology: Wound Location: Sacrum - Midline Wound Open Wounding Event: Pressure Injury Status: Date Acquired: 12/02/2007 Comorbid Asthma, Chronic Obstructive Weeks Of Treatment: 16 History: Pulmonary Disease (COPD), Clustered Wound: No Paraplegia Photos Photo Uploaded By: Curtis Sites on 05/20/2015 16:09:42 Wound Measurements Length: (cm) 0.4 Width: (cm) 1 Depth: (cm) 0.1 Area: (cm) 0.314 Volume: (cm) 0.031 % Reduction in Area: 58.4% % Reduction in Volume: 58.7% Epithelialization: Large (67-100%) Tunneling: No Undermining: No Wound Description Classification: Category/Stage IV Wound Margin: Flat and Intact Exudate Amount: Medium Exudate Type: Serous Exudate Color: amber Foul Odor After Cleansing: No Wound Bed Granulation Amount: Large (67-100%) Exposed Structure Granulation Quality: Pink Fascia Exposed: No Necrotic Amount: None Present (0%) Fat Layer Exposed: No Tendon Exposed: No Wahlquist, Rea M. ([id number]) Muscle Exposed: No Joint Exposed: No Bone Exposed: Yes Periwound Skin Texture Texture Color No Abnormalities Noted: No No Abnormalities Noted: No Callus: No Atrophie Blanche: No Crepitus: No Cyanosis: No Excoriation: No Ecchymosis: No Fluctuance: No Erythema: No Friable: No Hemosiderin Staining: No Induration: No Mottled: No Localized Edema: No Pallor: No Rash: No Rubor: No Scarring: Yes Temperature / Pain Moisture Temperature: No Abnormality No Abnormalities Noted: No Tenderness on Palpation: Yes Dry / Scaly: No Maceration: No Moist: Yes Wound Preparation Ulcer Cleansing: Rinsed/Irrigated with Saline Topical Anesthetic Applied: None Treatment Notes Wound #2 (Midline Sacrum) 1. Cleansed with: Clean wound with Normal Saline 3. Peri-wound Care: Skin  Prep 4. Dressing Applied: Aquacel Ag 5. Secondary Dressing Applied Bordered Foam Dressing Electronic Signature(s) Signed: 05/20/2015 5:15:54 PM By: Curtis Sites Entered By: Curtis Sites on 05/20/2015 10:21:06 Kachel, [name] ([id number]) -------------------------------------------------------------------------------- Vitals Details Patient Name: TEMECA, SOMMA. Date of Service: 05/20/2015 10:00 AM Medical Record Number: [id number] Patient Account Number: [id number] Date of Birth/Sex: [birth year] (49 y.o. Female) Treating RN: Curtis Sites Primary Care Physician: [place], JULIE Other Clinician: Referring Physician: [place], JULIE Treating Physician/Extender: Rudene Re in Treatment: 16 Vital Signs Time Taken: 10:09 Temperature (F): 99.0 Height (in): 63 Pulse (bpm): 92 Weight (lbs): 100 Respiratory Rate (breaths/min): 18 Body Mass Index (BMI): 17.7 Blood Pressure (mmHg): 97/74 Reference Range: 80 - 120 mg / dl Electronic Signature(s) Signed: 05/20/2015 5:15:54 PM By: Curtis Sites Entered  By: Curtis Sitesorthy, Joanna on 05/20/2015 10:09:14

## 2015-05-21 NOTE — Progress Notes (Signed)
[name], [name] ([id number]) Visit Report for 05/20/2015 Chief Complaint Document Details Patient Name: [name], [name] 05/20/2015 10:00 Date of Service: AM Medical Record [id number] Number: Patient Account Number: [id number] [birth year] (49 y.o. Treating RN: [name] Date of Birth/Sex: Female) Other Clinician: Primary Care Physician: [name] Treating [name] Referring Physician: Palestinian [name], [name] Physician/Extender: Weeks in Treatment: 16 Information Obtained from: Patient Chief Complaint Patient presents to the wound care center for a consult due non healing wound. 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 She comes in for a pressure injury to the right scapular region and the sacral region. She has this for several months. Electronic Signature(s) Signed: 05/20/2015 10:59:45 AM By: [name] Kanner MD, FACS Entered By: [name] on 05/20/2015 10:59:45 [name] -------------------------------------------------------------------------------- HPI Details Patient Name: [name], [name] 05/20/2015 10:00 Date of Service: AM Medical Record [id number] Number: Patient Account Number: [id number] 1966-02-25 (49 y.o. 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 For about 4-5 months she's had a decubitus ulcer on the right scapular region and she has a couple of small areas which have opened out in the region of previous scar tissue in the sacral region. He does not have any other significant comorbidities. She has a good  Roho cushion for her wheelchair and she has a specialized mattress for sleep. She has stopped smoking a year ago. 02/11/2015 -- she has not yet got her appointment with the plastic surgeons at [facility]. They did a x-ray of the sacral region this afternoon just before coming here. 02/18/2015 -- X-ray of the pelvis -- IMPRESSION:No acute osseous injury of the pelvis. If there is clinical concern regarding osteomyelitis, recommend further evaluation with CT. X-ray of the sacrum and coccyx IMPRESSION:No acute osseous abnormality of the sacrum. If there is further clinical concern regarding osteomyelitis of the sacrum, further evaluation with CT is recommended. 03/11/2015 -- she has yet to obtain the appointment with the plastic surgeons at [facility]. She says she will be diligent and follow-up on this. 03/18/2015 -- she has an appointment to see the plastic surgeons at [facility] this coming Monday. Also has some issues with her air mattress and is working on getting this replaced. 03/25/2015 -- her air mattress is working okay now and she does not yet have a firm appointment to see her plastic surgeons. 04/22/2015 -- he have not seen her for about a month and this was due to the fact that she had bronchitis, pneumothorax and was admitted to [facility]. We do not have a plastic surgery opinion as of yet. Electronic Signature(s) Signed: 05/20/2015 10:59:51 AM By: [name] Kanner MD, FACS Entered By: [name] on 05/20/2015 10:59:50 [name] ([id number]) -------------------------------------------------------------------------------- Physical Exam Details Patient Name: [name], [name] 05/20/2015 10:00 Date of Service: AM Medical Record [id number] Number: Patient Account Number: [id number] May 23, 1966 (49 y.o. Treating RN: [name] Date of Birth/Sex: Female) Other Clinician: Primary Care Physician: [name] Treating [name] Referring Physician: Palestinian [name],  [name] Physician/Extender: Weeks in Treatment: 16 Constitutional . Pulse regular. Respirations normal and unlabored. Afebrile. . Eyes Nonicteric. Reactive to light. Ears, Nose, Mouth, and Throat Lips, teeth, and gums  WNL.. Moist mucosa without lesions . Neck supple and nontender. No palpable supraclavicular or cervical adenopathy. Normal sized without goiter. Respiratory WNL. No retractions.. Cardiovascular Pedal Pulses WNL. No clubbing, cyanosis or edema. Lymphatic No adneopathy. No adenopathy. No adenopathy. Musculoskeletal Adexa without tenderness or enlargement.. Digits and nails w/o clubbing, cyanosis, infection, petechiae, ischemia, or inflammatory conditions.. Integumentary (Hair, Skin) No suspicious lesions. No crepitus or fluctuance. No peri-wound warmth or erythema. No masses.Marland Kitchen Psychiatric Judgement and insight Intact.. No evidence of depression, anxiety, or agitation.. Notes The sacral wound looks excellent and there is minimal open ulceration. The right scapula is undermining a bit but it is smaller and cleaner. Electronic Signature(s) Signed: 05/20/2015 11:00:41 AM By: [name] Kanner MD, FACS Entered By: [name] on 05/20/2015 11:00:41 [name] ([id number]) -------------------------------------------------------------------------------- Physician Orders Details Patient Name: [name] 05/20/2015 10:00 Date of Service: AM Medical Record [id number] Number: Patient Account Number: [id number] Nov 05, 1965 (49 y.o. Treating RN: [name] Date of Birth/Sex: Female) Other Clinician: Primary Care Physician: [name] Treating [name] Referring Physician: Palestinian [name], [name] Physician/Extender: Weeks in Treatment: 16 Verbal / Phone Orders: Yes Clinician: Curtis [name] Read Back and Verified: Yes Diagnosis Coding Wound Cleansing Wound #1 Right Scapula o Cleanse wound with mild soap and water o May Shower, gently pat wound dry  prior to applying new dressing. Wound #2 Midline Sacrum o Cleanse wound with mild soap and water o May Shower, gently pat wound dry prior to applying new dressing. Primary Wound Dressing Wound #1 Right Scapula o Other: - Sorbact - lightly pack undermining (11-5) Wound #2 Midline Sacrum o Aquacel Ag - rope Secondary Dressing Wound #1 Right Scapula o Boardered Foam Dressing Wound #2 Midline Sacrum o Boardered Foam Dressing Dressing Change Frequency Wound #1 Right Scapula o Change dressing every other day. Wound #2 Midline Sacrum o Change dressing every other day. Follow-up Appointments Wound #1 Right Scapula o Return Appointment in 2 weeks. [name], [name] ([id number]) Wound #2 Midline Sacrum o Return Appointment in 2 weeks. Off-Loading Wound #1 Right Scapula o Turn and reposition every 2 hours Wound #2 Midline Sacrum o Turn and reposition every 2 hours Home Health Wound #1 Right Scapula o Continue Home Health Visits - [facility] o Home Health Nurse may visit PRN to address patientos wound care needs. o FACE TO FACE ENCOUNTER: MEDICARE and MEDICAID PATIENTS: I certify that this patient is under my care and that I had a face-to-face encounter that meets the physician face-to-face encounter requirements with this patient on this date. The encounter with the patient was in whole or in part for the following MEDICAL CONDITION: (primary reason for Home Healthcare) MEDICAL NECESSITY: I certify, that based on my findings, NURSING services are a medically necessary home health service. HOME BOUND STATUS: I certify that my clinical findings support that this patient is homebound (i.e., Due to illness or injury, pt requires aid of supportive devices such as crutches, cane, wheelchairs, walkers, the use of special transportation or the assistance of another person to leave their place of residence. There is a normal inability to leave the home  and doing so requires considerable and taxing effort. Other absences are for medical reasons / religious services and are infrequent or of short duration when for other reasons). o If current dressing causes regression in wound condition, may D/C ordered dressing product/s and apply Normal Saline Moist Dressing daily until next Wound Healing Center / Other MD appointment. Notify Wound Healing Center of regression in wound condition  at [phone]. o Please direct any NON-WOUND related issues/requests for orders to patient's Primary Care Physician Wound #2 Midline Sacrum o Continue Home Health Visits - [facility] o Home Health Nurse may visit PRN to address patientos wound care needs. o FACE TO FACE ENCOUNTER: MEDICARE and MEDICAID PATIENTS: I certify that this patient is under my care and that I had a face-to-face encounter that meets the physician face-to-face encounter requirements with this patient on this date. The encounter with the patient was in whole or in part for the following MEDICAL CONDITION: (primary reason for Home Healthcare) MEDICAL NECESSITY: I certify, that based on my findings, NURSING services are a medically necessary home health service. HOME BOUND STATUS: I certify that my clinical findings support that this patient is homebound (i.e., Due to illness or injury, pt requires aid of supportive devices such as crutches, cane, wheelchairs, walkers, the use of special transportation or the assistance of another person to leave their place of residence. There is a normal inability to leave the home and doing so requires considerable and taxing effort. Other absences are for medical reasons / religious services and are infrequent or of short duration when for other reasons). [name], [name] .. ([id number]) o If current dressing causes regression in wound condition, may D/C ordered dressing product/s and apply Normal Saline Moist Dressing daily until next  Wound Healing Center / Other MD appointment. Notify Wound Healing Center of regression in wound condition at [phone]. o Please direct any NON-WOUND related issues/requests for orders to patient's Primary Care Physician Electronic Signature(s) Signed: 05/20/2015 4:05:55 PM By: [name] KannerBritto, Ilay Capshaw MD, FACS Signed: 05/20/2015 5:15:54 PM By: [name] Sitesorthy, Joanna Entered By: [name] Sitesorthy, Joanna on 05/20/2015 10:25:09 [place], Balinda QuailsHARLOTTE [name] ([id number]) -------------------------------------------------------------------------------- Problem List Details Patient Name: [name], [name] [name] 05/20/2015 10:00 Date of Service: AM Medical Record [id number] Number: Patient Account Number: [id number] October 26, 1965 (49 y.o. 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 ([id number]) -------------------------------------------------------------------------------- Progress Note Details Patient Name: [name], [name] [name] 05/20/2015 10:00 Date of Service: AM Medical Record [id number] Number: Patient Account Number: [id number] October 26, 1965 (49 y.o. Treating RN: [name] Sitesorthy, Joanna Date of Birth/Sex: Female) Other Clinician: Primary Care Physician: [name] TerritoryMONACO, [name] Treating [name] KannerBritto, Lenox Bink Referring Physician: Palestinian TerritoryMONACO, [name] Physician/Extender: Weeks in Treatment:  16 Subjective Chief Complaint Information obtained from Patient Patient presents to the wound care center for a consult due non healing wound. 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 She comes in for a pressure injury to the right scapular region and the sacral region. She has this for several months. 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 For about 4-5 months she's had a decubitus ulcer on the right scapular region and she has a couple of small areas which have opened out in the region of previous scar tissue in the sacral region. He does not have any  other significant comorbidities. She has a good Roho cushion for her wheelchair and she has a specialized mattress for sleep. She has stopped smoking a year ago. 02/11/2015 -- she has not yet got her appointment with the plastic surgeons at [facility]. They did a x-ray of the sacral region this afternoon just before coming here. 02/18/2015 -- X-ray of the pelvis -- IMPRESSION:No acute osseous injury of the pelvis. If there is clinical concern regarding osteomyelitis, recommend further evaluation with CT. X-ray of the sacrum and coccyx IMPRESSION:No acute osseous abnormality of the sacrum. If there is further clinical concern regarding osteomyelitis of the sacrum, further evaluation with CT is recommended. 03/11/2015 -- she has yet to obtain the appointment with the plastic surgeons at [facility]. She says she will be diligent and follow-up on this. 03/18/2015 -- she has an appointment to see the plastic surgeons at [facility] this coming Monday. Also has some issues with her air mattress and is working on getting this replaced. 03/25/2015 -- her air mattress is working okay now and she does not yet have a firm appointment to see  her plastic surgeons. 04/22/2015 -- he have not seen her for about a month and this was due to the fact that she had bronchitis, pneumothorax and was admitted to [facility]. We do not have a plastic surgery opinion as of yet. [name], [name] [name] ([id number]) Objective Constitutional Pulse regular. Respirations normal and unlabored. Afebrile. Vitals Time Taken: 10:09 AM, Height: 63 in, Weight: 100 lbs, BMI: 17.7, Temperature: 99.0 F, Pulse: 92 bpm, Respiratory Rate: 18 breaths/min, Blood Pressure: 97/74 mmHg. Eyes Nonicteric. Reactive to light. Ears, Nose, Mouth, and Throat Lips, teeth, and gums WNL.Marland Kitchen Moist mucosa without lesions . Neck supple and nontender. No palpable supraclavicular or cervical adenopathy. Normal sized without goiter. Respiratory WNL. No retractions.. Cardiovascular Pedal Pulses WNL. No clubbing, cyanosis or edema. Lymphatic No adneopathy. No adenopathy. No adenopathy. Musculoskeletal Adexa without tenderness or enlargement.. Digits and nails w/o clubbing, cyanosis, infection, petechiae, ischemia, or inflammatory conditions.Marland Kitchen Psychiatric Judgement and insight Intact.. No evidence of depression, anxiety, or agitation.. General Notes: The sacral wound looks excellent and there is minimal open ulceration. The right scapula is undermining a bit but it is smaller and cleaner. Integumentary (Hair, Skin) No suspicious lesions. No crepitus or fluctuance. No peri-wound warmth or erythema. No masses.. Wound #1 status is Open. Original cause of wound was Pressure Injury. The wound is located on the Right Scapula. The wound measures 0.6cm length x 0.8cm width x 0.6cm depth; 0.377cm^2 area and 0.226cm^3 volume. There is bone exposed. There is no tunneling noted, however, there is undermining starting at 11:00 and ending at 5:00 with a maximum distance of 1.8cm. There is a large amount of serosanguineous drainage noted. The wound margin is flat and intact. There is large  (67-100%) red, pink granulation within the wound bed. There is no necrotic tissue within the wound bed. The periwound skin appearance [name], [name] ([id number]) exhibited: Moist. The periwound skin appearance did not exhibit: Callus, Crepitus, Excoriation, Fluctuance, Friable, Induration, Localized Edema, Rash, Scarring, Dry/Scaly, Maceration, Atrophie Blanche, Cyanosis, Ecchymosis, Hemosiderin Staining, Mottled, Pallor, Rubor, Erythema. Periwound temperature was noted as No Abnormality. The periwound has tenderness on palpation. Wound #2 status is Open. Original cause of wound was Pressure Injury. The wound is located on the Midline Sacrum. The wound measures 0.4cm length x 1cm width x 0.1cm depth; 0.314cm^2 area and 0.031cm^3 volume. There is bone exposed. There is no tunneling or undermining noted.  There is a medium amount of serous drainage noted. The wound margin is flat and intact. There is large (67-100%) pink granulation within the wound bed. There is no necrotic tissue within the wound bed. The periwound skin appearance exhibited: Scarring, Moist. The periwound skin appearance did not exhibit: Callus, Crepitus, Excoriation, Fluctuance, Friable, Induration, Localized Edema, Rash, Dry/Scaly, Maceration, Atrophie Blanche, Cyanosis, Ecchymosis, Hemosiderin Staining, Mottled, Pallor, Rubor, Erythema. Periwound temperature was noted as No Abnormality. The periwound has tenderness on palpation. Assessment Active Problems ICD-10 L89.114 - Pressure ulcer of right upper back, stage 4 L89.154 - Pressure ulcer of sacral region, stage 4 G82.52 - Quadriplegia, C1-C4 incomplete E44.0 - Moderate protein-calorie malnutrition Plan Wound Cleansing: Wound #1 Right Scapula: Cleanse wound with mild soap and water May Shower, gently pat wound dry prior to applying new dressing. Wound #2 Midline Sacrum: Cleanse wound with mild soap and water May Shower, gently pat wound dry prior to applying new  dressing. Primary Wound Dressing: Wound #1 Right Scapula: Other: - Sorbact - lightly pack undermining (11-5) Wound #2 Midline Sacrum: Aquacel Ag - rope Secondary Dressing: Wound #1 Right Scapula: Boardered Foam Dressing [name], Miarose [name] ([id number]) Wound #2 Midline Sacrum: Boardered Foam Dressing Dressing Change Frequency: Wound #1 Right Scapula: Change dressing every other day. Wound #2 Midline Sacrum: Change dressing every other day. Follow-up Appointments: Wound #1 Right Scapula: Return Appointment in 2 weeks. Wound #2 Midline Sacrum: Return Appointment in 2 weeks. Off-Loading: Wound #1 Right Scapula: Turn and reposition every 2 hours Wound #2 Midline Sacrum: Turn and reposition every 2 hours Home Health: Wound #1 Right Scapula: Continue Home Health Visits - [facility] Home Health Nurse may visit PRN to address patient s wound care needs. FACE TO FACE ENCOUNTER: MEDICARE and MEDICAID PATIENTS: I certify that this patient is under my care and that I had a face-to-face encounter that meets the physician face-to-face encounter requirements with this patient on this date. The encounter with the patient was in whole or in part for the following MEDICAL CONDITION: (primary reason for Home Healthcare) MEDICAL NECESSITY: I certify, that based on my findings, NURSING services are a medically necessary home health service. HOME BOUND STATUS: I certify that my clinical findings support that this patient is homebound (i.e., Due to illness or injury, pt requires aid of supportive devices such as crutches, cane, wheelchairs, walkers, the use of special transportation or the assistance of another person to leave their place of residence. There is a normal inability to leave the home and doing so requires considerable and taxing effort. Other absences are for medical reasons / religious services and are infrequent or of short duration when for other reasons). If current  dressing causes regression in wound condition, may D/C ordered dressing product/s and apply Normal Saline Moist Dressing daily until next Wound Healing Center / Other MD appointment. Notify Wound Healing Center of regression in wound condition at [phone]. Please direct any NON-WOUND related issues/requests for orders to patient's Primary Care Physician Wound #2 Midline Sacrum: Continue Home Health Visits - [facility] Home Health Nurse may visit PRN to address patient s wound care needs. FACE TO FACE ENCOUNTER: MEDICARE and MEDICAID PATIENTS: I certify that this patient is under my care and that I had a face-to-face encounter that meets the physician face-to-face encounter requirements with this patient on this date. The encounter with the patient was in whole or in part for the following MEDICAL CONDITION: (primary reason for Home Healthcare) MEDICAL NECESSITY: I certify,  that based on my findings, NURSING services are a medically necessary home health service. HOME BOUND STATUS: I certify that my clinical findings support that this patient is homebound (i.e., Due to illness or injury, pt requires aid of supportive devices such as crutches, cane, wheelchairs, walkers, the use of special transportation or the assistance of another person to leave their place of residence. There is a normal inability to leave the home and doing so requires considerable and taxing effort. Other absences are for medical reasons / religious services and are infrequent or of short duration when for other reasons). If current dressing causes regression in wound condition, may D/C ordered dressing product/s and apply Normal Saline Moist Dressing daily until next Wound Healing Center / Other MD appointment. Notify Wound Healing Center of regression in wound condition at [phone]. ERDINE, HULEN. ([id number]) Please direct any NON-WOUND related issues/requests for orders to patient's Primary Care  Physician I have recommended we packed the wound with Sorbact, and we continue to offload and support with nutrition and vitamins. She is again urged to have a repeat appointment with [facility] plastic surgery group. Electronic Signature(s) Signed: 05/20/2015 11:01:04 AM By: [name] Kanner MD, FACS Entered By: [name] on 05/20/2015 11:01:04 [name] ([id number]) -------------------------------------------------------------------------------- SuperBill Details Patient Name: DAYAMI, TAITT. Date of Service: 05/20/2015 Medical Record Number: [id number] Patient Account Number: [id number] Date of Birth/Sex: 1965/11/19 (49 y.o. 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, C1-C4 incomplete E44.0 Moderate protein-calorie malnutrition Facility Procedures CPT4 Code: 29528413 Description: 99213 - WOUND CARE VISIT-LEV 3 EST PT Modifier: Quantity: 1 Physician Procedures CPT4 Code: 2440102 Description: 99213 - WC PHYS LEVEL 3 - EST PT ICD-10 Description Diagnosis 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

## 2015-06-03 ENCOUNTER — Ambulatory Visit: Payer: Medicare Other | Admitting: Surgery

## 2015-06-10 ENCOUNTER — Encounter: Payer: Medicare Other | Attending: Surgery | Admitting: Surgery

## 2015-06-10 DIAGNOSIS — G8252 Quadriplegia, C1-C4 incomplete: Secondary | ICD-10-CM | POA: Diagnosis not present

## 2015-06-10 DIAGNOSIS — L89154 Pressure ulcer of sacral region, stage 4: Secondary | ICD-10-CM | POA: Insufficient documentation

## 2015-06-10 DIAGNOSIS — L89114 Pressure ulcer of right upper back, stage 4: Secondary | ICD-10-CM | POA: Diagnosis present

## 2015-06-10 DIAGNOSIS — E440 Moderate protein-calorie malnutrition: Secondary | ICD-10-CM | POA: Insufficient documentation

## 2015-06-10 DIAGNOSIS — Z87891 Personal history of nicotine dependence: Secondary | ICD-10-CM | POA: Insufficient documentation

## 2015-06-11 NOTE — Progress Notes (Addendum)
CATALENA, STANHOPE ([id number]) Visit Report for 06/10/2015 Chief Complaint Document Details Patient Name: [name], [name] 06/10/2015 10:00 Date of Service: AM Medical Record [id number] Number: Patient Account Number: [id number] 05-Jun-1966 (49 y.o. Treating RN: [name] Haggis Date of Birth/Sex: Female) Other Clinician: Primary Care Physician: [place], [name] Treating Evlyn Kanner Referring Physician: [place], [name] Physician/Extender: Weeks in Treatment: 19 Information Obtained from: Patient Chief Complaint Patient presents to the wound care center for a consult due non healing wound. 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 She comes in for a pressure injury to the right scapular region and the sacral region. She has this for several months. Electronic Signature(s) Signed: 06/10/2015 10:07:39 AM By: Evlyn Kanner MD, FACS Entered By: Evlyn Kanner on 06/10/2015 10:07:39 [name], [name] -------------------------------------------------------------------------------- HPI Details Patient Name: [name], [name] 06/10/2015 10:00 Date of Service: AM Medical Record [id number] Number: Patient Account Number: [id number] February 19, 1966 (49 y.o. 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 For about 4-5 months she's had a decubitus ulcer on the right scapular region and she has a couple of small areas which have opened out in the region of previous scar tissue in the sacral region. He does not have any other significant comorbidities. She has a good Roho  cushion for her wheelchair and she has a specialized mattress for sleep. She has stopped smoking a year ago. 02/11/2015 -- she has not yet got her appointment with the plastic surgeons at [facility]. They did a x-ray of the sacral region this afternoon just before coming here. 02/18/2015 -- X-ray of the pelvis -- IMPRESSION:No acute osseous injury of the pelvis. If there is clinical concern regarding osteomyelitis, recommend further evaluation with CT. X-ray of the sacrum and coccyx IMPRESSION:No acute osseous abnormality of the sacrum. If there is further clinical concern regarding osteomyelitis of the sacrum, further evaluation with CT is recommended. 03/11/2015 -- she has yet to obtain the appointment with the plastic surgeons at [facility]. She says she will be diligent and follow-up on this. 03/18/2015 -- she has an appointment to see the plastic surgeons at [facility] this coming Monday. Also has some issues with her air mattress and is working on getting this replaced. 03/25/2015 -- her air mattress is working okay now and she does not yet have a firm appointment to see her plastic surgeons. 04/22/2015 -- he have not seen her for about a month and this was due to the fact that she had bronchitis, pneumothorax and was admitted to [facility]. We do not have a plastic surgery opinion as of yet. Electronic Signature(s) Signed: 06/10/2015 10:07:45 AM By: Evlyn Kanner MD, FACS Entered By: Evlyn Kanner on 06/10/2015 10:07:45 [name] Masters ([id number]) -------------------------------------------------------------------------------- Physical Exam Details Patient Name: [name], [name] 06/10/2015 10:00 Date of Service: AM Medical Record [id number] Number: Patient Account Number: [id number] [birth year] (49 y.o. Treating RN: [name] Haggis Date of Birth/Sex: Female) Other Clinician: Primary Care Physician: [place], [name] Treating Evlyn Kanner Referring Physician: [place],  [name] Physician/Extender: Weeks in Treatment: 19 Constitutional . Pulse regular. Respirations normal and unlabored. Afebrile. . Eyes Nonicteric. Reactive to light. Ears, Nose, Mouth, and Throat Lips, teeth, and gums  WNL.. Moist mucosa without lesions . Neck supple and nontender. No palpable supraclavicular or cervical adenopathy. Normal sized without goiter. Respiratory WNL. No retractions.. Cardiovascular Pedal Pulses WNL. No clubbing, cyanosis or edema. Lymphatic No adneopathy. No adenopathy. No adenopathy. Musculoskeletal Adexa without tenderness or enlargement.. Digits and nails w/o clubbing, cyanosis, infection, petechiae, ischemia, or inflammatory conditions.. Integumentary (Hair, Skin) No suspicious lesions. No crepitus or fluctuance. No peri-wound warmth or erythema. No masses.Marland Kitchen. Psychiatric Judgement and insight Intact.. No evidence of depression, anxiety, or agitation.. Notes the sacral wound is looking very good and there is no bone protruding. The right scapular is looking clean and undermining less. Electronic Signature(s) Signed: 06/10/2015 10:08:36 AM By: Evlyn KannerBritto, Bion Todorov MD, FACS Entered By: Evlyn KannerBritto, Arohi Salvatierra on 06/10/2015 10:08:36 [name], Kennita [name]. ([id number]) -------------------------------------------------------------------------------- Physician Orders Details Patient Name: [name], [name] 06/10/2015 10:00 Date of Service: AM Medical Record [id number] Number: Patient Account Number: [id number] [birth year] (49 y.o. 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 Wound #2 Midline Sacrum o Cleanse wound with mild soap and water o May Shower, gently pat wound dry prior to applying new dressing. Primary Wound Dressing Wound #1 Right Scapula o Other: - Sorbact - lightly pack undermining (11-5) Wound #2 Midline Sacrum o Aquacel Ag - rope Secondary Dressing Wound #1 Right Scapula o Boardered Foam Dressing Wound #2 Midline Sacrum o Boardered Foam Dressing Dressing Change Frequency Wound #1 Right Scapula o Change dressing every other day. [name], Aliscia [name]. ([id number]) Wound #2 Midline Sacrum o Change dressing every other day. Follow-up Appointments Wound #1 Right Scapula o Return Appointment in 2 weeks. Wound #2 Midline Sacrum o Return Appointment in 2 weeks. Off-Loading Wound #1 Right Scapula o Turn and reposition every 2 hours Wound #2 Midline Sacrum o Turn and reposition every 2 hours Home Health Wound #1 Right Scapula o Continue Home Health Visits - [facility] o Home Health Nurse may visit PRN to address patientos wound care needs. o FACE TO FACE ENCOUNTER: MEDICARE and MEDICAID PATIENTS: I certify that this patient is under my care and that I had a face-to-face encounter that meets the physician face-to-face encounter requirements with this patient on this date. The encounter with the patient was in whole or in part for the following MEDICAL CONDITION: (primary reason for Home Healthcare) MEDICAL NECESSITY: I certify, that based on my findings, NURSING services are a medically necessary home health service. HOME BOUND STATUS: I certify that my clinical findings support that this patient is homebound (i.e., Due to illness or injury, pt requires aid of supportive  devices such as crutches, cane, wheelchairs, walkers, the use of special transportation or the assistance of another person to leave their place of residence. There is a normal inability to leave the home and doing so requires considerable and taxing effort. Other absences are for medical reasons / religious services and are infrequent or of short duration when for other reasons). o If current dressing causes regression in wound condition, may D/C ordered  dressing product/s and apply Normal Saline Moist Dressing daily until next Wound Healing Center / Other MD appointment. Notify Wound Healing Center of regression in wound condition at [phone]. o Please direct any NON-WOUND related issues/requests for orders to patient's Primary Care Physician Wound #2 Midline Sacrum o Continue Home Health Visits - [facility] o Home Health Nurse may visit PRN to address patientos wound care needs. o FACE TO FACE ENCOUNTER: MEDICARE and MEDICAID PATIENTS: I certify that this patient is under my care and that I had a face-to-face encounter that meets the physician face-to-face encounter requirements with this patient on this date. The encounter with the patient was in whole or in part for the following MEDICAL CONDITION: (primary reason for Home Healthcare) MEDICAL NECESSITY: I certify, that based on my findings, NURSING services are a medically JARETZY, LHOMMEDIEU. ([id number]) necessary home health service. HOME BOUND STATUS: I certify that my clinical findings support that this patient is homebound (i.e., Due to illness or injury, pt requires aid of supportive devices such as crutches, cane, wheelchairs, walkers, the use of special transportation or the assistance of another person to leave their place of residence. There is a normal inability to leave the home and doing so requires considerable and taxing effort. Other absences are for medical reasons / religious services and are  infrequent or of short duration when for other reasons). o If current dressing causes regression in wound condition, may D/C ordered dressing product/s and apply Normal Saline Moist Dressing daily until next Wound Healing Center / Other MD appointment. Notify Wound Healing Center of regression in wound condition at [phone]. o Please direct any NON-WOUND related issues/requests for orders to patient's Primary Care Physician Electronic Signature(s) Signed: 06/10/2015 3:39:43 PM By: Evlyn Kanner MD, FACS Signed: 06/15/2015 3:59:50 PM By: Alejandro Mulling Entered By: Alejandro Mulling on 06/10/2015 10:16:40 Rossbach, [name] ([id number]) -------------------------------------------------------------------------------- Problem List Details Patient Name: [name], [name] 06/10/2015 10:00 Date of Service: AM Medical Record [id number] Number: Patient Account Number: [id number] 1966/01/15 (49 y.o. 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 06/10/2015 10:00 Date of Service: AM Medical Record [id number] Number: Patient Account Number: [id number] [birth year] (49 y.o. Treating RN:  [name] Haggis Date of Birth/Sex: Female) Other Clinician: Primary Care Physician: [place], [name] Treating Evlyn Kanner Referring Physician: [place], [name] Physician/Extender: Weeks in Treatment: 19 Subjective Chief Complaint Information obtained from Patient Patient presents to the wound care center for a consult due non healing wound. 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 She comes in for a pressure injury to the right scapular region and the sacral region. She has this for several months. 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 For about 4-5 months she's had a decubitus ulcer on the right scapular region and  she has a couple of small areas which have opened out in the region of previous scar tissue in the sacral region. He does not have any other significant comorbidities. She has a good Roho cushion for her wheelchair and she has a specialized mattress for sleep. She has stopped smoking a year ago. 02/11/2015 -- she has not yet got her appointment with the plastic surgeons at [facility]. They did a x-ray of the sacral region this afternoon just before coming here. 02/18/2015 -- X-ray of the pelvis -- IMPRESSION:No acute osseous injury of the pelvis. If there is clinical concern regarding osteomyelitis, recommend further evaluation with CT. X-ray of the sacrum and coccyx IMPRESSION:No acute osseous abnormality of the sacrum. If there is further clinical concern regarding osteomyelitis of the sacrum, further evaluation with CT is recommended. 03/11/2015 -- she has yet to obtain the appointment with the plastic surgeons at [facility]. She says she will be diligent and follow-up on this. 03/18/2015 -- she has an appointment to see the plastic surgeons at [facility] this coming  Monday. Also has some issues with her air mattress and is working on getting this replaced. 03/25/2015 -- her air mattress is working okay now and she does not yet have a firm appointment to see her plastic surgeons. 04/22/2015 -- he have not seen her for about a month and this was due to the fact that she had bronchitis, pneumothorax and was admitted to [facility]. We do not have a plastic surgery opinion as of yet. AIDALY, CORDNER [name]. ([id number]) Objective Constitutional Pulse regular. Respirations normal and unlabored. Afebrile. Vitals Time Taken: 9:33 AM, Height: 63 in, Weight: 100 lbs, BMI: 17.7, Temperature: 97.8 F, Pulse: 79 bpm, Respiratory Rate: 18 breaths/min, Blood Pressure: 88/57 mmHg. Eyes Nonicteric. Reactive to light. Ears, Nose, Mouth, and Throat Lips, teeth, and gums WNL.Marland Kitchen Moist mucosa without lesions . Neck supple and nontender. No palpable supraclavicular or cervical adenopathy. Normal sized without goiter. Respiratory WNL. No retractions.. Cardiovascular Pedal Pulses WNL. No clubbing, cyanosis or edema. Lymphatic No adneopathy. No adenopathy. No adenopathy. Musculoskeletal Adexa without tenderness or enlargement.. Digits and nails w/o clubbing, cyanosis, infection, petechiae, ischemia, or inflammatory conditions.Marland Kitchen Psychiatric Judgement and insight Intact.. No evidence of depression, anxiety, or agitation.. General Notes: the sacral wound is looking very good and there is no bone protruding. The right scapular is looking clean and undermining less. Integumentary (Hair, Skin) No suspicious lesions. No crepitus or fluctuance. No peri-wound warmth or erythema. No masses.. Wound #1 status is Open. Original cause of wound was Pressure Injury. The wound is located on the Right Scapula. The wound measures 1.1cm length x 1cm width x 0.5cm depth; 0.864cm^2 area and 0.432cm^3 volume. There is bone exposed. There is no tunneling noted, however, there is undermining  starting at 11:00 and ending at 1:00 with a maximum distance of 1.7cm. There is a large amount of serosanguineous drainage noted. The wound margin is flat and intact. There is large (67-100%) red, pink granulation within the wound bed. There is no necrotic tissue within the wound bed. The periwound skin appearance OANH, DEVIVO. ([id number]) exhibited: Localized Edema, Maceration, Moist. The periwound skin appearance did not exhibit: Callus, Crepitus, Excoriation, Fluctuance, Friable, Induration, Rash, Scarring, Dry/Scaly, Atrophie Blanche, Cyanosis, Ecchymosis, Hemosiderin Staining, Mottled, Pallor, Rubor, Erythema. Periwound temperature was noted as No Abnormality. The periwound has tenderness on palpation. Wound #2 status is Open. Original cause of wound was Pressure Injury. The wound is located on the Midline Sacrum. The wound  measures 0.2cm length x 1.8cm width x 0.1cm depth; 0.283cm^2 area and 0.028cm^3 volume. There is no tunneling or undermining noted. There is a medium amount of serous drainage noted. The wound margin is flat and intact. There is large (67-100%) pink granulation within the wound bed. There is no necrotic tissue within the wound bed. The periwound skin appearance exhibited: Scarring, Moist. The periwound skin appearance did not exhibit: Callus, Crepitus, Excoriation, Fluctuance, Friable, Induration, Localized Edema, Rash, Dry/Scaly, Maceration, Atrophie Blanche, Cyanosis, Ecchymosis, Hemosiderin Staining, Mottled, Pallor, Rubor, Erythema. Periwound temperature was noted as No Abnormality. The periwound has tenderness on palpation. Assessment Active Problems ICD-10 L89.114 - Pressure ulcer of right upper back, stage 4 L89.154 - Pressure ulcer of sacral region, stage 4 G82.52 - Quadriplegia, C1-C4 incomplete E44.0 - Moderate protein-calorie malnutrition Plan Wound Cleansing: Wound #1 Right Scapula: Cleanse wound with mild soap and water May Shower, gently pat  wound dry prior to applying new dressing. Wound #2 Midline Sacrum: Cleanse wound with mild soap and water May Shower, gently pat wound dry prior to applying new dressing. Primary Wound Dressing: Wound #1 Right Scapula: Other: - Sorbact - lightly pack undermining (11-5) Wound #2 Midline Sacrum: Aquacel Ag - rope Secondary Dressing: Wound #1 Right Scapula: Boardered Foam Dressing Curci, Jheri [name]. ([id number]) Wound #2 Midline Sacrum: Boardered Foam Dressing Dressing Change Frequency: Wound #1 Right Scapula: Change dressing every other day. Wound #2 Midline Sacrum: Change dressing every other day. Follow-up Appointments: Wound #1 Right Scapula: Return Appointment in 2 weeks. Wound #2 Midline Sacrum: Return Appointment in 2 weeks. Off-Loading: Wound #1 Right Scapula: Turn and reposition every 2 hours Wound #2 Midline Sacrum: Turn and reposition every 2 hours Home Health: Wound #1 Right Scapula: Continue Home Health Visits - [facility] Home Health Nurse may visit PRN to address patient s wound care needs. FACE TO FACE ENCOUNTER: MEDICARE and MEDICAID PATIENTS: I certify that this patient is under my care and that I had a face-to-face encounter that meets the physician face-to-face encounter requirements with this patient on this date. The encounter with the patient was in whole or in part for the following MEDICAL CONDITION: (primary reason for Home Healthcare) MEDICAL NECESSITY: I certify, that based on my findings, NURSING services are a medically necessary home health service. HOME BOUND STATUS: I certify that my clinical findings support that this patient is homebound (i.e., Due to illness or injury, pt requires aid of supportive devices such as crutches, cane, wheelchairs, walkers, the use of special transportation or the assistance of another person to leave their place of residence. There is a normal inability to leave the home and doing so requires considerable  and taxing effort. Other absences are for medical reasons / religious services and are infrequent or of short duration when for other reasons). If current dressing causes regression in wound condition, may D/C ordered dressing product/s and apply Normal Saline Moist Dressing daily until next Wound Healing Center / Other MD appointment. Notify Wound Healing Center of regression in wound condition at [phone]. Please direct any NON-WOUND related issues/requests for orders to patient's Primary Care Physician Wound #2 Midline Sacrum: Continue Home Health Visits - [facility] Home Health Nurse may visit PRN to address patient s wound care needs. FACE TO FACE ENCOUNTER: MEDICARE and MEDICAID PATIENTS: I certify that this patient is under my care and that I had a face-to-face encounter that meets the physician face-to-face encounter requirements with this patient on this date. The encounter with the  patient was in whole or in part for the following MEDICAL CONDITION: (primary reason for Home Healthcare) MEDICAL NECESSITY: I certify, that based on my findings, NURSING services are a medically necessary home health service. HOME BOUND STATUS: I certify that my clinical findings support that this patient is homebound (i.e., Due to illness or injury, pt requires aid of supportive devices such as crutches, cane, wheelchairs, walkers, the use of special transportation or the assistance of another person to leave their place of residence. There is a normal inability to leave the home and doing so requires considerable and taxing effort. Other absences are for medical reasons / religious services and are infrequent or of short duration when for other reasons). If current dressing causes regression in wound condition, may D/C ordered dressing product/s and apply Normal Saline Moist Dressing daily until next Wound Healing Center / Other MD appointment. Notify Wound Healing Center of regression in  wound condition at [phone]. MAJESTIC, MOLONY. ([id number]) Please direct any NON-WOUND related issues/requests for orders to patient's Primary Care Physician I have recommended we pack the wound with Sorbact, and we continue to offload and support with nutrition and vitamins. She is again urged to have a repeat appointment with [facility] plastic surgery group. Electronic Signature(s) Signed: 06/10/2015 3:52:45 PM By: Evlyn Kanner MD, FACS Previous Signature: 06/10/2015 3:52:35 PM Version By: Evlyn Kanner MD, FACS Previous Signature: 06/10/2015 10:09:17 AM Version By: Evlyn Kanner MD, FACS Entered By: Evlyn Kanner on 06/10/2015 15:52:45 Schmid, [name] ([id number]) -------------------------------------------------------------------------------- SuperBill Details Patient Name: [name], [name] Date of Service: 06/10/2015 Medical Record Number: [id number] Patient Account Number: [id number] Date of Birth/Sex: May 18, 1966 (48 y.o. 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, C1-C4 incomplete E44.0 Moderate protein-calorie malnutrition Facility Procedures CPT4 Code: [id number] Description: [phone] - WOUND CARE VISIT-LEV 2 EST PT Modifier: Quantity: 1 Physician Procedures CPT4 Code: [id number] Description: 99213 - WC PHYS LEVEL 3 - EST PT ICD-10 Description Diagnosis L89.114 Pressure ulcer of right upper back, stage 4 L89.154 Pressure ulcer of sacral region, stage 4 G82.52 Quadriplegia, C1-C4 incomplete E44.0 Moderate protein-calorie  malnutrition Modifier: Quantity: 1 Electronic Signature(s) Signed: 06/11/2015 4:13:31 PM By: Evlyn Kanner MD, FACS Signed: 06/15/2015  3:59:50 PM By: Alejandro Mulling Previous Signature: 06/10/2015 10:25:51 AM Version By: Evlyn Kanner MD, FACS Entered By: Alejandro Mulling on 06/10/2015 17:26:46

## 2015-06-16 NOTE — Progress Notes (Signed)
TIMIKA, MUENCH ([id number]) Visit Report for 06/10/2015 Arrival Information Details Patient Name: [name], [name] Date of Service: 06/10/2015 10:00 AM Medical Record Number: [id number] Patient Account Number: [id number] Date of Birth/Sex: 1966-02-05 (49 y.o. Female) Treating RN: Ashok Cordia, Debi Primary Care Physician: [place], JULIE Other Clinician: Referring Physician: [place], JULIE Treating Physician/Extender: Rudene Re in Treatment: 19 Visit Information History Since Last Visit All ordered tests and consults were completed: No Patient Arrived: Wheel Chair Added or deleted any medications: No Arrival Time: 09:31 Any new allergies or adverse reactions: No Accompanied By: significant other Had a fall or experienced change in No activities of daily living that may affect Transfer Assistance: Other risk of falls: Patient Identification Verified: Yes Signs or symptoms of abuse/neglect since last No Secondary Verification Process Yes visito Completed: Hospitalized since last visit: No Patient Requires Transmission- No Pain Present Now: Yes Based Precautions: Patient Has Alerts: No Electronic Signature(s) Signed: 06/15/2015 3:59:50 PM By: Alejandro Mulling Entered By: Alejandro Mulling on 06/10/2015 09:32:32 Splawn, [name] ([id number]) -------------------------------------------------------------------------------- Clinic Level of Care Assessment Details Patient Name: [name] Date of Service: 06/10/2015 10:00 AM Medical Record Number: [id number] Patient Account Number: [id number] Date of Birth/Sex: 1966/02/09 (49 y.o. Female) Treating RN: Ashok Cordia, Debi Primary Care Physician: [place], JULIE Other Clinician: Referring Physician: [place], JULIE Treating Physician/Extender: Rudene Re in Treatment: 19 Clinic Level of Care Assessment Items TOOL 4 Quantity Score  - Use when only an EandM is performed on FOLLOW-UP visit 0 ASSESSMENTS - Nursing  Assessment / Reassessment  - Reassessment of Co-morbidities (includes updates in patient status) 0 X - Reassessment of Adherence to Treatment Plan 1 5 ASSESSMENTS - Wound and Skin Assessment / Reassessment  - Simple Wound Assessment / Reassessment - one wound 0 X - Complex Wound Assessment / Reassessment - multiple wounds 1 5  - Dermatologic / Skin Assessment (not related to wound area) 0 ASSESSMENTS - Focused Assessment  - Circumferential Edema Measurements - multi extremities 0  - Nutritional Assessment / Counseling / Intervention 0  - Lower Extremity Assessment (monofilament, tuning fork, pulses) 0  - Peripheral Arterial Disease Assessment (using hand held doppler) 0 ASSESSMENTS - Ostomy and/or Continence Assessment and Care  - Incontinence Assessment and Management 0  - Ostomy Care Assessment and Management (repouching, etc.) 0 PROCESS - Coordination of Care X - Simple Patient / Family Education for ongoing care 1 15  - Complex (extensive) Patient / Family Education for ongoing care 0  - Staff obtains Chiropractor, Records, Test Results / Process Orders 0  - Staff telephones HHA, Nursing Homes / Clarify orders / etc 0  - Routine Transfer to another Facility (non-emergent condition) 0 Touhey, [name] ([id number])  - Routine Hospital Admission (non-emergent condition) 0  - New Admissions / Manufacturing engineer / Ordering NPWT, Apligraf, etc. 0  - Emergency Hospital Admission (emergent condition) 0 X - Simple Discharge Coordination 1 10  - Complex (extensive) Discharge Coordination 0 PROCESS - Special Needs  - Pediatric / Minor Patient Management 0  - Isolation Patient Management 0  - Hearing / Language / Visual special needs 0  - Assessment of Community assistance (transportation, D/C planning, etc.) 0  - Additional assistance / Altered mentation 0  - Support Surface(s) Assessment (bed, cushion, seat, etc.) 0 INTERVENTIONS - Wound  Cleansing / Measurement  - Simple Wound Cleansing - one wound 0  - Complex Wound Cleansing - multiple wounds 0  - Wound Imaging (photographs - any number of wounds) 0  -  Wound Tracing (instead of photographs) 0  - Simple Wound Measurement - one wound 0  - Complex Wound Measurement - multiple wounds 0 INTERVENTIONS - Wound Dressings X - Small Wound Dressing one or multiple wounds 1 10  - Medium Wound Dressing one or multiple wounds 0  - Large Wound Dressing one or multiple wounds 0  - Application of Medications - topical 0  - Application of Medications - injection 0 INTERVENTIONS - Miscellaneous  - External ear exam 0 [name], [name] ([id number])  - Specimen Collection (cultures, biopsies, blood, body fluids, etc.) 0  - Specimen(s) / Culture(s) sent or taken to Lab for analysis 0  - Patient Transfer (multiple staff / Michiel Sites Lift / Similar devices) 0  - Simple Staple / Suture removal (25 or less) 0  - Complex Staple / Suture removal (26 or more) 0  - Hypo / Hyperglycemic Management (close monitor of Blood Glucose) 0  - Ankle / Brachial Index (ABI) - do not check if billed separately 0 X - Vital Signs 1 5 Has the patient been seen at the hospital within the last three years: Yes Total Score: 50 Level Of Care: New/Established - Level 2 Electronic Signature(s) Signed: 06/15/2015 3:59:50 PM By: Alejandro Mulling Entered By: Alejandro Mulling on 06/10/2015 17:26:00 Alkema, [name] ([id number]) -------------------------------------------------------------------------------- Encounter Discharge Information Details Patient Name: [name], [name] Date of Service: 06/10/2015 10:00 AM Medical Record Number: [id number] Patient Account Number: [id number] Date of Birth/Sex: December 27, 1965 (49 y.o. Female) Treating RN: Ashok Cordia, Debi Primary Care Physician: [place], JULIE Other Clinician: Referring Physician: [place], JULIE Treating Physician/Extender: Rudene Re in Treatment: 64 Encounter Discharge Information Items Discharge Pain Level: 0 Discharge Condition: Stable Ambulatory Status: Wheelchair Discharge Destination: Home Transportation: Private Auto significant Accompanied By: other Schedule Follow-up Appointment: Yes Medication Reconciliation completed Yes and provided to Patient/Care Shallyn Constancio: Clinical Summary of Care: Electronic Signature(s) Signed: 06/15/2015 3:59:50 PM By: Alejandro Mulling Entered By: Alejandro Mulling on 06/10/2015 10:17:50 Wysocki, [name] ([id number]) -------------------------------------------------------------------------------- Lower Extremity Assessment Details Patient Name: [name] Date of Service: 06/10/2015 10:00 AM Medical Record Number: [id number] Patient Account Number: [id number] Date of Birth/Sex: 08/25/1965 (49 y.o. Female) Treating RN: Ashok Cordia, Debi Primary Care Physician: [place], JULIE Other Clinician: Referring Physician: [place], JULIE Treating Physician/Extender: Rudene Re in Treatment: 19 Electronic Signature(s) Signed: 06/15/2015 3:59:50 PM By: Alejandro Mulling Entered By: Alejandro Mulling on 06/10/2015 09:38:03 Santiago, [name] ([id number]) -------------------------------------------------------------------------------- Multi Wound Chart Details Patient Name: [name], [name] Date of Service: 06/10/2015 10:00 AM Medical Record Number: [id number] Patient Account Number: [id number] Date of Birth/Sex: 01-25-1966 (49 y.o. Female) Treating RN: Ashok Cordia, Debi Primary Care Physician: [place], JULIE Other Clinician: Referring Physician: [place], JULIE Treating Physician/Extender: Rudene Re in Treatment: 19 Vital Signs Height(in): 63 Pulse(bpm): 79 Weight(lbs): 100 Blood Pressure 88/57 (mmHg): Body Mass Index(BMI): 18 Temperature(F): 97.8 Respiratory Rate 18 (breaths/min): Photos: [1:No Photos] [2:No Photos] [N/A:N/A] Wound Location:  [1:Right Scapula] [2:Sacrum - Midline] [N/A:N/A] Wounding Event: [1:Pressure Injury] [2:Pressure Injury] [N/A:N/A] Primary Etiology: [1:Pressure Ulcer] [2:Pressure Ulcer] [N/A:N/A] Comorbid History: [1:Asthma, Chronic Obstructive Pulmonary Disease (COPD), Paraplegia] [2:Asthma, Chronic Obstructive Pulmonary Disease (COPD), Paraplegia] [N/A:N/A] Date Acquired: [1:09/21/2014] [2:12/02/2007] [N/A:N/A] Weeks of Treatment: [1:19] [2:19] [N/A:N/A] Wound Status: [1:Open] [2:Open] [N/A:N/A] Measurements L x W x D 1.1x1x0.5 [2:0.2x1.8x0.1] [N/A:N/A] (cm) Area (cm) : [1:0.864] [2:0.283] [N/A:N/A] Volume (cm) : [1:0.432] [2:0.028] [N/A:N/A] % Reduction in Area: [1:29.50%] [2:62.50%] [N/A:N/A] % Reduction in Volume: 55.90% [2:62.70%] [N/A:N/A] Starting Position 1 11 (o'clock): Ending Position 1 [1:1] (o'clock): Maximum Distance  1 1.7 (cm): Undermining: [1:Yes] [2:No] [N/A:N/A] 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] [N/A:N/A] Exudate Amount: [1:Large] [2:Medium] [N/A:N/A] Exudate Type: [1:Serosanguineous] [2:Serous] [N/A:N/A] Exudate Color: [1:red, brown] [2:amber] [N/A:N/A] Foul Odor After [1:Yes] [2:No] [N/A:N/A] Cleansing: MAANSI, WIKE. ([id number]) Odor Anticipated Due to No N/A N/A Product Use: Wound Margin: Flat and Intact Flat and Intact N/A Granulation Amount: Large (67-100%) Large (67-100%) N/A Granulation Quality: Red, Pink Pink N/A Necrotic Amount: None Present (0%) None Present (0%) N/A Exposed Structures: Bone: Yes Fascia: No N/A Fascia: No Fat: No Fat: No Tendon: No Tendon: No Muscle: No Muscle: No Joint: No Joint: No Bone: No Epithelialization: None Large (67-100%) N/A Periwound Skin Texture: Edema: Yes Scarring: Yes N/A Excoriation: No Edema: No Induration: No Excoriation: No Callus: No Induration: No Crepitus: No Callus: No Fluctuance: No Crepitus: No Friable: No Fluctuance: No Rash: No Friable: No Scarring: No Rash:  No Periwound Skin Maceration: Yes Moist: Yes N/A Moisture: Moist: Yes Maceration: No Dry/Scaly: No Dry/Scaly: No Periwound Skin Color: Atrophie Blanche: No Atrophie Blanche: No N/A Cyanosis: No Cyanosis: No Ecchymosis: No Ecchymosis: No Erythema: No Erythema: No Hemosiderin Staining: No Hemosiderin Staining: No Mottled: No Mottled: No Pallor: No Pallor: No Rubor: No Rubor: No Temperature: No Abnormality No Abnormality N/A Tenderness on Yes Yes N/A Palpation: Wound Preparation: Ulcer Cleansing: Ulcer Cleansing: N/A Rinsed/Irrigated with Rinsed/Irrigated with Saline Saline Topical Anesthetic Topical Anesthetic Applied: Other: lidocaine Applied: Other: lidocaine 4% 4% Treatment Notes Electronic Signature(s) Signed: 06/15/2015 3:59:50 PM By: Alejandro Mulling Entered By: Alejandro Mulling on 06/10/2015 09:55:57 Murata, CORRETTA MUNCE ([id number]) Moxon, [name] -------------------------------------------------------------------------------- Multi-Disciplinary Care Plan Details Patient Name: [name], [name] Date of Service: 06/10/2015 10:00 AM Medical Record Number: [id number] Patient Account Number: [id number] Date of Birth/Sex: 1965-10-04 (49 y.o. Female) Treating RN: Ashok Cordia, Debi Primary Care Physician: [place], JULIE Other Clinician: Referring Physician: [place], JULIE Treating Physician/Extender: Rudene Re in Treatment: 75 Active Inactive Abuse / Safety / Falls / Self Care Management Nursing Diagnoses: Impaired home maintenance Impaired physical mobility Potential for falls Self care deficit: actual or potential Goals: Patient/caregiver will verbalize understanding of skin care regimen Date Initiated: 01/27/2015 Goal Status: Active Patient/caregiver will verbalize/demonstrate measure taken to improve self care Date Initiated: 01/27/2015 Goal Status: Active Patient/caregiver will verbalize/demonstrate measures taken to improve the  patient's personal safety Date Initiated: 01/27/2015 Goal Status: Active Patient/caregiver will verbalize/demonstrate measures taken to prevent injury and/or falls Date Initiated: 01/27/2015 Goal Status: Active Patient/caregiver will verbalize/demonstrate understanding of what to do in case of emergency Date Initiated: 01/27/2015 Goal Status: Active Interventions: Assess fall risk on admission and as needed Assess: immobility, friction, shearing, incontinence upon admission and as needed Assess impairment of mobility on admission and as needed per policy Assess self care needs on admission and as needed Provide education on basic hygiene Provide education on fall prevention Provide education on personal and home safety Provide education on safe transfers [name], [name] ([id number]) Treatment Activities: Education provided on Basic Hygiene : 02/04/2015 Notes: Orientation to the Wound Care Program Nursing Diagnoses: Knowledge deficit related to the wound healing center program Goals: Patient/caregiver will verbalize understanding of the Wound Healing Center Program Date Initiated: 01/27/2015 Goal Status: Active Interventions: Provide education on orientation to the wound center Notes: Pressure Nursing Diagnoses: Knowledge deficit related to causes and risk factors for pressure ulcer development Knowledge deficit related to management of pressures ulcers Potential for impaired tissue integrity related to pressure, friction, moisture, and shear Goals: Patient will remain free from development of additional pressure  ulcers Date Initiated: 01/27/2015 Goal Status: Active Patient will remain free of pressure ulcers Date Initiated: 01/27/2015 Goal Status: Active Patient/caregiver will verbalize risk factors for pressure ulcer development Date Initiated: 01/27/2015 Goal Status: Active Patient/caregiver will verbalize understanding of pressure ulcer management Date Initiated:  01/27/2015 Goal Status: Active Interventions: Assess: immobility, friction, shearing, incontinence upon admission and as needed Assess offloading mechanisms upon admission and as needed Assess potential for pressure ulcer upon admission and as needed Provide education on pressure ulcers [name], [name] ([id number]) Treatment Activities: Patient referred for pressure reduction/relief devices : 06/10/2015 Patient referred for seating evaluation to ensure proper offloading : 06/10/2015 Notes: Wound/Skin Impairment Nursing Diagnoses: Impaired tissue integrity Knowledge deficit related to smoking impact on wound healing Knowledge deficit related to ulceration/compromised skin integrity Goals: Patient/caregiver will verbalize understanding of skin care regimen Date Initiated: 01/27/2015 Goal Status: Active Ulcer/skin breakdown will have a volume reduction of 30% by week 4 Date Initiated: 01/27/2015 Goal Status: Active Ulcer/skin breakdown will have a volume reduction of 50% by week 8 Date Initiated: 01/27/2015 Goal Status: Active Ulcer/skin breakdown will have a volume reduction of 80% by week 12 Date Initiated: 01/27/2015 Goal Status: Active Ulcer/skin breakdown will heal within 14 weeks Date Initiated: 01/27/2015 Goal Status: Active Interventions: Assess ulceration(s) every visit Provide education on ulcer and skin care Notes: Electronic Signature(s) Signed: 06/15/2015 3:59:50 PM By: Alejandro Mulling Entered By: Alejandro Mulling on 06/10/2015 09:55:51 Collier, Durene M. ([id number]) -------------------------------------------------------------------------------- Pain Assessment Details Patient Name: [name] Date of Service: 06/10/2015 10:00 AM Medical Record Number: [id number] Patient Account Number: [id number] Date of Birth/Sex: July 26, 1965 (49 y.o. Female) Treating RN: Ashok Cordia, Debi Primary Care Physician: [place], JULIE Other Clinician: Referring Physician: [place],  JULIE Treating Physician/Extender: Rudene Re in Treatment: 28 Active Problems Location of Pain Severity and Description of Pain Patient Has Paino Yes Site Locations Pain Location: Pain in Ulcers With Dressing Change: Yes Duration of the Pain. Constant / Intermittento Constant Rate the pain. Current Pain Level: 8 Worst Pain Level: 8 Least Pain Level: 0 Character of Pain Describe the Pain: Sharp Pain Management and Medication Current Pain Management: Electronic Signature(s) Signed: 06/15/2015 3:59:50 PM By: Alejandro Mulling Entered By: Alejandro Mulling on 06/10/2015 09:33:02 Brinton, [name] ([id number]) -------------------------------------------------------------------------------- Patient/Caregiver Education Details Patient Name: [name], [name] Date of Service: 06/10/2015 10:00 AM Medical Record Number: [id number] Patient Account Number: [id number] Date of Birth/Gender: [birth year] (49 y.o. Female) Treating RN: Ashok Cordia, Debi Primary Care Physician: [place], JULIE Other Clinician: Referring Physician: [place], JULIE Treating Physician/Extender: Rudene Re in Treatment: 21 Education Assessment Education Provided To: Patient and Caregiver Education Topics Provided Wound/Skin Impairment: Handouts: Other: change dressing as ordered Methods: Demonstration, Explain/Verbal Responses: State content correctly Electronic Signature(s) Signed: 06/15/2015 3:59:50 PM By: Alejandro Mulling Entered By: Alejandro Mulling on 06/10/2015 10:18:09 [name], [name] ([id number]) -------------------------------------------------------------------------------- Wound Assessment Details Patient Name: [name] Date of Service: 06/10/2015 10:00 AM Medical Record Number: [id number] Patient Account Number: [id number] Date of Birth/Sex: January 07, 1966 (49 y.o. Female) Treating RN: Ashok Cordia, Debi Primary Care Physician: [place], JULIE Other Clinician: Referring  Physician: [place], JULIE Treating Physician/Extender: Rudene Re in Treatment: 19 Wound Status Wound Number: 1 Primary Pressure Ulcer Etiology: Wound Location: Right Scapula Wound Open Wounding Event: Pressure Injury Status: Date Acquired: 09/21/2014 Comorbid Asthma, Chronic Obstructive Weeks Of Treatment: 19 History: Pulmonary Disease (COPD), Clustered Wound: No Paraplegia Photos Photo Uploaded By: Elliot Gurney, RN, BSN, Kim on 06/10/2015 17:33:52 Wound Measurements Length: (cm) 1.1 Width: (cm) 1 Depth: (cm) 0.5 Area: (cm)  0.864 Volume: (cm) 0.432 % Reduction in Area: 29.5% % Reduction in Volume: 55.9% Epithelialization: None Tunneling: No Undermining: Yes Starting Position (o'clock): 11 Ending Position (o'clock): 1 Maximum Distance: (cm) 1.7 Wound Description Classification: Category/Stage IV Wound Margin: Flat and Intact Exudate Amount: Large Exudate Type: Serosanguineous Exudate Color: red, brown Foul Odor After Cleansing: Yes Due to Product Use: No Wound Bed Zajicek, [name] ([id number]) Granulation Amount: Large (67-100%) Exposed Structure Granulation Quality: Red, Pink Fascia Exposed: No Necrotic Amount: None Present (0%) Fat Layer Exposed: No Tendon Exposed: No Muscle Exposed: No Joint Exposed: No Bone Exposed: Yes Periwound Skin Texture Texture Color No Abnormalities Noted: No No Abnormalities Noted: No Callus: No Atrophie Blanche: No Crepitus: No Cyanosis: No Excoriation: No Ecchymosis: No Fluctuance: No Erythema: No Friable: No Hemosiderin Staining: No Induration: No Mottled: No Localized Edema: Yes Pallor: No Rash: No Rubor: No Scarring: No Temperature / Pain Moisture Temperature: No Abnormality No Abnormalities Noted: No Tenderness on Palpation: Yes Dry / Scaly: No Maceration: Yes Moist: Yes Wound Preparation Ulcer Cleansing: Rinsed/Irrigated with Saline Topical Anesthetic Applied: Other: lidocaine 4%, Treatment  Notes Wound #1 (Right Scapula) 1. Cleansed with: Clean wound with Normal Saline 2. Anesthetic Topical Lidocaine 4% cream to wound bed prior to debridement 4. Dressing Applied: Aquacel Ag Other dressing (specify in notes) 5. Secondary Dressing Applied Bordered Foam Dressing Notes Sorbact Electronic Signature(s) Signed: 06/15/2015 3:59:50 PM By: Prescilla Sours ([id number]) Entered By: Alejandro Mulling on 06/10/2015 09:53:55 Coale, [name] ([id number]) -------------------------------------------------------------------------------- Wound Assessment Details Patient Name: [name], [name] Date of Service: 06/10/2015 10:00 AM Medical Record Number: [id number] Patient Account Number: [id number] Date of Birth/Sex: 10/25/1965 (49 y.o. Female) Treating RN: Ashok Cordia, Debi Primary Care Physician: [place], JULIE Other Clinician: Referring Physician: [place], JULIE Treating Physician/Extender: Rudene Re in Treatment: 19 Wound Status Wound Number: 2 Primary Pressure Ulcer Etiology: Wound Location: Sacrum - Midline Wound Open Wounding Event: Pressure Injury Status: Date Acquired: 12/02/2007 Comorbid Asthma, Chronic Obstructive Weeks Of Treatment: 19 History: Pulmonary Disease (COPD), Clustered Wound: No Paraplegia Photos Photo Uploaded By: Elliot Gurney, RN, BSN, Kim on 06/10/2015 17:33:53 Wound Measurements Length: (cm) 0.2 Width: (cm) 1.8 Depth: (cm) 0.1 Area: (cm) 0.283 Volume: (cm) 0.028 % Reduction in Area: 62.5% % Reduction in Volume: 62.7% Epithelialization: Large (67-100%) Tunneling: No Undermining: No Wound Description Classification: Category/Stage IV Wound Margin: Flat and Intact Exudate Amount: Medium Exudate Type: Serous Exudate Color: amber Foul Odor After Cleansing: No Wound Bed Granulation Amount: Large (67-100%) Exposed Structure Granulation Quality: Pink Fascia Exposed: No Necrotic Amount: None Present (0%) Fat Layer  Exposed: No Tendon Exposed: No [name], [name] ([id number]) Muscle Exposed: No Joint Exposed: No Bone Exposed: No Periwound Skin Texture Texture Color No Abnormalities Noted: No No Abnormalities Noted: No Callus: No Atrophie Blanche: No Crepitus: No Cyanosis: No Excoriation: No Ecchymosis: No Fluctuance: No Erythema: No Friable: No Hemosiderin Staining: No Induration: No Mottled: No Localized Edema: No Pallor: No Rash: No Rubor: No Scarring: Yes Temperature / Pain Moisture Temperature: No Abnormality No Abnormalities Noted: No Tenderness on Palpation: Yes Dry / Scaly: No Maceration: No Moist: Yes Wound Preparation Ulcer Cleansing: Rinsed/Irrigated with Saline Topical Anesthetic Applied: Other: lidocaine 4%, Treatment Notes Wound #2 (Midline Sacrum) 1. Cleansed with: Clean wound with Normal Saline 2. Anesthetic Topical Lidocaine 4% cream to wound bed prior to debridement 4. Dressing Applied: Aquacel Ag Other dressing (specify in notes) 5. Secondary Dressing Applied Bordered Foam Dressing Notes Sorbact Electronic Signature(s) Signed: 06/15/2015 3:59:50 PM By: Alejandro Mulling  Entered By: Alejandro MullingPinkerton, Debra on 06/10/2015 09:55:24 [name], [name] Qu[place] M. ([id number]) -------------------------------------------------------------------------------- Vitals Details Patient Name: [name] MastersWADE, Anastazja M. Date of Service: 06/10/2015 10:00 AM Medical Record Number: [id number] Patient Account Number: [id number] Date of Birth/Sex: 1965-09-16 (49 y.o. Female) Treating RN: Ashok CordiaPinkerton, Debi Primary Care Physician: [place], JULIE Other Clinician: Referring Physician: [place], JULIE Treating Physician/Extender: Rudene ReBritto, [name] Weeks in Treatment: 19 Vital Signs Time Taken: 09:33 Temperature (F): 97.8 Height (in): 63 Pulse (bpm): 79 Weight (lbs): 100 Respiratory Rate (breaths/min): 18 Body Mass Index (BMI): 17.7 Blood Pressure (mmHg): 88/57 Reference Range: 80 - 120 mg /  dl Electronic Signature(s) Signed: 06/15/2015 3:59:50 PM By: Alejandro MullingPinkerton, Debra Entered By: Alejandro MullingPinkerton, Debra on 06/10/2015 09:37:23

## 2015-06-24 ENCOUNTER — Encounter: Payer: Medicare Other | Admitting: Surgery

## 2015-06-24 DIAGNOSIS — L89114 Pressure ulcer of right upper back, stage 4: Secondary | ICD-10-CM | POA: Diagnosis not present

## 2015-06-24 NOTE — Progress Notes (Addendum)
[name], [name] ([id number]) Visit Report for 06/24/2015 Chief Complaint Document Details Patient Name: [name], [name] 06/24/2015 10:00 Date of Service: AM Medical Record [id number] Number: Patient Account Number: [id number] 05-22-1966 (49 y.o. Treating RN: Huel Coventry Date of Birth/Sex: Female) Other Clinician: Primary Care Physician: [place], JULIE Treating Evlyn Kanner Referring Physician: [place], JULIE Physician/Extender: Weeks in Treatment: 21 Information Obtained from: Patient Chief Complaint Patient presents to the wound care center for a consult due non healing wound. 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 She comes in for a pressure injury to the right scapular region and the sacral region. She has this for several months. Electronic Signature(s) Signed: 06/24/2015 10:23:25 AM By: Evlyn Kanner MD, FACS Entered By: Evlyn Kanner on 06/24/2015 10:23:25 [name], [name] ([id number]) -------------------------------------------------------------------------------- HPI Details Patient Name: [name], [name] 06/24/2015 10:00 Date of Service: AM Medical Record [id number] Number: Patient Account Number: [id number] 03-Mar-1966 (49 y.o. 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 For about 4-5 months she's had a decubitus ulcer on the right scapular region and she has a couple of small areas which have opened out in the region of previous scar tissue in the sacral region. He does not have any other significant comorbidities. She has a good Roho  cushion for her wheelchair and she has a specialized mattress for sleep. She has stopped smoking a year ago. 02/11/2015 -- she has not yet got her appointment with the plastic surgeons at [facility]. They did a x-ray of the sacral region this afternoon just before coming here. 02/18/2015 -- X-ray of the pelvis -- IMPRESSION:No acute osseous injury of the pelvis. If there is clinical concern regarding osteomyelitis, recommend further evaluation with CT. X-ray of the sacrum and coccyx IMPRESSION:No acute osseous abnormality of the sacrum. If there is further clinical concern regarding osteomyelitis of the sacrum, further evaluation with CT is recommended. 03/11/2015 -- she has yet to obtain the appointment with the plastic surgeons at [facility]. She says she will be diligent and follow-up on this. 03/18/2015 -- she has an appointment to see the plastic surgeons at [facility] this coming Monday. Also has some issues with her air mattress and is working on getting this replaced. 03/25/2015 -- her air mattress is working okay now and she does not yet have a firm appointment to see her plastic surgeons. 04/22/2015 -- he have not seen her for about a month and this was due to the fact that she had bronchitis, pneumothorax and was admitted to [facility]. We do not have a plastic surgery opinion as of yet. 12/22 2016 -- has been several months now and she has not been very compliant in getting an opinion with the plastic surgeons at [facility]. The appointments were canceled for various reasons and I have urged them to try and reschedule this as soon as possible. Electronic Signature(s) Signed: 06/24/2015 10:24:12 AM By: Evlyn Kanner MD, FACS Entered By: Evlyn Kanner on 06/24/2015 10:24:12 AALIJAH, MIMS ([id number]) -------------------------------------------------------------------------------- Physical Exam Details Patient Name: [name], [name] 06/24/2015 10:00 Date of  Service: AM Medical Record [id number] Number: Patient Account Number: [id number] 01-Dec-1965 (49 y.o. Treating RN: Huel Coventry  Date of Birth/Sex: Female) Other Clinician: Primary Care Physician: [place], JULIE Treating Margaret Staggs Referring Physician: [place], JULIE Physician/Extender: Weeks in Treatment: 21 Constitutional . Pulse regular. Respirations normal and unlabored. Afebrile. . Eyes Nonicteric. Reactive to light. Ears, Nose, Mouth, and Throat Lips, teeth, and gums WNL.Marland Kitchen. Moist mucosa without lesions. Neck supple and nontender. No palpable supraclavicular or cervical adenopathy. Normal sized without goiter. Respiratory WNL. No retractions.. Cardiovascular Pedal Pulses WNL. No clubbing, cyanosis or edema. Lymphatic No adneopathy. No adenopathy. No adenopathy. Musculoskeletal Adexa without tenderness or enlargement.. Digits and nails w/o clubbing, cyanosis, infection, petechiae, ischemia, or inflammatory conditions.. Integumentary (Hair, Skin) No suspicious lesions. No crepitus or fluctuance. No peri-wound warmth or erythema. No masses.Marland Kitchen. Psychiatric Judgement and insight Intact.. No evidence of depression, anxiety, or agitation.. Notes the sacral wound continues to look well good and there is no bone palpable protruding. The right scapular wound continues to have undermining between the 11 and 4:00 position and there is healthy granulation tissue but no evidence of closure. Electronic Signature(s) Signed: 06/24/2015 10:25:21 AM By: Evlyn KannerBritto, Rochanda Harpham MD, FACS Entered By: Evlyn KannerBritto, Jauna Raczynski on 06/24/2015 10:25:21 [name], [name] ([id number]) -------------------------------------------------------------------------------- Physician Orders Details Patient Name: [name], [name] 06/24/2015 10:00 Date of Service: AM Medical Record [id number] Number: Patient Account Number: [id number] Oct 10, 1965 (49 y.o. 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 Wound #2 Midline Sacrum o Cleanse wound with mild soap and water o May Shower, gently pat wound dry prior to applying new dressing. Primary Wound Dressing Wound #1 Right Scapula o Other: - Sorbact - lightly pack undermining (11-5) Wound #2 Midline Sacrum o Aquacel Ag - rope Secondary Dressing Wound #1 Right Scapula o Boardered Foam Dressing Wound #2 Midline Sacrum o Boardered Foam Dressing Dressing Change Frequency Wound #1 Right Scapula o Change dressing every other day. [name], [name] ([id number]) Wound #2 Midline Sacrum o Change dressing every other day. Follow-up Appointments Wound #1 Right Scapula o Return Appointment in 2 weeks. Wound #2 Midline Sacrum o Return Appointment in 2 weeks. Off-Loading Wound #1 Right Scapula o Turn and reposition every 2 hours Wound #2 Midline Sacrum o Turn and reposition every 2 hours Home Health Wound #1 Right Scapula o Continue Home Health Visits - [facility] o Home Health Nurse may visit PRN to address patientos wound care needs. o FACE TO FACE ENCOUNTER: MEDICARE and MEDICAID PATIENTS: I certify that this patient is under my care and that I had a face-to-face encounter that meets the physician face-to-face encounter requirements with this patient on this date. The encounter with the  patient was in whole or in part for the following MEDICAL CONDITION: (primary reason for Home Healthcare) MEDICAL NECESSITY: I certify, that based on my findings, NURSING services are a medically necessary home health service. HOME BOUND STATUS: I certify that my clinical findings support that this patient is homebound (i.e., Due to illness or injury, pt requires aid of supportive devices such as crutches, cane, wheelchairs, walkers, the use  of special transportation or the assistance of another person to leave their place of residence. There is a normal inability to leave the home and doing so requires considerable and taxing effort. Other absences are for medical reasons / religious services and are infrequent or of short duration when for other reasons). o If current dressing causes regression in wound condition, may D/C ordered dressing product/s and apply Normal Saline Moist Dressing daily until next Wound Healing Center / Other MD appointment. Notify Wound Healing Center of regression in wound condition at [phone]. o Please direct any NON-WOUND related issues/requests for orders to patient's Primary Care Physician Wound #2 Midline Sacrum o Continue Home Health Visits - [facility] o Home Health Nurse may visit PRN to address patientos wound care needs. o FACE TO FACE ENCOUNTER: MEDICARE and MEDICAID PATIENTS: I certify that this patient is under my care and that I had a face-to-face encounter that meets the physician face-to-face encounter requirements with this patient on this date. The encounter with the patient was in whole or in part for the following MEDICAL CONDITION: (primary reason for Home Healthcare) MEDICAL NECESSITY: I certify, that based on my findings, NURSING services are a medically MERIDITH, ROMICK. ([id number]) necessary home health service. HOME BOUND STATUS: I certify that my clinical findings support that this patient is homebound (i.e., Due  to illness or injury, pt requires aid of supportive devices such as crutches, cane, wheelchairs, walkers, the use of special transportation or the assistance of another person to leave their place of residence. There is a normal inability to leave the home and doing so requires considerable and taxing effort. Other absences are for medical reasons / religious services and are infrequent or of short duration when for other reasons). o If current dressing causes regression in wound condition, may D/C ordered dressing product/s and apply Normal Saline Moist Dressing daily until next Wound Healing Center / Other MD appointment. Notify Wound Healing Center of regression in wound condition at [phone]. o Please direct any NON-WOUND related issues/requests for orders to patient's Primary Care Physician Electronic Signature(s) Signed: 06/24/2015 5:05:23 PM By: Evlyn Kanner MD, FACS Signed: 06/24/2015 6:21:38 PM By: Elliot Gurney RN, BSN, Kim RN, BSN Entered By: Elliot Gurney, RN, BSN, Kim on 06/24/2015 10:27:22 CAYLAH, PLOUFF ([id number]) -------------------------------------------------------------------------------- Problem List Details Patient Name: NATALEA, SUTLIFF 06/24/2015 10:00 Date of Service: AM Medical Record [id number] Number: Patient Account Number: [id number] 1966-03-06 (49 y.o. 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 06/24/2015 10:00 Date of Service: AM Medical Record [id number] Number: Patient Account Number: [id number] 11/26/1965 (49 y.o. Treating RN: Huel Coventry Date of Birth/Sex: Female) Other Clinician: Primary Care Physician: [place], JULIE Treating Evlyn Kanner Referring Physician: [place], JULIE Physician/Extender: Tania Ade in Treatment: 21 Subjective Chief Complaint Information obtained from Patient Patient presents to the wound care center for a consult due non healing wound. 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 She comes in for a pressure injury to the right scapular region and the sacral region. She has this for several months. 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 For about 4-5 months she's had a decubitus ulcer on the right scapular region and she has a couple of small areas which have opened out in the region of previous scar tissue in the sacral region. He does not have any other significant comorbidities. She has a good Roho cushion for her wheelchair and she has a specialized mattress for sleep. She has stopped smoking a year ago. 02/11/2015 -- she has not yet got her appointment with the plastic surgeons at [facility]. They did a x-ray of the sacral region this afternoon just before coming here. 02/18/2015 -- X-ray of the pelvis -- IMPRESSION:No acute osseous injury of the pelvis. If there is clinical concern regarding osteomyelitis, recommend further evaluation with CT. X-ray of the sacrum and coccyx IMPRESSION:No acute osseous  abnormality of the sacrum. If there is further clinical concern regarding osteomyelitis of the sacrum, further evaluation with CT is recommended. 03/11/2015 -- she has yet to obtain the appointment with the plastic surgeons at [facility]. She says she will be diligent and follow-up on this. 03/18/2015 -- she has an appointment to see the plastic surgeons at [facility] this coming Monday. Also has some issues with her air mattress and is working on getting this replaced. 03/25/2015 -- her air mattress is working okay now and she does not yet have a firm appointment to see her plastic surgeons. 04/22/2015 -- he have not seen her for about a month and this was due to the fact that she had bronchitis, pneumothorax and was admitted to [facility]. We do not have a plastic surgery opinion as of yet. 12/22 2016 -- has been several months now and she has not been very compliant in getting an opinion with the plastic surgeons at [facility]. The appointments were canceled for various reasons and I have urged them to try and reschedule this as soon as possible. [name], [name] ([id number]) Objective Constitutional Pulse regular. Respirations normal and unlabored. Afebrile. Vitals Time Taken: 10:04 AM, Height: 63 in, Weight: 100 lbs, BMI: 17.7, Temperature: 97.8 F, Pulse: 78 bpm, Respiratory Rate: 18 breaths/min, Blood Pressure: 103/67 mmHg. Eyes Nonicteric. Reactive to light. Ears, Nose, Mouth, and Throat Lips, teeth, and gums WNL.Marland Kitchen Moist mucosa without lesions. Neck supple and nontender. No palpable supraclavicular or cervical adenopathy. Normal sized without goiter. Respiratory WNL. No retractions.. Cardiovascular Pedal Pulses WNL. No clubbing, cyanosis or edema. Lymphatic No adneopathy. No adenopathy. No adenopathy. Musculoskeletal Adexa without tenderness or enlargement.. Digits and nails w/o clubbing, cyanosis, infection, petechiae, ischemia, or inflammatory  conditions.Marland Kitchen Psychiatric Judgement and insight Intact.. No evidence of depression, anxiety, or agitation.. General Notes: the sacral wound continues to look well good and there is no bone palpable protruding. The right scapular wound continues to have undermining between the 11 and 4:00 position and there is healthy granulation tissue but no evidence of closure. Integumentary (Hair, Skin) No suspicious lesions. No crepitus or fluctuance. No peri-wound warmth or erythema. No masses.. Wound #1 status is Open. Original cause of wound was Pressure Injury. The wound is located on the Right Scapula. The wound measures 1cm length x 1cm width x 0.5cm depth; 0.785cm^2 area and 0.393cm^3 [name], [name] (  [id number]) volume. There is bone exposed. There is undermining starting at 7:00 and ending at 5:00 with a maximum distance of 0.5cm. There is a large amount of serosanguineous drainage noted. The wound margin is flat and intact. There is large (67-100%) red, pink granulation within the wound bed. There is no necrotic tissue within the wound bed. The periwound skin appearance exhibited: Localized Edema, Maceration, Moist. The periwound skin appearance did not exhibit: Callus, Crepitus, Excoriation, Fluctuance, Friable, Induration, Rash, Scarring, Dry/Scaly, Atrophie Blanche, Cyanosis, Ecchymosis, Hemosiderin Staining, Mottled, Pallor, Rubor, Erythema. Periwound temperature was noted as No Abnormality. The periwound has tenderness on palpation. Wound #2 status is Open. Original cause of wound was Pressure Injury. The wound is located on the Midline Sacrum. The wound measures 0.2cm length x 1.4cm width x 0.1cm depth; 0.22cm^2 area and 0.022cm^3 volume. Assessment Active Problems ICD-10 L89.114 - Pressure ulcer of right upper back, stage 4 L89.154 - Pressure ulcer of sacral region, stage 4 G82.52 - Quadriplegia, C1-C4 incomplete E44.0 - Moderate protein-calorie malnutrition Plan Wound  Cleansing: Wound #1 Right Scapula: Cleanse wound with mild soap and water May Shower, gently pat wound dry prior to applying new dressing. Wound #2 Midline Sacrum: Cleanse wound with mild soap and water May Shower, gently pat wound dry prior to applying new dressing. Primary Wound Dressing: Wound #1 Right Scapula: Other: - Sorbact - lightly pack undermining (11-5) Wound #2 Midline Sacrum: Aquacel Ag - rope Secondary Dressing: Wound #1 Right Scapula: Boardered Foam Dressing Wound #2 Midline Sacrum: Boardered Foam Dressing Baldini, Jaila M. ([id number]) Dressing Change Frequency: Wound #1 Right Scapula: Change dressing every other day. Wound #2 Midline Sacrum: Change dressing every other day. Follow-up Appointments: Wound #1 Right Scapula: Return Appointment in 2 weeks. Wound #2 Midline Sacrum: Return Appointment in 2 weeks. Off-Loading: Wound #1 Right Scapula: Turn and reposition every 2 hours Wound #2 Midline Sacrum: Turn and reposition every 2 hours Home Health: Wound #1 Right Scapula: Continue Home Health Visits - [facility] Home Health Nurse may visit PRN to address patient s wound care needs. FACE TO FACE ENCOUNTER: MEDICARE and MEDICAID PATIENTS: I certify that this patient is under my care and that I had a face-to-face encounter that meets the physician face-to-face encounter requirements with this patient on this date. The encounter with the patient was in whole or in part for the following MEDICAL CONDITION: (primary reason for Home Healthcare) MEDICAL NECESSITY: I certify, that based on my findings, NURSING services are a medically necessary home health service. HOME BOUND STATUS: I certify that my clinical findings support that this patient is homebound (i.e., Due to illness or injury, pt requires aid of supportive devices such as crutches, cane, wheelchairs, walkers, the use of special transportation or the assistance of another person to leave their  place of residence. There is a normal inability to leave the home and doing so requires considerable and taxing effort. Other absences are for medical reasons / religious services and are infrequent or of short duration when for other reasons). If current dressing causes regression in wound condition, may D/C ordered dressing product/s and apply Normal Saline Moist Dressing daily until next Wound Healing Center / Other MD appointment. Notify Wound Healing Center of regression in wound condition at [phone]. Please direct any NON-WOUND related issues/requests for orders to patient's Primary Care Physician Wound #2 Midline Sacrum: Continue Home Health Visits - [facility] Home Health Nurse may visit PRN to address patient s wound care needs. FACE TO FACE  ENCOUNTER: MEDICARE and MEDICAID PATIENTS: I certify that this patient is under my care and that I had a face-to-face encounter that meets the physician face-to-face encounter requirements with this patient on this date. The encounter with the patient was in whole or in part for the following MEDICAL CONDITION: (primary reason for Home Healthcare) MEDICAL NECESSITY: I certify, that based on my findings, NURSING services are a medically necessary home health service. HOME BOUND STATUS: I certify that my clinical findings support that this patient is homebound (i.e., Due to illness or injury, pt requires aid of supportive devices such as crutches, cane, wheelchairs, walkers, the use of special transportation or the assistance of another person to leave their place of residence. There is a normal inability to leave the home and doing so requires considerable and taxing effort. Other absences are for medical reasons / religious services and are infrequent or of short duration when for other reasons). If current dressing causes regression in wound condition, may D/C ordered dressing product/s and apply Normal Saline Moist Dressing daily  until next Wound Healing Center / Other MD appointment. Notify Wound Healing Center of regression in wound condition at [phone]. Please direct any NON-WOUND related issues/requests for orders to patient's Primary Care Physician ISHANI, GOLDWASSER. ([id number]) I have recommended we pack the wound with Sorbact, and we continue to offload and support with nutrition and vitamins. She is again urged to have a repeat appointment with [facility] plastic surgery group and I have stressed the importance of this. Electronic Signature(s) Signed: 06/24/2015 5:16:53 PM By: Evlyn Kanner MD, FACS Previous Signature: 06/24/2015 10:26:13 AM Version By: Evlyn Kanner MD, FACS Entered By: Evlyn Kanner on 06/24/2015 17:16:53 Waldvogel, [name] ([id number]) -------------------------------------------------------------------------------- SuperBill Details Patient Name: DEAVION, STRIDER. Date of Service: 06/24/2015 Medical Record Number: [id number] Patient Account Number: [id number] Date of Birth/Sex: Jul 25, 1965 (49 y.o. Female) Treating RN: Huel Coventry Primary Care Physician: [place], JULIE Other Clinician: Referring Physician: [place], JULIE Treating Physician/Extender: Rudene Re in Treatment: 21 Diagnosis Coding ICD-10 Codes Code Description L89.114 Pressure ulcer of right upper back, stage 4 L89.154 Pressure ulcer of sacral region, stage 4 G82.52 Quadriplegia, C1-C4 incomplete E44.0 Moderate protein-calorie malnutrition Facility Procedures CPT4 Code: [id number] Description: 99213 - WOUND CARE VISIT-LEV 3 EST PT Modifier: Quantity: 1 Physician Procedures CPT4 Code: [id number] Description: 99213 - WC PHYS LEVEL 3 - EST PT ICD-10 Description Diagnosis L89.114 Pressure ulcer of right upper back, stage 4 L89.154 Pressure ulcer of sacral region, stage 4 G82.52 Quadriplegia, C1-C4 incomplete E44.0 Moderate protein-calorie  malnutrition Modifier: Quantity: 1 Electronic Signature(s) Signed:  06/24/2015 5:05:23 PM By: Evlyn Kanner MD, FACS Signed: 06/24/2015 6:21:38 PM By: Elliot Gurney RN, BSN, Kim RN, BSN Previous Signature: 06/24/2015 10:26:36 AM Version By: Evlyn Kanner MD, FACS Entered By: Elliot Gurney RN, BSN, Kim on 06/24/2015 10:28:17

## 2015-06-25 NOTE — Progress Notes (Signed)
[name] [name], [name] [name] ([id number]) Visit Report for 06/24/2015 Arrival Information Details Patient Name: [name], [name] Date of Service: 06/24/2015 10:00 AM Medical Record Number: [id number] Patient Account Number: [id number] Date of Birth/Sex: 11/21/1965 (49 y.o. Female) Treating RN: [name] [name] Primary Care Physician: [name] [name], [name] Other Clinician: Referring Physician: Palestinian [name], [name] Treating Physician/Extender: [name] [name] in Treatment: 21 Visit Information History Since Last Visit Added or deleted any medications: No Patient Arrived: Wheel Chair Any new allergies or adverse reactions: No Arrival Time: 10:03 Had a fall or experienced change in No activities of daily living that may affect Accompanied By: boyfriend risk of falls: Transfer Assistance: Manual Signs or symptoms of abuse/neglect since last No Patient Identification Verified: Yes visito Secondary Verification Process Yes Hospitalized since last visit: No Completed: Pain Present Now: No Patient Requires Transmission-Based No Precautions: Patient Has Alerts: No Electronic Signature(s) Signed: 06/24/2015 6:21:38 PM By: [name] Gurney, RN, BSN, Kim RN, BSN Entered By: [name] Gurney, RN, BSN, [name] on 06/24/2015 10:03:46 [name], [name] [name] -------------------------------------------------------------------------------- Clinic Level of Care Assessment Details Patient Name: [name] [name], [name] [name] Date of Service: 06/24/2015 10:00 AM Medical Record Number: [id number] Patient Account Number: [id number] Date of Birth/Sex: 13-Aug-1965 (49 y.o. Female) Treating RN: [name] [name] Primary Care Physician: [name] [name], [name] Other Clinician: Referring Physician: Palestinian [name], [name] Treating Physician/Extender: [name] [name] in Treatment: 21 Clinic Level of Care Assessment Items TOOL 4 Quantity Score []  - Use when only an EandM is performed on FOLLOW-UP visit 0 ASSESSMENTS - Nursing Assessment / Reassessment []  - Reassessment of  Co-morbidities (includes updates in patient status) 0 X - Reassessment of Adherence to Treatment Plan 1 5 ASSESSMENTS - Wound and Skin Assessment / Reassessment X - Simple Wound Assessment / Reassessment - one wound 1 5 []  - Complex Wound Assessment / Reassessment - multiple wounds 0 []  - Dermatologic / Skin Assessment (not related to wound area) 0 ASSESSMENTS - Focused Assessment []  - Circumferential Edema Measurements - multi extremities 0 []  - Nutritional Assessment / Counseling / Intervention 0 []  - Lower Extremity Assessment (monofilament, tuning fork, pulses) 0 []  - Peripheral Arterial Disease Assessment (using hand held doppler) 0 ASSESSMENTS - Ostomy and/or Continence Assessment and Care []  - Incontinence Assessment and Management 0 []  - Ostomy Care Assessment and Management (repouching, etc.) 0 PROCESS - Coordination of Care X - Simple Patient / Family Education for ongoing care 1 15 []  - Complex (extensive) Patient / Family Education for ongoing care 0 X - Staff obtains Chiropractor, Records, Test Results / Process Orders 1 10 []  - Staff telephones HHA, Nursing Homes / Clarify orders / etc 0 []  - Routine Transfer to another Facility (non-emergent condition) 0 [name], [name] ([id number]) []  - Routine Hospital Admission (non-emergent condition) 0 []  - New Admissions / Manufacturing engineer / Ordering NPWT, Apligraf, etc. 0 []  - Emergency Hospital Admission (emergent condition) 0 X - Simple Discharge Coordination 1 10 []  - Complex (extensive) Discharge Coordination 0 PROCESS - Special Needs []  - Pediatric / Minor Patient Management 0 []  - Isolation Patient Management 0 []  - Hearing / Language / Visual special needs 0 []  - Assessment of Community assistance (transportation, D/C planning, etc.) 0 []  - Additional assistance / Altered mentation 0 []  - Support Surface(s) Assessment (bed, cushion, seat, etc.) 0 INTERVENTIONS - Wound Cleansing / Measurement []  - Simple Wound  Cleansing - one wound 0 X - Complex Wound Cleansing - multiple wounds 2 5 X - Wound Imaging (photographs - any number of wounds) 1 5 []  - Wound  Tracing (instead of photographs) 0  - Simple Wound Measurement - one wound 0 X - Complex Wound Measurement - multiple wounds 2 5 INTERVENTIONS - Wound Dressings  - Small Wound Dressing one or multiple wounds 0 X - Medium Wound Dressing one or multiple wounds 2 15  - Large Wound Dressing one or multiple wounds 0  - Application of Medications - topical 0  - Application of Medications - injection 0 INTERVENTIONS - Miscellaneous  - External ear exam 0 [name], [name] ([id number])  - Specimen Collection (cultures, biopsies, blood, body fluids, etc.) 0  - Specimen(s) / Culture(s) sent or taken to Lab for analysis 0  - Patient Transfer (multiple staff / Michiel Sites Lift / Similar devices) 0  - Simple Staple / Suture removal (25 or less) 0  - Complex Staple / Suture removal (26 or more) 0  - Hypo / Hyperglycemic Management (close monitor of Blood Glucose) 0  - Ankle / Brachial Index (ABI) - do not check if billed separately 0 X - Vital Signs 1 5 Has the patient been seen at the hospital within the last three years: Yes Total Score: 105 Level Of Care: New/Established - Level 3 Electronic Signature(s) Signed: 06/24/2015 6:21:38 PM By: [name] Gurney, RN, BSN, Kim RN, BSN Entered By: [name] Gurney, RN, BSN, [name] on 06/24/2015 10:28:01 [name] [name] ([id number]) -------------------------------------------------------------------------------- Encounter Discharge Information Details Patient Name: [name], [name] Date of Service: 06/24/2015 10:00 AM Medical Record Number: [id number] Patient Account Number: [id number] Date of Birth/Sex: 1965/12/29 (49 y.o. Female) Treating RN: [name] [name] Primary Care Physician: [name] [name], [name] Other Clinician: Referring Physician: Palestinian [name], [name] Treating Physician/Extender: [name] [name] in Treatment:  21 Encounter Discharge Information Items Discharge Pain Level: 0 Discharge Condition: Unstable Ambulatory Status: Wheelchair Discharge Destination: Home Transportation: Private Auto Accompanied By: boyfriend Schedule Follow-up Appointment: Yes Medication Reconciliation completed and provided to Patient/Care Yes Zamyah Wiesman: Provided on Clinical Summary of Care: 06/24/2015 Form Type Recipient Paper Patient CW Electronic Signature(s) Signed: 06/24/2015 10:30:48 AM By: Gwenlyn Perking Entered By: Gwenlyn Perking on 06/24/2015 10:30:48 Freiberger, [name] [name] ([id number]) -------------------------------------------------------------------------------- Multi Wound Chart Details Patient Name: [name] [name] Date of Service: 06/24/2015 10:00 AM Medical Record Number: [id number] Patient Account Number: [id number] Date of Birth/Sex: 1966-02-10 (49 y.o. Female) Treating RN: [name] [name] Primary Care Physician: [name] [name], [name] Other Clinician: Referring Physician: Palestinian [name], [name] Treating Physician/Extender: [name] [name] in Treatment: 21 Vital Signs Height(in): 63 Pulse(bpm): 78 Weight(lbs): 100 Blood Pressure 103/67 (mmHg): Body Mass Index(BMI): 18 Temperature(F): 97.8 Respiratory Rate 18 (breaths/min): Photos: [1:No Photos] [2:No Photos] [N/A:N/A] Wound Location: [1:Right Scapula] [2:Midline Sacrum] [N/A:N/A] Wounding Event: [1:Pressure Injury] [2:Pressure Injury] [N/A:N/A] Primary Etiology: [1:Pressure Ulcer] [2:Pressure Ulcer] [N/A:N/A] Comorbid History: [1:Asthma, Chronic Obstructive Pulmonary Disease (COPD), Paraplegia] [2:N/A] [N/A:N/A] Date Acquired: [1:09/21/2014] [2:12/02/2007] [N/A:N/A] Weeks of Treatment: [1:21] [2:21] [N/A:N/A] Wound Status: [1:Open] [2:Open] [N/A:N/A] Measurements L x W x D 1x1x0.5 [2:0.2x1.4x0.1] [N/A:N/A] (cm) Area (cm) : [1:0.785] [2:0.22] [N/A:N/A] Volume (cm) : [1:0.393] [2:0.022] [N/A:N/A] % Reduction in Area: [1:35.90%] [2:70.80%]  [N/A:N/A] % Reduction in Volume: 59.90% [2:70.70%] [N/A:N/A] Starting Position 1 7 (o'clock): Ending Position 1 [1:5] (o'clock): Maximum Distance 1 0.5 (cm): Undermining: [1:Yes] [2:N/A] [N/A:N/A] 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 ([id number]) Odor Anticipated Due to No N/A N/A Product Use: Wound Margin: Flat and Intact N/A N/A Granulation Amount: Large (67-100%) N/A N/A Granulation Quality: Red, Pink N/A  N/A Necrotic Amount: None Present (0%) N/A N/A Exposed Structures: Bone: Yes N/A N/A Fascia: No Fat: No Tendon: No Muscle: No Joint: No Epithelialization: None N/A N/A Periwound Skin Texture: Edema: Yes No Abnormalities Noted N/A Excoriation: No Induration: No Callus: No Crepitus: No Fluctuance: No Friable: No Rash: No Scarring: No Periwound Skin Maceration: Yes No Abnormalities Noted N/A Moisture: Moist: Yes Dry/Scaly: No Periwound Skin Color: Atrophie Blanche: No No Abnormalities Noted N/A Cyanosis: No Ecchymosis: No Erythema: No Hemosiderin Staining: No Mottled: No Pallor: No Rubor: No Temperature: No Abnormality N/A N/A Tenderness on Yes No N/A Palpation: Wound Preparation: Ulcer Cleansing: N/A N/A Rinsed/Irrigated with Saline Topical Anesthetic Applied: Other: lidocaine 4% Treatment Notes Electronic Signature(s) Signed: 06/24/2015 6:21:38 PM By: [name] Gurney, RN, BSN, Kim RN, BSN Entered By: [name] Gurney, RN, BSN, [name] on 06/24/2015 10:26:41 Mallis, [name] [name] ([id number]) [name] [name], [name] [name] ([id number]) -------------------------------------------------------------------------------- Multi-Disciplinary Care Plan Details Patient Name: [name] [name], [name] [name] Date of Service: 06/24/2015 10:00 AM Medical Record Number:  [id number] Patient Account Number: [id number] Date of Birth/Sex: December 13, 1965 (49 y.o. Female) Treating RN: [name] [name] Primary Care Physician: [name] [name], [name] Other Clinician: Referring Physician: Palestinian [name], [name] Treating Physician/Extender: [name] [name] in Treatment: 21 Active Inactive Abuse / Safety / Falls / Self Care Management Nursing Diagnoses: Impaired home maintenance Impaired physical mobility Potential for falls Self care deficit: actual or potential Goals: Patient/caregiver will verbalize understanding of skin care regimen Date Initiated: 01/27/2015 Goal Status: Active Patient/caregiver will verbalize/demonstrate measure taken to improve self care Date Initiated: 01/27/2015 Goal Status: Active Patient/caregiver will verbalize/demonstrate measures taken to improve the patient's personal safety Date Initiated: 01/27/2015 Goal Status: Active Patient/caregiver will verbalize/demonstrate measures taken to prevent injury and/or falls Date Initiated: 01/27/2015 Goal Status: Active Patient/caregiver will verbalize/demonstrate understanding of what to do in case of emergency Date Initiated: 01/27/2015 Goal Status: Active Interventions: Assess fall risk on admission and as needed Assess: immobility, friction, shearing, incontinence upon admission and as needed Assess impairment of mobility on admission and as needed per policy Assess self care needs on admission and as needed Provide education on basic hygiene Provide education on fall prevention Provide education on personal and home safety Provide education on safe transfers [name] [name], [name] [name] ([id number]) Treatment Activities: Education provided on Basic Hygiene : 02/04/2015 Notes: Orientation to the Wound Care Program Nursing Diagnoses: Knowledge deficit related to the wound healing center program Goals: Patient/caregiver will verbalize understanding of the Wound Healing Center Program Date Initiated: 01/27/2015 Goal  Status: Active Interventions: Provide education on orientation to the wound center Notes: Pressure Nursing Diagnoses: Knowledge deficit related to causes and risk factors for pressure ulcer development Knowledge deficit related to management of pressures ulcers Potential for impaired tissue integrity related to pressure, friction, moisture, and shear Goals: Patient will remain free from development of additional pressure ulcers Date Initiated: 01/27/2015 Goal Status: Active Patient will remain free of pressure ulcers Date Initiated: 01/27/2015 Goal Status: Active Patient/caregiver will verbalize risk factors for pressure ulcer development Date Initiated: 01/27/2015 Goal Status: Active Patient/caregiver will verbalize understanding of pressure ulcer management Date Initiated: 01/27/2015 Goal Status: Active Interventions: Assess: immobility, friction, shearing, incontinence upon admission and as needed Assess offloading mechanisms upon admission and as needed Assess potential for pressure ulcer upon admission and as needed Provide education on pressure ulcers [name] [name], [name] [name] ([id number]) Treatment Activities: Patient referred for pressure reduction/relief devices : 06/24/2015 Patient referred for seating evaluation to ensure proper offloading : 06/24/2015 Notes: Wound/Skin Impairment Nursing Diagnoses: Impaired tissue integrity Knowledge deficit related to smoking impact  on wound healing Knowledge deficit related to ulceration/compromised skin integrity Goals: Patient/caregiver will verbalize understanding of skin care regimen Date Initiated: 01/27/2015 Goal Status: Active Ulcer/skin breakdown will have a volume reduction of 30% by week 4 Date Initiated: 01/27/2015 Goal Status: Active Ulcer/skin breakdown will have a volume reduction of 50% by week 8 Date Initiated: 01/27/2015 Goal Status: Active Ulcer/skin breakdown will have a volume reduction of 80% by week 12 Date  Initiated: 01/27/2015 Goal Status: Active Ulcer/skin breakdown will heal within 14 weeks Date Initiated: 01/27/2015 Goal Status: Active Interventions: Assess ulceration(s) every visit Provide education on ulcer and skin care Notes: Electronic Signature(s) Signed: 06/24/2015 6:21:38 PM By: [name] GurneyWoody, RN, BSN, Kim RN, BSN Entered By: [name] GurneyWoody, RN, BSN, [name] on 06/24/2015 10:26:19 [name] [name], [name] ([id number]) -------------------------------------------------------------------------------- Patient/Caregiver Education Details Patient Name: [name] [name], [name] Date of Service: 06/24/2015 10:00 AM Medical Record Number: [id number] Patient Account Number: [id number] Date of Birth/Gender: 18-Apr-1966 (49 y.o. Female) Treating RN: [name] CoventryWoody, [name] Primary Care Physician: [name] TerritoryMONACO, [name] Other Clinician: Referring Physician: Palestinian TerritoryMONACO, [name] Treating Physician/Extender: [name] ReBritto, [name] Weeks in Treatment: 21 Education Assessment Education Provided To: Patient Education Topics Provided Wound/Skin Impairment: Handouts: Caring for Your Ulcer, Other: follow-up with [facility] Methods: Demonstration, Explain/Verbal Responses: State content correctly Electronic Signature(s) Signed: 06/24/2015 6:21:38 PM By: [name] GurneyWoody, RN, BSN, Kim RN, BSN Entered By: [name] GurneyWoody, RN, BSN, [name] on 06/24/2015 10:30:28 [name] [name], Jonella M. ([id number]) -------------------------------------------------------------------------------- Wound Assessment Details Patient Name: [name] [name], Bambi M. Date of Service: 06/24/2015 10:00 AM Medical Record Number: [id number] Patient Account Number: [id number] Date of Birth/Sex: 18-Apr-1966 (49 y.o. Female) Treating RN: [name] CoventryWoody, [name] Primary Care Physician: [name] TerritoryMONACO, [name] Other Clinician: Referring Physician: Palestinian TerritoryMONACO, [name] Treating Physician/Extender: [name] ReBritto, [name] Weeks in Treatment: 21 Wound Status Wound Number: 1 Primary Pressure Ulcer Etiology: Wound Location: Right Scapula Wound Open Wounding Event:  Pressure Injury Status: Date Acquired: 09/21/2014 Comorbid Asthma, Chronic Obstructive Weeks Of Treatment: 21 History: Pulmonary Disease (COPD), Clustered Wound: No Paraplegia Photos Photo Uploaded By: [name] GurneyWoody, RN, BSN, [name] on 06/24/2015 18:35:59 Wound Measurements Length: (cm) 1 Width: (cm) 1 Depth: (cm) 0.5 Area: (cm) 0.785 Volume: (cm) 0.393 % Reduction in Area: 35.9% % Reduction in Volume: 59.9% Epithelialization: None Undermining: Yes Starting Position (o'clock): 7 Ending Position (o'clock): 5 Maximum Distance: (cm) 0.5 Wound Description Classification: Category/Stage IV Wound Margin: Flat and Intact Exudate Amount: Large Exudate Type: Serosanguineous Exudate Color: red, brown Foul Odor After Cleansing: Yes Due to Product Use: No Wound Bed Granulation Amount: Large (67-100%) Exposed Structure Salvia, Marlena M. ([id number]) Granulation Quality: Red, Pink Fascia Exposed: No Necrotic Amount: None Present (0%) Fat Layer Exposed: No Tendon Exposed: No Muscle Exposed: No Joint Exposed: No Bone Exposed: Yes Periwound Skin Texture Texture Color No Abnormalities Noted: No No Abnormalities Noted: No Callus: No Atrophie Blanche: No Crepitus: No Cyanosis: No Excoriation: No Ecchymosis: No Fluctuance: No Erythema: No Friable: No Hemosiderin Staining: No Induration: No Mottled: No Localized Edema: Yes Pallor: No Rash: No Rubor: No Scarring: No Temperature / Pain Moisture Temperature: No Abnormality No Abnormalities Noted: No Tenderness on Palpation: Yes Dry / Scaly: No Maceration: Yes Moist: Yes Wound Preparation Ulcer Cleansing: Rinsed/Irrigated with Saline Topical Anesthetic Applied: Other: lidocaine 4%, Treatment Notes Wound #1 (Right Scapula) 1. Cleansed with: Clean wound with Normal Saline 2. Anesthetic Topical Lidocaine 4% cream to wound bed prior to debridement 4. Dressing Applied: Other dressing (specify in notes) 5. Secondary Dressing  Applied Bordered Foam Dressing Notes Sorbact Electronic Signature(s) Signed: 06/24/2015 6:21:38 PM By: [name] GurneyWoody, RN, BSN, Kim RN, BSN Entered  By: [name] Gurney, RN, BSN, [name] on 06/24/2015 10:12:07 Faulconer, KELLE RUPPERT ([id number] Pellegrini, [name] [name] ([id number]) -------------------------------------------------------------------------------- Wound Assessment Details Patient Name: REGANNE, MESSERSCHMIDT. Date of Service: 06/24/2015 10:00 AM Medical Record Number: [id number] Patient Account Number: [id number] Date of Birth/Sex: Jun 27, 1966 (49 y.o. Female) Treating RN: [name] [name] Primary Care Physician: [name] [name], [name] Other Clinician: Referring Physician: Palestinian [name], [name] Treating Physician/Extender: [name] [name] in Treatment: 21 Wound Status Wound Number: 2 Primary Etiology: Pressure Ulcer Wound Location: Midline Sacrum Wound Status: Open Wounding Event: Pressure Injury Date Acquired: 12/02/2007 Weeks Of Treatment: 21 Clustered Wound: No Photos Photo Uploaded By: [name] Gurney, RN, BSN, [name] on 06/24/2015 18:36:12 Wound Measurements Length: (cm) 0.2 Width: (cm) 1.4 Depth: (cm) 0.1 Area: (cm) 0.22 Volume: (cm) 0.022 % Reduction in Area: 70.8% % Reduction in Volume: 70.7% Wound Description Classification: Category/Stage IV Periwound Skin Texture Texture Color No Abnormalities Noted: No No Abnormalities Noted: No Moisture No Abnormalities Noted: No Treatment Notes Wound #2 (Midline Sacrum) 1. Cleansed with: ANABELL, SWINT. ([id number]) Clean wound with Normal Saline 2. Anesthetic Topical Lidocaine 4% cream to wound bed prior to debridement 4. Dressing Applied: Aquacel Ag Other dressing (specify in notes) 5. Secondary Dressing Applied Bordered Foam Dressing Electronic Signature(s) Signed: 06/24/2015 6:21:38 PM By: [name] Gurney, RN, BSN, Kim RN, BSN Entered By: [name] Gurney, RN, BSN, [name] on 06/24/2015 10:13:28 Mcglasson, [name] [name]  ([id number]) -------------------------------------------------------------------------------- Vitals Details Patient Name: JOHNELLA, CRUMM. Date of Service: 06/24/2015 10:00 AM Medical Record Number: [id number] Patient Account Number: [id number] Date of Birth/Sex: 1966/03/02 (49 y.o. Female) Treating RN: [name] [name] Primary Care Physician: [name] [name], [name] Other Clinician: Referring Physician: Palestinian [name], [name] Treating Physician/Extender: [name] [name] in Treatment: 21 Vital Signs Time Taken: 10:04 Temperature (F): 97.8 Height (in): 63 Pulse (bpm): 78 Weight (lbs): 100 Respiratory Rate (breaths/min): 18 Body Mass Index (BMI): 17.7 Blood Pressure (mmHg): 103/67 Reference Range: 80 - 120 mg / dl Electronic Signature(s) Signed: 06/24/2015 6:21:38 PM By: [name] Gurney, RN, BSN, Kim RN, BSN Entered By: [name] Gurney, RN, BSN, [name] on 06/24/2015 10:04:09

## 2015-07-08 ENCOUNTER — Encounter: Payer: Medicare Other | Attending: Surgery | Admitting: Surgery

## 2015-07-08 DIAGNOSIS — L89159 Pressure ulcer of sacral region, unspecified stage: Secondary | ICD-10-CM | POA: Diagnosis not present

## 2015-07-08 DIAGNOSIS — L89154 Pressure ulcer of sacral region, stage 4: Secondary | ICD-10-CM | POA: Insufficient documentation

## 2015-07-08 DIAGNOSIS — E440 Moderate protein-calorie malnutrition: Secondary | ICD-10-CM | POA: Diagnosis not present

## 2015-07-08 DIAGNOSIS — G8252 Quadriplegia, C1-C4 incomplete: Secondary | ICD-10-CM | POA: Insufficient documentation

## 2015-07-08 DIAGNOSIS — L89114 Pressure ulcer of right upper back, stage 4: Secondary | ICD-10-CM | POA: Diagnosis not present

## 2015-07-08 DIAGNOSIS — Z87891 Personal history of nicotine dependence: Secondary | ICD-10-CM | POA: Diagnosis not present

## 2015-07-08 DIAGNOSIS — G8250 Quadriplegia, unspecified: Secondary | ICD-10-CM | POA: Diagnosis present

## 2015-07-09 NOTE — Progress Notes (Signed)
[name], [name] ([id number]) Visit Report for 07/08/2015 Arrival Information Details Patient Name: [name], [name] Date of Service: 07/08/2015 10:00 AM Medical Record Number: [id number] Patient Account Number: [id number] Date of Birth/Sex: [birth year] (50 y.o. Female) Treating RN: Afful, RN, BSN, [place] Sink Primary Care Physician: [place], JULIE Other Clinician: Referring Physician: [place], JULIE Treating Physician/Extender: Rudene Re in Treatment: 23 Visit Information History Since Last Visit Added or deleted any medications: No Patient Arrived: Wheel Chair Any new allergies or adverse reactions: No Arrival Time: 10:05 Had a fall or experienced change in No activities of daily living that may affect Accompanied By: friend risk of falls: Transfer Assistance: None Signs or symptoms of abuse/neglect since last No Patient Identification Verified: Yes visito Secondary Verification Process Yes Hospitalized since last visit: No Completed: Has Dressing in Place as Prescribed: Yes Patient Requires Transmission-Based No Pain Present Now: No Precautions: Patient Has Alerts: No Electronic Signature(s) Signed: 07/08/2015 3:35:50 PM By: Elpidio Eric BSN, RN Entered By: Elpidio Eric on 07/08/2015 10:05:47 Roussel, [name] ([id number]) -------------------------------------------------------------------------------- Encounter Discharge Information Details Patient Name: [name], [name] Date of Service: 07/08/2015 10:00 AM Medical Record Number: [id number] Patient Account Number: [id number] Date of Birth/Sex: June 23, 1966 (50 y.o. Female) Treating RN: Afful, RN, BSN, [place] Sink Primary Care Physician: [place], JULIE Other Clinician: Referring Physician: [place], JULIE Treating Physician/Extender: Rudene Re in Treatment: 27 Encounter Discharge Information Items Discharge Pain Level: 0 Discharge Condition: Stable Ambulatory Status: Wheelchair Discharge Destination: Home Transportation:  Other Accompanied By: friend Schedule Follow-up Appointment: No Medication Reconciliation completed No and provided to Patient/Care Skyleen Bentley: Provided on Clinical Summary of Care: 07/08/2015 Form Type Recipient Paper Patient CW Electronic Signature(s) Signed: 07/08/2015 10:38:33 AM By: Gwenlyn Perking Entered By: Gwenlyn Perking on 07/08/2015 10:38:32 [name], [name] ([id number]) -------------------------------------------------------------------------------- Lower Extremity Assessment Details Patient Name: [name] Masters. Date of Service: 07/08/2015 10:00 AM Medical Record Number: [id number] Patient Account Number: [id number] Date of Birth/Sex: 12-14-1965 (50 y.o. Female) Treating RN: Afful, RN, BSN, [place] Sink Primary Care Physician: [place], JULIE Other Clinician: Referring Physician: [place], JULIE Treating Physician/Extender: Rudene Re in Treatment: 23 Electronic Signature(s) Signed: 07/08/2015 3:35:50 PM By: Elpidio Eric BSN, RN Entered By: Elpidio Eric on 07/08/2015 10:06:38 [name] Masters ([id number]) -------------------------------------------------------------------------------- Multi Wound Chart Details Patient Name: [name], [name] Date of Service: 07/08/2015 10:00 AM Medical Record Number: [id number] Patient Account Number: [id number] Date of Birth/Sex: 10-27-1965 (50 y.o. Female) Treating RN: Clover Mealy, RN, BSN, [place] Sink Primary Care Physician: [place], JULIE Other Clinician: Referring Physician: [place], JULIE Treating Physician/Extender: Rudene Re in Treatment: 23 Vital Signs Height(in): 63 Pulse(bpm): 71 Weight(lbs): 100 Blood Pressure 111/68 (mmHg): Body Mass Index(BMI): 18 Temperature(F): 97.9 Respiratory Rate 17 (breaths/min): Photos: [1:No Photos] [2:No Photos] [N/A:N/A] Wound Location: [1:Right Scapula] [2:Sacrum - Midline] [N/A:N/A] Wounding Event: [1:Pressure Injury] [2:Pressure Injury] [N/A:N/A] Primary Etiology: [1:Pressure Ulcer]  [2:Pressure Ulcer] [N/A:N/A] Comorbid History: [1:Asthma, Chronic Obstructive Pulmonary Disease (COPD), Paraplegia] [2:Asthma, Chronic Obstructive Pulmonary Disease (COPD), Paraplegia] [N/A:N/A] Date Acquired: [1:09/21/2014] [2:12/02/2007] [N/A:N/A] Weeks of Treatment: [1:23] [2:23] [N/A:N/A] Wound Status: [1:Open] [2:Open] [N/A:N/A] Measurements [name] x W x D 1.5x1x0.5 [2:1x1.5x0.1] [N/A:N/A] (cm) Area (cm) : [1:1.178] [2:1.178] [N/A:N/A] Volume (cm) : [1:0.589] [2:0.118] [N/A:N/A] % Reduction in Area: [1:3.80%] [2:-56.20%] [N/A:N/A] % Reduction in Volume: 39.90% [2:-57.30%] [N/A:N/A] 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] [N/A:N/A] Exudate Amount: [1:Large] [2:Small] [N/A:N/A] Exudate Type: [1:Serosanguineous] [2:Serosanguineous] [N/A:N/A] Exudate Color: [1:red, brown] [2:red, brown] [N/A:N/A] Foul Odor After [1:Yes] [2:No] [N/A:N/A] Cleansing: Odor Anticipated Due to No [2:N/A] [N/A:N/A] Product Use: Wound Margin: [1:Flat and Intact] [  2:Distinct, outline attached] [N/A:N/A] Granulation Amount: [1:Large (67-100%)] [2:Large (67-100%)] [N/A:N/A] Granulation Quality: [1:Red, Pink] [2:Pink, Pale] [N/A:N/A] Necrotic Amount: [1:None Present (0%)] [2:None Present (0%)] [N/A:N/A] Exposed Structures: [N/A:N/A] Bone: Yes Fascia: No Fascia: No Fat: No Fat: No Tendon: No Tendon: No Muscle: No Muscle: No Joint: No Joint: No Bone: No Limited to Skin Breakdown Epithelialization: None None N/A Periwound Skin Texture: Edema: Yes Edema: No N/A Excoriation: No Excoriation: No Induration: No Induration: No Callus: No Callus: No Crepitus: No Crepitus: No Fluctuance: No Fluctuance: No Friable: No Friable: No Rash: No Rash: No Scarring: No Scarring: No Periwound Skin Maceration: Yes Maceration: Yes N/A Moisture: Moist: Yes Moist: Yes Dry/Scaly: No Dry/Scaly: No Periwound Skin Color: Atrophie Blanche: No Atrophie Blanche: No N/A Cyanosis: No Cyanosis:  No Ecchymosis: No Ecchymosis: No Erythema: No Erythema: No Hemosiderin Staining: No Hemosiderin Staining: No Mottled: No Mottled: No Pallor: No Pallor: No Rubor: No Rubor: No Temperature: No Abnormality N/A N/A Tenderness on Yes Yes N/A Palpation: Wound Preparation: Ulcer Cleansing: Ulcer Cleansing: N/A Rinsed/Irrigated with Rinsed/Irrigated with Saline Saline Topical Anesthetic Topical Anesthetic Applied: Other: lidocaine Applied: Other: lidocaine 4% 4% Treatment Notes Electronic Signature(s) Signed: 07/08/2015 3:35:50 PM By: Elpidio Eric BSN, RN Entered By: Elpidio Eric on 07/08/2015 10:27:12 [name] Masters ([id number]) -------------------------------------------------------------------------------- Multi-Disciplinary Care Plan Details Patient Name: [name], [name] Date of Service: 07/08/2015 10:00 AM Medical Record Number: [id number] Patient Account Number: [id number] Date of Birth/Sex: [birth year] (50 y.o. Female) Treating RN: Afful, RN, BSN, [place] Primary Care Physician: [place], JULIE Other Clinician: Referring Physician: [place], JULIE Treating Physician/Extender: Rudene Re in Treatment: 67 Active Inactive Abuse / Safety / Falls / Self Care Management Nursing Diagnoses: Impaired home maintenance Impaired physical mobility Potential for falls Self care deficit: actual or potential Goals: Patient/caregiver will verbalize understanding of skin care regimen Date Initiated: 01/27/2015 Goal Status: Active Patient/caregiver will verbalize/demonstrate measure taken to improve self care Date Initiated: 01/27/2015 Goal Status: Active Patient/caregiver will verbalize/demonstrate measures taken to improve the patient's personal safety Date Initiated: 01/27/2015 Goal Status: Active Patient/caregiver will verbalize/demonstrate measures taken to prevent injury and/or falls Date Initiated: 01/27/2015 Goal Status: Active Patient/caregiver will verbalize/demonstrate  understanding of what to do in case of emergency Date Initiated: 01/27/2015 Goal Status: Active Interventions: Assess fall risk on admission and as needed Assess: immobility, friction, shearing, incontinence upon admission and as needed Assess impairment of mobility on admission and as needed per policy Assess self care needs on admission and as needed Provide education on basic hygiene Provide education on fall prevention Provide education on personal and home safety Provide education on safe transfers [name], [name] ([id number]) Treatment Activities: Education provided on Basic Hygiene : 02/04/2015 Notes: Orientation to the Wound Care Program Nursing Diagnoses: Knowledge deficit related to the wound healing center program Goals: Patient/caregiver will verbalize understanding of the Wound Healing Center Program Date Initiated: 01/27/2015 Goal Status: Active Interventions: Provide education on orientation to the wound center Notes: Pressure Nursing Diagnoses: Knowledge deficit related to causes and risk factors for pressure ulcer development Knowledge deficit related to management of pressures ulcers Potential for impaired tissue integrity related to pressure, friction, moisture, and shear Goals: Patient will remain free from development of additional pressure ulcers Date Initiated: 01/27/2015 Goal Status: Active Patient will remain free of pressure ulcers Date Initiated: 01/27/2015 Goal Status: Active Patient/caregiver will verbalize risk factors for pressure ulcer development Date Initiated: 01/27/2015 Goal Status: Active Patient/caregiver will verbalize understanding of pressure ulcer management Date Initiated: 01/27/2015 Goal Status: Active Interventions: Assess:  immobility, friction, shearing, incontinence upon admission and as needed Assess offloading mechanisms upon admission and as needed Assess potential for pressure ulcer upon admission and as needed Provide  education on pressure ulcers [name], [name] ([id number]) Treatment Activities: Patient referred for pressure reduction/relief devices : 07/08/2015 Patient referred for seating evaluation to ensure proper offloading : 07/08/2015 Notes: Wound/Skin Impairment Nursing Diagnoses: Impaired tissue integrity Knowledge deficit related to smoking impact on wound healing Knowledge deficit related to ulceration/compromised skin integrity Goals: Patient/caregiver will verbalize understanding of skin care regimen Date Initiated: 01/27/2015 Goal Status: Active Ulcer/skin breakdown will have a volume reduction of 30% by week 4 Date Initiated: 01/27/2015 Goal Status: Active Ulcer/skin breakdown will have a volume reduction of 50% by week 8 Date Initiated: 01/27/2015 Goal Status: Active Ulcer/skin breakdown will have a volume reduction of 80% by week 12 Date Initiated: 01/27/2015 Goal Status: Active Ulcer/skin breakdown will heal within 14 weeks Date Initiated: 01/27/2015 Goal Status: Active Interventions: Assess ulceration(s) every visit Provide education on ulcer and skin care Notes: Electronic Signature(s) Signed: 07/08/2015 3:35:50 PM By: Elpidio Eric BSN, RN Entered By: Elpidio Eric on 07/08/2015 10:27:03 [name], [name] ([id number]) -------------------------------------------------------------------------------- Pain Assessment Details Patient Name: [name] Masters. Date of Service: 07/08/2015 10:00 AM Medical Record Number: [id number] Patient Account Number: [id number] Date of Birth/Sex: 27-May-1966 (50 y.o. Female) Treating RN: Clover Mealy, RN, BSN, [place] Sink Primary Care Physician: [place], JULIE Other Clinician: Referring Physician: [place], JULIE Treating Physician/Extender: Rudene Re in Treatment: 23 Active Problems Location of Pain Severity and Description of Pain Patient Has Paino No Site Locations Pain Management and Medication Current Pain Management: Electronic  Signature(s) Signed: 07/08/2015 3:35:50 PM By: Elpidio Eric BSN, RN Entered By: Elpidio Eric on 07/08/2015 10:05:54 [name] Masters ([id number]) -------------------------------------------------------------------------------- Patient/Caregiver Education Details Patient Name: [name], MULNIX. Date of Service: 07/08/2015 10:00 AM Medical Record Number: [id number] Patient Account Number: [id number] Date of Birth/Gender: [birth year] (50 y.o. Female) Treating RN: Afful, RN, BSN, [place] Sink Primary Care Physician: [place], JULIE Other Clinician: Referring Physician: [place], JULIE Treating Physician/Extender: Rudene Re in Treatment: 56 Education Assessment Education Provided To: Patient Education Topics Provided Basic Hygiene: Methods: Explain/Verbal Responses: State content correctly Pressure: Methods: Explain/Verbal Responses: State content correctly Safety: Methods: Explain/Verbal Responses: State content correctly Welcome To The Wound Care Center: Methods: Explain/Verbal Wound/Skin Impairment: Methods: Explain/Verbal Responses: State content correctly Electronic Signature(s) Signed: 07/08/2015 3:35:50 PM By: Elpidio Eric BSN, RN Entered By: Elpidio Eric on 07/08/2015 10:35:42 Glazer, Hareem Judie Petit ([id number]) -------------------------------------------------------------------------------- Wound Assessment Details Patient Name: [name] Masters. Date of Service: 07/08/2015 10:00 AM Medical Record Number: [id number] Patient Account Number: [id number] Date of Birth/Sex: 1966-05-30 (50 y.o. Female) Treating RN: Afful, RN, BSN, Psychologist, clinical Primary Care Physician: [place], JULIE Other Clinician: Referring Physician: [place], JULIE Treating Physician/Extender: Rudene Re in Treatment: 23 Wound Status Wound Number: 1 Primary Pressure Ulcer Etiology: Wound Location: Right Scapula Wound Open Wounding Event: Pressure Injury Status: Date Acquired: 09/21/2014 Comorbid Asthma, Chronic  Obstructive Weeks Of Treatment: 23 History: Pulmonary Disease (COPD), Clustered Wound: No Paraplegia Photos Photo Uploaded By: Elpidio Eric on 07/08/2015 15:35:17 Wound Measurements Length: (cm) 1.5 Width: (cm) 1 Depth: (cm) 0.5 Area: (cm) 1.178 Volume: (cm) 0.589 % Reduction in Area: 3.8% % Reduction in Volume: 39.9% Epithelialization: None Tunneling: No Undermining: No Wound Description Classification: Category/Stage IV Wound Margin: Flat and Intact Exudate Amount: Large Exudate Type: Serosanguineous Exudate Color: red, brown Foul Odor After Cleansing: Yes Due to Product Use: No Wound Bed Granulation Amount: Large (67-100%) Exposed Structure Granulation  Quality: Red, Pink Fascia Exposed: No Necrotic Amount: None Present (0%) Fat Layer Exposed: No Tendon Exposed: No [name], Lise M. ([id number]) Muscle Exposed: No Joint Exposed: No Bone Exposed: Yes Periwound Skin Texture Texture Color No Abnormalities Noted: No No Abnormalities Noted: No Callus: No Atrophie Blanche: No Crepitus: No Cyanosis: No Excoriation: No Ecchymosis: No Fluctuance: No Erythema: No Friable: No Hemosiderin Staining: No Induration: No Mottled: No Localized Edema: Yes Pallor: No Rash: No Rubor: No Scarring: No Temperature / Pain Moisture Temperature: No Abnormality No Abnormalities Noted: No Tenderness on Palpation: Yes Dry / Scaly: No Maceration: Yes Moist: Yes Wound Preparation Ulcer Cleansing: Rinsed/Irrigated with Saline Topical Anesthetic Applied: Other: lidocaine 4%, Treatment Notes Wound #1 (Right Scapula) 1. Cleansed with: Clean wound with Normal Saline 4. Dressing Applied: Aquacel Ag 5. Secondary Dressing Applied Bordered Foam Dressing Electronic Signature(s) Signed: 07/08/2015 3:35:50 PM By: Elpidio Eric BSN, RN Entered By: Elpidio Eric on 07/08/2015 10:22:46 [place], [name]  ([id number]) -------------------------------------------------------------------------------- Wound Assessment Details Patient Name: JUNO, ALERS. Date of Service: 07/08/2015 10:00 AM Medical Record Number: [id number] Patient Account Number: [id number] Date of Birth/Sex: July 18, 1965 (50 y.o. Female) Treating RN: Afful, RN, BSN, Psychologist, clinical Primary Care Physician: [place], JULIE Other Clinician: Referring Physician: [place], JULIE Treating Physician/Extender: Rudene Re in Treatment: 23 Wound Status Wound Number: 2 Primary Pressure Ulcer Etiology: Wound Location: Sacrum - Midline Wound Open Wounding Event: Pressure Injury Status: Date Acquired: 12/02/2007 Comorbid Asthma, Chronic Obstructive Weeks Of Treatment: 23 History: Pulmonary Disease (COPD), Clustered Wound: No Paraplegia Photos Photo Uploaded By: Elpidio Eric on 07/08/2015 15:35:17 Wound Measurements Length: (cm) 1 Width: (cm) 1.5 Depth: (cm) 0.1 Area: (cm) 1.178 Volume: (cm) 0.118 % Reduction in Area: -56.2% % Reduction in Volume: -57.3% Epithelialization: None Tunneling: No Undermining: No Wound Description Classification: Category/Stage IV Wound Margin: Distinct, outline attached Exudate Amount: Small Exudate Type: Serosanguineous Exudate Color: red, brown Foul Odor After Cleansing: No Wound Bed Granulation Amount: Large (67-100%) Exposed Structure Granulation Quality: Pink, Pale Fascia Exposed: No Necrotic Amount: None Present (0%) Fat Layer Exposed: No Tendon Exposed: No Mcclain, Keyarra M. ([id number]) Muscle Exposed: No Joint Exposed: No Bone Exposed: No Limited to Skin Breakdown Periwound Skin Texture Texture Color No Abnormalities Noted: No No Abnormalities Noted: No Callus: No Atrophie Blanche: No Crepitus: No Cyanosis: No Excoriation: No Ecchymosis: No Fluctuance: No Erythema: No Friable: No Hemosiderin Staining: No Induration: No Mottled: No Localized Edema: No Pallor:  No Rash: No Rubor: No Scarring: No Temperature / Pain Moisture Tenderness on Palpation: Yes No Abnormalities Noted: No Dry / Scaly: No Maceration: Yes Moist: Yes Wound Preparation Ulcer Cleansing: Rinsed/Irrigated with Saline Topical Anesthetic Applied: Other: lidocaine 4%, Treatment Notes Wound #2 (Midline Sacrum) 1. Cleansed with: Clean wound with Normal Saline 4. Dressing Applied: Aquacel Ag 5. Secondary Dressing Applied Bordered Foam Dressing Electronic Signature(s) Signed: 07/08/2015 3:35:50 PM By: Elpidio Eric BSN, RN Entered By: Elpidio Eric on 07/08/2015 10:26:56 Pavel, [name] ([id number]) -------------------------------------------------------------------------------- Vitals Details Patient Name: ANALISE, GLOTFELTY. Date of Service: 07/08/2015 10:00 AM Medical Record Number: [id number] Patient Account Number: [id number] Date of Birth/Sex: Mar 30, 1966 (50 y.o. Female) Treating RN: Afful, RN, BSN, Rita Primary Care Physician: [place], JULIE Other Clinician: Referring Physician: [place], JULIE Treating Physician/Extender: Rudene Re in Treatment: 23 Vital Signs Time Taken: 10:06 Temperature (F): 97.9 Height (in): 63 Pulse (bpm): 71 Weight (lbs): 100 Respiratory Rate (breaths/min): 17 Body Mass Index (BMI): 17.7 Blood Pressure (mmHg): 111/68 Reference Range: 80 - 120 mg / dl Electronic Signature(s) Signed: 07/08/2015  3:35:50 PM By: Elpidio EricAfful, Rita BSN, RN Entered By: Elpidio EricAfful, Rita on 07/08/2015 10:06:29

## 2015-07-10 NOTE — Progress Notes (Signed)
[name] ([id number]) Visit Report for 07/08/2015 Chief Complaint Document Details Patient Name: [name], [name] Date of Service: 07/08/2015 10:00 AM Medical Record Patient Account Number: [id number] Number: Afful, [name], BSN, Treating [name]: [birth year] (50 y.o. [name] Date of Birth/Sex: Female) Other Clinician: Primary Care Physician: [name] Treating [name] Referring Physician: [place], [name] Physician/Extender: [name]: 23 Information Obtained from: Patient Chief Complaint Patient presents to the wound care center for a consult due non healing wound. 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 She comes in for a pressure injury to the right scapular region and the sacral region. She has this for several months. Electronic Signature(s) Signed: 07/08/2015 10:36:00 AM By: [name] Kanner MD, FACS Entered By: [name] on 07/08/2015 10:36:00 [name] ([id number]) -------------------------------------------------------------------------------- Debridement Details Patient Name: [name], [name] Date of Service: 07/08/2015 10:00 AM Medical Record Patient Account Number: [id number] Number: Afful, [name], BSN, Treating [name]: 22-May-1966 (50 y.o. [name] Date of Birth/Sex: Female) Other Clinician: Primary Care Physician: [name] Treating [name] Referring Physician: [place], [name] Physician/Extender: [name]: 23 Debridement Performed for Wound #1 Right Scapula Assessment: Performed By: Physician [name] Kanner, MD Debridement: Debridement Pre-procedure Yes Verification/Time Out Taken: Start Time: 10:25 Pain Control: Lidocaine 4% Topical Solution Level: Skin/Subcutaneous Tissue Total Area Debrided (L x 1.5 (cm) x 1 (cm) = 1.5 (cm) W): Tissue and other Non-Viable, Exudate, Fibrin/Slough, Subcutaneous material debrided: Instrument: Curette Bleeding:  Minimum Hemostasis Achieved: Pressure End Time: 10:30 Procedural Pain: 0 Post Procedural Pain: 0 Response to [name]: Procedure was tolerated well Post Debridement Measurements of Total Wound Length: (cm) 1.5 Stage: Category/Stage IV Width: (cm) 1 Depth: (cm) 0.6 Volume: (cm) 0.707 Post Procedure Diagnosis Same as Pre-procedure Electronic Signature(s) Signed: 07/08/2015 10:35:53 AM By: [name] Kanner MD, FACS Signed: 07/08/2015 3:35:50 PM By: [name] Previous Signature: 07/08/2015 10:35:40 AM Version By: [name] Kanner MD, FACS Entered By: [name] on 07/08/2015 10:35:53 [name] [name], [name] -------------------------------------------------------------------------------- HPI Details Patient Name: [name], [name] Date of Service: 07/08/2015 10:00 AM Medical Record Patient Account Number: [id number] Number: Afful, [name], BSN, Treating [name]: 01/14/1966 (50 y.o. 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 For about 4-5 months she's had a decubitus ulcer on the right scapular region and she has a couple of small areas which have opened out in the region of previous scar tissue in the sacral region. He does not have any other significant comorbidities. She has a good Roho cushion for her wheelchair and she has a specialized mattress for sleep. She has stopped smoking a year ago. 02/11/2015 -- she has not yet got her appointment with the plastic surgeons at [facility]. They did a x-ray of the sacral region this afternoon just before coming here. 02/18/2015 -- X-ray of the pelvis --  IMPRESSION:No acute osseous injury of the pelvis. If there is clinical concern regarding osteomyelitis, recommend further evaluation with CT. X-ray of the sacrum and coccyx IMPRESSION:No acute osseous abnormality of the sacrum. If there is further clinical concern regarding osteomyelitis of the sacrum, further evaluation with CT is recommended. 03/11/2015 -- she has yet to obtain the appointment  [facility]. She says she will be diligent and follow-up on this. 03/18/2015 -- she has an appointment to [facility] this coming [facility]. Also has some issues with her air mattress and is working on getting this replaced. 03/25/2015 -- her air mattress is working okay now and she does not yet have a firm appointment to see her plastic surgeons. 07/08/2015 -- we have not yet received any appointment regarding [facility] and I don't believe the patient is being very compliant though she is very polite. 04/22/2015 -- he have not seen her for about a month and this was due to the fact that she had bronchitis, pneumothorax and was [facility]. We do not have a plastic surgery opinion as of yet. 12/22 2016 -- has been several months now and she has not been very compliant in getting an opinion with the plastic surgeons [facility]. The appointments were canceled for various reasons and I have urged them to try and reschedule this as soon as possible. Electronic Signature(s) Signed: 07/08/2015 10:36:43 AM By: [name] KannerBritto, [name] Weathington MD, FACS Entered By: [name] KannerBritto, Gerline Ratto on 07/08/2015 10:36:42 [name], [name] -------------------------------------------------------------------------------- Physical Exam Details Patient Name: [name] MastersWADE, [name] Date of Service: 07/08/2015 10:00 AM Medical Record Patient Account Number: [id number] Number: Afful, [name], BSN, Treating [name]: Nov 27, 1965 (50 y.o. [place] Sinkita Date of  Birth/Sex: Female) Other Clinician: Primary Care Physician: [name] TerritoryMONACO, [name] Treating [name] KannerBritto, Kaida Games Referring Physician: [place], [name] Physician/Extender: [name]: 23 Constitutional . Pulse regular. Respirations normal and unlabored. Afebrile. . Eyes Nonicteric. Reactive to light. Ears, Nose, Mouth, and Throat Lips, teeth, and gums WNL.Marland Kitchen. Moist mucosa without lesions. Neck supple and nontender. No palpable supraclavicular or cervical adenopathy. Normal sized without goiter. Respiratory WNL. No retractions.. Cardiovascular Pedal Pulses WNL. No clubbing, cyanosis or edema. Lymphatic No adneopathy. No adenopathy. No adenopathy. Musculoskeletal Adexa without tenderness or enlargement.. Digits and nails w/o clubbing, cyanosis, infection, petechiae, ischemia, or inflammatory conditions.. Integumentary (Hair, Skin) No suspicious lesions. No crepitus or fluctuance. No peri-wound warmth or erythema. No masses.Marland Kitchen. Psychiatric Judgement and insight Intact.. No evidence of depression, anxiety, or agitation.. Notes the right scapular wound continues to have some debris and buildup in the subcutaneous region and I sharply debrided this with a curet. The sacral wound looks good and there is no palpable bone protruding today. Electronic Signature(s) Signed: 07/08/2015 10:37:20 AM By: [name] KannerBritto, Jaquis Picklesimer MD, FACS Entered By: [name] KannerBritto, Jovanny Stephanie on 07/08/2015 10:37:19 [name] MastersWADE, [name] ([id number]) -------------------------------------------------------------------------------- Physician Orders Details Patient Name: [name] MastersWADE, [name] Date of Service: 07/08/2015 10:00 AM Medical Record Patient Account Number: [id number] Number: Afful, [name], BSN, Treating [name]: Nov 27, 1965 (50 y.o. [place] Sinkita Date of Birth/Sex: Female) Other Clinician: Primary Care Physician: [name] TerritoryMONACO, [name] Treating [name] KannerBritto, Jessicia Napolitano Referring Physician: [place], [name] Physician/Extender: Tania AdeWeeks in [name]: [id number] Verbal / Phone Orders:  Yes Clinician: Afful, [name], BSN, Rita Read Back and Verified: Yes Diagnosis Coding Wound Cleansing Wound #1 Right Scapula o Cleanse wound with mild soap and water o May Shower, gently pat wound dry prior to applying new dressing. Wound #2 Midline Sacrum o Cleanse wound with mild soap and water o May Shower, gently pat wound dry prior to applying new dressing. Primary Wound Dressing Wound #1 Right Scapula o Aquacel Ag Wound #2 Midline Sacrum o Aquacel Ag - rope Secondary Dressing Wound #1 Right Scapula o Boardered Foam Dressing Wound #2 Midline Sacrum o Boardered Foam Dressing Dressing Change Frequency  Wound #1 Right Scapula o Change dressing every other day. Wound #2 Midline Sacrum o Change dressing every other day. Follow-up Appointments Wound #1 Right Scapula o Return Appointment in 2 [name]. [name], [name] ([id number]) Wound #2 Midline Sacrum o Return Appointment in 2 [name]. Off-Loading Wound #1 Right Scapula o Turn and reposition every 2 hours Wound #2 Midline Sacrum o Turn and reposition every 2 hours Home Health Wound #1 Right Scapula o Continue Home Health Visits - [facility] o Home Health Nurse may visit PRN to address patientos wound care needs. o FACE TO FACE ENCOUNTER: MEDICARE and MEDICAID PATIENTS: I certify that this patient is under my care and that I had a face-to-face encounter that meets the physician face-to-face encounter requirements with this patient on this date. The encounter with the patient was in whole or in part for the following MEDICAL CONDITION: (primary reason for Home Healthcare) MEDICAL NECESSITY: I certify, that based on my findings, NURSING services are a medically necessary home health service. HOME BOUND STATUS: I certify that my clinical findings support that this patient is homebound (i.e., Due to illness or injury, pt requires aid of supportive devices such as crutches, cane,  wheelchairs, walkers, the use of special transportation or the assistance of another person to leave their place of residence. There is a normal inability to leave the home and doing so requires considerable and taxing effort. Other absences are for medical reasons / religious services and are infrequent or of short duration when for other reasons). o If current dressing causes regression in wound condition, may D/C ordered dressing product/s and apply Normal Saline Moist Dressing daily until next Wound Healing Center / Other MD appointment. Notify Wound Healing Center of regression in wound condition at [phone]. o Please direct any NON-WOUND related issues/requests for orders to patient's Primary Care Physician Wound #2 Midline Sacrum o Continue Home Health Visits - [facility] o Home Health Nurse may visit PRN to address patientos wound care needs. o FACE TO FACE ENCOUNTER: MEDICARE and MEDICAID PATIENTS: I certify that this patient is under my care and that I had a face-to-face encounter that meets the physician face-to-face encounter requirements with this patient on this date. The encounter with the patient was in whole or in part for the following MEDICAL CONDITION: (primary reason for Home Healthcare) MEDICAL NECESSITY: I certify, that based on my findings, NURSING services are a medically necessary home health service. HOME BOUND STATUS: I certify that my clinical findings support that this patient is homebound (i.e., Due to illness or injury, pt requires aid of supportive devices such as crutches, cane, wheelchairs, walkers, the use of special transportation or the assistance of another person to leave their place of residence. There is a normal inability to leave the home and doing so requires considerable and taxing effort. Other absences are for medical reasons / religious services and are infrequent or of short duration when for other reasons). [name], [name] ([id number]) o If current dressing causes regression in wound condition, may D/C ordered dressing product/s and apply Normal Saline Moist Dressing daily until next Wound Healing Center / Other MD appointment. Notify Wound Healing Center of regression in wound condition at [phone]. o Please direct any NON-WOUND related issues/requests for orders to patient's Primary Care Physician Electronic Signature(s) Signed: 07/08/2015 3:35:50 PM By: [name] Signed: 07/09/2015 5:05:49 PM By: [name] Kanner MD, FACS Entered By: [name] on 07/08/2015 10:34:06 Adriance, Maily M. ([id number]) -------------------------------------------------------------------------------- Problem  List Details Patient Name: [name], [name] Date of Service: 07/08/2015 10:00 AM Medical Record Patient Account Number: [id number] Number: Afful, [name], BSN, Treating [name]: 02-27-1966 (50 y.o. 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 Date of Service: 07/08/2015 10:00 AM Medical Record Patient Account Number: [id number] Number: Afful, [name], BSN, Treating [name]: 09-Dec-1965 (50 y.o.  [name] Date of  Birth/Sex: Female) Other Clinician: Primary Care Physician: [name] Territory, Raynelle Fanning Treating [name] Referring Physician: [place], [name] Physician/Extender: [name]: 23 Subjective Chief Complaint Information obtained from Patient Patient presents to the wound care center for a consult due non healing wound. 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 She comes in for a pressure injury to the right scapular region and the sacral region. She has this for several months. 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 For about 4-5 months she's had a decubitus ulcer on the right scapular region and she has a couple of small areas which have opened out in the region of previous scar tissue in the sacral region. He does not have any other significant comorbidities. She has a good Roho cushion for her wheelchair and she has a specialized mattress for sleep. She has stopped smoking a year ago. 02/11/2015 -- she has not yet got her appointment with the plastic surgeons at [facility]. They did a x-ray of the sacral region this afternoon just before coming here. 02/18/2015 -- X-ray of the pelvis -- IMPRESSION:No acute osseous injury of the pelvis. If there is clinical concern regarding osteomyelitis, recommend further evaluation with CT. X-ray of the sacrum and coccyx IMPRESSION:No acute osseous abnormality of the sacrum. If there is further clinical concern regarding osteomyelitis of the sacrum, further evaluation with CT is recommended. 03/11/2015 -- she has yet to obtain the appointment with the plastic surgeons at [facility]. She says she will be diligent and follow-up on this. 03/18/2015 -- she has an appointment to see the plastic surgeons at [facility] this coming Monday. Also has some  issues with her air mattress and is working on getting this replaced. 03/25/2015 -- her air mattress is working okay now and she does not yet have a firm appointment to see her plastic surgeons. 07/08/2015 -- we have not yet received any appointment regarding her plastic surgery at [facility] and I don't believe the patient is being very compliant though she is very polite. 04/22/2015 -- he have not seen her for about a month and this was due to the fact that she had bronchitis, pneumothorax and was admitted to [facility]. We do not have a plastic surgery opinion as of yet. 12/22 2016 -- has been several months now and she has not been very compliant in getting an opinion with the plastic surgeons at [facility]. The  appointments were canceled for various reasons and I have urged them to try and reschedule this as soon as possible. [name], [name] ([id number]) Objective Constitutional Pulse regular. Respirations normal and unlabored. Afebrile. Vitals Time Taken: 10:06 AM, Height: 63 in, Weight: 100 lbs, BMI: 17.7, Temperature: 97.9 F, Pulse: 71 bpm, Respiratory Rate: 17 breaths/min, Blood Pressure: 111/68 mmHg. Eyes Nonicteric. Reactive to light. Ears, Nose, Mouth, and Throat Lips, teeth, and gums WNL.Marland Kitchen Moist mucosa without lesions. Neck supple and nontender. No palpable supraclavicular or cervical adenopathy. Normal sized without goiter. Respiratory WNL. No retractions.. Cardiovascular Pedal Pulses WNL. No clubbing, cyanosis or edema. Lymphatic No adneopathy. No adenopathy. No adenopathy. Musculoskeletal Adexa without tenderness or enlargement.. Digits and nails w/o clubbing, cyanosis, infection, petechiae, ischemia, or inflammatory conditions.Marland Kitchen Psychiatric Judgement and insight Intact.. No evidence of depression, anxiety, or agitation.. General Notes: the right scapular wound continues to have some debris and buildup in the subcutaneous region and I sharply debrided this  with a curet. The sacral wound looks good and there is no palpable bone protruding today. Integumentary (Hair, Skin) No suspicious lesions. No crepitus or fluctuance. No peri-wound warmth or erythema. No masses.. Wound #1 status is Open. Original cause of wound was Pressure Injury. The wound is located on the Right Scapula. The wound measures 1.5cm length x 1cm width x 0.5cm depth; 1.178cm^2 area and 0.589cm^3 Amalfitano, [name] ([id number]) volume. There is bone exposed. There is no tunneling or undermining noted. There is a large amount of serosanguineous drainage noted. The wound margin is flat and intact. There is large (67-100%) red, pink granulation within the wound bed. There is no necrotic tissue within the wound bed. The periwound skin appearance exhibited: Localized Edema, Maceration, Moist. The periwound skin appearance did not exhibit: Callus, Crepitus, Excoriation, Fluctuance, Friable, Induration, Rash, Scarring, Dry/Scaly, Atrophie Blanche, Cyanosis, Ecchymosis, Hemosiderin Staining, Mottled, Pallor, Rubor, Erythema. Periwound temperature was noted as No Abnormality. The periwound has tenderness on palpation. Wound #2 status is Open. Original cause of wound was Pressure Injury. The wound is located on the Midline Sacrum. The wound measures 1cm length x 1.5cm width x 0.1cm depth; 1.178cm^2 area and 0.118cm^3 volume. The wound is limited to skin breakdown. There is no tunneling or undermining noted. There is a small amount of serosanguineous drainage noted. The wound margin is distinct with the outline attached to the wound base. There is large (67-100%) pink, pale granulation within the wound bed. There is no necrotic tissue within the wound bed. The periwound skin appearance exhibited: Maceration, Moist. The periwound skin appearance did not exhibit: Callus, Crepitus, Excoriation, Fluctuance, Friable, Induration, Localized Edema, Rash, Scarring, Dry/Scaly, Atrophie Blanche,  Cyanosis, Ecchymosis, Hemosiderin Staining, Mottled, Pallor, Rubor, Erythema. The periwound has tenderness on palpation. Assessment Active Problems ICD-10 L89.114 - Pressure ulcer of right upper back, stage 4 L89.154 - Pressure ulcer of sacral region, stage 4 G82.52 - Quadriplegia, C1-C4 incomplete E44.0 - Moderate protein-calorie malnutrition This pleasant 50 year old quadriplegic has had chronic problems which need to be addressed by a plastic surgeon but she has just not been able to get appointments appropriately and at times missed appointments because of illness or noncompliance. As a result we have not been able to make much headway and it's been 5 months since we've been asking her to see the plastic surgeons. She is under complex care and we will continue to support her to the best of our ability. Procedures Wound #1 Wound #1 is a Pressure Ulcer located on the Right Scapula . There  was a Skin/Subcutaneous Tissue [name], [name] ([id number]) Debridement [phone]) debridement with total area of 1.5 sq cm performed by [name] Kanner, MD. with the following instrument(s): Curette to remove Non-Viable tissue/material including Exudate, Fibrin/Slough, and Subcutaneous after achieving pain control using Lidocaine 4% Topical Solution. A time out was conducted prior to the start of the procedure. A Minimum amount of bleeding was controlled with Pressure. The procedure was tolerated well with a pain level of 0 throughout and a pain level of 0 following the procedure. Post Debridement Measurements: 1.5cm length x 1cm width x 0.6cm depth; 0.707cm^3 volume. Post debridement Stage noted as Category/Stage IV. Post procedure Diagnosis Wound #1: Same as Pre-Procedure Plan Wound Cleansing: Wound #1 Right Scapula: Cleanse wound with mild soap and water May Shower, gently pat wound dry prior to applying new dressing. Wound #2 Midline Sacrum: Cleanse wound with mild soap and water May  Shower, gently pat wound dry prior to applying new dressing. Primary Wound Dressing: Wound #1 Right Scapula: Aquacel Ag Wound #2 Midline Sacrum: Aquacel Ag - rope Secondary Dressing: Wound #1 Right Scapula: Boardered Foam Dressing Wound #2 Midline Sacrum: Boardered Foam Dressing Dressing Change Frequency: Wound #1 Right Scapula: Change dressing every other day. Wound #2 Midline Sacrum: Change dressing every other day. Follow-up Appointments: Wound #1 Right Scapula: Return Appointment in 2 [name]. Wound #2 Midline Sacrum: Return Appointment in 2 [name]. Off-Loading: Wound #1 Right Scapula: Turn and reposition every 2 hours Wound #2 Midline Sacrum: Turn and reposition every 2 hours Home Health: Wound #1 Right Scapula: KAMDYN, COVEL ([id number]) Continue Home Health Visits - [facility] Home Health Nurse may visit PRN to address patient s wound care needs. FACE TO FACE ENCOUNTER: MEDICARE and MEDICAID PATIENTS: I certify that this patient is under my care and that I had a face-to-face encounter that meets the physician face-to-face encounter requirements with this patient on this date. The encounter with the patient was in whole or in part for the following MEDICAL CONDITION: (primary reason for Home Healthcare) MEDICAL NECESSITY: I certify, that based on my findings, NURSING services are a medically necessary home health service. HOME BOUND STATUS: I certify that my clinical findings support that this patient is homebound (i.e., Due to illness or injury, pt requires aid of supportive devices such as crutches, cane, wheelchairs, walkers, the use of special transportation or the assistance of another person to leave their place of residence. There is a normal inability to leave the home and doing so requires considerable and taxing effort. Other absences are for medical reasons / religious services and are infrequent or of short duration when for other reasons). If  current dressing causes regression in wound condition, may D/C ordered dressing product/s and apply Normal Saline Moist Dressing daily until next Wound Healing Center / Other MD appointment. Notify Wound Healing Center of regression in wound condition at [phone]. Please direct any NON-WOUND related issues/requests for orders to patient's Primary Care Physician Wound #2 Midline Sacrum: Continue Home Health Visits - [facility] Home Health Nurse may visit PRN to address patient s wound care needs. FACE TO FACE ENCOUNTER: MEDICARE and MEDICAID PATIENTS: I certify that this patient is under my care and that I had a face-to-face encounter that meets the physician face-to-face encounter requirements with this patient on this date. The encounter with the patient was in whole or in part for the following MEDICAL CONDITION: (primary reason for Home Healthcare) MEDICAL NECESSITY: I certify, that based on my findings, NURSING  services are a medically necessary home health service. HOME BOUND STATUS: I certify that my clinical findings support that this patient is homebound (i.e., Due to illness or injury, pt requires aid of supportive devices such as crutches, cane, wheelchairs, walkers, the use of special transportation or the assistance of another person to leave their place of residence. There is a normal inability to leave the home and doing so requires considerable and taxing effort. Other absences are for medical reasons / religious services and are infrequent or of short duration when for other reasons). If current dressing causes regression in wound condition, may D/C ordered dressing product/s and apply Normal Saline Moist Dressing daily until next Wound Healing Center / Other MD appointment. Notify Wound Healing Center of regression in wound condition at [phone]. 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 As a result we have not been able to make much headway and it's been 5 months since we've been asking her to see the plastic surgeons. She is under complex care and we will continue to support her to the best of our ability. ALYSSABETH, BRUSTER ([id number]) Electronic Signature(s) Signed: 07/08/2015 10:38:36 AM By: [name] Kanner MD, FACS Entered By: [name] on 07/08/2015 10:38:36 [place], [name] ([id number]) -------------------------------------------------------------------------------- SuperBill Details Patient Name: DANELLE, CURIALE. Date of Service: 07/08/2015 Medical Record Patient Account Number: [id number] Number: Afful, [name], BSN, Treating [name]: 09/06/1965 (51 y.o. 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, C1-C4 incomplete E44.0 Moderate protein-calorie malnutrition Facility Procedures CPT4 Code: [id number] Description: 11042 - DEB SUBQ TISSUE 20 SQ CM/< ICD-10 Description Diagnosis L89.114 Pressure ulcer of right upper back, 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

## 2015-07-22 ENCOUNTER — Ambulatory Visit: Payer: Medicare Other | Admitting: Surgery

## 2015-07-29 ENCOUNTER — Encounter: Payer: Medicare Other | Admitting: Surgery

## 2015-07-29 DIAGNOSIS — L89159 Pressure ulcer of sacral region, unspecified stage: Secondary | ICD-10-CM | POA: Diagnosis not present

## 2015-07-30 NOTE — Progress Notes (Addendum)
[name], [name] ([id number]) Visit Report for 07/29/2015 Chief Complaint Document Details Patient Name: [name], [name] 07/29/2015 10:45 Date of Service: AM Medical Record [id number] Number: Patient Account Number: [id number] 03/28/1966 (50 y.o. Treating RN: [name] Date of Birth/Sex: Female) Other Clinician: Primary Care Physician: [name] Treating [name] Referring Physician: [place], [name] Physician/Extender: Weeks in Treatment: 26 Information Obtained from: Patient Chief Complaint Patient presents to the wound care center for a consult due non healing wound. 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 She comes in for a pressure injury to the right scapular region and the sacral region. She has this for several months. Electronic Signature(s) Signed: 07/29/2015 11:28:40 AM By: [name] Kanner MD, FACS Entered By: [name] on 07/29/2015 11:28:40 [name], [name] -------------------------------------------------------------------------------- HPI Details Patient Name: [name], [name] 07/29/2015 10:45 Date of Service: AM Medical Record [id number] Number: Patient Account Number: [id number] 1965/09/18 (50 y.o. 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 For about 4-5 months she's had a decubitus ulcer on the right scapular region and she has a couple of small areas which have opened out in the region of previous scar tissue in the sacral region. He does not have any other significant comorbidities. She has a good  Roho cushion for her wheelchair and she has a specialized mattress for sleep. She has stopped smoking a year ago. 02/11/2015 -- she has not yet got her appointment with the plastic surgeons at [facility]. They did a x-ray of the sacral region this afternoon just before coming here. 02/18/2015 -- X-ray of the pelvis -- IMPRESSION:No acute osseous injury of the pelvis. If there is clinical concern regarding osteomyelitis, recommend further evaluation with CT. X-ray of the sacrum and coccyx IMPRESSION:No acute osseous abnormality of the sacrum. If there is further clinical concern regarding osteomyelitis of the sacrum, further evaluation with CT is recommended. 03/11/2015 -- she has yet to obtain the appointment with the plastic surgeons at [facility]. She says she will be diligent and follow-up on this. 03/18/2015 -- she has an appointment to see the plastic surgeons at [facility] this coming Monday. Also has some issues with her air mattress and is working on getting this replaced. 03/25/2015 -- her air mattress is working okay now and she does not yet have a firm appointment to see her plastic surgeons. 07/08/2015 -- we have not yet received any appointment regarding her plastic surgery at [facility] and I don't believe the patient is being very compliant though she is very polite. 04/22/2015 -- he have not seen her for about a month and this was due to the fact that she had bronchitis, pneumothorax and was admitted to [facility]. We do not have a plastic surgery opinion as of yet. 12/22 2016 -- has been several months now and she has not been very compliant in getting an opinion with the plastic surgeons at [facility]. The appointments were canceled for various reasons and I have urged them to try and reschedule this as soon as possible. Electronic Signature(s) Signed: 07/29/2015 11:28:49 AM By: [name] Kanner MD, FACS Entered By: [name] on 07/29/2015 11:28:48 Utley, Lorissa M.  (  [id number]) -------------------------------------------------------------------------------- Physical Exam Details Patient Name: [name], [name] 07/29/2015 10:45 Date of Service: AM Medical Record [id number] Number: Patient Account Number: [id number] 21-Jan-1966 (50 y.o. Treating RN: [name] Date of Birth/Sex: Female) Other Clinician: Primary Care Physician: [name] Treating [name] Referring Physician: [place], [name] Physician/Extender: Weeks in Treatment: 26 Constitutional . Pulse regular. Respirations normal and unlabored. Afebrile. . Eyes Nonicteric. Reactive to light. Ears, Nose, Mouth, and Throat Lips, teeth, and gums WNL.Marland Kitchen Moist mucosa without lesions. Neck supple and nontender. No palpable supraclavicular or cervical adenopathy. Normal sized without goiter. Respiratory WNL. No retractions.. Cardiovascular Pedal Pulses WNL. No clubbing, cyanosis or edema. Chest Breasts symmetical and no nipple discharge.. Breast tissue WNL, no masses, lumps, or tenderness.. Lymphatic No adneopathy. No adenopathy. No adenopathy. Musculoskeletal Adexa without tenderness or enlargement.. Digits and nails w/o clubbing, cyanosis, infection, petechiae, ischemia, or inflammatory conditions.. Integumentary (Hair, Skin) No suspicious lesions. No crepitus or fluctuance. No peri-wound warmth or erythema. No masses.Marland Kitchen Psychiatric Judgement and insight Intact.. No evidence of depression, anxiety, or agitation.. Notes the wound on the right scapular area continues to have undermining and today there is no slough. The sacral wound looks fine and there is no palpable bone to probe. Electronic Signature(s) Signed: 07/29/2015 11:29:38 AM By: [name] Kanner MD, FACS Entered By: [name] on 07/29/2015 11:29:37 [name] ([id number]) -------------------------------------------------------------------------------- Physician Orders Details Patient Name: [name], [name]  07/29/2015 10:45 Date of Service: AM Medical Record [id number] Number: Patient Account Number: [id number] 04-May-1966 (49 y.o. Treating RN: [name] Date of Birth/Sex: Female) Other Clinician: Primary Care Physician: [name] Treating [name] Referring Physician: [place], [name] Physician/Extender: Weeks in Treatment: 27 Verbal / Phone Orders: Yes Clinician: Leonard [name] Read Back and Verified: No Diagnosis Coding Wound Cleansing Wound #1 Right Scapula o Cleanse wound with mild soap and water o May Shower, gently pat wound dry prior to applying new dressing. Wound #2 Midline Sacrum o Cleanse wound with mild soap and water o May Shower, gently pat wound dry prior to applying new dressing. Primary Wound Dressing Wound #1 Right Scapula o Other: - sorbact Wound #2 Midline Sacrum o Aquacel Ag - rope Secondary Dressing Wound #1 Right Scapula o Boardered Foam Dressing Wound #2 Midline Sacrum o Boardered Foam Dressing Dressing Change Frequency Wound #1 Right Scapula o Change dressing every other day. Wound #2 Midline Sacrum o Change dressing every other day. Follow-up Appointments Wound #1 Right Scapula o Return Appointment in 2 weeks. [name], [name] ([id number]) Wound #2 Midline Sacrum o Return Appointment in 2 weeks. Off-Loading Wound #1 Right Scapula o Turn and reposition every 2 hours Wound #2 Midline Sacrum o Turn and reposition every 2 hours Home Health Wound #1 Right Scapula o Continue Home Health Visits - [facility] o Home Health Nurse may visit PRN to address patientos wound care needs. o FACE TO FACE ENCOUNTER: MEDICARE and MEDICAID PATIENTS: I certify that this patient is under my care and that I had a face-to-face encounter that meets the physician face-to-face encounter requirements with this patient on this date. The encounter with the patient was in whole or in part for the following  MEDICAL CONDITION: (primary reason for Home Healthcare) MEDICAL NECESSITY: I certify, that based on my findings, NURSING services are a medically necessary home health service. HOME BOUND STATUS: I certify that my clinical findings support that this patient is homebound (i.e., Due to illness or injury, pt requires aid of supportive devices such as crutches, cane, wheelchairs, walkers,  the use of special transportation or the assistance of another person to leave their place of residence. There is a normal inability to leave the home and doing so requires considerable and taxing effort. Other absences are for medical reasons / religious services and are infrequent or of short duration when for other reasons). o If current dressing causes regression in wound condition, may D/C ordered dressing product/s and apply Normal Saline Moist Dressing daily until next Wound Healing Center / Other MD appointment. Notify Wound Healing Center of regression in wound condition at [phone]. o Please direct any NON-WOUND related issues/requests for orders to patient's Primary Care Physician Wound #2 Midline Sacrum o Continue Home Health Visits - [facility] o Home Health Nurse may visit PRN to address patientos wound care needs. o FACE TO FACE ENCOUNTER: MEDICARE and MEDICAID PATIENTS: I certify that this patient is under my care and that I had a face-to-face encounter that meets the physician face-to-face encounter requirements with this patient on this date. The encounter with the patient was in whole or in part for the following MEDICAL CONDITION: (primary reason for Home Healthcare) MEDICAL NECESSITY: I certify, that based on my findings, NURSING services are a medically necessary home health service. HOME BOUND STATUS: I certify that my clinical findings support that this patient is homebound (i.e., Due to illness or injury, pt requires aid of supportive devices such as crutches,  cane, wheelchairs, walkers, the use of special transportation or the assistance of another person to leave their place of residence. There is a normal inability to leave the home and doing so requires considerable and taxing effort. Other absences are for medical reasons / religious services and are infrequent or of short duration when for other reasons). [name], [name] ([id number]) o If current dressing causes regression in wound condition, may D/C ordered dressing product/s and apply Normal Saline Moist Dressing daily until next Wound Healing Center / Other MD appointment. Notify Wound Healing Center of regression in wound condition at [phone]. o Please direct any NON-WOUND related issues/requests for orders to patient's Primary Care Physician Electronic Signature(s) Signed: 07/29/2015 4:27:41 PM By: [name] Kanner MD, FACS Signed: 07/29/2015 4:29:37 PM By: Lucrezia Starch RN, Sendra Entered By: Lucrezia Starch RN, Sendra on 07/29/2015 11:12:22 [name], [name] ([id number]) -------------------------------------------------------------------------------- Problem List Details Patient Name: [name], [name] 07/29/2015 10:45 Date of Service: AM Medical Record [id number] Number: Patient Account Number: [id number] 1966-04-08 (49 y.o. 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 Treating RN: [name] Date of Birth/Sex: Female) Other Clinician: Primary Care Physician: [name] Treating [name] Referring Physician: [place], [name] Physician/Extender: Tania Ade in Treatment: 26 Subjective Chief Complaint Information obtained from Patient Patient presents to the wound care center for a consult due non healing wound. 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 She comes in for a pressure injury to the right scapular region and the sacral region. She has this for several months. 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 For about 4-5 months she's had a decubitus ulcer on the right scapular region and she has a couple of small areas which have opened out in the region of previous scar tissue in the sacral region. He does not have any other significant comorbidities. She has a good Roho cushion for her wheelchair and she has a specialized mattress for sleep. She has stopped smoking a year ago. 02/11/2015 -- she has not yet got her appointment with the plastic surgeons at [facility]. They did a x-ray of the sacral region this afternoon just before coming here. 02/18/2015 -- X-ray of the pelvis -- IMPRESSION:No acute osseous injury of the pelvis. If there is clinical concern regarding osteomyelitis, recommend further evaluation with CT. X-ray of the sacrum and coccyx IMPRESSION:No acute osseous abnormality of the sacrum. If there  is further clinical concern regarding osteomyelitis of the sacrum, further evaluation with CT is recommended. 03/11/2015 -- she has yet to obtain the appointment with the plastic surgeons at [facility]. She says she will be diligent and follow-up on this. 03/18/2015 -- she has an appointment to see the plastic surgeons at [facility] this coming Monday. Also has some issues with her air mattress and is working on getting this replaced. 03/25/2015 -- her air mattress is working okay now and she does not yet have a firm appointment to see her plastic surgeons. 07/08/2015 -- we have not yet received any appointment regarding her plastic surgery at [facility] and I don't believe the patient is being very compliant though she is very polite. 04/22/2015 -- he have not seen her for about a month and this was due to the fact that she had bronchitis, pneumothorax and was admitted to [facility]. We do not have a plastic surgery opinion as of yet. 12/22 2016 -- has been several months now and she has not been very compliant in getting an opinion with the plastic surgeons at [facility]. The appointments were canceled for various reasons and I have urged them to try and reschedule this as soon as possible. [name], BABINSKI M. ([id number]) Objective Constitutional Pulse regular. Respirations normal and unlabored. Afebrile. Vitals Time Taken: 11:20 AM, Height: 63 in, Weight: 100 lbs, BMI: 17.7, Temperature: 97.9 F. Eyes Nonicteric. Reactive to light. Ears, Nose, Mouth, and Throat Lips, teeth, and gums WNL.Marland Kitchen Moist mucosa without lesions. Neck supple and nontender. No palpable supraclavicular or cervical adenopathy. Normal sized without goiter. Respiratory WNL. No retractions.. Cardiovascular Pedal Pulses WNL. No clubbing, cyanosis or edema. Chest Breasts symmetical and no nipple discharge.. Breast tissue WNL, no masses, lumps, or tenderness.. Lymphatic No adneopathy. No adenopathy. No  adenopathy. Musculoskeletal Adexa without tenderness or enlargement.. Digits and nails w/o clubbing, cyanosis, infection, petechiae, ischemia, or inflammatory conditions.Marland Kitchen Psychiatric Judgement and insight Intact.. No evidence of depression, anxiety, or agitation.. General Notes: the wound on the right scapular area continues to have undermining and today there is no slough. The sacral wound looks fine and there is no palpable bone to probe. Integumentary (Hair, Skin) No suspicious lesions. No crepitus or fluctuance. No peri-wound warmth or erythema. No masses.. Wound #1 status is Open. Original cause of wound was Pressure Injury. The wound is  located on the Right ADALAI, PERL M. ([id number]) Scapula. The wound measures 1cm length x 0.5cm width x 0.5cm depth; 0.393cm^2 area and 0.196cm^3 volume. There is bone exposed. There is no tunneling or undermining noted. There is a large amount of serosanguineous drainage noted. The wound margin is flat and intact. There is large (67-100%) red, pink granulation within the wound bed. There is a small (1-33%) amount of necrotic tissue within the wound bed including Adherent Slough. The periwound skin appearance did not exhibit: Callus, Crepitus, Excoriation, Fluctuance, Friable, Induration, Localized Edema, Rash, Scarring, Dry/Scaly, Maceration, Moist, Atrophie Blanche, Cyanosis, Ecchymosis, Hemosiderin Staining, Mottled, Pallor, Rubor, Erythema. Periwound temperature was noted as No Abnormality. The periwound has tenderness on palpation. Wound #2 status is Open. Original cause of wound was Pressure Injury. The wound is located on the Midline Sacrum. The wound measures 0.5cm length x 0.5cm width x 0.1cm depth; 0.196cm^2 area and 0.02cm^3 volume. The wound is limited to skin breakdown. There is no tunneling or undermining noted. There is a small amount of serosanguineous drainage noted. The wound margin is distinct with the outline attached to the wound  base. There is large (67-100%) pink, pale granulation within the wound bed. There is no necrotic tissue within the wound bed. The periwound skin appearance exhibited: Maceration, Moist. The periwound skin appearance did not exhibit: Callus, Crepitus, Excoriation, Fluctuance, Friable, Induration, Localized Edema, Rash, Scarring, Dry/Scaly, Atrophie Blanche, Cyanosis, Ecchymosis, Hemosiderin Staining, Mottled, Pallor, Rubor, Erythema. The periwound has tenderness on palpation. Assessment Active Problems ICD-10 L89.114 - Pressure ulcer of right upper back, stage 4 L89.154 - Pressure ulcer of sacral region, stage 4 G82.52 - Quadriplegia, C1-C4 incomplete E44.0 - Moderate protein-calorie malnutrition Plan Wound Cleansing: Wound #1 Right Scapula: Cleanse wound with mild soap and water May Shower, gently pat wound dry prior to applying new dressing. Wound #2 Midline Sacrum: Cleanse wound with mild soap and water May Shower, gently pat wound dry prior to applying new dressing. Primary Wound Dressing: Wound #1 Right Scapula: Other: - sorbact Wound #2 Midline Sacrum: ZOFIA, PECKINPAUGH. ([id number]) Aquacel Ag - rope Secondary Dressing: Wound #1 Right Scapula: Boardered Foam Dressing Wound #2 Midline Sacrum: Boardered Foam Dressing Dressing Change Frequency: Wound #1 Right Scapula: Change dressing every other day. Wound #2 Midline Sacrum: Change dressing every other day. Follow-up Appointments: Wound #1 Right Scapula: Return Appointment in 2 weeks. Wound #2 Midline Sacrum: Return Appointment in 2 weeks. Off-Loading: Wound #1 Right Scapula: Turn and reposition every 2 hours Wound #2 Midline Sacrum: Turn and reposition every 2 hours Home Health: Wound #1 Right Scapula: Continue Home Health Visits - [facility] Home Health Nurse may visit PRN to address patient s wound care needs. FACE TO FACE ENCOUNTER: MEDICARE and MEDICAID PATIENTS: I certify that this patient is  under my care and that I had a face-to-face encounter that meets the physician face-to-face encounter requirements with this patient on this date. The encounter with the patient was in whole or in part for the following MEDICAL CONDITION: (primary reason for Home Healthcare) MEDICAL NECESSITY: I certify, that based on my findings, NURSING services are a medically necessary home health service. HOME BOUND STATUS: I certify that my clinical findings support that this patient is homebound (i.e., Due to illness or injury, pt requires aid of supportive devices such as crutches, cane, wheelchairs, walkers, the use of special transportation or the assistance of another person to leave their place of residence. There is a normal inability to leave the home and  doing so requires considerable and taxing effort. Other absences are for medical reasons / religious services and are infrequent or of short duration when for other reasons). If current dressing causes regression in wound condition, may D/C ordered dressing product/s and apply Normal Saline Moist Dressing daily until next Wound Healing Center / Other MD appointment. Notify Wound Healing Center of regression in wound condition at [phone]. Please direct any NON-WOUND related issues/requests for orders to patient's Primary Care Physician Wound #2 Midline Sacrum: Continue Home Health Visits - [facility] Home Health Nurse may visit PRN to address patient s wound care needs. FACE TO FACE ENCOUNTER: MEDICARE and MEDICAID PATIENTS: I certify that this patient is under my care and that I had a face-to-face encounter that meets the physician face-to-face encounter requirements with this patient on this date. The encounter with the patient was in whole or in part for the following MEDICAL CONDITION: (primary reason for Home Healthcare) MEDICAL NECESSITY: I certify, that based on my findings, NURSING services are a medically necessary home  health service. HOME BOUND STATUS: I certify that my clinical findings support that this patient is homebound (i.e., Due to illness or injury, pt requires aid of supportive devices such as crutches, cane, wheelchairs, walkers, the use of special transportation or the assistance of another person to leave their place of residence. There is a normal inability to leave the home and doing so requires considerable and taxing effort. Other absences are OLIA, HINDERLITER ([id number]) for medical reasons / religious services and are infrequent or of short duration when for other reasons). If current dressing causes regression in wound condition, may D/C ordered dressing product/s and apply Normal Saline Moist Dressing daily until next Wound Healing Center / Other MD appointment. Notify Wound Healing Center of regression in wound condition at [phone]. Please direct any NON-WOUND related issues/requests for orders to patient's Primary Care Physician This pleasant 50 year old quadriplegic has had chronic problems which need to be addressed by a plastic surgeon but she has just not been able to get appointments appropriately and at times missed appointments because of illness or noncompliance. As a result we have not been able to make much headway and it's been 5 months since we've been asking her to see the plastic surgeons. She is under complex care and we will continue to support her to the best of our ability. Electronic Signature(s) Signed: 07/30/2015 10:47:43 AM By: [name] Kanner MD, FACS Previous Signature: 07/29/2015 11:30:50 AM Version By: [name] Kanner MD, FACS Entered By: [name] on 07/30/2015 10:47:43 Martone, [name] ([id number]) -------------------------------------------------------------------------------- SuperBill Details Patient Name: CHAVIE, KOLINSKI. Date of Service: 07/29/2015 Medical Record Number: [id number] Patient Account Number: [id number] Date of Birth/Sex:  Mar 18, 1966 (50 y.o. 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, C1-C4 incomplete E44.0 Moderate protein-calorie malnutrition Facility Procedures CPT4 Code: 40102725 Description: 99214 - WOUND CARE VISIT-LEV 4 EST PT Modifier: Quantity: 1 Physician Procedures CPT4 Code: 3664403 Description: 99213 - WC PHYS LEVEL 3 - EST PT ICD-10 Description Diagnosis L89.114 Pressure ulcer of right upper back, stage 4 L89.154 Pressure ulcer of sacral region, 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

## 2015-07-30 NOTE — Progress Notes (Signed)
[name], [name] ([id number]) Visit Report for 07/29/2015 Arrival Information Details Patient Name: [name], [name] 07/29/2015 10:45 Date of Service: AM Medical Record [id number] Number: Patient Account Number: [id number] August 03, 1965 (50 y.o. Treating RN: Leonard Downing Date of Birth/Sex: Female) Other Clinician: Primary Care Physician: [place], JULIE Treating Evlyn Kanner Referring Physician: [place], JULIE Physician/Extender: Weeks in Treatment: 26 Visit Information History Since Last Visit All ordered tests and consults were completed: No Patient Arrived: Wheel Chair Added or deleted any medications: No Arrival Time: 10:51 Any new allergies or adverse reactions: No Transfer Assistance: Manual Had a fall or experienced change in No activities of daily living that may affect Patient Identification Verified: Yes risk of falls: Secondary Verification Process Yes Signs or symptoms of abuse/neglect since last No Completed: visito Patient Requires Transmission-Based No Has Dressing in Place as Prescribed: Yes Precautions: Pain Present Now: No Patient Has Alerts: No Electronic Signature(s) Signed: 07/29/2015 4:29:37 PM By: Lucrezia Starch, RN, Sendra Entered By: Lucrezia Starch RN, Sendra on 07/29/2015 10:51:41 [name] -------------------------------------------------------------------------------- Clinic Level of Care Assessment Details Patient Name: [name], [name] 07/29/2015 10:45 Date of Service: AM Medical Record [id number] Number: Patient Account Number: [id number] 1965/11/17 (49 y.o. Treating RN: Leonard Downing Date of Birth/Sex: Female) Other Clinician: Primary Care Physician: [place], JULIE Treating [name] Referring Physician: [place], JULIE Physician/Extender: Weeks in Treatment: 26 Clinic Level of Care Assessment Items TOOL 4 Quantity Score X - Use when only an EandM is performed on FOLLOW-UP visit 1 0 ASSESSMENTS - Nursing Assessment /  Reassessment X - Reassessment of Co-morbidities (includes updates in patient status) 1 10 X - Reassessment of Adherence to Treatment Plan 1 5 ASSESSMENTS - Wound and Skin Assessment / Reassessment  - Simple Wound Assessment / Reassessment - one wound 0 X - Complex Wound Assessment / Reassessment - multiple wounds 2 5  - Dermatologic / Skin Assessment (not related to wound area) 0 ASSESSMENTS - Focused Assessment  - Circumferential Edema Measurements - multi extremities 0  - Nutritional Assessment / Counseling / Intervention 0  - Lower Extremity Assessment (monofilament, tuning fork, pulses) 0  - Peripheral Arterial Disease Assessment (using hand held doppler) 0 ASSESSMENTS - Ostomy and/or Continence Assessment and Care  - Incontinence Assessment and Management 0  - Ostomy Care Assessment and Management (repouching, etc.) 0 PROCESS - Coordination of Care X - Simple Patient / Family Education for ongoing care 1 15  - Complex (extensive) Patient / Family Education for ongoing care 0 X - Staff obtains Chiropractor, Records, Test Results / Process Orders 1 10 X - Staff telephones HHA, Nursing Homes / Clarify orders / etc 1 10 [name], [name] ([id number])  - Routine Transfer to another Facility (non-emergent condition) 0  - Routine Hospital Admission (non-emergent condition) 0  - New Admissions / Manufacturing engineer / Ordering NPWT, Apligraf, etc. 0  - Emergency Hospital Admission (emergent condition) 0 X - Simple Discharge Coordination 1 10  - Complex (extensive) Discharge Coordination 0 PROCESS - Special Needs  - Pediatric / Minor Patient Management 0  - Isolation Patient Management 0  - Hearing / Language / Visual special needs 0  - Assessment of Community assistance (transportation, D/C planning, etc.) 0  - Additional assistance / Altered mentation 0  - Support Surface(s) Assessment (bed, cushion, seat, etc.) 0 INTERVENTIONS - Wound Cleansing  / Measurement  - Simple Wound Cleansing - one wound 0 X - Complex Wound Cleansing - multiple wounds 2 5 X - Wound Imaging (photographs - any number of  wounds) 1 5  - Wound Tracing (instead of photographs) 0  - Simple Wound Measurement - one wound 0 X - Complex Wound Measurement - multiple wounds 2 5 INTERVENTIONS - Wound Dressings X - Small Wound Dressing one or multiple wounds 2 10  - Medium Wound Dressing one or multiple wounds 0  - Large Wound Dressing one or multiple wounds 0  - Application of Medications - topical 0  - Application of Medications - injection 0 [name], [name] ([id number]) INTERVENTIONS - Miscellaneous  - External ear exam 0  - Specimen Collection (cultures, biopsies, blood, body fluids, etc.) 0  - Specimen(s) / Culture(s) sent or taken to Lab for analysis 0  - Patient Transfer (multiple staff / Michiel Sites Lift / Similar devices) 0  - Simple Staple / Suture removal (25 or less) 0  - Complex Staple / Suture removal (26 or more) 0  - Hypo / Hyperglycemic Management (close monitor of Blood Glucose) 0  - Ankle / Brachial Index (ABI) - do not check if billed separately 0 X - Vital Signs 1 5 Has the patient been seen at the hospital within the last three years: Yes Total Score: 120 Level Of Care: New/Established - Level 4 Electronic Signature(s) Signed: 07/29/2015 4:29:37 PM By: Lucrezia Starch, RN, Sendra Entered By: Lucrezia Starch, RN, Sendra on 07/29/2015 11:52:56 [name] ([id number]) -------------------------------------------------------------------------------- Complex / Palliative Patient Assessment Details Patient Name: [name], [name] 07/29/2015 10:45 Date of Service: AM Medical Record [id number] Number: Patient Account Number: [id number] 1965-10-08 (49 y.o. Treating RN: Leonard Downing Date of Birth/Sex: Female) Other Clinician: Primary Care Physician: [place], JULIE Treating Evlyn Kanner Referring Physician: [place],  JULIE Physician/Extender: Weeks in Treatment: 26 Palliative Management Criteria Complex Wound Management Criteria Patient has remarkable or complex co-morbidities requiring medications or treatments that extend wound healing times. Examples: o Diabetes mellitus with chronic renal failure or end stage renal disease requiring dialysis o Advanced or poorly controlled rheumatoid arthritis o Diabetes mellitus and end stage chronic obstructive pulmonary disease o Active cancer with current chemo- or radiation therapy paraplegic Care Approach Wound Care Plan: Complex Wound Management Electronic Signature(s) Signed: 07/29/2015 4:43:00 PM By: Maureen Chatters Signed: 07/29/2015 4:47:01 PM By: Evlyn Kanner MD, FACS Entered By: Lucrezia Starch RN, Sendra on 07/29/2015 16:42:46 Hutchins, Balinda [name] ([id number]) -------------------------------------------------------------------------------- Encounter Discharge Information Details Patient Name: [name], [name] 07/29/2015 10:45 Date of Service: AM Medical Record [id number] Number: Patient Account Number: [id number] [birth year] (49 y.o. Treating RN: Leonard Downing Date of Birth/Sex: Female) Other Clinician: Primary Care Physician: [place], JULIE Treating Evlyn Kanner Referring Physician: [place], JULIE Physician/Extender: Tania Ade in Treatment: 26 Encounter Discharge Information Items Facility Notification Discharge Pain Level: 0 Facility Type: Home Health Discharge Condition: Stable Orders Sent: Yes Ambulatory Status: Wheelchair Other (Note Discharge Destination: Required) Transportation: Private Auto Accompanied By: family Schedule Follow-up Appointment: Yes Medication Reconciliation completed and provided to Patient/Care Yes Oliviana Mcgahee: Provided on Clinical Summary of Care: 07/29/2015 Form Type Recipient Paper Patient CW Electronic Signature(s) Signed: 07/29/2015 4:29:37 PM By: Lucrezia Starch RN, Sendra Previous Signature:  07/29/2015 11:21:28 AM Version By: Gwenlyn Perking Entered By: Lucrezia Starch RN, Sendra on 07/29/2015 11:51:12 Highley, Balinda [name] ([id number]) -------------------------------------------------------------------------------- Lower Extremity Assessment Details Patient Name: [name], [name] 07/29/2015 10:45 Date of Service: AM Medical Record [id number] Number: Patient Account Number: [id number] 1966-06-23 (49 y.o. Treating RN: Leonard Downing Date of Birth/Sex: Female) Other Clinician: Primary Care Physician: [place], JULIE Treating Evlyn Kanner Referring Physician: [place], JULIE Physician/Extender: Tania Ade in Treatment: 26 Electronic Signature(s) Signed: 07/29/2015 4:29:37 PM By: Lucrezia Starch,  RN, Rosalio Macadamia Entered By: Lucrezia Starch, RN, Sendra on 07/29/2015 11:02:09 JEMIAH, CUADRA ([id number]) -------------------------------------------------------------------------------- Pain Assessment Details Patient Name: [name], [name] 07/29/2015 10:45 Date of Service: AM Medical Record [id number] Number: Patient Account Number: [id number] Nov 03, 1965 (49 y.o. Treating RN: Leonard Downing Date of Birth/Sex: Female) Other Clinician: Primary Care Physician: [place], JULIE Treating Evlyn Kanner Referring Physician: [place], JULIE Physician/Extender: Weeks in Treatment: 26 Active Problems Location of Pain Severity and Description of Pain Patient Has Paino Yes Site Locations Pain Location: Generalized Pain Pain Management and Medication Current Pain Management: Electronic Signature(s) Signed: 07/29/2015 4:29:37 PM By: Lucrezia Starch, RN, Sendra Entered By: Lucrezia Starch RN, Sendra on 07/29/2015 10:51:50 [name] ([id number]) -------------------------------------------------------------------------------- Patient/Caregiver Education Details Patient Name: [name], [name] 07/29/2015 10:45 Date of Service: AM Medical Record [id number] Number: Patient Account Number: [id number] 10/08/1965 (49 y.o.  Treating RN: Leonard Downing Date of Birth/Gender: Female) Other Clinician: Primary Care Physician: [place], JULIE Treating Evlyn Kanner Referring Physician: [place], JULIE Physician/Extender: Tania Ade in Treatment: 26 Education Assessment Education Provided To: Patient and Caregiver significant other Education Topics Provided Pressure: Pressure Ulcers: Care and Offloading, Pressure Ulcers: Care and Offloading 2, Preventing Handouts: Pressure Ulcers Methods: Explain/Verbal Responses: State content correctly Wound/Skin Impairment: Handouts: Caring for Your Ulcer, Skin Care Do's and Dont's Methods: Explain/Verbal Responses: State content correctly Electronic Signature(s) Signed: 07/29/2015 4:29:37 PM By: Lucrezia Starch, RN, Sendra Entered By: Lucrezia Starch RN, Sendra on 07/29/2015 11:05:31 Genter, Balinda [name] ([id number]) -------------------------------------------------------------------------------- Wound Assessment Details Patient Name: [name], [name] 07/29/2015 10:45 Date of Service: AM Medical Record [id number] Number: Patient Account Number: [id number] 12-Sep-1965 (49 y.o. 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 History: Pulmonary Disease (COPD), Clustered Wound: No Paraplegia Photos Wound Measurements Length: (cm) 1 % Reduction in Area Width: (cm) 0.5 % Reduction in Volu Depth: (cm) 0.5 Epithelialization: Area: (cm) 0.393 Tunneling: Volume: (cm) 0.196 Undermining: : 67.9% me: 80% None No No Wound Description Classification: Category/Stage IV Foul Odor After  Cl Wound Margin: Flat and Intact Due to Product Use Exudate Amount: Large Exudate Type: Serosanguineous Exudate Color: red, brown eansing: Yes : No Wound Bed Granulation Amount: Large (67-100%) Exposed Structure Granulation Quality: Red, Pink Fascia Exposed: No Necrotic Amount: Small (1-33%) Fat Layer Exposed: No Salameh, Akaiya M. ([id number]) Necrotic Quality: Adherent Slough Tendon Exposed: No Muscle Exposed: No Joint Exposed: No Bone Exposed: Yes Periwound Skin Texture Texture Color No Abnormalities Noted: No No Abnormalities Noted: No Callus: No Atrophie Blanche: No Crepitus: No Cyanosis: No Excoriation: No Ecchymosis: No Fluctuance: No Erythema: No Friable: No Hemosiderin Staining: No Induration: No Mottled: No Localized Edema: No Pallor: No Rash: No Rubor: No Scarring: No Temperature / Pain Moisture Temperature: No Abnormality No Abnormalities Noted: No Tenderness on Palpation: Yes Dry / Scaly: No Maceration: No Moist: No Wound Preparation Ulcer Cleansing: Rinsed/Irrigated with Saline Topical Anesthetic Applied: Other: lidocaine 4%, Treatment Notes Wound #1 (Right Scapula) 1. Cleansed with: Clean wound with Normal Saline 2. Anesthetic Topical Lidocaine 4% cream to wound bed prior to debridement 4. Dressing Applied: Other dressing (specify in notes) 5. Secondary Dressing Applied Bordered Foam Dressing Notes sorbact Electronic Signature(s) Signed: 07/29/2015 4:29:37 PM By: Lucrezia Starch, RN, Sendra Entered By: Lucrezia Starch RN, Sendra on 07/29/2015 12:26:35 [name] ([id number]) --------------------------------------------------------------------------------  Wound Assessment Details Patient Name: [name], [name] 07/29/2015 10:45 Date of Service: AM Medical Record [id number] Number: Patient Account Number: [id number] 1965-11-05 (50 y.o. 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 History: Pulmonary Disease (COPD), Clustered Wound: No Paraplegia Photos Wound Measurements Length: (cm) 0.5 Width: (cm) 0.5 Depth: (cm) 0.1 Area: (cm) 0.196 Volume: (cm) 0.02 % Reduction in Area: 74% % Reduction in Volume: 73.3% Epithelialization: Small (1-33%) Tunneling: No Undermining: No Wound Description Classification: Category/Stage IV Wound Margin: Distinct, outline attached Exudate Amount: Small Exudate Type: Serosanguineous Exudate Color: red, brown Foul Odor After Cleansing: No Wound Bed Granulation Amount: Large (67-100%) Exposed Structure Granulation Quality: Pink, Pale Fascia Exposed: No Necrotic Amount: None Present (0%) Fat Layer Exposed: No Furber, Jenasia M. ([id number]) Tendon Exposed: No Muscle Exposed: No Joint Exposed: No Bone Exposed: No Limited to Skin Breakdown Periwound Skin Texture Texture Color No Abnormalities Noted: No No Abnormalities Noted: No Callus: No Atrophie Blanche: No Crepitus: No Cyanosis: No Excoriation: No Ecchymosis: No Fluctuance: No Erythema: No Friable: No Hemosiderin Staining: No Induration: No Mottled: No Localized Edema: No Pallor: No Rash: No Rubor: No Scarring: No Temperature / Pain Moisture Tenderness on Palpation: Yes No Abnormalities Noted: No Dry / Scaly: No Maceration: Yes Moist: Yes Wound Preparation Ulcer Cleansing: Rinsed/Irrigated with Saline Topical Anesthetic Applied: Other: lidocaine 4%, Treatment Notes Wound #2 (Midline Sacrum) 1. Cleansed with: Clean wound with Normal Saline 2. Anesthetic Topical Lidocaine 4% cream to wound bed  prior to debridement 4. Dressing Applied: Aquacel Ag 5. Secondary Dressing Applied Bordered Foam Dressing Electronic Signature(s) Signed: 07/29/2015 4:29:37 PM By: Lucrezia Starch, RN, Sendra Entered By: Lucrezia Starch RN, Sendra on 07/29/2015 12:26:52 [name] ([id number]) -------------------------------------------------------------------------------- Vitals Details Patient Name: [name], [name] 07/29/2015 10:45 Date of Service: AM Medical Record [id number] Number: Patient Account Number: [id number] 27-Jul-1965 (49 y.o. Treating RN: Leonard Downing Date of Birth/Sex: Female) Other Clinician: Primary Care Physician: [place], JULIE Treating [name] Referring Physician: [place], JULIE Physician/Extender: Weeks in Treatment: 26 Vital Signs Time Taken: 11:20 Temperature (F): 97.9 Height (in): 63 Reference Range: 80 - 120 mg / dl Weight (lbs): 629 Body Mass Index (BMI): 17.7 Electronic Signature(s) Signed: 07/29/2015 4:29:37 PM By: Lucrezia Starch RN, Sendra Entered By: Lucrezia Starch RN, Sendra on 07/29/2015 11:52:08

## 2015-08-26 ENCOUNTER — Ambulatory Visit: Payer: Medicare Other | Admitting: Surgery

## 2015-09-09 ENCOUNTER — Encounter: Payer: Medicare Other | Attending: Surgery | Admitting: Surgery

## 2015-09-09 DIAGNOSIS — L89154 Pressure ulcer of sacral region, stage 4: Secondary | ICD-10-CM | POA: Diagnosis not present

## 2015-09-09 DIAGNOSIS — L89114 Pressure ulcer of right upper back, stage 4: Secondary | ICD-10-CM | POA: Insufficient documentation

## 2015-09-09 DIAGNOSIS — E440 Moderate protein-calorie malnutrition: Secondary | ICD-10-CM | POA: Insufficient documentation

## 2015-09-09 DIAGNOSIS — Z87891 Personal history of nicotine dependence: Secondary | ICD-10-CM | POA: Insufficient documentation

## 2015-09-09 DIAGNOSIS — G8252 Quadriplegia, C1-C4 incomplete: Secondary | ICD-10-CM | POA: Diagnosis not present

## 2015-09-10 NOTE — Progress Notes (Signed)
[name], [name] ([id number]) Visit Report for 09/09/2015 Arrival Information Details Patient Name: [name], [name] Date of Service: 09/09/2015 10:45 AM Medical Record Number: [id number] Patient Account Number: [id number] Date of Birth/Sex: [birth year] (50 y.o. Female) Treating RN: [name], [name] Primary Care Physician: [name] Other Clinician: Referring Physician: [place], [name] Treating Physician/Extender: [name] in Treatment: 32 Visit Information History Since Last Visit All ordered tests and consults were completed: No Patient Arrived: Wheel Chair Added or deleted any medications: No Arrival Time: 10:41 Any new allergies or adverse reactions: No Accompanied By: boyfriend Had a fall or experienced change in No activities of daily living that may affect Transfer Assistance: Other risk of falls: Patient Identification Verified: Yes Signs or symptoms of abuse/neglect since last No Secondary Verification Process Yes visito Completed: Hospitalized since last visit: No Patient Requires Transmission-Based No Pain Present Now: No Precautions: Patient Has Alerts: No Electronic Signature(s) Signed: 09/09/2015 5:40:39 PM By: [name] Entered By: [name] on 09/09/2015 10:42:11 [name] -------------------------------------------------------------------------------- Clinic Level of Care Assessment Details Patient Name: [name] Date of Service: 09/09/2015 10:45 AM Medical Record Number: [id number] Patient Account Number: [id number] Date of Birth/Sex: 04/02/1966 (50 y.o. Female) Treating RN: [name], [name] Primary Care Physician: [name] Other Clinician: Referring Physician: [place], [name] Treating Physician/Extender: [name] in Treatment: 32 Clinic Level of Care Assessment Items TOOL 4 Quantity Score  - Use when only an EandM is performed on FOLLOW-UP visit 0 ASSESSMENTS - Nursing Assessment /  Reassessment  - Reassessment of Co-morbidities (includes updates in patient status) 0 X - Reassessment of Adherence to Treatment Plan 1 5 ASSESSMENTS - Wound and Skin Assessment / Reassessment X - Simple Wound Assessment / Reassessment - one wound 1 5  - Complex Wound Assessment / Reassessment - multiple wounds 0  - Dermatologic / Skin Assessment (not related to wound area) 0 ASSESSMENTS - Focused Assessment  - Circumferential Edema Measurements - multi extremities 0  - Nutritional Assessment / Counseling / Intervention 0  - Lower Extremity Assessment (monofilament, tuning fork, pulses) 0  - Peripheral Arterial Disease Assessment (using hand held doppler) 0 ASSESSMENTS - Ostomy and/or Continence Assessment and Care X - Incontinence Assessment and Management 1 10  - Ostomy Care Assessment and Management (repouching, etc.) 0 PROCESS - Coordination of Care  - Simple Patient / Family Education for ongoing care 0 X - Complex (extensive) Patient / Family Education for ongoing care 1 20  - Staff obtains Chiropractor, Records, Test Results / Process Orders 0 X - Staff telephones HHA, Nursing Homes / Clarify orders / etc 1 10  - Routine Transfer to another Facility (non-emergent condition) 0 Nault, [name] ([id number])  - Routine Hospital Admission (non-emergent condition) 0  - New Admissions / Manufacturing engineer / Ordering NPWT, Apligraf, etc. 0  - Emergency Hospital Admission (emergent condition) 0 X - Simple Discharge Coordination 1 10  - Complex (extensive) Discharge Coordination 0 PROCESS - Special Needs  - Pediatric / Minor Patient Management 0  - Isolation Patient Management 0  - Hearing / Language / Visual special needs 0  - Assessment of Community assistance (transportation, D/C planning, etc.) 0  - Additional assistance / Altered mentation 0  - Support Surface(s) Assessment (bed, cushion, seat, etc.) 0 INTERVENTIONS - Wound Cleansing /  Measurement X - Simple Wound Cleansing - one wound 1 5  - Complex Wound Cleansing - multiple wounds 0 X - Wound Imaging (photographs - any number of wounds) 1 5  -  Wound Tracing (instead of photographs) 0 X - Simple Wound Measurement - one wound 1 5  - Complex Wound Measurement - multiple wounds 0 INTERVENTIONS - Wound Dressings  - Small Wound Dressing one or multiple wounds 0 X - Medium Wound Dressing one or multiple wounds 1 15  - Large Wound Dressing one or multiple wounds 0  - Application of Medications - topical 0  - Application of Medications - injection 0 INTERVENTIONS - Miscellaneous  - External ear exam 0 [name], [name] ([id number])  - Specimen Collection (cultures, biopsies, blood, body fluids, etc.) 0  - Specimen(s) / Culture(s) sent or taken to Lab for analysis 0  - Patient Transfer (multiple staff / Michiel Sites Lift / Similar devices) 0  - Simple Staple / Suture removal (25 or less) 0  - Complex Staple / Suture removal (26 or more) 0  - Hypo / Hyperglycemic Management (close monitor of Blood Glucose) 0  - Ankle / Brachial Index (ABI) - do not check if billed separately 0 X - Vital Signs 1 5 Has the patient been seen at the hospital within the last three years: Yes Total Score: 95 Level Of Care: New/Established - Level 3 Electronic Signature(s) Signed: 09/09/2015 5:40:39 PM By: [name] Entered By: [name] on 09/09/2015 15:18:49 [name], [name] ([id number]) -------------------------------------------------------------------------------- Complex / Palliative Patient Assessment Details Patient Name: [name], [name] Date of Service: 09/09/2015 10:45 AM Medical Record Number: [id number] Patient Account Number: [id number] Date of Birth/Sex: 1965/08/26 (50 y.o. Female) Treating RN: [name], [name] Primary Care Physician: [name] Other Clinician: Referring Physician: [place], [name] Treating Physician/Extender: [name] in Treatment: 32 Palliative Management Criteria Complex Wound Management Criteria The patient has limited personal or cognitive resources, or has no access to appropriate ongoing care providers, such that it is unreasonable to expect a level of compliance with prescribed advanced wound care treatments necessary to achieve desired healing outcomes. (Must be documented in physician progress notes.) Care Approach Wound Care Plan: Complex Wound Management Electronic Signature(s) Signed: 09/09/2015 5:40:39 PM By: [name] Signed: 09/10/2015 7:51:48 AM By: Evlyn Kanner MD, FACS Entered By: [name] on 09/09/2015 17:17:16 Salvucci, Balinda [name] ([id number]) -------------------------------------------------------------------------------- Encounter Discharge Information Details Patient Name: [name], [name] Date of Service: 09/09/2015 10:45 AM Medical Record Number: [id number] Patient Account Number: [id number] Date of Birth/Sex: 1966-01-05 (50 y.o. Female) Treating RN: [name], [name] Primary Care Physician: [name] Other Clinician: Referring Physician: [place], [name] Treating Physician/Extender: [name] in Treatment: 25 Encounter Discharge Information Items Discharge Pain Level: 0 Discharge Condition: Stable Ambulatory Status: Wheelchair Discharge Destination: Home Transportation: Private Auto Accompanied By: boyfriend Schedule Follow-up Appointment: Yes Medication Reconciliation completed Yes and provided to Patient/Care Aleksander Edmiston: Provided on Clinical Summary of Care: 09/09/2015 Form Type Recipient Paper Patient CW Electronic Signature(s) Signed: 09/09/2015 11:36:11 AM By: Francie Massing Entered By: Francie Massing on 09/09/2015 11:36:11 [place], Balinda [name] ([id number]) -------------------------------------------------------------------------------- Lower Extremity Assessment Details Patient Name: [name], [name] Date of Service: 09/09/2015 10:45  AM Medical Record Number: [id number] Patient Account Number: [id number] Date of Birth/Sex: [birth year] (50 y.o. Female) Treating RN: [name], [name] Primary Care Physician: [name] Other Clinician: Referring Physician: [place], [name] Treating Physician/Extender: [name] in Treatment: 32 Electronic Signature(s) Signed: 09/09/2015 5:40:39 PM By: [name] Entered By: [name] on 09/09/2015 11:06:58 [place], Millianna M. ([id number]) -------------------------------------------------------------------------------- Multi Wound Chart Details Patient Name: [name], [name] Date of Service: 09/09/2015 10:45 AM Medical Record Number: [id number] Patient Account Number: [id number] Date of Birth/Sex: July 19, 1965 (49  y.o. Female) Treating RN: [name], [name] Primary Care Physician: [name] Other Clinician: Referring Physician: [place], [name] Treating Physician/Extender: [name] in Treatment: 32 Vital Signs Height(in): 63 Pulse(bpm): 84 Weight(lbs): 100 Blood Pressure 95/62 (mmHg): Body Mass Index(BMI): 18 Temperature(F): 97.7 Respiratory Rate 20 (breaths/min): Photos: [1:No Photos] [2:No Photos] [N/A:N/A] Wound Location: [1:Right Scapula] [2:Midline Sacrum] [N/A:N/A] Wounding Event: [1:Pressure Injury] [2:Pressure Injury] [N/A:N/A] Primary Etiology: [1:Pressure Ulcer] [2:Pressure Ulcer] [N/A:N/A] Comorbid History: [1:Asthma, Chronic Obstructive Pulmonary Disease (COPD), Paraplegia] [2:N/A] [N/A:N/A] Date Acquired: [1:09/21/2014] [2:12/02/2007] [N/A:N/A] Weeks of Treatment: [1:32] [2:32] [N/A:N/A] Wound Status: [1:Open] [2:Open] [N/A:N/A] Measurements L x W x D 1x1.3x0.3 [2:0x0x0] [N/A:N/A] (cm) Area (cm) : [1:1.021] [2:0] [N/A:N/A] Volume (cm) : [1:0.306] [2:0] [N/A:N/A] % Reduction in Area: [1:16.70%] [2:100.00%] [N/A:N/A] % Reduction in Volume: 68.80% [2:100.00%] [N/A:N/A] Starting Position 1 10 (o'clock): Ending Position 1  [1:4] (o'clock): Maximum Distance 1 1.2 (cm): Undermining: [1:Yes] [2:N/A] [N/A:N/A] 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 ([id number]) Odor Anticipated Due to No N/A N/A Product Use: Wound Margin: Flat and Intact N/A N/A Granulation Amount: Large (67-100%) N/A N/A Granulation Quality: Red, Pink N/A N/A Necrotic Amount: Small (1-33%) N/A N/A Exposed Structures: Bone: Yes N/A N/A Fascia: No Fat: No Tendon: No Muscle: No Joint: No Epithelialization: None N/A N/A Periwound Skin Texture: Edema: No No Abnormalities Noted N/A Excoriation: No Induration: No Callus: No Crepitus: No Fluctuance: No Friable: No Rash: No Scarring: No Periwound Skin Moist: Yes No Abnormalities Noted N/A Moisture: Maceration: No Dry/Scaly: No Periwound Skin Color: Atrophie Blanche: No No Abnormalities Noted N/A Cyanosis: No Ecchymosis: No Erythema: No Hemosiderin Staining: No Mottled: No Pallor: No Rubor: No Temperature: No Abnormality N/A N/A Tenderness on Yes No N/A Palpation: Wound Preparation: Ulcer Cleansing: N/A N/A Rinsed/Irrigated with Saline Topical Anesthetic Applied: Other: lidocaine 4% Treatment Notes Electronic Signature(s) Signed: 09/09/2015 5:40:39 PM By: [name] Entered By: [name] on 09/09/2015 11:16:14 Diantonio, SRI CLEGG ([id number]) Grammatico, Balinda [name] ([id number]) -------------------------------------------------------------------------------- Multi-Disciplinary Care Plan Details Patient Name: [name], [name] Date of Service: 09/09/2015 10:45 AM Medical Record Number: [id number] Patient Account Number: [id number] Date of Birth/Sex: 1965-09-20 (50 y.o. Female) Treating RN: [name], [name] Primary Care  Physician: [name] Other Clinician: Referring Physician: [place], [name] Treating Physician/Extender: [name] in Treatment: 62 Active Inactive Abuse / Safety / Falls / Self Care Management Nursing Diagnoses: Impaired home maintenance Impaired physical mobility Potential for falls Self care deficit: actual or potential Goals: Patient/caregiver will verbalize understanding of skin care regimen Date Initiated: 01/27/2015 Goal Status: Active Patient/caregiver will verbalize/demonstrate measure taken to improve self care Date Initiated: 01/27/2015 Goal Status: Active Patient/caregiver will verbalize/demonstrate measures taken to improve the patient's personal safety Date Initiated: 01/27/2015 Goal Status: Active Patient/caregiver will verbalize/demonstrate measures taken to prevent injury and/or falls Date Initiated: 01/27/2015 Goal Status: Active Patient/caregiver will verbalize/demonstrate understanding of what to do in case of emergency Date Initiated: 01/27/2015 Goal Status: Active Interventions: Assess fall risk on admission and as needed Assess: immobility, friction, shearing, incontinence upon admission and as needed Assess impairment of mobility on admission and as needed per policy Assess self care needs on admission and as needed Provide education on basic hygiene Provide education on fall prevention Provide education on personal and home safety Provide education on safe transfers [name], [name] ([id number]) Treatment Activities: Education provided on Basic Hygiene : 02/04/2015 Notes: Orientation to  the Wound Care Program Nursing Diagnoses: Knowledge deficit related to the wound healing center program Goals: Patient/caregiver will verbalize understanding of the Wound Healing Center Program Date Initiated: 01/27/2015 Goal Status: Active Interventions: Provide education on orientation to the wound center Notes: Pressure Nursing Diagnoses: Knowledge  deficit related to causes and risk factors for pressure ulcer development Knowledge deficit related to management of pressures ulcers Potential for impaired tissue integrity related to pressure, friction, moisture, and shear Goals: Patient will remain free from development of additional pressure ulcers Date Initiated: 01/27/2015 Goal Status: Active Patient will remain free of pressure ulcers Date Initiated: 01/27/2015 Goal Status: Active Patient/caregiver will verbalize risk factors for pressure ulcer development Date Initiated: 01/27/2015 Goal Status: Active Patient/caregiver will verbalize understanding of pressure ulcer management Date Initiated: 01/27/2015 Goal Status: Active Interventions: Assess: immobility, friction, shearing, incontinence upon admission and as needed Assess offloading mechanisms upon admission and as needed Assess potential for pressure ulcer upon admission and as needed Provide education on pressure ulcers [name], [name] ([id number]) Treatment Activities: Patient referred for pressure reduction/relief devices : 09/09/2015 Patient referred for seating evaluation to ensure proper offloading : 09/09/2015 Notes: Wound/Skin Impairment Nursing Diagnoses: Impaired tissue integrity Knowledge deficit related to smoking impact on wound healing Knowledge deficit related to ulceration/compromised skin integrity Goals: Patient/caregiver will verbalize understanding of skin care regimen Date Initiated: 01/27/2015 Goal Status: Active Ulcer/skin breakdown will have a volume reduction of 30% by week 4 Date Initiated: 01/27/2015 Goal Status: Active Ulcer/skin breakdown will have a volume reduction of 50% by week 8 Date Initiated: 01/27/2015 Goal Status: Active Ulcer/skin breakdown will have a volume reduction of 80% by week 12 Date Initiated: 01/27/2015 Goal Status: Active Ulcer/skin breakdown will heal within 14 weeks Date Initiated: 01/27/2015 Goal Status:  Active Interventions: Assess ulceration(s) every visit Provide education on ulcer and skin care Notes: Electronic Signature(s) Signed: 09/09/2015 5:40:39 PM By: [name] Entered By: [name] on 09/09/2015 11:16:05 [name], [name] ([id number]) -------------------------------------------------------------------------------- Pain Assessment Details Patient Name: [name] Date of Service: 09/09/2015 10:45 AM Medical Record Number: [id number] Patient Account Number: [id number] Date of Birth/Sex: 04/21/1966 (50 y.o. Female) Treating RN: [name], [name] Primary Care Physician: [name] Other Clinician: Referring Physician: [place], [name] Treating Physician/Extender: [name] in Treatment: 59 Active Problems Location of Pain Severity and Description of Pain Patient Has Paino No Site Locations Pain Management and Medication Current Pain Management: Electronic Signature(s) Signed: 09/09/2015 5:40:39 PM By: [name] Entered By: [name] on 09/09/2015 10:42:18 Obrecht, Balinda [name] ([id number]) -------------------------------------------------------------------------------- Patient/Caregiver Education Details Patient Name: DEBBERA, WOLKEN. Date of Service: 09/09/2015 10:45 AM Medical Record Number: [id number] Patient Account Number: [id number] Date of Birth/Gender: August 06, 1965 (50 y.o. Female) Treating RN: [name], [name] Primary Care Physician: [name] Other Clinician: Referring Physician: [place], [name] Treating Physician/Extender: [name] in Treatment: 45 Education Assessment Education Provided To: Patient and Caregiver Education Topics Provided Wound/Skin Impairment: Handouts: Other: change dressing as ordered Methods: Demonstration, Explain/Verbal Responses: State content correctly Electronic Signature(s) Signed: 09/09/2015 5:40:39 PM By: [name] Entered By: [name] on 09/09/2015  11:33:14 Szabo, Nikiesha M. ([id number]) -------------------------------------------------------------------------------- Wound Assessment Details Patient Name: [name] Date of Service: 09/09/2015 10:45 AM Medical Record Number: [id number] Patient Account Number: [id number] Date of Birth/Sex: 1966/04/13 (50 y.o. Female) Treating RN: [name], [name] Primary Care Physician: [name] Other Clinician: Referring Physician: [place], [name] Treating Physician/Extender: [name] in Treatment: 32 Wound Status Wound Number: 1 Primary Pressure Ulcer Etiology: Wound Location: Right Scapula Wound Open  Wounding Event: Pressure Injury Status: Date Acquired: 09/21/2014 Comorbid Asthma, Chronic Obstructive Weeks Of Treatment: 32 History: Pulmonary Disease (COPD), Clustered Wound: No Paraplegia Photos Photo Uploaded By: [name] MullingPinkerton, Debra on 09/09/2015 16:00:32 Wound Measurements Length: (cm) 1 Width: (cm) 1.3 Depth: (cm) 0.3 Area: (cm) 1.021 Volume: (cm) 0.306 % Reduction in Area: 16.7% % Reduction in Volume: 68.8% Epithelialization: None Tunneling: No Undermining: Yes Starting Position (o'clock): 10 Ending Position (o'clock): 4 Maximum Distance: (cm) 1.2 Wound Description Classification: Category/Stage IV Wound Margin: Flat and Intact Exudate Amount: Large Exudate Type: Serosanguineous Exudate Color: red, brown Foul Odor After Cleansing: Yes Due to Product Use: No Wound Bed Christian, Jashiya M. ([id number]) Granulation Amount: Large (67-100%) Exposed Structure Granulation Quality: Red, Pink Fascia Exposed: No Necrotic Amount: Small (1-33%) Fat Layer Exposed: No Necrotic Quality: Adherent Slough Tendon Exposed: No Muscle Exposed: No Joint Exposed: No Bone Exposed: Yes Periwound Skin Texture Texture Color No Abnormalities Noted: No No Abnormalities Noted: No Callus: No Atrophie Blanche: No Crepitus: No Cyanosis: No Excoriation: No Ecchymosis:  No Fluctuance: No Erythema: No Friable: No Hemosiderin Staining: No Induration: No Mottled: No Localized Edema: No Pallor: No Rash: No Rubor: No Scarring: No Temperature / Pain Moisture Temperature: No Abnormality No Abnormalities Noted: No Tenderness on Palpation: Yes Dry / Scaly: No Maceration: No Moist: Yes Wound Preparation Ulcer Cleansing: Rinsed/Irrigated with Saline Topical Anesthetic Applied: Other: lidocaine 4%, Treatment Notes Wound #1 (Right Scapula) 1. Cleansed with: Clean wound with Normal Saline 2. Anesthetic Topical Lidocaine 4% cream to wound bed prior to debridement 3. Peri-wound Care: Skin Prep 4. Dressing Applied: Aquacel Ag 5. Secondary Dressing Applied Bordered Foam Dressing Electronic Signature(s) Signed: 09/09/2015 5:40:39 PM By: [name] MullingPinkerton, Debra Entered By: [name] MullingPinkerton, Debra on 09/09/2015 11:07:37 Puga, Balinda QuailsCHARLOTTE M. ([id number]) Wescoat, Charlann M. ([id number]) -------------------------------------------------------------------------------- Wound Assessment Details Patient Name: [name] MastersWADE, Annahi M. Date of Service: 09/09/2015 10:45 AM Medical Record Number: [id number] Patient Account Number: [id number] Date of Birth/Sex: 09-03-1965 (50 y.o. Female) Treating RN: [name] CordiaPinkerton, [name] Primary Care Physician: [name] TerritoryMONACO, [name] Other Clinician: Referring Physician: [place] Territory[place], [name] Treating Physician/Extender: [name] ReBritto, [name] Weeks in Treatment: 32 Wound Status Wound Number: 2 Primary Etiology: Pressure Ulcer Wound Location: Midline Sacrum Wound Status: Open Wounding Event: Pressure Injury Date Acquired: 12/02/2007 Weeks Of Treatment: 32 Clustered Wound: No Wound Measurements Length: (cm) 0 % Reduction Width: (cm) 0 % Reduction Depth: (cm) 0 Area: (cm) 0 Volume: (cm) 0 in Area: 100% in Volume: 100% Wound Description Classification: Category/Stage IV Periwound Skin Texture Texture Color No Abnormalities Noted: No No Abnormalities Noted:  No Moisture No Abnormalities Noted: No Electronic Signature(s) Signed: 09/09/2015 5:40:39 PM By: [name] MullingPinkerton, Debra Entered By: [name] MullingPinkerton, Debra on 09/09/2015 11:07:53 Petrella, Jourdin Judie PetitM. ([id number]) -------------------------------------------------------------------------------- Vitals Details Patient Name: [name] MastersWADE, Corita M. Date of Service: 09/09/2015 10:45 AM Medical Record Number: [id number] Patient Account Number: [id number] Date of Birth/Sex: 09-03-1965 (50 y.o. Female) Treating RN: [name] CordiaPinkerton, [name] Primary Care Physician: [name] TerritoryMONACO, [name] Other Clinician: Referring Physician: [place], [name] Treating Physician/Extender: [name] ReBritto, [name] Weeks in Treatment: 32 Vital Signs Time Taken: 10:42 Temperature (F): 97.7 Height (in): 63 Pulse (bpm): 84 Weight (lbs): 100 Respiratory Rate (breaths/min): 20 Body Mass Index (BMI): 17.7 Blood Pressure (mmHg): 95/62 Reference Range: 80 - 120 mg / dl Electronic Signature(s) Signed: 09/09/2015 5:40:39 PM By: [name] MullingPinkerton, Debra Entered By: [name] MullingPinkerton, Debra on 09/09/2015 10:44:22

## 2015-09-10 NOTE — Progress Notes (Signed)
REDINA, ZELLER ([id number]) Visit Report for 09/09/2015 Chief Complaint Document Details Patient Name: [name], [name] Date of Service: 09/09/2015 10:45 AM Medical Record Number: [id number] Patient Account Number: [id number] Date of Birth/Sex: 1966-02-21 (50 y.o. Female) Treating RN: Ashok Cordia, Debi Primary Care Physician: [place], JULIE Other Clinician: Referring Physician: [place], JULIE Treating Physician/Extender: Rudene Re in Treatment: 17 Information Obtained from: Patient Chief Complaint Patient presents to the wound care center for a consult due non healing wound. 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 She comes in for a pressure injury to the right scapular region and the sacral region. She has this for several months. Electronic Signature(s) Signed: 09/09/2015 11:45:45 AM By: Evlyn Kanner MD, FACS Entered By: Evlyn Kanner on 09/09/2015 11:45:45 [name], [name] -------------------------------------------------------------------------------- HPI Details Patient Name: [name], [name] Date of Service: 09/09/2015 10:45 AM Medical Record Number: [id number] Patient Account Number: [id number] Date of Birth/Sex: 09-12-1965 (50 y.o. 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 For about 4-5 months she's had a decubitus ulcer on the right scapular region and she has a couple of small areas which have opened out in the region of previous scar tissue in the sacral region. He does not have any other significant comorbidities. She has a good Roho cushion for  her wheelchair and she has a specialized mattress for sleep. She has stopped smoking a year ago. 02/11/2015 -- she has not yet got her appointment with the plastic surgeons at [facility]. They did a x-ray of the sacral region this afternoon just before coming here. 02/18/2015 -- X-ray of the pelvis -- IMPRESSION:No acute osseous injury of the pelvis. If there is clinical concern regarding osteomyelitis, recommend further evaluation with CT. X-ray of the sacrum and coccyx IMPRESSION:No acute osseous abnormality of the sacrum. If there is further clinical concern regarding osteomyelitis of the sacrum, further evaluation with CT is recommended. 03/11/2015 -- she has yet to obtain the appointment with the plastic surgeons at [facility]. She says she will be diligent and follow-up on this. 03/18/2015 -- she has an appointment to see the plastic surgeons at [facility] this coming Monday. Also has some issues with her air mattress and is working on getting this replaced. 03/25/2015 -- her air mattress is working okay now and she does not yet have a firm appointment to see her plastic surgeons. 07/08/2015 -- we have not yet received any appointment regarding her plastic surgery at [facility] and I don't believe the patient is being very compliant though she is very polite. 04/22/2015 -- he have not seen her for about a month and this was due to the fact that she had bronchitis, pneumothorax and was admitted to [facility]. We do not have a plastic surgery opinion as of yet. 12/22 2016 -- has been several months now and she has not been very compliant in getting an opinion with the plastic surgeons at [facility]. The appointments were canceled for various reasons and I have urged them to try and reschedule this as soon as possible. 09/09/2014 -- she has not seen [place] for about 6 weeks due to various issues including pneumonia, bronchitis and a surgical insertion  of a pain pump. He has also not yet  seen her plastic surgeon for an opinion. Electronic Signature(s) Signed: 09/09/2015 11:46:33 AM By: Evlyn Kanner MD, FACS Entered By: Evlyn Kanner on 09/09/2015 11:46:33 [name], [name] ([id number]) -------------------------------------------------------------------------------- Physical Exam Details Patient Name: [name], [name] Date of Service: 09/09/2015 10:45 AM Medical Record Number: [id number] Patient Account Number: [id number] Date of Birth/Sex: 1966/03/13 (50 y.o. Female) Treating RN: Ashok Cordia, Debi Primary Care Physician: [place], JULIE Other Clinician: Referring Physician: [place], JULIE Treating Physician/Extender: Rudene Re in Treatment: 32 Constitutional . Pulse regular. Respirations normal and unlabored. Afebrile. . Eyes Nonicteric. Reactive to light. Ears, Nose, Mouth, and Throat Lips, teeth, and gums WNL.Marland Kitchen Moist mucosa without lesions. Neck supple and nontender. No palpable supraclavicular or cervical adenopathy. Normal sized without goiter. Respiratory WNL. No retractions.. Cardiovascular Pedal Pulses WNL. No clubbing, cyanosis or edema. Lymphatic No adneopathy. No adenopathy. No adenopathy. Musculoskeletal Adexa without tenderness or enlargement.. Digits and nails w/o clubbing, cyanosis, infection, petechiae, ischemia, or inflammatory conditions.. Integumentary (Hair, Skin) No suspicious lesions. No crepitus or fluctuance. No peri-wound warmth or erythema. No masses.Marland Kitchen Psychiatric Judgement and insight Intact.. No evidence of depression, anxiety, or agitation.. Notes the sacral wound has healed out and there are no open areas. The right scapular area now has thinning of the skin just at the superior edge and there is another small opening. The undermining is significant between the 12 to 4:00 position and there is no slough. Electronic Signature(s) Signed: 09/09/2015 11:50:55 AM By: Evlyn Kanner MD, FACS Entered By: Evlyn Kanner on 09/09/2015  11:50:55 [name] ([id number]) -------------------------------------------------------------------------------- Physician Orders Details Patient Name: [name], [name] Date of Service: 09/09/2015 10:45 AM Medical Record Number: [id number] Patient Account Number: [id number] Date of Birth/Sex: 1965-12-26 (50 y.o. Female) Treating RN: Ashok Cordia, Debi Primary Care Physician: [place], JULIE Other Clinician: Referring Physician: [place], JULIE Treating Physician/Extender: Rudene Re in Treatment: 28 Verbal / Phone Orders: Yes Clinician: Ashok Cordia, Debi Read Back and Verified: Yes Diagnosis Coding Wound Cleansing Wound #1 Right Scapula o Clean wound with Normal Saline. o Cleanse wound with mild soap and water o May Shower, gently pat wound dry prior to applying new dressing. Anesthetic Wound #1 Right Scapula o Topical Lidocaine 4% cream applied to wound bed prior to debridement Primary Wound Dressing Wound #1 Right Scapula o Aquacel Ag - rope Secondary Dressing Wound #1 Right Scapula o Boardered Foam Dressing Dressing Change Frequency Wound #1 Right Scapula o Change dressing every other day. Follow-up Appointments Wound #1 Right Scapula o Return Appointment in 2 weeks. Off-Loading Wound #1 Right Scapula o Turn and reposition every 2 hours Additional Orders / Instructions Wound #1 Right Scapula o Increase protein intake. [name], [name] ([id number]) Home Health Wound #1 Right Scapula o Continue Home Health Visits - [facility] o Home Health Nurse may visit PRN to address patientos wound care needs. o FACE TO FACE ENCOUNTER: MEDICARE and MEDICAID PATIENTS: I certify that this patient is under my care and that I had a face-to-face encounter that meets the physician face-to-face encounter requirements with this patient on this date. The encounter with the patient was in whole or in part for the following MEDICAL CONDITION:  (primary reason for Home Healthcare) MEDICAL NECESSITY: I certify, that based on my findings, NURSING services are a medically necessary home health service. HOME BOUND STATUS: I certify that my clinical findings support that this patient is homebound (i.e., Due to illness or injury, pt requires aid of supportive devices such  as crutches, cane, wheelchairs, walkers, the use of special transportation or the assistance of another person to leave their place of residence. There is a normal inability to leave the home and doing so requires considerable and taxing effort. Other absences are for medical reasons / religious services and are infrequent or of short duration when for other reasons). o If current dressing causes regression in wound condition, may D/C ordered dressing product/s and apply Normal Saline Moist Dressing daily until next Wound Healing Center / Other MD appointment. Notify Wound Healing Center of regression in wound condition at [phone]. o Please direct any NON-WOUND related issues/requests for orders to patient's Primary Care Physician Medications-please add to medication list. Wound #1 Right Scapula o Other: - Zinc, Vitamin C, MVI Electronic Signature(s) Signed: 09/09/2015 4:46:58 PM By: Evlyn Kanner MD, FACS Signed: 09/09/2015 5:40:39 PM By: Alejandro Mulling Previous Signature: 09/09/2015 4:35:45 PM Version By: Evlyn Kanner MD, FACS Entered By: Alejandro Mulling on 09/09/2015 16:43:23 [name], [name] ([id number]) -------------------------------------------------------------------------------- Problem List Details Patient Name: ANDREYA, LACKS. Date of Service: 09/09/2015 10:45 AM Medical Record Number: [id number] Patient Account Number: [id number] Date of Birth/Sex: [birth year] (50 y.o. 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 Date of Service: 09/09/2015 10:45 AM Medical Record Number: [id number] Patient Account Number: [id number] Date of Birth/Sex: [birth year] (50 y.o. Female) Treating RN: Ashok Cordia, Debi Primary Care Physician: [place], JULIE Other Clinician: Referring Physician: [place], JULIE Treating Physician/Extender: Rudene Re in Treatment: 32 Subjective Chief Complaint Information obtained from Patient Patient presents to the wound care center for a consult due non healing wound. 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 She comes in for a pressure injury to the right scapular region and the sacral region. She has this for several months. 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 For about 4-5 months she's had a decubitus ulcer on the right scapular region and she has a couple of small areas which have opened out in the region of previous scar tissue in the sacral region. He does not have any other significant comorbidities. She has a good Roho cushion for her wheelchair and she has a specialized mattress for sleep. She has stopped smoking a  year ago. 02/11/2015 -- she has not yet got her appointment with the plastic surgeons at [facility]. They did a x-ray of the sacral region this afternoon just before coming here. 02/18/2015 -- X-ray of the pelvis -- IMPRESSION:No acute osseous injury of the pelvis. If there is clinical concern regarding osteomyelitis, recommend further evaluation with CT. X-ray of the sacrum and coccyx IMPRESSION:No acute osseous abnormality of the sacrum. If there is further clinical concern regarding osteomyelitis of the sacrum, further evaluation with CT is recommended. 03/11/2015 -- she has yet to obtain the appointment with the  [facility]. She says she will be diligent and follow-up on this. 03/18/2015 -- she has an appointment [facility]. Also has some issues with her air mattress and is working on getting this replaced. 03/25/2015 -- her air mattress is working okay now and she does not yet have a firm appointment to see her plastic surgeons. 07/08/2015 -- we have not yet received any appointment regarding her [facility] and I don't believe the patient is being very compliant though she is very polite. 04/22/2015 -- he have not seen her for about a month and this was due to the fact that she had bronchitis, pneumothorax and was [facility]. We do not have a plastic surgery opinion as of yet. 12/22 2016 -- has been several months now and she has not been very compliant in getting an opinion with the plastic surgeons [facility]. The appointments were canceled for various reasons and I have urged them to try and reschedule this as soon as possible. 09/09/2014 -- she has not seen us for about 6 weeks due to various issues including pneumonia, bronchitis and a surgical insertion of a pain pump. He has also not yet seen her plastic surgeon for an opinion. [name], [name]  ([id number]) Objective Constitutional Pulse regular. Respirations normal and unlabored. Afebrile. Vitals Time Taken: 10:42 AM, Height: 63 in, Weight: 100 lbs, BMI: 17.7, Temperature: 97.7 F, Pulse: 84 bpm, Respiratory Rate: 20 breaths/min, Blood Pressure: 95/62 mmHg. Eyes Nonicteric. Reactive to light. Ears, Nose, Mouth, and Throat Lips, teeth, and gums WNL.Marland Kitchen. Moist mucosa without lesions. Neck supple and nontender. No palpable supraclavicular or cervical adenopathy. Normal sized without goiter. Respiratory WNL. No retractions.. Cardiovascular Pedal Pulses WNL. No clubbing, cyanosis or edema. Lymphatic No adneopathy. No adenopathy. No adenopathy. Musculoskeletal Adexa without tenderness or enlargement.. Digits and nails w/o clubbing, cyanosis, infection, petechiae, ischemia, or inflammatory conditions.Marland Kitchen. Psychiatric Judgement and insight Intact.. No evidence of depression, anxiety, or agitation.. General Notes: the sacral wound has healed out and there are no open areas. The right scapular area now has thinning of the skin just at the superior edge and there is another small opening. The undermining is significant between the 12 to 4:00 position and there is no slough. Integumentary (Hair, Skin) No suspicious lesions. No crepitus or fluctuance. No peri-wound warmth or erythema. No masses.. Wound #1 status is Open. Original cause of wound was Pressure Injury. The wound is located on the Right Scapula. The wound measures 1cm length x 1.3cm width x 0.3cm depth; 1.021cm^2 area and 0.306cm^3 [name], [name] ([id number]) volume. There is bone exposed. There is no tunneling noted, however, there is undermining starting at 10:00 and ending at 4:00 with a maximum distance of 1.2cm. There is a large amount of serosanguineous drainage noted. The wound margin is flat and intact. There is large (67-100%) red, pink granulation within the wound bed. There is a small (1-33%) amount of necrotic  tissue within the wound bed including Adherent Slough. The periwound skin appearance exhibited: Moist. The periwound skin appearance did not exhibit: Callus, Crepitus, Excoriation, Fluctuance, Friable, Induration, Localized Edema, Rash, Scarring, Dry/Scaly, Maceration, Atrophie Blanche, Cyanosis, Ecchymosis, Hemosiderin Staining, Mottled, Pallor, Rubor, Erythema. Periwound temperature was noted as No Abnormality. The periwound has tenderness on palpation. Wound #2 status is Open. Original cause of wound was Pressure Injury. The wound is located on the Midline Sacrum. The wound measures 0cm length  x 0cm width x 0cm depth; 0cm^2 area and 0cm^3 volume. Assessment Active Problems ICD-10 L89.114 - Pressure ulcer of right upper back, stage 4 L89.154 - Pressure ulcer of sacral region, stage 4 G82.52 - Quadriplegia, C1-C4 incomplete E44.0 - Moderate protein-calorie malnutrition We will continue with Aquacel Ag to be packed into the area of her right shoulder and apply appropriate foam border. This pleasant 50 year old quadriplegic has had chronic problems which need to be addressed by a plastic surgeon but she has just not been able to get appointments appropriately and at times missed appointments because of illness or noncompliance. As a result we have not been able to make much headway and it's been 5 months since we've been asking her to see the plastic surgeons. She is under complex care and we will continue to support her to the best of our ability. Plan Wound Cleansing: Wound #1 Right Scapula: JYLA, HOPF M. ([id number]) Clean wound with Normal Saline. Cleanse wound with mild soap and water May Shower, gently pat wound dry prior to applying new dressing. Anesthetic: Wound #1 Right Scapula: Topical Lidocaine 4% cream applied to wound bed prior to debridement Primary Wound Dressing: Wound #1 Right Scapula: Aquacel Ag - rope Secondary Dressing: Wound #1 Right Scapula: Boardered  Foam Dressing Dressing Change Frequency: Wound #1 Right Scapula: Change dressing every other day. Follow-up Appointments: Wound #1 Right Scapula: Return Appointment in 2 weeks. Off-Loading: Wound #1 Right Scapula: Turn and reposition every 2 hours Additional Orders / Instructions: Wound #1 Right Scapula: Increase protein intake. Home Health: Wound #1 Right Scapula: Continue Home Health Visits - [facility] Home Health Nurse may visit PRN to address patient s wound care needs. FACE TO FACE ENCOUNTER: MEDICARE and MEDICAID PATIENTS: I certify that this patient is under my care and that I had a face-to-face encounter that meets the physician face-to-face encounter requirements with this patient on this date. The encounter with the patient was in whole or in part for the following MEDICAL CONDITION: (primary reason for Home Healthcare) MEDICAL NECESSITY: I certify, that based on my findings, NURSING services are a medically necessary home health service. HOME BOUND STATUS: I certify that my clinical findings support that this patient is homebound (i.e., Due to illness or injury, pt requires aid of supportive devices such as crutches, cane, wheelchairs, walkers, the use of special transportation or the assistance of another person to leave their place of residence. There is a normal inability to leave the home and doing so requires considerable and taxing effort. Other absences are for medical reasons / religious services and are infrequent or of short duration when for other reasons). If current dressing causes regression in wound condition, may D/C ordered dressing product/s and apply Normal Saline Moist Dressing daily until next Wound Healing Center / Other MD appointment. Notify Wound Healing Center of regression in wound condition at [phone]. Please direct any NON-WOUND related issues/requests for orders to patient's Primary Care Physician Medications-please add to  medication list.: Wound #1 Right Scapula: Other: - Zinc, Vitamin C, MVI [name], [name] ([id number]) We will continue with Aquacel Ag to be packed into the area of her right shoulder and apply appropriate foam border. This pleasant 51 year old quadriplegic has had chronic problems which need to be addressed by a plastic surgeon but she has just not been able to get appointments appropriately and at times missed appointments because of illness or noncompliance. As a result we have not been able to make much headway and it's  been 5 months since we've been asking her to see the plastic surgeons. She is under complex care and we will continue to support her to the best of our ability. Electronic Signature(s) Signed: 09/09/2015 4:46:45 PM By: Evlyn Kanner MD, FACS Previous Signature: 09/09/2015 11:52:51 AM Version By: Evlyn Kanner MD, FACS Entered By: Evlyn Kanner on 09/09/2015 16:46:45 [name], [name] ([id number]) -------------------------------------------------------------------------------- SuperBill Details Patient Name: [name], [name] Date of Service: 09/09/2015 Medical Record Number: [id number] Patient Account Number: [id number] Date of Birth/Sex: 20-Jun-1966 (50 y.o. Female) Treating RN: Ashok Cordia, Debi Primary Care Physician: [place], JULIE Other Clinician: Referring Physician: [place], JULIE Treating Physician/Extender: Rudene Re in Treatment: 32 Diagnosis Coding ICD-10 Codes Code Description L89.114 Pressure ulcer of right upper back, stage 4 L89.154 Pressure ulcer of sacral region, stage 4 G82.52 Quadriplegia, C1-C4 incomplete E44.0 Moderate protein-calorie malnutrition Facility Procedures CPT4 Code: [id number] Description: 99213 - WOUND CARE VISIT-LEV 3 EST PT Modifier: Quantity: 1 Physician Procedures CPT4 Code: [id number] Description: 99213 - WC PHYS LEVEL 3 - EST PT ICD-10 Description Diagnosis L89.114 Pressure ulcer of right upper back, stage 4 L89.154  Pressure ulcer of sacral region, stage 4 G82.52 Quadriplegia, C1-C4 incomplete E44.0 Moderate protein-calorie  malnutrition Modifier: Quantity: 1 Electronic Signature(s) Signed: 09/09/2015 4:35:45 PM By: Evlyn Kanner MD, FACS Signed: 09/09/2015 5:40:39 PM By: Alejandro Mulling Previous Signature: 09/09/2015 11:53:07 AM Version By: Evlyn Kanner MD, FACS Entered By: Alejandro Mulling on 09/09/2015 15:19:00

## 2015-09-23 ENCOUNTER — Ambulatory Visit: Payer: Medicare Other | Admitting: Surgery

## 2015-10-29 ENCOUNTER — Encounter: Payer: Medicare Other | Attending: Surgery | Admitting: Surgery

## 2015-10-29 DIAGNOSIS — G8252 Quadriplegia, C1-C4 incomplete: Secondary | ICD-10-CM | POA: Insufficient documentation

## 2015-10-29 DIAGNOSIS — L89154 Pressure ulcer of sacral region, stage 4: Secondary | ICD-10-CM | POA: Diagnosis not present

## 2015-10-29 DIAGNOSIS — L89114 Pressure ulcer of right upper back, stage 4: Secondary | ICD-10-CM | POA: Diagnosis present

## 2015-10-29 DIAGNOSIS — E440 Moderate protein-calorie malnutrition: Secondary | ICD-10-CM | POA: Diagnosis not present

## 2015-10-29 DIAGNOSIS — Z87891 Personal history of nicotine dependence: Secondary | ICD-10-CM | POA: Diagnosis not present

## 2015-10-29 NOTE — Progress Notes (Addendum)
[name], [name] ([id number]) Visit Report for 10/29/2015 Chief Complaint Document Details Patient Name: [name], [name] Date of Service: 10/29/2015 1:30 PM Medical Record Number: [id number] Patient Account Number: [id number] Date of Birth/Sex: [birth year] (50 y.o. Female) Treating RN: Ashok Cordia, Debi Primary Care Physician: [place], JULIE Other Clinician: Referring Physician: [place], JULIE Treating Physician/Extender: Rudene Re in Treatment: 55 Information Obtained from: Patient Chief Complaint Patient presents to the wound care center for a consult due non healing wound. 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 She comes in for a pressure injury to the right scapular region and the sacral region. She has this for several months. Electronic Signature(s) Signed: 10/29/2015 1:43:13 PM By: Evlyn Kanner MD, FACS Entered By: Evlyn Kanner on 10/29/2015 13:43:13 Sleep, [name] ([id number]) -------------------------------------------------------------------------------- HPI Details Patient Name: [name], [name]. Date of Service: 10/29/2015 1:30 PM Medical Record Number: [id number] Patient Account Number: [id number] Date of Birth/Sex: 23-Feb-1966 (50 y.o. 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. For about 4-5 months she's had a decubitus ulcer on the right scapular region and she has a couple of small areas which have opened out in the region of previous scar tissue in the sacral region. He does not have any other significant comorbidities. She has a good Roho cushion for  her wheelchair and she has a specialized mattress for sleep. She has stopped smoking a year ago. 02/11/2015 -- she has not yet got her appointment with the plastic surgeons at [facility]. They did a x-ray of the sacral region this afternoon just before coming here. 02/18/2015 -- X-ray of the pelvis -- IMPRESSION:No acute osseous injury of the pelvis. If there is clinical concern regarding osteomyelitis, recommend further evaluation with CT. X-ray of the sacrum and coccyx IMPRESSION:No acute osseous abnormality of the sacrum. If there is further clinical concern regarding osteomyelitis of the sacrum, further evaluation with CT is recommended. 03/11/2015 -- she has yet to obtain the appointment with the plastic surgeons at [facility]. She says she will be diligent and follow-up on this. 03/18/2015 -- she has an appointment to see the plastic surgeons at [facility] this coming Monday. Also has some issues with her air mattress and is working on getting this replaced. 03/25/2015 -- her air mattress is working okay now and she does not yet have a firm appointment to see her plastic surgeons. 04/22/2015 -- he have not seen her for about a month and this was due to the fact that she had bronchitis, pneumothorax and was admitted to [facility]. We do not have a plastic surgery opinion as of yet. 12/22 2016 -- has been several months now and she has not been very compliant in getting an opinion with the plastic surgeons at [facility]. The appointments were canceled for various reasons and I have urged them to try and reschedule this as soon as possible. 07/08/2015 -- we have not yet received any appointment regarding her plastic surgery at [facility] and I don't believe the patient is being very compliant though she is very polite. 09/09/2015 -- she has not seen [place] for about 6 weeks due to various issues including pneumonia, bronchitis and a surgical insertion  of a pain pump. He has also not yet  seen her plastic surgeon for an opinion. 10/29/2015 -- it has been about 7 weeks since she has come to see as and has not yet seen her plastic surgeons.she continues to have several excuses for not keeping her appointments. CHERELLE, MIDKIFF ([id number]) Electronic Signature(s) Signed: 10/29/2015 1:44:51 PM By: Evlyn Kanner MD, FACS Entered By: Evlyn Kanner on 10/29/2015 13:44:50 Laux, [name] ([id number]) -------------------------------------------------------------------------------- Physical Exam Details Patient Name: [name], [name]. Date of Service: 10/29/2015 1:30 PM Medical Record Number: [id number] Patient Account Number: [id number] Date of Birth/Sex: [birth year] (50 y.o. Female) Treating RN: Ashok Cordia, Debi Primary Care Physician: [place], JULIE Other Clinician: Referring Physician: [place], JULIE Treating Physician/Extender: Rudene Re in Treatment: 39 Constitutional . Pulse regular. Respirations normal and unlabored. Afebrile. . Eyes Nonicteric. Reactive to light. Ears, Nose, Mouth, and Throat Lips, teeth, and gums WNL.Marland Kitchen Moist mucosa without lesions. Neck supple and nontender. No palpable supraclavicular or cervical adenopathy. Normal sized without goiter. Respiratory WNL. No retractions.. Breath sounds WNL, No rubs, rales, rhonchi, or wheeze.. Cardiovascular Heart rhythm and rate regular, no murmur or gallop.. Pedal Pulses WNL. No clubbing, cyanosis or edema. Chest Breasts symmetical and no nipple discharge.. Breast tissue WNL, no masses, lumps, or tenderness.. Lymphatic No adneopathy. No adenopathy. No adenopathy. Musculoskeletal Adexa without tenderness or enlargement.. Digits and nails w/o clubbing, cyanosis, infection, petechiae, ischemia, or inflammatory conditions.. Integumentary (Hair, Skin) No suspicious lesions. No crepitus or fluctuance. No peri-wound warmth or erythema. No masses.Marland Kitchen Psychiatric Judgement and insight Intact.. No evidence of  depression, anxiety, or agitation.. Notes the right scapular wound has less undermining and seems to have got some granulation tissue. There is no evidence of cellulitis or slough. Electronic Signature(s) Signed: 10/29/2015 1:45:29 PM By: Evlyn Kanner MD, FACS Entered By: Evlyn Kanner on 10/29/2015 13:45:28 Foucher, [name] ([id number]) -------------------------------------------------------------------------------- Physician Orders Details Patient Name: ADLEIGH, MCMASTERS. Date of Service: 10/29/2015 1:30 PM Medical Record Number: [id number] Patient Account Number: [id number] Date of Birth/Sex: Nov 07, 1965 (50 y.o. 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 o Cleanse wound with mild soap and water o May Shower, gently pat wound dry prior to applying new dressing. Anesthetic Wound #1 Right Scapula o Topical Lidocaine 4% cream applied to wound bed prior to debridement Primary Wound Dressing Wound #1 Right Scapula o Aquacel Ag - rope Secondary Dressing Wound #1 Right Scapula o Boardered Foam Dressing Dressing Change Frequency Wound #1 Right Scapula o Change dressing every other day. Follow-up Appointments Wound #1 Right Scapula o Return Appointment in 2 weeks. Off-Loading KORYNNE, DOLS. ([id number]) Wound #1 Right Scapula o Turn and reposition every 2 hours Additional Orders / Instructions Wound #1 Right Scapula o Increase  protein intake. Home Health Wound #1 Right Scapula o Continue Home Health Visits - [facility] o Home Health Nurse may visit PRN to address patientos wound care needs. o FACE TO FACE ENCOUNTER: MEDICARE and MEDICAID PATIENTS: I certify that this patient is under my care and that I had a face-to-face encounter that meets the physician face-to-face encounter  requirements with this patient on this date. The encounter with the patient was in whole or in part for the following MEDICAL CONDITION: (primary reason for Home Healthcare) MEDICAL NECESSITY: I certify, that based on my findings, NURSING services are a medically necessary home health service. HOME BOUND STATUS: I certify that my clinical findings support that this patient is homebound (i.e., Due to illness or injury, pt requires aid of supportive devices such as crutches, cane, wheelchairs, walkers, the use of special transportation or the assistance of another person to leave their place of residence. There is a normal inability to leave the home and doing so requires considerable and taxing effort. Other absences are for medical reasons / religious services and are infrequent or of short duration when for other reasons). o If current dressing causes regression in wound condition, may D/C ordered dressing product/s and apply Normal Saline Moist Dressing daily until next Wound Healing Center / Other MD appointment. Notify Wound Healing Center of regression in wound condition at [phone]. o Please direct any NON-WOUND related issues/requests for orders to patient's Primary Care Physician Medications-please add to medication list. Wound #1 Right Scapula o Other: - Zinc, Vitamin C, MVI Notes Patient to follow-up on surgical appointment. Electronic Signature(s) Signed: 10/29/2015 4:23:46 PM By: Evlyn Kanner MD, FACS Signed: 10/29/2015 4:53:26 PM By: Elliot Gurney RN, BSN, Kim RN, BSN Entered By: Elliot Gurney, RN, BSN, Kim on  10/29/2015 13:54:22 Helseth, [name] ([id number]) -------------------------------------------------------------------------------- Problem List Details Patient Name: MAKENLEE, MCKEAG. Date of Service: 10/29/2015 1:30 PM Medical Record Number: [id number] Patient Account Number: [id number] Date of Birth/Sex: Dec 22, 1965 (50 y.o. 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 Date of Service: 10/29/2015 1:30 PM Medical Record Number: [id number] Patient Account Number: [id number] Date of Birth/Sex: 1966-06-09 (50 y.o. Female) Treating RN: Ashok Cordia, Debi Primary Care Physician: [place], JULIE Other Clinician: Referring Physician: [place], JULIE Treating Physician/Extender: Rudene Re in Treatment: 5 Subjective Chief Complaint Information obtained from Patient Patient presents to the wound care center for a consult due non healing wound. 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 She comes in for a pressure injury to the right scapular region and the sacral region. She has this for several months. History of Present Illness (HPI) 50 year old  patient was had quadriparesis for the last 7 years and has had previous extensive surgery and reconstruction at [facility]. For about 4-5 months she's had a decubitus ulcer on the right scapular region and she has a couple of small areas which have opened out in the region of previous scar tissue in the sacral region. He does not have any other significant comorbidities. She has a good Roho cushion for her wheelchair and she has a specialized mattress for sleep. She has stopped smoking a year ago. 02/11/2015 -- she has not yet got her appointment with the plastic surgeons at [facility]. They did a x-ray of  the sacral region this afternoon just before coming here. 02/18/2015 -- X-ray of the pelvis -- IMPRESSION:No acute osseous injury of the pelvis. If there is clinical concern regarding osteomyelitis, recommend further evaluation with CT. X-ray of the sacrum and coccyx IMPRESSION:No acute osseous abnormality of the sacrum. If there is further clinical concern regarding osteomyelitis of the sacrum, further evaluation with CT is recommended. 03/11/2015 -- she has yet to obtain the [facility]. She says she will be diligent and follow-up on this. 03/18/2015 -- she has an appointment to [facility] coming [facility]. Also has some issues with her air mattress and is working on getting this replaced. 03/25/2015 -- her air mattress is working okay now and she does not yet have a firm appointment to see her plastic surgeons. 04/22/2015 -- he have not seen her for about a month and this was due to the fact that she had bronchitis, pneumothorax and was [facility]. We do not have a plastic surgery opinion as of yet. 12/22 2016 -- has been several months now and she has not been very compliant in getting an opinion with the plastic surgeons [facility]. The appointments were canceled for various reasons  and I have urged them to try and reschedule this as soon as possible. 07/08/2015 -- we have not yet received any appointment regarding her plastic [facility] and I don't believe the patient is being very compliant though she is very polite. [name], Tyrea M. ([id number]) 09/09/2015 -- she has not seen us for about 6 weeks due to various issues including pneumonia, bronchitis and a surgical insertion of a pain pump. He has also not yet seen her plastic surgeon for an opinion. 10/29/2015 -- it has been about 7 weeks since she has come to see as and has not yet seen her plastic surgeons.she continues to have several excuses for not keeping her appointments. Objective Constitutional Pulse regular. Respirations normal and unlabored. Afebrile. Vitals Time Taken: 1:25 PM, Height: 63 in, Weight: 100 lbs, BMI: 17.7, Temperature: 98.4 F, Pulse: 97 bpm, Respiratory Rate: 18 breaths/min, Blood Pressure: 96/65 mmHg. Eyes Nonicteric. Reactive to light. Ears, Nose, Mouth, and Throat Lips, teeth, and gums WNL.Marland Kitchen. Moist mucosa without lesions. Neck supple and nontender. No palpable supraclavicular or cervical adenopathy. Normal sized without goiter. Respiratory WNL. No retractions.. Breath sounds WNL, No rubs, rales, rhonchi, or wheeze.. Cardiovascular Heart rhythm and rate regular, no murmur or gallop.. Pedal Pulses WNL. No clubbing, cyanosis or edema. Chest Breasts symmetical and no nipple discharge.. Breast tissue WNL, no masses, lumps, or tenderness.. Lymphatic No adneopathy. No adenopathy. No adenopathy. Musculoskeletal Adexa without tenderness or enlargement.. Digits and nails w/o clubbing, cyanosis, infection, petechiae, ischemia, or inflammatory conditions.Marland Kitchen. Psychiatric Judgement and insight Intact.. No evidence of depression, anxiety, or agitation.Thurmond Butts. Dezeeuw, [name] ([id number]) General Notes: the right scapular wound has less undermining and seems to have got some  granulation tissue. There is no evidence of cellulitis or slough. Integumentary (Hair, Skin) No suspicious lesions. No crepitus or fluctuance. No peri-wound warmth or erythema. No masses.. Wound #1 status is Open. Original cause of wound was Pressure Injury. The wound is located on the Right Scapula. The wound measures 1cm length x 1cm width x 0.3cm depth; 0.785cm^2 area and 0.236cm^3 volume. There is bone exposed. There is undermining starting at 12:00 and ending at 6:00 with a maximum distance of 1cm. There is a large amount  of serosanguineous drainage noted. The wound margin is flat and intact. There is large (67-100%) red, pink granulation within the wound bed. There is a small (1-33%) amount of necrotic tissue within the wound bed including Adherent Slough. The periwound skin appearance exhibited: Moist. The periwound skin appearance did not exhibit: Callus, Crepitus, Excoriation, Fluctuance, Friable, Induration, Localized Edema, Rash, Scarring, Dry/Scaly, Maceration, Atrophie Blanche, Cyanosis, Ecchymosis, Hemosiderin Staining, Mottled, Pallor, Rubor, Erythema. Periwound temperature was noted as No Abnormality. The periwound has tenderness on palpation. Assessment Active Problems ICD-10 L89.114 - Pressure ulcer of right upper back, stage 4 L89.154 - Pressure ulcer of sacral region, stage 4 G82.52 - Quadriplegia, C1-C4 incomplete E44.0 - Moderate protein-calorie malnutrition Plan Wound Cleansing: Wound #1 Right Scapula: Clean wound with Normal Saline. Cleanse wound with mild soap and water May Shower, gently pat wound dry prior to applying new dressing. Anesthetic: Wound #1 Right Scapula: Topical Lidocaine 4% cream applied to wound bed prior to debridement Primary Wound Dressing: Wound #1 Right Scapula: Aquacel Ag - rope Secondary Dressing: Wound #1 Right Scapula: Ambers, Ariel M. ([id number]) Boardered Foam Dressing Dressing Change Frequency: Wound #1 Right  Scapula: Change dressing every other day. Follow-up Appointments: Wound #1 Right Scapula: Return Appointment in 2 weeks. Off-Loading: Wound #1 Right Scapula: Turn and reposition every 2 hours Additional Orders / Instructions: Wound #1 Right Scapula: Increase protein intake. Home Health: Wound #1 Right Scapula: Continue Home Health Visits - [facility] Home Health Nurse may visit PRN to address patient s wound care needs. FACE TO FACE ENCOUNTER: MEDICARE and MEDICAID PATIENTS: I certify that this patient is under my care and that I had a face-to-face encounter that meets the physician face-to-face encounter requirements with this patient on this date. The encounter with the patient was in whole or in part for the following MEDICAL CONDITION: (primary reason for Home Healthcare) MEDICAL NECESSITY: I certify, that based on my findings, NURSING services are a medically necessary home health service. HOME BOUND STATUS: I certify that my clinical findings support that this patient is homebound (i.e., Due to illness or injury, pt requires aid of supportive devices such as crutches, cane, wheelchairs, walkers, the use of special transportation or the assistance of another person to leave their place of residence. There is a normal inability to leave the home and doing so requires considerable and taxing effort. Other absences are for medical reasons / religious services and are infrequent or of short duration when for other reasons). If current dressing causes regression in wound condition, may D/C ordered dressing product/s and apply Normal Saline Moist Dressing daily until next Wound Healing Center / Other MD appointment. Notify Wound Healing Center of regression in wound condition at [phone]. Please direct any NON-WOUND related issues/requests for orders to patient's Primary Care Physician Medications-please add to medication list.: Wound #1 Right Scapula: Other: - Zinc,  Vitamin C, MVI General Notes: Patient to follow-up on surgical appointment. This pleasant 50 year old quadriplegic has had chronic problems which need to be addressed by a plastic surgeon but she has just not been able to get appointments appropriately and at times missed appointments because of illness or noncompliance. We will continue with Aquacel Ag to be packed into the area of her right shoulder and apply appropriate foam border. BEMNET, TROVATO. ([id number]) She is under complex care and we will continue to support her to the best of our ability. Electronic Signature(s) Signed: 10/29/2015 4:25:33 PM By: Evlyn Kanner MD, FACS Previous Signature: 10/29/2015 1:46:41 PM Version  By: Evlyn Kanner MD, FACS Entered By: Evlyn Kanner on 10/29/2015 16:25:33 Sherwood, [name] ([id number]) -------------------------------------------------------------------------------- SuperBill Details Patient Name: RUDEAN, ICENHOUR. Date of Service: 10/29/2015 Medical Record Number: [id number] Patient Account Number: [id number] Date of Birth/Sex: 02-08-1966 (50 y.o. 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

## 2015-10-30 NOTE — Progress Notes (Signed)
ROGENA, DEUPREE ([id number]) Visit Report for 10/29/2015 Arrival Information Details Patient Name: [name], [name] Date of Service: 10/29/2015 1:30 PM Medical Record Number: [id number] Patient Account Number: [id number] Date of Birth/Sex: [birth year] (50 y.o. Female) Treating RN: Huel Coventry Primary Care Physician: [place], JULIE Other Clinician: Referring Physician: [place], JULIE Treating Physician/Extender: Rudene Re in Treatment: 57 Visit Information History Since Last Visit Had a fall or experienced change in No Patient Arrived: Wheel activities of daily living that may affect Chair risk of falls: Arrival Time: 13:20 Hospitalized since last visit: No Accompanied By: friend Has Dressing in Place as Prescribed: Yes Transfer Assistance: Manual Pain Present Now: No Patient Identification Verified: Yes Secondary Verification Process Yes Completed: Patient Requires Transmission-Based No Precautions: Patient Has Alerts: No Electronic Signature(s) Signed: 10/29/2015 4:53:26 PM By: Elliot Gurney, RN, BSN, Kim RN, BSN Entered By: Elliot Gurney, RN, BSN, Kim on 10/29/2015 13:21:14 Xie, [name] ([id number]) -------------------------------------------------------------------------------- Clinic Level of Care Assessment Details Patient Name: [name], [name] Date of Service: 10/29/2015 1:30 PM Medical Record Number: [id number] Patient Account Number: [id number] Date of Birth/Sex: 1965/11/03 (50 y.o. Female) Treating RN: Huel Coventry Primary Care Physician: [place], JULIE Other Clinician: Referring Physician: [place], JULIE Treating Physician/Extender: Rudene Re in Treatment: 34 Clinic Level of Care Assessment Items TOOL 4 Quantity Score []  - Use when only an EandM is performed on FOLLOW-UP visit 0 ASSESSMENTS - Nursing Assessment / Reassessment X - Reassessment of Co-morbidities (includes updates in patient status) 1 10 []  - Reassessment of Adherence to Treatment Plan  0 ASSESSMENTS - Wound and Skin Assessment / Reassessment X - Simple Wound Assessment / Reassessment - one wound 1 5 []  - Complex Wound Assessment / Reassessment - multiple wounds 0 []  - Dermatologic / Skin Assessment (not related to wound area) 0 ASSESSMENTS - Focused Assessment []  - Circumferential Edema Measurements - multi extremities 0 []  - Nutritional Assessment / Counseling / Intervention 0 []  - Lower Extremity Assessment (monofilament, tuning fork, pulses) 0 []  - Peripheral Arterial Disease Assessment (using hand held doppler) 0 ASSESSMENTS - Ostomy and/or Continence Assessment and Care []  - Incontinence Assessment and Management 0 []  - Ostomy Care Assessment and Management (repouching, etc.) 0 PROCESS - Coordination of Care X - Simple Patient / Family Education for ongoing care 1 15 []  - Complex (extensive) Patient / Family Education for ongoing care 0 X - Staff obtains Chiropractor, Records, Test Results / Process Orders 1 10 []  - Staff telephones HHA, Nursing Homes / Clarify orders / etc 0 []  - Routine Transfer to another Facility (non-emergent condition) 0 Schirmer, Sarenity M. ([id number]) []  - Routine Hospital Admission (non-emergent condition) 0 []  - New Admissions / Manufacturing engineer / Ordering NPWT, Apligraf, etc. 0 []  - Emergency Hospital Admission (emergent condition) 0 X - Simple Discharge Coordination 1 10 []  - Complex (extensive) Discharge Coordination 0 PROCESS - Special Needs []  - Pediatric / Minor Patient Management 0 []  - Isolation Patient Management 0 []  - Hearing / Language / Visual special needs 0 []  - Assessment of Community assistance (transportation, D/C planning, etc.) 0 []  - Additional assistance / Altered mentation 0 []  - Support Surface(s) Assessment (bed, cushion, seat, etc.) 0 INTERVENTIONS - Wound Cleansing / Measurement X - Simple Wound Cleansing - one wound 1 5 []  - Complex Wound Cleansing - multiple wounds 0 X - Wound Imaging (photographs -  any number of wounds) 1 5 []  - Wound Tracing (instead of photographs) 0 X - Simple Wound Measurement - one wound 1 5 []  -  Complex Wound Measurement - multiple wounds 0 INTERVENTIONS - Wound Dressings X - Small Wound Dressing one or multiple wounds 1 10 []  - Medium Wound Dressing one or multiple wounds 0 []  - Large Wound Dressing one or multiple wounds 0 []  - Application of Medications - topical 0 []  - Application of Medications - injection 0 INTERVENTIONS - Miscellaneous []  - External ear exam 0 Hashman, Janee M. ([id number]) []  - Specimen Collection (cultures, biopsies, blood, body fluids, etc.) 0 []  - Specimen(s) / Culture(s) sent or taken to Lab for analysis 0 []  - Patient Transfer (multiple staff / Michiel SitesHoyer Lift / Similar devices) 0 []  - Simple Staple / Suture removal (25 or less) 0 []  - Complex Staple / Suture removal (26 or more) 0 []  - Hypo / Hyperglycemic Management (close monitor of Blood Glucose) 0 []  - Ankle / Brachial Index (ABI) - do not check if billed separately 0 X - Vital Signs 1 5 Has the patient been seen at the hospital within the last three years: Yes Total Score: 80 Level Of Care: New/Established - Level 3 Electronic Signature(s) Signed: 10/29/2015 4:53:26 PM By: Elliot GurneyWoody, RN, BSN, Kim RN, BSN Entered By: Elliot GurneyWoody, RN, BSN, Kim on 10/29/2015 13:46:23 Calloway, [name] ([id number]) -------------------------------------------------------------------------------- Encounter Discharge Information Details Patient Name: [name], [name] Date of Service: 10/29/2015 1:30 PM Medical Record Number: [id number] Patient Account Number: [id number] Date of Birth/Sex: [birth year] (50 y.o. Female) Treating RN: Huel CoventryWoody, Kim Primary Care Physician: [place], JULIE Other Clinician: Referring Physician: [place], JULIE Treating Physician/Extender: Rudene ReBritto, [name] Weeks in Treatment: 6639 Encounter Discharge Information Items Discharge Pain Level: 0 Discharge Condition: Stable Ambulatory Status:  Wheelchair Discharge Destination: Home Transportation: Private Auto Accompanied By: Rosalia Hammersay Schedule Follow-up Appointment: Yes Medication Reconciliation completed and provided to Patient/Care Yes Kenecia Barren: Provided on Clinical Summary of Care: 10/29/2015 Form Type Recipient Paper Patient CW Electronic Signature(s) Signed: 10/29/2015 4:53:26 PM By: Elliot GurneyWoody, RN, BSN, Kim RN, BSN Previous Signature: 10/29/2015 1:47:06 PM Version By: Gwenlyn PerkingMoore, Shelia Entered By: Elliot GurneyWoody, RN, BSN, Kim on 10/29/2015 13:48:21 Kuchera, [name] ([id number]) -------------------------------------------------------------------------------- Multi Wound Chart Details Patient Name: [name], [name] Date of Service: 10/29/2015 1:30 PM Medical Record Number: [id number] Patient Account Number: [id number] Date of Birth/Sex: [birth year] (50 y.o. Female) Treating RN: Huel CoventryWoody, Kim Primary Care Physician: [place], JULIE Other Clinician: Referring Physician: [place], JULIE Treating Physician/Extender: Rudene ReBritto, [name] Weeks in Treatment: 39 Vital Signs Height(in): 63 Pulse(bpm): 97 Weight(lbs): 100 Blood Pressure 96/65 (mmHg): Body Mass Index(BMI): 18 Temperature(F): 98.4 Respiratory Rate 18 (breaths/min): Photos: [1:No Photos] [N/A:N/A] Wound Location: [1:Right Scapula] [N/A:N/A] Wounding Event: [1:Pressure Injury] [N/A:N/A] Primary Etiology: [1:Pressure Ulcer] [N/A:N/A] Date Acquired: [1:09/21/2014] [N/A:N/A] Weeks of Treatment: [1:39] [N/A:N/A] Wound Status: [1:Open] [N/A:N/A] Measurements L x W x D 1x1x0.3 [N/A:N/A] (cm) Area (cm) : [1:0.785] [N/A:N/A] Volume (cm) : [1:0.236] [N/A:N/A] % Reduction in Area: [1:35.90%] [N/A:N/A] % Reduction in Volume: 75.90% [N/A:N/A] Classification: [1:Category/Stage IV] [N/A:N/A] Periwound Skin Texture: No Abnormalities Noted [N/A:N/A] Periwound Skin [1:No Abnormalities Noted] [N/A:N/A] Moisture: Periwound Skin Color: No Abnormalities Noted [N/A:N/A] Tenderness on [1:No]  [N/A:N/A] Treatment Notes Electronic Signature(s) Signed: 10/29/2015 4:53:26 PM By: Elliot GurneyWoody, RN, BSN, Kim RN, BSN Entered By: Elliot GurneyWoody, RN, BSN, Kim on 10/29/2015 13:33:04 Posey, [name] ([id number]) -------------------------------------------------------------------------------- Multi-Disciplinary Care Plan Details Patient Name: [name], [name] Date of Service: 10/29/2015 1:30 PM Medical Record Number: [id number] Patient Account Number: [id number] Date of Birth/Sex: [birth year] (50 y.o. Female) Treating RN: Huel CoventryWoody, Kim Primary Care Physician: [place], JULIE Other Clinician: Referring Physician: [place], JULIE Treating Physician/Extender: Rudene ReBritto, [name] Weeks  in Treatment: 39 Active Inactive Abuse / Safety / Falls / Self Care Management Nursing Diagnoses: Impaired home maintenance Impaired physical mobility Potential for falls Self care deficit: actual or potential Goals: Patient/caregiver will verbalize understanding of skin care regimen Date Initiated: 01/27/2015 Goal Status: Active Patient/caregiver will verbalize/demonstrate measure taken to improve self care Date Initiated: 01/27/2015 Goal Status: Active Patient/caregiver will verbalize/demonstrate measures taken to improve the patient's personal safety Date Initiated: 01/27/2015 Goal Status: Active Patient/caregiver will verbalize/demonstrate measures taken to prevent injury and/or falls Date Initiated: 01/27/2015 Goal Status: Active Patient/caregiver will verbalize/demonstrate understanding of what to do in case of emergency Date Initiated: 01/27/2015 Goal Status: Active Interventions: Assess fall risk on admission and as needed Assess: immobility, friction, shearing, incontinence upon admission and as needed Assess impairment of mobility on admission and as needed per policy Assess self care needs on admission and as needed Provide education on basic hygiene Provide education on fall prevention Provide education on personal  and home safety Provide education on safe transfers JOELIE, SCHOU ([id number]) Treatment Activities: Education provided on Basic Hygiene : 07/08/2015 Notes: Orientation to the Wound Care Program Nursing Diagnoses: Knowledge deficit related to the wound healing center program Goals: Patient/caregiver will verbalize understanding of the Wound Healing Center Program Date Initiated: 01/27/2015 Goal Status: Active Interventions: Provide education on orientation to the wound center Notes: Pressure Nursing Diagnoses: Knowledge deficit related to causes and risk factors for pressure ulcer development Knowledge deficit related to management of pressures ulcers Potential for impaired tissue integrity related to pressure, friction, moisture, and shear Goals: Patient will remain free from development of additional pressure ulcers Date Initiated: 01/27/2015 Goal Status: Active Patient will remain free of pressure ulcers Date Initiated: 01/27/2015 Goal Status: Active Patient/caregiver will verbalize risk factors for pressure ulcer development Date Initiated: 01/27/2015 Goal Status: Active Patient/caregiver will verbalize understanding of pressure ulcer management Date Initiated: 01/27/2015 Goal Status: Active Interventions: Assess: immobility, friction, shearing, incontinence upon admission and as needed Assess offloading mechanisms upon admission and as needed Assess potential for pressure ulcer upon admission and as needed Provide education on pressure ulcers LYLIANNA, FRAISER ([id number]) Treatment Activities: Patient referred for pressure reduction/relief devices : 07/08/2015 Patient referred for seating evaluation to ensure proper offloading : 07/08/2015 Notes: Wound/Skin Impairment Nursing Diagnoses: Impaired tissue integrity Knowledge deficit related to smoking impact on wound healing Knowledge deficit related to ulceration/compromised skin integrity Goals: Patient/caregiver will  verbalize understanding of skin care regimen Date Initiated: 01/27/2015 Goal Status: Active Ulcer/skin breakdown will have a volume reduction of 30% by week 4 Date Initiated: 01/27/2015 Goal Status: Active Ulcer/skin breakdown will have a volume reduction of 50% by week 8 Date Initiated: 01/27/2015 Goal Status: Active Ulcer/skin breakdown will have a volume reduction of 80% by week 12 Date Initiated: 01/27/2015 Goal Status: Active Ulcer/skin breakdown will heal within 14 weeks Date Initiated: 01/27/2015 Goal Status: Active Interventions: Assess ulceration(s) every visit Provide education on ulcer and skin care Notes: Electronic Signature(s) Signed: 10/29/2015 4:53:26 PM By: Elliot Gurney, RN, BSN, Kim RN, BSN Entered By: Elliot Gurney, RN, BSN, Kim on 10/29/2015 13:32:57 Degraaf, [name] ([id number]) -------------------------------------------------------------------------------- Pain Assessment Details Patient Name: [name], [name] Date of Service: 10/29/2015 1:30 PM Medical Record Number: [id number] Patient Account Number: [id number] Date of Birth/Sex: 1965/10/11 (50 y.o. Female) Treating RN: Huel Coventry Primary Care Physician: [place], JULIE Other Clinician: Referring Physician: [place], JULIE Treating Physician/Extender: Rudene Re in Treatment: 88 Active Problems Location of Pain Severity and Description of Pain Patient Has Paino No Site Locations  Pain Management and Medication Current Pain Management: Electronic Signature(s) Signed: 10/29/2015 4:53:26 PM By: Elliot Gurney, RN, BSN, Kim RN, BSN Entered By: Elliot Gurney, RN, BSN, Kim on 10/29/2015 13:21:26 Hannan, [name] ([id number]) -------------------------------------------------------------------------------- Patient/Caregiver Education Details Patient Name: [name], [name] Date of Service: 10/29/2015 1:30 PM Medical Record Number: [id number] Patient Account Number: [id number] Date of Birth/Gender: July 24, 1965 (50 y.o. Female) Treating  RN: Huel Coventry Primary Care Physician: [place], JULIE Other Clinician: Referring Physician: [place], JULIE Treating Physician/Extender: Rudene Re in Treatment: 60 Education Assessment Education Provided To: Patient and Caregiver Education Topics Provided Pressure: Handouts: Pressure Ulcers: Care and Offloading Methods: Demonstration, Explain/Verbal Responses: State content correctly Wound/Skin Impairment: Handouts: Caring for Your Ulcer, Other: follow up with surgical appt. Methods: Explain/Verbal Responses: State content correctly Electronic Signature(s) Signed: 10/29/2015 4:53:26 PM By: Elliot Gurney, RN, BSN, Kim RN, BSN Entered By: Elliot Gurney, RN, BSN, Kim on 10/29/2015 13:50:18 Doggett, [name] ([id number]) -------------------------------------------------------------------------------- Wound Assessment Details Patient Name: [name], [name] Date of Service: 10/29/2015 1:30 PM Medical Record Number: [id number] Patient Account Number: [id number] Date of Birth/Sex: August 09, 1965 (50 y.o. Female) Treating RN: Huel Coventry Primary Care Physician: [place], JULIE Other Clinician: Referring Physician: [place], JULIE Treating Physician/Extender: Rudene Re in Treatment: 39 Wound Status Wound Number: 1 Primary Pressure Ulcer Etiology: Wound Location: Right Scapula Wound Open Wounding Event: Pressure Injury Status: Date Acquired: 09/21/2014 Comorbid Asthma, Chronic Obstructive Weeks Of Treatment: 39 History: Pulmonary Disease (COPD), Clustered Wound: No Paraplegia Wound Measurements Length: (cm) 1 Width: (cm) 1 Depth: (cm) 0.3 Area: (cm) 0.785 Volume: (cm) 0.236 % Reduction in Area: 35.9% % Reduction in Volume: 75.9% Epithelialization: None Undermining: Yes Starting Position (o'clock): 12 Ending Position (o'clock): 6 Maximum Distance: (cm) 1 Wound Description Classification: Category/Stage IV Wound Margin: Flat and Intact Exudate Amount: Large Exudate Type:  Serosanguineous Exudate Color: red, brown Foul Odor After Cleansing: Yes Due to Product Use: No Wound Bed Granulation Amount: Large (67-100%) Exposed Structure Granulation Quality: Red, Pink Fascia Exposed: No Necrotic Amount: Small (1-33%) Fat Layer Exposed: No Necrotic Quality: Adherent Slough Tendon Exposed: No Muscle Exposed: No Joint Exposed: No Bone Exposed: Yes Periwound Skin Texture Texture Color No Abnormalities Noted: No No Abnormalities Noted: No Callus: No Atrophie Blanche: No [name], [name] ([id number]) Crepitus: No Cyanosis: No Excoriation: No Ecchymosis: No Fluctuance: No Erythema: No Friable: No Hemosiderin Staining: No Induration: No Mottled: No Localized Edema: No Pallor: No Rash: No Rubor: No Scarring: No Temperature / Pain Moisture Temperature: No Abnormality No Abnormalities Noted: No Tenderness on Palpation: Yes Dry / Scaly: No Maceration: No Moist: Yes Wound Preparation Ulcer Cleansing: Rinsed/Irrigated with Saline Topical Anesthetic Applied: Other: lidocaine 4%, Treatment Notes Wound #1 (Right Scapula) 1. Cleansed with: Clean wound with Normal Saline 2. Anesthetic Topical Lidocaine 4% cream to wound bed prior to debridement 3. Peri-wound Care: Skin Prep 4. Dressing Applied: Aquacel Ag 5. Secondary Dressing Applied Bordered Foam Dressing Electronic Signature(s) Signed: 10/29/2015 4:53:26 PM By: Elliot Gurney, RN, BSN, Kim RN, BSN Entered By: Elliot Gurney, RN, BSN, Kim on 10/29/2015 13:41:11 Durkin, [name] ([id number]) -------------------------------------------------------------------------------- Vitals Details Patient Name: [name], [name] Date of Service: 10/29/2015 1:30 PM Medical Record Number: [id number] Patient Account Number: [id number] Date of Birth/Sex: 06-21-1966 (50 y.o. Female) Treating RN: Huel Coventry Primary Care Physician: [place], JULIE Other Clinician: Referring Physician: [place], JULIE Treating Physician/Extender:  Rudene Re in Treatment: 60 Vital Signs Time Taken: 13:25 Temperature (F): 98.4 Height (in): 63 Pulse (bpm): 97 Weight (lbs): 100 Respiratory Rate (breaths/min): 18 Body Mass Index (BMI): 17.7  Blood Pressure (mmHg): 96/65 Reference Range: 80 - 120 mg / dl Electronic Signature(s) Signed: 10/29/2015 4:53:26 PM By: Elliot Gurney, RN, BSN, Kim RN, BSN Entered By: Elliot Gurney, RN, BSN, Kim on 10/29/2015 13:25:34

## 2015-11-12 ENCOUNTER — Encounter: Payer: Medicare Other | Admitting: Surgery

## 2015-11-18 ENCOUNTER — Ambulatory Visit: Payer: Medicare Other | Admitting: Surgery

## 2015-11-25 ENCOUNTER — Emergency Department: Payer: Medicare Other

## 2015-11-25 ENCOUNTER — Emergency Department
Admission: EM | Admit: 2015-11-25 | Discharge: 2015-11-25 | Disposition: A | Discharge status: Home / Self Care | Payer: Medicare Other | Attending: Student | Admitting: Student

## 2015-11-25 ENCOUNTER — Encounter: Payer: Self-pay | Admitting: Medical Oncology

## 2015-11-25 DIAGNOSIS — Z792 Long term (current) use of antibiotics: Secondary | ICD-10-CM | POA: Diagnosis not present

## 2015-11-25 DIAGNOSIS — Z79899 Other long term (current) drug therapy: Secondary | ICD-10-CM | POA: Insufficient documentation

## 2015-11-25 DIAGNOSIS — Y939 Activity, unspecified: Secondary | ICD-10-CM | POA: Diagnosis not present

## 2015-11-25 DIAGNOSIS — Y929 Unspecified place or not applicable: Secondary | ICD-10-CM | POA: Diagnosis not present

## 2015-11-25 DIAGNOSIS — S161XXA Strain of muscle, fascia and tendon at neck level, initial encounter: Secondary | ICD-10-CM | POA: Diagnosis not present

## 2015-11-25 DIAGNOSIS — X58XXXA Exposure to other specified factors, initial encounter: Secondary | ICD-10-CM | POA: Insufficient documentation

## 2015-11-25 DIAGNOSIS — S169XXA Unspecified injury of muscle, fascia and tendon at neck level, initial encounter: Secondary | ICD-10-CM | POA: Diagnosis present

## 2015-11-25 DIAGNOSIS — Y999 Unspecified external cause status: Secondary | ICD-10-CM | POA: Insufficient documentation

## 2015-11-25 HISTORY — DX: Other specified postprocedural states: Z98.890

## 2015-11-25 HISTORY — DX: Paraplegia, unspecified: G82.20

## 2015-11-25 MED ORDER — IBUPROFEN 400 MG PO TABS
600.0000 mg | ORAL_TABLET | Freq: Once | ORAL | Status: AC
  Administered 2015-11-25: 600 mg via ORAL
  Filled 2015-11-25: qty 2

## 2015-11-25 NOTE — ED Notes (Signed)
Pt reports she was asleep and woke up around 0230 this am with left sided neck pain. Pt denies other sx's.

## 2015-11-25 NOTE — ED Provider Notes (Signed)
[facility] Emergency Department Provider Note   ____________________________________________  Time seen: Approximately 3:04 PM  I have reviewed the triage vital signs and the nursing notes.   HISTORY  Chief Complaint Neck Pain    HPI [name] is a 50 y.o. female with history of traumatic paraplegia who presents for evaluation of atraumatic left posterior neck pain that began this morning, constant since onset, moderate, no modifying factors. Patient reports that she went to bed last night in her usual state of health and then woke up this morning at 2:30 am with pain in the left neck. She has been taking her pain medications at home but reports that her pain persists. She denies any new numbness, weakness, no headache, no shortness of breath, no anterior neck swelling, no fevers. She has otherwise been in her usual state of health. She is specifically worried about the "bolts in my neck".   Past Medical History  Diagnosis Date  . Paraplegic spinal paralysis (HCC)   . History of urostomy     There are no active problems to display for this patient.   No past surgical history on file.  Current Outpatient Rx  Name  Route  Sig  Dispense  Refill  . brompheniramine-pseudoephedrine-DM 30-2-10 MG/5ML syrup   Oral   Take 5 mLs by mouth 4 (four) times daily as needed.   120 mL   0   . sulfamethoxazole-trimethoprim (BACTRIM DS,SEPTRA DS) 800-160 MG per tablet   Oral   Take 1 tablet by mouth 2 (two) times daily.   20 tablet   0   . traMADol (ULTRAM) 50 MG tablet   Oral   Take 1 tablet (50 mg total) by mouth every 6 (six) hours as needed for moderate pain.   12 tablet   0     Allergies Shellfish allergy  No family history on file.  Social History Social History  Substance Use Topics  . Smoking status: Never Smoker   . Smokeless tobacco: None  . Alcohol Use: No    Review of Systems Constitutional: No fever/chills Eyes: No  visual changes. ENT: No sore throat. Cardiovascular: Denies chest pain. Respiratory: Denies shortness of breath. Gastrointestinal: No abdominal pain.  No nausea, no vomiting.  No diarrhea.  No constipation. Genitourinary: Negative for dysuria. Musculoskeletal: Negative for back pain. Skin: Negative for rash. Neurological: Negative for headaches, focal weakness or numbness.  10-point ROS otherwise negative.  ____________________________________________   PHYSICAL EXAM:  VITAL SIGNS: ED Triage Vitals  Enc Vitals Group     BP 11/25/15 1437 101/80 mmHg     Pulse Rate 11/25/15 1437 86     Resp 11/25/15 1437 18     Temp 11/25/15 1437 97.7 F (36.5 C)     Temp Source 11/25/15 1437 Oral     SpO2 11/25/15 1437 97 %     Weight 11/25/15 1437 93 lb (42.185 kg)     Height 11/25/15 1437 5\' 3"  (1.6 m)     Head Cir --      Peak Flow --      Pain Score 11/25/15 1437 10     Pain Loc --      Pain Edu? --      Excl. in GC? --     Constitutional: Alert and oriented. Well appearing and in no acute distress. Eyes: Conjunctivae are normal. PERRL. EOMI. Head: Atraumatic. Nose: No congestion/rhinnorhea. Mouth/Throat: Mucous membranes are moist.  Oropharynx non-erythematous And nonedematous. No drooling,  handling all secretions well. Neck: No stridor.  No cervical spine tenderness to palpation.Moderate tender to palpation throughout the left cervical paraspinal muscles in the left trapezius muscles. No swelling or tenderness throughout the anterior neck.Neck is supple without meningismus. She does have full lateral motion of the head/neck though movement does cause pain. Cardiovascular: Normal rate, regular rhythm. Grossly normal heart sounds.  Good peripheral circulation. Respiratory: Normal respiratory effort.  No retractions. Lungs CTAB. Gastrointestinal: Soft and nontender. No distention.  No CVA tenderness. Genitourinary: deferred Musculoskeletal: No lower extremity tenderness nor edema.  No  joint effusions. Neurologic:  Normal speech and language. Chronic weakness with muscle wasting of the arms and legs secondary to paraplegia. Skin:  Skin is warm, dry and intact. No rash noted. Psychiatric: Mood and affect are normal. Speech and behavior are normal.  ____________________________________________   LABS (all labs ordered are listed, but only abnormal results are displayed)  Labs Reviewed - No data to display ____________________________________________  EKG  none ____________________________________________  RADIOLOGY  Xray c-spine  FINDINGS: No prevertebral soft tissue swelling. Normal anterior-posterior alignment with straightening of the upper cervical spine and mild reversed lordosis lower cervical spine. There is diffuse osteopenia and the C2 through C6 vertebral bodies appear to be fused. There is posterior element fusion with spinal rods at these levels as well. No evidence of acute osseous or hardware abnormality. No focal soft tissue abnormalities.  IMPRESSION: Chronic findings with no acute process ____________________________________________   PROCEDURES  Procedure(s) performed: None  Critical Care performed: No  ____________________________________________   INITIAL IMPRESSION / ASSESSMENT AND PLAN / ED COURSE  Pertinent labs & imaging results that were available during my care of the patient were reviewed by me and considered in my medical decision making (see chart for details).  [name] is a 50 y.o. female with history of traumatic paraplegia who presents for evaluation of atraumatic left posterior neck pain that began this morning. On exam, she is very well-appearing and in no acute distress. Her vital signs are stable, she is afebrile. Her blood pressure is 101/80 however I reviewed her recent primary care doctor visit on 11/22/2015 and this is her baseline blood pressure. She does have tenderness to palpation throughout the  paraspinal muscles on the left as well as the left trapezius and I suspect that this is cervical strain/musculoskeletal pain however will obtain plain films to evaluate the placement of hardware as that is her primary concern. No new neurological deficits, no fevers.  ----------------------------------------- 4:21 PM on 11/25/2015 ----------------------------------------- Patient continues to appear comfortable. She sitting up in her wheelchair in no acute distress. Her plain films are negative for any acute process. We discussed that she would take ibuprofen as well as her home dose of Percocet for treatment of pain and follow-up with her doctor tomorrow. We discussed meticulous return precautions, need for close follow-up and she is comfortable with the discharge plan. DC home. ____________________________________________   FINAL CLINICAL IMPRESSION(S) / ED DIAGNOSES  Final diagnoses:  Cervical strain, acute, initial encounter      NEW MEDICATIONS STARTED DURING THIS VISIT:  New Prescriptions   No medications on file     Note:  This document was prepared using Dragon voice recognition software and may include unintentional dictation errors.    Gayla Doss, MD 11/25/15 [phone]

## 2015-12-02 ENCOUNTER — Ambulatory Visit: Payer: Medicare Other | Admitting: Surgery

## 2015-12-16 ENCOUNTER — Encounter: Payer: Medicare Other | Attending: Surgery | Admitting: Surgery

## 2015-12-16 DIAGNOSIS — G8252 Quadriplegia, C1-C4 incomplete: Secondary | ICD-10-CM | POA: Diagnosis not present

## 2015-12-16 DIAGNOSIS — E440 Moderate protein-calorie malnutrition: Secondary | ICD-10-CM | POA: Diagnosis not present

## 2015-12-16 DIAGNOSIS — L89114 Pressure ulcer of right upper back, stage 4: Secondary | ICD-10-CM | POA: Diagnosis not present

## 2015-12-17 NOTE — Progress Notes (Signed)
TACY, CHAVIS ([id number]) Visit Report for 12/16/2015 Chief Complaint Document Details Patient Name: [name], [name] Date of Service: 12/16/2015 2:15 PM Medical Record Number: [id number] Patient Account Number: [id number] Date of Birth/Sex: 06-Jun-1966 (50 y.o. Female) Treating RN: Ashok Cordia, Debi Primary Care Physician: [place], JULIE Other Clinician: Referring Physician: [place], JULIE Treating Physician/Extender: Rudene Re in Treatment: 38 Information Obtained from: Patient Chief Complaint Patient presents to the wound care center for a consult due non healing wound. 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 She comes in for a pressure injury to the right scapular region and the sacral region. She has this for several months. Electronic Signature(s) Signed: 12/16/2015 2:40:58 PM By: Evlyn Kanner MD, FACS Entered By: Evlyn Kanner on 12/16/2015 14:40:57 Wenz, [name] ([id number]) -------------------------------------------------------------------------------- HPI Details Patient Name: [name] Date of Service: 12/16/2015 2:15 PM Medical Record Number: [id number] Patient Account Number: [id number] Date of Birth/Sex: 09/16/1965 (50 y.o. 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 For about 4-5 months she's had a decubitus ulcer on the right scapular region and she has a couple of small areas which have opened out in the region of previous scar tissue in the sacral region. He does not have any other significant comorbidities. She has a good Roho cushion for  her wheelchair and she has a specialized mattress for sleep. She has stopped smoking a year ago. 02/11/2015 -- she has not yet got her appointment with the plastic surgeons at [facility]. They did a x-ray of the sacral region this afternoon just before coming here. 02/18/2015 -- X-ray of the pelvis -- IMPRESSION:No acute osseous injury of the pelvis. If there is clinical concern regarding osteomyelitis, recommend further evaluation with CT. X-ray of the sacrum and coccyx IMPRESSION:No acute osseous abnormality of the sacrum. If there is further clinical concern regarding osteomyelitis of the sacrum, further evaluation with CT is recommended. 03/11/2015 -- she has yet to obtain the appointment with the plastic surgeons at [facility]. She says she will be diligent and follow-up on this. 03/18/2015 -- she has an appointment to see the plastic surgeons at [facility] this coming Monday. Also has some issues with her air mattress and is working on getting this replaced. 03/25/2015 -- her air mattress is working okay now and she does not yet have a firm appointment to see her plastic surgeons. 04/22/2015 -- he have not seen her for about a month and this was due to the fact that she had bronchitis, pneumothorax and was admitted to [facility]. We do not have a plastic surgery opinion as of yet. 12/22 2016 -- has been several months now and she has not been very compliant in getting an opinion with the plastic surgeons at [facility]. The appointments were canceled for various reasons and I have urged them to try and reschedule this as soon as possible. 07/08/2015 -- we have not yet received any appointment regarding her plastic surgery at [facility] and I don't believe the patient is being very compliant though she is very polite. 09/09/2015 -- she has not seen [place] for about 6 weeks due to various issues including pneumonia, bronchitis and a surgical insertion  of a pain pump. He has also not yet  seen her plastic surgeon for an opinion. 10/29/2015 -- it has been about 7 weeks since she has come to see as and has not yet seen her plastic surgeons.she continues to have several excuses for not keeping her appointments. 12/16/2015 -- it has been 6 weeks since she last came to see as and during this time she continues to battle with several health issues including urinary tract infections. [name], [name] ([id number]) Electronic Signature(s) Signed: 12/16/2015 2:41:27 PM By: Evlyn Kanner MD, FACS Entered By: Evlyn Kanner on 12/16/2015 14:41:27 Rosengrant, [name] ([id number]) -------------------------------------------------------------------------------- Physical Exam Details Patient Name: [name] Date of Service: 12/16/2015 2:15 PM Medical Record Number: [id number] Patient Account Number: [id number] Date of Birth/Sex: [birth year] (50 y.o. Female) Treating RN: Ashok Cordia, Debi Primary Care Physician: [place], JULIE Other Clinician: Referring Physician: [place], JULIE Treating Physician/Extender: Rudene Re in Treatment: 46 Constitutional . Pulse regular. Respirations normal and unlabored. Afebrile. . Eyes Nonicteric. Reactive to light. Ears, Nose, Mouth, and Throat Lips, teeth, and gums WNL.Marland Kitchen Moist mucosa without lesions. Neck supple and nontender. No palpable supraclavicular or cervical adenopathy. Normal sized without goiter. Respiratory WNL. No retractions.. Cardiovascular Pedal Pulses WNL. No clubbing, cyanosis or edema. Lymphatic No adneopathy. No adenopathy. No adenopathy. Musculoskeletal Adexa without tenderness or enlargement.. Digits and nails w/o clubbing, cyanosis, infection, petechiae, ischemia, or inflammatory conditions.. Integumentary (Hair, Skin) No suspicious lesions. No crepitus or fluctuance. No peri-wound warmth or erythema. No masses.Marland Kitchen Psychiatric Judgement and insight Intact.. No evidence of depression, anxiety, or  agitation.. Notes the wound on the right scapula is smaller and has less undermining. She has got healthy granulation tissue and no debridement was required today. Electronic Signature(s) Signed: 12/16/2015 2:41:59 PM By: Evlyn Kanner MD, FACS Entered By: Evlyn Kanner on 12/16/2015 14:41:58 Tsuchiya, [name] ([id number]) -------------------------------------------------------------------------------- Physician Orders Details Patient Name: ATHENA, BALTZ. Date of Service: 12/16/2015 2:15 PM Medical Record Number: [id number] Patient Account Number: [id number] Date of Birth/Sex: 20-Aug-1965 (50 y.o. Female) Treating RN: Ashok Cordia, Debi Primary Care Physician: [place], JULIE Other Clinician: Referring Physician: [place], JULIE Treating Physician/Extender: Rudene Re in Treatment: 27 Verbal / Phone Orders: Yes Clinician: Ashok Cordia, Debi Read Back and Verified: Yes Diagnosis Coding Wound Cleansing Wound #1 Right Scapula o Clean wound with Normal Saline. o Cleanse wound with mild soap and water o May Shower, gently pat wound dry prior to applying new dressing. Anesthetic Wound #1 Right Scapula o Topical Lidocaine 4% cream applied to wound bed prior to debridement Primary Wound Dressing Wound #1 Right Scapula o Iodoform packing Gauze - 1/4" Secondary Dressing Wound #1 Right Scapula o Boardered Foam Dressing Dressing Change Frequency Wound #1 Right Scapula o Change dressing every other day. Follow-up Appointments Wound #1 Right Scapula o Return Appointment in 2 weeks. Off-Loading Wound #1 Right Scapula o Turn and reposition every 2 hours Additional Orders / Instructions Wound #1 Right Scapula o Increase protein intake. DARSI, TIEN ([id number]) Home Health Wound #1 Right Scapula o Continue Home Health Visits - [facility] o Home Health Nurse may visit PRN to address patientos wound care needs. o FACE TO FACE ENCOUNTER:  MEDICARE and MEDICAID PATIENTS: I certify that this patient is under my care and that I had a face-to-face encounter that meets the physician face-to-face encounter requirements with this patient on this date. The encounter with the patient was in whole or in part for the following MEDICAL CONDITION: (primary reason for Home Healthcare) MEDICAL NECESSITY:  I certify, that based on my findings, NURSING services are a medically necessary home health service. HOME BOUND STATUS: I certify that my clinical findings support that this patient is homebound (i.e., Due to illness or injury, pt requires aid of supportive devices such as crutches, cane, wheelchairs, walkers, the use of special transportation or the assistance of another person to leave their place of residence. There is a normal inability to leave the home and doing so requires considerable and taxing effort. Other absences are for medical reasons / religious services and are infrequent or of short duration when for other reasons). o If current dressing causes regression in wound condition, may D/C ordered dressing product/s and apply Normal Saline Moist Dressing daily until next Wound Healing Center / Other MD appointment. Notify Wound Healing Center of regression in wound condition at [phone]. o Please direct any NON-WOUND related issues/requests for orders to patient's Primary Care Physician Medications-please add to medication list. Wound #1 Right Scapula o Other: - Zinc, Vitamin C, MVI Electronic Signature(s) Signed: 12/16/2015 3:21:55 PM By: Evlyn Kanner MD, FACS Signed: 12/16/2015 4:48:24 PM By: Alejandro Mulling Entered By: Alejandro Mulling on 12/16/2015 14:35:11 Losee, [name] ([id number]) -------------------------------------------------------------------------------- Problem List Details Patient Name: JERSI, MCMASTER. Date of Service: 12/16/2015 2:15 PM Medical Record Number: [id number] Patient Account Number:  [id number] Date of Birth/Sex: 07/19/1965 (50 y.o. 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 Date of Service: 12/16/2015 2:15 PM Medical Record Number: [id number] Patient Account Number: [id number] Date of Birth/Sex: 1966/04/26 (50 y.o. Female) Treating RN: Ashok Cordia, Debi Primary Care Physician: [place], JULIE Other Clinician: Referring Physician: [place], JULIE Treating Physician/Extender: Rudene Re in Treatment: 42 Subjective Chief Complaint Information obtained from Patient Patient presents to the wound care center for a consult due non healing wound. 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 She comes in for a pressure injury to the right scapular region and the sacral region. She has this for several months. 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 For about 4-5 months she's had a decubitus ulcer on the right scapular region and she  has a couple of small areas which have opened out in the region of previous scar tissue in the sacral region. He does not have any other significant comorbidities. She has a good Roho cushion for her wheelchair and she has a specialized mattress for sleep. She has stopped smoking a year ago. 02/11/2015 -- she has not yet got her appointment with the plastic surgeons at [facility]. They did a x-ray of the sacral region this afternoon just before coming here. 02/18/2015 -- X-ray of the pelvis -- IMPRESSION:No acute osseous injury of the pelvis. If there is clinical concern regarding osteomyelitis, recommend further evaluation with CT. X-ray  of the sacrum and coccyx IMPRESSION:No acute osseous abnormality of the sacrum. If there is further clinical concern regarding osteomyelitis of the sacrum, further evaluation with CT is recommended. 03/11/2015 -- she has yet to obtain the appointment with the plastic surgeons at [facility]. She says she will be diligent and follow-up on this. 03/18/2015 -- she has an appointment to see the plastic surgeons at [facility] this coming Monday. Also has some issues with her air mattress and is working on getting this replaced. 03/25/2015 -- her air mattress is working okay now and she does not yet have a firm appointment to see her plastic surgeons. 04/22/2015 -- he have not seen her for about a month and this was due to the fact that she had bronchitis, pneumothorax and was admitted to [facility]. We do not have a plastic surgery opinion as of yet. 12/22 2016 -- has been several months now and she has not been very compliant in getting an opinion with the plastic surgeons at [facility]. The appointments were canceled for various reasons and I have urged them to try and reschedule this as soon as possible. 07/08/2015 -- we have not yet received any appointment regarding her plastic surgery at [facility] and I don't believe the patient is being very compliant  though she is very polite. ALONDRA, VANDEVEN ([id number]) 09/09/2015 -- she has not seen [place] for about 6 weeks due to various issues including pneumonia, bronchitis and a surgical insertion of a pain pump. He has also not yet seen her plastic surgeon for an opinion. 10/29/2015 -- it has been about 7 weeks since she has come to see as and has not yet seen her plastic surgeons.she continues to have several excuses for not keeping her appointments. 12/16/2015 -- it has been 6 weeks since she last came to see as and during this time she continues to battle with several health issues including urinary tract infections. Objective Constitutional Pulse regular. Respirations normal and unlabored. Afebrile. Vitals Time Taken: 2:24 PM, Height: 63 in, Weight: 100 lbs, BMI: 17.7, Temperature: 98.3 F, Pulse: 83 bpm, Respiratory Rate: 18 breaths/min, Blood Pressure: 106/68 mmHg. Eyes Nonicteric. Reactive to light. Ears, Nose, Mouth, and Throat Lips, teeth, and gums WNL.Marland Kitchen Moist mucosa without lesions. Neck supple and nontender. No palpable supraclavicular or cervical adenopathy. Normal sized without goiter. Respiratory WNL. No retractions.. Cardiovascular Pedal Pulses WNL. No clubbing, cyanosis or edema. Lymphatic No adneopathy. No adenopathy. No adenopathy. Musculoskeletal Adexa without tenderness or enlargement.. Digits and nails w/o clubbing, cyanosis, infection, petechiae, ischemia, or inflammatory conditions.Marland Kitchen Psychiatric Judgement and insight Intact.. No evidence of depression, anxiety, or agitation.. General Notes: the wound on the right scapula is smaller and has less undermining. She has got healthy Musick, Etta M. ([id number]) granulation tissue and no debridement was required today. Integumentary (Hair, Skin) No suspicious lesions. No crepitus or fluctuance. No peri-wound warmth or erythema. No masses.. Wound #1 status is Open. Original cause of wound was Pressure Injury. The wound  is located on the Right Scapula. The wound measures 1.3cm length x 0.3cm width x 0.4cm depth; 0.306cm^2 area and 0.123cm^3 volume. There is bone exposed. There is no tunneling or undermining noted. There is a large amount of serous drainage noted. The wound margin is flat and intact. There is large (67-100%) red, pink granulation within the wound bed. There is a small (1-33%) amount of necrotic tissue within the wound bed including Adherent Slough. The periwound skin appearance exhibited: Moist. The periwound skin  appearance did not exhibit: Callus, Crepitus, Excoriation, Fluctuance, Friable, Induration, Localized Edema, Rash, Scarring, Dry/Scaly, Maceration, Atrophie Blanche, Cyanosis, Ecchymosis, Hemosiderin Staining, Mottled, Pallor, Rubor, Erythema. Periwound temperature was noted as No Abnormality. The periwound has tenderness on palpation. Assessment Active Problems ICD-10 L89.114 - Pressure ulcer of right upper back, stage 4 G82.52 - Quadriplegia, C1-C4 incomplete E44.0 - Moderate protein-calorie malnutrition Plan Wound Cleansing: Wound #1 Right Scapula: Clean wound with Normal Saline. Cleanse wound with mild soap and water May Shower, gently pat wound dry prior to applying new dressing. Anesthetic: Wound #1 Right Scapula: Topical Lidocaine 4% cream applied to wound bed prior to debridement Primary Wound Dressing: Wound #1 Right Scapula: Iodoform packing Gauze - 1/4" Secondary Dressing: Wound #1 Right Scapula: Boardered Foam Dressing Dressing Change Frequency: LATROYA, NG. ([id number]) Wound #1 Right Scapula: Change dressing every other day. Follow-up Appointments: Wound #1 Right Scapula: Return Appointment in 2 weeks. Off-Loading: Wound #1 Right Scapula: Turn and reposition every 2 hours Additional Orders / Instructions: Wound #1 Right Scapula: Increase protein intake. Home Health: Wound #1 Right Scapula: Continue Home Health Visits - [facility] Home Health Nurse may visit PRN to address patient s wound care needs. FACE TO FACE ENCOUNTER: MEDICARE and MEDICAID PATIENTS: I certify that this patient is under my care and that I had a face-to-face encounter that meets the physician face-to-face encounter requirements with this patient on this date. The encounter with the patient was in whole or in part for the following MEDICAL CONDITION: (primary reason for Home Healthcare) MEDICAL NECESSITY: I certify, that based on my findings, NURSING services are a medically necessary home health service. HOME BOUND STATUS: I certify that my clinical findings support that this patient is homebound (i.e., Due to illness or injury, pt requires aid of supportive devices such as crutches, cane, wheelchairs, walkers, the use of special transportation or the assistance of another person to leave their place of residence. There is a normal inability to leave the home and doing so requires considerable and taxing effort. Other absences are for medical reasons / religious services and are infrequent or of short duration when for other reasons). If current dressing causes regression in wound condition, may D/C ordered dressing product/s and apply Normal Saline Moist Dressing daily until next Wound Healing Center / Other MD appointment. Notify Wound Healing Center of regression in wound condition at [phone]. Please direct any NON-WOUND related issues/requests for orders to patient's Primary Care Physician Medications-please add to medication list.: Wound #1 Right Scapula: Other: - Zinc, Vitamin C, MVI We will continue with 1/4 inch iodoform gauze to be packed into the area of her right shoulder and apply appropriate foam border. She is under complex care and we will continue to support her to the best of our ability. Electronic Signature(s) Signed: 12/16/2015 2:42:52 PM By: Evlyn Kanner MD, FACS DEVLYNN, KNOFF ([id number]) Entered By: Evlyn Kanner on 12/16/2015 14:42:52 Welter, [name] ([id number]) -------------------------------------------------------------------------------- SuperBill Details Patient Name: AVIKA, CARBINE. Date of Service: 12/16/2015 Medical Record Number: [id number] Patient Account Number: [id number] Date of Birth/Sex: 1966/06/19 (50 y.o. 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, stage 4 G82.52 Quadriplegia, C1-C4 incomplete E44.0 Moderate protein-calorie malnutrition Facility Procedures CPT4 Code: [id number] Description: 99213 - WOUND CARE VISIT-LEV 3 EST PT Modifier: Quantity: 1 Physician Procedures CPT4 Code:  [id number] Description: 99213 - WC PHYS LEVEL 3 - EST PT ICD-10 Description Diagnosis 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

## 2015-12-17 NOTE — Progress Notes (Signed)
[name] ([id number]) Visit Report for 12/16/2015 Arrival Information Details Patient Name: [name], [name] Date of Service: 12/16/2015 2:15 PM Medical Record Number: [id number] Patient Account Number: [id number] Date of Birth/Sex: 06-Aug-1965 (50 y.o. Female) Treating RN: Ashok Cordia, Debi Primary Care Physician: [place], JULIE Other Clinician: Referring Physician: [place], JULIE Treating Physician/Extender: Rudene Re in Treatment: 19 Visit Information History Since Last Visit All ordered tests and consults were completed: No Patient Arrived: Wheel Chair Added or deleted any medications: No Arrival Time: 14:15 Any new allergies or adverse reactions: No Accompanied By: self Had a fall or experienced change in No activities of daily living that may affect Transfer Assistance: Michiel Sites Lift risk of falls: Patient Identification Verified: Yes Signs or symptoms of abuse/neglect since last No Secondary Verification Process Yes visito Completed: Hospitalized since last visit: No Patient Requires Transmission-Based No Pain Present Now: No Precautions: Patient Has Alerts: No Electronic Signature(s) Signed: 12/16/2015 4:48:24 PM By: Alejandro Mulling Entered By: Alejandro Mulling on 12/16/2015 14:24:37 [name] ([id number]) -------------------------------------------------------------------------------- Clinic Level of Care Assessment Details Patient Name: [name] Date of Service: 12/16/2015 2:15 PM Medical Record Number: [id number] Patient Account Number: [id number] Date of Birth/Sex: June 19, 1966 (50 y.o. Female) Treating RN: Ashok Cordia, Debi Primary Care Physician: [place], JULIE Other Clinician: Referring Physician: [place], JULIE Treating Physician/Extender: Rudene Re in Treatment: 52 Clinic Level of Care Assessment Items TOOL 4 Quantity Score X - Use when only an EandM is performed on FOLLOW-UP visit 1 0 ASSESSMENTS - Nursing Assessment /  Reassessment X - Reassessment of Co-morbidities (includes updates in patient status) 1 10 X - Reassessment of Adherence to Treatment Plan 1 5 ASSESSMENTS - Wound and Skin Assessment / Reassessment X - Simple Wound Assessment / Reassessment - one wound 1 5  - Complex Wound Assessment / Reassessment - multiple wounds 0  - Dermatologic / Skin Assessment (not related to wound area) 0 ASSESSMENTS - Focused Assessment  - Circumferential Edema Measurements - multi extremities 0  - Nutritional Assessment / Counseling / Intervention 0  - Lower Extremity Assessment (monofilament, tuning fork, pulses) 0  - Peripheral Arterial Disease Assessment (using hand held doppler) 0 ASSESSMENTS - Ostomy and/or Continence Assessment and Care  - Incontinence Assessment and Management 0  - Ostomy Care Assessment and Management (repouching, etc.) 0 PROCESS - Coordination of Care  - Simple Patient / Family Education for ongoing care 0 X - Complex (extensive) Patient / Family Education for ongoing care 1 20  - Staff obtains Chiropractor, Records, Test Results / Process Orders 0 X - Staff telephones HHA, Nursing Homes / Clarify orders / etc 1 10  - Routine Transfer to another Facility (non-emergent condition) 0 [name], [name] ([id number])  - Routine Hospital Admission (non-emergent condition) 0  - New Admissions / Manufacturing engineer / Ordering NPWT, Apligraf, etc. 0  - Emergency Hospital Admission (emergent condition) 0 X - Simple Discharge Coordination 1 10  - Complex (extensive) Discharge Coordination 0 PROCESS - Special Needs  - Pediatric / Minor Patient Management 0  - Isolation Patient Management 0  - Hearing / Language / Visual special needs 0  - Assessment of Community assistance (transportation, D/C planning, etc.) 0  - Additional assistance / Altered mentation 0  - Support Surface(s) Assessment (bed, cushion, seat, etc.) 0 INTERVENTIONS - Wound Cleansing /  Measurement X - Simple Wound Cleansing - one wound 1 5  - Complex Wound Cleansing - multiple wounds 0 X - Wound Imaging (photographs - any number of wounds)  1 5 []  - Wound Tracing (instead of photographs) 0 X - Simple Wound Measurement - one wound 1 5 []  - Complex Wound Measurement - multiple wounds 0 INTERVENTIONS - Wound Dressings X - Small Wound Dressing one or multiple wounds 1 10 []  - Medium Wound Dressing one or multiple wounds 0 []  - Large Wound Dressing one or multiple wounds 0 X - Application of Medications - topical 1 5 []  - Application of Medications - injection 0 INTERVENTIONS - Miscellaneous []  - External ear exam 0 [name], [name] ([id number]) []  - Specimen Collection (cultures, biopsies, blood, body fluids, etc.) 0 []  - Specimen(s) / Culture(s) sent or taken to Lab for analysis 0 X - Patient Transfer (multiple staff / Michiel Sites Lift / Similar devices) 1 10 []  - Simple Staple / Suture removal (25 or less) 0 []  - Complex Staple / Suture removal (26 or more) 0 []  - Hypo / Hyperglycemic Management (close monitor of Blood Glucose) 0 []  - Ankle / Brachial Index (ABI) - do not check if billed separately 0 X - Vital Signs 1 5 Has the patient been seen at the hospital within the last three years: Yes Total Score: 105 Level Of Care: New/Established - Level 3 Electronic Signature(s) Signed: 12/16/2015 4:48:24 PM By: Alejandro Mulling Entered By: Alejandro Mulling on 12/16/2015 16:04:49 [name] ([id number]) -------------------------------------------------------------------------------- Encounter Discharge Information Details Patient Name: [name] Date of Service: 12/16/2015 2:15 PM Medical Record Number: [id number] Patient Account Number: [id number] Date of Birth/Sex: [birth year] (50 y.o. Female) Treating RN: Ashok Cordia, Debi Primary Care Physician: [place], JULIE Other Clinician: Referring Physician: [place], JULIE Treating Physician/Extender: Rudene Re in Treatment: 44 Encounter Discharge Information Items Discharge Pain Level: 0 Discharge Condition: Stable Ambulatory Status: Wheelchair Discharge Destination: Home Transportation: Other Accompanied By: self Schedule Follow-up Appointment: Yes Medication Reconciliation completed and provided to Patient/Care Yes Izyan Ezzell: Provided on Clinical Summary of Care: 12/16/2015 Form Type Recipient Paper Patient CW Electronic Signature(s) Signed: 12/16/2015 2:46:29 PM By: Gwenlyn Perking Entered By: Gwenlyn Perking on 12/16/2015 14:46:29 [name], [name] ([id number]) -------------------------------------------------------------------------------- Lower Extremity Assessment Details Patient Name: [name], [name]. Date of Service: 12/16/2015 2:15 PM Medical Record Number: [id number] Patient Account Number: [id number] Date of Birth/Sex: [birth year] (50 y.o. Female) Treating RN: Ashok Cordia, Debi Primary Care Physician: [place], JULIE Other Clinician: Referring Physician: [place], JULIE Treating Physician/Extender: Rudene Re in Treatment: 26 Electronic Signature(s) Signed: 12/16/2015 4:48:24 PM By: Alejandro Mulling Entered By: Alejandro Mulling on 12/16/2015 14:25:17 [name], [name] ([id number]) -------------------------------------------------------------------------------- Multi Wound Chart Details Patient Name: VIRDELL, HOILAND. Date of Service: 12/16/2015 2:15 PM Medical Record Number: [id number] Patient Account Number: [id number] Date of Birth/Sex: 1965/11/17 (50 y.o. Female) Treating RN: Ashok Cordia, Debi Primary Care Physician: [place], JULIE Other Clinician: Referring Physician: [place], JULIE Treating Physician/Extender: Rudene Re in Treatment: 46 Vital Signs Height(in): 63 Pulse(bpm): 83 Weight(lbs): 100 Blood Pressure 106/68 (mmHg): Body Mass Index(BMI): 18 Temperature(F): 98.3 Respiratory Rate 18 (breaths/min): Photos: [1:No Photos]  [N/A:N/A] Wound Location: [1:Right Scapula] [N/A:N/A] Wounding Event: [1:Pressure Injury] [N/A:N/A] Primary Etiology: [1:Pressure Ulcer] [N/A:N/A] Comorbid History: [1:Asthma, Chronic Obstructive Pulmonary Disease (COPD), Paraplegia] [N/A:N/A] Date Acquired: [1:09/21/2014] [N/A:N/A] Weeks of Treatment: [1:46] [N/A:N/A] Wound Status: [1:Open] [N/A:N/A] Measurements L x W x D 1.3x0.3x0.4 [N/A:N/A] (cm) Area (cm) : [1:0.306] [N/A:N/A] Volume (cm) : [1:0.123] [N/A:N/A] % Reduction in Area: [1:75.00%] [N/A:N/A] % Reduction in Volume: 87.40% [N/A:N/A] Classification: [1:Category/Stage IV] [N/A:N/A] Exudate Amount: [1:Large] [N/A:N/A] Exudate Type: [1:Serous] [N/A:N/A] Exudate Color: [1:amber] [N/A:N/A] Foul Odor After [1:Yes] [N/A:N/A] Cleansing:  Odor Anticipated Due to No [N/A:N/A] Product Use: Wound Margin: [1:Flat and Intact] [N/A:N/A] Granulation Amount: [1:Large (67-100%)] [N/A:N/A] Granulation Quality: [1:Red, Pink] [N/A:N/A] Necrotic Amount: [1:Small (1-33%)] [N/A:N/A] Exposed Structures: [N/A:N/A] Bone: Yes Fascia: No Fat: No Tendon: No Muscle: No Joint: No Epithelialization: None N/A N/A Periwound Skin Texture: Edema: No N/A N/A Excoriation: No Induration: No Callus: No Crepitus: No Fluctuance: No Friable: No Rash: No Scarring: No Periwound Skin Moist: Yes N/A N/A Moisture: Maceration: No Dry/Scaly: No Periwound Skin Color: Atrophie Blanche: No N/A N/A Cyanosis: No Ecchymosis: No Erythema: No Hemosiderin Staining: No Mottled: No Pallor: No Rubor: No Temperature: No Abnormality N/A N/A Tenderness on Yes N/A N/A Palpation: Wound Preparation: Ulcer Cleansing: N/A N/A Rinsed/Irrigated with Saline Topical Anesthetic Applied: Other: lidocaine 4% Treatment Notes Electronic Signature(s) Signed: 12/16/2015 4:48:24 PM By: Alejandro MullingPinkerton, Debra Entered By: Alejandro MullingPinkerton, Debra on 12/16/2015 14:34:33 [name], [name]  ([id number]) -------------------------------------------------------------------------------- Multi-Disciplinary Care Plan Details Patient Name: [name] MastersWADE, [name] Date of Service: 12/16/2015 2:15 PM Medical Record Number: [id number] Patient Account Number: [id number] Date of Birth/Sex: 1966-06-24 (50 y.o. Female) Treating RN: Ashok CordiaPinkerton, Debi Primary Care Physician: [place], JULIE Other Clinician: Referring Physician: [place], JULIE Treating Physician/Extender: Rudene ReBritto, [name] Weeks in Treatment: 4246 Active Inactive Abuse / Safety / Falls / Self Care Management Nursing Diagnoses: Impaired home maintenance Impaired physical mobility Potential for falls Self care deficit: actual or potential Goals: Patient/caregiver will verbalize understanding of skin care regimen Date Initiated: 01/27/2015 Goal Status: Active Patient/caregiver will verbalize/demonstrate measure taken to improve self care Date Initiated: 01/27/2015 Goal Status: Active Patient/caregiver will verbalize/demonstrate measures taken to improve the patient's personal safety Date Initiated: 01/27/2015 Goal Status: Active Patient/caregiver will verbalize/demonstrate measures taken to prevent injury and/or falls Date Initiated: 01/27/2015 Goal Status: Active Patient/caregiver will verbalize/demonstrate understanding of what to do in case of emergency Date Initiated: 01/27/2015 Goal Status: Active Interventions: Assess fall risk on admission and as needed Assess: immobility, friction, shearing, incontinence upon admission and as needed Assess impairment of mobility on admission and as needed per policy Assess self care needs on admission and as needed Provide education on basic hygiene Provide education on fall prevention Provide education on personal and home safety Provide education on safe transfers [name] MastersWADE, Shanah M. ([id number]) Treatment Activities: Education provided on Basic Hygiene : 04/22/2015 Notes: Orientation to  the Wound Care Program Nursing Diagnoses: Knowledge deficit related to the wound healing center program Goals: Patient/caregiver will verbalize understanding of the Wound Healing Center Program Date Initiated: 01/27/2015 Goal Status: Active Interventions: Provide education on orientation to the wound center Notes: Pressure Nursing Diagnoses: Knowledge deficit related to causes and risk factors for pressure ulcer development Knowledge deficit related to management of pressures ulcers Potential for impaired tissue integrity related to pressure, friction, moisture, and shear Goals: Patient will remain free from development of additional pressure ulcers Date Initiated: 01/27/2015 Goal Status: Active Patient will remain free of pressure ulcers Date Initiated: 01/27/2015 Goal Status: Active Patient/caregiver will verbalize risk factors for pressure ulcer development Date Initiated: 01/27/2015 Goal Status: Active Patient/caregiver will verbalize understanding of pressure ulcer management Date Initiated: 01/27/2015 Goal Status: Active Interventions: Assess: immobility, friction, shearing, incontinence upon admission and as needed Assess offloading mechanisms upon admission and as needed Assess potential for pressure ulcer upon admission and as needed Provide education on pressure ulcers [name] MastersWADE, Sondos M. ([id number]) Treatment Activities: Patient referred for pressure reduction/relief devices : 07/08/2015 Patient referred for seating evaluation to ensure proper offloading : 07/08/2015 Notes: Wound/Skin Impairment Nursing Diagnoses: Impaired tissue integrity Knowledge deficit  related to smoking impact on wound healing Knowledge deficit related to ulceration/compromised skin integrity Goals: Patient/caregiver will verbalize understanding of skin care regimen Date Initiated: 01/27/2015 Goal Status: Active Ulcer/skin breakdown will have a volume reduction of 30% by week 4 Date Initiated:  01/27/2015 Goal Status: Active Ulcer/skin breakdown will have a volume reduction of 50% by week 8 Date Initiated: 01/27/2015 Goal Status: Active Ulcer/skin breakdown will have a volume reduction of 80% by week 12 Date Initiated: 01/27/2015 Goal Status: Active Ulcer/skin breakdown will heal within 14 weeks Date Initiated: 01/27/2015 Goal Status: Active Interventions: Assess ulceration(s) every visit Provide education on ulcer and skin care Notes: Electronic Signature(s) Signed: 12/16/2015 4:48:24 PM By: Alejandro Mulling Entered By: Alejandro Mulling on 12/16/2015 14:34:25 [name], [name] ([id number]) -------------------------------------------------------------------------------- Pain Assessment Details Patient Name: [name] Date of Service: 12/16/2015 2:15 PM Medical Record Number: [id number] Patient Account Number: [id number] Date of Birth/Sex: 1965-07-20 (50 y.o. Female) Treating RN: Ashok Cordia, Debi Primary Care Physician: [place], JULIE Other Clinician: Referring Physician: [place], JULIE Treating Physician/Extender: Rudene Re in Treatment: 58 Active Problems Location of Pain Severity and Description of Pain Patient Has Paino No Site Locations Pain Management and Medication Current Pain Management: Electronic Signature(s) Signed: 12/16/2015 4:48:24 PM By: Alejandro Mulling Entered By: Alejandro Mulling on 12/16/2015 14:24:43 [name], [name] -------------------------------------------------------------------------------- Patient/Caregiver Education Details Patient Name: SHITAL, CRAYTON. Date of Service: 12/16/2015 2:15 PM Medical Record Number: [id number] Patient Account Number: [id number] Date of Birth/Gender: 03-25-1966 (50 y.o. Female) Treating RN: Ashok Cordia, Debi Primary Care Physician: [place], JULIE Other Clinician: Referring Physician: [place], JULIE Treating Physician/Extender: Rudene Re in Treatment: 57 Education  Assessment Education Provided To: Patient Education Topics Provided Wound/Skin Impairment: Handouts: Other: change dressing as ordered Methods: Demonstration, Explain/Verbal Responses: State content correctly Electronic Signature(s) Signed: 12/16/2015 4:48:24 PM By: Alejandro Mulling Entered By: Alejandro Mulling on 12/16/2015 14:36:10 Olivos, [name] ([id number]) -------------------------------------------------------------------------------- Wound Assessment Details Patient Name: [name] Date of Service: 12/16/2015 2:15 PM Medical Record Number: [id number] Patient Account Number: [id number] Date of Birth/Sex: Jan 10, 1966 (50 y.o. Female) Treating RN: Ashok Cordia, Debi Primary Care Physician: [place], JULIE Other Clinician: Referring Physician: [place], JULIE Treating Physician/Extender: Rudene Re in Treatment: 46 Wound Status Wound Number: 1 Primary Pressure Ulcer Etiology: Wound Location: Right Scapula Wound Open Wounding Event: Pressure Injury Status: Date Acquired: 09/21/2014 Comorbid Asthma, Chronic Obstructive Weeks Of Treatment: 46 History: Pulmonary Disease (COPD), Clustered Wound: No Paraplegia Photos Photo Uploaded By: Alejandro Mulling on 12/16/2015 15:59:21 Wound Measurements Length: (cm) 1.3 Width: (cm) 0.3 Depth: (cm) 0.4 Area: (cm) 0.306 Volume: (cm) 0.123 % Reduction in Area: 75% % Reduction in Volume: 87.4% Epithelialization: None Tunneling: No Undermining: No Wound Description Classification: Category/Stage IV Wound Margin: Flat and Intact Exudate Amount: Large Exudate Type: Serous Exudate Color: amber Foul Odor After Cleansing: Yes Due to Product Use: No Wound Bed Granulation Amount: Large (67-100%) Exposed Structure Granulation Quality: Red, Pink Fascia Exposed: No Necrotic Amount: Small (1-33%) Fat Layer Exposed: No Necrotic Quality: Adherent Slough Tendon Exposed: No Ancona, Talicia M. ([id number]) Muscle Exposed:  No Joint Exposed: No Bone Exposed: Yes Periwound Skin Texture Texture Color No Abnormalities Noted: No No Abnormalities Noted: No Callus: No Atrophie Blanche: No Crepitus: No Cyanosis: No Excoriation: No Ecchymosis: No Fluctuance: No Erythema: No Friable: No Hemosiderin Staining: No Induration: No Mottled: No Localized Edema: No Pallor: No Rash: No Rubor: No Scarring: No Temperature / Pain Moisture Temperature: No Abnormality No Abnormalities Noted: No Tenderness on Palpation: Yes Dry / Scaly: No  Maceration: No Moist: Yes Wound Preparation Ulcer Cleansing: Rinsed/Irrigated with Saline Topical Anesthetic Applied: Other: lidocaine 4%, Treatment Notes Wound #1 (Right Scapula) 1. Cleansed with: Clean wound with Normal Saline 2. Anesthetic Topical Lidocaine 4% cream to wound bed prior to debridement 4. Dressing Applied: Iodoform packing Gauze 5. Secondary Dressing Applied Bordered Foam Dressing Electronic Signature(s) Signed: 12/16/2015 4:48:24 PM By: Alejandro Mulling Entered By: Alejandro Mulling on 12/16/2015 14:28:55 Emanuelson, [name] ([id number]) -------------------------------------------------------------------------------- Vitals Details Patient Name: KYLEIGHA, MARKERT. Date of Service: 12/16/2015 2:15 PM Medical Record Number: [id number] Patient Account Number: [id number] Date of Birth/Sex: 07-31-1965 (50 y.o. Female) Treating RN: Ashok Cordia, Debi Primary Care Physician: [place], JULIE Other Clinician: Referring Physician: [place], JULIE Treating Physician/Extender: Rudene Re in Treatment: 41 Vital Signs Time Taken: 14:24 Temperature (F): 98.3 Height (in): 63 Pulse (bpm): 83 Weight (lbs): 100 Respiratory Rate (breaths/min): 18 Body Mass Index (BMI): 17.7 Blood Pressure (mmHg): 106/68 Reference Range: 80 - 120 mg / dl Electronic Signature(s) Signed: 12/16/2015 4:48:24 PM By: Alejandro Mulling Entered By: Alejandro Mulling on 12/16/2015  14:25:09

## 2016-01-13 ENCOUNTER — Ambulatory Visit: Payer: Medicare Other | Admitting: General Surgery

## 2016-01-20 ENCOUNTER — Ambulatory Visit: Payer: Medicare Other | Admitting: Surgery

## 2016-03-14 ENCOUNTER — Emergency Department: Payer: Medicare Other

## 2016-03-14 ENCOUNTER — Emergency Department
Admission: EM | Admit: 2016-03-14 | Discharge: 2016-03-14 | Disposition: A | Discharge status: Home / Self Care | Payer: Medicare Other | Attending: Emergency Medicine | Admitting: Emergency Medicine

## 2016-03-14 DIAGNOSIS — M542 Cervicalgia: Secondary | ICD-10-CM | POA: Diagnosis present

## 2016-03-14 DIAGNOSIS — Z79899 Other long term (current) drug therapy: Secondary | ICD-10-CM | POA: Diagnosis not present

## 2016-03-14 DIAGNOSIS — M62838 Other muscle spasm: Secondary | ICD-10-CM | POA: Diagnosis not present

## 2016-03-14 MED ORDER — DIAZEPAM 5 MG/ML IJ SOLN: 5.0000 mg | Freq: Once | INTRAMUSCULAR | Status: DC

## 2016-03-14 MED ORDER — KETOROLAC TROMETHAMINE 30 MG/ML IJ SOLN: 30.0000 mg | Freq: Once | INTRAMUSCULAR | Status: DC

## 2016-03-14 MED ORDER — DIAZEPAM 5 MG/ML IJ SOLN
INTRAMUSCULAR | Status: AC
  Administered 2016-03-14: 5 mg via INTRAMUSCULAR
  Filled 2016-03-14: qty 2

## 2016-03-14 MED ORDER — KETOROLAC TROMETHAMINE 60 MG/2ML IM SOLN
INTRAMUSCULAR | Status: AC
  Administered 2016-03-14: 60 mg via INTRAMUSCULAR
  Filled 2016-03-14: qty 2

## 2016-03-14 MED ORDER — DIAZEPAM 2 MG PO TABS: 2.0000 mg | ORAL_TABLET | Freq: Three times a day (TID) | ORAL | 0 refills | Status: AC | PRN

## 2016-03-14 MED ORDER — KETOROLAC TROMETHAMINE 60 MG/2ML IM SOLN
60.0000 mg | Freq: Once | INTRAMUSCULAR | Status: AC
  Administered 2016-03-14: 60 mg via INTRAMUSCULAR

## 2016-03-14 MED ORDER — DIAZEPAM 5 MG/ML IJ SOLN
5.0000 mg | Freq: Once | INTRAMUSCULAR | Status: AC
  Administered 2016-03-14: 5 mg via INTRAMUSCULAR

## 2016-03-14 NOTE — ED Notes (Signed)

## 2016-03-14 NOTE — ED Notes (Signed)
Pt has limited veins found for peripheral IV access. MD made aware, IJ attempted, unsuccessful. IM injections ordered.
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
## 2016-03-14 NOTE — ED Triage Notes (Addendum)
Pt arrived via ems with complaints of neck pain. Pain started Sunday and has progressively gotten worse. Pt is a paraplegic and has frequent muscle spasms. Pt states this pain is much worse that her spasms. Pt denies any radiation of pain. Pt is currently being treated for a UTI.

## 2016-06-09 ENCOUNTER — Ambulatory Visit: Payer: Medicare Other | Admitting: Surgery

## 2016-06-16 ENCOUNTER — Encounter: Payer: Medicare Other | Attending: Surgery | Admitting: Surgery

## 2016-06-16 DIAGNOSIS — J449 Chronic obstructive pulmonary disease, unspecified: Secondary | ICD-10-CM | POA: Diagnosis not present

## 2016-06-16 DIAGNOSIS — L89113 Pressure ulcer of right upper back, stage 3: Secondary | ICD-10-CM | POA: Insufficient documentation

## 2016-06-16 DIAGNOSIS — E440 Moderate protein-calorie malnutrition: Secondary | ICD-10-CM | POA: Diagnosis not present

## 2016-06-16 DIAGNOSIS — G8252 Quadriplegia, C1-C4 incomplete: Secondary | ICD-10-CM | POA: Diagnosis not present

## 2016-06-16 DIAGNOSIS — F17218 Nicotine dependence, cigarettes, with other nicotine-induced disorders: Secondary | ICD-10-CM | POA: Insufficient documentation

## 2016-06-17 NOTE — Progress Notes (Signed)
[name], Dorothie M. ([id number]) Visit Report for 06/16/2016 Abuse/Suicide Risk Screen Details Patient Name: [name], [name] 06/16/2016 9:00 Date of Service: AM Medical Record [id number] Number: Patient Account Number: [id number] [birth year] (50 y.o. Treating RN: Clover MealyAfful, RN, BSN, [place] Sinkita Date of Birth/Sex: Female) Other Clinician: Primary Care Physician: [place] Territory[place], JULIE Treating [name], [name] Referring Physician: [place], JULIE Physician/Extender: Weeks in Treatment: 0 Abuse/Suicide Risk Screen Items Answer ABUSE/SUICIDE RISK SCREEN: Has anyone close to you tried to hurt or harm you recentlyo No Do you feel uncomfortable with anyone in your familyo No Has anyone forced you do things that you didnot want to doo No Do you have any thoughts of harming yourselfo No Patient displays signs or symptoms of abuse and/or neglect. No Electronic Signature(s) Signed: 06/16/2016 1:04:27 PM By: Elpidio EricAfful, Rita BSN, RN Entered By: Elpidio EricAfful, Rita on 06/16/2016 08:54:04 Defina, [name] ([id number]) -------------------------------------------------------------------------------- Activities of Daily Living Details Patient Name: [name], [name] 06/16/2016 9:00 Date of Service: AM Medical Record [id number] Number: Patient Account Number: [id number] [birth year] (50 y.o. Treating RN: Clover MealyAfful, RN, BSN, [place] Sinkita Date of Birth/Sex: Female) Other Clinician: Primary Care Physician: [place], JULIE Treating [name], [name] Referring Physician: [place], JULIE Physician/Extender: Weeks in Treatment: 0 Activities of Daily Living Items Answer Activities of Daily Living (Please select one for each item) Drive Automobile Not Able Take Medications Need Assistance Use Telephone Need Assistance Care for Appearance Need Assistance Use Toilet Need Assistance Bath / Shower Need Assistance Dress Self Need Assistance Feed Self Need Assistance Walk Need Assistance Get In / Out Bed Need Assistance Housework Need  Assistance Prepare Meals Need Assistance Handle Money Need Assistance Shop for Self Need Assistance Electronic Signature(s) Signed: 06/16/2016 1:04:27 PM By: Elpidio EricAfful, Rita BSN, RN Entered By: Elpidio EricAfful, Rita on 06/16/2016 08:54:39 Baehr, [name] ([id number]) -------------------------------------------------------------------------------- Education Assessment Details Patient Name: [name], [name] 06/16/2016 9:00 Date of Service: AM Medical Record [id number] Number: Patient Account Number: [id number] [birth year] (50 y.o. Treating RN: Clover MealyAfful, RN, BSN, [place] Sinkita Date of Birth/Sex: Female) Other Clinician: Primary Care Physician: [place], JULIE Treating [name], [name] Referring Physician: [place], JULIE Physician/Extender: Weeks in Treatment: 0 Primary Learner Assessed: Patient Learning Preferences/Education Level/Primary Language Learning Preference: Explanation Highest Education Level: High School Preferred Language: English Cognitive Barrier Assessment/Beliefs Language Barrier: No Physical Barrier Assessment Impaired Vision: No Impaired Hearing: No Decreased Hand dexterity: No Knowledge/Comprehension Assessment Knowledge Level: High Comprehension Level: High Ability to understand written High instructions: Ability to understand verbal High instructions: Motivation Assessment Anxiety Level: Calm Cooperation: Cooperative Education Importance: Acknowledges Need Interest in Health Problems: Asks Questions Perception: Coherent Willingness to Engage in Self- High Management Activities: Readiness to Engage in Self- High Management Activities: Electronic Signature(s) Signed: 06/16/2016 1:04:27 PM By: Elpidio EricAfful, Rita BSN, RN Entered By: Elpidio EricAfful, Rita on 06/16/2016 08:55:02 Sosinski, Balinda QuailsCHARLOTTE M. ([name]) Vankleeck, Tywanna M. ([id number]) -------------------------------------------------------------------------------- Fall Risk Assessment Details Patient Name: [name], Exa M.  06/16/2016 9:00 Date of Service: AM Medical Record [id number] Number: Patient Account Number: [id number] [birth year] (50 y.o. Treating RN: Afful, RN, BSN, [place] Sinkita Date of Birth/Sex: Female) Other Clinician: Primary Care Physician: [place], JULIE Treating [name], [name] Referring Physician: [place], JULIE Physician/Extender: Weeks in Treatment: 0 Fall Risk Assessment Items Have you had 2 or more falls in the last 12 monthso 0 No Have you had any fall that resulted in injury in the last 12 monthso 0 No FALL RISK ASSESSMENT: History of falling - immediate or within 3 months 0 No Secondary diagnosis 0 No Ambulatory aid None/bed rest/wheelchair/nurse 0 Yes Crutches/cane/walker 0 No Furniture 0 No IV  Access/Saline Lock 0 No Gait/Training Normal/bed rest/immobile 0 Yes Weak 10 Yes Impaired 20 Yes Mental Status Oriented to own ability 0 Yes Electronic Signature(s) Signed: 06/16/2016 1:04:27 PM By: Elpidio EricAfful, Rita BSN, RN Entered By: Elpidio EricAfful, Rita on 06/16/2016 08:55:20 Bodin, [name] ([id number]) -------------------------------------------------------------------------------- Foot Assessment Details Patient Name: [name], [name] 06/16/2016 9:00 Date of Service: AM Medical Record [id number] Number: Patient Account Number: [id number] [birth year] (50 y.o. Treating RN: Clover MealyAfful, RN, BSN, [place] Sinkita Date of Birth/Sex: Female) Other Clinician: Primary Care Physician: [place], JULIE Treating [name], [name] Referring Physician: [place], JULIE Physician/Extender: Weeks in Treatment: 0 Foot Assessment Items Site Locations + = Sensation present, - = Sensation absent, C = Callus, U = Ulcer R = Redness, W = Warmth, M = Maceration, PU = Pre-ulcerative lesion F = Fissure, S = Swelling, D = Dryness Assessment Right: Left: Other Deformity: No No Prior Foot Ulcer: No No Prior Amputation: No No Charcot Joint: No No Ambulatory Status: Non-ambulatory Assistance Device: Wheelchair Gait:  Surveyor, miningUnsteady Electronic Signature(s) Signed: 06/16/2016 1:04:27 PM By: Elpidio EricAfful, Rita BSN, RN Entered By: Elpidio EricAfful, Rita on 06/16/2016 08:55:34 Spong, [name] ([id number]) Dorsch, [name] ([id number]) -------------------------------------------------------------------------------- Nutrition Risk Assessment Details Patient Name: [name], Averill M. 06/16/2016 9:00 Date of Service: AM Medical Record [id number] Number: Patient Account Number: [id number] [birth year] (50 y.o. Treating RN: Clover MealyAfful, RN, BSN, [place] Sinkita Date of Birth/Sex: Female) Other Clinician: Primary Care Physician: [place], JULIE Treating [name], [name] Referring Physician: [place], JULIE Physician/Extender: Weeks in Treatment: 0 Height (in): Weight (lbs): Body Mass Index (BMI): Nutrition Risk Assessment Items NUTRITION RISK SCREEN: I have an illness or condition that made me change the kind and/or 0 No amount of food I eat I eat fewer than two meals per day 0 No I eat few fruits and vegetables, or milk products 0 No I have three or more drinks of beer, liquor or wine almost every day 0 No I have tooth or mouth problems that make it hard for me to eat 0 No I don't always have enough money to buy the food I need 0 No I eat alone most of the time 0 No I take three or more different prescribed or over-the-counter drugs a 0 No day Without wanting to, I have lost or gained 10 pounds in the last six 0 No months I am not always physically able to shop, cook and/or feed myself 0 No Nutrition Protocols Good Risk Protocol 0 No interventions needed Moderate Risk Protocol Electronic Signature(s) Signed: 06/16/2016 1:04:27 PM By: Elpidio EricAfful, Rita BSN, RN Entered By: Elpidio EricAfful, Rita on 06/16/2016 08:55:26

## 2016-06-17 NOTE — Progress Notes (Signed)
[name] ([id number]) Visit Report for 06/16/2016 Allergy List Details Patient Name: [name], [name] Date of Service: 06/16/2016 9:00 AM Medical Record Number: [id number] Patient Account Number: [id number] Date of Birth/Sex: April 08, 1966 (50 y.o. Female) Treating RN: Afful, RN, BSN, [place] Sink Primary Care Physician: [place], JULIE Other Clinician: Referring Physician: [place], JULIE Treating Physician/Extender: Rudene Re in Treatment: 0 Allergies Active Allergies Shellfish Containing Products Allergy Notes Electronic Signature(s) Signed: 06/16/2016 1:04:27 PM By: Elpidio Eric BSN, RN Entered By: Elpidio Eric on 06/16/2016 08:51:32 [name], [name] -------------------------------------------------------------------------------- Arrival Information Details Patient Name: [name], [name] Date of Service: 06/16/2016 9:00 AM Medical Record Number: [id number] Patient Account Number: [id number] Date of Birth/Sex: 07/27/1965 (50 y.o. Female) Treating RN: Afful, RN, BSN, [place] Sink Primary Care Physician: [place], JULIE Other Clinician: Referring Physician: [place], JULIE Treating Physician/Extender: Rudene Re in Treatment: 0 Visit Information Patient Arrived: Wheel Chair Arrival Time: 08:50 Accompanied By: friend Transfer Assistance: EasyPivot Patient Lift Patient Identification Verified: Yes Secondary Verification Process Yes Completed: Patient Requires Transmission- No Based Precautions: Patient Has Alerts: No History Since Last Visit All ordered tests and consults were completed: No Added or deleted any medications: No Any new allergies or adverse reactions: No Had a fall or experienced change in activities of daily living that may affect risk of falls: No Signs or symptoms of abuse/neglect since last visito No Hospitalized since last visit: No Has Dressing in Place as Prescribed: No Pain Present Now: No Electronic Signature(s) Signed:  06/16/2016 1:04:27 PM By: Elpidio Eric BSN, RN Entered By: Elpidio Eric on 06/16/2016 08:50:49 [name], [name] ([id number]) -------------------------------------------------------------------------------- Clinic Level of Care Assessment Details Patient Name: [name] Date of Service: 06/16/2016 9:00 AM Medical Record Number: [id number] Patient Account Number: [id number] Date of Birth/Sex: 06/03/1966 (50 y.o. Female) Treating RN: Afful, RN, BSN, [place] Sink Primary Care Physician: [place], JULIE Other Clinician: Referring Physician: [place], JULIE Treating Physician/Extender: Rudene Re in Treatment: 0 Clinic Level of Care Assessment Items TOOL 2 Quantity Score []  - Use when only an EandM is performed on the INITIAL visit 0 ASSESSMENTS - Nursing Assessment / Reassessment X - General Physical Exam (combine w/ comprehensive assessment (listed just 1 20 below) when performed on new pt. evals) X - Comprehensive Assessment (HX, ROS, Risk Assessments, Wounds Hx, etc.) 1 25 ASSESSMENTS - Wound and Skin Assessment / Reassessment X - Simple Wound Assessment / Reassessment - one wound 1 5 []  - Complex Wound Assessment / Reassessment - multiple wounds 0 []  - Dermatologic / Skin Assessment (not related to wound area) 0 ASSESSMENTS - Ostomy and/or Continence Assessment and Care []  - Incontinence Assessment and Management 0 []  - Ostomy Care Assessment and Management (repouching, etc.) 0 PROCESS - Coordination of Care X - Simple Patient / Family Education for ongoing care 1 15 []  - Complex (extensive) Patient / Family Education for ongoing care 0 X - Staff obtains Chiropractor, Records, Test Results / Process Orders 1 10 []  - Staff telephones HHA, Nursing Homes / Clarify orders / etc 0 []  - Routine Transfer to another Facility (non-emergent condition) 0 []  - Routine Hospital Admission (non-emergent condition) 0 X - New Admissions / Manufacturing engineer / Ordering NPWT, Apligraf, etc. 1  15 []  - Emergency Hospital Admission (emergent condition) 0 []  - Simple Discharge Coordination 0 [name], [name] ([id number]) []  - Complex (extensive) Discharge Coordination 0 PROCESS - Special Needs []  - Pediatric / Minor Patient Management 0 []  - Isolation Patient Management 0 []  - Hearing / Language /  Visual special needs 0 []  - Assessment of Community assistance (transportation, D/C planning, etc.) 0 []  - Additional assistance / Altered mentation 0 []  - Support Surface(s) Assessment (bed, cushion, seat, etc.) 0 INTERVENTIONS - Wound Cleansing / Measurement X - Wound Imaging (photographs - any number of wounds) 1 5 []  - Wound Tracing (instead of photographs) 0 X - Simple Wound Measurement - one wound 1 5 []  - Complex Wound Measurement - multiple wounds 0 X - Simple Wound Cleansing - one wound 1 5 []  - Complex Wound Cleansing - multiple wounds 0 INTERVENTIONS - Wound Dressings X - Small Wound Dressing one or multiple wounds 1 10 []  - Medium Wound Dressing one or multiple wounds 0 []  - Large Wound Dressing one or multiple wounds 0 []  - Application of Medications - injection 0 INTERVENTIONS - Miscellaneous []  - External ear exam 0 []  - Specimen Collection (cultures, biopsies, blood, body fluids, etc.) 0 []  - Specimen(s) / Culture(s) sent or taken to Lab for analysis 0 []  - Patient Transfer (multiple staff / Nurse, adult / Similar devices) 0 []  - Simple Staple / Suture removal (25 or less) 0 []  - Complex Staple / Suture removal (26 or more) 0 [name], [name] ([id number]) []  - Hypo / Hyperglycemic Management (close monitor of Blood Glucose) 0 []  - Ankle / Brachial Index (ABI) - do not check if billed separately 0 Has the patient been seen at the hospital within the last three years: Yes Total Score: 115 Level Of Care: New/Established - Level 3 Electronic Signature(s) Signed: 06/16/2016 1:04:27 PM By: Elpidio Eric BSN, RN Entered By: Elpidio Eric on 06/16/2016 09:53:57 [name],  [name] ([id number]) -------------------------------------------------------------------------------- Encounter Discharge Information Details Patient Name: ALEEAH, GREENO. Date of Service: 06/16/2016 9:00 AM Medical Record Number: [id number] Patient Account Number: [id number] Date of Birth/Sex: [birth year] (50 y.o. Female) Treating RN: Afful, RN, BSN, [place] Sink Primary Care Physician: [place], JULIE Other Clinician: Referring Physician: [place], JULIE Treating Physician/Extender: Rudene Re in Treatment: 0 Encounter Discharge Information Items Discharge Pain Level: 0 Discharge Condition: Stable Ambulatory Status: Wheelchair Discharge Destination: Home Transportation: Other Accompanied By: friend Schedule Follow-up Appointment: No Medication Reconciliation completed and provided to Patient/Care No Dia Jefferys: Provided on Clinical Summary of Care: 06/16/2016 Form Type Recipient Paper Patient CW Electronic Signature(s) Signed: 06/16/2016 9:55:00 AM By: Elpidio Eric BSN, RN Previous Signature: 06/16/2016 9:26:10 AM Version By: Gwenlyn Perking Entered By: Elpidio Eric on 06/16/2016 09:55:00 Krysiak, [name] ([id number]) -------------------------------------------------------------------------------- Lower Extremity Assessment Details Patient Name: ALYSS, GRANATO. Date of Service: 06/16/2016 9:00 AM Medical Record Number: [id number] Patient Account Number: [id number] Date of Birth/Sex: 06/22/1966 (50 y.o. Female) Treating RN: Afful, RN, BSN, [place] Sink Primary Care Physician: [place], JULIE Other Clinician: Referring Physician: [place], JULIE Treating Physician/Extender: Rudene Re in Treatment: 0 Electronic Signature(s) Signed: 06/16/2016 1:04:27 PM By: Elpidio Eric BSN, RN Entered By: Elpidio Eric on 06/16/2016 08:51:27 [place], [name] ([id number]) -------------------------------------------------------------------------------- Multi Wound Chart Details Patient  Name: AMIKA, TASSIN. Date of Service: 06/16/2016 9:00 AM Medical Record Number: [id number] Patient Account Number: [id number] Date of Birth/Sex: 1965/08/30 (50 y.o. 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] [N/A:N/A] Comorbid History: [3:Asthma, Chronic Obstructive Pulmonary  Disease (COPD), History of pressure wounds, Paraplegia] [N/A:N/A] Date Acquired: [3:01/12/2016] [N/A:N/A] Weeks of Treatment: [3:0] [N/A:N/A] Wound Status: [3:Open] [N/A:N/A] Measurements L x W x D 0.8x0.5x0.5 [N/A:N/A] (cm) Area (cm) : [3:0.314] [N/A:N/A] Volume (cm) : [3:0.157] [N/A:N/A] % Reduction in Area: [3:0.00%] [N/A:N/A] % Reduction in Volume: 0.00% [N/A:N/A] Starting Position 1 2 (o'clock): Ending Position 1 [3:6] (o'clock): Maximum Distance 1 1.5 (cm): Undermining: [3:Yes] [N/A:N/A] Classification: [3:Category/Stage II] [N/A:N/A] Exudate Amount: [3:Medium] [N/A:N/A] Exudate Type: [3:Serosanguineous] [N/A:N/A] Exudate Color: [3:red, brown] [N/A:N/A] Wound Margin: [3:Distinct, outline attached] [N/A:N/A] Granulation Amount: Large (67-100%) N/A N/A Granulation Quality: Pink, Pale N/A N/A Necrotic Amount: None Present (0%) N/A N/A Exposed Structures: Fascia: No N/A N/A Fat: No Tendon: No Muscle: No Joint: No Bone: No Limited to Skin Breakdown Epithelialization: None N/A N/A Periwound Skin Texture: Edema: No N/A N/A Excoriation: No Induration: No Callus: No Crepitus: No Fluctuance: No Friable: No Rash:  No Scarring: No Periwound Skin Moist: Yes N/A N/A Moisture: Maceration: No Dry/Scaly: No Periwound Skin Color: Atrophie Blanche: No N/A N/A Cyanosis: No Ecchymosis: No Erythema: No Hemosiderin Staining: No Mottled: No Pallor: No Rubor: No Temperature: No Abnormality N/A N/A Tenderness on No N/A N/A Palpation: Wound Preparation: Ulcer Cleansing: N/A N/A Rinsed/Irrigated with Saline Topical Anesthetic Applied: Other: lidocaine 4% Treatment Notes Electronic Signature(s) Signed: 06/16/2016 1:04:27 PM By: Elpidio Eric BSN, RN Entered By: Elpidio Eric on 06/16/2016 09:11:07 [name], [name] ([id number]) [name], [name] ([id number]) -------------------------------------------------------------------------------- Multi-Disciplinary Care Plan Details Patient Name: [name], [name] Date of Service: 06/16/2016 9:00 AM Medical Record Number: [id number] Patient Account Number: [id number] Date of Birth/Sex: 05-Jun-1966 (50 y.o. Female) Treating RN: Afful, RN, BSN, Rita Primary Care Physician: [place], JULIE Other Clinician: Referring Physician: [place], JULIE Treating Physician/Extender: Rudene Re in Treatment: 0 Active Inactive Orientation to the Wound Care Program Nursing Diagnoses: Knowledge deficit related to the wound healing center program Goals: Patient/caregiver will verbalize understanding of the Wound Healing Center Program Date Initiated: 06/16/2016 Goal Status: Active Interventions: Provide education on orientation to the wound center Notes: Pressure Nursing Diagnoses: Knowledge deficit related to causes and risk factors for pressure ulcer development Knowledge deficit related to management of pressures ulcers Potential for impaired tissue integrity related to pressure, friction, moisture, and shear Goals: Patient will remain free from development of additional pressure ulcers Date Initiated: 06/16/2016 Goal Status: Active Patient will remain free  of pressure ulcers Date Initiated: 06/16/2016 Goal Status: Active Patient/caregiver will verbalize risk factors for pressure ulcer development Date Initiated: 06/16/2016 Goal Status: Active Patient/caregiver will verbalize understanding of pressure ulcer management Date Initiated: 06/16/2016 Goal Status: Active Interventions: [name], [name] ([id number]) Assess: immobility, friction, shearing, incontinence upon admission and as needed Assess offloading mechanisms upon admission and as needed Assess potential for pressure ulcer upon admission and as needed Provide education on pressure ulcers Treatment Activities: Patient referred for pressure reduction/relief devices : 06/16/2016 Patient referred for seating evaluation to ensure proper offloading : 06/16/2016 Pressure reduction/relief device ordered : 06/16/2016 Notes: Wound/Skin Impairment Nursing Diagnoses: Impaired tissue integrity Knowledge deficit related to smoking impact on wound healing Knowledge deficit related to ulceration/compromised skin integrity Goals: Patient will demonstrate a reduced rate of smoking or cessation of smoking Date Initiated: 06/16/2016 Goal Status: Active Patient/caregiver will verbalize understanding of skin care regimen Date Initiated: 06/16/2016 Goal Status: Active Ulcer/skin breakdown will have a volume reduction of 30% by week 4 Date Initiated: 06/16/2016 Goal Status: Active Ulcer/skin breakdown will have a volume reduction of 50% by week 8 Date Initiated: 06/16/2016  Goal Status: Active Ulcer/skin breakdown will have a volume reduction of 80% by week 12 Date Initiated: 06/16/2016 Goal Status: Active Ulcer/skin breakdown will heal within 14 weeks Date Initiated: 06/16/2016 Goal Status: Active Interventions: Assess patient/caregiver ability to obtain necessary supplies Assess patient/caregiver ability to perform ulcer/skin care regimen upon admission and as needed Assess  ulceration(s) every visit Provide education on smoking Provide education on ulcer and skin care [name] MastersWADE, [name] ([id number]) Treatment Activities: Referred to DME Johnell Landowski for dressing supplies : 06/16/2016 Skin care regimen initiated : 06/16/2016 Topical wound management initiated : 06/16/2016 Notes: Electronic Signature(s) Signed: 06/16/2016 1:04:27 PM By: Elpidio EricAfful, Rita BSN, RN Entered By: Elpidio EricAfful, Rita on 06/16/2016 09:10:53 Mcdanel, [name] ([id number]) -------------------------------------------------------------------------------- Pain Assessment Details Patient Name: [name] MastersWADE, Jala M. Date of Service: 06/16/2016 9:00 AM Medical Record Number: [id number] Patient Account Number: [id number] Date of Birth/Sex: 1966-03-31 (50 y.o. Female) Treating RN: Afful, RN, BSN, [place] Sinkita Primary Care Physician: [place], JULIE Other Clinician: Referring Physician: [place], JULIE Treating Physician/Extender: Rudene ReBritto, [name] Weeks in Treatment: 0 Active Problems Location of Pain Severity and Description of Pain Patient Has Paino No Site Locations With Dressing Change: No Pain Management and Medication Current Pain Management: Electronic Signature(s) Signed: 06/16/2016 1:04:27 PM By: Elpidio EricAfful, Rita BSN, RN Entered By: Elpidio EricAfful, Rita on 06/16/2016 08:50:58 [place], [name] ([id number]) -------------------------------------------------------------------------------- Patient/Caregiver Education Details Patient Name: [name] MastersWADE, [name] Date of Service: 06/16/2016 9:00 AM Medical Record Number: [id number] Patient Account Number: [id number] Date of Birth/Gender: 1966-03-31 (50 y.o. Female) Treating RN: Afful, RN, BSN, [place] Sinkita Primary Care Physician: [place], JULIE Other Clinician: Referring Physician: [place], JULIE Treating Physician/Extender: Rudene ReBritto, [name] Weeks in Treatment: 0 Education Assessment Education Provided To: Patient Education Topics Provided Pressure: Methods: Explain/Verbal Responses:  State content correctly Smoking and Wound Healing: Methods: Explain/Verbal Responses: State content correctly Welcome To The Wound Care Center: Methods: Explain/Verbal Responses: State content correctly Wound/Skin Impairment: Methods: Explain/Verbal Responses: State content correctly Electronic Signature(s) Signed: 06/16/2016 1:04:27 PM By: Elpidio EricAfful, Rita BSN, RN Entered By: Elpidio EricAfful, Rita on 06/16/2016 09:55:20 [place], [name] ([id number]) -------------------------------------------------------------------------------- Wound Assessment Details Patient Name: [name] MastersWADE, [name] Date of Service: 06/16/2016 9:00 AM Medical Record Number: [id number] Patient Account Number: [id number] Date of Birth/Sex: 1966-03-31 (50 y.o. Female) Treating RN: Afful, RN, BSN, Psychologist, clinicalita Primary Care Physician: [place], JULIE Other Clinician: Referring Physician: [place], JULIE Treating Physician/Extender: Rudene ReBritto, [name] Weeks in Treatment: 0 Wound Status Wound Number: 3 Primary Pressure Ulcer Etiology: Wound Location: Right Back Wound Open Wounding Event: Pressure Injury Status: Date Acquired: 01/12/2016 Comorbid Asthma, Chronic Obstructive Weeks Of Treatment: 0 History: Pulmonary Disease (COPD), History of Clustered Wound: No pressure wounds, Paraplegia Photos Photo Uploaded By: Elpidio EricAfful, Rita on 06/16/2016 15:48:43 Wound Measurements Length: (cm) 0.8 Width: (cm) 0.5 Depth: (cm) 0.5 Area: (cm) 0.314 Volume: (cm) 0.157 % Reduction in Area: 0% % Reduction in Volume: 0% Epithelialization: None Tunneling: No Undermining: Yes Starting Position (o'clock): 2 Ending Position (o'clock): 6 Maximum Distance: (cm) 1.5 Wound Description Classification: Category/Stage II Wound Margin: Distinct, outline attached Exudate Amount: Medium Exudate Type: Serosanguineous Exudate Color: red, brown Foul Odor After Cleansing: No Wound Bed Gallick, [name] ([id number]) Granulation Amount: Large (67-100%) Exposed  Structure Granulation Quality: Pink, Pale Fascia Exposed: No Necrotic Amount: None Present (0%) Fat Layer Exposed: No Tendon Exposed: No Muscle Exposed: No Joint Exposed: No Bone Exposed: No Limited to Skin Breakdown Periwound Skin Texture Texture Color No Abnormalities Noted: No No Abnormalities Noted: No Callus: No Atrophie Blanche: No Crepitus: No Cyanosis: No Excoriation: No Ecchymosis: No Fluctuance: No Erythema:  No Friable: No Hemosiderin Staining: No Induration: No Mottled: No Localized Edema: No Pallor: No Rash: No Rubor: No Scarring: No Temperature / Pain Moisture Temperature: No Abnormality No Abnormalities Noted: No Dry / Scaly: No Maceration: No Moist: Yes Wound Preparation Ulcer Cleansing: Rinsed/Irrigated with Saline Topical Anesthetic Applied: Other: lidocaine 4%, Treatment Notes Wound #3 (Right Back) 1. Cleansed with: Clean wound with Normal Saline 4. Dressing Applied: Iodoform packing Gauze 5. Secondary Dressing Applied Bordered Foam Dressing Electronic Signature(s) Signed: 06/16/2016 1:04:27 PM By: Elpidio EricAfful, Rita BSN, RN Entered By: Elpidio EricAfful, Rita on 06/16/2016 09:10:00 Kuntzman, [name] ([id number]) -------------------------------------------------------------------------------- Vitals Details Patient Name: [name] MastersWADE, [name] Date of Service: 06/16/2016 9:00 AM Medical Record Number: [id number] Patient Account Number: [id number] Date of Birth/Sex: [birth year] (50 y.o. Female) Treating RN: Afful, RN, BSN, [place] Sinkita Primary Care Physician: [place], JULIE Other Clinician: Referring Physician: [place], JULIE Treating Physician/Extender: Rudene ReBritto, [name] Weeks in Treatment: 0 Vital Signs Time Taken: 08:51 Temperature (F): 97.5 Pulse (bpm): 70 Respiratory Rate (breaths/min): 17 Blood Pressure (mmHg): 96/55 Reference Range: 80 - 120 mg / dl Electronic Signature(s) Signed: 06/16/2016 1:04:27 PM By: Elpidio EricAfful, Rita BSN, RN Entered By: Elpidio EricAfful, Rita on  06/16/2016 08:51:21

## 2016-06-17 NOTE — Progress Notes (Addendum)
[name], Levenia M. ([id number]) Visit Report for 06/16/2016 Chief Complaint Document Details Patient Name: [name], [name] 06/16/2016 9:00 Date of Service: AM Medical Record [id number] Number: Patient Account Number: [id number] 09-05-1965 (50 y.o. Treating RN: Clover MealyAfful, RN, BSN, [place] Sinkita Date of Birth/Sex: Female) Other Clinician: Primary Care Physician: [place], JULIE Treating Dalya Maselli Referring Physician: [place], JULIE Physician/Extender: Weeks in Treatment: 0 Information Obtained from: Patient Chief Complaint Patient presents to the wound care center for a consult due non healing wound. 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 She comes in for a pressure injury to the right scapular region itches had for several months and we have seen her until 6 months ago Electronic Signature(s) Signed: 06/16/2016 9:29:18 AM By: Evlyn KannerBritto, Trenda Corliss MD, FACS Entered By: Evlyn KannerBritto, Chinedum Vanhouten on 06/16/2016 09:29:18 Schult, [name] ([id number]) -------------------------------------------------------------------------------- HPI Details Patient Name: [name], [name] 06/16/2016 9:00 Date of Service: AM Medical Record [id number] Number: Patient Account Number: [id number] 09-05-1965 (50 y.o. Treating RN: Clover MealyAfful, RN, BSN, [place] Sinkita Date of Birth/Sex: Female) Other Clinician: Primary Care Physician: [place], JULIE Treating Iyesha Such Referring Physician: [place], JULIE Physician/Extender: Weeks in Treatment: 0 History of Present Illness Location: right scapular region Quality: Patient reports No Pain. Severity: Patient states wound (s) are getting better. Duration: Patient has had the wound for > 12 months prior to seeking treatment at the wound center Context: The wound would happen gradually Modifying Factors: Other treatment(s) tried include:been in hospital sick with a pneumonia and has had problems with transportation Associated Signs  and Symptoms: Patient reports having:healed the wound on her sacral region HPI Description: 50 year old patient was had quadriparesis for the last 9 years and [facility] [place]. For about 4-5 months she's had a decubitus ulcer on the right scapular region. she was lost to follow-up for the last 6 months. she had a couple of small areas which have opened out in the region of previous scar tissue in the sacral region but these haveall healed now. She does not have any other significant comorbidities. She has a good Roho cushion for her wheelchair and she has a specialized mattress for sleep. She has restarted smoking and smokes about half a pack of cigarettes a day Old notes 02/18/2015 -- X-ray of the pelvis -- IMPRESSION:No acute osseous injury of the pelvis. If there is clinical concern regarding osteomyelitis, recommend further evaluation with CT. X-ray of the sacrum and coccyx IMPRESSION:No acute osseous abnormality of the sacrum. If there is further clinical concern regarding osteomyelitis of the sacrum, further evaluation with CT is recommended. Electronic Signature(s) Signed: 06/16/2016 9:32:21 AM By: Evlyn KannerBritto, Viren Lebeau MD, FACS Entered By: Evlyn KannerBritto, Jacorie Ernsberger on 06/16/2016 09:32:21 [name], Fritzi M. ([id number]) -------------------------------------------------------------------------------- Physical Exam Details Patient Name: [name], [name] 06/16/2016 9:00 Date of Service: AM Medical Record [id number] Number: Patient Account Number: [id number] 09-05-1965 (50 y.o. Treating RN: Clover MealyAfful, RN, BSN, [place] Sinkita Date of Birth/Sex: Female) Other Clinician: Primary Care Physician: [place], JULIE Treating Elpidia Karn Referring Physician: [place], JULIE Physician/Extender: Weeks in Treatment: 0 Constitutional . Pulse regular. Respirations normal and unlabored. Afebrile. . Eyes Nonicteric. Reactive to light. Ears, Nose, Mouth, and  Throat Lips, teeth, and gums WNL.Marland Kitchen. Moist mucosa without lesions. Neck supple and nontender. No palpable supraclavicular or cervical adenopathy. Normal sized without goiter. Respiratory WNL. No retractions.. Cardiovascular Pedal Pulses WNL. No clubbing, cyanosis or edema. Chest Breasts symmetical and no nipple discharge.. Breast tissue WNL, no masses, lumps, or  tenderness.. Gastrointestinal (GI) Abdomen without masses or tenderness.. No liver or spleen enlargement or tenderness.. Lymphatic No adneopathy. No adenopathy. No adenopathy. Musculoskeletal Adexa without tenderness or enlargement.. Digits and nails w/o clubbing, cyanosis, infection, petechiae, ischemia, or inflammatory conditions.. Integumentary (Hair, Skin) No suspicious lesions. No crepitus or fluctuance. No peri-wound warmth or erythema. No masses.Marland Kitchen Psychiatric Judgement and insight Intact.. No evidence of depression, anxiety, or agitation.. Notes the sacral region is completely healed and there are no open ulcerations. The area on her right scapular region has a small external opening but undermines about 1.5 cm between the 2 and 6:00 position. The undermining between the 6 and 12:00 position is about 4 mm. There is no surrounding cellulitis Electronic Signature(s) Signed: 06/16/2016 9:33:07 AM By: Evlyn Kanner MD, FACS [name], [name] ([id number]) Entered By: Evlyn Kanner on 06/16/2016 09:33:07 [name] ([id number]) -------------------------------------------------------------------------------- Physician Orders Details Patient Name: [name], [name] 06/16/2016 9:00 Date of Service: AM Medical Record [id number] Number: Patient Account Number: [id number] 12/07/1965 (50 y.o. Treating RN: Clover Mealy, RN, BSN, [place] Sink Date of Birth/Sex: Female) Other Clinician: Primary Care Physician: [place], JULIE Treating Mihailo Sage Referring Physician: [place], JULIE Physician/Extender: Tania Ade in Treatment: 0 Verbal /  Phone Orders: Yes Clinician: Afful, RN, BSN, Rita Read Back and Verified: Yes Diagnosis Coding Wound Cleansing Wound #3 Right Back o Clean wound with Normal Saline. Skin Barriers/Peri-Wound Care Wound #3 Right Back o Skin Prep Primary Wound Dressing Wound #3 Right Back o Iodoform packing Gauze - 1/4 inch Secondary Dressing Wound #3 Right Back o Dry Gauze o Other - Telfa island Dressing Change Frequency Wound #3 Right Back o Change dressing every day. Follow-up Appointments Wound #3 Right Back o Return Appointment in: - 3 weeks due to transportation Off-Loading Wound #3 Right Back o Mattress o Turn and reposition every 2 hours Additional Orders / Instructions Wound #3 Right Back o Stop Smoking [name], [name] ([id number]) o Increase protein intake. o Other: - VIt C, VIT A, ZINC, MVI Home Health Wound #3 Right Back o Continue Home Health Visits - [facility] o Home Health Nurse may visit PRN to address patientos wound care needs. o FACE TO FACE ENCOUNTER: MEDICARE and MEDICAID PATIENTS: I certify that this patient is under my care and that I had a face-to-face encounter that meets the physician face-to-face encounter requirements with this patient on this date. The encounter with the patient was in whole or in part for the following MEDICAL CONDITION: (primary reason for Home Healthcare) MEDICAL NECESSITY: I certify, that based on my findings, NURSING services are a medically necessary home health service. HOME BOUND STATUS: I certify that my clinical findings support that this patient is homebound (i.e., Due to illness or injury, pt requires aid of supportive devices such as crutches, cane, wheelchairs, walkers, the use of special transportation or the assistance of another person to leave their place of residence. There is a normal inability to leave the home and doing so requires considerable and taxing effort. Other absences are for  medical reasons / religious services and are infrequent or of short duration when for other reasons). o If current dressing causes regression in wound condition, may D/C ordered dressing product/s and apply Normal Saline Moist Dressing daily until next Wound Healing Center / Other MD appointment. Notify Wound Healing Center of regression in wound condition at [phone]. o Please direct any NON-WOUND related issues/requests for orders to patient's Primary Care Physician Electronic Signature(s) Signed: 06/16/2016 3:20:44 PM By: Elpidio Eric BSN, RN Signed: 06/16/2016  3:52:57 PM By: Evlyn Kanner MD, FACS Previous Signature: 06/16/2016 1:04:27 PM Version By: Elpidio Eric BSN, RN Entered By: Elpidio Eric on 06/16/2016 15:20:43 [name], [name] ([id number]) -------------------------------------------------------------------------------- Problem List Details Patient Name: [name], [name] 06/16/2016 9:00 Date of Service: AM Medical Record [id number] Number: Patient Account Number: [id number] 07-01-1966 (50 y.o. 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 06/16/2016 9:00 Date of Service: AM Medical Record [id number] Number: Patient Account Number: [id number] 1966/03/26 (50 y.o. Treating RN: Clover Mealy, RN, BSN, [place] Sink Date of Birth/Sex: Female) Other Clinician: Primary Care Physician: [place], JULIE Treating Evlyn Kanner Referring Physician: [place], JULIE Physician/Extender: Weeks in Treatment: 0 Subjective Chief Complaint Information obtained from Patient Patient presents to the wound care center for a consult due non healing wound. 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 She comes in for a pressure injury to the right scapular region itches had for several months and we have seen her until 6 months ago History of Present Illness (HPI) The following HPI elements were documented for the patient's wound: Location: right scapular region Quality: Patient reports No Pain. Severity: Patient states wound (s) are getting better. 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 For about 4-5 months she's had a decubitus ulcer on the right scapular region. she was lost to follow-up for the last 6 months. she had a couple of small areas which have opened out in the region of previous scar tissue in the sacral region but these haveall healed now. She does not have any other significant comorbidities. She has a good Roho cushion for her wheelchair and she has a specialized mattress for sleep. She has restarted  smoking and smokes about half a pack of cigarettes a day Old notes 02/18/2015 -- X-ray of the pelvis -- IMPRESSION:No acute osseous injury of the pelvis. If there is clinical concern regarding osteomyelitis, recommend further evaluation with CT. X-ray of the sacrum and coccyx IMPRESSION:No acute osseous abnormality of the sacrum. If there is further clinical concern regarding osteomyelitis of the sacrum, further evaluation with CT is recommended. Wound History Patient presents with 1 open wound that has been present for approximately months. Patient has been KRISTIE, BRACEWELL. ([id number]) treating wound in the following manner: dry dressing. Laboratory tests have not been performed in the last month. Patient reportedly has not tested positive for an antibiotic resistant organism. Patient reportedly has not tested positive for osteomyelitis. Patient reportedly has not had testing performed to evaluate  circulation in the legs. Patient History Information obtained from Patient. Allergies Shellfish Containing Products Family History Cancer - Mother, Diabetes - Siblings, Hypertension - Siblings, No family history of Heart Disease, Hereditary Spherocytosis, Kidney Disease, Lung Disease, Seizures, Stroke, Thyroid Problems, Tuberculosis. Social History Current every day smoker, Marital Status - Separated, Alcohol Use - Never, Drug Use - No History, Caffeine Use - Never. Medical History Eyes Denies history of Cataracts, Glaucoma, Optic Neuritis Ear/Nose/Mouth/Throat Denies history of Chronic sinus problems/congestion, Middle ear problems Hematologic/Lymphatic Denies history of Anemia, Hemophilia, Human Immunodeficiency Virus, Lymphedema, Sickle Cell Disease Cardiovascular Denies history of Angina, Arrhythmia, Congestive Heart Failure, Coronary Artery Disease, Deep Vein Thrombosis, Hypertension, Hypotension, Myocardial Infarction, Peripheral Arterial Disease, Peripheral Venous Disease,  Phlebitis, Vasculitis Gastrointestinal Denies history of Cirrhosis , Colitis, Crohn s, Hepatitis A, Hepatitis B, Hepatitis C Endocrine Denies history of Type I Diabetes, Type II Diabetes Genitourinary Denies history of End Stage Renal Disease Immunological Denies history of Lupus Erythematosus, Raynaud s, Scleroderma Integumentary (Skin) Patient has history of History of pressure wounds Denies history of History of Burn Musculoskeletal Denies history of Gout, Rheumatoid Arthritis, Osteoarthritis, Osteomyelitis Neurologic Patient has history of Paraplegia - C 3 paraplegia Oncologic Denies history of Received Chemotherapy, Received Radiation Hospitalization/Surgery History - 05/13/2014, [facility], collapsed lung and lung congestion. BENTLEE, DRIER ([id number]) Medical And Surgical History Notes Genitourinary urostomy Musculoskeletal right arm contracture Review of Systems (ROS) Constitutional Symptoms (General Health) The patient has no complaints or symptoms. Eyes The patient has no complaints or symptoms. Ear/Nose/Mouth/Throat The patient has no complaints or symptoms. Hematologic/Lymphatic The patient has no complaints or symptoms. Respiratory The patient has no complaints or symptoms. Cardiovascular The patient has no complaints or symptoms. Gastrointestinal The patient has no complaints or symptoms. Endocrine The patient has no complaints or symptoms. Genitourinary The patient has no complaints or symptoms. Immunological The patient has no complaints or symptoms. Integumentary (Skin) Complains or has symptoms of Wounds. Musculoskeletal The patient has no complaints or symptoms. Neurologic The patient has no complaints or symptoms. Oncologic The patient has no complaints or symptoms. Medications sulfamethoxazole 800 mg-trimethoprim 160 mg tablet oral tablet oral tramadol 50 mg tablet oral tablet oral diazepam 2 mg tablet oral tablet  oral brompheniramine-pseudoephedrine-DM 2 mg-30 mg-10 mg/5 mL syrup oral syrup oral Objective Ranieri, Lynsee M. ([id number]) Constitutional Pulse regular. Respirations normal and unlabored. Afebrile. Vitals Time Taken: 8:51 AM, Temperature: 97.5 F, Pulse: 70 bpm, Respiratory Rate: 17 breaths/min, Blood Pressure: 96/55 mmHg. Eyes Nonicteric. Reactive to light. Ears, Nose, Mouth, and Throat Lips, teeth, and gums WNL.Marland Kitchen Moist mucosa without lesions. Neck supple and nontender. No palpable supraclavicular or cervical adenopathy. Normal sized without goiter. Respiratory WNL. No retractions.. Cardiovascular Pedal Pulses WNL. No clubbing, cyanosis or edema. Chest Breasts symmetical and no nipple discharge.. Breast tissue WNL, no masses, lumps, or tenderness.. Gastrointestinal (GI) Abdomen without masses or tenderness.. No liver or spleen enlargement or tenderness.. Lymphatic No adneopathy. No adenopathy. No adenopathy. Musculoskeletal Adexa without tenderness or enlargement.. Digits and nails w/o clubbing, cyanosis, infection, petechiae, ischemia, or inflammatory conditions.Marland Kitchen Psychiatric Judgement and insight Intact.. No evidence of depression, anxiety, or agitation.. General Notes: the sacral region is completely healed and there are no open ulcerations. The area on her right scapular region has a small external opening but undermines about 1.5 cm between the 2 and 6:00 position. The undermining between the 6 and 12:00 position is about 4 mm. There is no surrounding cellulitis Integumentary (Hair, Skin) No suspicious lesions. No crepitus or fluctuance. No peri-wound  warmth or erythema. No masses.. Wound #3 status is Open. Original cause of wound was Pressure Injury. The wound is located on the Right Back. The wound measures 0.8cm length x 0.5cm width x 0.5cm depth; 0.314cm^2 area and 0.157cm^3 Utley, Kemisha M. ([id number]) volume. The wound is limited to skin breakdown. There is no  tunneling noted, however, there is undermining starting at 2:00 and ending at 6:00 with a maximum distance of 1.5cm. There is a medium amount of serosanguineous drainage noted. The wound margin is distinct with the outline attached to the wound base. There is large (67-100%) pink, pale granulation within the wound bed. There is no necrotic tissue within the wound bed. The periwound skin appearance exhibited: Moist. The periwound skin appearance did not exhibit: Callus, Crepitus, Excoriation, Fluctuance, Friable, Induration, Localized Edema, Rash, Scarring, Dry/Scaly, Maceration, Atrophie Blanche, Cyanosis, Ecchymosis, Hemosiderin Staining, Mottled, Pallor, Rubor, Erythema. Periwound temperature was noted as No Abnormality. Assessment Active Problems ICD-10 G82.52 - Quadriplegia, C1-C4 incomplete E44.0 - Moderate protein-calorie malnutrition L89.113 - Pressure ulcer of right upper back, stage 3 F17.218 - Nicotine dependence, cigarettes, with other nicotine-induced disorders This pleasant 50 year old patient returns to see us back again after about 6 months being lost to follow-up. The interim she has begun smoking again in spite of having pneumonia and being admitted to the hospital. We will continue with 1/4 inch iodoform gauze to be packed into the area of her right shoulder and apply appropriate foam border. I have spent 3 minutes discussing with her the need to completely give up smoking and have discussed the risks benefits alternatives and all the possible complications of her smoking her bed surface, wheelchair surface have been adequate and we have reiterated off loading in great detail. Protein supplements, vitamin E, vitamin C and zinc have also been advised She is under complex care and we will continue to support her to the best of our ability. Plan Wound Cleansing: Wound #3 Right Back: Clean wound with Normal Saline. [name], [name] ([id number]) Skin Barriers/Peri-Wound  Care: Wound #3 Right Back: Skin Prep Primary Wound Dressing: Wound #3 Right Back: Iodoform packing Gauze - 1/4 inch Secondary Dressing: Wound #3 Right Back: Dry Gauze Other - Telfa island Dressing Change Frequency: Wound #3 Right Back: Change dressing every day. Follow-up Appointments: Wound #3 Right Back: Return Appointment in: - 3 weeks due to transportation Off-Loading: Wound #3 Right Back: Mattress Turn and reposition every 2 hours Additional Orders / Instructions: Wound #3 Right Back: Stop Smoking Increase protein intake. Other: - VIt C, VIT A, ZINC, MVI Home Health: Wound #3 Right Back: Continue Home Health Visits - [facility] Home Health Nurse may visit PRN to address patient s wound care needs. FACE TO FACE ENCOUNTER: MEDICARE and MEDICAID PATIENTS: I certify that this patient is under my care and that I had a face-to-face encounter that meets the physician face-to-face encounter requirements with this patient on this date. The encounter with the patient was in whole or in part for the following MEDICAL CONDITION: (primary reason for Home Healthcare) MEDICAL NECESSITY: I certify, that based on my findings, NURSING services are a medically necessary home health service. HOME BOUND STATUS: I certify that my clinical findings support that this patient is homebound (i.e., Due to illness or injury, pt requires aid of supportive devices such as crutches, cane, wheelchairs, walkers, the use of special transportation or the assistance of another person to leave their place of residence. There is a normal inability to leave the home and doing  so requires considerable and taxing effort. Other absences are for medical reasons / religious services and are infrequent or of short duration when for other reasons). If current dressing causes regression in wound condition, may D/C ordered dressing product/s and apply Normal Saline Moist Dressing daily until next Wound Healing Center /  Other MD appointment. Notify Wound Healing Center of regression in wound condition at [phone]. Please direct any NON-WOUND related issues/requests for orders to patient's Primary Care Physician [name], [name] ([id number]) This pleasant 50 year old patient returns to see [place] back again after about 6 months being lost to follow-up. The interim she has begun smoking again in spite of having pneumonia and being admitted to the hospital. We will continue with 1/4 inch iodoform gauze to be packed into the area of her right shoulder and apply appropriate foam border. I have spent 3 minutes discussing with her the need to completely give up smoking and have discussed the risks benefits alternatives and all the possible complications of her smoking her bed surface, wheelchair surface have been adequate and we have reiterated off loading in great detail. Protein supplements, vitamin E, vitamin C and zinc have also been advised She is under complex care and we will continue to support her to the best of our ability. Electronic Signature(s) Signed: 06/16/2016 3:57:45 PM By: Evlyn Kanner MD, FACS Previous Signature: 06/16/2016 9:35:31 AM Version By: Evlyn Kanner MD, FACS Entered By: Evlyn Kanner on 06/16/2016 15:57:44 Rauth, [name] ([id number]) -------------------------------------------------------------------------------- ROS/PFSH Details Patient Name: LONDA, MACKOWSKI. 06/16/2016 9:00 Date of Service: AM Medical Record [id number] Number: Patient Account Number: [id number] 03/30/1966 (50 y.o. Treating RN: Afful, RN, BSN, [place] Sink Date of Birth/Sex: Female) Other Clinician: Primary Care Physician: [place], JULIE Treating Donelle Baba Referring Physician: [place], JULIE Physician/Extender: Weeks in Treatment: 0 Information Obtained From Patient Wound History Do you currently have one or more open woundso Yes How many open wounds do you currently haveo 1 Approximately how long have  you had your woundso months How have you been treating your wound(s) until nowo dry dressing Has your wound(s) ever healed and then re-openedo No Have you had any lab work done in the past montho No Have you tested positive for an antibiotic resistant organism (MRSA, VRE)o No Have you tested positive for osteomyelitis (bone infection)o No Have you had any tests for circulation on your legso No Integumentary (Skin) Complaints and Symptoms: Positive for: Wounds Medical History: Positive for: History of pressure wounds Negative for: History of Burn Constitutional Symptoms (General Health) Complaints and Symptoms: No Complaints or Symptoms Eyes Complaints and Symptoms: No Complaints or Symptoms Medical History: Negative for: Cataracts; Glaucoma; Optic Neuritis Ear/Nose/Mouth/Throat Complaints and Symptoms: No Complaints or Symptoms Swinford, Dellanira M. ([id number]) Medical History: Negative for: Chronic sinus problems/congestion; Middle ear problems Hematologic/Lymphatic Complaints and Symptoms: No Complaints or Symptoms Medical History: Negative for: Anemia; Hemophilia; Human Immunodeficiency Virus; Lymphedema; Sickle Cell Disease Respiratory Complaints and Symptoms: No Complaints or Symptoms Medical History: Positive for: Asthma; Chronic Obstructive Pulmonary Disease (COPD) Cardiovascular Complaints and Symptoms: No Complaints or Symptoms Medical History: Negative for: Angina; Arrhythmia; Congestive Heart Failure; Coronary Artery Disease; Deep Vein Thrombosis; Hypertension; Hypotension; Myocardial Infarction; Peripheral Arterial Disease; Peripheral Venous Disease; Phlebitis; Vasculitis Gastrointestinal Complaints and Symptoms: No Complaints or Symptoms Medical History: Negative for: Cirrhosis ; Colitis; Crohnos; Hepatitis A; Hepatitis B; Hepatitis C Endocrine Complaints and Symptoms: No Complaints or Symptoms Medical History: Negative for: Type I Diabetes; Type II  Diabetes Genitourinary Complaints and Symptoms: No Complaints or Symptoms Medical  History: Negative for: End Stage Renal Disease Vukelich, Kanda M. ([id number]) Past Medical History Notes: urostomy Immunological Complaints and Symptoms: No Complaints or Symptoms Medical History: Negative for: Lupus Erythematosus; Raynaudos; Scleroderma Musculoskeletal Complaints and Symptoms: No Complaints or Symptoms Medical History: Negative for: Gout; Rheumatoid Arthritis; Osteoarthritis; Osteomyelitis Past Medical History Notes: right arm contracture Neurologic Complaints and Symptoms: No Complaints or Symptoms Medical History: Positive for: Paraplegia - C 3 paraplegia Oncologic Complaints and Symptoms: No Complaints or Symptoms Medical History: Negative for: Received Chemotherapy; Received Radiation Hospitalization / Surgery History Name of Hospital Purpose of Hospitalization/Surgery Date [facility] collapsed lung and lung congestion 05/13/2014 Family and Social History Cancer: Yes - Mother; Diabetes: Yes - Siblings; Heart Disease: No; Hereditary Spherocytosis: No; Hypertension: Yes - Siblings; Kidney Disease: No; Lung Disease: No; Seizures: No; Stroke: No; Thyroid Problems: No; Tuberculosis: No; Current every day smoker; Marital Status - Separated; Alcohol Use: Never; Drug Use: No History; Caffeine Use: Never; Financial Concerns: No; Food, Clothing or Shelter Needs: No; Support System Lacking: No; Transportation Concerns: No; Advanced Directives: No; Patient does not want information on Advanced Directives Physician Affirmation I have reviewed and agree with the above information. LANDY, MACE ([id number]) Electronic Signature(s) Signed: 06/16/2016 1:04:27 PM By: Elpidio Eric BSN, RN Signed: 06/16/2016 3:52:57 PM By: Evlyn Kanner MD, FACS Entered By: Evlyn Kanner on 06/16/2016 09:27:56 Knabe, [name]  ([id number]) -------------------------------------------------------------------------------- SuperBill Details Patient Name: MALLOREE, RABOIN. Date of Service: 06/16/2016 Medical Record Patient Account Number: [id number] Number: Afful, RN, BSN, Treating RN: 06-14-1966 (50 y.o. 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, C1-C4 incomplete E44.0 Moderate protein-calorie malnutrition L89.113 Pressure ulcer of right upper back, stage 3 F17.218 Nicotine dependence, cigarettes, with other nicotine-induced disorders Facility Procedures CPT4 Code Description: [id number] 99213 - WOUND CARE VISIT-LEV 3 EST PT Modifier: Quantity: 1 CPT4 Code Description: [id number] 99406-SMOKING CESSATION 3-10MINS ICD-10 Description Diagnosis G82.52 Quadriplegia, C1-C4 incomplete E44.0 Moderate protein-calorie malnutrition L89.113 Pressure ulcer of right upper back, stage 3 F17.218 Nicotine  dependence, cigarettes, with other nicotin Modifier: e-induced dis Quantity: 1 orders Physician Procedures CPT4 Code Description: 4259563 99214 - WC PHYS LEVEL 4 - EST PT ICD-10 Description Diagnosis G82.52 Quadriplegia, C1-C4 incomplete E44.0 Moderate protein-calorie malnutrition L89.113 Pressure ulcer of right upper back, stage 3 F17.218 Nicotine  dependence, cigarettes, with other nicotin Modifier: e-induced diso Quantity: 1 rders CPT4 Code Description: 99406 99406- SMOKING CESSATION 3-10 MINS ICD-10 Description Diagnosis G82.52 Quadriplegia, C1-C4 incomplete RONIQUA, KINTZ. ([id number]) Modifier: Quantity: 1 Electronic Signature(s) Signed: 06/16/2016 1:03:40 PM By: Elpidio Eric BSN, RN Signed: 06/16/2016 3:52:57 PM By: Evlyn Kanner MD, FACS Previous Signature: 06/16/2016 9:36:39 AM Version By:  Evlyn Kanner MD, FACS Previous Signature: 06/16/2016 9:36:07 AM Version By: Evlyn Kanner MD, FACS Entered By: Elpidio Eric on 06/16/2016 13:03:40

## 2016-07-06 ENCOUNTER — Ambulatory Visit: Payer: Medicare Other | Admitting: Surgery

## 2016-07-21 ENCOUNTER — Ambulatory Visit: Payer: Medicare Other | Admitting: Surgery

## 2016-08-04 ENCOUNTER — Ambulatory Visit: Payer: Medicare Other | Admitting: Surgery

## 2016-09-07 ENCOUNTER — Inpatient Hospital Stay: Payer: Medicare Other

## 2016-09-07 ENCOUNTER — Encounter: Payer: Self-pay | Admitting: Emergency Medicine

## 2016-09-07 ENCOUNTER — Inpatient Hospital Stay
Admission: EM | Admit: 2016-09-07 | Discharge: 2016-09-09 | DRG: 193 | Disposition: A | Discharge status: Home Health | Payer: Medicare Other | Attending: Internal Medicine | Admitting: Internal Medicine

## 2016-09-07 ENCOUNTER — Emergency Department: Payer: Medicare Other

## 2016-09-07 DIAGNOSIS — L89613 Pressure ulcer of right heel, stage 3: Secondary | ICD-10-CM | POA: Diagnosis present

## 2016-09-07 DIAGNOSIS — Z7401 Bed confinement status: Secondary | ICD-10-CM

## 2016-09-07 DIAGNOSIS — J9811 Atelectasis: Secondary | ICD-10-CM | POA: Diagnosis present

## 2016-09-07 DIAGNOSIS — E876 Hypokalemia: Secondary | ICD-10-CM | POA: Diagnosis present

## 2016-09-07 DIAGNOSIS — G8250 Quadriplegia, unspecified: Secondary | ICD-10-CM | POA: Diagnosis present

## 2016-09-07 DIAGNOSIS — L89112 Pressure ulcer of right upper back, stage 2: Secondary | ICD-10-CM | POA: Diagnosis present

## 2016-09-07 DIAGNOSIS — J181 Lobar pneumonia, unspecified organism: Secondary | ICD-10-CM

## 2016-09-07 DIAGNOSIS — Z79899 Other long term (current) drug therapy: Secondary | ICD-10-CM

## 2016-09-07 DIAGNOSIS — L8990 Pressure ulcer of unspecified site, unspecified stage: Secondary | ICD-10-CM | POA: Insufficient documentation

## 2016-09-07 DIAGNOSIS — Z8249 Family history of ischemic heart disease and other diseases of the circulatory system: Secondary | ICD-10-CM

## 2016-09-07 DIAGNOSIS — L89622 Pressure ulcer of left heel, stage 2: Secondary | ICD-10-CM | POA: Diagnosis present

## 2016-09-07 DIAGNOSIS — L89153 Pressure ulcer of sacral region, stage 3: Secondary | ICD-10-CM | POA: Diagnosis present

## 2016-09-07 DIAGNOSIS — J9601 Acute respiratory failure with hypoxia: Secondary | ICD-10-CM | POA: Diagnosis present

## 2016-09-07 DIAGNOSIS — Z91013 Allergy to seafood: Secondary | ICD-10-CM

## 2016-09-07 DIAGNOSIS — Z87440 Personal history of urinary (tract) infections: Secondary | ICD-10-CM | POA: Diagnosis not present

## 2016-09-07 DIAGNOSIS — J439 Emphysema, unspecified: Secondary | ICD-10-CM | POA: Diagnosis present

## 2016-09-07 DIAGNOSIS — J189 Pneumonia, unspecified organism: Principal | ICD-10-CM | POA: Diagnosis present

## 2016-09-07 DIAGNOSIS — G8929 Other chronic pain: Secondary | ICD-10-CM | POA: Diagnosis present

## 2016-09-07 DIAGNOSIS — Z800 Family history of malignant neoplasm of digestive organs: Secondary | ICD-10-CM

## 2016-09-07 DIAGNOSIS — F329 Major depressive disorder, single episode, unspecified: Secondary | ICD-10-CM | POA: Diagnosis present

## 2016-09-07 DIAGNOSIS — J9690 Respiratory failure, unspecified, unspecified whether with hypoxia or hypercapnia: Secondary | ICD-10-CM

## 2016-09-07 DIAGNOSIS — F17200 Nicotine dependence, unspecified, uncomplicated: Secondary | ICD-10-CM | POA: Diagnosis not present

## 2016-09-07 DIAGNOSIS — R000 Tachycardia, unspecified: Secondary | ICD-10-CM | POA: Diagnosis present

## 2016-09-07 DIAGNOSIS — J440 Chronic obstructive pulmonary disease with acute lower respiratory infection: Secondary | ICD-10-CM | POA: Diagnosis not present

## 2016-09-07 LAB — COMPREHENSIVE METABOLIC PANEL
ALK PHOS: 101 U/L (ref 38–126)
ALT: 10 U/L — AB (ref 14–54)
ANION GAP: 17 — AB (ref 5–15)
AST: 19 U/L (ref 15–41)
Albumin: 4.7 g/dL (ref 3.5–5.0)
BUN: 16 mg/dL (ref 6–20)
CALCIUM: 9.5 mg/dL (ref 8.9–10.3)
CO2: 16 mmol/L — AB (ref 22–32)
CREATININE: 0.38 mg/dL — AB (ref 0.44–1.00)
Chloride: 108 mmol/L (ref 101–111)
Glucose, Bld: 99 mg/dL (ref 65–99)
Potassium: 3.4 mmol/L — ABNORMAL LOW (ref 3.5–5.1)
SODIUM: 141 mmol/L (ref 135–145)
Total Bilirubin: 0.9 mg/dL (ref 0.3–1.2)
Total Protein: 9.2 g/dL — ABNORMAL HIGH (ref 6.5–8.1)

## 2016-09-07 LAB — FIBRIN DERIVATIVES D-DIMER (ARMC ONLY): FIBRIN DERIVATIVES D-DIMER (ARMC): 812.19 — AB (ref 0.00–499.00)

## 2016-09-07 LAB — CBC
HCT: 46.8 % (ref 35.0–47.0)
HEMOGLOBIN: 15.3 g/dL (ref 12.0–16.0)
MCH: 29.7 pg (ref 26.0–34.0)
MCHC: 32.6 g/dL (ref 32.0–36.0)
MCV: 91 fL (ref 80.0–100.0)
PLATELETS: 237 10*3/uL (ref 150–440)
RBC: 5.14 MIL/uL (ref 3.80–5.20)
RDW: 16.2 % — ABNORMAL HIGH (ref 11.5–14.5)
WBC: 6.4 10*3/uL (ref 3.6–11.0)

## 2016-09-07 LAB — TROPONIN I

## 2016-09-07 LAB — MRSA PCR SCREENING: MRSA by PCR: POSITIVE — AB

## 2016-09-07 LAB — INFLUENZA PANEL BY PCR (TYPE A & B)
Influenza A By PCR: NEGATIVE
Influenza B By PCR: NEGATIVE

## 2016-09-07 LAB — STREP PNEUMONIAE URINARY ANTIGEN: Strep Pneumo Urinary Antigen: NEGATIVE

## 2016-09-07 MED ORDER — OXYCODONE-ACETAMINOPHEN 5-325 MG PO TABS
1.0000 | ORAL_TABLET | Freq: Four times a day (QID) | ORAL | Status: DC | PRN
  Administered 2016-09-08 (×2): 1 via ORAL
  Filled 2016-09-07 (×2): qty 1

## 2016-09-07 MED ORDER — BECLOMETHASONE DIPROPIONATE 40 MCG/ACT IN AERS: 2.0000 | INHALATION_SPRAY | Freq: Two times a day (BID) | RESPIRATORY_TRACT | Status: DC

## 2016-09-07 MED ORDER — DEXTROSE 5 % IV SOLN: 1.0000 g | Freq: Once | INTRAVENOUS | Status: DC

## 2016-09-07 MED ORDER — DEXTROSE 5 % IV SOLN
500.0000 mg | Freq: Every day | INTRAVENOUS | Status: DC
  Filled 2016-09-07: qty 500

## 2016-09-07 MED ORDER — DIAZEPAM 2 MG PO TABS
2.0000 mg | ORAL_TABLET | Freq: Three times a day (TID) | ORAL | Status: DC | PRN
  Administered 2016-09-07: 2 mg via ORAL
  Filled 2016-09-07: qty 1

## 2016-09-07 MED ORDER — ACETYLCYSTEINE 20 % IN SOLN
3.0000 mL | Freq: Three times a day (TID) | RESPIRATORY_TRACT | Status: DC
  Administered 2016-09-07 – 2016-09-08 (×3): 3 mL via RESPIRATORY_TRACT
  Administered 2016-09-08: 08:00:00 via RESPIRATORY_TRACT
  Administered 2016-09-09 (×2): 3 mL via RESPIRATORY_TRACT
  Filled 2016-09-07 (×7): qty 4

## 2016-09-07 MED ORDER — ACETAMINOPHEN 325 MG PO TABS: 650.0000 mg | ORAL_TABLET | Freq: Four times a day (QID) | ORAL | Status: DC | PRN

## 2016-09-07 MED ORDER — DEXTROSE 5 % IV SOLN
1.0000 g | Freq: Every morning | INTRAVENOUS | Status: DC
  Administered 2016-09-08 – 2016-09-09 (×2): 1 g via INTRAVENOUS
  Filled 2016-09-07 (×2): qty 10

## 2016-09-07 MED ORDER — TIOTROPIUM BROMIDE MONOHYDRATE 18 MCG IN CAPS
18.0000 ug | ORAL_CAPSULE | Freq: Every day | RESPIRATORY_TRACT | Status: DC
  Administered 2016-09-07 – 2016-09-09 (×3): 18 ug via RESPIRATORY_TRACT
  Filled 2016-09-07: qty 5

## 2016-09-07 MED ORDER — GUAIFENESIN-DM 100-10 MG/5ML PO SYRP
5.0000 mL | ORAL_SOLUTION | ORAL | Status: DC | PRN
  Administered 2016-09-07: 5 mL via ORAL
  Filled 2016-09-07: qty 5

## 2016-09-07 MED ORDER — DEXTROSE 5 % IV SOLN
500.0000 mg | Freq: Every morning | INTRAVENOUS | Status: DC
  Administered 2016-09-08 – 2016-09-09 (×2): 500 mg via INTRAVENOUS
  Filled 2016-09-07 (×2): qty 500

## 2016-09-07 MED ORDER — NICOTINE 21 MG/24HR TD PT24
21.0000 mg | MEDICATED_PATCH | Freq: Every day | TRANSDERMAL | Status: DC
  Administered 2016-09-07 – 2016-09-09 (×3): 21 mg via TRANSDERMAL
  Filled 2016-09-07 (×3): qty 1

## 2016-09-07 MED ORDER — DEXTROSE 5 % IV SOLN
500.0000 mg | Freq: Once | INTRAVENOUS | Status: AC
  Administered 2016-09-07: 500 mg via INTRAVENOUS
  Filled 2016-09-07: qty 500

## 2016-09-07 MED ORDER — FLUTICASONE PROPIONATE 50 MCG/ACT NA SUSP
2.0000 | Freq: Every day | NASAL | Status: DC
  Administered 2016-09-07 – 2016-09-09 (×3): 2 via NASAL
  Filled 2016-09-07: qty 16

## 2016-09-07 MED ORDER — POTASSIUM CHLORIDE CRYS ER 20 MEQ PO TBCR
40.0000 meq | EXTENDED_RELEASE_TABLET | Freq: Once | ORAL | Status: AC
  Administered 2016-09-07: 40 meq via ORAL
  Filled 2016-09-07: qty 2

## 2016-09-07 MED ORDER — LORAZEPAM 2 MG/ML IJ SOLN
1.0000 mg | Freq: Once | INTRAMUSCULAR | Status: AC
  Administered 2016-09-07: 1 mg via INTRAVENOUS
  Filled 2016-09-07: qty 1

## 2016-09-07 MED ORDER — IPRATROPIUM-ALBUTEROL 0.5-2.5 (3) MG/3ML IN SOLN
3.0000 mL | Freq: Three times a day (TID) | RESPIRATORY_TRACT | Status: DC
  Administered 2016-09-07 – 2016-09-09 (×6): 3 mL via RESPIRATORY_TRACT
  Filled 2016-09-07 (×6): qty 3

## 2016-09-07 MED ORDER — GABAPENTIN 100 MG PO CAPS
100.0000 mg | ORAL_CAPSULE | Freq: Three times a day (TID) | ORAL | Status: DC
  Administered 2016-09-07 – 2016-09-09 (×7): 100 mg via ORAL
  Filled 2016-09-07 (×7): qty 1

## 2016-09-07 MED ORDER — MIRTAZAPINE 15 MG PO TABS
30.0000 mg | ORAL_TABLET | Freq: Every day | ORAL | Status: DC
  Administered 2016-09-07 – 2016-09-08 (×2): 30 mg via ORAL
  Filled 2016-09-07 (×2): qty 2

## 2016-09-07 MED ORDER — ENOXAPARIN SODIUM 40 MG/0.4ML ~~LOC~~ SOLN
40.0000 mg | SUBCUTANEOUS | Status: DC
  Administered 2016-09-07: 40 mg via SUBCUTANEOUS
  Filled 2016-09-07: qty 0.4

## 2016-09-07 MED ORDER — DEXTROSE 5 % IV SOLN
1.0000 g | Freq: Every day | INTRAVENOUS | Status: DC
  Filled 2016-09-07: qty 10

## 2016-09-07 MED ORDER — CEFTRIAXONE SODIUM-DEXTROSE 1-3.74 GM-% IV SOLR
1.0000 g | Freq: Once | INTRAVENOUS | Status: AC
  Administered 2016-09-07: 1 g via INTRAVENOUS
  Filled 2016-09-07: qty 50

## 2016-09-07 MED ORDER — BUDESONIDE 0.25 MG/2ML IN SUSP
0.2500 mg | Freq: Two times a day (BID) | RESPIRATORY_TRACT | Status: DC
  Administered 2016-09-07 – 2016-09-09 (×4): 0.25 mg via RESPIRATORY_TRACT
  Filled 2016-09-07 (×4): qty 2

## 2016-09-07 MED ORDER — LEVOFLOXACIN 500 MG PO TABS: 500.0000 mg | ORAL_TABLET | Freq: Once | ORAL | Status: DC

## 2016-09-07 MED ORDER — IPRATROPIUM-ALBUTEROL 0.5-2.5 (3) MG/3ML IN SOLN
3.0000 mL | Freq: Once | RESPIRATORY_TRACT | Status: AC
  Administered 2016-09-07: 3 mL via RESPIRATORY_TRACT
  Filled 2016-09-07: qty 3

## 2016-09-07 MED ORDER — BACLOFEN 10 MG PO TABS
10.0000 mg | ORAL_TABLET | Freq: Three times a day (TID) | ORAL | Status: DC
  Administered 2016-09-07 – 2016-09-09 (×7): 10 mg via ORAL
  Filled 2016-09-07 (×7): qty 1

## 2016-09-07 MED ORDER — MUPIROCIN 2 % EX OINT
1.0000 "application " | TOPICAL_OINTMENT | Freq: Two times a day (BID) | CUTANEOUS | Status: DC
  Administered 2016-09-07 – 2016-09-09 (×4): 1 via NASAL
  Filled 2016-09-07: qty 22

## 2016-09-07 MED ORDER — CHLORHEXIDINE GLUCONATE CLOTH 2 % EX PADS
6.0000 | MEDICATED_PAD | Freq: Every day | CUTANEOUS | Status: DC
  Administered 2016-09-08 – 2016-09-09 (×2): 6 via TOPICAL

## 2016-09-07 MED ORDER — NITROFURANTOIN MACROCRYSTAL 50 MG PO CAPS
100.0000 mg | ORAL_CAPSULE | Freq: Two times a day (BID) | ORAL | Status: DC
  Administered 2016-09-07: 100 mg via ORAL
  Filled 2016-09-07: qty 2
  Filled 2016-09-07: qty 1

## 2016-09-07 NOTE — ED Notes (Signed)
This RN spoke with Dr. Sherryll BurgerShah regarding pt's HR, notified that patient's HR does increase when being woken up. Pt is noted to be drowsy but awakens with verbal stimuli and is able to answer questions. Dr. Sherryll BurgerShah aware at this time. Per Dr. Sherryll BurgerShah pt is to go to telemetry floor instead of 1C at this time.
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
## 2016-09-07 NOTE — ED Notes (Signed)
This RN notified Dr. Sherryll BurgerShah of this RN coming to bedside to awaken patient due to medication being done, pt's HR noted to be 140 when patient awoke, notified Dr. Sherryll BurgerShah that patient had been sleeping, was given 1mg  Ativan per Dr. Theora GianottiBrown's orders, and that patient had just finished a duoneb treatment. No new orders received at this time. Will continue to monitor. Pt's HR noted to be 121 at this time.

## 2016-09-07 NOTE — ED Triage Notes (Signed)
[facility] Patient also feeling hot one minute and cold the next. Per EMS patient was diminished in bilateral lower lobes and 92% RA. Gave duoneb treatment with improvement of lung sounds  And 95-98% Ra. Per EMS normally her O2 is around 92%. Patient is a quadriplegic and has hx of UTI.

## 2016-09-07 NOTE — Consult Note (Signed)
WOC Nurse wound consult note Reason for Consult: Stage 3 pressure injury to sacrum.  Chronic nonhealing.  Stage 2 pressure injury to right scapula at bony prominence, POA Stage 3 pressure injury to right heel, POA Stage 2 pressure injury to left heel.  Wound type:Chroninc nonhealing pressure injuries Albumin 4.7 Pressure Injury POA: Yes Measurement: Sacrum:  4 cm, x 9.2 cm x 2 cm pale pink nongranulating Right scapula 2 cm x 2 cm darkened perimeter with 0.2 nonintact center Right heel 1 cm nonintact, pale pink with surrounding dry, cracked skin Left heel 0.2 cm nonintact center with surrounding dry, cracked skin Wound bed: see above Drainage (amount, consistency, odor) Minimal serosanguinous Periwound:Dry skin Dressing procedure/placement/frequency:Cleanse sacral wound with NS and pat gently dry.  Apply Aquacel Ag to wound bed.  Cover with ABD pad and tape.   Change every other day and PRN soilage.   Cleanse wounds to right scapula, left and right heel with soap and water and pat gently dry.  Apply silicone border foam dressing.  Change every 3 days and PRN soilage.   Prevalon boots to offload heel pressure Will order mattress with low air loss feature.   Will not follow at this time.  Please re-consult if needed.  Maple HudsonKaren Angus Amini RN BSN CWON Pager [phone]
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
## 2016-09-07 NOTE — ED Notes (Signed)
Report given to Crystal, RN.

## 2016-09-07 NOTE — Consult Note (Signed)
[facility]      Assessment and Plan:  A:  Acute respiratory failure due to Pneumonia.  Atelectasis with respiratory muscle weakness.  Chronic bronchitis with excess sputum and poor mucus clearance.   P: --Continue abx coverage.  --Will add aerosol treatment and chest percussion to aid in airway clearance.  --limit narcotic/sedatives which may impede airway clearance.   Date: 09/07/2016  MRN# [id number] [name] September 22, 1965  Referring Physician: Dr. Sherryll BurgerShah.   [name] MastersCharlotte M [name] is a 51 y.o. old female seen in consultation for chief complaint of:    Chief Complaint  Patient presents with  . Shortness of Breath    HPI:  This is a 51 year old woman with PMH of paraplegia, COPD, sacral decubitus ulcer and neurogenic bladder with urostomy and chronic left lower lobe collapse secondary to mucous plugging. [facility] aspiration pneumonia 11/20-11/22/18  EMS was called due to progressive dyspnea, her sat was 80% on RA on their arrival.   Currently the patient notes that she has a cough, but she notes that she is having trouble clearing her secretions.     PMHX:   Past Medical History:  Diagnosis Date  . History of urostomy   . Paraplegic spinal paralysis [facility](HCC)    Surgical Hx:  History reviewed. No pertinent surgical history. Family Hx:  History reviewed. No pertinent family history. Social Hx:   Social History  Substance Use Topics  . Smoking status: Never Smoker  . Smokeless tobacco: Never Used  . Alcohol use No   Medication:     Reviewed.   Allergies:  Shellfish allergy  Review of Systems: Gen:  Denies  fever, sweats, chills HEENT: Denies blurred vision, double vision. bleeds, sore throat Cvc:  No dizziness, chest pain. Resp:   Notes cough.  Gi: Denies swallowing difficulty, stomach pain. Gu:  Has catheter in place.  Ext:   No Joint pain, stiffness. Skin: No skin rash,  hives  Endoc:  No polyuria,  polydipsia. Psych: No depression, insomnia. Other:  All other systems were reviewed with the patient and were negative other that what is mentioned in the HPI.   Physical Examination:   VS: BP 105/68   Pulse (!) 125   Temp 98 F (36.7 C) (Oral)   Resp (!) 29   Ht 5\' 3"  (1.6 m)   Wt 93 lb (42.2 kg)   SpO2 91%   BMI 16.47 kg/m   General Appearance: No distress  Neuro:decreased upper and lower arm strength.   speech normal,  HEENT: PERRLA, EOM intact.   Pulmonary: bilateral crackles, reduced air entry bilaterally.  CardiovascularNormal S1,S2.  No m/r/g.   Abdomen: Benign, Soft, non-tender. Renal:  No costovertebral tenderness  GU:  No performed at this time. Endoc: No evident thyromegaly, no signs of acromegaly. Skin:   warm, no rashes, no ecchymosis  Extremities: normal, no cyanosis, clubbing.  Other findings:    LABORATORY PANEL:   CBC  Recent Labs Lab 09/07/16 0553  WBC 6.4  HGB 15.3  HCT 46.8  PLT 237   ------------------------------------------------------------------------------------------------------------------  Chemistries   Recent Labs Lab 09/07/16 0553  NA 141  K 3.4*  CL 108  CO2 16*  GLUCOSE 99  BUN 16  CREATININE 0.38*  CALCIUM 9.5  AST 19  ALT 10*  ALKPHOS 101  BILITOT 0.9   ------------------------------------------------------------------------------------------------------------------  Cardiac Enzymes  Recent Labs Lab 09/07/16 0553  TROPONINI <0.03   ------------------------------------------------------------  RADIOLOGY:  Ct Chest Wo  Contrast  Result Date: 09/07/2016 CLINICAL DATA:  Progressive dyspnea EXAM: CT CHEST WITHOUT CONTRAST TECHNIQUE: Multidetector CT imaging of the chest was performed following the standard protocol without IV contrast. COMPARISON:  09/07/2016 FINDINGS: Cardiovascular: Thoracic aorta demonstrates mild calcifications without evidence of aneurysmal dilatation. No significant cardiac enlargement is  seen. Small pericardial effusion is noted of uncertain chronicity. Mediastinum/Nodes: Large anterior skin defect is noted in the midline likely related to prior tracheostomy. No significant hilar or mediastinal adenopathy is noted. Lungs/Pleura: The lungs demonstrate evidence of bilateral lower lobe consolidation slightly worse on the left than the right. No sizable effusion is seen. No parenchymal nodules are noted. There are secretions identified within the trachea as well as the left bronchial tree contributing to the consolidation. Upper Abdomen: Within normal limits. Musculoskeletal: Postsurgical changes are noted in the cervical spine. Intrathecal catheter is noted in the lower thoracic spine IMPRESSION: Bilateral lower lobe consolidation worse on the left than the right. Associated secretions are noted within the trachea and left bronchial tree contributing to these changes. Pericardial effusion of uncertain chronicity. Chronic changes as described above. Electronically Signed   By: Alcide Clever M.D.   On: 09/07/2016 11:36   Dg Chest Portable 1 View  Result Date: 09/07/2016 CLINICAL DATA:  Dyspnea, onset yesterday. EXAM: PORTABLE CHEST 1 VIEW COMPARISON:  09/19/2011 FINDINGS: Left base consolidation and volume loss, probably involving much of the left lower lobe. Right lung is clear. Pulmonary vasculature is normal. IMPRESSION: Left lower lobe consolidation and volume loss. If the patient has clinical pneumonia, follow-up PA and lateral chest X-ray is recommended in 3-4 weeks following trial of antibiotic therapy to ensure resolution and exclude underlying malignancy. If the patient does not have clinical pneumonia, recommend chest CT with contrast to exclude neoplasm. These results will be called to the ordering clinician or representative by the Radiologist Assistant, and communication documented in the PACS or zVision Dashboard. Electronically Signed   By: Ellery Plunk M.D.   On: 09/07/2016 06:17         Thank  you for the consultation and for allowing [facility] to assist in the care of your patient. Our recommendations are noted above.  Please contact us if we can be of further service.   Wells Guiles, MD.  Board Certified in Internal Medicine, Pulmonary Medicine, Critical Care Medicine, and Sleep Medicine.   Pulmonary and Critical Care Office Number: [phone]  Santiago Glad, M.D.  Stephanie Acre, M.D.  Billy Fischer, M.D  09/07/2016

## 2016-09-07 NOTE — Progress Notes (Signed)
Spoke with dr. Sherryll BurgerShah to make aware heart will spike to 140. However does not sustain. Currently low to mid 100's. Will continue to monitor no new orders

## 2016-09-07 NOTE — ED Notes (Signed)
This RN to bedside, pt's HR noted to be 147, pt states she feels like she can't breathe because she is having a panic attack. This RN stayed at bedside with the patient, pt is noted to go back to sleep, HR back to 124 at this time. Respirations even and unlabored when this RN exited the room.

## 2016-09-08 DIAGNOSIS — F17200 Nicotine dependence, unspecified, uncomplicated: Secondary | ICD-10-CM

## 2016-09-08 DIAGNOSIS — G8250 Quadriplegia, unspecified: Secondary | ICD-10-CM

## 2016-09-08 LAB — BASIC METABOLIC PANEL WITH GFR
Anion gap: 8 (ref 5–15)
BUN: 11 mg/dL (ref 6–20)
CO2: 23 mmol/L (ref 22–32)
Calcium: 10 mg/dL (ref 8.9–10.3)
Chloride: 110 mmol/L (ref 101–111)
Creatinine, Ser: 0.34 mg/dL — ABNORMAL LOW (ref 0.44–1.00)
GFR calc Af Amer: >60 mL/min
GFR calc non Af Amer: >60 mL/min
Glucose, Bld: 166 mg/dL — ABNORMAL HIGH (ref 65–99)
Potassium: 3.9 mmol/L (ref 3.5–5.1)
Sodium: 141 mmol/L (ref 135–145)

## 2016-09-08 LAB — HIV ANTIBODY (ROUTINE TESTING W REFLEX): HIV SCREEN 4TH GENERATION: NONREACTIVE

## 2016-09-08 MED ORDER — ENOXAPARIN SODIUM 30 MG/0.3ML ~~LOC~~ SOLN
30.0000 mg | SUBCUTANEOUS | Status: DC
  Administered 2016-09-08: 30 mg via SUBCUTANEOUS
  Filled 2016-09-08: qty 0.3

## 2016-09-08 MED ORDER — ENSURE ENLIVE PO LIQD
237.0000 mL | Freq: Three times a day (TID) | ORAL | Status: DC
  Administered 2016-09-08 – 2016-09-09 (×5): 237 mL via ORAL
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
## 2016-09-08 NOTE — Care Management (Signed)
[facility] with the diagnosis of pneumonia. Lives with boyfriend, [name] [phone]). Sees Dr. Ilene Qu[place]Monico in [place] every 3 months. [facility] x 4 years. CAP services. 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 Bedbound x 8 years. Medications are filled at [facility]. [facility]ses [facility] [facility] [employer] for transportation. Goes to the wound care center. Appetite goes up and down. Lost weight, not sure how much,  Boyfriend will transport.  States her standard wheelchair is falling apart. Would like to see if she can get a new when discharged. Gwenette GreetBrenda S Rajah Lamba RN MSN CCM Care Management

## 2016-09-08 NOTE — Progress Notes (Signed)
Due to pt weight <45kg, dose of lovenox decreased to 30mg  daily per protocol  Olene FlossMelissa D Camiyah Friberg, Pharm.D, BCPS Clinical Pharmacist

## 2016-09-08 NOTE — Progress Notes (Addendum)
Pt expressing that she's hungry, we have provided with [place] sandwich and help but pt is unable to eat, expresses that she feels tired but hungry, an ensure was provided. Will put in for a dietitian evaluation per MD's verbal order.
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
## 2016-09-09 ENCOUNTER — Inpatient Hospital Stay: Payer: Medicare Other

## 2016-09-09 LAB — BASIC METABOLIC PANEL
ANION GAP: 7 (ref 5–15)
BUN: 16 mg/dL (ref 6–20)
CO2: 24 mmol/L (ref 22–32)
Calcium: 9.1 mg/dL (ref 8.9–10.3)
Chloride: 105 mmol/L (ref 101–111)
Creatinine, Ser: <0.3 mg/dL — ABNORMAL LOW (ref 0.44–1.00)
GLUCOSE: 103 mg/dL — AB (ref 65–99)
POTASSIUM: 4 mmol/L (ref 3.5–5.1)
Sodium: 136 mmol/L (ref 135–145)

## 2016-09-09 LAB — CBC
HCT: 40.3 % (ref 35.0–47.0)
Hemoglobin: 13.5 g/dL (ref 12.0–16.0)
MCH: 30.5 pg (ref 26.0–34.0)
MCHC: 33.5 g/dL (ref 32.0–36.0)
MCV: 91 fL (ref 80.0–100.0)
PLATELETS: 218 10*3/uL (ref 150–440)
RBC: 4.42 MIL/uL (ref 3.80–5.20)
RDW: 16 % — AB (ref 11.5–14.5)
WBC: 9 10*3/uL (ref 3.6–11.0)

## 2016-09-09 LAB — LEGIONELLA PNEUMOPHILA SEROGP 1 UR AG: L. pneumophila Serogp 1 Ur Ag: NEGATIVE

## 2016-09-09 MED ORDER — ENSURE ENLIVE PO LIQD: 237.0000 mL | Freq: Three times a day (TID) | ORAL | 12 refills | Status: AC

## 2016-09-09 MED ORDER — METOPROLOL TARTRATE 25 MG PO TABS
12.5000 mg | ORAL_TABLET | Freq: Two times a day (BID) | ORAL | Status: DC
  Administered 2016-09-09: 12.5 mg via ORAL
  Filled 2016-09-09: qty 1

## 2016-09-09 MED ORDER — CEFUROXIME AXETIL 500 MG PO TABS: 500.0000 mg | ORAL_TABLET | Freq: Two times a day (BID) | ORAL | 0 refills | Status: DC

## 2016-09-09 MED ORDER — AZITHROMYCIN 250 MG PO TABS: 250.0000 mg | ORAL_TABLET | Freq: Every day | ORAL | Status: DC

## 2016-09-09 MED ORDER — AZITHROMYCIN 250 MG PO TABS: ORAL_TABLET | ORAL | 0 refills | Status: DC

## 2016-09-09 MED ORDER — METOPROLOL TARTRATE 25 MG PO TABS: 12.5000 mg | ORAL_TABLET | Freq: Two times a day (BID) | ORAL | 0 refills | Status: DC

## 2016-09-09 MED ORDER — CEFUROXIME AXETIL 500 MG PO TABS: 500.0000 mg | ORAL_TABLET | Freq: Two times a day (BID) | ORAL | Status: DC

## 2016-09-09 NOTE — Progress Notes (Signed)
CXR this AM reveals re-expansion of LLL with minimal residual plate-like atx. PCCM will sign off. Please call if we can be of further assistance  [name] Fischeravid Kayal Mula, MD PCCM service Mobile [phone] Pager [phone] 09/09/2016

## 2016-09-09 NOTE — Progress Notes (Signed)
Discharge instructions given to patient. Patient verbalized understanding. Patient was prepared for EMS transportation. Wound care has been completed on sacrum, right shoulder, and bilateral heels as per orders and new dressings were applied.  Patient tolerated well. EMS has been called and patient is currently awaiting transportation.

## 2016-09-09 NOTE — Progress Notes (Signed)
Patient's HR in 120s-130s. MD notified. Metoprolol 12.5 BID ordered. Will give and continue to monitor.
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
## 2016-09-10 NOTE — Care Management Note (Signed)
Case Management Note  Patient Details  Name: Mariane MastersCharlotte M Goettel MRN: [id number] Date of Birth: 05/20/1966  Subjective/Objective:       LATE ENTRY: [facility]              Action/Plan:   Expected Discharge Date:  09/09/16               Expected Discharge Plan:     In-House Referral:     Discharge planning Services     Post Acute Care Choice:    Choice offered to:     DME Arranged:    DME Agency:     HH Arranged:    HH Agency:     Status of Service:     If discussed at [employer] Meetings, dates discussed:    Additional Comments:  Murvin Gift A, RN 09/10/2016, 8:48 AM

## 2016-09-10 NOTE — Care Management (Signed)
09/09/08 at 3pm T.O from Dr Enedina FinnerSona Patel to [facility] Sajan Cheatwood, RN: Please resume home health [facility] and add any additional services as listed that are not already being provided. PT, RN, Aide, Respiratory Care.

## 2016-09-12 LAB — CULTURE, BLOOD (ROUTINE X 2)
Culture: NO GROWTH
Culture: NO GROWTH

## 2016-12-07 ENCOUNTER — Ambulatory Visit: Payer: Medicare Other | Admitting: Surgery

## 2016-12-08 ENCOUNTER — Encounter: Payer: Medicare Other | Attending: Surgery | Admitting: Surgery

## 2016-12-08 DIAGNOSIS — J449 Chronic obstructive pulmonary disease, unspecified: Secondary | ICD-10-CM | POA: Insufficient documentation

## 2016-12-08 DIAGNOSIS — L89113 Pressure ulcer of right upper back, stage 3: Secondary | ICD-10-CM | POA: Diagnosis present

## 2016-12-08 DIAGNOSIS — G8252 Quadriplegia, C1-C4 incomplete: Secondary | ICD-10-CM | POA: Insufficient documentation

## 2016-12-08 DIAGNOSIS — Z87891 Personal history of nicotine dependence: Secondary | ICD-10-CM | POA: Insufficient documentation

## 2016-12-08 DIAGNOSIS — E440 Moderate protein-calorie malnutrition: Secondary | ICD-10-CM | POA: Diagnosis not present

## 2016-12-10 IMAGING — CR DG SACRUM/COCCYX 2+V
1 series · 3 of 3 positions shown · non-contrast
Comparison: None.

CLINICAL DATA: Quadriplegic.  Lower extremity wound.

EXAM:
SACRUM AND COCCYX - 2+ VIEW

[Series 1: t pelvis ap · 0.14mm/px · 3 of 3 slices shown]
[im 1/3]
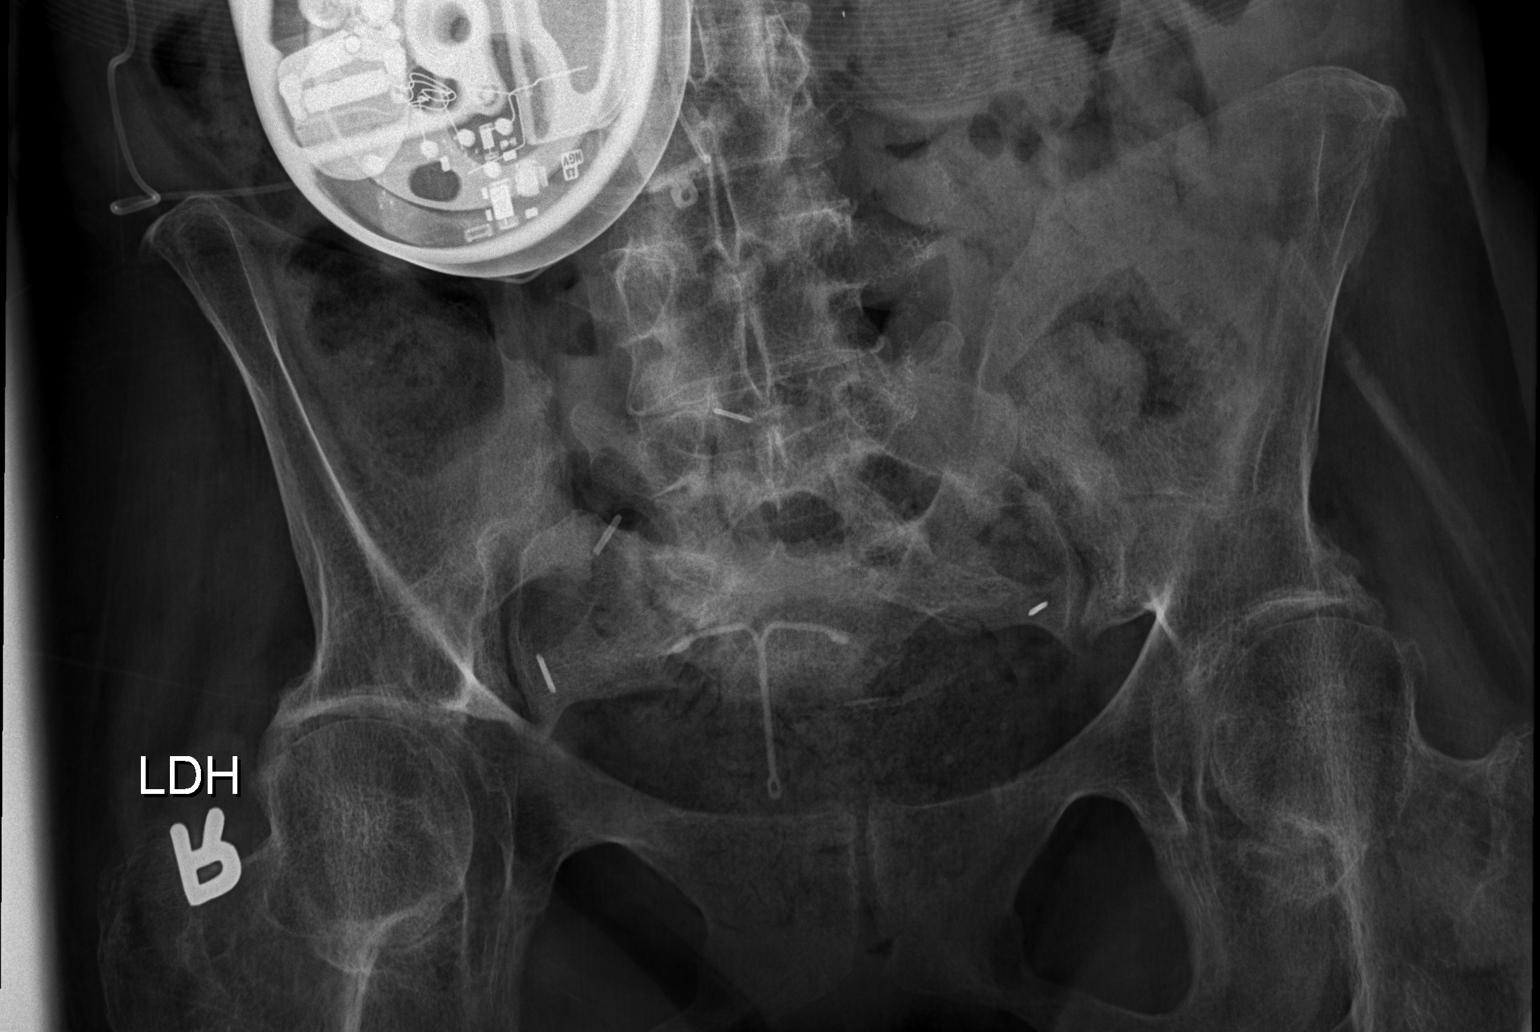
[im 2/3]
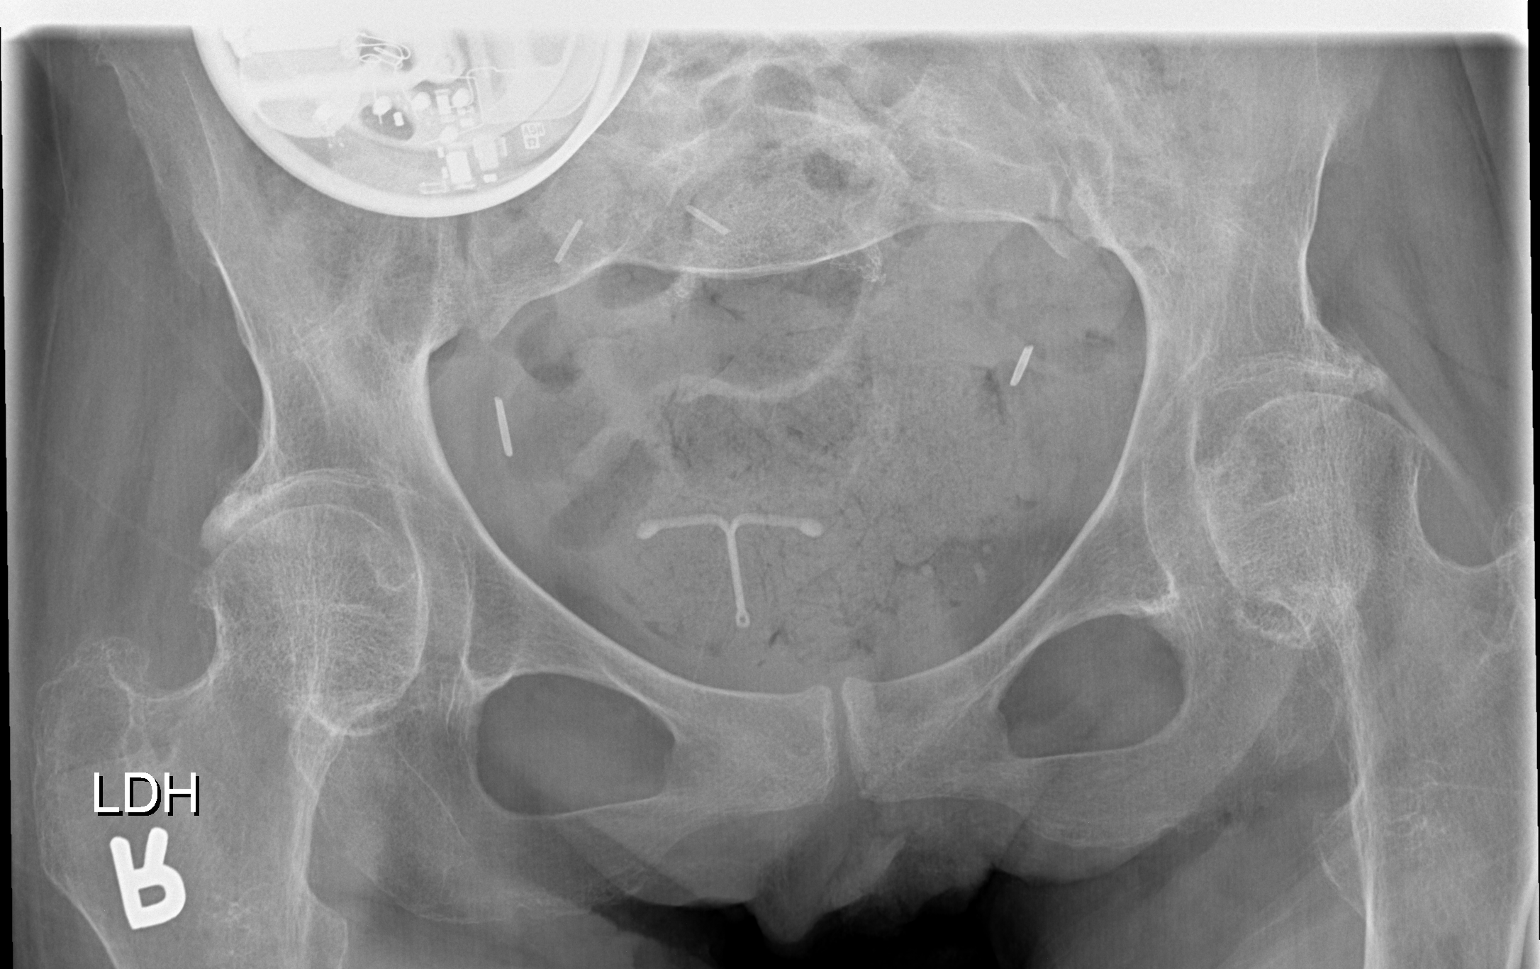
[im 3/3]
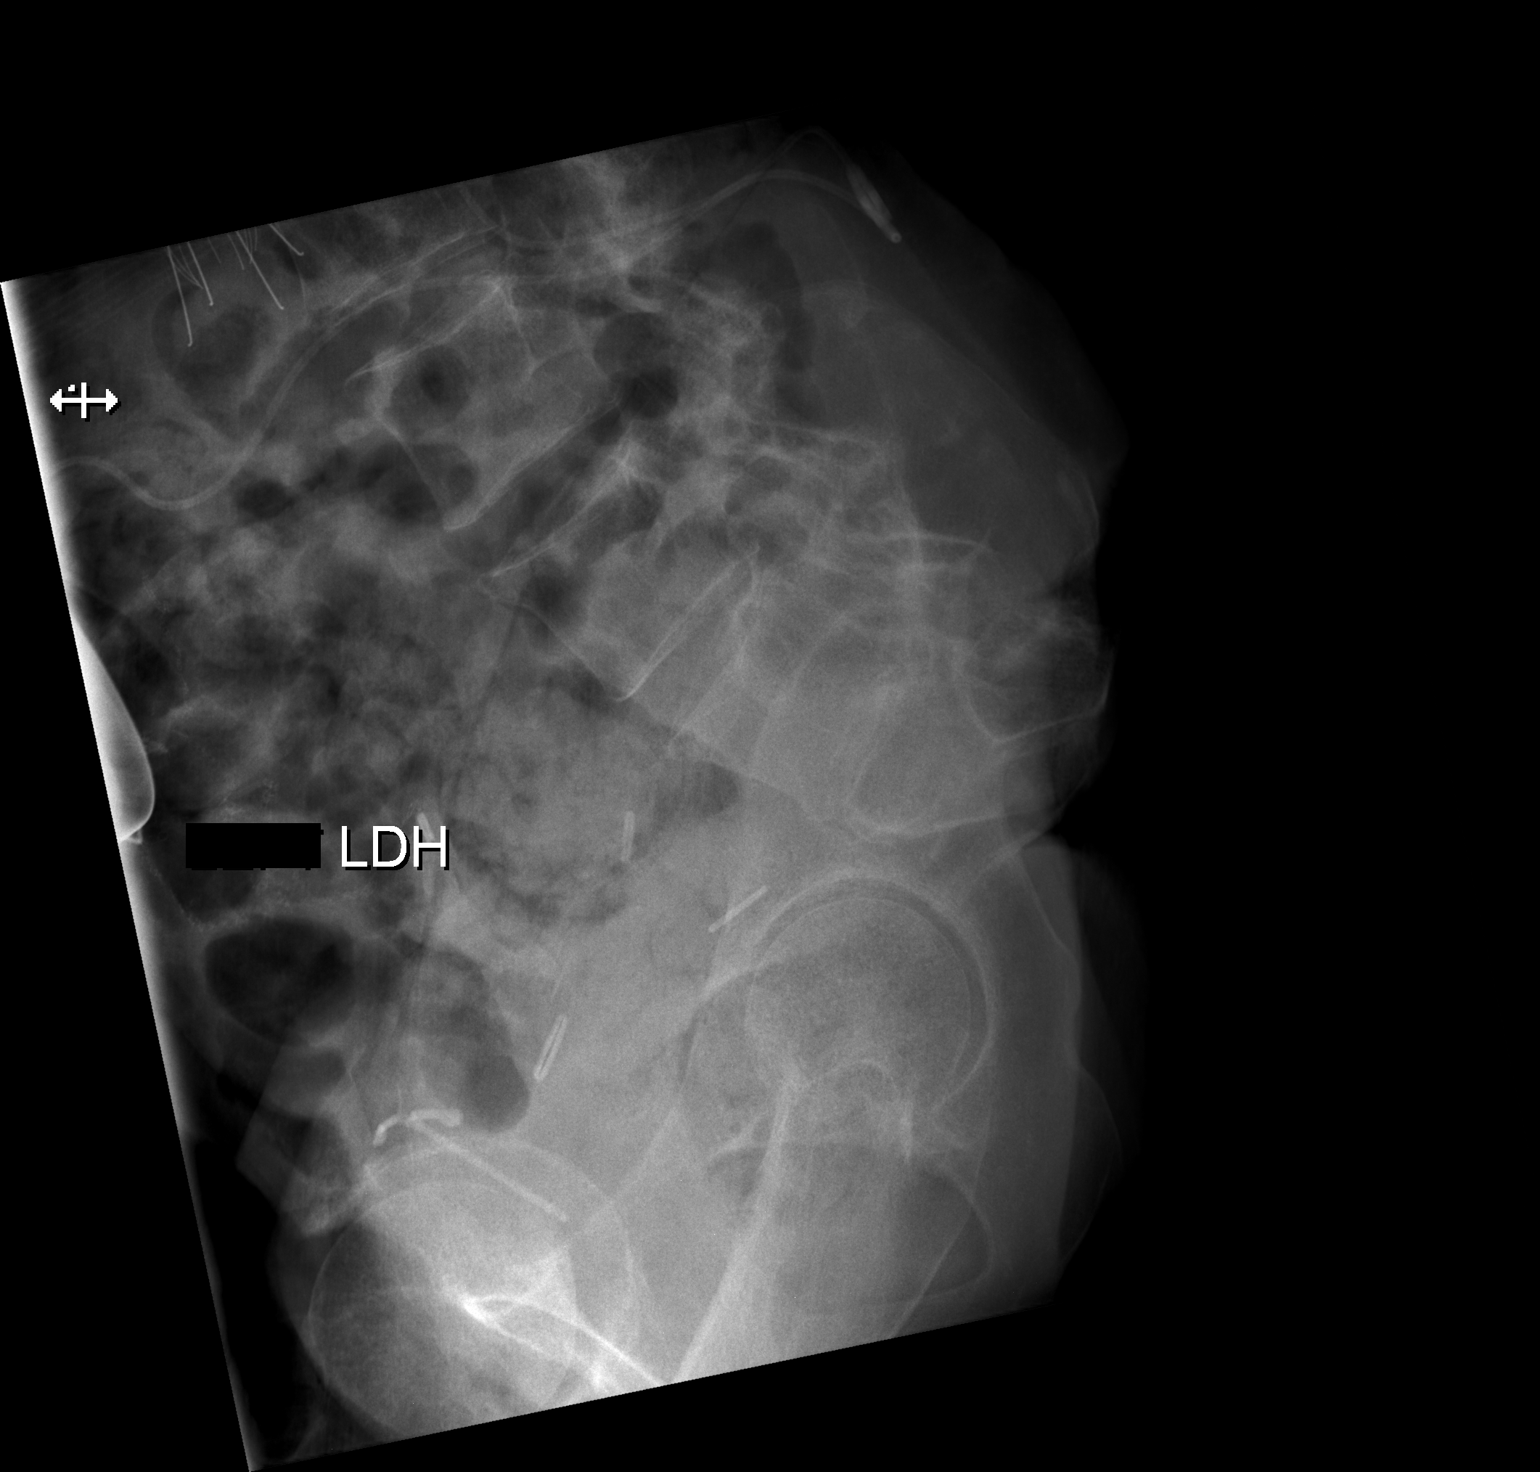

[3 of 3 positions shown; findings below may reference images not displayed]

FINDINGS: There is no evidence of fracture or other focal bone lesions. Mild
osteoarthritis of bilateral hips. Large amount of stool in the
rectum. T-type IUD noted.
IMPRESSION: No acute osseous abnormality of the sacrum. If there is further
clinical concern regarding osteomyelitis of the sacrum, further
evaluation with CT is recommended.

## 2016-12-10 IMAGING — CR DG PELVIS 1-2V
1 series · 1 of 1 positions shown · non-contrast
Comparison: None.

CLINICAL DATA: C5 quadriplegia.  Sacral pain.

EXAM:
PELVIS - 1-2 VIEW

[t pelvis ap]
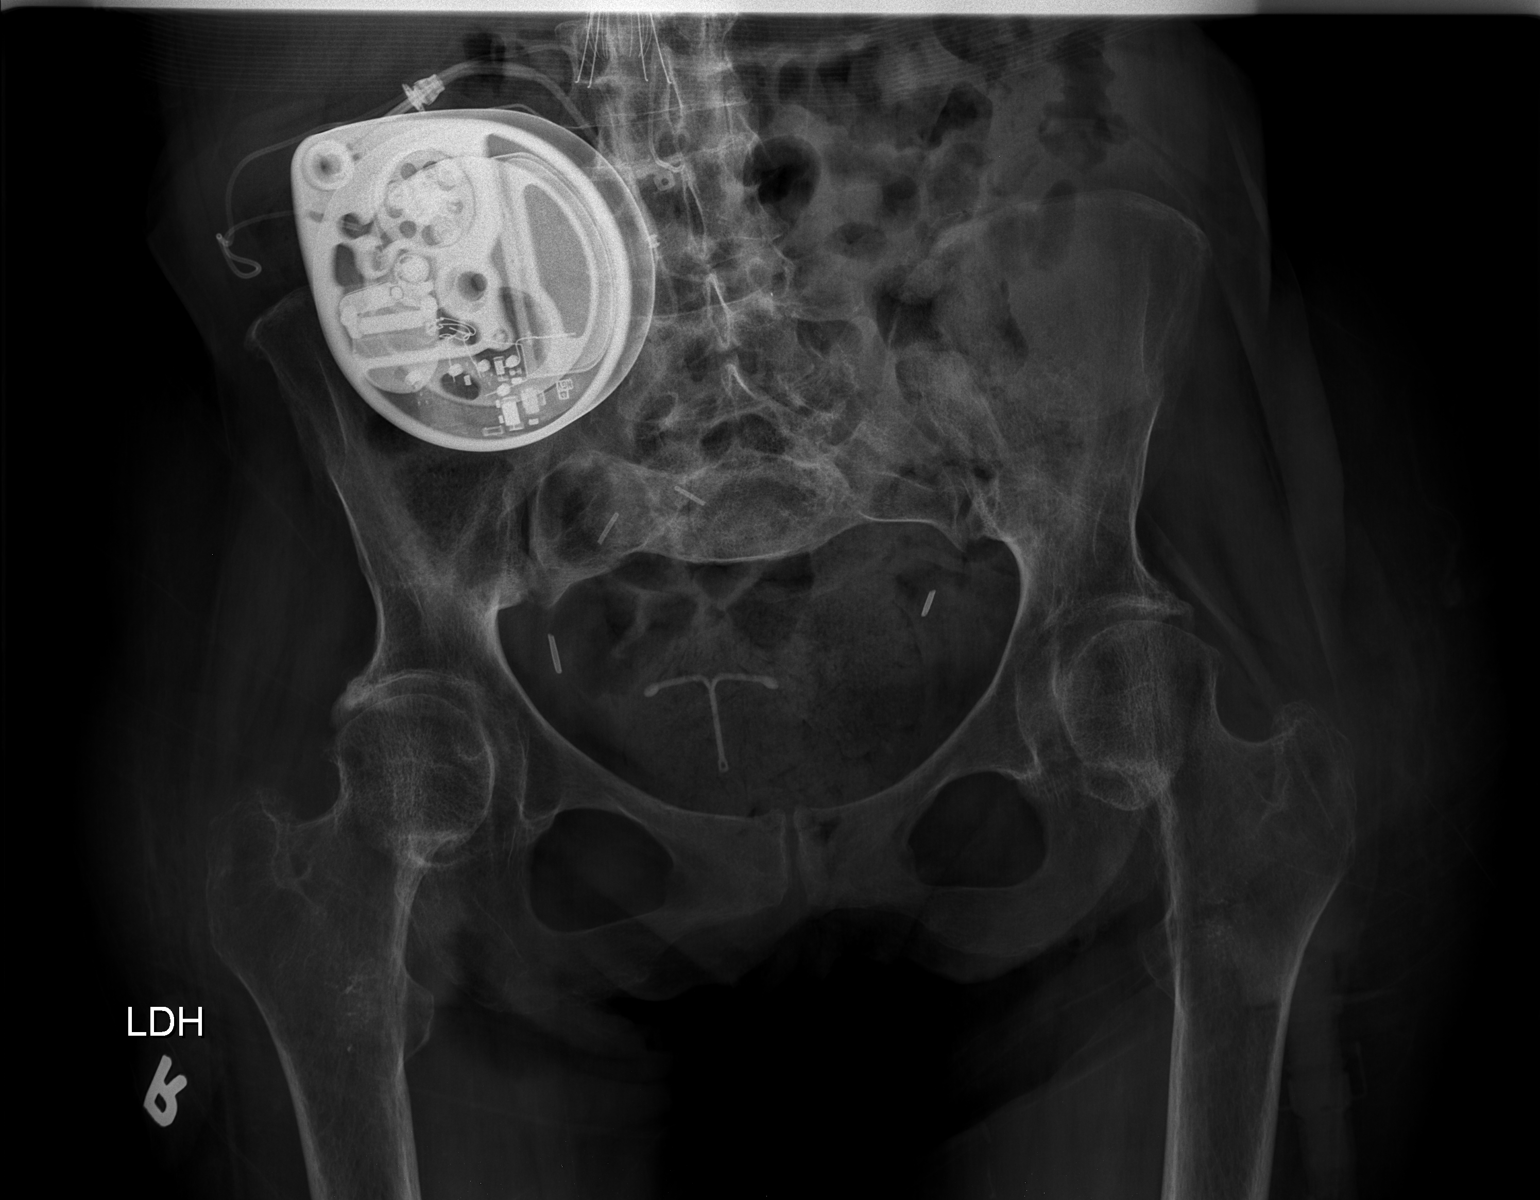

[1 of 1 positions shown; findings below may reference images not displayed]

FINDINGS: There is no evidence of pelvic fracture or diastasis. Mild
osteoarthritis of bilateral hips. No pelvic bone lesions are seen.
Large amount of stool in the rectum. T-type IUD.
IMPRESSION: No acute osseous injury of the pelvis. If there is clinical concern
regarding osteomyelitis, recommend further evaluation with CT.

## 2016-12-10 NOTE — Progress Notes (Signed)
[name], [name] ([id number]) Visit Report for 12/08/2016 Chief Complaint Document Details Patient Name: [name], [name] Date of Service: 12/08/2016 10:30 AM Medical Record Number: [id number] Patient Account Number: [id number] Date of Birth/Sex: May 24, 1966 (51 y.o. 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 She comes in for a pressure injury to the right scapular region which had healed in February and now has a reopened for the last 2 weeks Electronic Signature(s) Signed: 12/08/2016 11:03:45 AM By: Evlyn Kanner MD, FACS Entered By: Evlyn Kanner on 12/08/2016 11:03:45 [name] -------------------------------------------------------------------------------- HPI Details Patient Name: [name], [name] Date of Service: 12/08/2016 10:30 AM Medical Record Number: [id number] Patient Account Number: [id number] Date of Birth/Sex: April 23, 1966 (51 y.o. Female) Treating RN: Ashok Cordia, Debi Primary Care Provider: [place], JULIE Other Clinician: Referring Provider: [place], JULIE Treating Provider/Extender: Rudene Re in Treatment: 0 History of Present Illness Location: right scapular region Quality: Patient reports No Pain. Severity: Patient states wound (s) are getting better. Duration: Patient has had the wound for > 2 weeks prior to seeking treatment at the wound center Context: The wound would happen gradually Modifying Factors: Other treatment(s) tried include:been in hospital sick with a pneumonia and has had problems with transportation Associated Signs and Symptoms: Patient reports having:healed the wound on her sacral region HPI Description: 51 year old  patient who was been an unreliable historian says she had completely healed in February and most recently her home health noticed that the wound on the right scapular area had reopened. She was in hospital about a month ago with pneumonia at that time a workup was done. We have no notes today but will workup the electronic medical record. on review of her records electronically I do not find any recent hospital MRI in the last 6 months. She had a CT scan of the chest in February 2018 which does not show any osseous lesions. she was admitted to the hospital between May 14 and May 17 for 3 days for mucus plugging and collapse of the left lung. at that time she had a decubitus ulcer of the right scapular region stage III. her history was noted to have quadriplegia C5-C7 incomplete, neurogenic bladder, nephrostomy catheter and emphysema. patient tells me that she has not smoked for last month and a half but as per her hospital records she was smoking in the middle of May. ======= Old notes 51 year old patient was had quadriparesis for the last 9 years and has had previous extensive surgery and reconstruction at [facility]. For about 4-5 months she's had a decubitus ulcer on the right scapular region. she was lost to follow-up for the last 6 months. she had a couple of small areas which have opened out in the region of previous scar tissue in the sacral region but these haveall healed now. She does not have any other significant comorbidities. She has a good Roho cushion for her wheelchair and she has a specialized mattress for sleep. She has restarted smoking and smokes about half a pack of cigarettes a day 02/18/2015 -- X-ray of the pelvis -- IMPRESSION:No acute osseous injury of the pelvis. If there is clinical concern regarding osteomyelitis, recommend further evaluation with CT. X-ray of the sacrum and coccyx IMPRESSION:No acute osseous abnormality of the sacrum. If  there  is further clinical concern regarding osteomyelitis of the sacrum, further evaluation with CT is recommended. ========= Electronic Signature(s) Signed: 12/08/2016 11:12:58 AM By: Evlyn Kanner MD, FACS Previous Signature: 12/08/2016 11:05:12 AM Version By: Evlyn Kanner MD, FACS [name] ([id number]) Entered By: Evlyn Kanner on 12/08/2016 11:12:58 [name] ([id number]) -------------------------------------------------------------------------------- Physical Exam Details Patient Name: [name], [name] Date of Service: 12/08/2016 10:30 AM Medical Record Number: [id number] Patient Account Number: [id number] Date of Birth/Sex: 03-10-1966 (51 y.o. Female) Treating RN: Ashok Cordia, Debi Primary Care Provider: [place], JULIE Other Clinician: Referring Provider: [place], JULIE Treating Provider/Extender: Rudene Re in Treatment: 0 Constitutional . Pulse regular. Respirations normal and unlabored. Afebrile. . Eyes Nonicteric. Reactive to light. Ears, Nose, Mouth, and Throat Lips, teeth, and gums WNL.Marland Kitchen Moist mucosa without lesions. Neck supple and nontender. No palpable supraclavicular or cervical adenopathy. Normal sized without goiter. Respiratory WNL. No retractions.. Cardiovascular Pedal Pulses WNL. No clubbing, cyanosis or edema. Chest Breasts symmetical and no nipple discharge.. Breast tissue WNL, no masses, lumps, or tenderness.. Gastrointestinal (GI) Abdomen without masses or tenderness.. No liver or spleen enlargement or tenderness.. Lymphatic No adneopathy. No adenopathy. No adenopathy. Musculoskeletal Adexa without tenderness or enlargement.. Digits and nails w/o clubbing, cyanosis, infection, petechiae, ischemia, or inflammatory conditions.. Integumentary (Hair, Skin) No suspicious lesions. No crepitus or fluctuance. No peri-wound warmth or erythema. No masses.Marland Kitchen Psychiatric Judgement and insight Intact.. No evidence of depression, anxiety, or  agitation.. Notes right scapular region has a fairly large ulcerated area with undermining and does not probe down to bone. No sharp debridement was required today. Is a stage III decubitus ulcer Electronic Signature(s) Signed: 12/08/2016 11:13:33 AM By: Evlyn Kanner MD, FACS Entered By: Evlyn Kanner on 12/08/2016 11:13:32 Strawder, Balinda [name] ([id number]) -------------------------------------------------------------------------------- Physician Orders Details Patient Name: [name], [name] Date of Service: 12/08/2016 10:30 AM Medical Record Number: [id number] Patient Account Number: [id number] Date of Birth/Sex: October 06, 1965 (51 y.o. Female) Treating RN: Ashok Cordia, Debi Primary Care Provider: [place], JULIE Other Clinician: Referring Provider: [place], JULIE Treating Provider/Extender: Rudene Re in Treatment: 0 Verbal / Phone Orders: Yes Clinician: Pinkerton, Debi Read Back and Verified: Yes Diagnosis Coding Wound Cleansing Wound #4 Right Scapula o Clean wound with Normal Saline. o Clean wound with wound cleanser. o May Shower, gently pat wound dry prior to applying new dressing. Anesthetic Wound #4 Right Scapula o Topical Lidocaine 4% cream applied to wound bed prior to debridement - for clinic use Skin Barriers/Peri-Wound Care Wound #4 Right Scapula o Skin Prep Primary Wound Dressing Wound #4 Right Scapula o Hydrafera Blue Secondary Dressing Wound #4 Right Scapula o Dry Gauze o Boardered Foam Dressing Dressing Change Frequency Wound #4 Right Scapula o Change dressing every other day. Follow-up Appointments Wound #4 Right Scapula o Return Appointment in 1 week. Off-Loading Wound #4 Right Scapula o Turn and reposition every 2 hours Beggs, [name] ([id number]) Additional Orders / Instructions Wound #4 Right Scapula o Increase protein intake. Home Health Wound #4 Right Scapula o Continue Home Health Visits - [facility] o Home  Health Nurse may visit PRN to address patientos wound care needs. o FACE TO FACE ENCOUNTER: MEDICARE and MEDICAID PATIENTS: I certify that this patient is under my care and that I had a face-to-face encounter that meets the physician face-to-face encounter requirements with this patient on this date. The encounter with the patient was in whole or in part for the following MEDICAL CONDITION: (primary reason for Home Healthcare) MEDICAL NECESSITY: I certify, that based on my  findings, NURSING services are a medically necessary home health service. HOME BOUND STATUS: I certify that my clinical findings support that this patient is homebound (i.e., Due to illness or injury, pt requires aid of supportive devices such as crutches, cane, wheelchairs, walkers, the use of special transportation or the assistance of another person to leave their place of residence. There is a normal inability to leave the home and doing so requires considerable and taxing effort. Other absences are for medical reasons / religious services and are infrequent or of short duration when for other reasons). o If current dressing causes regression in wound condition, may D/C ordered dressing product/s and apply Normal Saline Moist Dressing daily until next Wound Healing Center / Other MD appointment. Notify Wound Healing Center of regression in wound condition at [phone]. o Please direct any NON-WOUND related issues/requests for orders to patient's Primary Care Physician Electronic Signature(s) Signed: 12/08/2016 4:12:58 PM By: Evlyn KannerBritto, Emilija Bohman MD, FACS Signed: 12/08/2016 4:22:44 PM By: Alejandro MullingPinkerton, Debra Entered By: Alejandro MullingPinkerton, Debra on 12/08/2016 10:46:10 Hedden, [name] ([id number]) -------------------------------------------------------------------------------- Problem List Details Patient Name: [name] MastersWADE, Evalynn M. Date of Service: 12/08/2016 10:30 AM Medical Record Number: [id number] Patient Account Number:  [id number] Date of Birth/Sex: 01/04/1966 (51 y.o. Female) Treating RN: Ashok CordiaPinkerton, Debi Primary Care Provider: [place], JULIE Other Clinician: Referring Provider: [place], JULIE Treating Provider/Extender: Rudene ReBritto, Safia Panzer Weeks in Treatment: 0 Active Problems ICD-10 Encounter Code Description Active Date Diagnosis G82.52 Quadriplegia, C1-C4 incomplete 12/08/2016 Yes E44.0 Moderate protein-calorie malnutrition 12/08/2016 Yes L89.113 Pressure ulcer of right upper back, stage 3 12/08/2016 Yes Inactive Problems Resolved Problems Electronic Signature(s) Signed: 12/08/2016 11:02:50 AM By: Evlyn KannerBritto, Khala Tarte MD, FACS Entered By: Evlyn KannerBritto, Christiano Blandon on 12/08/2016 11:02:50 Dannemiller, [name] ([id number]) -------------------------------------------------------------------------------- Progress Note Details Patient Name: [name] MastersWADE, [name] Date of Service: 12/08/2016 10:30 AM Medical Record Number: [id number] Patient Account Number: [id number] Date of Birth/Sex: 01/04/1966 (51 y.o. 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 She comes in for a pressure injury to the right scapular region which had healed in February and now has a reopened for the last 2 weeks History of Present Illness (HPI) The following HPI elements were documented for the patient's wound: Location: right scapular region Quality: Patient reports No Pain. Severity: Patient states wound (s) are getting better. Duration: Patient has had the wound for > 2 weeks prior to seeking treatment at the wound center Context: The wound would happen gradually Modifying Factors: Other treatment(s) tried include:been in hospital sick with a pneumonia and  has had problems with transportation Associated Signs and Symptoms: Patient reports having:healed the wound on her sacral region 51 year old patient who was been an unreliable historian says she had completely healed in February and most recently her home health noticed that the wound on the right scapular area had reopened. She was in hospital about a month ago with pneumonia at that time a workup was done. We have no notes today but will workup the electronic medical record. on review of her records electronically I do not find any recent hospital MRI in the last 6 months. She had a CT scan of the chest in February 2018 which does not show any osseous lesions. she was admitted to the hospital between May 14 and May 17 for 3 days for mucus  plugging and collapse of the left lung. at that time she had a decubitus ulcer of the right scapular region stage III. her history was noted to have quadriplegia C5-C7 incomplete, neurogenic bladder, nephrostomy catheter and emphysema. patient tells me that she has not smoked for last month and a half but as per her hospital records she was smoking in the middle of May. 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 For about 4-5 months she's had a decubitus ulcer on the right scapular region. she was lost to follow-up for the last 6 months. she had a couple of small areas which have opened out in the region of previous scar tissue in the sacral region but these haveall healed now. She does not have any other significant comorbidities. She has a good Roho cushion for her wheelchair and she has a specialized mattress for sleep. She has restarted smoking and smokes about half a pack of cigarettes a day [name], [name] ([id number]) 02/18/2015 -- X-ray of the pelvis -- IMPRESSION:No acute osseous injury of the pelvis. If there is  clinical concern regarding osteomyelitis, recommend further evaluation with CT. X-ray of the sacrum and coccyx IMPRESSION:No acute osseous abnormality of the sacrum. If there is further clinical concern regarding osteomyelitis of the sacrum, further evaluation with CT is recommended. ========= Wound History Patient presents with 1 open wound that has been present for approximately 11/07/16. Patient has been treating wound in the following manner: silver alginate. Laboratory tests have not been performed in the last month. Patient reportedly has tested positive for an antibiotic resistant organism. Patient reportedly has not tested positive for osteomyelitis. Patient reportedly has not had testing performed to evaluate circulation in the legs. Patient experiences the following problems associated with their wounds: swelling. Patient History Information obtained from Patient. Allergies Shellfish Containing Products Family History Cancer - Mother, Diabetes - Siblings, Hypertension - Siblings, No family history of Heart Disease, Hereditary Spherocytosis, Kidney Disease, Lung Disease, Seizures, Stroke, Thyroid Problems, Tuberculosis. Social History Current every day smoker, Marital Status - Separated, Alcohol Use - Never, Drug Use - No History, Caffeine Use - Never. Medical History Hospitalization/Surgery History - 05/13/2014, [facility], collapsed lung and lung congestion. Medical And Surgical History Notes Genitourinary urostomy Musculoskeletal right arm contracture Review of Systems (ROS) Eyes The patient has no complaints or symptoms. Ear/Nose/Mouth/Throat The patient has no complaints or symptoms. Hematologic/Lymphatic The patient has no complaints or symptoms. Cardiovascular The patient has no complaints or symptoms. Gastrointestinal [name], [name] ([id number]) The patient has no complaints or symptoms. Genitourinary Complains or has symptoms of Incontinence/dribbling -  urostomy. Immunological The patient has no complaints or symptoms. Integumentary (Skin) Complains or has symptoms of Wounds. Musculoskeletal The patient has no complaints or symptoms. Oncologic The patient has no complaints or symptoms. Psychiatric The patient has no complaints or symptoms. Medications: zinc sulfate, Senokot, multivitamins, Remeron, Robitussin, Tylenol, MiraLAX. Objective Constitutional Pulse regular. Respirations normal and unlabored. Afebrile. Vitals Time Taken: 10:13 AM, Height: 63 in, Source: Stated, Weight: 93 lbs, Source: Stated, BMI: 16.5, Temperature: 97.8 F, Pulse: 78 bpm, Respiratory Rate: 18 breaths/min, Blood Pressure: 110/79 mmHg. Eyes Nonicteric. Reactive to light. Ears, Nose, Mouth, and Throat Lips, teeth, and gums WNL.Marland Kitchen Moist mucosa without lesions. Neck supple and nontender. No palpable supraclavicular or cervical adenopathy. Normal sized without goiter. Respiratory WNL. No retractions.. Cardiovascular Pedal Pulses WNL. No clubbing, cyanosis or edema. Chest Breasts symmetical  and no nipple discharge.. Breast tissue WNL, no masses, lumps, or tenderness.. Gastrointestinal (GI) Bora, Kimari M. ([id number]) Abdomen without masses or tenderness.. No liver or spleen enlargement or tenderness.. Lymphatic No adneopathy. No adenopathy. No adenopathy. Musculoskeletal Adexa without tenderness or enlargement.. Digits and nails w/o clubbing, cyanosis, infection, petechiae, ischemia, or inflammatory conditions.Marland Kitchen Psychiatric Judgement and insight Intact.. No evidence of depression, anxiety, or agitation.. General Notes: right scapular region has a fairly large ulcerated area with undermining and does not probe down to bone. No sharp debridement was required today. Is a stage III decubitus ulcer Integumentary (Hair, Skin) No suspicious lesions. No crepitus or fluctuance. No peri-wound warmth or erythema. No masses.. Wound #4 status is Open. Original  cause of wound was Pressure Injury. The wound is located on the Right Scapula. The wound measures 2cm length x 1cm width x 0.3cm depth; 1.571cm^2 area and 0.471cm^3 volume. There is no tunneling or undermining noted. There is a large amount of serous drainage noted. The wound margin is distinct with the outline attached to the wound base. There is large (67-100%) red, pink granulation within the wound bed. There is a small (1-33%) amount of necrotic tissue within the wound bed including Adherent Slough. Periwound temperature was noted as No Abnormality. The periwound has tenderness on palpation. Assessment Active Problems ICD-10 G82.52 - Quadriplegia, C1-C4 incomplete E44.0 - Moderate protein-calorie malnutrition L89.113 - Pressure ulcer of right upper back, stage 43 this 51 year old patient who has quadriplegia and has been in and out of hospital for pulmonary problems has redeveloped a right scapular stage III pressure ulcer in the last month. After review of recommended: 1. Hydrofera Blue to be packed into the wound and changed every other day 2. Offloading has been discussed with her in great detail 3. Appropriate protein supplements, vitamin 8, vitamin C and zinc Clock, Campbell M. ([id number]) 4. Regular visits to the wound center 5. Commended her on giving up smoking. Plan Wound Cleansing: Wound #4 Right Scapula: Clean wound with Normal Saline. Clean wound with wound cleanser. May Shower, gently pat wound dry prior to applying new dressing. Anesthetic: Wound #4 Right Scapula: Topical Lidocaine 4% cream applied to wound bed prior to debridement - for clinic use Skin Barriers/Peri-Wound Care: Wound #4 Right Scapula: Skin Prep Primary Wound Dressing: Wound #4 Right Scapula: Hydrafera Blue Secondary Dressing: Wound #4 Right Scapula: Dry Gauze Boardered Foam Dressing Dressing Change Frequency: Wound #4 Right Scapula: Change dressing every other day. Follow-up  Appointments: Wound #4 Right Scapula: Return Appointment in 1 week. Off-Loading: Wound #4 Right Scapula: Turn and reposition every 2 hours Additional Orders / Instructions: Wound #4 Right Scapula: Increase protein intake. Home Health: Wound #4 Right Scapula: Continue Home Health Visits - [facility] Nurse may visit PRN to address patient s wound care needs. FACE TO FACE ENCOUNTER: MEDICARE and MEDICAID PATIENTS: I certify that this patient is under my care and that I had a face-to-face encounter that meets the physician face-to-face encounter requirements with this patient on this date. The encounter with the patient was in whole or in part for the following MEDICAL CONDITION: (primary reason for Home Healthcare) MEDICAL NECESSITY: I certify, that based on my findings, NURSING services are a medically necessary home health service. HOME BOUND STATUS: I certify that my clinical findings support that this patient is homebound (i.e., Due to illness or injury, pt requires aid of supportive devices such as crutches, cane, wheelchairs, walkers, the use of special transportation or the assistance of another person  to leave their place of residence. There is a normal inability to leave the home and doing so requires considerable and taxing effort. Other absences are for medical reasons / religious services and are infrequent or of short duration when for other reasons). If current dressing causes regression in wound condition, may D/C ordered dressing product/s and apply [name], [name] ([id number]) Normal Saline Moist Dressing daily until next Wound Healing Center / Other MD appointment. Notify Wound Healing Center of regression in wound condition at [phone]. Please direct any NON-WOUND related issues/requests for orders to patient's Primary Care Physician this 51 year old patient who has quadriplegia and has been in and out of hospital for pulmonary problems has redeveloped a  right scapular stage III pressure ulcer in the last month. After review of recommended: 1. Hydrofera Blue to be packed into the wound and changed every other day 2. Offloading has been discussed with her in great detail 3. Appropriate protein supplements, vitamin 8, vitamin C and zinc 4. Regular visits to the wound center 5. Commended her on giving up smoking. Electronic Signature(s) Signed: 12/08/2016 11:16:39 AM By: Evlyn Kanner MD, FACS Entered By: Evlyn Kanner on 12/08/2016 11:16:39 [name] ([id number]) -------------------------------------------------------------------------------- ROS/PFSH Details Patient Name: KELLIN, FIFER. Date of Service: 12/08/2016 10:30 AM Medical Record Number: [id number] Patient Account Number: [id number] Date of Birth/Sex: [birth year] (51 y.o. Female) Treating RN: Ashok Cordia, Debi Primary Care Provider: [place], JULIE Other Clinician: Referring Provider: [place], JULIE Treating Provider/Extender: Rudene Re in Treatment: 0 Information Obtained From Patient Wound History Do you currently have one or more open woundso Yes How many open wounds do you currently haveo 1 Approximately how long have you had your woundso 11/07/16 How have you been treating your wound(s) until nowo silver alginate Has your wound(s) ever healed and then re-openedo No Have you had any lab work done in the past montho No Have you tested positive for an antibiotic resistant organism (MRSA, VRE)o Yes Date: 12/09/2015 Have you tested positive for osteomyelitis (bone infection)o No Have you had any tests for circulation on your legso No Have you had other problems associated with your woundso Swelling Genitourinary Complaints and Symptoms: Positive for: Incontinence/dribbling - urostomy Medical History: Negative for: End Stage Renal Disease Past Medical History Notes: urostomy Integumentary (Skin) Complaints and Symptoms: Positive for: Wounds Medical  History: Positive for: History of pressure wounds Negative for: History of Burn Eyes Complaints and Symptoms: No Complaints or Symptoms Medical History: Negative for: Cataracts; Glaucoma; Optic Neuritis [name], [name] ([id number]) Ear/Nose/Mouth/Throat Complaints and Symptoms: No Complaints or Symptoms Medical History: Negative for: Chronic sinus problems/congestion; Middle ear problems Hematologic/Lymphatic Complaints and Symptoms: No Complaints or Symptoms Medical History: Negative for: Anemia; Hemophilia; Human Immunodeficiency Virus; Lymphedema; Sickle Cell Disease Respiratory Medical History: Positive for: Asthma; Chronic Obstructive Pulmonary Disease (COPD) Cardiovascular Complaints and Symptoms: No Complaints or Symptoms Medical History: Negative for: Angina; Arrhythmia; Congestive Heart Failure; Coronary Artery Disease; Deep Vein Thrombosis; Hypertension; Hypotension; Myocardial Infarction; Peripheral Arterial Disease; Peripheral Venous Disease; Phlebitis; Vasculitis Gastrointestinal Complaints and Symptoms: No Complaints or Symptoms Medical History: Negative for: Cirrhosis ; Colitis; Crohnos; Hepatitis A; Hepatitis B; Hepatitis C Endocrine Medical History: Negative for: Type I Diabetes; Type II Diabetes Immunological Complaints and Symptoms: No Complaints or Symptoms Medical History: Negative for: Lupus Erythematosus; Raynaudos; Scleroderma [name], [name] ([id number]) Musculoskeletal Complaints and Symptoms: No Complaints or Symptoms Medical History: Negative for: Gout; Rheumatoid Arthritis; Osteoarthritis; Osteomyelitis Past Medical History Notes: right arm contracture Neurologic Medical History: Positive for: Paraplegia -  C 3 paraplegia Oncologic Complaints and Symptoms: No Complaints or Symptoms Medical History: Negative for: Received Chemotherapy; Received Radiation Psychiatric Complaints and Symptoms: No Complaints or  Symptoms Immunizations Pneumococcal Vaccine: Received Pneumococcal Vaccination: Yes Hospitalization / Surgery History Name of Hospital Purpose of Hospitalization/Surgery Date [facility] collapsed lung and lung congestion 05/13/2014 Family and Social History Cancer: Yes - Mother; Diabetes: Yes - Siblings; Heart Disease: No; Hereditary Spherocytosis: No; Hypertension: Yes - Siblings; Kidney Disease: No; Lung Disease: No; Seizures: No; Stroke: No; Thyroid Problems: No; Tuberculosis: No; Current every day smoker; Marital Status - Separated; Alcohol Use: Never; Drug Use: No History; Caffeine Use: Never; Financial Concerns: No; Food, Clothing or Shelter Needs: No; Support System Lacking: No; Transportation Concerns: No; Advanced Directives: No; Patient does not want information on Advanced Directives Physician Affirmation I have reviewed and agree with the above information. Electronic Signature(s) Signed: 12/08/2016 4:12:58 PM By: Evlyn Kanner MD, FACS Signed: 12/08/2016 4:22:44 PM By: Alejandro Mulling Entered By: Evlyn Kanner on 12/08/2016 11:02:10 Goth, NIJAH TEJERA ([id number]) Godlewski, Balinda [name] ([id number]) -------------------------------------------------------------------------------- SuperBill Details Patient Name: SHIRIN, ECHEVERRY. Date of Service: 12/08/2016 Medical Record Number: [id number] Patient Account Number: [id number] Date of Birth/Sex: [birth year] (51 y.o. 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, stage 3 Facility Procedures CPT4 Code: [id number] Description: 99214 - WOUND CARE VISIT-LEV 4 EST PT Modifier: Quantity: 1 Physician Procedures CPT4 Code:  [id number] Description: 99214 - WC PHYS LEVEL 4 - EST PT ICD-10 Description Diagnosis 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

## 2016-12-10 NOTE — Progress Notes (Signed)
[name], Latonja M. ([id number]) Visit Report for 12/08/2016 Abuse/Suicide Risk Screen Details Patient Name: [name], [name] Date of Service: 12/08/2016 10:30 AM Medical Record Number: [id number] Patient Account Number: [id number] Date of Birth/Sex: Apr 30, 1966 (51 y.o. Female) Treating RN: Ashok CordiaPinkerton, Debi Primary Care Makai Dumond: [place], JULIE Other Clinician: Referring Ishani Goldwasser: [place], JULIE Treating Raja Liska/Extender: Rudene ReBritto, [name] Weeks in Treatment: 0 Abuse/Suicide Risk Screen Items Answer ABUSE/SUICIDE RISK SCREEN: Has anyone close to you tried to hurt or harm you recentlyo No Do you feel uncomfortable with anyone in your familyo No Has anyone forced you do things that you didnot want to doo No Do you have any thoughts of harming yourselfo No Patient displays signs or symptoms of abuse and/or neglect. No Electronic Signature(s) Signed: 12/08/2016 4:22:44 PM By: Alejandro MullingPinkerton, Debra Entered By: Alejandro MullingPinkerton, Debra on 12/08/2016 10:16:34 Flitton, [name] ([id number]) -------------------------------------------------------------------------------- Activities of Daily Living Details Patient Name: [name], [name] Date of Service: 12/08/2016 10:30 AM Medical Record Number: [id number] Patient Account Number: [id number] Date of Birth/Sex: Apr 30, 1966 (51 y.o. Female) Treating RN: Ashok CordiaPinkerton, Debi Primary Care Takeila Thayne: [place], JULIE Other Clinician: Referring Conner Muegge: [place], JULIE Treating Abdikadir Fohl/Extender: Rudene ReBritto, [name] Weeks in Treatment: 0 Activities of Daily Living Items Answer Activities of Daily Living (Please select one for each item) Drive Automobile Not Able Take Medications Need Assistance Use Telephone Need Assistance Care for Appearance Not Able Use Toilet Not Able Bath / Shower Not Able Dress Self Not Able Feed Self Need Assistance Walk Not Able Get In / Out Bed Not Able Housework Not Able Prepare Meals Not Able Handle Money Not Able Shop for Self Not Able Electronic  Signature(s) Signed: 12/08/2016 4:22:44 PM By: Alejandro MullingPinkerton, Debra Entered By: Alejandro MullingPinkerton, Debra on 12/08/2016 10:17:17 Sciortino, [name] ([id number]) -------------------------------------------------------------------------------- Education Assessment Details Patient Name: [name], [name] Date of Service: 12/08/2016 10:30 AM Medical Record Number: [id number] Patient Account Number: [id number] Date of Birth/Sex: Apr 30, 1966 (51 y.o. Female) Treating RN: Ashok CordiaPinkerton, Debi Primary Care Aadit Hagood: [place], JULIE Other Clinician: Referring Jesiah Yerby: [place], JULIE Treating Taralyn Ferraiolo/Extender: Rudene ReBritto, [name] Weeks in Treatment: 0 Primary Learner Assessed: Patient Learning Preferences/Education Level/Primary Language Learning Preference: Explanation, Printed Material Highest Education Level: High School Preferred Language: English Cognitive Barrier Assessment/Beliefs Language Barrier: No Translator Needed: No Memory Deficit: No Emotional Barrier: No Cultural/Religious Beliefs Affecting Medical No Care: Physical Barrier Assessment Impaired Vision: No Impaired Hearing: No Decreased Hand dexterity: No Knowledge/Comprehension Assessment Knowledge Level: Medium Comprehension Level: Medium Ability to understand written Medium instructions: Ability to understand verbal Medium instructions: Motivation Assessment Anxiety Level: Calm Cooperation: Cooperative Education Importance: Acknowledges Need Interest in Health Problems: Asks Questions Perception: Coherent Willingness to Engage in Self- Medium Management Activities: Readiness to Engage in Self- Medium Management Activities: Electronic Signature(s) [name], Ileana M. ([id number]) Signed: 12/08/2016 4:22:44 PM By: Alejandro MullingPinkerton, Debra Entered By: Alejandro MullingPinkerton, Debra on 12/08/2016 10:17:39 Mcchesney, [name] ([id number]) -------------------------------------------------------------------------------- Fall Risk Assessment Details Patient Name:  [name], [name] Date of Service: 12/08/2016 10:30 AM Medical Record Number: [id number] Patient Account Number: [id number] Date of Birth/Sex: Apr 30, 1966 (51 y.o. Female) Treating RN: Ashok CordiaPinkerton, Debi Primary Care Promise Weldin: [place], JULIE Other Clinician: Referring Japheth Diekman: [place], JULIE Treating Kenika Sahm/Extender: Rudene ReBritto, [name] Weeks in Treatment: 0 Fall Risk Assessment Items Have you had 2 or more falls in the last 12 monthso 0 No Have you had any fall that resulted in injury in the last 12 monthso 0 No FALL RISK ASSESSMENT: History of falling - immediate or within 3 months 0 No Secondary diagnosis 15 Yes Ambulatory aid None/bed rest/wheelchair/nurse 0 Yes Crutches/cane/walker 0 No Furniture  0 No IV Access/Saline Lock 0 No Gait/Training Normal/bed rest/immobile 0 No Weak 0 No Impaired 20 Yes Mental Status Oriented to own ability 0 Yes Electronic Signature(s) Signed: 12/08/2016 4:22:44 PM By: Alejandro Mulling Entered By: Alejandro Mulling on 12/08/2016 10:17:53 Larcom, [name] ([id number]) -------------------------------------------------------------------------------- Foot Assessment Details Patient Name: [name] Date of Service: 12/08/2016 10:30 AM Medical Record Number: [id number] Patient Account Number: [id number] Date of Birth/Sex: 1966/02/14 (51 y.o. Female) Treating RN: Ashok Cordia, Debi Primary Care Kazumi Lachney: [place], JULIE Other Clinician: Referring Sumayya Muha: [place], JULIE Treating Macky Galik/Extender: Rudene Re in Treatment: 0 Foot Assessment Items Site Locations + = Sensation present, - = Sensation absent, C = Callus, U = Ulcer R = Redness, W = Warmth, M = Maceration, PU = Pre-ulcerative lesion F = Fissure, S = Swelling, D = Dryness Assessment Right: Left: Other Deformity: No No Prior Foot Ulcer: No No Prior Amputation: No No Charcot Joint: No No Ambulatory Status: Gait: Electronic Signature(s) Signed: 12/08/2016 4:22:44 PM By: Alejandro Mulling Entered By: Alejandro Mulling on 12/08/2016 10:18:11 Vieau, [name] ([id number]) -------------------------------------------------------------------------------- Nutrition Risk Assessment Details Patient Name: [name] Masters. Date of Service: 12/08/2016 10:30 AM Medical Record Number: [id number] Patient Account Number: [id number] Date of Birth/Sex: [birth year] (51 y.o. Female) Treating RN: Ashok Cordia, Debi Primary Care Lorrin Nawrot: [place], JULIE Other Clinician: Referring Javayah Magaw: [place], JULIE Treating Avrian Delfavero/Extender: Rudene Re in Treatment: 0 Height (in): 63 Weight (lbs): 93 Body Mass Index (BMI): 16.5 Nutrition Risk Assessment Items NUTRITION RISK SCREEN: I have an illness or condition that made me change the kind and/or 2 Yes amount of food I eat I eat fewer than two meals per day 0 No I eat few fruits and vegetables, or milk products 0 No I have three or more drinks of beer, liquor or wine almost every day 0 No I have tooth or mouth problems that make it hard for me to eat 0 No I don't always have enough money to buy the food I need 0 No I eat alone most of the time 0 No I take three or more different prescribed or over-the-counter drugs a 1 Yes day Without wanting to, I have lost or gained 10 pounds in the last six 0 No months I am not always physically able to shop, cook and/or feed myself 0 No Nutrition Protocols Good Risk Protocol Moderate Risk Protocol Electronic Signature(s) Signed: 12/08/2016 4:22:44 PM By: Alejandro Mulling Entered By: Alejandro Mulling on 12/08/2016 10:18:03

## 2016-12-10 NOTE — Progress Notes (Signed)
[name], Deriyah M. ([id number]) Visit Report for 12/08/2016 Allergy List Details Patient Name: [name], [name] Date of Service: 12/08/2016 10:30 AM Medical Record Number: [id number] Patient Account Number: [id number] Date of Birth/Sex: 04-18-1966 (51 y.o. Female) Treating RN: [name] Primary Care [name]: [place], [name] Other Clinician: Referring [name]: [place], [name] Treating [name]/Extender: [name], [name]: 0 Allergies Active Allergies Shellfish Containing Products Allergy Notes Electronic Signature(s) Signed: 12/08/2016 4:22:44 PM [name]: [name]: [name] on 12/08/2016 10:14:27 Iezzi, [name] ([id number]) -------------------------------------------------------------------------------- Arrival Information Details Patient Name: [name], [name] Date of Service: 12/08/2016 10:30 AM Medical Record Number: [id number] Patient Account Number: [id number] Date of Birth/Sex: 04-18-1966 (51 y.o. Female) Treating RN: [name] Primary Care [name]: [place], [name] Other Clinician: Referring Kelechi Astarita: [place], [name] Treating Aadil Sur/Extender: [name], [name]: 0 Visit Information Patient Arrived: Wheel Chair Arrival Time: 10:11 Accompanied [name]: partner Transfer Assistance: Other Patient Identification Verified: Yes Secondary Verification Process Yes Completed: Patient Requires Transmission-Based No Precautions: Patient Has Alerts: No History Since Last Visit All ordered tests and consults were completed: No Added or deleted any medications: No Any new allergies or adverse reactions: No Had a fall or experienced change in activities of daily living that may affect risk of falls: No Signs or symptoms of abuse/neglect since last visito No Hospitalized since last visit: No Electronic Signature(s) Signed: 12/08/2016 4:22:44 PM [name]: [name]: [name] on 12/08/2016 10:13:15 Fromm,  [name] ([id number]) -------------------------------------------------------------------------------- Clinic Level of Care Assessment Details Patient Name: [name], [name] Date of Service: 12/08/2016 10:30 AM Medical Record Number: [id number] Patient Account Number: [id number] Date of Birth/Sex: 04-18-1966 (51 y.o. Female) Treating RN: [name] Primary Care Ananth Fiallos: [place], [name] Other Clinician: Referring Jennae Hakeem: [place], [name] Treating Morse Brueggemann/Extender: [name], [name]: 0 Clinic Level of Care Assessment Items TOOL 2 Quantity Score X - Use when only an EandM is performed on the INITIAL visit 1 0 ASSESSMENTS - Nursing Assessment / Reassessment X - General Physical Exam (combine w/ comprehensive assessment (listed just 1 20 below) when performed on new pt. evals) X - Comprehensive Assessment (HX, ROS, Risk Assessments, Wounds Hx, etc.) 1 25 ASSESSMENTS - Wound and Skin Assessment / Reassessment X - Simple Wound Assessment / Reassessment - one wound 1 5 []  - Complex Wound Assessment / Reassessment - multiple wounds 0 []  - Dermatologic / Skin Assessment (not related to wound area) 0 ASSESSMENTS - Ostomy and/or Continence Assessment and Care []  - Incontinence Assessment and Management 0 []  - Ostomy Care Assessment and Management (repouching, etc.) 0 PROCESS - Coordination of Care []  - Simple Patient / Family Education for ongoing care 0 X - Complex (extensive) Patient / Family Education for ongoing care 1 20 X - Staff obtains ChiropractorConsents, Records, Test Results / Process Orders 1 10 X - Staff telephones HHA, Nursing Homes / Clarify orders / etc 1 10 []  - Routine Transfer to another Facility (non-emergent condition) 0 []  - Routine Hospital Admission (non-emergent condition) 0 X - New Admissions / Manufacturing engineernsurance Authorizations / Ordering NPWT, Apligraf, etc. 1 15 []  - Emergency Hospital Admission (emergent condition) 0 X - Simple Discharge Coordination 1 10 [name],  [name] ([id number]) []  - Complex (extensive) Discharge Coordination 0 PROCESS - Special Needs []  - Pediatric / Minor Patient Management 0 []  - Isolation Patient Management 0 []  - Hearing / Language / Visual special needs 0 []  - Assessment of Community assistance (transportation, D/C planning, etc.) 0 []  - Additional assistance / Altered  mentation 0 []  - Support Surface(s) Assessment (bed, cushion, seat, etc.) 0 INTERVENTIONS - Wound Cleansing / Measurement X - Wound Imaging (photographs - any number of wounds) 1 5 []  - Wound Tracing (instead of photographs) 0 X - Simple Wound Measurement - one wound 1 5 []  - Complex Wound Measurement - multiple wounds 0 X - Simple Wound Cleansing - one wound 1 5 []  - Complex Wound Cleansing - multiple wounds 0 INTERVENTIONS - Wound Dressings X - Small Wound Dressing one or multiple wounds 1 10 []  - Medium Wound Dressing one or multiple wounds 0 []  - Large Wound Dressing one or multiple wounds 0 []  - Application of Medications - injection 0 INTERVENTIONS - Miscellaneous []  - External ear exam 0 []  - Specimen Collection (cultures, biopsies, blood, body fluids, etc.) 0 []  - Specimen(s) / Culture(s) sent or taken to Lab for analysis 0 []  - Patient Transfer (multiple staff / Nurse, adult / Similar devices) 0 []  - Simple Staple / Suture removal (25 or less) 0 []  - Complex Staple / Suture removal (26 or more) 0 [name], [name] ([id number]) []  - Hypo / Hyperglycemic Management (close monitor of Blood Glucose) 0 []  - Ankle / Brachial Index (ABI) - do not check if billed separately 0 Has the patient been seen at the hospital within the last three years: Yes Total Score: 140 Level Of Care: New/Established - Level 4 Electronic Signature(s) Signed: 12/08/2016 4:22:44 PM [name]: [name] Mulling Entered [name]: [name] Mulling on 12/08/2016 11:55:12 Michalowski, [name]  ([id number]) -------------------------------------------------------------------------------- Encounter Discharge Information Details Patient Name: [name], [name]. Date of Service: 12/08/2016 10:30 AM Medical Record Number: [id number] Patient Account Number: [id number] Date of Birth/Sex: 1965-08-30 (51 y.o. Female) Treating RN: [name] Cordia, [name] Primary Care Yetta Marceaux: [place], [name] Other Clinician: Referring Taiwan Talcott: [place], [name] Treating Manda Holstad/Extender: [name] Re in [name]: 0 Encounter Discharge Information Items Discharge Pain Level: 4 Discharge Condition: Stable Ambulatory Status: Wheelchair Discharge Destination: Home Private Transportation: Auto Accompanied [name]: partner Schedule Follow-up Appointment: Yes Medication Reconciliation completed and No provided to Patient/Care Burhan Barham: Clinical Summary of Care: Electronic Signature(s) Signed: 12/08/2016 4:22:44 PM [name]: [name] Mulling Entered [name]: [name] Mulling on 12/08/2016 10:29:49 Ertl, [name] ([id number]) -------------------------------------------------------------------------------- Lower Extremity Assessment Details Patient Name: [name] Masters. Date of Service: 12/08/2016 10:30 AM Medical Record Number: [id number] Patient Account Number: [id number] Date of Birth/Sex: 02-20-1966 (51 y.o. Female) Treating RN: [name] Cordia, [name] Primary Care Annaliz Aven: [place], [name] Other Clinician: Referring Malique Driskill: [place], [name] Treating Jeanett Antonopoulos/Extender: [name] Re in [name]: 0 Electronic Signature(s) Signed: 12/08/2016 4:22:44 PM [name]: [name] Mulling Entered [name]: [name] Mulling on 12/08/2016 10:18:20 Baena, [name] ([id number]) -------------------------------------------------------------------------------- Multi Wound Chart Details Patient Name: [name], [name]. Date of Service: 12/08/2016 10:30 AM Medical Record Number: [id number] Patient Account Number: [id number] Date of Birth/Sex:  02/28/1966 (51 y.o. 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] [N/A:N/A] Comorbid History: [4:Asthma, Chronic Obstructive Pulmonary Disease (COPD), History of pressure wounds, Paraplegia] [N/A:N/A] Date Acquired: [4:11/07/2016] [N/A:N/A] [name] of [name]: [4:0] [N/A:N/A] Wound Status: [4:Open] [N/A:N/A] Measurements L x W x D 2x1x0.3 [N/A:N/A] (cm) Area (cm) : [4:1.571] [N/A:N/A] Volume (cm) : [4:0.471] [N/A:N/A] Classification: [4:Category/Stage III] [N/A:N/A] Exudate Amount: [4:Large] [  N/A:N/A] Exudate Type: [4:Serous] [N/A:N/A] Exudate Color: [4:amber] [N/A:N/A] Wound Margin: [4:Distinct, outline attached] [N/A:N/A] Granulation Amount: [4:Large (67-100%)] [N/A:N/A] Granulation Quality: [4:Red, Pink] [N/A:N/A] Necrotic Amount: [4:Small (1-33%)] [N/A:N/A] Epithelialization: [4:None] [N/A:N/A] Periwound Skin Texture: No Abnormalities Noted [N/A:N/A] Periwound Skin [4:No Abnormalities Noted] [N/A:N/A] Moisture: Periwound Skin Color: No Abnormalities Noted [N/A:N/A] Temperature: [4:No Abnormality] [N/A:N/A] Tenderness on Yes N/A N/A Palpation: Wound Preparation: Ulcer Cleansing: N/A N/A Rinsed/Irrigated with Saline Topical Anesthetic Applied: Other: lidocaine 4% [name] Notes Wound #4 (Right Scapula) 1. Cleansed with: Clean wound with Normal Saline 2. Anesthetic Topical Lidocaine 4% cream to wound bed prior to debridement 3. Peri-wound Care: Skin Prep 4. Dressing Applied: Hydrafera  Blue 5. Secondary Dressing Applied Bordered Foam Dressing Dry Gauze Electronic Signature(s) Signed: 12/08/2016 11:02:56 AM [name]: Evlyn Kanner MD, FACS Entered [name]: Evlyn Kanner on 12/08/2016 11:02:56 [place], [name] ([id number]) -------------------------------------------------------------------------------- Multi-Disciplinary Care Plan Details Patient Name: [name], [name]. Date of Service: 12/08/2016 10:30 AM Medical Record Number: [id number] Patient Account Number: [id number] Date of Birth/Sex: 02-Jul-1966 (51 y.o. Female) Treating RN: [name] Cordia, [name] Primary Care Oriah Leinweber: [place], [name] Other Clinician: Referring Samiyyah Moffa: [place], [name] Treating Kadeisha Betsch/Extender: [name] Re in [name]: 0 Active Inactive ` Abuse / Safety / Falls / Self Care Management Nursing Diagnoses: Potential for falls Goals: Patient will not experience any injury related to falls Date Initiated: 12/08/2016 Target Resolution Date: 04/07/2017 Goal Status: Active Interventions: Assess: immobility, friction, shearing, incontinence upon admission and as needed Assess impairment of mobility on admission and as needed per policy Notes: ` Nutrition Nursing Diagnoses: Imbalanced nutrition Potential for alteratiion in Nutrition/Potential for imbalanced nutrition Goals: Patient/caregiver agrees to and verbalizes understanding of need to use nutritional supplements and/or vitamins as prescribed Date Initiated: 12/08/2016 Target Resolution Date: 04/07/2017 Goal Status: Active Interventions: Assess patient nutrition upon admission and as needed per policy Notes: ` Orientation to the Wound Care Program [name], [name] ([id number]) Nursing Diagnoses: Knowledge deficit related to the wound healing center program Goals: Patient/caregiver will verbalize understanding of the Wound Healing Center Program Date Initiated: 12/08/2016 Target Resolution Date: 01/06/2017 Goal Status:  Active Interventions: Provide education on orientation to the wound center Notes: ` Pain, Acute or Chronic Nursing Diagnoses: Pain, acute or chronic: actual or potential Potential alteration in comfort, pain Goals: Patient/caregiver will verbalize adequate pain control between visits Date Initiated: 12/08/2016 Target Resolution Date: 03/10/2017 Goal Status: Active Interventions: Complete pain assessment as per visit requirements Notes: ` Wound/Skin Impairment Nursing Diagnoses: Impaired tissue integrity Knowledge deficit related to ulceration/compromised skin integrity Goals: Ulcer/skin breakdown will have a volume reduction of 80% [name] week 12 Date Initiated: 12/08/2016 Target Resolution Date: 03/31/2017 Goal Status: Active Interventions: Assess patient/caregiver ability to perform ulcer/skin care regimen upon admission and as needed Assess ulceration(s) every visit Notes: [name], [name] ([id number]) Electronic Signature(s) Signed: 12/08/2016 4:22:44 PM [name]: [name] Mulling Entered [name]: [name] Mulling on 12/08/2016 10:29:07 Blizzard, [name] ([id number]) -------------------------------------------------------------------------------- Pain Assessment Details Patient Name: [name] Masters. Date of Service: 12/08/2016 10:30 AM Medical Record Number: [id number] Patient Account Number: [id number] Date of Birth/Sex: Apr 14, 1966 (51 y.o. Female) Treating RN: [name] Cordia, [name] Primary Care Omero Kowal: [place], [name] Other Clinician: Referring Brittney Mucha: [place], [name] Treating Jarica Plass/Extender: [name] Re in [name]: 0 Active Problems Location of Pain Severity and Description of Pain Patient Has Paino Yes Site Locations Pain Location: Pain in Ulcers With Dressing Change: Yes Rate the pain. Current Pain Level: 4 Character of Pain Describe the Pain: Aching Pain Management and Medication Current Pain Management: Electronic Signature(s) Signed: 12/08/2016 4:22:44  PM [name]:  [name] Mulling Entered [name]: [name] Mulling on 12/08/2016 10:13:19 Lankford, [name] ([id number]) -------------------------------------------------------------------------------- Patient/Caregiver Education Details Patient Name: MONIFAH, FREEHLING. Date of Service: 12/08/2016 10:30 AM Medical Record Number: [id number] Patient Account Number: [id number] Date of Birth/Gender: 06/19/1966 (51 y.o. Female) Treating RN: [name] Cordia, [name] Primary Care Physician: [place], [name] Other Clinician: Referring Physician: [place], [name] Treating Physician/Extender: [name] Re in [name]: 0 Education Assessment Education Provided To: Patient Education Topics Provided Welcome To The Wound Care Center: Handouts: Welcome To The [facility] Methods: Explain/Verbal Responses: State content correctly Wound/Skin Impairment: Handouts: Other: change dressing as ordered Methods: Demonstration, Explain/Verbal Responses: State content correctly Electronic Signature(s) Signed: 12/08/2016 4:22:44 PM [name]: [name] Mulling Entered [name]: [name] Mulling on 12/08/2016 10:30:10 Broussard, [name] -------------------------------------------------------------------------------- Wound Assessment Details Patient Name: [name] Masters. Date of Service: 12/08/2016 10:30 AM Medical Record Number: [id number] Patient Account Number: [id number] Date of Birth/Sex: 11-11-1965 (51 y.o. Female) Treating RN: [name] Cordia, [name] Primary Care Bunny Kleist: [place], [name] Other Clinician: Referring Lajuanda Penick: [place], [name] Treating Janelys Glassner/Extender: [name] Re in [name]: 0 Wound Status Wound Number: 4 Primary Pressure Ulcer Etiology: Wound Location: Right Scapula Wound Open Wounding Event: Pressure Injury Status: Date Acquired: 11/07/2016 Comorbid Asthma, Chronic Obstructive [name] Of [name]: 0 History: Pulmonary Disease (COPD), History of Clustered Wound: No pressure wounds,  Paraplegia Photos Photo Uploaded [name]: [name] Mulling on 12/08/2016 11:09:50 Wound Measurements Length: (cm) 2 Width: (cm) 1 Depth: (cm) 0.3 Area: (cm) 1.571 Volume: (cm) 0.471 % Reduction in Area: % Reduction in Volume: Epithelialization: None Tunneling: No Undermining: No Wound Description Classification: Category/Stage III Foul Odor Aft Wound Margin: Distinct, outline attached Slough/Fibrin Exudate Amount: Large Exudate Type: Serous Exudate Color: amber er Cleansing: No o No Wound Bed Granulation Amount: Large (67-100%) Granulation Quality: Red, Pink Necrotic Amount: Small (1-33%) Necrotic Quality: Adherent [address], [place] M. ([id number]) Periwound Skin Texture Texture Color No Abnormalities Noted: No No Abnormalities Noted: No Moisture Temperature / Pain No Abnormalities Noted: No Temperature: No Abnormality Tenderness on Palpation: Yes Wound Preparation Ulcer Cleansing: Rinsed/Irrigated with Saline Topical Anesthetic Applied: Other: lidocaine 4%, [name] Notes Wound #4 (Right Scapula) 1. Cleansed with: Clean wound with Normal Saline 2. Anesthetic Topical Lidocaine 4% cream to wound bed prior to debridement 3. Peri-wound Care: Skin Prep 4. Dressing Applied: Hydrafera Blue 5. Secondary Dressing Applied Bordered Foam Dressing Dry Gauze Electronic Signature(s) Signed: 12/08/2016 4:22:44 PM [name]: [name] Mulling Entered [name]: [name] Mulling on 12/08/2016 10:25:36 Bernat, [name] ([id number]) -------------------------------------------------------------------------------- Vitals Details Patient Name: [name], [name]. Date of Service: 12/08/2016 10:30 AM Medical Record Number: [id number] Patient Account Number: [id number] Date of Birth/Sex: Jul 04, 1965 (51 y.o. Female) Treating RN: [name] Cordia, [name] Primary Care Quentez Lober: [place], [name] Other Clinician: Referring Kerstin Crusoe: [place], [name] Treating Sasuke Yaffe/Extender: [name] Re in  [name]: 0 Vital Signs Time Taken: 10:13 Temperature (F): 97.8 Height (in): 63 Pulse (bpm): 78 Source: Stated Respiratory Rate (breaths/min): 18 Weight (lbs): 93 Blood Pressure (mmHg): 110/79 Source: Stated Reference Range: 80 - 120 mg / dl Body Mass Index (BMI): 16.5 Electronic Signature(s) Signed: 12/08/2016 4:22:44 PM [name]: [name] Mulling Entered [name]: [name] Mulling on 12/08/2016 10:14:18

## 2016-12-15 ENCOUNTER — Ambulatory Visit: Payer: Medicare Other | Admitting: Surgery

## 2016-12-29 ENCOUNTER — Encounter: Payer: Medicare Other | Admitting: Surgery

## 2016-12-29 DIAGNOSIS — L89113 Pressure ulcer of right upper back, stage 3: Secondary | ICD-10-CM | POA: Diagnosis not present

## 2016-12-31 NOTE — Progress Notes (Signed)
[name], [name] ([id number]) Visit Report for 12/29/2016 Arrival Information Details Patient Name: [name], [name] Date of Service: 12/29/2016 11:00 AM Medical Record Number: [id number] Patient Account Number: [id number] Date of Birth/Sex: [birth year] (51 y.o. Female) Treating RN: [name] Primary Care [name]: [place], [name] Other Clinician: Referring [name]: [place], [name] Treating [name]/Extender: [name] in Treatment: 3 Visit Information History Since Last Visit Added or deleted any medications: No Patient Arrived: Wheel Chair Any new allergies or adverse reactions: No Arrival Time: 11:06 Had a fall or experienced change in No activities of daily living that may affect Accompanied By: partner risk of falls: Transfer Assistance: Manual Signs or symptoms of abuse/neglect since last No Patient Identification Verified: Yes visito Secondary Verification Process Yes Hospitalized since last visit: No Completed: Has Dressing in Place as Prescribed: Yes Patient Requires Transmission-Based No Pain Present Now: Yes Precautions: Patient Has Alerts: No Electronic Signature(s) Signed: 12/29/2016 4:59:56 PM By: [name] Entered By: [name] on 12/29/2016 11:16:51 Walls, [name] ([id number]) -------------------------------------------------------------------------------- Clinic Level of Care Assessment Details Patient Name: [name], [name] Date of Service: 12/29/2016 11:00 AM Medical Record Number: [id number] Patient Account Number: [id number] Date of Birth/Sex: [birth year] (51 y.o. Female) Treating RN: [name] Primary Care [name]: [place], [name] Other Clinician: Referring [name]: [place], [name] Treating [name] in Treatment: 3 Clinic Level of Care Assessment Items TOOL 4 Quantity Score []  - Use when only an EandM is performed on FOLLOW-UP visit 0 ASSESSMENTS - Nursing Assessment / Reassessment X -  Reassessment of Co-morbidities (includes updates in patient status) 1 10 X - Reassessment of Adherence to Treatment Plan 1 5 ASSESSMENTS - Wound and Skin Assessment / Reassessment X - Simple Wound Assessment / Reassessment - one wound 1 5 []  - Complex Wound Assessment / Reassessment - multiple wounds 0 []  - Dermatologic / Skin Assessment (not related to wound area) 0 ASSESSMENTS - Focused Assessment []  - Circumferential Edema Measurements - multi extremities 0 []  - Nutritional Assessment / Counseling / Intervention 0 []  - Lower Extremity Assessment (monofilament, tuning fork, pulses) 0 []  - Peripheral Arterial Disease Assessment (using hand held doppler) 0 ASSESSMENTS - Ostomy and/or Continence Assessment and Care []  - Incontinence Assessment and Management 0 []  - Ostomy Care Assessment and Management (repouching, etc.) 0 PROCESS - Coordination of Care X - Simple Patient / Family Education for ongoing care 1 15 []  - Complex (extensive) Patient / Family Education for ongoing care 0 []  - Staff obtains ChiropractorConsents, Records, Test Results / Process Orders 0 []  - Staff telephones HHA, Nursing Homes / Clarify orders / etc 0 []  - Routine Transfer to another Facility (non-emergent condition) 0 [name], [name] ([id number]) []  - Routine Hospital Admission (non-emergent condition) 0 []  - New Admissions / Manufacturing engineernsurance Authorizations / Ordering NPWT, Apligraf, etc. 0 []  - Emergency Hospital Admission (emergent condition) 0 X - Simple Discharge Coordination 1 10 []  - Complex (extensive) Discharge Coordination 0 PROCESS - Special Needs []  - Pediatric / Minor Patient Management 0 []  - Isolation Patient Management 0 []  - Hearing / Language / Visual special needs 0 []  - Assessment of Community assistance (transportation, D/C planning, etc.) 0 []  - Additional assistance / Altered mentation 0 []  - Support Surface(s) Assessment (bed, cushion, seat, etc.) 0 INTERVENTIONS - Wound Cleansing / Measurement X -  Simple Wound Cleansing - one wound 1 5 []  - Complex Wound Cleansing - multiple wounds 0 X - Wound Imaging (photographs - any number of wounds) 1 5 []  -  Wound Tracing (instead of photographs) 0 X - Simple Wound Measurement - one wound 1 5 []  - Complex Wound Measurement - multiple wounds 0 INTERVENTIONS - Wound Dressings X - Small Wound Dressing one or multiple wounds 1 10 []  - Medium Wound Dressing one or multiple wounds 0 []  - Large Wound Dressing one or multiple wounds 0 []  - Application of Medications - topical 0 []  - Application of Medications - injection 0 INTERVENTIONS - Miscellaneous []  - External ear exam 0 [name], [name] ([id number]) []  - Specimen Collection (cultures, biopsies, blood, body fluids, etc.) 0 []  - Specimen(s) / Culture(s) sent or taken to Lab for analysis 0 []  - Patient Transfer (multiple staff / Michiel Sites Lift / Similar devices) 0 []  - Simple Staple / Suture removal (25 or less) 0 []  - Complex Staple / Suture removal (26 or more) 0 []  - Hypo / Hyperglycemic Management (close monitor of Blood Glucose) 0 []  - Ankle / Brachial Index (ABI) - do not check if billed separately 0 X - Vital Signs 1 5 Has the patient been seen at the hospital within the last three years: Yes Total Score: 75 Level Of Care: New/Established - Level 2 Electronic Signature(s) Signed: 12/29/2016 4:59:56 PM By: [name] Sites Entered By: [name] Sites on 12/29/2016 14:06:19 [name] ([id number]) -------------------------------------------------------------------------------- Encounter Discharge Information Details Patient Name: [name], KASEL. Date of Service: 12/29/2016 11:00 AM Medical Record Number: [id number] Patient Account Number: [id number] Date of Birth/Sex: 1965/09/09 (51 y.o. Female) Treating RN: [name] Sites Primary Care [name]: [place], [name] Other Clinician: Referring Cristi Gwynn: [place], [name] Treating Judiann Celia/Extender: [name] Re in Treatment:  3 Encounter Discharge Information Items Discharge Pain Level: 0 Discharge Condition: Stable Ambulatory Status: Wheelchair Discharge Destination: Home Transportation: Private Auto Accompanied By: partner Schedule Follow-up Appointment: Yes Medication Reconciliation completed and provided to Patient/Care No Daanish Copes: Provided on Clinical Summary of Care: 12/29/2016 Form Type Recipient Paper Patient CW Electronic Signature(s) Signed: 12/29/2016 11:29:20 AM By: Gwenlyn Perking Entered By: Gwenlyn Perking on 12/29/2016 11:29:20 Kines, [name] ([id number]) -------------------------------------------------------------------------------- Lower Extremity Assessment Details Patient Name: SHAMYIA, GRANDPRE. Date of Service: 12/29/2016 11:00 AM Medical Record Number: [id number] Patient Account Number: [id number] Date of Birth/Sex: 02-Feb-1966 (51 y.o. Female) Treating RN: [name] Sites Primary Care Kenae Lindquist: [place], [name] Other Clinician: Referring Aevah Stansbery: [place], [name] Treating Ranulfo Kall/Extender: [name] Re in Treatment: 3 Electronic Signature(s) Signed: 12/29/2016 4:59:56 PM By: [name] Sites Entered By: [name] Sites on 12/29/2016 11:12:49 Mcgilvray, [name] ([id number]) -------------------------------------------------------------------------------- Multi Wound Chart Details Patient Name: [name], [name]. Date of Service: 12/29/2016 11:00 AM Medical Record Number: [id number] Patient Account Number: [id number] Date of Birth/Sex: August 24, 1965 (51 y.o. 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] [N/A:N/A] Comorbid History: [4:Asthma, Chronic Obstructive Pulmonary Disease (COPD), History of pressure wounds, Paraplegia] [N/A:N/A] Date Acquired: [4:11/07/2016] [N/A:N/A] [name] of Treatment: [4:3] [N/A:N/A] Wound Status: [4:Open] [N/A:N/A] Measurements L x W x D 1.5x1.7x0.3 [N/A:N/A] (cm) Area (cm) : [4:2.003] [N/A:N/A] Volume (cm) : [4:0.601] [N/A:N/A] % Reduction in Area: [4:-27.50%] [N/A:N/A] % Reduction in Volume: -27.60% [N/A:N/A] Position 1 (o'clock): 2 Maximum Distance 1 0.8 (cm): Tunneling: [4:Yes] [N/A:N/A] Classification: [4:Category/Stage III] [N/A:N/A] Exudate Amount: [4:Large] [N/A:N/A] Exudate Type: [4:Serous] [  N/A:N/A] Exudate Color: [4:amber] [N/A:N/A] Wound Margin: [4:Distinct, outline attached] [N/A:N/A] Granulation Amount: [4:Large (67-100%)] [N/A:N/A] Granulation Quality: [4:Red, Pink] [N/A:N/A] Necrotic Amount: [4:Small (1-33%)] [N/A:N/A] Epithelialization: None N/A N/A Periwound Skin Texture: No Abnormalities Noted N/A N/A Periwound Skin No Abnormalities Noted N/A N/A Moisture: Periwound Skin Color: No Abnormalities Noted N/A N/A Temperature: No Abnormality N/A N/A Tenderness on Yes N/A N/A Palpation: Wound Preparation: Ulcer Cleansing: N/A N/A Rinsed/Irrigated with Saline Topical Anesthetic Applied: Other: lidocaine 4% Treatment Notes Electronic Signature(s) Signed: 12/29/2016 11:22:47 AM By: Evlyn Kanner MD, FACS Entered By: Evlyn Kanner on 12/29/2016 11:22:47 [name] ([id number]) -------------------------------------------------------------------------------- Multi-Disciplinary Care Plan Details Patient Name: [name], MARCHESE. Date of Service: 12/29/2016 11:00 AM Medical Record Number: [id number] Patient Account Number: [id number] Date of Birth/Sex: 03-Jul-1966 (51 y.o. Female) Treating RN: [name] Sites Primary Care  Saron Tweed: [place], [name] Other Clinician: Referring Matison Nuccio: [place], [name] Treating Zymire Turnbo/Extender: [name] Re in Treatment: 3 Active Inactive ` Abuse / Safety / Falls / Self Care Management Nursing Diagnoses: Potential for falls Goals: Patient will not experience any injury related to falls Date Initiated: 12/08/2016 Target Resolution Date: 04/07/2017 Goal Status: Active Interventions: Assess: immobility, friction, shearing, incontinence upon admission and as needed Assess impairment of mobility on admission and as needed per policy Notes: ` Nutrition Nursing Diagnoses: Imbalanced nutrition Potential for alteratiion in Nutrition/Potential for imbalanced nutrition Goals: Patient/caregiver agrees to and verbalizes understanding of need to use nutritional supplements and/or vitamins as prescribed Date Initiated: 12/08/2016 Target Resolution Date: 04/07/2017 Goal Status: Active Interventions: Assess patient nutrition upon admission and as needed per policy Notes: ` Orientation to the Wound Care Program STEVE, GREGG [place]. ([id number]) Nursing Diagnoses: Knowledge deficit related to the wound healing center program Goals: Patient/caregiver will verbalize understanding of the Wound Healing Center Program Date Initiated: 12/08/2016 Target Resolution Date: 01/06/2017 Goal Status: Active Interventions: Provide education on orientation to the wound center Notes: ` Pain, Acute or Chronic Nursing Diagnoses: Pain, acute or chronic: actual or potential Potential alteration in comfort, pain Goals: Patient/caregiver will verbalize adequate pain control between visits Date Initiated: 12/08/2016 Target Resolution Date: 03/10/2017 Goal Status: Active Interventions: Complete pain assessment as per visit requirements Notes: ` Wound/Skin Impairment Nursing Diagnoses: Impaired tissue integrity Knowledge deficit related to ulceration/compromised skin integrity Goals: Ulcer/skin  breakdown will have a volume reduction of 80% by week 12 Date Initiated: 12/08/2016 Target Resolution Date: 03/31/2017 Goal Status: Active Interventions: Assess patient/caregiver ability to perform ulcer/skin care regimen upon admission and as needed Assess ulceration(s) every visit Notes: [name], [name] ([id number]) Electronic Signature(s) Signed: 12/29/2016 4:59:56 PM By: [name] Sites Entered By: [name] Sites on 12/29/2016 11:12:54 [name], [name] ([id number]) -------------------------------------------------------------------------------- Pain Assessment Details Patient Name: [name] Date of Service: 12/29/2016 11:00 AM Medical Record Number: [id number] Patient Account Number: [id number] Date of Birth/Sex: 04-30-1966 (51 y.o. Female) Treating RN: [name] Sites Primary Care Annaleise Burger: [place], [name] Other Clinician: Referring Allisyn Kunz: [place], [name] Treating Khadija Thier/Extender: [name] Re in Treatment: 3 Active Problems Location of Pain Severity and Description of Pain Patient Has Paino Yes Site Locations Pain Location: Pain in Ulcers With Dressing Change: Yes Duration of the Pain. Constant / Intermittento Constant Pain Management and Medication Current Pain Management: Electronic Signature(s) Signed: 12/29/2016 4:59:56 PM By: [name] Sites Entered By: [name] Sites on 12/29/2016 11:07:16 Towers, [name] ([id number]) -------------------------------------------------------------------------------- Patient/Caregiver Education Details Patient Name: [name] Date of Service: 12/29/2016 11:00 AM Medical Record Number: [id number] Patient Account Number: [id number] Date of Birth/Gender: 1965-07-06 (51 y.o. Female) Treating RN: Dorthy,  Mardene Celeste Primary Care Physician: [place], [name] Other Clinician: Referring Physician: [place], [name] Treating Physician/Extender: [name] Re in Treatment: 3 Education Assessment Education Provided  To: Patient and Caregiver Education Topics Provided Wound/Skin Impairment: Handouts: Other: wound care as ordered Methods: Demonstration, Explain/Verbal Responses: State content correctly Electronic Signature(s) Signed: 12/29/2016 4:59:56 PM By: [name] Sites Entered By: [name] Sites on 12/29/2016 11:16:17 [place], [name] -------------------------------------------------------------------------------- Wound Assessment Details Patient Name: [name] Date of Service: 12/29/2016 11:00 AM Medical Record Number: [id number] Patient Account Number: [id number] Date of Birth/Sex: September 21, 1965 (51 y.o. Female) Treating RN: [name] Sites Primary Care Mellina Benison: [place], [name] Other Clinician: Referring Norlan Rann: [place], [name] Treating Janaye Corp/Extender: [name] Re in Treatment: 3 Wound Status Wound Number: 4 Primary Pressure Ulcer Etiology: Wound Location: Right Scapula Wound Open Wounding Event: Pressure Injury Status: Date Acquired: 11/07/2016 Comorbid Asthma, Chronic Obstructive [name] Of Treatment: 3 History: Pulmonary Disease (COPD), History of Clustered Wound: No pressure wounds, Paraplegia Photos Photo Uploaded By: [name] Sites on 12/29/2016 13:46:21 Wound Measurements Length: (cm) 1.5 Width: (cm) 1.7 Depth: (cm) 0.3 Area: (cm) 2.003 Volume: (cm) 0.601 % Reduction in Area: -27.5% % Reduction in Volume: -27.6% Epithelialization: None Tunneling: Yes Position (o'clock): 2 Maximum Distance: (cm) 0.8 Undermining: No Wound Description Classification: Category/Stage III Wound Margin: Distinct, outline attached Exudate Amount: Large Exudate Type: Serous Exudate Color: amber Foul Odor After Cleansing: No Slough/Fibrino No Wound Bed Granulation Amount: Large (67-100%) Deanda, Cidney M. ([id number]) Granulation Quality: Red, Pink Necrotic Amount: Small (1-33%) Necrotic Quality: Adherent Slough Periwound Skin Texture Texture  Color No Abnormalities Noted: No No Abnormalities Noted: No Moisture Temperature / Pain No Abnormalities Noted: No Temperature: No Abnormality Tenderness on Palpation: Yes Wound Preparation Ulcer Cleansing: Rinsed/Irrigated with Saline Topical Anesthetic Applied: Other: lidocaine 4%, Treatment Notes Wound #4 (Right Scapula) 1. Cleansed with: Clean wound with Normal Saline 2. Anesthetic Topical Lidocaine 4% cream to wound bed prior to debridement 4. Dressing Applied: Hydrafera Blue 5. Secondary Dressing Applied Bordered Foam Dressing Electronic Signature(s) Signed: 12/29/2016 4:59:56 PM By: [name] Sites Entered By: [name] Sites on 12/29/2016 11:12:39 Alberson, [name] ([id number]) -------------------------------------------------------------------------------- Vitals Details Patient Name: MALAYSHIA, ALL. Date of Service: 12/29/2016 11:00 AM Medical Record Number: [id number] Patient Account Number: [id number] Date of Birth/Sex: 12-Feb-1966 (51 y.o. Female) Treating RN: [name] Sites Primary Care Leiann Sporer: [place], [name] Other Clinician: Referring Jessia Kief: [place], [name] Treating Veretta Sabourin/Extender: [name] Re in Treatment: 3 Vital Signs Time Taken: 11:07 Temperature (F): 97.9 Height (in): 63 Pulse (bpm): 71 Weight (lbs): 93 Respiratory Rate (breaths/min): 18 Body Mass Index (BMI): 16.5 Blood Pressure (mmHg): 102/68 Reference Range: 80 - 120 mg / dl Electronic Signature(s) Signed: 12/29/2016 4:59:56 PM By: [name] Sites Entered By: [name] Sites on 12/29/2016 11:07:35

## 2016-12-31 NOTE — Progress Notes (Signed)
[name], [name] ([id number]) Visit Report for 12/29/2016 Chief Complaint Document Details Patient Name: [name], [name] Date of Service: 12/29/2016 11:00 AM Medical Record Number: [id number] Patient Account Number: [id number] Date of Birth/Sex: 1966-05-29 (51 y.o. 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 She comes in for a pressure injury to the right scapular region which had healed in February and now has a reopened for the last 2 weeks Electronic Signature(s) Signed: 12/29/2016 11:22:53 AM By: Evlyn Kanner MD, FACS Entered By: Evlyn Kanner on 12/29/2016 11:22:53 Jupiter, [name] ([id number]) -------------------------------------------------------------------------------- HPI Details Patient Name: [name], [name]. Date of Service: 12/29/2016 11:00 AM Medical Record Number: [id number] Patient Account Number: [id number] Date of Birth/Sex: February 04, 1966 (51 y.o. Female) Treating RN: Curtis Sites Primary Care Provider: [place], JULIE Other Clinician: Referring Provider: [place], JULIE Treating Provider/Extender: Rudene Re in Treatment: 3 History of Present Illness Location: right scapular region Quality: Patient reports No Pain. Severity: Patient states wound (s) are getting better. Duration: Patient has had the wound for > 2 weeks prior to seeking treatment at the wound center Context: The wound would happen gradually Modifying Factors: Other treatment(s) tried include:been in hospital sick with a pneumonia and has had problems with transportation Associated Signs and Symptoms: Patient reports having:healed the wound on her sacral region HPI Description: 51 year old  patient who was been an unreliable historian says she had completely healed in February and most recently her home health noticed that the wound on the right scapular area had reopened. She was in hospital about a month ago with pneumonia at that time a workup was done. We have no notes today but will workup the electronic medical record. on review of her records electronically I do not find any recent hospital MRI in the last 6 months. She had a CT scan of the chest in February 2018 which does not show any osseous lesions. she was admitted to the hospital between May 14 and May 17 for 3 days for mucus plugging and collapse of the left lung. at that time she had a decubitus ulcer of the right scapular region stage III. her history was noted to have quadriplegia C5-C7 incomplete, neurogenic bladder, nephrostomy catheter and emphysema. patient tells me that she has not smoked for last month and a half but as per her hospital records she was smoking in the middle of May. ======= Old notes 51 year old patient was had quadriparesis for the last 9 years and has had previous extensive surgery and reconstruction at [facility]. For about 4-5 months she's had a decubitus ulcer on the right scapular region. she was lost to follow-up for the last 6 months. she had a couple of small areas which have opened out in the region of previous scar tissue in the sacral region but these haveall healed now. She does not have any other significant comorbidities. She has a good Roho cushion for her wheelchair and she has a specialized mattress for sleep. She has restarted smoking and smokes about half a pack of cigarettes a day 02/18/2015 -- X-ray of the pelvis -- IMPRESSION:No acute osseous injury of the pelvis. If there is clinical concern regarding osteomyelitis, recommend further evaluation with CT. X-ray of the sacrum and coccyx IMPRESSION:No acute osseous abnormality of the sacrum. If  there  is further clinical concern regarding osteomyelitis of the sacrum, further evaluation with CT is recommended. ========= Electronic Signature(s) Signed: 12/29/2016 11:22:58 AM By: Evlyn Kanner MD, FACS CARSYN, BOSTER M. ([id number]) Entered By: Evlyn Kanner on 12/29/2016 11:22:58 Humphres, [name] ([id number]) -------------------------------------------------------------------------------- Physical Exam Details Patient Name: [name], [name]. Date of Service: 12/29/2016 11:00 AM Medical Record Number: [id number] Patient Account Number: [id number] Date of Birth/Sex: May 21, 1966 (51 y.o. Female) Treating RN: Curtis Sites Primary Care Provider: [place], JULIE Other Clinician: Referring Provider: [place], JULIE Treating Provider/Extender: Rudene Re in Treatment: 3 Constitutional . Pulse regular. Respirations normal and unlabored. Afebrile. . Eyes Nonicteric. Reactive to light. Ears, Nose, Mouth, and Throat Lips, teeth, and gums WNL.Marland Kitchen Moist mucosa without lesions. Neck supple and nontender. No palpable supraclavicular or cervical adenopathy. Normal sized without goiter. Respiratory WNL. No retractions.. Breath sounds WNL, No rubs, rales, rhonchi, or wheeze.. Cardiovascular Heart rhythm and rate regular, no murmur or gallop.. Pedal Pulses WNL. No clubbing, cyanosis or edema. Chest Breasts symmetical and no nipple discharge.. Breast tissue WNL, no masses, lumps, or tenderness.. Lymphatic No adneopathy. No adenopathy. No adenopathy. Musculoskeletal Adexa without tenderness or enlargement.. Digits and nails w/o clubbing, cyanosis, infection, petechiae, ischemia, or inflammatory conditions.. Integumentary (Hair, Skin) No suspicious lesions. No crepitus or fluctuance. No peri-wound warmth or erythema. No masses.Marland Kitchen Psychiatric Judgement and insight Intact.. No evidence of depression, anxiety, or agitation.. Notes the wound of the right scapular region continues to have  significant undermining towards the 2:00 position and other than that no sharp debridement was required. Electronic Signature(s) Signed: 12/29/2016 11:23:22 AM By: Evlyn Kanner MD, FACS Entered By: Evlyn Kanner on 12/29/2016 11:23:22 [name] ([id number]) -------------------------------------------------------------------------------- Physician Orders Details Patient Name: TOMEEKA, PLAUGHER. Date of Service: 12/29/2016 11:00 AM Medical Record Number: [id number] Patient Account Number: [id number] Date of Birth/Sex: 1966/04/06 (51 y.o. Female) Treating RN: Curtis Sites Primary Care Provider: [place], JULIE Other Clinician: Referring Provider: [place], JULIE Treating Provider/Extender: Rudene Re in Treatment: 3 Verbal / Phone Orders: No Diagnosis Coding Wound Cleansing Wound #4 Right Scapula o Clean wound with Normal Saline. o Clean wound with wound cleanser. o May Shower, gently pat wound dry prior to applying new dressing. Anesthetic Wound #4 Right Scapula o Topical Lidocaine 4% cream applied to wound bed prior to debridement - for clinic use Skin Barriers/Peri-Wound Care Wound #4 Right Scapula o Skin Prep Primary Wound Dressing Wound #4 Right Scapula o Hydrafera Blue Secondary Dressing Wound #4 Right Scapula o Dry Gauze o Boardered Foam Dressing Dressing Change Frequency Wound #4 Right Scapula o Change dressing every other day. Follow-up Appointments Wound #4 Right Scapula o Return Appointment in 1 week. Off-Loading Wound #4 Right Scapula o Turn and reposition every 2 hours [name], Karstyn M. ([id number]) Additional Orders / Instructions Wound #4 Right Scapula o Increase protein intake. Home Health Wound #4 Right Scapula o Continue Home Health Visits - [facility] o Home Health Nurse may visit PRN to address patientos wound care needs. o FACE TO FACE ENCOUNTER: MEDICARE and MEDICAID PATIENTS: I certify that this patient  is under my care and that I had a face-to-face encounter that meets the physician face-to-face encounter requirements with this patient on this date. The encounter with the patient was in whole or in part for the following MEDICAL CONDITION: (primary reason for Home Healthcare) MEDICAL NECESSITY: I certify, that based on my findings, NURSING services are a medically necessary home health service. HOME BOUND STATUS: I certify that my clinical findings support  that this patient is homebound (i.e., Due to illness or injury, pt requires aid of supportive devices such as crutches, cane, wheelchairs, walkers, the use of special transportation or the assistance of another person to leave their place of residence. There is a normal inability to leave the home and doing so requires considerable and taxing effort. Other absences are for medical reasons / religious services and are infrequent or of short duration when for other reasons). o If current dressing causes regression in wound condition, may D/C ordered dressing product/s and apply Normal Saline Moist Dressing daily until next Wound Healing Center / Other MD appointment. Notify Wound Healing Center of regression in wound condition at [phone]. o Please direct any NON-WOUND related issues/requests for orders to patient's Primary Care Physician Electronic Signature(s) Signed: 12/29/2016 4:14:17 PM By: Evlyn KannerBritto, Eduardo Wurth MD, FACS Signed: 12/29/2016 4:59:56 PM By: Curtis Sitesorthy, Joanna Entered By: Curtis Sitesorthy, Joanna on 12/29/2016 11:17:25 Mclinden, [name] ([id number]) -------------------------------------------------------------------------------- Problem List Details Patient Name: [name], Fatumata M. Date of Service: 12/29/2016 11:00 AM Medical Record Number: [id number] Patient Account Number: [id number] Date of Birth/Sex: 1965-09-02 (51 y.o. Female) Treating RN: Curtis Sitesorthy, Joanna Primary Care Provider: [place], JULIE Other Clinician: Referring Provider:  [place], JULIE Treating Provider/Extender: Rudene ReBritto, Savana Spina Weeks in Treatment: 3 Active Problems ICD-10 Encounter Code Description Active Date Diagnosis G82.52 Quadriplegia, C1-C4 incomplete 12/08/2016 Yes E44.0 Moderate protein-calorie malnutrition 12/08/2016 Yes L89.113 Pressure ulcer of right upper back, stage 3 12/08/2016 Yes Inactive Problems Resolved Problems Electronic Signature(s) Signed: 12/29/2016 11:22:42 AM By: Evlyn KannerBritto, Angeline Trick MD, FACS Entered By: Evlyn KannerBritto, Salman Wellen on 12/29/2016 11:22:42 Gloeckner, [name] ([id number]) -------------------------------------------------------------------------------- Progress Note Details Patient Name: [name], Nerea M. Date of Service: 12/29/2016 11:00 AM Medical Record Number: [id number] Patient Account Number: [id number] Date of Birth/Sex: 1965-09-02 (51 y.o. 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 She comes in for a pressure injury to the right scapular region which had healed in February and now has a reopened for the last 2 weeks History of Present Illness (HPI) The following HPI elements were documented for the patient's wound: Location: right scapular region Quality: Patient reports No Pain. Severity: Patient states wound (s) are getting better. Duration: Patient has had the wound for > 2 weeks prior to seeking treatment at the wound center Context: The wound would happen gradually Modifying Factors: Other treatment(s) tried include:been in hospital sick with a pneumonia and has had problems with transportation Associated Signs and Symptoms: Patient reports having:healed the wound on her sacral region 51 year old patient who was  been an unreliable historian says she had completely healed in February and most recently her home health noticed that the wound on the right scapular area had reopened. She was in hospital about a month ago with pneumonia at that time a workup was done. We have no notes today but will workup the electronic medical record. on review of her records electronically I do not find any recent hospital MRI in the last 6 months. She had a CT scan of the chest in February 2018 which does not show any osseous lesions. she was admitted to the hospital between May 14 and May 17 for 3 days for mucus plugging and collapse of the left lung. at that time she had a decubitus ulcer of the right scapular region  stage III. her history was noted to have quadriplegia C5-C7 incomplete, neurogenic bladder, nephrostomy catheter and emphysema. patient tells me that she has not smoked for last month and a half but as per her hospital records she was smoking in the middle of May. 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 For about 4-5 months she's had a decubitus ulcer on the right scapular region. she was lost to follow-up for the last 6 months. she had a couple of small areas which have opened out in the region of previous scar tissue in the sacral region but these haveall healed now. She does not have any other significant comorbidities. She has a good Roho cushion for her wheelchair and she has a specialized mattress for sleep. She has restarted smoking and smokes about half a pack of cigarettes a day SHANEISHA, BURKEL. ([id number]) 02/18/2015 -- X-ray of the pelvis -- IMPRESSION:No acute osseous injury of the pelvis. If there is clinical concern regarding osteomyelitis, recommend further evaluation with CT. X-ray of the sacrum and coccyx IMPRESSION:No acute osseous abnormality of the  sacrum. If there is further clinical concern regarding osteomyelitis of the sacrum, further evaluation with CT is recommended. ========= Objective Constitutional Pulse regular. Respirations normal and unlabored. Afebrile. Vitals Time Taken: 11:07 AM, Height: 63 in, Weight: 93 lbs, BMI: 16.5, Temperature: 97.9 F, Pulse: 71 bpm, Respiratory Rate: 18 breaths/min, Blood Pressure: 102/68 mmHg. Eyes Nonicteric. Reactive to light. Ears, Nose, Mouth, and Throat Lips, teeth, and gums WNL.Marland Kitchen Moist mucosa without lesions. Neck supple and nontender. No palpable supraclavicular or cervical adenopathy. Normal sized without goiter. Respiratory WNL. No retractions.. Breath sounds WNL, No rubs, rales, rhonchi, or wheeze.. Cardiovascular Heart rhythm and rate regular, no murmur or gallop.. Pedal Pulses WNL. No clubbing, cyanosis or edema. Chest Breasts symmetical and no nipple discharge.. Breast tissue WNL, no masses, lumps, or tenderness.. Lymphatic No adneopathy. No adenopathy. No adenopathy. Musculoskeletal Adexa without tenderness or enlargement.. Digits and nails w/o clubbing, cyanosis, infection, petechiae, ischemia, or inflammatory conditions.Marland Kitchen Psychiatric ANAYS, DETORE. ([id number]) Judgement and insight Intact.. No evidence of depression, anxiety, or agitation.. General Notes: the wound of the right scapular region continues to have significant undermining towards the 2:00 position and other than that no sharp debridement was required. Integumentary (Hair, Skin) No suspicious lesions. No crepitus or fluctuance. No peri-wound warmth or erythema. No masses.. Wound #4 status is Open. Original cause of wound was Pressure Injury. The wound is located on the Right Scapula. The wound measures 1.5cm length x 1.7cm width x 0.3cm depth; 2.003cm^2 area and 0.601cm^3 volume. There is no undermining noted, however, there is tunneling at 2:00 with a maximum distance of 0.8cm. There is a large amount  of serous drainage noted. The wound margin is distinct with the outline attached to the wound base. There is large (67-100%) red, pink granulation within the wound bed. There is a small (1-33%) amount of necrotic tissue within the wound bed including Adherent Slough. Periwound temperature was noted as No Abnormality. The periwound has tenderness on palpation. Assessment Active Problems ICD-10 G82.52 - Quadriplegia, C1-C4 incomplete E44.0 - Moderate protein-calorie malnutrition L89.113 - Pressure ulcer of right upper back, stage 3 Plan Wound Cleansing: Wound #4 Right Scapula: Clean wound with Normal Saline. Clean wound with wound cleanser. May Shower, gently pat wound dry prior to applying new dressing. Anesthetic: Wound #4 Right Scapula: Topical Lidocaine 4% cream  applied to wound bed prior to debridement - for clinic use Skin Barriers/Peri-Wound Care: Wound #4 Right Scapula: Skin Prep Primary Wound Dressing: Wound #4 Right Scapula: Hydrafera Blue Linenberger, Jadelyn M. ([id number]) Secondary Dressing: Wound #4 Right Scapula: Dry Gauze Boardered Foam Dressing Dressing Change Frequency: Wound #4 Right Scapula: Change dressing every other day. Follow-up Appointments: Wound #4 Right Scapula: Return Appointment in 1 week. Off-Loading: Wound #4 Right Scapula: Turn and reposition every 2 hours Additional Orders / Instructions: Wound #4 Right Scapula: Increase protein intake. Home Health: Wound #4 Right Scapula: Continue Home Health Visits - [facility] Nurse may visit PRN to address patient s wound care needs. FACE TO FACE ENCOUNTER: MEDICARE and MEDICAID PATIENTS: I certify that this patient is under my care and that I had a face-to-face encounter that meets the physician face-to-face encounter requirements with this patient on this date. The encounter with the patient was in whole or in part for the following MEDICAL CONDITION: (primary reason for Home Healthcare)  MEDICAL NECESSITY: I certify, that based on my findings, NURSING services are a medically necessary home health service. HOME BOUND STATUS: I certify that my clinical findings support that this patient is homebound (i.e., Due to illness or injury, pt requires aid of supportive devices such as crutches, cane, wheelchairs, walkers, the use of special transportation or the assistance of another person to leave their place of residence. There is a normal inability to leave the home and doing so requires considerable and taxing effort. Other absences are for medical reasons / religious services and are infrequent or of short duration when for other reasons). If current dressing causes regression in wound condition, may D/C ordered dressing product/s and apply Normal Saline Moist Dressing daily until next Wound Healing Center / Other MD appointment. Notify Wound Healing Center of regression in wound condition at [phone]. Please direct any NON-WOUND related issues/requests for orders to patient's Primary Care Physician The patient says she has been very compliant with the dressing changes and is still off cigarettes. After review I have recommended: 1. Hydrofera Blue to be packed into the wound and changed every other day 2. Offloading has been discussed with her in great detail 3. Appropriate protein supplements, vitamin A, vitamin C and zinc 4. Regular visits to the wound center. Electronic Signature(s) BRESHA, HOSACK ([id number]) Signed: 12/29/2016 11:24:47 AM By: Evlyn Kanner MD, FACS Entered By: Evlyn Kanner on 12/29/2016 11:24:46 Bailly, [name] ([id number]) -------------------------------------------------------------------------------- SuperBill Details Patient Name: LYNSI, DOONER. Date of Service: 12/29/2016 Medical Record Number: [id number] Patient Account Number: [id number] Date of Birth/Sex: 03-27-1966 (51 y.o. Female) Treating RN: Curtis Sites Primary Care Provider:  [place], JULIE Other Clinician: Referring Provider: [place], JULIE Treating Provider/Extender: Rudene Re in Treatment: 3 Diagnosis Coding ICD-10 Codes Code Description G82.52 Quadriplegia, C1-C4 incomplete E44.0 Moderate protein-calorie malnutrition L89.113 Pressure ulcer of right upper back, stage 3 Facility Procedures CPT4 Code: [id number] Description: [phone] - WOUND CARE VISIT-LEV 2 EST PT Modifier: Quantity: 1 Physician Procedures CPT4 Code: [id number] Description: 99213 - WC PHYS LEVEL 3 - EST PT ICD-10 Description Diagnosis G82.52 Quadriplegia, C1-C4 incomplete E44.0 Moderate protein-calorie malnutrition L89.113 Pressure ulcer of right upper back, stage 3 Modifier: Quantity: 1 Electronic Signature(s) Signed: 12/29/2016 2:06:29 PM By: Curtis Sites Signed: 12/29/2016 4:14:17 PM By: Evlyn Kanner MD, FACS Previous Signature: 12/29/2016 11:25:04 AM Version By: Evlyn Kanner MD, FACS Entered By: Curtis Sites on 12/29/2016 14:06:28

## 2017-01-12 ENCOUNTER — Encounter: Payer: Medicare Other | Attending: Physician Assistant | Admitting: Physician Assistant

## 2017-01-12 DIAGNOSIS — E440 Moderate protein-calorie malnutrition: Secondary | ICD-10-CM | POA: Insufficient documentation

## 2017-01-12 DIAGNOSIS — L89113 Pressure ulcer of right upper back, stage 3: Secondary | ICD-10-CM | POA: Insufficient documentation

## 2017-01-12 DIAGNOSIS — G8252 Quadriplegia, C1-C4 incomplete: Secondary | ICD-10-CM | POA: Insufficient documentation

## 2017-01-12 DIAGNOSIS — F17210 Nicotine dependence, cigarettes, uncomplicated: Secondary | ICD-10-CM | POA: Insufficient documentation

## 2017-01-12 DIAGNOSIS — J449 Chronic obstructive pulmonary disease, unspecified: Secondary | ICD-10-CM | POA: Diagnosis not present

## 2017-01-14 NOTE — Progress Notes (Signed)
[name], [name] ([id number]) Visit Report for 01/12/2017 Chief Complaint Document Details Patient Name: [name], [name] Date of Service: 01/12/2017 10:15 AM Medical Record Number: [id number] Patient Account Number: [id number] Date of Birth/Sex: April 11, 1966 (51 y.o. 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 She comes in for a pressure injury to the right scapular region which had healed in February and now has a reopened Psychologist, prison and probation serviceslectronic Signature(s) Signed: 01/12/2017 6:13:19 PM By: Lenda KelpStone III, Rosselyn Martha PA-C Entered By: Lenda KelpStone III, Cinsere Mizrahi on 01/12/2017 17:36:42 [name], [name] ([id number]) -------------------------------------------------------------------------------- HPI Details Patient Name: [name], [name] Date of Service: 01/12/2017 10:15 AM Medical Record Number: [id number] Patient Account Number: [id number] Date of Birth/Sex: April 11, 1966 (51 y.o. Female) Treating RN: Curtis Sitesorthy, Joanna Primary Care Provider: [place]MON[place], JULIE Other Clinician: Referring Provider: Palestinian TerritoryMONACO, JULIE Treating Provider/Extender: STONE III, Wynn Kernes Weeks in Treatment: 5 History of Present Illness Location: right scapular region Quality: Patient reports No Pain. Severity: Patient states wound (s) are getting better. Duration: Patient has had the wound for > 2 weeks prior to seeking treatment at the wound center Context: The wound would happen gradually Modifying Factors: Other treatment(s) tried include:been in hospital sick with a pneumonia and has had problems with transportation Associated Signs and Symptoms: Patient reports having:healed the wound on her sacral region HPI Description: 51 year old patient who was been  an unreliable historian says she had completely healed in February and most recently her home health noticed that the wound on the right scapular area had reopened. She was in hospital about a month ago with pneumonia at that time a workup was done. We have no notes today but will workup the electronic medical record. on review of her records electronically I do not find any recent hospital MRI in the last 6 months. She had a CT scan of the chest in February 2018 which does not show any osseous lesions. she was admitted to the hospital between May 14 and May 17 for 3 days for mucus plugging and collapse of the left lung. at that time she had a decubitus ulcer of the right scapular region stage III. her history was noted to have quadriplegia C5-C7 incomplete, neurogenic bladder, nephrostomy catheter and emphysema. patient tells me that she has not smoked for last month and a half but as per her hospital records she was smoking in the middle of May. 01/12/17 patient scapular region appears to be doing fairly well on evaluation today. She is having really no syndicate discomfort and has been tolerating the dressing changes well complication. ======= Old notes 51 year old patient was had quadriparesis for the last 9 years and has had previous extensive surgery and [facility]. For about 4-5 months she's had a decubitus ulcer on the right scapular region. she was lost to follow-up for the last 6 months. she had a couple of small areas which have opened out in the region of previous scar tissue in the sacral region but these haveall healed now. She does not have any other significant comorbidities. She has a good Roho cushion for her wheelchair and she has a specialized mattress for sleep. She has restarted smoking and smokes about half a pack of cigarettes a day 02/18/2015 -- X-ray of the pelvis -- IMPRESSION:No acute osseous injury of the  pelvis. If there is  clinical concern regarding osteomyelitis, recommend further evaluation with CT. X-ray of the sacrum and coccyx IMPRESSION:No acute osseous abnormality of the sacrum. If there is further clinical concern regarding osteomyelitis of the sacrum, further evaluation with CT is recommended. ========= [name], [name] ([id number]) Electronic Signature(s) Signed: 01/12/2017 6:13:19 PM By: Lenda Kelp PA-C Entered By: Lenda Kelp on 01/12/2017 17:37:30 [name], [name] ([id number]) -------------------------------------------------------------------------------- Physical Exam Details Patient Name: [name], MANGANELLO. Date of Service: 01/12/2017 10:15 AM Medical Record Number: [id number] Patient Account Number: [id number] Date of Birth/Sex: February 26, 1966 (51 y.o. Female) Treating RN: Curtis Sites Primary Care Provider: [place], JULIE Other Clinician: Referring Provider: [place], JULIE Treating Provider/Extender: STONE III, Cambrey Lupi Weeks in Treatment: 5 Constitutional Chronically ill appearing but in no apparent acute distress. Respiratory normal breathing without difficulty. clear to auscultation bilaterally. Cardiovascular regular rate and rhythm with normal S1, S2. Psychiatric this patient is able to make decisions and demonstrates good insight into disease process. Alert and Oriented x 3. pleasant and cooperative. Notes Her wounds and to show a fairly good granular surface without any evidence of slough requiring sharp debridement today. Electronic Signature(s) Signed: 01/12/2017 6:13:19 PM By: Lenda Kelp PA-C Entered By: Lenda Kelp on 01/12/2017 17:38:05 [name], [name] ([id number]) -------------------------------------------------------------------------------- Physician Orders Details Patient Name: CORONA, POPOVICH. Date of Service: 01/12/2017 10:15 AM Medical Record Number: [id number] Patient Account Number: [id number] Date of Birth/Sex: 07/22/1965 (51 y.o.  Female) Treating RN: Huel Coventry Primary Care Provider: [place], JULIE Other Clinician: Referring Provider: [place], JULIE Treating Provider/Extender: Linwood Dibbles, Denetria Luevanos Weeks in Treatment: 5 Verbal / Phone Orders: No Diagnosis Coding ICD-10 Coding Code Description G82.52 Quadriplegia, C1-C4 incomplete E44.0 Moderate protein-calorie malnutrition L89.113 Pressure ulcer of right upper back, stage 3 Wound Cleansing Wound #4 Right Scapula o Clean wound with Normal Saline. o Clean wound with wound cleanser. o May Shower, gently pat wound dry prior to applying new dressing. Anesthetic Wound #4 Right Scapula o Topical Lidocaine 4% cream applied to wound bed prior to debridement - for clinic use Skin Barriers/Peri-Wound Care Wound #4 Right Scapula o Skin Prep Primary Wound Dressing Wound #4 Right Scapula o Hydrafera Blue Secondary Dressing Wound #4 Right Scapula o Dry Gauze o Boardered Foam Dressing Dressing Change Frequency Wound #4 Right Scapula o Change dressing every other day. Follow-up Appointments AAYLA, MARROCCO ([id number]) Wound #4 Right Scapula o Return Appointment in 2 weeks. Off-Loading Wound #4 Right Scapula o Turn and reposition every 2 hours Additional Orders / Instructions Wound #4 Right Scapula o Increase protein intake. Home Health Wound #4 Right Scapula o Continue Home Health Visits - [facility] o Home Health Nurse may visit PRN to address patientos wound care needs. o FACE TO FACE ENCOUNTER: MEDICARE and MEDICAID PATIENTS: I certify that this patient is under my care and that I had a face-to-face encounter that meets the physician face-to-face encounter requirements with this patient on this date. The encounter with the patient was in whole or in part for the following MEDICAL CONDITION: (primary reason for Home Healthcare) MEDICAL NECESSITY: I certify, that based on my findings, NURSING services are a medically necessary home  health service. HOME BOUND STATUS: I certify that my clinical findings support that this patient is homebound (i.e., Due to illness or injury, pt requires aid of supportive devices such as crutches, cane, wheelchairs, walkers, the use of special transportation or the assistance of another person to leave their place of residence. There is a normal inability to  leave the home and doing so requires considerable and taxing effort. Other absences are for medical reasons / religious services and are infrequent or of short duration when for other reasons). o If current dressing causes regression in wound condition, may D/C ordered dressing product/s and apply Normal Saline Moist Dressing daily until next Wound Healing Center / Other MD appointment. Notify Wound Healing Center of regression in wound condition at [phone]. o Please direct any NON-WOUND related issues/requests for orders to patient's Primary Care Physician Notes We are gonna continue with the [facility] Dressing per the above wound care orders. If anything worsens in the interim patient will contact our office for additional recommendations. Otherwise we will see her for reevaluation in two weeks. Electronic Signature(s) Signed: 01/12/2017 6:13:19 PM By: Lenda Kelp PA-C Previous Signature: 01/12/2017 10:37:40 AM Version By: Elliot Gurney, BSN, RN, CWS, Kim RN, BSN Entered By: Lenda Kelp on 01/12/2017 17:38:43 [name], [name] ([id number]) -------------------------------------------------------------------------------- Problem List Details Patient Name: SAMAH, LAPIANA. Date of Service: 01/12/2017 10:15 AM Medical Record Number: [id number] Patient Account Number: [id number] Date of Birth/Sex: March 25, 1966 (51 y.o. Female) Treating RN: Curtis Sites Primary Care Provider: [place], JULIE Other Clinician: Referring Provider: [place], JULIE Treating Provider/Extender: STONE III, Gayna Braddy Weeks in Treatment: 5 Active  Problems ICD-10 Encounter Code Description Active Date Diagnosis G82.52 Quadriplegia, C1-C4 incomplete 12/08/2016 Yes E44.0 Moderate protein-calorie malnutrition 12/08/2016 Yes L89.113 Pressure ulcer of right upper back, stage 3 12/08/2016 Yes Inactive Problems Resolved Problems Electronic Signature(s) Signed: 01/12/2017 6:13:19 PM By: Lenda Kelp PA-C Entered By: Lenda Kelp on 01/12/2017 17:35:56 [name], [name] ([id number]) -------------------------------------------------------------------------------- Progress Note Details Patient Name: [name] Masters. Date of Service: 01/12/2017 10:15 AM Medical Record Number: [id number] Patient Account Number: [id number] Date of Birth/Sex: [birth year] (51 y.o. 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 She comes in for a pressure injury to the right scapular region which had healed in February and now has a reopened History of Present Illness (HPI) The following HPI elements were documented for the patient's wound: Location: right scapular region Quality: Patient reports No Pain. Severity: Patient states wound (s) are getting better. Duration: Patient has had the wound for > 2 weeks prior to seeking treatment at the wound center Context: The wound would happen gradually Modifying Factors: Other treatment(s) tried include:been in hospital sick with a pneumonia and has had problems with transportation Associated Signs and Symptoms: Patient reports having:healed the wound on her sacral region 51 year old patient who was been an unreliable historian says she had completely healed in February and most recently her home health  noticed that the wound on the right scapular area had reopened. She was in hospital about a month ago with pneumonia at that time a workup was done. We have no notes today but will workup the electronic medical record. on review of her records electronically I do not find any recent hospital MRI in the last 6 months. She had a CT scan of the chest in February 2018 which does not show any osseous lesions. she was admitted to the hospital between May 14 and May 17 for 3 days for mucus plugging and collapse of the left lung. at that time she had a decubitus ulcer of the right scapular region  stage III. her history was noted to have quadriplegia C5-C7 incomplete, neurogenic bladder, nephrostomy catheter and emphysema. patient tells me that she has not smoked for last month and a half but as per her hospital records she was smoking in the middle of May. 01/12/17 patient scapular region appears to be doing fairly well on evaluation today. She is having really no syndicate discomfort and has been tolerating the dressing changes well complication. 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 For about 4-5 months she's had a decubitus ulcer on the right scapular region. she was lost to follow-up for the last 6 months. she had a couple of small areas which have opened out in the region of previous scar tissue in the sacral region but these haveall healed now. She Mcconaughey, Brady M. ([id number]) does not have any other significant comorbidities. She has a good Roho cushion for her wheelchair and she has a specialized mattress for sleep. She has restarted smoking and smokes about half a pack of cigarettes a day 02/18/2015 -- X-ray of the pelvis -- IMPRESSION:No acute osseous injury of the pelvis. If there is clinical concern regarding osteomyelitis, recommend further evaluation with  CT. X-ray of the sacrum and coccyx IMPRESSION:No acute osseous abnormality of the sacrum. If there is further clinical concern regarding osteomyelitis of the sacrum, further evaluation with CT is recommended. ========= Objective Constitutional Chronically ill appearing but in no apparent acute distress. Vitals Time Taken: 9:52 AM, Height: 63 in, Weight: 93 lbs, BMI: 16.5, Temperature: 98.0 F, Pulse: 87 bpm, Respiratory Rate: 16 breaths/min, Blood Pressure: 114/78 mmHg. Respiratory normal breathing without difficulty. clear to auscultation bilaterally. Cardiovascular regular rate and rhythm with normal S1, S2. Psychiatric this patient is able to make decisions and demonstrates good insight into disease process. Alert and Oriented x 3. pleasant and cooperative. General Notes: Her wounds and to show a fairly good granular surface without any evidence of slough requiring sharp debridement today. Integumentary (Hair, Skin) Wound #4 status is Open. Original cause of wound was Pressure Injury. The wound is located on the Right Scapula. The wound measures 1.1cm length x 1.2cm width x 0.3cm depth; 1.037cm^2 area and 0.311cm^3 volume. There is Fat Layer (Subcutaneous Tissue) Exposed exposed. There is no tunneling noted, however, there is undermining starting at 2:00 and ending at 5:00 with a maximum distance of 0.8cm. There is a large amount of serous drainage noted. The wound margin is distinct with the outline attached to the wound base. There is large (67-100%) red, pink granulation within the wound bed. There is a small (1-33%) amount of necrotic tissue within the wound bed including Adherent Slough. Periwound temperature was ESME, FREUND. ([id number]) noted as No Abnormality. The periwound has tenderness on palpation. Assessment Active Problems ICD-10 G82.52 - Quadriplegia, C1-C4 incomplete E44.0 - Moderate protein-calorie malnutrition L89.113 - Pressure ulcer of right upper back,  stage 3 Plan Wound Cleansing: Wound #4 Right Scapula: Clean wound with Normal Saline. Clean wound with wound cleanser. May Shower, gently pat wound dry prior to applying new dressing. Anesthetic: Wound #4 Right Scapula: Topical Lidocaine 4% cream applied to wound bed prior to debridement - for clinic use Skin Barriers/Peri-Wound Care: Wound #4 Right Scapula: Skin Prep Primary Wound Dressing: Wound #4 Right Scapula: Hydrafera Blue Secondary Dressing: Wound #4 Right Scapula: Dry Gauze Boardered Foam Dressing Dressing Change Frequency: Wound #4 Right Scapula: Change dressing every other  day. Follow-up Appointments: Wound #4 Right Scapula: Return Appointment in 2 weeks. Off-Loading: Wound #4 Right Scapula: Turn and reposition every 2 hours Additional Orders / Instructions: Wound #4 Right Scapula: TYLA, BURGNER M. ([id number]) Increase protein intake. Home Health: Wound #4 Right Scapula: Continue Home Health Visits - [facility] Nurse may visit PRN to address patient s wound care needs. FACE TO FACE ENCOUNTER: MEDICARE and MEDICAID PATIENTS: I certify that this patient is under my care and that I had a face-to-face encounter that meets the physician face-to-face encounter requirements with this patient on this date. The encounter with the patient was in whole or in part for the following MEDICAL CONDITION: (primary reason for Home Healthcare) MEDICAL NECESSITY: I certify, that based on my findings, NURSING services are a medically necessary home health service. HOME BOUND STATUS: I certify that my clinical findings support that this patient is homebound (i.e., Due to illness or injury, pt requires aid of supportive devices such as crutches, cane, wheelchairs, walkers, the use of special transportation or the assistance of another person to leave their place of residence. There is a normal inability to leave the home and doing so requires considerable and taxing  effort. Other absences are for medical reasons / religious services and are infrequent or of short duration when for other reasons). If current dressing causes regression in wound condition, may D/C ordered dressing product/s and apply Normal Saline Moist Dressing daily until next Wound Healing Center / Other MD appointment. Notify Wound Healing Center of regression in wound condition at [phone]. Please direct any NON-WOUND related issues/requests for orders to patient's Primary Care Physician General Notes: We are gonna continue with the [facility] Dressing per the above wound care orders. If anything worsens in the interim patient will contact our office for additional recommendations. Otherwise we will see her for reevaluation in two weeks. Electronic Signature(s) Signed: 01/12/2017 6:13:19 PM By: Lenda Kelp PA-C Entered By: Lenda Kelp on 01/12/2017 17:38:54 Bockrath, [name] ([id number]) -------------------------------------------------------------------------------- SuperBill Details Patient Name: RANDA, RISS. Date of Service: 01/12/2017 Medical Record Number: [id number] Patient Account Number: [id number] Date of Birth/Sex: 09/28/1965 (51 y.o. Female) Treating RN: Curtis Sites Primary Care Provider: [place], JULIE Other Clinician: Referring Provider: [place], JULIE Treating Provider/Extender: STONE III, Telesia Ates Weeks in Treatment: 5 Diagnosis Coding ICD-10 Codes Code Description G82.52 Quadriplegia, C1-C4 incomplete E44.0 Moderate protein-calorie malnutrition L89.113 Pressure ulcer of right upper back, stage 3 Facility Procedures CPT4 Code: [id number] Description: 99213 - WOUND CARE VISIT-LEV 3 EST PT Modifier: Quantity: 1 Physician Procedures CPT4 Code: 2440102 Description: 99213 - WC PHYS LEVEL 3 - EST PT ICD-10 Description Diagnosis G82.52 Quadriplegia, C1-C4 incomplete E44.0 Moderate protein-calorie malnutrition L89.113 Pressure ulcer of right upper  back, stage 3 Modifier: Quantity: 1 Electronic Signature(s) Signed: 01/12/2017 6:13:19 PM By: Lenda Kelp PA-C Entered By: Lenda Kelp on 01/12/2017 17:39:13

## 2017-01-15 NOTE — Progress Notes (Addendum)
[name], [name] ([id number]) Visit Report for 01/12/2017 Arrival Information Details Patient Name: [name], [name] Date of Service: 01/12/2017 10:15 AM Medical Record Number: [id number] Patient Account Number: [id number] Date of Birth/Sex: 11-10-1965 (51 y.o. Female) Treating RN: Huel Coventry Primary Care Courtny Bennison: [place] Other Clinician: Referring Garrie Woodin: [place] Treating Gali Spinney/Extender: Linwood Dibbles, HOYT Weeks in Treatment: 5 Visit Information History Since Last Visit Added or deleted any medications: No Patient Arrived: Wheel Chair Any new allergies or adverse reactions: No Arrival Time: 09:51 Had a fall or experienced change in No activities of daily living that may affect Accompanied By: friend risk of falls: Transfer Assistance: Manual Signs or symptoms of abuse/neglect since last No Patient Identification Verified: Yes visito Secondary Verification Process Yes Hospitalized since last visit: No Completed: Has Dressing in Place as Prescribed: Yes Patient Requires Transmission-Based No Pain Present Now: No Precautions: Patient Has Alerts: No Electronic Signature(s) Signed: 01/12/2017 10:37:40 AM By: Elliot Gurney, BSN, RN, CWS, Kim RN, BSN Entered By: Elliot Gurney, BSN, RN, CWS, Kim on 01/12/2017 09:51:22 [name] -------------------------------------------------------------------------------- Clinic Level of Care Assessment Details Patient Name: [name], [name] Date of Service: 01/12/2017 10:15 AM Medical Record Number: [id number] Patient Account Number: [id number] Date of Birth/Sex: [birth year] (51 y.o. Female) Treating RN: Huel Coventry Primary Care Samella Lucchetti: [place], [place] Other Clinician: Referring Teffany Blaszczyk: [place] Treating Milad Bublitz/Extender: Linwood Dibbles, HOYT Weeks in Treatment: 5 Clinic Level of Care Assessment Items TOOL 4 Quantity Score []  - Use when only an EandM is performed on FOLLOW-UP visit 0 ASSESSMENTS - Nursing Assessment /  Reassessment []  - Reassessment of Co-morbidities (includes updates in patient status) 0 X - Reassessment of Adherence to Treatment Plan 1 5 ASSESSMENTS - Wound and Skin Assessment / Reassessment X - Simple Wound Assessment / Reassessment - one wound 1 5 []  - Complex Wound Assessment / Reassessment - multiple wounds 0 []  - Dermatologic / Skin Assessment (not related to wound area) 0 ASSESSMENTS - Focused Assessment []  - Circumferential Edema Measurements - multi extremities 0 []  - Nutritional Assessment / Counseling / Intervention 0 []  - Lower Extremity Assessment (monofilament, tuning fork, pulses) 0 []  - Peripheral Arterial Disease Assessment (using hand held doppler) 0 ASSESSMENTS - Ostomy and/or Continence Assessment and Care []  - Incontinence Assessment and Management 0 []  - Ostomy Care Assessment and Management (repouching, etc.) 0 PROCESS - Coordination of Care X - Simple Patient / Family Education for ongoing care 1 15 []  - Complex (extensive) Patient / Family Education for ongoing care 0 X - Staff obtains Chiropractor, Records, Test Results / Process Orders 1 10 []  - Staff telephones HHA, Nursing Homes / Clarify orders / etc 0 []  - Routine Transfer to another Facility (non-emergent condition) 0 [name], [name] ([id number]) []  - Routine Hospital Admission (non-emergent condition) 0 []  - New Admissions / Manufacturing engineer / Ordering NPWT, Apligraf, etc. 0 []  - Emergency Hospital Admission (emergent condition) 0 X - Simple Discharge Coordination 1 10 []  - Complex (extensive) Discharge Coordination 0 PROCESS - Special Needs []  - Pediatric / Minor Patient Management 0 []  - Isolation Patient Management 0 []  - Hearing / Language / Visual special needs 0 []  - Assessment of Community assistance (transportation, D/C planning, etc.) 0 []  - Additional assistance / Altered mentation 0 []  - Support Surface(s) Assessment (bed, cushion, seat, etc.) 0 INTERVENTIONS - Wound Cleansing /  Measurement X - Simple Wound Cleansing - one wound 1 5 []  - Complex Wound Cleansing - multiple wounds 0 X - Wound Imaging (  photographs - any number of wounds) 1 5 []  - Wound Tracing (instead of photographs) 0 X - Simple Wound Measurement - one wound 1 5 []  - Complex Wound Measurement - multiple wounds 0 INTERVENTIONS - Wound Dressings []  - Small Wound Dressing one or multiple wounds 0 X - Medium Wound Dressing one or multiple wounds 1 15 []  - Large Wound Dressing one or multiple wounds 0 []  - Application of Medications - topical 0 []  - Application of Medications - injection 0 INTERVENTIONS - Miscellaneous []  - External ear exam 0 [name], [name] ([id number]) []  - Specimen Collection (cultures, biopsies, blood, body fluids, etc.) 0 []  - Specimen(s) / Culture(s) sent or taken to Lab for analysis 0 []  - Patient Transfer (multiple staff / Michiel Sites Lift / Similar devices) 0 []  - Simple Staple / Suture removal (25 or less) 0 []  - Complex Staple / Suture removal (26 or more) 0 []  - Hypo / Hyperglycemic Management (close monitor of Blood Glucose) 0 []  - Ankle / Brachial Index (ABI) - do not check if billed separately 0 X - Vital Signs 1 5 Has the patient been seen at the hospital within the last three years: Yes Total Score: 80 Level Of Care: New/Established - Level 3 Electronic Signature(s) Signed: 01/12/2017 10:37:40 AM By: Elliot Gurney, BSN, RN, CWS, Kim RN, BSN Entered By: Elliot Gurney, BSN, RN, CWS, Kim on 01/12/2017 10:08:01 [name] ([id number]) -------------------------------------------------------------------------------- Encounter Discharge Information Details Patient Name: [name], [name]. Date of Service: 01/12/2017 10:15 AM Medical Record Number: [id number] Patient Account Number: [id number] Date of Birth/Sex: Jul 11, 1965 (51 y.o. Female) Treating RN: Huel Coventry Primary Care Ronie Fleeger: [place], [place] Other Clinician: Referring Malia Corsi: [place], [place] Treating Alejandra Hunt/Extender:  Linwood Dibbles, HOYT Weeks in Treatment: 5 Encounter Discharge Information Items Discharge Pain Level: 0 Discharge Condition: Stable Ambulatory Status: Wheelchair Discharge Destination: Home Transportation: Private Auto Accompanied By: friend Schedule Follow-up Appointment: Yes Medication Reconciliation completed and provided to Patient/Care Yes Welma Mccombs: Provided on Clinical Summary of Care: 01/12/2017 Form Type Recipient Paper Patient CW Electronic Signature(s) Signed: 01/12/2017 10:10:24 AM By: Gwenlyn Perking Entered By: Gwenlyn Perking on 01/12/2017 10:10:24 Monje, [name] ([id number]) -------------------------------------------------------------------------------- Lower Extremity Assessment Details Patient Name: KERIANNE, GURR. Date of Service: 01/12/2017 10:15 AM Medical Record Number: [id number] Patient Account Number: [id number] Date of Birth/Sex: 07/06/1965 (51 y.o. Female) Treating RN: Huel Coventry Primary Care Plumer Mittelstaedt: [place], [place] Other Clinician: Referring Zoua Caporaso: [place], [place] Treating Laelani Vasko/Extender: Skeet Simmer in Treatment: 5 Electronic Signature(s) Signed: 01/12/2017 10:37:40 AM By: Elliot Gurney, BSN, RN, CWS, Kim RN, BSN Entered By: Elliot Gurney, BSN, RN, CWS, Kim on 01/12/2017 09:58:44 Segler, [name] ([id number]) -------------------------------------------------------------------------------- Multi Wound Chart Details Patient Name: [name], [name] Date of Service: 01/12/2017 10:15 AM Medical Record Number: [id number] Patient Account Number: [id number] Date of Birth/Sex: 09/04/1965 (51 y.o. Female) Treating RN: Huel Coventry Primary Care Daishon Chui: [place], [place] Other Clinician: Referring Bobbijo Holst: [place], [place] Treating Laronn Devonshire/Extender: STONE III, HOYT Weeks in Treatment: 5 Vital Signs Height(in): 63 Pulse(bpm): 87 Weight(lbs): 93 Blood Pressure 114/78 (mmHg): Body Mass Index(BMI): 16 Temperature(F): 98.0 Respiratory  Rate 16 (breaths/min): Photos: [N/A:N/A] Wound Location: Right Scapula N/A N/A Wounding Event: Pressure Injury N/A N/A Primary Etiology: Pressure Ulcer N/A N/A Comorbid History: Asthma, Chronic N/A N/A Obstructive Pulmonary Disease (COPD), History of pressure wounds, Paraplegia Date Acquired: 11/07/2016 N/A N/A Weeks of Treatment: 5 N/A N/A Wound Status: Open N/A N/A Measurements L x W x D 1.1x1.2x0.3 N/A N/A (cm) Area (cm) : 1.037 N/A N/A Volume (cm) : 0.311  N/A N/A % Reduction in Area: 34.00% N/A N/A % Reduction in Volume: 34.00% N/A N/A Starting Position 1 2 (o'clock): Ending Position 1 5 (o'clock): Maximum Distance 1 0.8 (cm): Undermining: Yes N/A N/A Classification: Category/Stage III N/A N/A Hartin, Aylssa M. ([id number]) Exudate Amount: Large N/A N/A Exudate Type: Serous N/A N/A Exudate Color: amber N/A N/A Wound Margin: Distinct, outline attached N/A N/A Granulation Amount: Large (67-100%) N/A N/A Granulation Quality: Red, Pink N/A N/A Necrotic Amount: Small (1-33%) N/A N/A Exposed Structures: Fat Layer (Subcutaneous N/A N/A Tissue) Exposed: Yes Epithelialization: None N/A N/A Periwound Skin Texture: No Abnormalities Noted N/A N/A Periwound Skin No Abnormalities Noted N/A N/A Moisture: Periwound Skin Color: No Abnormalities Noted N/A N/A Temperature: No Abnormality N/A N/A Tenderness on Yes N/A N/A Palpation: Wound Preparation: Ulcer Cleansing: N/A N/A Rinsed/Irrigated with Saline Topical Anesthetic Applied: Other: lidocaine 4% Treatment Notes Electronic Signature(s) Signed: 01/12/2017 10:37:40 AM By: Elliot GurneyWoody, BSN, RN, CWS, Kim RN, BSN Entered By: Elliot GurneyWoody, BSN, RN, CWS, Kim on 01/12/2017 09:58:58 [name], [name] ([id number]) -------------------------------------------------------------------------------- Multi-Disciplinary Care Plan Details Patient Name: [name], [name] Date of Service: 01/12/2017 10:15 AM Medical Record Number:  [id number] Patient Account Number: [id number] Date of Birth/Sex: 08-Aug-1965 (51 y.o. Female) Treating RN: Huel CoventryWoody, Kim Primary Care Naviah Belfield: [place] Other Clinician: Referring Ereka Brau: [place], [place] Treating Vitaliy Eisenhour/Extender: Skeet SimmerSTONE III, HOYT Weeks in Treatment: 5 Active Inactive Electronic Signature(s) Signed: 03/13/2017 8:12:03 AM By: Elliot GurneyWoody, BSN, RN, CWS, Kim RN, BSN Previous Signature: 01/12/2017 10:37:40 AM Version By: Elliot GurneyWoody, BSN, RN, CWS, Kim RN, BSN Entered By: Elliot GurneyWoody, BSN, RN, CWS, Kim on 03/08/2017 09:14:30 [name], [name] ([id number]) -------------------------------------------------------------------------------- Pain Assessment Details Patient Name: [name], [name] Date of Service: 01/12/2017 10:15 AM Medical Record Number: [id number] Patient Account Number: [id number] Date of Birth/Sex: 08-Aug-1965 (51 y.o. Female) Treating RN: Huel CoventryWoody, Kim Primary Care Deshanta Lady: [place], [place] Other Clinician: Referring Lavan Imes: [place], [place] Treating Zineb Glade/Extender: Linwood DibblesSTONE III, HOYT Weeks in Treatment: 5 Active Problems Location of Pain Severity and Description of Pain Patient Has Paino No Site Locations With Dressing Change: No Pain Management and Medication Current Pain Management: Electronic Signature(s) Signed: 01/12/2017 10:37:40 AM By: Elliot GurneyWoody, BSN, RN, CWS, Kim RN, BSN Entered By: Elliot GurneyWoody, BSN, RN, CWS, Kim on 01/12/2017 09:51:46 Hosmer, [name] ([id number]) -------------------------------------------------------------------------------- Patient/Caregiver Education Details Patient Name: [name], [name] Date of Service: 01/12/2017 10:15 AM Medical Record Number: [id number] Patient Account Number: [id number] Date of Birth/Gender: 08-Aug-1965 (51 y.o. Female) Treating RN: Huel CoventryWoody, Kim Primary Care Physician: [place], [place]ULIE Other Clinician: Referring Physician: [place], [place] Treating Physician/Extender: Skeet SimmerSTONE III, HOYT Weeks in Treatment: 5 Education Assessment Education  Provided To: Patient Education Topics Provided Pressure: Handouts: Pressure Ulcers: Care and Offloading Methods: Demonstration Responses: State content correctly Electronic Signature(s) Signed: 01/12/2017 10:37:40 AM By: Elliot GurneyWoody, BSN, RN, CWS, Kim RN, BSN Entered By: Elliot GurneyWoody, BSN, RN, CWS, Kim on 01/12/2017 10:09:39 [name], [name] ([id number]) -------------------------------------------------------------------------------- Wound Assessment Details Patient Name: [name], [name] Date of Service: 01/12/2017 10:15 AM Medical Record Number: [id number] Patient Account Number: [id number] Date of Birth/Sex: 08-Aug-1965 (51 y.o. Female) Treating RN: Huel CoventryWoody, Kim Primary Care Natasia Sanko: [place], [place]ULIE Other Clinician: Referring Grenda Lora: [place], [place]ULIE Treating Tristina Sahagian/Extender: STONE III, HOYT Weeks in Treatment: 5 Wound Status Wound Number: 4 Primary Pressure Ulcer Etiology: Wound Location: Right Scapula Wound Open Wounding Event: Pressure Injury Status: Date Acquired: 11/07/2016 Comorbid Asthma, Chronic Obstructive Weeks Of Treatment: 5 History: Pulmonary Disease (COPD), History of Clustered Wound: No pressure wounds, Paraplegia Photos Wound Measurements Length: (cm) 1.1 Width: (cm) 1.2 Depth: (cm)  0.3 Area: (cm) 1.037 Volume: (cm) 0.311 % Reduction in Area: 34% % Reduction in Volume: 34% Epithelialization: None Tunneling: No Undermining: Yes Starting Position (o'clock): 2 Ending Position (o'clock): 5 Maximum Distance: (cm) 0.8 Wound Description Classification: Category/Stage III Foul Odor Aft Wound Margin: Distinct, outline attached Slough/Fibrin Exudate Amount: Large Exudate Type: Serous Exudate Color: amber er Cleansing: No o No Wound Bed Granulation Amount: Large (67-100%) Exposed Structure Granulation Quality: Red, Pink Fat Layer (Subcutaneous Tissue) Exposed: Yes Necrotic Amount: Small (1-33%) Leichter, Laekyn M. ([id number]) Necrotic Quality: Adherent  Slough Periwound Skin Texture Texture Color No Abnormalities Noted: No No Abnormalities Noted: No Moisture Temperature / Pain No Abnormalities Noted: No Temperature: No Abnormality Tenderness on Palpation: Yes Wound Preparation Ulcer Cleansing: Rinsed/Irrigated with Saline Topical Anesthetic Applied: Other: lidocaine 4%, Electronic Signature(s) Signed: 01/12/2017 10:37:40 AM By: Elliot Gurney, BSN, RN, CWS, Kim RN, BSN Entered By: Elliot Gurney, BSN, RN, CWS, Kim on 01/12/2017 09:57:16 Mccree, [name] ([id number]) -------------------------------------------------------------------------------- Vitals Details Patient Name: CARRIEANNE, KLEEN. Date of Service: 01/12/2017 10:15 AM Medical Record Number: [id number] Patient Account Number: [id number] Date of Birth/Sex: [birth year] (51 y.o. Female) Treating RN: Huel Coventry Primary Care Alaja Goldinger: [place], [place] Other Clinician: Referring Rolene Andrades: [place], [place] Treating Cacie Gaskins/Extender: STONE III, HOYT Weeks in Treatment: 5 Vital Signs Time Taken: 09:52 Temperature (F): 98.0 Height (in): 63 Pulse (bpm): 87 Weight (lbs): 93 Respiratory Rate (breaths/min): 16 Body Mass Index (BMI): 16.5 Blood Pressure (mmHg): 114/78 Reference Range: 80 - 120 mg / dl Electronic Signature(s) Signed: 01/12/2017 10:37:40 AM By: Elliot Gurney, BSN, RN, CWS, Kim RN, BSN Entered By: Elliot Gurney, BSN, RN, CWS, Kim on 01/12/2017 09:52:26

## 2017-01-26 ENCOUNTER — Ambulatory Visit: Payer: Medicare Other | Admitting: Surgery

## 2018-10-13 ENCOUNTER — Encounter: Payer: Self-pay | Admitting: Emergency Medicine

## 2018-10-13 ENCOUNTER — Inpatient Hospital Stay
Admission: EM | Admit: 2018-10-13 | Discharge: 2018-10-15 | DRG: 193 | Disposition: A | Discharge status: Home / Self Care | Payer: Medicare Other | Attending: Internal Medicine | Admitting: Internal Medicine

## 2018-10-13 ENCOUNTER — Emergency Department: Payer: Medicare Other

## 2018-10-13 ENCOUNTER — Other Ambulatory Visit: Payer: Self-pay

## 2018-10-13 DIAGNOSIS — L89310 Pressure ulcer of right buttock, unstageable: Secondary | ICD-10-CM | POA: Diagnosis present

## 2018-10-13 DIAGNOSIS — J440 Chronic obstructive pulmonary disease with acute lower respiratory infection: Secondary | ICD-10-CM | POA: Diagnosis present

## 2018-10-13 DIAGNOSIS — Z800 Family history of malignant neoplasm of digestive organs: Secondary | ICD-10-CM | POA: Diagnosis not present

## 2018-10-13 DIAGNOSIS — L89154 Pressure ulcer of sacral region, stage 4: Secondary | ICD-10-CM | POA: Diagnosis present

## 2018-10-13 DIAGNOSIS — J189 Pneumonia, unspecified organism: Secondary | ICD-10-CM | POA: Diagnosis present

## 2018-10-13 DIAGNOSIS — Z936 Other artificial openings of urinary tract status: Secondary | ICD-10-CM

## 2018-10-13 DIAGNOSIS — Z91013 Allergy to seafood: Secondary | ICD-10-CM | POA: Diagnosis not present

## 2018-10-13 DIAGNOSIS — R0602 Shortness of breath: Secondary | ICD-10-CM | POA: Diagnosis present

## 2018-10-13 DIAGNOSIS — J9811 Atelectasis: Secondary | ICD-10-CM

## 2018-10-13 DIAGNOSIS — Z87891 Personal history of nicotine dependence: Secondary | ICD-10-CM

## 2018-10-13 DIAGNOSIS — Z7951 Long term (current) use of inhaled steroids: Secondary | ICD-10-CM

## 2018-10-13 DIAGNOSIS — F329 Major depressive disorder, single episode, unspecified: Secondary | ICD-10-CM | POA: Diagnosis present

## 2018-10-13 DIAGNOSIS — J9819 Other pulmonary collapse: Secondary | ICD-10-CM | POA: Diagnosis present

## 2018-10-13 DIAGNOSIS — Z79899 Other long term (current) drug therapy: Secondary | ICD-10-CM

## 2018-10-13 DIAGNOSIS — G894 Chronic pain syndrome: Secondary | ICD-10-CM | POA: Diagnosis present

## 2018-10-13 DIAGNOSIS — I69369 Other paralytic syndrome following cerebral infarction affecting unspecified side: Secondary | ICD-10-CM

## 2018-10-13 HISTORY — DX: Chronic obstructive pulmonary disease, unspecified: J44.9

## 2018-10-13 LAB — CBC WITH DIFFERENTIAL/PLATELET
Abs Immature Granulocytes: 0.05 10*3/uL (ref 0.00–0.07)
Basophils Absolute: 0 10*3/uL (ref 0.0–0.1)
Basophils Relative: 0 %
Eosinophils Absolute: 0.2 10*3/uL (ref 0.0–0.5)
Eosinophils Relative: 2 %
HCT: 43 % (ref 36.0–46.0)
Hemoglobin: 12.7 g/dL (ref 12.0–15.0)
Immature Granulocytes: 1 %
Lymphocytes Relative: 19 %
Lymphs Abs: 1.6 10*3/uL (ref 0.7–4.0)
MCH: 27.7 pg (ref 26.0–34.0)
MCHC: 29.5 g/dL — ABNORMAL LOW (ref 30.0–36.0)
MCV: 93.7 fL (ref 80.0–100.0)
Monocytes Absolute: 0.5 10*3/uL (ref 0.1–1.0)
Monocytes Relative: 7 %
Neutro Abs: 5.9 10*3/uL (ref 1.7–7.7)
Neutrophils Relative %: 71 %
Platelets: 395 10*3/uL (ref 150–400)
RBC: 4.59 MIL/uL (ref 3.87–5.11)
RDW: 16.2 % — ABNORMAL HIGH (ref 11.5–15.5)
WBC: 8.3 10*3/uL (ref 4.0–10.5)
nRBC: 0 % (ref 0.0–0.2)

## 2018-10-13 LAB — SALICYLATE LEVEL: Salicylate Lvl: <7 mg/dL (ref 2.8–30.0)

## 2018-10-13 LAB — BASIC METABOLIC PANEL
Anion gap: 12 (ref 5–15)
BUN: 12 mg/dL (ref 6–20)
CO2: 24 mmol/L (ref 22–32)
Calcium: 9.2 mg/dL (ref 8.9–10.3)
Chloride: 104 mmol/L (ref 98–111)
Creatinine, Ser: <0.3 mg/dL — ABNORMAL LOW (ref 0.44–1.00)
Glucose, Bld: 82 mg/dL (ref 70–99)
Potassium: 5.1 mmol/L (ref 3.5–5.1)
Sodium: 140 mmol/L (ref 135–145)

## 2018-10-13 LAB — TROPONIN I: Troponin I: <0.03 ng/mL (ref <0.03)

## 2018-10-13 LAB — PROCALCITONIN: Procalcitonin: <0.1 ng/mL

## 2018-10-13 LAB — BRAIN NATRIURETIC PEPTIDE: B Natriuretic Peptide: 31 pg/mL (ref 0.0–100.0)

## 2018-10-13 MED ORDER — GUAIFENESIN ER 600 MG PO TB12
600.0000 mg | ORAL_TABLET | Freq: Two times a day (BID) | ORAL | Status: DC
  Administered 2018-10-13 – 2018-10-15 (×5): 600 mg via ORAL
  Filled 2018-10-13 (×5): qty 1

## 2018-10-13 MED ORDER — OXYCODONE-ACETAMINOPHEN 5-325 MG PO TABS
1.0000 | ORAL_TABLET | Freq: Three times a day (TID) | ORAL | Status: DC
  Administered 2018-10-13 – 2018-10-15 (×6): 1 via ORAL
  Filled 2018-10-13 (×6): qty 1

## 2018-10-13 MED ORDER — ENSURE ENLIVE PO LIQD
237.0000 mL | Freq: Three times a day (TID) | ORAL | Status: DC
  Administered 2018-10-13 – 2018-10-15 (×4): 237 mL via ORAL

## 2018-10-13 MED ORDER — POLYETHYLENE GLYCOL 3350 17 G PO PACK: 17.0000 g | PACK | Freq: Every day | ORAL | Status: DC | PRN

## 2018-10-13 MED ORDER — SODIUM CHLORIDE 0.9 % IV SOLN
1.0000 g | Freq: Once | INTRAVENOUS | Status: DC
  Filled 2018-10-13: qty 10

## 2018-10-13 MED ORDER — MIRTAZAPINE 15 MG PO TABS
30.0000 mg | ORAL_TABLET | Freq: Every day | ORAL | Status: DC
  Administered 2018-10-13 – 2018-10-14 (×2): 30 mg via ORAL
  Filled 2018-10-13 (×2): qty 2

## 2018-10-13 MED ORDER — ALBUTEROL SULFATE (2.5 MG/3ML) 0.083% IN NEBU: 2.5000 mg | INHALATION_SOLUTION | RESPIRATORY_TRACT | Status: DC | PRN

## 2018-10-13 MED ORDER — ENOXAPARIN SODIUM 40 MG/0.4ML ~~LOC~~ SOLN: 40.0000 mg | SUBCUTANEOUS | Status: DC

## 2018-10-13 MED ORDER — BECLOMETHASONE DIPROPIONATE 40 MCG/ACT IN AERS: 2.0000 | INHALATION_SPRAY | Freq: Two times a day (BID) | RESPIRATORY_TRACT | Status: DC

## 2018-10-13 MED ORDER — BUDESONIDE 0.25 MG/2ML IN SUSP
0.2500 mg | Freq: Two times a day (BID) | RESPIRATORY_TRACT | Status: DC
  Administered 2018-10-13 – 2018-10-15 (×4): 0.25 mg via RESPIRATORY_TRACT
  Filled 2018-10-13 (×4): qty 2

## 2018-10-13 MED ORDER — ACETAMINOPHEN 325 MG PO TABS: 650.0000 mg | ORAL_TABLET | Freq: Four times a day (QID) | ORAL | Status: DC | PRN

## 2018-10-13 MED ORDER — ONDANSETRON HCL 4 MG/2ML IJ SOLN: 4.0000 mg | Freq: Four times a day (QID) | INTRAMUSCULAR | Status: DC | PRN

## 2018-10-13 MED ORDER — ENOXAPARIN SODIUM 30 MG/0.3ML ~~LOC~~ SOLN
30.0000 mg | SUBCUTANEOUS | Status: DC
  Administered 2018-10-13 – 2018-10-14 (×2): 30 mg via SUBCUTANEOUS
  Filled 2018-10-13 (×2): qty 0.3

## 2018-10-13 MED ORDER — ORAL CARE MOUTH RINSE
15.0000 mL | Freq: Two times a day (BID) | OROMUCOSAL | Status: DC
  Administered 2018-10-13 – 2018-10-15 (×3): 15 mL via OROMUCOSAL

## 2018-10-13 MED ORDER — OXYCODONE-ACETAMINOPHEN 10-325 MG PO TABS: 1.0000 | ORAL_TABLET | Freq: Three times a day (TID) | ORAL | Status: DC

## 2018-10-13 MED ORDER — BACLOFEN 10 MG PO TABS
10.0000 mg | ORAL_TABLET | Freq: Three times a day (TID) | ORAL | Status: DC
  Administered 2018-10-13 – 2018-10-15 (×5): 10 mg via ORAL
  Filled 2018-10-13 (×8): qty 1

## 2018-10-13 MED ORDER — METOPROLOL TARTRATE 25 MG PO TABS
12.5000 mg | ORAL_TABLET | Freq: Two times a day (BID) | ORAL | Status: DC
  Administered 2018-10-13 – 2018-10-14 (×2): 12.5 mg via ORAL
  Filled 2018-10-13 (×4): qty 1

## 2018-10-13 MED ORDER — VANCOMYCIN HCL 1000 MG IV SOLR
1000.0000 mg | Freq: Once | INTRAVENOUS | Status: DC
  Filled 2018-10-13: qty 1000

## 2018-10-13 MED ORDER — TIOTROPIUM BROMIDE MONOHYDRATE 18 MCG IN CAPS
18.0000 ug | ORAL_CAPSULE | Freq: Every day | RESPIRATORY_TRACT | Status: DC
  Administered 2018-10-14 – 2018-10-15 (×2): 18 ug via RESPIRATORY_TRACT
  Filled 2018-10-13: qty 5

## 2018-10-13 MED ORDER — ONDANSETRON HCL 4 MG PO TABS: 4.0000 mg | ORAL_TABLET | Freq: Four times a day (QID) | ORAL | Status: DC | PRN

## 2018-10-13 MED ORDER — VANCOMYCIN HCL IN DEXTROSE 1-5 GM/200ML-% IV SOLN
1000.0000 mg | INTRAVENOUS | Status: DC
  Filled 2018-10-13: qty 200

## 2018-10-13 MED ORDER — VANCOMYCIN HCL 1000 MG IV SOLR
1000.0000 mg | INTRAVENOUS | Status: DC
  Filled 2018-10-13: qty 1000

## 2018-10-13 MED ORDER — VANCOMYCIN HCL IN DEXTROSE 1-5 GM/200ML-% IV SOLN
1000.0000 mg | Freq: Once | INTRAVENOUS | Status: AC
  Administered 2018-10-13: 19:00:00 1000 mg via INTRAVENOUS
  Filled 2018-10-13 (×2): qty 200

## 2018-10-13 MED ORDER — LEVOFLOXACIN IN D5W 750 MG/150ML IV SOLN
750.0000 mg | INTRAVENOUS | Status: DC
  Administered 2018-10-13 – 2018-10-14 (×2): 750 mg via INTRAVENOUS
  Filled 2018-10-13 (×3): qty 150

## 2018-10-13 MED ORDER — GABAPENTIN 100 MG PO CAPS
100.0000 mg | ORAL_CAPSULE | Freq: Three times a day (TID) | ORAL | Status: DC
  Administered 2018-10-13 – 2018-10-15 (×6): 100 mg via ORAL
  Filled 2018-10-13 (×6): qty 1

## 2018-10-13 MED ORDER — ACETAMINOPHEN 650 MG RE SUPP: 650.0000 mg | Freq: Four times a day (QID) | RECTAL | Status: DC | PRN

## 2018-10-13 MED ORDER — FLUTICASONE PROPIONATE 50 MCG/ACT NA SUSP
2.0000 | Freq: Every day | NASAL | Status: DC
  Administered 2018-10-14 – 2018-10-15 (×2): 2 via NASAL
  Filled 2018-10-13: qty 16

## 2018-10-13 MED ORDER — ALBUTEROL SULFATE HFA 108 (90 BASE) MCG/ACT IN AERS
2.0000 | INHALATION_SPRAY | RESPIRATORY_TRACT | Status: DC
  Administered 2018-10-13 – 2018-10-15 (×9): 2 via RESPIRATORY_TRACT
  Filled 2018-10-13: qty 6.7

## 2018-10-13 MED ORDER — SODIUM CHLORIDE 0.9 % IV SOLN
1.0000 g | Freq: Once | INTRAVENOUS | Status: AC
  Administered 2018-10-13: 14:00:00 1 g via INTRAVENOUS
  Filled 2018-10-13: qty 1

## 2018-10-13 MED ORDER — SODIUM CHLORIDE 0.9 % IV SOLN
500.0000 mg | Freq: Once | INTRAVENOUS | Status: DC
  Filled 2018-10-13: qty 500

## 2018-10-13 MED ORDER — IBUPROFEN 400 MG PO TABS: 400.0000 mg | ORAL_TABLET | Freq: Four times a day (QID) | ORAL | Status: DC | PRN

## 2018-10-13 MED ORDER — OXYCODONE HCL 5 MG PO TABS
5.0000 mg | ORAL_TABLET | Freq: Three times a day (TID) | ORAL | Status: DC
  Administered 2018-10-13 – 2018-10-15 (×6): 5 mg via ORAL
  Filled 2018-10-13 (×6): qty 1

## 2018-10-13 MED ORDER — SODIUM CHLORIDE 0.9 % IV SOLN
INTRAVENOUS | Status: DC | PRN
  Administered 2018-10-13: 250 mL via INTRAVENOUS

## 2018-10-13 NOTE — H&P (Signed)
[facility] - [place] [facility]   PATIENT NAME: [name]    MR#:  [id number]  DATE OF BIRTH:  Nov 16, 1965  DATE OF ADMISSION:  10/13/2018  PRIMARY CARE PHYSICIAN: [place], Julie A, MD   REQUESTING/REFERRING PHYSICIAN: Dr. Alphonzo Lemmings  CHIEF COMPLAINT:   Chief Complaint  Patient presents with  . Shortness of Breath    HISTORY OF PRESENT ILLNESS:  [name]  is a 53 y.o. female with a known history of paraplegia, COPD, tobacco use presents to the emergency room complaining of worsening shortness of breath over the last 2 days.  Patient has had a cough but no fever.  Patient was here in March 2020 with similar complaints and was found to have left lower lobe collapse from mucous plugging.  She responded well to pulmonary toilet, nebs and incentive spirometry.  Seems to have similar findings on chest x-ray at this time.  It is unclear if patient has pneumonia or not.  Saturations were 90% on room air with tachypnea and placed on 2 L oxygen.  Started on IV antibiotics. Patient has not left her house in many days.  PAST MEDICAL HISTORY:   Past Medical History:  Diagnosis Date  . COPD (chronic obstructive pulmonary disease) (HCC)   . History of urostomy   . Paraplegic spinal paralysis (HCC)     PAST SURGICAL HISTORY:  History reviewed. No pertinent surgical history.  SOCIAL HISTORY:   Social History   Tobacco Use  . Smoking status: Never Smoker  . Smokeless tobacco: Never Used  Substance Use Topics  . Alcohol use: No    FAMILY HISTORY:   Colon cancer in her mother DRUG ALLERGIES:   Allergies  Allergen Reactions  . Shellfish Allergy Shortness Of Breath   REVIEW OF SYSTEMS:   Review of Systems  Constitutional: Positive for malaise/fatigue. Negative for chills and fever.  HENT: Negative for sore throat.   Eyes: Negative for blurred vision, double vision and pain.  Respiratory: Positive for cough and shortness of breath. Negative for  hemoptysis and wheezing.   Cardiovascular: Negative for chest pain, palpitations, orthopnea and leg swelling.  Gastrointestinal: Negative for abdominal pain, constipation, diarrhea, heartburn, nausea and vomiting.  Genitourinary: Negative for dysuria and hematuria.  Musculoskeletal: Negative for back pain and joint pain.  Skin: Negative for rash.  Neurological: Negative for sensory change, speech change, focal weakness and headaches.  Endo/Heme/Allergies: Does not bruise/bleed easily.  Psychiatric/Behavioral: Negative for depression. The patient is not nervous/anxious.    MEDICATIONS AT HOME:   Prior to Admission medications   Medication Sig Start Date End Date Taking? Authorizing Provider  beclomethasone (QVAR) 40 MCG/ACT inhaler Inhale 2 puffs into the lungs 2 (two) times daily.   Yes [provider]  Albuterol Sulfate 108 (90 Base) MCG/ACT AEPB Inhale 1 puff into the lungs every 4 (four) hours as needed.    [provider]  azithromycin (ZITHROMAX) 250 MG tablet Take daily as directed 09/09/16   Enedina Finner, MD  baclofen (LIORESAL) 10 MG tablet Take 10 mg by mouth 3 (three) times daily.    [provider]  brompheniramine-pseudoephedrine-DM 30-2-10 MG/5ML syrup Take 5 mLs by mouth 4 (four) times daily as needed. Patient not taking: Reported on 09/07/2016 03/29/15   Joni Reining, PA-C  cefUROXime (CEFTIN) 500 MG tablet Take 1 tablet (500 mg total) by mouth 2 (two) times daily with a meal. 09/09/16   Enedina Finner, MD  feeding supplement, ENSURE ENLIVE, (ENSURE ENLIVE) LIQD Take  237 mLs by mouth 3 (three) times daily between meals. 09/09/16   Enedina Finner, MD  fluticasone (FLONASE) 50 MCG/ACT nasal spray Place 2 sprays into both nostrils daily. 08/27/15   [provider]  gabapentin (NEURONTIN) 100 MG capsule Take 100 mg by mouth 3 (three) times daily. 08/27/15   [provider]  metoprolol tartrate (LOPRESSOR) 25 MG tablet Take 0.5 tablets (12.5 mg  total) by mouth 2 (two) times daily. 09/09/16   Enedina Finner, MD  mirtazapine (REMERON) 30 MG tablet Take 30 mg by mouth at bedtime. 05/24/16   [provider]  nicotine (NICODERM CQ - DOSED IN MG/24 HOURS) 21 mg/24hr patch Place 21 mg onto the skin daily. 07/10/16   [provider]  sulfamethoxazole-trimethoprim (BACTRIM DS,SEPTRA DS) 800-160 MG per tablet Take 1 tablet by mouth 2 (two) times daily. Patient not taking: Reported on 09/07/2016 03/29/15   Joni Reining, PA-C  tiotropium (SPIRIVA HANDIHALER) 18 MCG inhalation capsule Place 18 mcg into inhaler and inhale daily. 08/18/16   [provider]  traMADol (ULTRAM) 50 MG tablet Take 1 tablet (50 mg total) by mouth every 6 (six) hours as needed for moderate pain. Patient not taking: Reported on 09/07/2016 03/29/15   Joni Reining, PA-C   VITAL SIGNS:  Blood pressure 131/72, pulse 72, temperature 98.4 F (36.9 C), resp. rate 20, height  (1.6 m), weight 37.6 kg, SpO2 90 %.  PHYSICAL EXAMINATION:  Physical Exam  GENERAL:  53 y.o.-year-old patient lying in the bed with no acute distress.  EYES: Pupils equal, round, reactive to light and accommodation. No scleral icterus. Extraocular muscles intact.  HEENT: Head atraumatic, normocephalic. Oropharynx and nasopharynx clear. No oropharyngeal erythema, moist oral mucosa  NECK:  Supple, no jugular venous distention. No thyroid enlargement, no tenderness.  LUNGS: Normal breath sounds bilaterally, no wheezing, rales, rhonchi. No use of accessory muscles of respiration.  CARDIOVASCULAR: S1, S2 normal. No murmurs, rubs, or gallops.  ABDOMEN: Soft, nontender, nondistended. Bowel sounds present. No organomegaly or mass.  EXTREMITIES: No pedal edema, cyanosis, or clubbing. + 2 pedal & radial pulses b/l.   NEUROLOGIC: Cranial nerves II through XII are intact. No focal Motor or sensory deficits appreciated b/l PSYCHIATRIC: The patient is alert and oriented x 3. Good affect.  SKIN:  No obvious rash, lesion, or ulcer.   LABORATORY PANEL:   CBC Recent Labs  Lab 10/13/18 1326  WBC 8.3  HGB 12.7  HCT 43.0  PLT 395   ------------------------------------------------------------------------------------------------------------------  Chemistries  Recent Labs  Lab 10/13/18 1411  NA 140  K 5.1  CL 104  CO2 24  GLUCOSE 82  BUN 12  CREATININE <0.30*  CALCIUM 9.2   ------------------------------------------------------------------------------------------------------------------  Cardiac Enzymes Recent Labs  Lab 10/13/18 1411  TROPONINI <0.03   ------------------------------------------------------------------------------------------------------------------  RADIOLOGY:  Dg Chest Portable 1 View  Result Date: 10/13/2018 CLINICAL DATA:  Increasing shortness of breath. History of COPD. Productive cough. EXAM: PORTABLE CHEST 1 VIEW COMPARISON:  Chest x-rays dated 09/09/2016 and 09/07/2016. FINDINGS: New dense opacity at the medial aspects of the LEFT lower lung, with evidence of associated volume loss suggesting airspace collapse, similar to the older study of 09/07/2016. lungs otherwise clear bilaterally. Heart size appears normal. No acute or suspicious osseous finding. IMPRESSION: 1. New dense opacity at the medial aspects of the LEFT lower lung, with evidence of associated volume loss suggesting airspace collapse, similar to the older study of 09/07/2016 suggesting recurrent airspace collapse. 2. Lungs otherwise clear. Electronically Signed  By: Bary RichardStan  Maynard M.D.   On: 10/13/2018 13:08   IMPRESSION AND PLAN:   *Left lower lobe lung collapse with possible pneumonia Patient will be admitted to medical floor.  Start IV antibiotics.  Incentive spirometry and nebulizers ordered.  Mucinex. Will order repeat chest x-ray in the morning.  If no improvement in the collapse will need chest physiotherapy. We will check procalcitonin level  *Chronic pain syndrome.   Continue home medications  *Depression.  On home medications.  DVT prophylaxis with Lovenox  All the records are reviewed and case discussed with ED provider. Management plans discussed with the patient, family and they are in agreement.  CODE STATUS: FULL CODE  TOTAL TIME TAKING CARE OF THIS PATIENT: 40 minutes.   Orie FishermanSrikar R Chamya Hunton M.D on 10/13/2018 at 3:05 PM  Between 7am to 6pm - Pager - [phone]  After 6pm go to [email or web address] - password [email or web address] [email or web address]  [facility]  Office  [phone]  CC: Primary care physician; [place], Julie A, MD  Note: This dictation was prepared with Dragon dictation along with smaller phrase technology. Any transcriptional errors that result from this process are unintentional.

## 2018-10-13 NOTE — ED Triage Notes (Signed)
Pt to ER via EMS from home with c/o increasing SHOB.  Pt with hx of COPD, states she took her inhalers this AM with no improvement.  Pt noted to have productive cough with clear sputum.

## 2018-10-13 NOTE — Progress Notes (Signed)
Advance care planning  Purpose of Encounter Pneumonia  Parties in Attendance Patient  Patients Decisional capacity Alert and oriented.  Able to make medical decisions. No documented healthcare power of attorney.  She mentions that she would like her sister to make decision if she is unable to at any point.  No ACP documents in place  Cussed regarding patient's lung collapse, pneumonia, treatment plan and prognosis.  All questions answered.  CODE STATUS discussed and patient requests aggressive treatment including intubation, CPR and defibrillation if needed.  She has had this discussion with her sister and doctors in the past.  Orders entered.  CODE STATUS changed.  FULL CODE   Time spent - 17 minutes

## 2018-10-13 NOTE — ED Notes (Signed)
MD Sudini at bedside. 

## 2018-10-13 NOTE — ED Provider Notes (Signed)
[facility] Emergency Department Provider Note  ____________________________________________   I have reviewed the triage vital signs and the nursing notes. Where available I have reviewed prior notes and, if possible and indicated, outside hospital notes.    HISTORY  Chief Complaint Shortness of Breath    HPI [name] is a 53 y.o. 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  She states she is been having a productive cough she is having trouble activating and shortness of breath over the last couple days.  She states that she is not had a fever chills.  Denies diarrhea.  No vomiting.  Not on home oxygen.  No other prior symptoms no other alleviating or aggravating factors no chest pain  *   Past Medical History:  Diagnosis Date  . COPD (chronic obstructive pulmonary disease) (HCC)   . History of urostomy   . Paraplegic spinal paralysis [facility])     Patient Active Problem List   Diagnosis Date Noted  . Pneumonia 09/07/2016  . Pressure injury of skin 09/07/2016    History reviewed. No pertinent surgical history.  Prior to Admission medications   Medication Sig Start Date End Date Taking? Authorizing Provider  Albuterol Sulfate 108 (90 Base) MCG/ACT AEPB Inhale 1 puff into the lungs every 4 (four) hours as needed.    [provider]  azithromycin (ZITHROMAX) 250 MG tablet Take daily as directed 09/09/16   Enedina Finner, MD  baclofen (LIORESAL) 10 MG tablet Take 10 mg by mouth 3 (three) times daily.    [provider]  beclomethasone (QVAR) 40 MCG/ACT inhaler Inhale 2 puffs into the lungs 2 (two) times daily.    [provider]  brompheniramine-pseudoephedrine-DM 30-2-10 MG/5ML syrup Take 5 mLs by mouth 4 (four) times daily as needed. Patient  not taking: Reported on 09/07/2016 03/29/15   Joni Reining, PA-C  cefUROXime (CEFTIN) 500 MG tablet Take 1 tablet (500 mg total) by mouth 2 (two) times daily with a meal. 09/09/16   Enedina Finner, MD  feeding supplement, ENSURE ENLIVE, (ENSURE ENLIVE) LIQD Take 237 mLs by mouth 3 (three) times daily between meals. 09/09/16   Enedina Finner, MD  fluticasone (FLONASE) 50 MCG/ACT nasal spray Place 2 sprays into both nostrils daily. 08/27/15   [provider]  gabapentin (NEURONTIN) 100 MG capsule Take 100 mg by mouth 3 (three) times daily. 08/27/15   [provider]  metoprolol tartrate (LOPRESSOR) 25 MG tablet Take 0.5 tablets (12.5 mg total) by mouth 2 (two) times daily. 09/09/16   Enedina Finner, MD  mirtazapine (REMERON) 30 MG tablet Take 30 mg by mouth at bedtime. 05/24/16   [provider]  nicotine (NICODERM CQ - DOSED IN MG/24 HOURS) 21 mg/24hr patch Place 21 mg onto the skin daily. 07/10/16   [provider]  sulfamethoxazole-trimethoprim (BACTRIM DS,SEPTRA DS) 800-160 MG per tablet Take 1 tablet by mouth 2 (two) times daily. Patient not taking: Reported on 09/07/2016 03/29/15   Joni Reining, PA-C  tiotropium (SPIRIVA HANDIHALER) 18 MCG inhalation capsule Place 18 mcg into inhaler and inhale daily. 08/18/16   [provider]  traMADol (ULTRAM) 50 MG tablet Take 1 tablet (50 mg total) by mouth every 6 (six) hours as needed for moderate pain. Patient not taking: Reported on 09/07/2016 03/29/15   Nona Dell  K, PA-C    Allergies Shellfish allergy  History reviewed. No pertinent family history.  Social History Social History   Tobacco Use  . Smoking status: Never Smoker  . Smokeless tobacco: Never Used  Substance Use Topics  . Alcohol use: No  . Drug use: Not on file    Review of Systems Constitutional: No fever/chills Eyes: No visual changes. ENT: No sore throat. No stiff neck no neck pain Cardiovascular: Denies chest pain. Respiratory: Denies  shortness of breath. Gastrointestinal:   no vomiting.  No diarrhea.  No constipation. Genitourinary: Negative for dysuria. Musculoskeletal: Negative lower extremity swelling Skin: Negative for rash. Neurological: Negative for severe headaches, focal weakness or numbness.   ____________________________________________   PHYSICAL EXAM:  VITAL SIGNS: ED Triage Vitals  Enc Vitals Group     BP 10/13/18 1222 131/72     Pulse Rate 10/13/18 1222 72     Resp 10/13/18 1222 20     Temp 10/13/18 1222 98.4 F (36.9 C)     Temp src --      SpO2 10/13/18 1222 97 %     Weight 10/13/18 1223 83 lb (37.6 kg)     Height 10/13/18 1223  (1.6 m)     Head Circumference --      Peak Flow --      Pain Score 10/13/18 1223 0     Pain Loc --      Pain Edu? --      Excl. in GC? --     Constitutional: Alert and oriented. Well appearing and in no acute distress. Eyes: Conjunctivae are normal Head: Atraumatic HEENT: No congestion/rhinnorhea. Mucous membranes are moist.  Oropharynx non-erythematous Neck:   Nontender with no meningismus, no masses, no stridor Cardiovascular: Normal rate, regular rhythm. Grossly normal heart sounds.  Good peripheral circulation. Respiratory: Somewhat of a weak cough which is her baseline anticipated from prior notes, diffuse rhonchi, patient has some trouble clearing them.  This sounds like large airway noise mostly.  No wheezes. Abdominal: Soft and nontender. No distention. No guarding no rebound Back:  There is no focal tenderness or step off.  there is no midline tenderness there are no lesions noted. there is no CVA tenderness Musculoskeletal: No lower extremity tenderness, no upper extremity tenderness. No joint effusions, no DVT signs strong distal pulses no edema Neurologic: Patient is quadriplegic.   ____________________________________________   LABS (all labs ordered are listed, but only abnormal results are displayed)  Labs Reviewed  CULTURE, BLOOD  (ROUTINE X 2)  CULTURE, BLOOD (ROUTINE X 2)  CBC WITH DIFFERENTIAL/PLATELET  SALICYLATE LEVEL  TROPONIN I  BASIC METABOLIC PANEL    Pertinent labs  results that were available during my care of the patient were reviewed by me and considered in my medical decision making (see chart for details). ____________________________________________  EKG  I personally interpreted any EKGs ordered by me or triage Normal sinus rhythm at 72 bpm no acute ST elevation or depression nonspecific ST changes wandering baseline limits interpretation to some extent ____________________________________________  RADIOLOGY  Pertinent labs & imaging results that were available during my care of the patient were reviewed by me and considered in my medical decision making (see chart for details). If possible, patient and/or family made aware of any abnormal findings.  No results found. ____________________________________________    PROCEDURES  Procedure(s) performed: None  Procedures  Critical Care performed: CRITICAL CARE Performed by: Jeanmarie Plant   Total critical care time: 38 minutes  Critical care time was exclusive of separately billable procedures and treating other patients.  Critical care was necessary to treat or prevent imminent or life-threatening deterioration.  Critical care was time spent personally by me on the following activities: development of treatment plan with patient and/or surrogate as well as nursing, discussions with consultants, evaluation of patient's response to treatment, examination of patient, obtaining history from patient or surrogate, ordering and performing treatments and interventions, ordering and review of laboratory studies, ordering and review of radiographic studies, pulse oximetry and re-evaluation of patient's condition.   ____________________________________________   INITIAL IMPRESSION / ASSESSMENT AND PLAN / ED COURSE  Pertinent labs & imaging  results that were available during my care of the patient were reviewed by me and considered in my medical decision making (see chart for details).  Patient here with cough and diffuse rhonchi again unfortunately chest x-ray shows similar pathology that she had during her last admission which cleared with bronchodilators which I have initiated here.  We will admit the patient.  I will give her hospital-acquired pneumonia antibiotics, blood work is pending, we are providing the patient with suction and sitting her up I will give her inhalers we are instructed to try to avoid using nebulizer treatments in the context of the coronavirus pandemic, this may be indicated if inhalers do not help.  Not auscultated wheeze.    ____________________________________________   FINAL CLINICAL IMPRESSION(S) / ED DIAGNOSES  Final diagnoses:  None      This chart was dictated using voice recognition software.  Despite best efforts to proofread,  errors can occur which can change meaning.      Jeanmarie PlantMcShane, Havannah Streat A, MD 10/13/18 1325

## 2018-10-13 NOTE — Progress Notes (Signed)
Anticoagulation monitoring(Lovenox):  53yo  female ordered Lovenox 40 mg Q24h for DVT prevention.  Filed Weights   10/13/18 1223  Weight: 83 lb (37.6 kg)   BMI 14.7   Lab Results  Component Value Date   CREATININE <0.30 (L) 10/13/2018   CREATININE <0.30 (L) 09/09/2016   CREATININE 0.34 (L) 09/08/2016   CrCl cannot be calculated (This lab value cannot be used to calculate CrCl because it is not a number: <0.30). Hemoglobin & Hematocrit     Component Value Date/Time   HGB 12.7 10/13/2018 1326   HCT 43.0 10/13/2018 1326     Per Protocol for Patient less than 45 kg will adjust dose to 30mg  q24h.     Clovia Cuff, PharmD, BCPS 10/13/2018 3:44 PM

## 2018-10-13 NOTE — ED Notes (Signed)
ED TO INPATIENT HANDOFF REPORT  ED Nurse Name and Phone #: Victorino Dike [phone]  S Name/Age/Gender [name] 53 y.o. female Room/Bed: ED34A/ED34A  Code Status   Code Status: Full Code  Home/SNF/Other Home Patient oriented to: self, place, time and situation Is this baseline? Yes   Triage Complete: Triage complete  Chief Complaint SOB  Triage Note Pt to ER via EMS from home with c/o increasing SHOB.  Pt with hx of COPD, states she took her inhalers this AM with no improvement.  Pt noted to have productive cough with clear sputum.   Allergies Allergies  Allergen Reactions  . Shellfish Allergy Shortness Of Breath    Level of Care/Admitting Diagnosis ED Disposition    ED Disposition Condition Comment   Admit  Hospital Area: [facility] [[id number]]  Level of Care: Med-Surg [16]  Diagnosis: Pneumonia [227785]  Admitting Physician: Milagros Loll [[id number]]  Attending Physician: Milagros Loll [[id number]]  Estimated length of stay: past midnight tomorrow  Certification:: I certify this patient will need inpatient services for at least 2 midnights  PT Class (Do Not Modify): Inpatient [101]  PT Acc Code (Do Not Modify): Private [1]       B Medical/Surgery History Past Medical History:  Diagnosis Date  . COPD (chronic obstructive pulmonary disease) (HCC)   . History of urostomy   . Paraplegic spinal paralysis [facility])    History reviewed. No pertinent surgical history.   A IV Location/Drains/Wounds Patient Lines/Drains/Airways Status   Active Line/Drains/Airways    Name:   Placement date:   Placement time:   Site:   Days:   Peripheral IV 10/13/18 Right Hand   10/13/18    1317    Hand   less than 1   Pressure Injury 09/07/16 Stage II -  Partial thickness loss of dermis presenting as a shallow open ulcer with a red, pink wound bed without slough.   09/07/16    1030     766   Pressure Injury 09/07/16 Stage III -  Full thickness tissue loss. Subcutaneous  fat may be visible but bone, tendon or muscle are NOT exposed.   09/07/16    1030     766   Pressure Injury 09/07/16   09/07/16    1030     766          Intake/Output Last 24 hours No intake or output data in the 24 hours ending 10/13/18 1455  Labs/Imaging Results for orders placed or performed during the hospital encounter of 10/13/18 (from the past 48 hour(s))  CBC with Differential     Status: Abnormal   Collection Time: 10/13/18  1:26 PM  Result Value Ref Range   WBC 8.3 4.0 - 10.5 K/uL   RBC 4.59 3.87 - 5.11 MIL/uL   Hemoglobin 12.7 12.0 - 15.0 g/dL   HCT 10.2 72.5 - 36.6 %   MCV 93.7 80.0 - 100.0 fL   MCH 27.7 26.0 - 34.0 pg   MCHC 29.5 (L) 30.0 - 36.0 g/dL   RDW 44.0 (H) 34.7 - 42.5 %   Platelets 395 150 - 400 K/uL   nRBC 0.0 0.0 - 0.2 %   Neutrophils Relative % 71 %   Neutro Abs 5.9 1.7 - 7.7 K/uL   Lymphocytes Relative 19 %   Lymphs Abs 1.6 0.7 - 4.0 K/uL   Monocytes Relative 7 %   Monocytes Absolute 0.5 0.1 - 1.0 K/uL   Eosinophils Relative 2 %   Eosinophils  Absolute 0.2 0.0 - 0.5 K/uL   Basophils Relative 0 %   Basophils Absolute 0.0 0.0 - 0.1 K/uL   Immature Granulocytes 1 %   Abs Immature Granulocytes 0.05 0.00 - 0.07 K/uL    Comment: Performed at [facility], [address] [place], [place] [address]  Salicylate level     Status: None   Collection Time: 10/13/18  2:11 PM  Result Value Ref Range   Salicylate Lvl <7.0 2.8 - 30.0 mg/dL    Comment: Performed at [facility], [address] [place], [place] [address]   Dg Chest Portable 1 View  Result Date: 10/13/2018 CLINICAL DATA:  Increasing shortness of breath. History of COPD. Productive cough. EXAM: PORTABLE CHEST 1 VIEW COMPARISON:  Chest x-rays dated 09/09/2016 and 09/07/2016. FINDINGS: New dense opacity at the medial aspects of the LEFT lower lung, with evidence of associated volume loss suggesting airspace collapse, similar to the older study of 09/07/2016. lungs otherwise clear  bilaterally. Heart size appears normal. No acute or suspicious osseous finding. IMPRESSION: 1. New dense opacity at the medial aspects of the LEFT lower lung, with evidence of associated volume loss suggesting airspace collapse, similar to the older study of 09/07/2016 suggesting recurrent airspace collapse. 2. Lungs otherwise clear. Electronically Signed   By: Bary RichardStan  Maynard M.D.   On: 10/13/2018 13:08    Pending Labs Unresulted Labs (From admission, onward)    Start     Ordered   10/20/18 0500  Creatinine, serum  (enoxaparin (LOVENOX)    CrCl >/= 30 ml/min)  Weekly,   STAT    Comments:  while on enoxaparin therapy    10/13/18 1437   10/13/18 1439  Gram stain  Once,   STAT     10/13/18 1439   10/13/18 1439  Strep pneumoniae urinary antigen  Once,   STAT     10/13/18 1439   10/13/18 1433  Culture, sputum-assessment  Once,   STAT    Question:  Patient immune status  Answer:  Normal   10/13/18 1437   10/13/18 1426  Brain natriuretic peptide  Add-on,   AD     10/13/18 1425   10/13/18 1400  Basic metabolic panel  Once,   STAT     10/13/18 1400   10/13/18 1400  Troponin I -  Once,   STAT     10/13/18 1400   10/13/18 1253  Culture, blood (routine x 2)  BLOOD CULTURE X 2,   STAT     10/13/18 1252          Vitals/Pain Today's Vitals   10/13/18 1222 10/13/18 1223 10/13/18 1227  BP: 131/72    Pulse: 72    Resp: 20    Temp: 98.4 F (36.9 C)    SpO2: 97%  90%  Weight:  37.6 kg   Height:  5\' 3"  (1.6 m)   PainSc:  0-No pain     Isolation Precautions No active isolations  Medications Medications  albuterol (PROVENTIL HFA;VENTOLIN HFA) 108 (90 Base) MCG/ACT inhaler 2 puff (2 puffs Inhalation Given 10/13/18 1349)  vancomycin (VANCOCIN) IVPB 1000 mg/200 mL premix (has no administration in time range)  enoxaparin (LOVENOX) injection 40 mg (has no administration in time range)  acetaminophen (TYLENOL) tablet 650 mg (has no administration in time range)    Or  acetaminophen (TYLENOL)  suppository 650 mg (has no administration in time range)  ibuprofen (ADVIL,MOTRIN) tablet 400 mg (has no administration in time range)  polyethylene  glycol (MIRALAX / GLYCOLAX) packet 17 g (has no administration in time range)  ondansetron (ZOFRAN) tablet 4 mg (has no administration in time range)    Or  ondansetron (ZOFRAN) injection 4 mg (has no administration in time range)  albuterol (PROVENTIL) (2.5 MG/3ML) 0.083% nebulizer solution 2.5 mg (has no administration in time range)  guaiFENesin (MUCINEX) 12 hr tablet 600 mg (has no administration in time range)  levofloxacin (LEVAQUIN) IVPB 750 mg (has no administration in time range)  ceFEPIme (MAXIPIME) 1 g in sodium chloride 0.9 % 100 mL IVPB (0 g Intravenous Stopped 10/13/18 1455)    Mobility non-ambulatory Moderate fall risk   Focused Assessments Pulmonary Assessment Handoff:  Lung sounds:   O2 Device: Room Air O2 Flow Rate (L/min): 3 L/min      R Recommendations: See Admitting Provider Note  Report given to:   Additional Notes:

## 2018-10-13 NOTE — ED Notes (Signed)
Unable to obtain Blood cultures at this time as pt is difficult stick.  Per Dr. Alphonzo Lemmings proceed with Abx, no cultures needed.

## 2018-10-13 NOTE — Progress Notes (Signed)
Pharmacy Antibiotic Note  LORITA SURI is a 53 y.o. female admitted on 10/13/2018 with pneumonia.  Pharmacy has been consulted for Vancomycin dosing.  Plan: Vancomycin 1000 mg IV Q 36 hrs. Goal AUC 400-550. Expected AUC: 553.8 Css: 9.8 T1/2: 15.6 hours SCr used: 0.8(actual<0.3)   Height: 5\' 3"  (160 cm) Weight: 83 lb (37.6 kg) IBW/kg (Calculated) : 52.4  Temp (24hrs), Avg:99.1 F (37.3 C), Min:98.4 F (36.9 C), Max:99.8 F (37.7 C)  Recent Labs  Lab 10/13/18 1326 10/13/18 1411  WBC 8.3  --   CREATININE  --  <0.30*    CrCl cannot be calculated (This lab value cannot be used to calculate CrCl because it is not a number: <0.30).    Allergies  Allergen Reactions  . Shellfish Allergy Shortness Of Breath    Antimicrobials this admission: Vancomycin 4/12 >>  Levofloxacin 4/12 >>  Cefepime x 1 dose  Dose adjustments this admission:   Microbiology results: 4/12 BCx: pending 4/12 MRSA PCR: (+)  Thank you for allowing pharmacy to be a part of this patient's care.  Bettey Costa 10/13/2018 6:48 PM

## 2018-10-14 ENCOUNTER — Inpatient Hospital Stay: Payer: Medicare Other

## 2018-10-14 LAB — STREP PNEUMONIAE URINARY ANTIGEN: Strep Pneumo Urinary Antigen: NEGATIVE

## 2018-10-14 NOTE — Consult Note (Signed)
WOC Nurse wound consult note Reason for Consult:Stage 4 pressure injury to sacrum.  unstageable pressure injury right ischium.  Has clinitron bed at home she reports.  Will recommend mattress with low air loss feature for pressure redistribution.  Wound type:pressure  Pressure Injury POA: Yes Measurement: Right ischium:  2 cm x 2.3 cm x 2 cm  Sacrum:  6 cm x 5.4 cm x 0.4 cm with undermining circumferentially that extends 0.5 cm  Wound CHE:KBTC pink nongranulating Drainage (amount, consistency, odor) moderate serosanguinous.  Musty odor.  Periwound: 1 cm round patch of eschar at 6 o'clock to sacral wound.   Dressing procedure/placement/frequency: Cleanse wounds to right ischium and sacrum with NS.  Apply Aquacel Ag to wound bed, be sure to fill in undermining and wound depth.  Cover with gauze and ABD pad.  Change M/W/F and PRn soilage.  Will not follow at this time.  Please re-consult if needed.  Maple Hudson MSN, RN, FNP-BC CWON Wound, Ostomy, Continence Nurse Pager [phone]

## 2018-10-15 MED ORDER — LEVOFLOXACIN 750 MG PO TABS: 750.0000 mg | ORAL_TABLET | Freq: Every day | ORAL | Status: DC

## 2018-10-15 MED ORDER — LEVOFLOXACIN 750 MG PO TABS: 750.0000 mg | ORAL_TABLET | Freq: Every day | ORAL | 0 refills | Status: AC

## 2018-10-15 MED ORDER — GABAPENTIN 100 MG PO CAPS: 100.0000 mg | ORAL_CAPSULE | Freq: Three times a day (TID) | ORAL | 0 refills | Status: AC

## 2018-10-15 MED ORDER — METOPROLOL TARTRATE 25 MG PO TABS: 12.5000 mg | ORAL_TABLET | Freq: Two times a day (BID) | ORAL | 0 refills | Status: AC

## 2018-10-15 MED ORDER — GUAIFENESIN ER 600 MG PO TB12: 600.0000 mg | ORAL_TABLET | Freq: Two times a day (BID) | ORAL | 0 refills | Status: AC

## 2018-10-15 MED ORDER — BACLOFEN 10 MG PO TABS: 10.0000 mg | ORAL_TABLET | Freq: Three times a day (TID) | ORAL | 0 refills | Status: AC

## 2018-10-15 NOTE — Progress Notes (Signed)
[facility] [place] [facility]   PATIENT NAME: [name] LoganCharlotte [name]    MR#:  [id number]  DATE OF BIRTH:  1965/07/25  SUBJECTIVE:  CHIEF COMPLAINT:   Chief Complaint  Patient presents with  . Shortness of Breath   Came with SOB, noted to have PNA, feels better. REVIEW OF SYSTEMS:  CONSTITUTIONAL: No fever, fatigue or weakness.  EYES: No blurred or double vision.  EARS, NOSE, AND THROAT: No tinnitus or ear pain.  RESPIRATORY: had cough, shortness of breath,no wheezing or hemoptysis.  CARDIOVASCULAR: No chest pain, orthopnea, edema.  GASTROINTESTINAL: No nausea, vomiting, diarrhea or abdominal pain.  GENITOURINARY: No dysuria, hematuria.  ENDOCRINE: No polyuria, nocturia,  HEMATOLOGY: No anemia, easy bruising or bleeding SKIN: No rash or lesion. MUSCULOSKELETAL: No joint pain or arthritis.   NEUROLOGIC: No tingling, numbness, weakness.  PSYCHIATRY: No anxiety or depression.   ROS  DRUG ALLERGIES:   Allergies  Allergen Reactions  . Shellfish Allergy Shortness Of Breath    VITALS:  Blood pressure 93/61, pulse 79, temperature 98.4 F (36.9 C), temperature source Oral, resp. rate 18, height 5\' 3"  (1.6 m), weight 37.6 kg, SpO2 96 %.  PHYSICAL EXAMINATION:  GENERAL:  53 y.o.-year-old patient lying in the bed with no acute distress.  EYES: Pupils equal, round, reactive to light and accommodation. No scleral icterus. Extraocular muscles intact.  HEENT: Head atraumatic, normocephalic. Oropharynx and nasopharynx clear.  NECK:  Supple, no jugular venous distention. No thyroid enlargement, no tenderness.  LUNGS: Normal breath sounds bilaterally, no wheezing, some crepitation. No use of accessory muscles of respiration.  CARDIOVASCULAR: S1, S2 normal. No murmurs, rubs, or gallops.  ABDOMEN: Soft, nontender, nondistended. Bowel sounds present. No organomegaly or mass.  EXTREMITIES: No pedal edema, cyanosis, or clubbing. Atrophic muscles. NEUROLOGIC: Cranial nerves II  through XII are intact. Muscle strength 3/5 in upper extremities 0/5 in LL. Sensation intact. Gait not checked.  PSYCHIATRIC: The patient is alert and oriented x 3.  SKIN: No obvious rash, lesion, or ulcer.   Physical Exam LABORATORY PANEL:   CBC Recent Labs  Lab 10/13/18 1326  WBC 8.3  HGB 12.7  HCT 43.0  PLT 395   ------------------------------------------------------------------------------------------------------------------  Chemistries  Recent Labs  Lab 10/13/18 1411  NA 140  K 5.1  CL 104  CO2 24  GLUCOSE 82  BUN 12  CREATININE <0.30*  CALCIUM 9.2   ------------------------------------------------------------------------------------------------------------------  Cardiac Enzymes Recent Labs  Lab 10/13/18 1411  TROPONINI <0.03   ------------------------------------------------------------------------------------------------------------------  RADIOLOGY:  X-ray Chest Pa And Lateral  Result Date: 10/14/2018 CLINICAL DATA:  Follow-up pneumonia. COPD. EXAM: CHEST - 2 VIEW COMPARISON:  10/13/2018 FINDINGS: Patient is rotated to the left. Persistent atelectasis or infiltrate is seen in the posterior left lower lobe. Right lung is clear. Pulmonary hyperinflation again seen, consistent with COPD. Heart size is within normal limits allowing for patient positioning. IMPRESSION: 1. Persistent atelectasis or infiltrate in posterior left lower lobe. 2. COPD. Electronically Signed   By: Myles RosenthalJohn  Stahl M.D.   On: 10/14/2018 08:43   Dg Chest Portable 1 View  Result Date: 10/13/2018 CLINICAL DATA:  Increasing shortness of breath. History of COPD. Productive cough. EXAM: PORTABLE CHEST 1 VIEW COMPARISON:  Chest x-rays dated 09/09/2016 and 09/07/2016. FINDINGS: New dense opacity at the medial aspects of the LEFT lower lung, with evidence of associated volume loss suggesting airspace collapse, similar to the older study of 09/07/2016. lungs otherwise clear bilaterally. Heart size  appears normal. No acute or suspicious osseous finding.  IMPRESSION: 1. New dense opacity at the medial aspects of the LEFT lower lung, with evidence of associated volume loss suggesting airspace collapse, similar to the older study of 09/07/2016 suggesting recurrent airspace collapse. 2. Lungs otherwise clear. Electronically Signed   By: Bary Richard M.D.   On: 10/13/2018 13:08    ASSESSMENT AND PLAN:   Active Problems:   Pneumonia  *Left lower lobe lung collapse with possible pneumonia  IV antibiotics. Incentive spirometry and nebulizers ordered.  Mucinex.  repeat chest x-ray in the morning. Low procalcitonin level  *Chronic pain syndrome.  Continue home medications  *Depression.  On home medications.  * paraplegic due to s/p stroke At home, family helps to move bed to wheel chair.  *DVT prophylaxis with Lovenox    All the records are reviewed and case discussed with Care Management/Social Workerr. Management plans discussed with the patient, family and they are in agreement.  CODE STATUS: full.  TOTAL TIME TAKING CARE OF THIS PATIENT: 35 minutes.     POSSIBLE D/C IN 1-2 DAYS, DEPENDING ON CLINICAL CONDITION.   Altamese Dilling M.D on 10/15/2018   Between 7am to 6pm - Pager - [phone]  After 6pm go to [email or web address] - password [email or web address]  [facility]  Office  [phone]  CC: Primary care physician; [place], Julie A, MD  Note: This dictation was prepared with Dragon dictation along with smaller phrase technology. Any transcriptional errors that result from this process are unintentional.

## 2018-10-15 NOTE — Progress Notes (Signed)
Patient is alert and oriented and able to verbalize needs. No complaints at this time. PIV removed. VSS. Printed AVS given to patient in discharge packet. All belongings packed. This nurse called friend, Ray for transport home.

## 2018-10-15 NOTE — Discharge Summary (Signed)
[facility] [place] at [facility]   PATIENT NAME: [name]    MR#:  [id number]  DATE OF BIRTH:  [birth year]  DATE OF ADMISSION:  10/13/2018 ADMITTING PHYSICIAN: Milagros LollSrikar Sudini, MD  DATE OF DISCHARGE: 10/15/2018  PRIMARY CARE PHYSICIAN: [place], Julie A, MD    ADMISSION DIAGNOSIS:  Collapse of left lung [J98.11] HCAP (healthcare-associated pneumonia) [J18.9]  DISCHARGE DIAGNOSIS:  Active Problems:   Pneumonia  SECONDARY DIAGNOSIS:   Past Medical History:  Diagnosis Date  . COPD (chronic obstructive pulmonary disease) (HCC)   . History of urostomy   . Paraplegic spinal paralysis [facility](HCC)     HOSPITAL COURSE:   *Left lower lobe lung collapse with possible pneumonia  IV antibiotics. Incentive spirometry and nebulizers ordered. Mucinex.  repeat chest x-ray showed some clearing. Low procalcitonin level  *Chronic pain syndrome. Continue home medications  *Depression. On home medications.  * paraplegic due to s/p stroke At home, family helps to move bed to wheel chair.  *DVT prophylaxis with Lovenox.  DISCHARGE CONDITIONS:   Stable.  CONSULTS OBTAINED:    DRUG ALLERGIES:   Allergies  Allergen Reactions  . Shellfish Allergy Shortness Of Breath    DISCHARGE MEDICATIONS:   Allergies as of 10/15/2018      Reactions   Shellfish Allergy Shortness Of Breath      Medication List    TAKE these medications   Albuterol Sulfate 108 (90 Base) MCG/ACT Aepb Inhale 1 puff into the lungs every 4 (four) hours as needed.   baclofen 10 MG tablet Commonly known as:  LIORESAL Take 1 tablet (10 mg total) by mouth 3 (three) times daily. What changed:  Another medication with the same name was removed. Continue taking this medication, and follow the directions you see here.   feeding supplement (ENSURE ENLIVE) Liqd Take 237 mLs by mouth 3 (three) times daily between meals.   fluticasone 50 MCG/ACT nasal spray Commonly known as:   FLONASE Place 2 sprays into both nostrils daily.   gabapentin 100 MG capsule Commonly known as:  NEURONTIN Take 1 capsule (100 mg total) by mouth 3 (three) times daily.   guaiFENesin 600 MG 12 hr tablet Commonly known as:  MUCINEX Take 1 tablet (600 mg total) by mouth 2 (two) times daily.   levofloxacin 750 MG tablet Commonly known as:  LEVAQUIN Take 1 tablet (750 mg total) by mouth daily for 3 days.   metoprolol tartrate 25 MG tablet Commonly known as:  LOPRESSOR Take 0.5 tablets (12.5 mg total) by mouth 2 (two) times daily.   mirtazapine 30 MG tablet Commonly known as:  REMERON Take 30 mg by mouth at bedtime.   oxyCODONE-acetaminophen 10-325 MG tablet Commonly known as:  PERCOCET Take 1 tablet by mouth 3 (three) times daily.   Qvar 40 MCG/ACT inhaler Generic drug:  beclomethasone Inhale 2 puffs into the lungs 2 (two) times daily.   Spiriva HandiHaler 18 MCG inhalation capsule Generic drug:  tiotropium Place 18 mcg into inhaler and inhale daily.   Vitamin C 500 MG Chew Chew 500 mg by mouth daily.        DISCHARGE INSTRUCTIONS:    Follow with PMD in 1 week.  If you experience worsening of your admission symptoms, develop shortness of breath, life threatening emergency, suicidal or homicidal thoughts you must seek medical attention immediately by calling 911 or calling your MD immediately  if symptoms less severe.  You Must read complete instructions/literature along with all the possible adverse reactions/side  effects for all the Medicines you take and that have been prescribed to you. Take any new Medicines after you have completely understood and accept all the possible adverse reactions/side effects.   Please note  You were cared for by a hospitalist during your hospital stay. If you have any questions about your discharge medications or the care you received while you were in the hospital after you are discharged, you can call the unit and asked to speak with  the hospitalist on call if the hospitalist that took care of you is not available. Once you are discharged, your primary care physician will handle any further medical issues. Please note that NO REFILLS for any discharge medications will be authorized once you are discharged, as it is imperative that you return to your primary care physician (or establish a relationship with a primary care physician if you do not have one) for your aftercare needs so that they can reassess your need for medications and monitor your lab values.    Today   CHIEF COMPLAINT:   Chief Complaint  Patient presents with  . Shortness of Breath    HISTORY OF PRESENT ILLNESS:  [name]  is a 53 y.o. female with a known history of paraplegia, COPD, tobacco use presents to the emergency room complaining of worsening shortness of breath over the last 2 days.  Patient has had a cough but no fever.  Patient was here in March 2020 with similar complaints and was found to have left lower lobe collapse from mucous plugging.  She responded well to pulmonary toilet, nebs and incentive spirometry.  Seems to have similar findings on chest x-ray at this time.  It is unclear if patient has pneumonia or not.  Saturations were 90% on room air with tachypnea and placed on 2 L oxygen.  Started on IV antibiotics. Patient has not left her house in many days.   VITAL SIGNS:  Blood pressure 109/62, pulse 97, temperature 100.1 F (37.8 C), temperature source Oral, resp. rate 18, height  (1.6 m), weight 37.6 kg, SpO2 97 %.  I/O:    Intake/Output Summary (Last 24 hours) at 10/15/2018 1337 Last data filed at 10/14/2018 1700 Gross per 24 hour  Intake 191 ml  Output -  Net 191 ml    PHYSICAL EXAMINATION:   GENERAL:  53 y.o.-year-old patient lying in the bed with no acute distress.  EYES: Pupils equal, round, reactive to light and accommodation. No scleral icterus. Extraocular muscles intact.  HEENT: Head atraumatic,  normocephalic. Oropharynx and nasopharynx clear.  NECK:  Supple, no jugular venous distention. No thyroid enlargement, no tenderness.  LUNGS: Normal breath sounds bilaterally, no wheezing, some crepitation. No use of accessory muscles of respiration.  CARDIOVASCULAR: S1, S2 normal. No murmurs, rubs, or gallops.  ABDOMEN: Soft, nontender, nondistended. Bowel sounds present. No organomegaly or mass.  EXTREMITIES: No pedal edema, cyanosis, or clubbing. Atrophic muscles. NEUROLOGIC: Cranial nerves II through XII are intact. Muscle strength 3/5 in upper extremities 0/5 in LL. Sensation intact. Gait not checked.  PSYCHIATRIC: The patient is alert and oriented x 3.  SKIN: No obvious rash, lesion, or ulcer.   DATA REVIEW:   CBC Recent Labs  Lab 10/13/18 1326  WBC 8.3  HGB 12.7  HCT 43.0  PLT 395    Chemistries  Recent Labs  Lab 10/13/18 1411  NA 140  K 5.1  CL 104  CO2 24  GLUCOSE 82  BUN 12  CREATININE <0.30*  CALCIUM  9.2    Cardiac Enzymes Recent Labs  Lab 10/13/18 1411  TROPONINI <0.03    Microbiology Results  Results for orders placed or performed during the hospital encounter of 10/13/18  Culture, blood (routine x 2)     Status: None (Preliminary result)   Collection Time: 10/13/18  4:05 PM  Result Value Ref Range Status   Specimen Description BLOOD HAND  Final   Special Requests   Final    BOTTLES DRAWN AEROBIC AND ANAEROBIC Blood Culture adequate volume   Culture   Final    NO GROWTH 2 DAYS Performed at [facility], [address] [place], [place] [address]    Report Status PENDING  Incomplete  Culture, blood (routine x 2)     Status: None (Preliminary result)   Collection Time: 10/13/18  4:16 PM  Result Value Ref Range Status   Specimen Description BLOOD HAND  Final   Special Requests   Final    BOTTLES DRAWN AEROBIC AND ANAEROBIC Blood Culture results may not be optimal due to an inadequate volume of blood received in culture bottles    Culture   Final    NO GROWTH 2 DAYS Performed at [facility], [address], [place], [place] [address]    Report Status PENDING  Incomplete    RADIOLOGY:  X-ray Chest Pa And Lateral  Result Date: 10/14/2018 CLINICAL DATA:  Follow-up pneumonia. COPD. EXAM: CHEST - 2 VIEW COMPARISON:  10/13/2018 FINDINGS: Patient is rotated to the left. Persistent atelectasis or infiltrate is seen in the posterior left lower lobe. Right lung is clear. Pulmonary hyperinflation again seen, consistent with COPD. Heart size is within normal limits allowing for patient positioning. IMPRESSION: 1. Persistent atelectasis or infiltrate in posterior left lower lobe. 2. COPD. Electronically Signed   By: Myles Rosenthal M.D.   On: 10/14/2018 08:43    EKG:   Orders placed or performed during the hospital encounter of 10/13/18  . ED EKG  . ED EKG      Management plans discussed with the patient, family and they are in agreement.  CODE STATUS:     Code Status Orders  (From admission, onward)         Start     Ordered   10/13/18 1428  Full code  Continuous     10/13/18 1437        Code Status History    Date Active Date Inactive Code Status Order ID Comments User Context   09/07/2016 1022 09/09/2016 1938 Full Code [id number]  Delfino Lovett, MD Inpatient    Advance Directive Documentation     Most Recent Value  Type of Advance Directive  Healthcare Power of Attorney, Living will  Pre-existing out of facility DNR order (yellow form or pink MOST form)  -  "MOST" Form in Place?  -      TOTAL TIME TAKING CARE OF THIS PATIENT: 35 minutes.    Altamese Dilling M.D on 10/15/2018 at 1:37 PM  Between 7am to 6pm - Pager - [phone]  After 6pm go to [email or web address] - password [email or web address]  [facility] [place] [facility]  Office  [phone]  CC: Primary care physician; [place], Julie A, MD   Note: This dictation was prepared with Dragon dictation along with smaller phrase technology. Any  transcriptional errors that result from this process are unintentional.

## 2018-10-18 LAB — CULTURE, BLOOD (ROUTINE X 2)
Culture: NO GROWTH
Culture: NO GROWTH
Special Requests: ADEQUATE

## 2019-04-08 ENCOUNTER — Encounter: Payer: Medicare Other | Attending: Physician Assistant | Admitting: Physician Assistant

## 2019-04-08 ENCOUNTER — Other Ambulatory Visit: Payer: Self-pay

## 2019-04-08 DIAGNOSIS — G8252 Quadriplegia, C1-C4 incomplete: Secondary | ICD-10-CM | POA: Diagnosis not present

## 2019-04-08 DIAGNOSIS — L89313 Pressure ulcer of right buttock, stage 3: Secondary | ICD-10-CM | POA: Insufficient documentation

## 2019-04-08 DIAGNOSIS — J449 Chronic obstructive pulmonary disease, unspecified: Secondary | ICD-10-CM | POA: Diagnosis not present

## 2019-04-08 DIAGNOSIS — L89323 Pressure ulcer of left buttock, stage 3: Secondary | ICD-10-CM | POA: Insufficient documentation

## 2019-04-08 DIAGNOSIS — E440 Moderate protein-calorie malnutrition: Secondary | ICD-10-CM | POA: Insufficient documentation

## 2019-04-08 DIAGNOSIS — L89213 Pressure ulcer of right hip, stage 3: Secondary | ICD-10-CM | POA: Insufficient documentation

## 2019-04-08 DIAGNOSIS — L89219 Pressure ulcer of right hip, unspecified stage: Secondary | ICD-10-CM | POA: Diagnosis present

## 2019-04-08 NOTE — Progress Notes (Signed)
[name], [name] ([id number]) Visit Report for 04/08/2019 Abuse/Suicide Risk Screen Details Patient Name: [name], [name] Date of Service: 04/08/2019 1:15 PM Medical Record Number: [id number] Patient Account Number: [id number] Date of Birth/Sex: 1966-02-24 (53 y.o. F) Treating RN: [name] Primary Care [name]: [place], [name] Other Clinician: Referring [name]: [place], [name] Treating [name]: STONE [name], [name] Weeks in Treatment: 0 Abuse/Suicide Risk Screen Items Answer ABUSE RISK SCREEN: Has anyone close to you tried to hurt or harm you recentlyo No Do you feel uncomfortable with anyone in your familyo No Has anyone forced you do things that you didnot want to doo No Electronic Signature(s) Signed: 04/08/2019 5:14:16 PM By: [name], BSN, RN, CWS, Kim RN, BSN Entered By: [name], BSN, RN, CWS, Kim on 04/08/2019 13:25:59 [name] ([id number]) -------------------------------------------------------------------------------- Activities of Daily Living Details Patient Name: [name], [name] Date of Service: 04/08/2019 1:15 PM Medical Record Number: [id number] Patient Account Number: [id number] Date of Birth/Sex: May 19, 1966 (53 y.o. F) Treating RN: [name] Primary Care [name]: [place], [name] Other Clinician: Referring [name]: [place], [name] Treating [name]/Extender: STONE [name], [name] Weeks in Treatment: 0 Activities of Daily Living Items Answer Activities of Daily Living (Please select one for each item) Drive Automobile Not Able Take Medications Not Able Use Telephone Not Able Care for Appearance Not Able Use Toilet Not Able [name] / Shower Not Able Dress Self Not Able Feed Self Not Able Walk Not Able Get In / Out Bed Not Able Housework Not Able Prepare Meals Not Able Handle Money Not Able Shop for Self Not Able Electronic Signature(s) Signed: 04/08/2019 5:14:16 PM By: [name], BSN, RN, CWS, Kim RN, BSN Entered By: [name], BSN, RN, CWS, Kim on 04/08/2019  13:26:21 [name] ([id number]) -------------------------------------------------------------------------------- Education Screening Details Patient Name: [name], [name] Date of Service: 04/08/2019 1:15 PM Medical Record Number: [id number] Patient Account Number: [id number] Date of Birth/Sex: Apr 26, 1966 (53 y.o. F) Treating RN: [name] Primary Care [name]: [place], [name] Other Clinician: Referring [name]: [place], [name] Treating [name]: [name], [name] Weeks in Treatment: 0 Learning Preferences/Education Level/Primary Language Learning Preference: Explanation, Demonstration Highest Education Level: High School Preferred Language: English Cognitive Barrier Language Barrier: No Translator Needed: No Memory Deficit: No Emotional Barrier: No Physical Barrier Impaired Vision: No Impaired Hearing: No Knowledge/Comprehension Knowledge Level: High Comprehension Level: High Ability to understand written High instructions: Ability to understand verbal High instructions: Motivation Anxiety Level: Calm Cooperation: Cooperative Education Importance: Acknowledges Need Interest in Health Problems: Asks Questions Perception: Coherent Willingness to Engage in Self- High Management Activities: Readiness to Engage in Self- High Management Activities: Electronic Signature(s) Signed: 04/08/2019 5:14:16 PM By: [name], BSN, RN, CWS, Kim RN, BSN Entered By: [name], BSN, RN, CWS, Kim on 04/08/2019 13:27:45 [name] ([id number]) -------------------------------------------------------------------------------- Fall Risk Assessment Details Patient Name: [name] Date of Service: 04/08/2019 1:15 PM Medical Record Number: [id number] Patient Account Number: [id number] Date of Birth/Sex: 09/14/1965 (53 y.o. F) Treating RN: [name] Primary Care [name]: [place], [name] Other Clinician: Referring [name]: [place], [name] Treating [name]/Extender: [name], [name] Weeks in Treatment: 0 Fall Risk Assessment Items Have you had 2 or more falls in the last 12 monthso 0 No Have you had any fall that resulted in injury in the last 12 monthso 0 No FALLS RISK SCREEN History of falling - immediate or within 3 months 0 No Secondary diagnosis (Do you have 2 or more medical diagnoseso) 0 No Ambulatory aid None/bed rest/wheelchair/nurse 0 No Crutches/cane/walker 0 No Furniture 0 No Intravenous therapy Access/Saline/Heparin [employer]  0 No Gait/Transferring Normal/ bed rest/ wheelchair 0 No Weak (short steps with or without shuffle, stooped but able to lift head while 0 No walking, may seek support from furniture) Impaired (short steps with shuffle, may have difficulty arising from chair, head 0 No down, impaired balance) Mental Status Oriented to own ability 0 No Electronic Signature(s) Signed: 04/08/2019 5:14:16 PM By: [name], BSN, RN, CWS, Kim RN, BSN Entered By: [name], BSN, RN, CWS, Kim on 04/08/2019 13:28:14 [name] ([id number]) -------------------------------------------------------------------------------- Foot Assessment Details Patient Name: [name], [name] Date of Service: 04/08/2019 1:15 PM Medical Record Number: [id number] Patient Account Number: [id number] Date of Birth/Sex: [birth year] (53 y.o. F) Treating RN: [name] Primary Care [name]: [name], [name] Other Clinician: Referring [name]: [name], [name] Treating [name]/Extender: STONE [name], [name] Weeks in Treatment: 0 Foot Assessment Items Site Locations + = Sensation present, - = Sensation absent, C = Callus, U = Ulcer R = Redness, W = Warmth, M = Maceration, PU = Pre-ulcerative lesion F = Fissure, S = Swelling, D = Dryness Assessment Right: Left: Other Deformity: No No Prior Foot Ulcer: No No Prior Amputation: No No Charcot Joint: No No Ambulatory Status: Non-ambulatory Assistance Device: Wheelchair Gait: Engineer, maintenance) Signed: 04/08/2019 5:14:16 PM  By: [name], BSN, RN, CWS, Kim RN, BSN Entered By: [name], BSN, RN, CWS, Kim on 04/08/2019 13:29:24 [name] ([id number]) -------------------------------------------------------------------------------- Nutrition Risk Screening Details Patient Name: [name], [name] Date of Service: 04/08/2019 1:15 PM Medical Record Number: [id number] Patient Account Number: [id number] Date of Birth/Sex: Oct 24, 1965 (53 y.o. F) Treating RN: [name] Primary Care Adri [name]: [name], [name] Other Clinician: Referring Mackenzie Groom: [name], [name] Treating Lady Wisham/Extender: STONE [name], [name] Weeks in Treatment: 0 Height (in): 63 Weight (lbs): 70 Body Mass Index (BMI): 12.4 Nutrition Risk Screening Items Score Screening NUTRITION RISK SCREEN: I have an illness or condition that made me change the kind and/or amount of 0 No food I eat I eat fewer than two meals per day 0 No I eat few fruits and vegetables, or milk products 0 No I have three or more drinks of beer, liquor or wine almost every day 0 No I have tooth or mouth problems that make it hard for me to eat 2 Yes I don't always have enough money to buy the food I need 0 No I eat alone most of the time 0 No I take three or more different prescribed or over-the-counter drugs a day 1 Yes Without wanting to, I have lost or gained 10 pounds in the last six months 0 No I am not always physically able to shop, cook and/or feed myself 0 No Nutrition Protocols [name] Risk Protocol Provide education on Moderate Risk Protocol 0 nutrition High Risk Proctocol Risk Level: Moderate Risk Score: 3 Electronic Signature(s) Signed: 04/08/2019 5:14:16 PM By: [name], BSN, RN, CWS, Kim RN, BSN Entered By: [name], BSN, RN, CWS, Kim on 04/08/2019 13:29:01

## 2019-04-08 NOTE — Progress Notes (Signed)
JIM, PHILEMON ([id number]) Visit Report for 04/08/2019 Allergy List Details Patient Name: [name], [name] Date of Service: 04/08/2019 1:15 PM Medical Record Number: [id number] Patient Account Number: [id number] Date of Birth/Sex: 1966/07/01 (53 y.o. F) Treating RN: [name] Primary Care [name]: [place], [place]IE Other Clinician: Referring [name]: [place], [place] Treating [name]/Extender: [name], [name] Weeks in Treatment: 0 Allergies Active Allergies Shellfish Containing Products Allergy Notes Electronic Signature(s) Signed: 04/08/2019 5:14:16 PM By: [name], BSN, RN, CWS, Kim RN, BSN Entered By: [name], BSN, RN, CWS, [name] on 04/08/2019 13:22:00 [name] ([id number]) -------------------------------------------------------------------------------- Arrival Information Details Patient Name: [name], [name] Date of Service: 04/08/2019 1:15 PM Medical Record Number: [id number] Patient Account Number: [id number] Date of Birth/Sex: 19-Nov-1965 (53 y.o. F) Treating RN: [name] Primary Care [name]: [place], JULIE Other Clinician: Referring Damaree Sargent: [place], [place] Treating [name]/Extender: [name], [name] Weeks in Treatment: 0 Visit Information Patient Arrived: Wheel Chair Arrival Time: 13:18 Accompanied By: friend, [name] Transfer Assistance: Manual Patient Identification Verified: Yes Secondary Verification Process Completed: Yes Patient Requires Transmission-Based No Precautions: Patient Has Alerts: No History Since Last Visit Electronic Signature(s) Signed: 04/08/2019 5:14:16 PM By: [name], BSN, RN, CWS, Kim RN, BSN Entered By: [name], BSN, RN, CWS, [name] on 04/08/2019 13:19:31 Bran, [name] ([id number]) -------------------------------------------------------------------------------- Clinic Level of Care Assessment Details Patient Name: [name], [name] Date of Service: 04/08/2019 1:15 PM Medical Record Number: [id number] Patient Account Number:  [id number] Date of Birth/Sex: 05/03/1966 (53 y.o. F) Treating RN: [name] Primary Care [name]: [place], [place]ULIE Other Clinician: Referring [name]: [place], [place] Treating [name]/Extender: [name] Weeks in Treatment: 0 Clinic Level of Care Assessment Items TOOL 2 Quantity Score []  - Use when only an EandM is performed on the INITIAL visit 0 ASSESSMENTS - Nursing Assessment / Reassessment X - General Physical Exam (combine w/ comprehensive assessment (listed just below) when 1 20 performed on new pt. evals) X- 1 25 Comprehensive Assessment (HX, ROS, Risk Assessments, Wounds Hx, etc.) ASSESSMENTS - Wound and Skin Assessment / Reassessment []  - Simple Wound Assessment / Reassessment - one wound 0 X- 3 5 Complex Wound Assessment / Reassessment - multiple wounds []  - 0 Dermatologic / Skin Assessment (not related to wound area) ASSESSMENTS - Ostomy and/or Continence Assessment and Care []  - Incontinence Assessment and Management 0 []  - 0 Ostomy Care Assessment and Management (repouching, etc.) PROCESS - Coordination of Care X - Simple Patient / Family Education for ongoing care 1 15 []  - 0 Complex (extensive) Patient / Family Education for ongoing care X- 1 10 Staff obtains , Records, Test Results / Process Orders []  - 0 Staff telephones HHA, Nursing Homes / Clarify orders / etc []  - 0 Routine Transfer to another Facility (non-emergent condition) []  - 0 Routine Hospital Admission (non-emergent condition) X- 1 15 New Admissions / / Ordering NPWT, Apligraf, etc. []  - 0 Emergency Hospital Admission (emergent condition) X- 1 10 Simple Discharge Coordination []  - 0 Complex (extensive) Discharge Coordination PROCESS - Special Needs []  - Pediatric / Minor Patient Management 0 []  - 0 Isolation Patient Management [name], [name] ( ) []  - 0 Hearing / Language / Visual special needs []  - 0 Assessment of Community  assistance (transportation, D/C planning, etc.) []  - 0 Additional assistance / Altered mentation []  - 0 Support Surface(s) Assessment (bed, cushion, seat, etc.) INTERVENTIONS - Wound Cleansing / Measurement X - Wound Imaging (photographs - any number of wounds) 1 5 []  - 0 Wound Tracing (instead of photographs) []  -  0 Simple Wound Measurement - one wound X- 3 5 Complex Wound Measurement - multiple wounds []  - 0 Simple Wound Cleansing - one wound X- 3 5 Complex Wound Cleansing - multiple wounds INTERVENTIONS - Wound Dressings X - Small Wound Dressing one or multiple wounds 3 10 []  - 0 Medium Wound Dressing one or multiple wounds []  - 0 Large Wound Dressing one or multiple wounds []  - 0 Application of Medications - injection INTERVENTIONS - Miscellaneous []  - External ear exam 0 []  - 0 Specimen Collection (cultures, biopsies, blood, body fluids, etc.) []  - 0 Specimen(s) / Culture(s) sent or taken to Lab for analysis []  - 0 Patient Transfer (multiple staff / Civil Service fast streamer / Similar devices) []  - 0 Simple Staple / Suture removal (25 or less) []  - 0 Complex Staple / Suture removal (26 or more) []  - 0 Hypo / Hyperglycemic Management (close monitor of Blood Glucose) []  - 0 Ankle / Brachial Index (ABI) - do not check if billed separately Has the patient been seen at the hospital within the last three years: Yes Total Score: 175 Level Of Care: New/Established - Level 5 Electronic Signature(s) Signed: 04/08/2019 4:54:06 PM By: [name] Entered By: [name] on 04/08/2019 13:57:06 [name], [name] ([id number]) -------------------------------------------------------------------------------- Encounter Discharge Information Details Patient Name: [name], [name] Date of Service: 04/08/2019 1:15 PM Medical Record Number: [id number] Patient Account Number: [id number] Date of Birth/Sex: [birth year] (53 y.o. F) Treating RN: [name] Primary Care [name]: [name] [place], JULIE  Other Clinician: Referring Ellianne Gowen: [name] [place], [place]ULIE Treating Darly Fails/Extender: Melburn Hake, [name] Weeks in Treatment: 0 Encounter Discharge Information Items Discharge Condition: Stable Ambulatory Status: Wheelchair Discharge Destination: Home Transportation: Private Auto Accompanied By: spouse Schedule Follow-up Appointment: Yes Clinical Summary of Care: Electronic Signature(s) Signed: 04/08/2019 2:25:23 PM By: [name] Entered By: [name] on 04/08/2019 14:25:23 Sok, [name] ([id number]) -------------------------------------------------------------------------------- Lower Extremity Assessment Details Patient Name: [name], [name] Date of Service: 04/08/2019 1:15 PM Medical Record Number: [id number] Patient Account Number: [id number] Date of Birth/Sex: June 14, 1966 (53 y.o. F) Treating RN: Cornell Barman Primary Care Shandria Clinch: [name] [place], JULIE Other Clinician: Referring Tayelor Osborne: [name] [place], [place]ULIE Treating Jonita Hirota/Extender: Melburn Hake, [name] Weeks in Treatment: 0 Electronic Signature(s) Signed: 04/08/2019 5:14:16 PM By: Gretta Cool, BSN, RN, CWS, Kim RN, BSN Entered By: Gretta Cool, BSN, RN, CWS, [name] on 04/08/2019 13:39:51 Haggart, [name] ([id number]) -------------------------------------------------------------------------------- Multi Wound Chart Details Patient Name: [name], [name] Date of Service: 04/08/2019 1:15 PM Medical Record Number: [id number] Patient Account Number: [id number] Date of Birth/Sex: December 21, 1965 (53 y.o. F) Treating RN: [name] Primary Care Yandel Zeiner: [name] [place], JULIE Other Clinician: Referring Kordelia Severin: [name] [place], [place]ULIE Treating Selina Tapper/Extender: [name] Weeks in Treatment: 0 Vital Signs Height(in): 63 Pulse(bpm): 21 Weight(lbs): 70 Blood Pressure(mmHg): 118/57 Body Mass Index(BMI): 12 Temperature(F): 99.1 Respiratory Rate 16 (breaths/min): Photos: Wound Location: Right Gluteus Left Gluteus Right Trochanter Wounding Event: Gradually  Appeared Pressure Injury Gradually Appeared Primary Etiology: Pressure Ulcer Pressure Ulcer Pressure Ulcer Comorbid History: Asthma, Chronic Obstructive Asthma, Chronic Obstructive Asthma, Chronic Obstructive Pulmonary Disease (COPD), Pulmonary Disease (COPD), Pulmonary Disease (COPD), History of pressure wounds, History of pressure wounds, History of pressure wounds, Paraplegia Paraplegia Paraplegia Date Acquired: 10/16/2018 10/16/2018 04/02/2018 Weeks of Treatment: 0 0 0 Wound Status: Open Open Open Clustered Wound: Yes No No Clustered Quantity: 3 N/A N/A Measurements L x W x D 10x6x0.7 0.5x0.3x0.1 5x4x0.9 (cm) Area (cm) : 47.124 0.118 15.708 Volume (cm) : 32.987 0.012 14.137 % Reduction in Area: 0.00% 0.00% 0.00% % Reduction in Volume: 0.00% 0.00%  0.00% Position 1 (o'clock): 12 Maximum Distance 1 (cm): 1.4 Starting Position 1 12 (o'clock): Ending Position 1 2 (o'clock): Maximum Distance 1 (cm): 1.7 Tunneling: Yes No No Undermining: N/A No Yes Classification: Category/Stage III Category/Stage III Category/Stage III Exudate Amount: Large Medium Medium PAITYNN, MIKUS. ([id number]) Exudate Type: Serous Serous Serous Exudate Color: amber amber amber Wound Margin: Flat and Intact Flat and Intact Flat and Intact Granulation Amount: Large (67-100%) None Present (0%) Large (67-100%) Granulation Quality: Red N/A Red Necrotic Amount: Small (1-33%) Large (67-100%) None Present (0%) Necrotic Tissue: Adherent Slough Eschar N/A Exposed Structures: Fat Layer (Subcutaneous Fat Layer (Subcutaneous Fat Layer (Subcutaneous Tissue) Exposed: Yes Tissue) Exposed: Yes Tissue) Exposed: Yes Fascia: No Fascia: No Fascia: No Tendon: No Tendon: No Tendon: No Muscle: No Muscle: No Muscle: No Joint: No Joint: No Joint: No Bone: No Bone: No Bone: No Epithelialization: Small (1-33%) None Small (1-33%) Treatment Notes Electronic Signature(s) Signed: 04/08/2019 4:54:06 PM By: [name] Entered By: [name] on 04/08/2019 13:52:14 Ruhe, [name] ([id number]) -------------------------------------------------------------------------------- Multi-Disciplinary Care Plan Details Patient Name: [name], [name] Date of Service: 04/08/2019 1:15 PM Medical Record Number: [id number] Patient Account Number: [id number] Date of Birth/Sex: [birth year] (53 y.o. F) Treating RN: [name] Primary Care Neco Kling: [place], [place]IE Other Clinician: Referring Erika Hussar: [place], [place] Treating Wilmon Conover/Extender: [name] Weeks in Treatment: 0 Active Inactive Abuse / Safety / Falls / Self Care Management Nursing Diagnoses: Impaired physical mobility Goals: Patient will not develop complications from immobility Date Initiated: 04/08/2019 Target Resolution Date: 07/12/2019 Goal Status: Active Interventions: Assess fall risk on admission and as needed Notes: Nutrition Nursing Diagnoses: Potential for alteratiion in Nutrition/Potential for imbalanced nutrition Goals: Patient/caregiver agrees to and verbalizes understanding of need to use nutritional supplements and/or vitamins as prescribed Date Initiated: 04/08/2019 Target Resolution Date: 07/12/2019 Goal Status: Active Interventions: Assess patient nutrition upon admission and as needed per policy Notes: Orientation to the Wound Care Program Nursing Diagnoses: Knowledge deficit related to the wound healing center program Goals: Patient/caregiver will verbalize understanding of the Wound Healing Center Program Date Initiated: 04/08/2019 Target Resolution Date: 07/12/2019 Goal Status: Active Interventions: Provide education on orientation to the wound center [name], [name] ([id number]) Notes: Pressure Nursing Diagnoses: Knowledge deficit related to management of pressures ulcers Goals: Patient will remain free of pressure ulcers Date Initiated: 04/08/2019 Target Resolution Date: 07/12/2019 Goal Status:  Active Interventions: Assess potential for pressure ulcer upon admission and as needed Notes: Wound/Skin Impairment Nursing Diagnoses: Impaired tissue integrity Goals: Ulcer/skin breakdown will heal within 14 weeks Date Initiated: 04/08/2019 Target Resolution Date: 07/12/2019 Goal Status: Active Interventions: Assess patient/caregiver ability to obtain necessary supplies Assess patient/caregiver ability to perform ulcer/skin care regimen upon admission and as needed Assess ulceration(s) every visit Notes: Electronic Signature(s) Signed: 04/08/2019 4:54:06 PM By: [name] Entered By: [name] on 04/08/2019 13:50:44 Fanara, Nysa M. ([id number]) -------------------------------------------------------------------------------- Pain Assessment Details Patient Name: [name] Date of Service: 04/08/2019 1:15 PM Medical Record Number: [id number] Patient Account Number: [id number] Date of Birth/Sex: Jul 31, 1965 (53 y.o. F) Treating RN: [name] Primary Care Marquavius Scaife: [place], [place]ULIE Other Clinician: Referring Hildreth Orsak: [place], [place] Treating Ansar Skoda/Extender: [name] Weeks in Treatment: 0 Active Problems Location of Pain Severity and Description of Pain Patient Has Paino No Site Locations Pain Management and Medication Current Pain Management: Notes Patient states No pain, no more than usual. Electronic Signature(s) Signed: 04/08/2019 5:14:16 PM By: [name], BSN, RN, CWS, Kim RN, BSN Entered By: [name], BSN, RN, CWS, [name] on  04/08/2019 13:20:04 [name], [name] ([id number]) -------------------------------------------------------------------------------- Patient/Caregiver Education Details Patient Name: [name], [name] Date of Service: 04/08/2019 1:15 PM Medical Record Number: [id number] Patient Account Number: [id number] Date of Birth/Gender: [birth year] (53 y.o. F) Treating RN: [name] Sitesorthy, Joanna Primary Care Physician: [place], [place] Other  Clinician: Referring Physician: [place], [place] Treating Physician/Extender: [name] DibblesSTONE III, [name] Weeks in Treatment: 0 Education Assessment Education Provided To: Patient and Caregiver Education Topics Provided Wound/Skin Impairment: Handouts: Other: wound care as ordered Methods: Demonstration, Explain/Verbal Responses: State content correctly Electronic Signature(s) Signed: 04/08/2019 4:54:06 PM By: [name] Sitesorthy, Joanna Entered By: [name] Sitesorthy, Joanna on 04/08/2019 14:24:32 Furuta, Katianna M. ([id number]) -------------------------------------------------------------------------------- Wound Assessment Details Patient Name: [name], Brinsley M. Date of Service: 04/08/2019 1:15 PM Medical Record Number: [id number] Patient Account Number: [id number] Date of Birth/Sex: [birth year] (53 y.o. F) Treating RN: [name] CoventryWoody, [name] Primary Care Satish Hammers: [place], [place]ULIE Other Clinician: Referring Shellsea Borunda: [place], [place]ULIE Treating Eidan Muellner/Extender: [name] Weeks in Treatment: 0 Wound Status Wound Number: 5 Primary Pressure Ulcer Etiology: Wound Location: Right Gluteus Wound Open Wounding Event: Gradually Appeared Status: Date Acquired: 10/16/2018 Comorbid Asthma, Chronic Obstructive Pulmonary Weeks Of Treatment: 0 History: Disease (COPD), History of pressure wounds, Clustered Wound: Yes Paraplegia Photos Wound Measurements Length: (cm) 10 % Reduction in Width: (cm) 6 % Reduction in Depth: (cm) 0.7 Epithelializat Clustered Quantity: 3 Tunneling: Area: (cm) 47.124 Position ( Volume: (cm) 32.987 Maximum Di Area: 0% Volume: 0% ion: Small (1-33%) Yes o'clock): 12 stance: (cm) 1.4 Wound Description Classification: Category/Stage III Foul Odor Afte Wound Margin: Flat and Intact Slough/Fibrino Exudate Amount: Large Exudate Type: Serous Exudate Color: amber r Cleansing: No Yes Wound Bed Granulation Amount: Large (67-100%) Exposed Structure Granulation Quality: Red Fascia Exposed: No Necrotic  Amount: Small (1-33%) Fat Layer (Subcutaneous Tissue) Exposed: Yes Necrotic Quality: Adherent Slough Tendon Exposed: No Muscle Exposed: No Joint Exposed: No Bone Exposed: No [name], Mozetta M. ([id number]) Treatment Notes Wound #5 (Right Gluteus) Notes hydrofera blue, BFD Electronic Signature(s) Signed: 04/08/2019 5:14:16 PM By: [name] GurneyWoody, BSN, RN, CWS, Kim RN, BSN Entered By: [name] GurneyWoody, BSN, RN, CWS, [name] on 04/08/2019 13:32:35 Ratterree, [name] ([id number]) -------------------------------------------------------------------------------- Wound Assessment Details Patient Name: [name], [name] Date of Service: 04/08/2019 1:15 PM Medical Record Number: [id number] Patient Account Number: [id number] Date of Birth/Sex: [birth year] (53 y.o. F) Treating RN: [name] CoventryWoody, [name] Primary Care Trayton Szabo: [place], [place]ULIE Other Clinician: Referring Elah Avellino: [place], [place] Treating Keelin Neville/Extender: [name] Weeks in Treatment: 0 Wound Status Wound Number: 6 Primary Pressure Ulcer Etiology: Wound Location: Left Gluteus Wound Open Wounding Event: Pressure Injury Status: Date Acquired: 10/16/2018 Comorbid Asthma, Chronic Obstructive Pulmonary Weeks Of Treatment: 0 History: Disease (COPD), History of pressure wounds, Clustered Wound: No Paraplegia Photos Wound Measurements Length: (cm) 0.5 % Reduction in Width: (cm) 0.3 % Reduction in Depth: (cm) 0.1 Epithelializat Area: (cm) 0.118 Tunneling: Volume: (cm) 0.012 Undermining: Area: 0% Volume: 0% ion: None No No Wound Description Classification: Category/Stage III Foul Odor Afte Wound Margin: Flat and Intact Slough/Fibrino Exudate Amount: Medium Exudate Type: Serous Exudate Color: amber r Cleansing: No No Wound Bed Granulation Amount: None Present (0%) Exposed Structure Necrotic Amount: Large (67-100%) Fascia Exposed: No Necrotic Quality: Eschar Fat Layer (Subcutaneous Tissue) Exposed: Yes Tendon Exposed: No Muscle Exposed:  No Joint Exposed: No Bone Exposed: No Treatment Notes Arechiga, Abbygael M. ([id number]) Wound #6 (Left Gluteus) Notes hydrofera blue, BFD Electronic Signature(s) Signed: 04/08/2019 4:54:06 PM By: [name] Sitesorthy, Joanna Signed: 04/08/2019 5:14:16 PM By: [name] GurneyWoody, BSN, RN, CWS, Kim RN, BSN Entered By: [name] Sitesorthy, Joanna on 04/08/2019 13:51:34 Moritz,  Kayliana M. ([id number]) -------------------------------------------------------------------------------- Wound Assessment Details Patient Name: EILA, RUNYAN. Date of Service: 04/08/2019 1:15 PM Medical Record Number: [id number] Patient Account Number: [id number] Date of Birth/Sex: 17-Dec-1965 (53 y.o. F) Treating RN: [name] Primary Care Milea Klink: [place], [place]ULIE Other Clinician: Referring Beck Cofer: [place], [place] Treating Yaslin Kirtley/Extender: [name] Weeks in Treatment: 0 Wound Status Wound Number: 7 Primary Pressure Ulcer Etiology: Wound Location: Right Trochanter Wound Open Wounding Event: Gradually Appeared Status: Date Acquired: 04/02/2018 Comorbid Asthma, Chronic Obstructive Pulmonary Weeks Of Treatment: 0 History: Disease (COPD), History of pressure wounds, Clustered Wound: No Paraplegia Photos Wound Measurements Length: (cm) 5 % Reduction in Width: (cm) 4 % Reduction in Depth: (cm) 0.9 Epithelializati Area: (cm) 15.708 Tunneling: Volume: (cm) 14.137 Undermining: Starting Pos Ending Posit Maximum Dist Area: 0% Volume: 0% on: Small (1-33%) No Yes ition (o'clock): 12 ion (o'clock): 2 ance: (cm) 1.7 Wound Description Classification: Category/Stage III Foul Odor After Wound Margin: Flat and Intact Slough/Fibrino Exudate Amount: Medium Exudate Type: Serous Exudate Color: amber Cleansing: No No Wound Bed Granulation Amount: Large (67-100%) Exposed Structure Granulation Quality: Red Fascia Exposed: No Necrotic Amount: None Present (0%) Fat Layer (Subcutaneous Tissue) Exposed: Yes Tendon Exposed: No Muscle Exposed:  No Joint Exposed: No [place], Shayley M. ([id number]) Bone Exposed: No Treatment Notes Wound #7 (Right Trochanter) Notes hydrofera blue, BFD Electronic Signature(s) Signed: 04/08/2019 5:14:16 PM By: [name], BSN, RN, CWS, Kim RN, BSN Entered By: [name], BSN, RN, CWS, [name] on 04/08/2019 13:39:29 Fallin, [name] ([id number]) -------------------------------------------------------------------------------- Vitals Details Patient Name: [name] Date of Service: 04/08/2019 1:15 PM Medical Record Number: [id number] Patient Account Number: [id number] Date of Birth/Sex: Jul 09, 1965 (53 y.o. F) Treating RN: [name] Primary Care Enrica Corliss: [place], [place]IE Other Clinician: Referring Taziyah Iannuzzi: [place], [place] Treating Angelena Sand/Extender: [name] Weeks in Treatment: 0 Vital Signs Time Taken: 13:20 Temperature (F): 99.1 Height (in): 63 Pulse (bpm): 58 Weight (lbs): 70 Respiratory Rate (breaths/min): 16 Body Mass Index (BMI): 12.4 Blood Pressure (mmHg): 118/57 Reference Range: 80 - 120 mg / dl Electronic Signature(s) Signed: 04/08/2019 5:14:16 PM By: [name], BSN, RN, CWS, Kim RN, BSN Entered By: [name], BSN, RN, CWS, [name] on 04/08/2019 13:21:07

## 2019-04-09 NOTE — Progress Notes (Signed)
LOVIE, ZARLING ([id number]) Visit Report for 04/08/2019 Chief Complaint Document Details Patient Name: [name], [name] Date of Service: 04/08/2019 1:15 PM Medical Record Number: [id number] Patient Account Number: [id number] Date of Birth/Sex: [birth year] (53 y.o. F) Treating RN: Curtis Sites Primary Care Provider: [place], JULIE Other Clinician: Referring Provider: [place], JULIE Treating Provider/Extender: STONE III, HOYT Weeks in Treatment: 0 Information Obtained from: Patient Chief Complaint Bilateral gluteal and right hip pressure ulcers Electronic Signature(s) Signed: 04/08/2019 1:46:29 PM By: Lenda Kelp PA-C Entered By: Lenda Kelp on 04/08/2019 13:46:29 Grosshans, [name] ([id number]) -------------------------------------------------------------------------------- HPI Details Patient Name: [name], [name] Date of Service: 04/08/2019 1:15 PM Medical Record Number: [id number] Patient Account Number: [id number] Date of Birth/Sex: Jun 22, 1966 (53 y.o. F) Treating RN: Curtis Sites Primary Care Provider: [place], JULIE Other Clinician: Referring Provider: [place], JULIE Treating Provider/Extender: STONE III, HOYT Weeks in Treatment: 0 History of Present Illness Location: right scapular region Quality: Patient reports No Pain. Severity: Patient states wound (s) are getting better. Duration: Patient has had the wound for > 2 weeks prior to seeking treatment at the wound center Context: The wound would happen gradually Modifying Factors: Other treatment(s) tried include:been in hospital sick with a pneumonia and has had problems with transportation Associated Signs and Symptoms: Patient reports having:healed the wound on her sacral region HPI Description: 53 year old patient who was been an unreliable historian says she had completely healed in February and most recently her home health noticed that the wound on the right scapular area had reopened. She was in hospital  about a month ago with pneumonia at that time a workup was done. We have no notes today but will workup the electronic medical record. on review of her records electronically I do not find any recent hospital MRI in the last 6 months. She had a CT scan of the chest in February 2018 which does not show any osseous lesions. she was admitted to the hospital between May 14 and May 17 for 3 days for mucus plugging and collapse of the left lung. at that time she had a decubitus ulcer of the right scapular region stage III. her history was noted to have quadriplegia C5-C7 incomplete, neurogenic bladder, nephrostomy catheter and emphysema. patient tells me that she has not smoked for last month and a half but as per her hospital records she was smoking in the middle of May. 01/12/17 patient scapular region appears to be doing fairly well on evaluation today. She is having really no syndicate discomfort and has been tolerating the dressing changes well complication. Readmission: 04/08/2019 patient presents today for reevaluation in our clinic concerning issues she is having with wounds on the bilateral gluteal area as well as on the right trochanter. She has unfortunately noted that things seem to have gotten worse as she was using the [place] bed. The patient states it gives off a lot of heat and subsequently has not been good for her skin causing her to breakdown more. She has been trying to get back just to the air mattress but has not been able to get the company to come pick up that bed and bring out the air mattress. Her primary care provider is on this as well trying to get them to do so. Unfortunately her wounds are significantly large and show signs of hyper granulation as well. She has had this kind of issue for quite some time and unfortunately just does not seem to be showing signs of significant improvement with what she  has been using currently. No fevers, chills, nausea, vomiting, or  diarrhea. She does attempt appropriate offloading and again has been using the bed because that is all she has although she is not happy with it. 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 For about 4-5 months she's had a decubitus ulcer on the right scapular region. she was lost to follow-up for the last 6 months. she had a couple of small areas which have opened out in the region of previous scar tissue in the sacral region but these haveall healed now. She does not have any other significant comorbidities. She has a good Roho cushion for her wheelchair and she has a specialized mattress for sleep. She has restarted smoking and smokes about half a pack of cigarettes a day 02/18/2015 -- X-ray of the pelvis -- IMPRESSION:No acute osseous injury of the pelvis. If there is clinical concern regarding osteomyelitis, recommend further evaluation with CT. X-ray of the sacrum and coccyx IMPRESSION:No acute osseous abnormality of the sacrum. If there is further clinical concern regarding osteomyelitis of the sacrum, further evaluation with CT is recommended. ========= [name], [name] ([id number]) Electronic Signature(s) Signed: 04/08/2019 1:58:29 PM By: Lenda Kelp PA-C Entered By: Lenda Kelp on 04/08/2019 13:58:29 Quam, [name] ([id number]) -------------------------------------------------------------------------------- Physical Exam Details Patient Name: [name], [name] Date of Service: 04/08/2019 1:15 PM Medical Record Number: [id number] Patient Account Number: [id number] Date of Birth/Sex: 1966-03-26 (53 y.o. F) Treating RN: Curtis Sites Primary Care Provider: [place], JULIE Other Clinician: Referring Provider: [place], JULIE Treating Provider/Extender: STONE III, HOYT Weeks in Treatment: 0 Constitutional sitting or standing blood pressure is within  target range for patient.. pulse regular and within target range for patient.Marland Kitchen respirations regular, non-labored and within target range for patient.Marland Kitchen temperature within target range for patient.. Well- nourished and well-hydrated in no acute distress. Eyes conjunctiva clear no eyelid edema noted. pupils equal round and reactive to light and accommodation. Ears, Nose, Mouth, and Throat no gross abnormality of ear auricles or external auditory canals. normal hearing noted during conversation. mucus membranes moist. Respiratory normal breathing without difficulty. clear to auscultation bilaterally. Cardiovascular regular rate and rhythm with normal S1, S2. no clubbing, cyanosis, significant edema, <3 sec cap refill. Gastrointestinal (GI) soft, non-tender, non-distended, +BS. no ventral hernia noted. Musculoskeletal Patient unable to walk. no significant deformity or arthritic changes, no loss or range of motion, no clubbing. Psychiatric this patient is able to make decisions and demonstrates good insight into disease process. Alert and Oriented x 3. pleasant and cooperative. Notes Patient's wound bed currently showed signs of fairly good granulation although it was hyper granular at all wound locations. Fortunately there is no signs of active infection at this time. No fevers, chills, nausea, vomiting, or diarrhea. She is very thin and tells me that she does drink 3 ensures a day in order to try to help with weight gain and her appetite has improved since she quit smoking in September of this year she tells me. This is excellent news and I did congratulate her on that. Electronic Signature(s) Signed: 04/08/2019 1:59:21 PM By: Lenda Kelp PA-C Entered By: Lenda Kelp on 04/08/2019 13:59:21 Lard, [name] ([id number]) -------------------------------------------------------------------------------- Physician Orders Details Patient Name: [name], [name] Date of Service:  04/08/2019 1:15 PM Medical Record Number: [id number] Patient Account Number: [id number] Date of Birth/Sex: 1965-11-09 (53 y.o. F) Treating RN: Francesco Sor,  Mardene Celeste Primary Care Provider: [place], JULIE Other Clinician: Referring Provider: [place], JULIE Treating Provider/Extender: Linwood Dibbles, HOYT Weeks in Treatment: 0 Verbal / Phone Orders: No Diagnosis Coding ICD-10 Coding Code Description L89.313 Pressure ulcer of right buttock, stage 3 L89.322 Pressure ulcer of left buttock, stage 2 L89.213 Pressure ulcer of right hip, stage 3 G82.52 Quadriplegia, C1-C4 incomplete E44.0 Moderate protein-calorie malnutrition Wound Cleansing Wound #5 Right Gluteus o Clean wound with Normal Saline. o May Shower, gently pat wound dry prior to applying new dressing. Wound #6 Left Gluteus o Clean wound with Normal Saline. o May Shower, gently pat wound dry prior to applying new dressing. Wound #7 Right Trochanter o Clean wound with Normal Saline. o May Shower, gently pat wound dry prior to applying new dressing. Primary Wound Dressing Wound #5 Right Gluteus o Hydrafera Blue Ready Transfer Wound #6 Left Gluteus o Hydrafera Blue Ready Transfer Wound #7 Right Trochanter o Hydrafera Blue Ready Transfer Secondary Dressing Wound #5 Right Gluteus o Boardered Foam Dressing Wound #6 Left Gluteus o Boardered Foam Dressing Wound #7 Right Trochanter o Boardered Foam Dressing Dressing Change Frequency EMILINE, MANCEBO. ([id number]) Wound #5 Right Gluteus o Change Dressing Monday, Wednesday, Friday - or three times weekly Wound #6 Left Gluteus o Change Dressing Monday, Wednesday, Friday - or three times weekly Wound #7 Right Trochanter o Change Dressing Monday, Wednesday, Friday - or three times weekly Follow-up Appointments o Return Appointment in 2 weeks. Off-Loading Wound #5 Right Gluteus o Mattress - air mattress or fluidized air mattress o Turn and reposition every 2  hours Wound #6 Left Gluteus o Mattress - air mattress or fluidized air mattress o Turn and reposition every 2 hours Wound #7 Right Trochanter o Mattress - air mattress or fluidized air mattress o Turn and reposition every 2 hours Additional Orders / Instructions Wound #5 Right Gluteus o Increase protein intake. Wound #6 Left Gluteus o Increase protein intake. Wound #7 Right Trochanter o Increase protein intake. Home Health Wound #5 Right Gluteus o Continue Home Health Visits - [facility] - Please provide dressing materials for patient o Home Health Nurse may visit PRN to address patientos wound care needs. o FACE TO FACE ENCOUNTER: MEDICARE and MEDICAID PATIENTS: I certify that this patient is under my care and that I had a face-to-face encounter that meets the physician face-to-face encounter requirements with this patient on this date. The encounter with the patient was in whole or in part for the following MEDICAL CONDITION: (primary reason for Home Healthcare) MEDICAL NECESSITY: I certify, that based on my findings, NURSING services are a medically necessary home health service. HOME BOUND STATUS: I certify that my clinical findings support that this patient is homebound (i.e., Due to illness or injury, pt requires aid of supportive devices such as crutches, cane, wheelchairs, walkers, the use of special transportation or the assistance of another person to leave their place of residence. There is a normal inability to leave the home and doing so requires considerable and taxing effort. Other absences are for medical reasons / religious services and are infrequent or of short duration when for other reasons). o If current dressing causes regression in wound condition, may D/C ordered dressing product/s and apply Normal Saline Moist Dressing daily until next Wound Healing Center / Other MD appointment. Notify Wound Healing Center of regression in wound condition  at [phone]. o Please direct any NON-WOUND related issues/requests for orders to patient's Primary Care Physician Wound #6 Left Gluteus o Continue Home Health Visits -  [facility] - Please provide dressing materials for patient o Home Health Nurse may visit PRN to address patientos wound care needs. [name], [name] ([id number]) o FACE TO FACE ENCOUNTER: MEDICARE and MEDICAID PATIENTS: I certify that this patient is under my care and that I had a face-to-face encounter that meets the physician face-to-face encounter requirements with this patient on this date. The encounter with the patient was in whole or in part for the following MEDICAL CONDITION: (primary reason for Home Healthcare) MEDICAL NECESSITY: I certify, that based on my findings, NURSING services are a medically necessary home health service. HOME BOUND STATUS: I certify that my clinical findings support that this patient is homebound (i.e., Due to illness or injury, pt requires aid of supportive devices such as crutches, cane, wheelchairs, walkers, the use of special transportation or the assistance of another person to leave their place of residence. There is a normal inability to leave the home and doing so requires considerable and taxing effort. Other absences are for medical reasons / religious services and are infrequent or of short duration when for other reasons). o If current dressing causes regression in wound condition, may D/C ordered dressing product/s and apply Normal Saline Moist Dressing daily until next Wound Healing Center / Other MD appointment. Notify Wound Healing Center of regression in wound condition at [phone]. o Please direct any NON-WOUND related issues/requests for orders to patient's Primary Care Physician Wound #7 Right Trochanter o Continue Home Health Visits - [facility] - Please provide dressing materials for patient o Home Health Nurse may visit PRN to address patientos wound  care needs. o FACE TO FACE ENCOUNTER: MEDICARE and MEDICAID PATIENTS: I certify that this patient is under my care and that I had a face-to-face encounter that meets the physician face-to-face encounter requirements with this patient on this date. The encounter with the patient was in whole or in part for the following MEDICAL CONDITION: (primary reason for Home Healthcare) MEDICAL NECESSITY: I certify, that based on my findings, NURSING services are a medically necessary home health service. HOME BOUND STATUS: I certify that my clinical findings support that this patient is homebound (i.e., Due to illness or injury, pt requires aid of supportive devices such as crutches, cane, wheelchairs, walkers, the use of special transportation or the assistance of another person to leave their place of residence. There is a normal inability to leave the home and doing so requires considerable and taxing effort. Other absences are for medical reasons / religious services and are infrequent or of short duration when for other reasons). o If current dressing causes regression in wound condition, may D/C ordered dressing product/s and apply Normal Saline Moist Dressing daily until next Wound Healing Center / Other MD appointment. Notify Wound Healing Center of regression in wound condition at [phone]. o Please direct any NON-WOUND related issues/requests for orders to patient's Primary Care Physician Electronic Signature(s) Signed: 04/08/2019 4:54:06 PM By: Curtis Sites Signed: 04/08/2019 5:57:32 PM By: Lenda Kelp PA-C Entered By: Curtis Sites on 04/08/2019 13:56:09 Christopoulos, [name] ([id number]) -------------------------------------------------------------------------------- Problem List Details Patient Name: [name], [name] Date of Service: 04/08/2019 1:15 PM Medical Record Number: [id number] Patient Account Number: [id number] Date of Birth/Sex: [birth year] (53 y.o. F) Treating RN:  Curtis Sites Primary Care Provider: [place], JULIE Other Clinician: Referring Provider: [place], JULIE Treating Provider/Extender: Linwood Dibbles, HOYT Weeks in Treatment: 0 Active Problems ICD-10 Evaluated Encounter Code Description Active Date Today Diagnosis L89.313 Pressure ulcer of right buttock, stage 3 04/08/2019 No  Yes L89.323 Pressure ulcer of left buttock, stage 3 04/08/2019 No Yes L89.213 Pressure ulcer of right hip, stage 3 04/08/2019 No Yes G82.52 Quadriplegia, C1-C4 incomplete 04/08/2019 No Yes E44.0 Moderate protein-calorie malnutrition 04/08/2019 No Yes Inactive Problems Resolved Problems Electronic Signature(s) Signed: 04/08/2019 1:57:19 PM By: Lenda Kelp PA-C Previous Signature: 04/08/2019 1:45:57 PM Version By: Lenda Kelp PA-C Entered By: Lenda Kelp on 04/08/2019 13:57:18 Pelfrey, [name] ([id number]) -------------------------------------------------------------------------------- Progress Note Details Patient Name: [name] Date of Service: 04/08/2019 1:15 PM Medical Record Number: [id number] Patient Account Number: [id number] Date of Birth/Sex: 1965-10-17 (53 y.o. F) Treating RN: Curtis Sites Primary Care Provider: [place], JULIE Other Clinician: Referring Provider: [place], JULIE Treating Provider/Extender: STONE III, HOYT Weeks in Treatment: 0 Subjective Chief Complaint Information obtained from Patient Bilateral gluteal and right hip pressure ulcers History of Present Illness (HPI) The following HPI elements were documented for the patient's wound: Location: right scapular region Quality: Patient reports No Pain. Severity: Patient states wound (s) are getting better. Duration: Patient has had the wound for > 2 weeks prior to seeking treatment at the wound center Context: The wound would happen gradually Modifying Factors: Other treatment(s) tried include:been in hospital sick with a pneumonia and has had problems  with transportation Associated Signs and Symptoms: Patient reports having:healed the wound on her sacral region 53 year old patient who was been an unreliable historian says she had completely healed in February and most recently her home health noticed that the wound on the right scapular area had reopened. She was in hospital about a month ago with pneumonia at that time a workup was done. We have no notes today but will workup the electronic medical record. on review of her records electronically I do not find any recent hospital MRI in the last 6 months. She had a CT scan of the chest in February 2018 which does not show any osseous lesions. she was admitted to the hospital between May 14 and May 17 for 3 days for mucus plugging and collapse of the left lung. at that time she had a decubitus ulcer of the right scapular region stage III. her history was noted to have quadriplegia C5-C7 incomplete, neurogenic bladder, nephrostomy catheter and emphysema. patient tells me that she has not smoked for last month and a half but as per her hospital records she was smoking in the middle of May. 01/12/17 patient scapular region appears to be doing fairly well on evaluation today. She is having really no syndicate discomfort and has been tolerating the dressing changes well complication. Readmission: 04/08/2019 patient presents today for reevaluation in our clinic concerning issues she is having with wounds on the bilateral gluteal area as well as on the right trochanter. She has unfortunately noted that things seem to have gotten worse as she was using the [place] bed. The patient states it gives off a lot of heat and subsequently has not been good for her skin causing her to breakdown more. She has been trying to get back just to the air mattress but has not been able to get the company to come pick up that bed and bring out the air mattress. Her primary care provider is on this as well trying to get  them to do so. Unfortunately her wounds are significantly large and show signs of hyper granulation as well. She has had this kind of issue for quite some time and unfortunately just does not seem to be showing signs of significant improvement with  what she has been using currently. No fevers, chills, nausea, vomiting, or diarrhea. She does attempt appropriate offloading and again has been using the bed because that is all she has although she is not happy with it. 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. For about 4-5 months she's had a decubitus ulcer on the right scapular region. she was lost to follow-up for the last 6 months. she had a couple of small areas which have opened out in the region of previous scar tissue in Reynolds, Oneka M. ([id number]) the sacral region but these haveall healed now. She does not have any other significant comorbidities. She has a good Roho cushion for her wheelchair and she has a specialized mattress for sleep. She has restarted smoking and smokes about half a pack of cigarettes a day 02/18/2015 -- X-ray of the pelvis -- IMPRESSION:No acute osseous injury of the pelvis. If there is clinical concern regarding osteomyelitis, recommend further evaluation with CT. X-ray of the sacrum and coccyx IMPRESSION:No acute osseous abnormality of the sacrum. If there is further clinical concern regarding osteomyelitis of the sacrum, further evaluation with CT is recommended. ========= Patient History Information obtained from Patient. Allergies Shellfish Containing Products Family History Cancer - Mother, Diabetes - Siblings, Hypertension - Siblings, No family history of Heart Disease, Hereditary Spherocytosis, Kidney Disease, Lung Disease, Seizures, Stroke, Thyroid Problems, Tuberculosis. Social History Former smoker - ended on 03/09/2019,  Marital Status - Separated, Alcohol Use - Never, Drug Use - No History, Caffeine Use - Never. Medical History Eyes Denies history of Cataracts, Glaucoma, Optic Neuritis Ear/Nose/Mouth/Throat Denies history of Chronic sinus problems/congestion, Middle ear problems Hematologic/Lymphatic Denies history of Anemia, Hemophilia, Human Immunodeficiency Virus, Lymphedema, Sickle Cell Disease Respiratory Patient has history of Asthma, Chronic Obstructive Pulmonary Disease (COPD) Cardiovascular Denies history of Angina, Arrhythmia, Congestive Heart Failure, Coronary Artery Disease, Deep Vein Thrombosis, Hypertension, Hypotension, Myocardial Infarction, Peripheral Arterial Disease, Peripheral Venous Disease, Phlebitis, Vasculitis Gastrointestinal Denies history of Cirrhosis , Colitis, Crohn s, Hepatitis A, Hepatitis B, Hepatitis C Endocrine Denies history of Type I Diabetes, Type II Diabetes Genitourinary Denies history of End Stage Renal Disease Immunological Denies history of Lupus Erythematosus, Raynaud s, Scleroderma Integumentary (Skin) Patient has history of History of pressure wounds Denies history of History of Burn Musculoskeletal Denies history of Gout, Rheumatoid Arthritis, Osteoarthritis, Osteomyelitis Neurologic Patient has history of Paraplegia - C 3 paraplegia Oncologic Denies history of Received Chemotherapy, Received Radiation Hospitalization/Surgery History - collapsed lung and lung congestion. Medical And Surgical History Notes JOZETTE, CASTRELLON ([id number]) Genitourinary urostomy Musculoskeletal right arm contracture Review of Systems (ROS) Eyes Denies complaints or symptoms of Dry Eyes, Vision Changes, Glasses / Contacts. Ear/Nose/Mouth/Throat Denies complaints or symptoms of Difficult clearing ears, Sinusitis. Hematologic/Lymphatic Denies complaints or symptoms of Bleeding / Clotting Disorders, Human Immunodeficiency Virus. Cardiovascular Denies complaints or  symptoms of Chest pain, LE edema. Gastrointestinal Denies complaints or symptoms of Frequent diarrhea, Nausea, Vomiting. Endocrine Denies complaints or symptoms of Hepatitis, Thyroid disease, Polydypsia (Excessive Thirst). Genitourinary Denies complaints or symptoms of Kidney failure/ Dialysis, Incontinence/dribbling. Immunological Denies complaints or symptoms of Hives, Itching. Integumentary (Skin) Complains or has symptoms of Wounds. Musculoskeletal Denies complaints or symptoms of Muscle Pain, Muscle Weakness. Psychiatric Denies complaints or symptoms of Anxiety, Claustrophobia. Objective Constitutional sitting or standing blood pressure is within target range for patient.. pulse regular and within target range for patient.Marland Kitchen respirations regular, non-labored and  within target range for patient.Marland Kitchen temperature within target range for patient.. Well- nourished and well-hydrated in no acute distress. Vitals Time Taken: 1:20 PM, Height: 63 in, Weight: 70 lbs, BMI: 12.4, Temperature: 99.1 F, Pulse: 58 bpm, Respiratory Rate: 16 breaths/min, Blood Pressure: 118/57 mmHg. Eyes conjunctiva clear no eyelid edema noted. pupils equal round and reactive to light and accommodation. Ears, Nose, Mouth, and Throat no gross abnormality of ear auricles or external auditory canals. normal hearing noted during conversation. mucus membranes moist. Respiratory normal breathing without difficulty. clear to auscultation bilaterally. Cardiovascular [name], [name] ([id number]) regular rate and rhythm with normal S1, S2. no clubbing, cyanosis, significant edema, Gastrointestinal (GI) soft, non-tender, non-distended, +BS. no ventral hernia noted. Musculoskeletal Patient unable to walk. no significant deformity or arthritic changes, no loss or range of motion, no clubbing. Psychiatric this patient is able to make decisions and demonstrates good insight into disease process. Alert and Oriented x 3.  pleasant and cooperative. General Notes: Patient's wound bed currently showed signs of fairly good granulation although it was hyper granular at all wound locations. Fortunately there is no signs of active infection at this time. No fevers, chills, nausea, vomiting, or diarrhea. She is very thin and tells me that she does drink 3 ensures a day in order to try to help with weight gain and her appetite has improved since she quit smoking in September of this year she tells me. This is excellent news and I did congratulate her on that. Integumentary (Hair, Skin) Wound #5 status is Open. Original cause of wound was Gradually Appeared. The wound is located on the Right Gluteus. The wound measures 10cm length x 6cm width x 0.7cm depth; 47.124cm^2 area and 32.987cm^3 volume. There is Fat Layer (Subcutaneous Tissue) Exposed exposed. There is tunneling at 12:00 with a maximum distance of 1.4cm. There is a large amount of serous drainage noted. The wound margin is flat and intact. There is large (67-100%) red granulation within the wound bed. There is a small (1-33%) amount of necrotic tissue within the wound bed including Adherent Slough. Wound #6 status is Open. Original cause of wound was Pressure Injury. The wound is located on the Left Gluteus. The wound measures 0.5cm length x 0.3cm width x 0.1cm depth; 0.118cm^2 area and 0.012cm^3 volume. There is Fat Layer (Subcutaneous Tissue) Exposed exposed. There is no tunneling or undermining noted. There is a medium amount of serous drainage noted. The wound margin is flat and intact. There is no granulation within the wound bed. There is a large (67-100%) amount of necrotic tissue within the wound bed including Eschar. Wound #7 status is Open. Original cause of wound was Gradually Appeared. The wound is located on the Right Trochanter. The wound measures 5cm length x 4cm width x 0.9cm depth; 15.708cm^2 area and 14.137cm^3 volume. There is Fat  Layer (Subcutaneous Tissue) Exposed exposed. There is no tunneling noted, however, there is undermining starting at 12:00 and ending at 2:00 with a maximum distance of 1.7cm. There is a medium amount of serous drainage noted. The wound margin is flat and intact. There is large (67-100%) red granulation within the wound bed. There is no necrotic tissue within the wound bed. Assessment Active Problems ICD-10 Pressure ulcer of right buttock, stage 3 Pressure ulcer of left buttock, stage 3 Pressure ulcer of right hip, stage 3 Quadriplegia, C1-C4 incomplete Moderate protein-calorie malnutrition Plan [name], [name] ([id number]) Wound Cleansing: Wound #5 Right Gluteus: Clean wound with Normal Saline. May Shower, gently pat wound  dry prior to applying new dressing. Wound #6 Left Gluteus: Clean wound with Normal Saline. May Shower, gently pat wound dry prior to applying new dressing. Wound #7 Right Trochanter: Clean wound with Normal Saline. May Shower, gently pat wound dry prior to applying new dressing. Primary Wound Dressing: Wound #5 Right Gluteus: Hydrafera Blue Ready Transfer Wound #6 Left Gluteus: Hydrafera Blue Ready Transfer Wound #7 Right Trochanter: Hydrafera Blue Ready Transfer Secondary Dressing: Wound #5 Right Gluteus: Boardered Foam Dressing Wound #6 Left Gluteus: Boardered Foam Dressing Wound #7 Right Trochanter: Boardered Foam Dressing Dressing Change Frequency: Wound #5 Right Gluteus: Change Dressing Monday, Wednesday, Friday - or three times weekly Wound #6 Left Gluteus: Change Dressing Monday, Wednesday, Friday - or three times weekly Wound #7 Right Trochanter: Change Dressing Monday, Wednesday, Friday - or three times weekly Follow-up Appointments: Return Appointment in 2 weeks. Off-Loading: Wound #5 Right Gluteus: Mattress - air mattress or fluidized air mattress Turn and reposition every 2 hours Wound #6 Left Gluteus: Mattress - air mattress or  fluidized air mattress Turn and reposition every 2 hours Wound #7 Right Trochanter: Mattress - air mattress or fluidized air mattress Turn and reposition every 2 hours Additional Orders / Instructions: Wound #5 Right Gluteus: Increase protein intake. Wound #6 Left Gluteus: Increase protein intake. Wound #7 Right Trochanter: Increase protein intake. Home Health: Wound #5 Right Gluteus: Continue Home Health Visits - [facility] - Please provide dressing materials for patient Home Health Nurse may visit PRN to address patient s wound care needs. FACE TO FACE ENCOUNTER: MEDICARE and MEDICAID PATIENTS: I certify that this patient is under my care and that I had a face-to-face encounter that meets the physician face-to-face encounter requirements with this patient on this date. The encounter with the patient was in whole or in part for the following MEDICAL CONDITION: (primary reason for Home Healthcare) MEDICAL NECESSITY: I certify, that based on my findings, NURSING services are a medically necessary home health service. HOME BOUND STATUS: I certify that my clinical findings support that this patient is homebound (i.e., Due to [name], TATEM. ([id number]) illness or injury, pt requires aid of supportive devices such as crutches, cane, wheelchairs, walkers, the use of special transportation or the assistance of another person to leave their place of residence. There is a normal inability to leave the home and doing so requires considerable and taxing effort. Other absences are for medical reasons / religious services and are infrequent or of short duration when for other reasons). If current dressing causes regression in wound condition, may D/C ordered dressing product/s and apply Normal Saline Moist Dressing daily until next Wound Healing Center / Other MD appointment. Notify Wound Healing Center of regression in wound condition at [phone]. Please direct any NON-WOUND related  issues/requests for orders to patient's Primary Care Physician Wound #6 Left Gluteus: Continue Home Health Visits - [facility] - Please provide dressing materials for patient Home Health Nurse may visit PRN to address patient s wound care needs. FACE TO FACE ENCOUNTER: MEDICARE and MEDICAID PATIENTS: I certify that this patient is under my care and that I had a face-to-face encounter that meets the physician face-to-face encounter requirements with this patient on this date. The encounter with the patient was in whole or in part for the following MEDICAL CONDITION: (primary reason for Home Healthcare) MEDICAL NECESSITY: I certify, that based on my findings, NURSING services are a medically necessary home health service. HOME BOUND STATUS: I certify that my clinical findings support that this  patient is homebound (i.e., Due to illness or injury, pt requires aid of supportive devices such as crutches, cane, wheelchairs, walkers, the use of special transportation or the assistance of another person to leave their place of residence. There is a normal inability to leave the home and doing so requires considerable and taxing effort. Other absences are for medical reasons / religious services and are infrequent or of short duration when for other reasons). If current dressing causes regression in wound condition, may D/C ordered dressing product/s and apply Normal Saline Moist Dressing daily until next Wound Healing Center / Other MD appointment. Notify Wound Healing Center of regression in wound condition at [phone]. Please direct any NON-WOUND related issues/requests for orders to patient's Primary Care Physician Wound #7 Right Trochanter: Continue Home Health Visits - [facility] - Please provide dressing materials for patient Home Health Nurse may visit PRN to address patient s wound care needs. FACE TO FACE ENCOUNTER: MEDICARE and MEDICAID PATIENTS: I certify that this patient is under my care and  that I had a face-to-face encounter that meets the physician face-to-face encounter requirements with this patient on this date. The encounter with the patient was in whole or in part for the following MEDICAL CONDITION: (primary reason for Home Healthcare) MEDICAL NECESSITY: I certify, that based on my findings, NURSING services are a medically necessary home health service. HOME BOUND STATUS: I certify that my clinical findings support that this patient is homebound (i.e., Due to illness or injury, pt requires aid of supportive devices such as crutches, cane, wheelchairs, walkers, the use of special transportation or the assistance of another person to leave their place of residence. There is a normal inability to leave the home and doing so requires considerable and taxing effort. Other absences are for medical reasons / religious services and are infrequent or of short duration when for other reasons). If current dressing causes regression in wound condition, may D/C ordered dressing product/s and apply Normal Saline Moist Dressing daily until next Wound Healing Center / Other MD appointment. Notify Wound Healing Center of regression in wound condition at [phone]. Please direct any NON-WOUND related issues/requests for orders to patient's Primary Care Physician 1. I am going to go ahead and recommend that we initiate a Hydrofera Blue dressing to all wound locations today as I feel like this will be most appropriate for her based on the hyper granular nature of the wounds. They are also causing her some discomfort. 2. I am also going to suggest that we continue with a border foam dressing over top of this to add extra padding and hopefully help with some of the discomfort as well. 3. I am recommending appropriate offloading changing positions every 2 hours her boyfriend, rate, helps her with this and takes care of her very well. 4. With regard to offloading the air-fluidized mattress is  not doing well for her I am recommending an air mattress at this time which I think will be much better based on what she is telling me. The company needs to bring this down and they are already aware of this that they need to take the air-fluidized mattress away but again they have not come out to do that as of yet she is becoming somewhat frustrated. 5. We will continue with home health for her as well and send orders along to them today. We will see patient back for reevaluation in 1 week here in the clinic. If anything worsens or changes patient will contact  our office for additional recommendations. [name], DEGRAAF ([id number]) Electronic Signature(s) Signed: 04/08/2019 2:00:32 PM By: Lenda Kelp PA-C Entered By: Lenda Kelp on 04/08/2019 14:00:32 Netzer, [name] ([id number]) -------------------------------------------------------------------------------- ROS/PFSH Details Patient Name: ISRAELLA, HUBERT. Date of Service: 04/08/2019 1:15 PM Medical Record Number: [id number] Patient Account Number: [id number] Date of Birth/Sex: 02/25/1966 (53 y.o. F) Treating RN: Huel Coventry Primary Care Provider: [place], JULIE Other Clinician: Referring Provider: [place], JULIE Treating Provider/Extender: STONE III, HOYT Weeks in Treatment: 0 Information Obtained From Patient Eyes Complaints and Symptoms: Negative for: Dry Eyes; Vision Changes; Glasses / Contacts Medical History: Negative for: Cataracts; Glaucoma; Optic Neuritis Ear/Nose/Mouth/Throat Complaints and Symptoms: Negative for: Difficult clearing ears; Sinusitis Medical History: Negative for: Chronic sinus problems/congestion; Middle ear problems Hematologic/Lymphatic Complaints and Symptoms: Negative for: Bleeding / Clotting Disorders; Human Immunodeficiency Virus Medical History: Negative for: Anemia; Hemophilia; Human Immunodeficiency Virus; Lymphedema; Sickle Cell Disease Cardiovascular Complaints and  Symptoms: Negative for: Chest pain; LE edema Medical History: Negative for: Angina; Arrhythmia; Congestive Heart Failure; Coronary Artery Disease; Deep Vein Thrombosis; Hypertension; Hypotension; Myocardial Infarction; Peripheral Arterial Disease; Peripheral Venous Disease; Phlebitis; Vasculitis Gastrointestinal Complaints and Symptoms: Negative for: Frequent diarrhea; Nausea; Vomiting Medical History: Negative for: Cirrhosis ; Colitis; Crohnos; Hepatitis A; Hepatitis B; Hepatitis C Endocrine Complaints and Symptoms: Negative for: Hepatitis; Thyroid disease; Polydypsia (Excessive Thirst) Medical HistoryJAKERRIA, KINGBIRD ([id number]) Negative for: Type I Diabetes; Type II Diabetes Genitourinary Complaints and Symptoms: Negative for: Kidney failure/ Dialysis; Incontinence/dribbling Medical History: Negative for: End Stage Renal Disease Past Medical History Notes: urostomy Immunological Complaints and Symptoms: Negative for: Hives; Itching Medical History: Negative for: Lupus Erythematosus; Raynaudos; Scleroderma Integumentary (Skin) Complaints and Symptoms: Positive for: Wounds Medical History: Positive for: History of pressure wounds Negative for: History of Burn Musculoskeletal Complaints and Symptoms: Negative for: Muscle Pain; Muscle Weakness Medical History: Negative for: Gout; Rheumatoid Arthritis; Osteoarthritis; Osteomyelitis Past Medical History Notes: right arm contracture Psychiatric Complaints and Symptoms: Negative for: Anxiety; Claustrophobia Respiratory Medical History: Positive for: Asthma; Chronic Obstructive Pulmonary Disease (COPD) Neurologic Medical History: Positive for: Paraplegia - C 3 paraplegia Oncologic Medical History: Negative for: Received Chemotherapy; Received Radiation TEMITOPE, FLAMMER ([id number]) Immunizations Pneumococcal Vaccine: Received Pneumococcal Vaccination: Yes Implantable Devices No devices added Hospitalization  / Surgery History Type of Hospitalization/Surgery collapsed lung and lung congestion Family and Social History Cancer: Yes - Mother; Diabetes: Yes - Siblings; Heart Disease: No; Hereditary Spherocytosis: No; Hypertension: Yes - Siblings; Kidney Disease: No; Lung Disease: No; Seizures: No; Stroke: No; Thyroid Problems: No; Tuberculosis: No; Former smoker - ended on 03/09/2019; Marital Status - Separated; Alcohol Use: Never; Drug Use: No History; Caffeine Use: Never; Financial Concerns: No; Food, Clothing or Shelter Needs: No; Support System Lacking: No; Transportation Concerns: No Psychologist, prison and probation services) Signed: 04/08/2019 5:14:16 PM By: Elliot Gurney, BSN, RN, CWS, Kim RN, BSN Signed: 04/08/2019 5:57:32 PM By: Lenda Kelp PA-C Entered By: Elliot Gurney, BSN, RN, CWS, Kim on 04/08/2019 13:25:51 Leas, [name] ([id number]) -------------------------------------------------------------------------------- SuperBill Details Patient Name: RAY, GLACKEN. Date of Service: 04/08/2019 Medical Record Number: [id number] Patient Account Number: [id number] Date of Birth/Sex: 09/13/1965 (53 y.o. F) Treating RN: Curtis Sites Primary Care Provider: [place], JULIE Other Clinician: Referring Provider: [place], JULIE Treating Provider/Extender: Linwood Dibbles, HOYT Weeks in Treatment: 0 Diagnosis Coding ICD-10 Codes Code Description L89.313 Pressure ulcer of right buttock, stage 3 L89.323 Pressure ulcer of left buttock, stage 3 L89.213 Pressure ulcer of right hip, stage 3 G82.52 Quadriplegia, C1-C4 incomplete E44.0 Moderate protein-calorie malnutrition Physician Procedures CPT4 Code:  [id number] Description: 99214 - WC PHYS LEVEL 4 - EST PT ICD-10 Diagnosis Description L89.313 Pressure ulcer of right buttock, stage 3 L89.323 Pressure ulcer of left buttock, stage 3 L89.213 Pressure ulcer of right hip, stage 3 G82.52 Quadriplegia, 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

## 2019-04-22 ENCOUNTER — Encounter: Payer: Medicare Other | Admitting: Physician Assistant

## 2019-04-22 ENCOUNTER — Other Ambulatory Visit: Payer: Self-pay

## 2019-04-22 DIAGNOSIS — L89213 Pressure ulcer of right hip, stage 3: Secondary | ICD-10-CM | POA: Diagnosis not present

## 2019-04-22 NOTE — Progress Notes (Addendum)
[name] ([id number]) Visit Report for 04/22/2019 Arrival Information Details Patient Name: [name], [name] Date of Service: 04/22/2019 2:00 PM Medical Record Number: [id number] Patient Account Number: [id number] Date of Birth/Sex: 1965/09/21 (53 y.o. F) Treating RN: Montey Hora Primary Care Abena Erdman: [place], JULIE Other Clinician: Referring Va Broadwell: [place], JULIE Treating Aizah Gehlhausen/Extender: Melburn Hake, HOYT Weeks in Treatment: 2 Visit Information History Since Last Visit Added or deleted any medications: No Patient Arrived: Wheel Chair Any new allergies or adverse reactions: No Arrival Time: 14:11 Had a fall or experienced change in No activities of daily living that may affect Accompanied By: boyfriend risk of falls: Transfer Assistance: Manual Signs or symptoms of abuse/neglect since last visito No Patient Identification Verified: Yes Hospitalized since last visit: No Secondary Verification Process Completed: Yes Implantable device outside of the clinic excluding No Patient Requires Transmission-Based No cellular tissue based products placed in the center Precautions: since last visit: Patient Has Alerts: No Has Dressing in Place as Prescribed: Yes Pain Present Now: No Electronic Signature(s) Signed: 04/22/2019 2:42:16 PM By: Lorine Bears RCP, RRT, CHT Entered By: Lorine Bears on 04/22/2019 14:11:50 [name] ([id number]) -------------------------------------------------------------------------------- Clinic Level of Care Assessment Details Patient Name: [name], [name] Date of Service: 04/22/2019 2:00 PM Medical Record Number: [id number] Patient Account Number: [id number] Date of Birth/Sex: 06/19/1966 (53 y.o. F) Treating RN: Montey Hora Primary Care Joni Norrod: [place], JULIE Other Clinician: Referring Lorraine Cimmino: [place], JULIE Treating Lazarius Rivkin/Extender: Melburn Hake, HOYT Weeks in Treatment: 2 Clinic Level of Care  Assessment Items TOOL 4 Quantity Score []  - Use when only an EandM is performed on FOLLOW-UP visit 0 ASSESSMENTS - Nursing Assessment / Reassessment X - Reassessment of Co-morbidities (includes updates in patient status) 1 10 X- 1 5 Reassessment of Adherence to Treatment Plan ASSESSMENTS - Wound and Skin Assessment / Reassessment []  - Simple Wound Assessment / Reassessment - one wound 0 X- 3 5 Complex Wound Assessment / Reassessment - multiple wounds []  - 0 Dermatologic / Skin Assessment (not related to wound area) ASSESSMENTS - Focused Assessment []  - Circumferential Edema Measurements - multi extremities 0 []  - 0 Nutritional Assessment / Counseling / Intervention []  - 0 Lower Extremity Assessment (monofilament, tuning fork, pulses) []  - 0 Peripheral Arterial Disease Assessment (using hand held doppler) ASSESSMENTS - Ostomy and/or Continence Assessment and Care []  - Incontinence Assessment and Management 0 []  - 0 Ostomy Care Assessment and Management (repouching, etc.) PROCESS - Coordination of Care X - Simple Patient / Family Education for ongoing care 1 15 []  - 0 Complex (extensive) Patient / Family Education for ongoing care X- 1 10 Staff obtains Programmer, systems, Records, Test Results / Process Orders []  - 0 Staff telephones HHA, Nursing Homes / Clarify orders / etc []  - 0 Routine Transfer to another Facility (non-emergent condition) []  - 0 Routine Hospital Admission (non-emergent condition) []  - 0 New Admissions / Biomedical engineer / Ordering NPWT, Apligraf, etc. []  - 0 Emergency Hospital Admission (emergent condition) X- 1 10 Simple Discharge Coordination [name], [name] ([id number]) []  - 0 Complex (extensive) Discharge Coordination PROCESS - Special Needs []  - Pediatric / Minor Patient Management 0 []  - 0 Isolation Patient Management []  - 0 Hearing / Language / Visual special needs []  - 0 Assessment of Community assistance (transportation, D/C planning,  etc.) []  - 0 Additional assistance / Altered mentation []  - 0 Support Surface(s) Assessment (bed, cushion, seat, etc.) INTERVENTIONS - Wound Cleansing / Measurement []  - Simple Wound Cleansing - one wound 0 X-  3 5 Complex Wound Cleansing - multiple wounds X- 1 5 Wound Imaging (photographs - any number of wounds) []  - 0 Wound Tracing (instead of photographs) []  - 0 Simple Wound Measurement - one wound X- 3 5 Complex Wound Measurement - multiple wounds INTERVENTIONS - Wound Dressings X - Small Wound Dressing one or multiple wounds 3 10 []  - 0 Medium Wound Dressing one or multiple wounds []  - 0 Large Wound Dressing one or multiple wounds []  - 0 Application of Medications - topical []  - 0 Application of Medications - injection INTERVENTIONS - Miscellaneous []  - External ear exam 0 []  - 0 Specimen Collection (cultures, biopsies, blood, body fluids, etc.) []  - 0 Specimen(s) / Culture(s) sent or taken to Lab for analysis []  - 0 Patient Transfer (multiple staff / / Similar devices) []  - 0 Simple Staple / Suture removal (25 or less) []  - 0 Complex Staple / Suture removal (26 or more) []  - 0 Hypo / Hyperglycemic Management (close monitor of Blood Glucose) []  - 0 Ankle / Brachial Index (ABI) - do not check if billed separately X- 1 5 Vital Signs [name], [name] ( ) Has the patient been seen at the hospital within the last three years: Yes Total Score: 135 Level Of Care: New/Established - Level 4 Electronic Signature(s) Signed: 04/22/2019 4:44:40 PM By: Entered By: on 04/22/2019 15:10:21 Shumard, ( ) -------------------------------------------------------------------------------- Encounter Discharge Information Details Patient Name: [name] Date of Service: 04/22/2019 2:00 PM Medical Record Number: Nurse, adult Patient Account Number: Date of Birth/Sex: Oct 02, 1965 (53 y.o.  F) Treating RN: Primary Care Dawsyn Ramsaran: [id number], JULIE Other Clinician: Referring Anaclara Acklin: 04/24/2019, JULIE Treating Treydon Henricks/Extender: Curtis Sites, HOYT Weeks in Treatment: 2 Encounter Discharge Information Items Discharge Condition: Stable Ambulatory Status: Wheelchair Discharge Destination: Home Transportation: Private Auto Accompanied By: spouse Schedule Follow-up Appointment: Yes Clinical Summary of Care: Electronic Signature(s) Signed: 04/22/2019 4:44:40 PM By: 04/24/2019 Entered By: [name] [name] on 04/22/2019 15:11:23 Paz, [name] (04/24/2019) -------------------------------------------------------------------------------- Lower Extremity Assessment Details Patient Name: KOBY, HARTFIELD. Date of Service: 04/22/2019 2:00 PM Medical Record Number: 09/21/1965 Patient Account Number: [id number] Date of Birth/Sex: [birth year] (53 y.o. F) Treating RN: [place] Primary Care Kazi Montoro: Linwood Dibbles, JULIE Other Clinician: Referring Evie Crumpler: 04/24/2019, JULIE Treating Welden Hausmann/Extender: Curtis Sites, HOYT Weeks in Treatment: 2 Notes Wounds in sacral area Electronic Signature(s) Signed: 04/22/2019 4:07:15 PM By: 04/24/2019, BSN, RN, CWS, Kim RN, BSN Entered By: [name] [name], BSN, RN, CWS, Kim on 04/22/2019 14:36:30 Schoeneck, [name] (04/24/2019) -------------------------------------------------------------------------------- Multi Wound Chart Details Patient Name: ATHALIAH, BAUMBACH. Date of Service: 04/22/2019 2:00 PM Medical Record Number: 09/21/1965 Patient Account Number: 40 Date of Birth/Sex: 1966-05-03 (53 y.o. F) Treating RN: [place] Primary Care Zulema Pulaski: Linwood Dibbles, JULIE Other Clinician: Referring Jennifer Holland: 04/24/2019, JULIE Treating Caelynn Marshman/Extender: STONE III, HOYT Weeks in Treatment: 2 Vital Signs Height(in): 63 Pulse(bpm): 79 Weight(lbs): 70 Blood Pressure(mmHg): 100/64 Body Mass Index(BMI): 12 Temperature(F): 98.3 Respiratory  Rate 16 (breaths/min): Photos: Wound Location: Right Gluteus Right Sacrum Right Trochanter Wounding Event: Gradually Appeared Pressure Injury Gradually Appeared Primary Etiology: Pressure Ulcer Pressure Ulcer Pressure Ulcer Comorbid History: Asthma, Chronic Obstructive Asthma, Chronic Obstructive Asthma, Chronic Obstructive Pulmonary Disease (COPD), Pulmonary Disease (COPD), Pulmonary Disease (COPD), History of pressure wounds, History of pressure wounds, History of pressure wounds, Paraplegia Paraplegia Paraplegia Date Acquired: 10/16/2018 10/16/2018 04/02/2018 Weeks of Treatment: 2 2 2  Wound Status: Open Open Open Clustered Wound: Yes No No Clustered Quantity: 3 N/A N/A Measurements L  x W x D 3x2x1 5.5x7x0.5 6x4x0.5 (cm) Area (cm) : 4.712 30.238 18.85 Volume (cm) : 4.712 15.119 9.425 % Reduction in Area: 90.00% -25525.40% -20.00% % Reduction in Volume: 85.70% -125891.70% 33.30% Starting Position 1 11 (o'clock): Ending Position 1 4 (o'clock): Maximum Distance 1 (cm): 2 Undermining: No No Yes Classification: Category/Stage III Category/Stage III Category/Stage III Exudate Amount: Large Medium Medium Exudate Type: Serous Serous Serous Exudate Color: amber amber amber Wound Margin: Flat and Intact Flat and Intact Flat and Intact Mizzell, Stepfanie [name]. ([id number]) Granulation Amount: Medium (34-66%) Medium (34-66%) Medium (34-66%) Granulation Quality: Red Red Red Necrotic Amount: Medium (34-66%) Medium (34-66%) Medium (34-66%) Necrotic Tissue: Eschar, Adherent Slough Eschar Eschar Exposed Structures: Fat Layer (Subcutaneous Fat Layer (Subcutaneous Fat Layer (Subcutaneous Tissue) Exposed: Yes Tissue) Exposed: Yes Tissue) Exposed: Yes Fascia: No Fascia: No Fascia: No Tendon: No Tendon: No Tendon: No Muscle: No Muscle: No Muscle: No Joint: No Joint: No Joint: No Bone: No Bone: No Bone: No Epithelialization: Small (1-33%) Small (1-33%) Small (1-33%) Treatment  Notes Electronic Signature(s) Signed: 04/22/2019 4:44:40 PM By: Curtis Sites Entered By: Curtis Sites on 04/22/2019 15:07:50 Juncaj, [name] [name] ([id number]) -------------------------------------------------------------------------------- Multi-Disciplinary Care Plan Details Patient Name: REKITA, MIOTKE. Date of Service: 04/22/2019 2:00 PM Medical Record Number: [id number] Patient Account Number: [id number] Date of Birth/Sex: [birth year] (53 y.o. F) Treating RN: Curtis Sites Primary Care Avyon Herendeen: [place], JULIE Other Clinician: Referring Kymberli Wiegand: [place], JULIE Treating Chancy Smigiel/Extender: Linwood Dibbles, HOYT Weeks in Treatment: 2 Active Inactive Abuse / Safety / Falls / Self Care Management Nursing Diagnoses: Impaired physical mobility Goals: Patient will not develop complications from immobility Date Initiated: 04/08/2019 Target Resolution Date: 07/12/2019 Goal Status: Active Interventions: Assess fall risk on admission and as needed Notes: Nutrition Nursing Diagnoses: Potential for alteratiion in Nutrition/Potential for imbalanced nutrition Goals: Patient/caregiver agrees to and verbalizes understanding of need to use nutritional supplements and/or vitamins as prescribed Date Initiated: 04/08/2019 Target Resolution Date: 07/12/2019 Goal Status: Active Interventions: Assess patient nutrition upon admission and as needed per policy Notes: Orientation to the Wound Care Program Nursing Diagnoses: Knowledge deficit related to the wound healing center program Goals: Patient/caregiver will verbalize understanding of the Wound Healing Center Program Date Initiated: 04/08/2019 Target Resolution Date: 07/12/2019 Goal Status: Active Interventions: Provide education on orientation to the wound center DANILYN, COCKE. ([id number]) Notes: Pressure Nursing Diagnoses: Knowledge deficit related to management of pressures ulcers Goals: Patient will remain free of pressure ulcers Date  Initiated: 04/08/2019 Target Resolution Date: 07/12/2019 Goal Status: Active Interventions: Assess potential for pressure ulcer upon admission and as needed Notes: Wound/Skin Impairment Nursing Diagnoses: Impaired tissue integrity Goals: Ulcer/skin breakdown will heal within 14 weeks Date Initiated: 04/08/2019 Target Resolution Date: 07/12/2019 Goal Status: Active Interventions: Assess patient/caregiver ability to obtain necessary supplies Assess patient/caregiver ability to perform ulcer/skin care regimen upon admission and as needed Assess ulceration(s) every visit Notes: Electronic Signature(s) Signed: 04/22/2019 4:44:40 PM By: Curtis Sites Entered By: Curtis Sites on 04/22/2019 15:07:41 Oertel, Amyri [name]. ([id number]) -------------------------------------------------------------------------------- Pain Assessment Details Patient Name: [name] Date of Service: 04/22/2019 2:00 PM Medical Record Number: [id number] Patient Account Number: [id number] Date of Birth/Sex: Jul 06, 1965 (53 y.o. F) Treating RN: Curtis Sites Primary Care Cindee Mclester: [place], JULIE Other Clinician: Referring Amazin Pincock: [place], JULIE Treating Brayla Pat/Extender: STONE III, HOYT Weeks in Treatment: 2 Active Problems Location of Pain Severity and Description of Pain Patient Has Paino No Site Locations Pain Management and Medication Current Pain Management: Electronic Signature(s) Signed: 04/22/2019 2:42:16 PM By: Weyman Rodney, Sallie RCP,  RRT, CHT Signed: 04/22/2019 4:44:40 PM By: Curtis Sitesorthy, Joanna Entered By: Dayton MartesWallace, RCP,RRT,CHT, Sallie on 04/22/2019 14:11:57 Beman, [name] QuailsHARLOTTE [name]. ([id number]) -------------------------------------------------------------------------------- Patient/Caregiver Education Details Patient Name: [name], [name] Date of Service: 04/22/2019 2:00 PM Medical Record Number: [id number] Patient Account Number: [id number] Date of Birth/Gender: 03/19/1966 (53 y.o.  F) Treating RN: Curtis Sitesorthy, Joanna Primary Care Physician: [place], JULIE Other Clinician: Referring Physician: [place], JULIE Treating Physician/Extender: Skeet SimmerSTONE III, HOYT Weeks in Treatment: 2 Education Assessment Education Provided To: Patient and Caregiver Education Topics Provided Wound/Skin Impairment: Handouts: Other: wound care as ordered Methods: Demonstration, Explain/Verbal Responses: State content correctly Electronic Signature(s) Signed: 04/22/2019 4:44:40 PM By: Curtis Sitesorthy, Joanna Entered By: Curtis Sitesorthy, Joanna on 04/22/2019 15:10:47 Nylund, Crystle [name]. ([id number]) -------------------------------------------------------------------------------- Wound Assessment Details Patient Name: [name], [name] Date of Service: 04/22/2019 2:00 PM Medical Record Number: [id number] Patient Account Number: [id number] Date of Birth/Sex: 03/19/1966 (53 y.o. F) Treating RN: Huel CoventryWoody, Kim Primary Care Blen Ransome: [place], JULIE Other Clinician: Referring Nini Cavan: [place], JULIE Treating Milbern Doescher/Extender: STONE III, HOYT Weeks in Treatment: 2 Wound Status Wound Number: 5 Primary Pressure Ulcer Etiology: Wound Location: Right Gluteus Wound Open Wounding Event: Gradually Appeared Status: Date Acquired: 10/16/2018 Comorbid Asthma, Chronic Obstructive Pulmonary Weeks Of Treatment: 2 History: Disease (COPD), History of pressure wounds, Clustered Wound: Yes Paraplegia Photos Wound Measurements Length: (cm) 3 % Reduction i Width: (cm) 2 % Reduction i Depth: (cm) 1 Epithelializa Clustered Quantity: 3 Tunneling: Area: (cm) 4.712 Undermining: Volume: (cm) 4.712 n Area: 90% n Volume: 85.7% tion: Small (1-33%) No No Wound Description Classification: Category/Stage III Foul Odor Aft Wound Margin: Flat and Intact Slough/Fibrin Exudate Amount: Large Exudate Type: Serous Exudate Color: amber er Cleansing: No o Yes Wound Bed Granulation Amount: Medium (34-66%) Exposed Structure Granulation  Quality: Red Fascia Exposed: No Necrotic Amount: Medium (34-66%) Fat Layer (Subcutaneous Tissue) Exposed: Yes Necrotic Quality: Eschar, Adherent Slough Tendon Exposed: No Muscle Exposed: No Joint Exposed: No Bone Exposed: No Siwik, [name] ([id number]) Treatment Notes Wound #5 (Right Gluteus) Notes hydrofera blue, BFD Electronic Signature(s) Signed: 04/22/2019 4:07:15 PM By: Elliot GurneyWoody, BSN, RN, CWS, Kim RN, BSN Entered By: Elliot GurneyWoody, BSN, RN, CWS, Kim on 04/22/2019 14:33:35 Pinon, [name] QuailsHARLOTTE [name]. ([id number]) -------------------------------------------------------------------------------- Wound Assessment Details Patient Name: [name], [name] Date of Service: 04/22/2019 2:00 PM Medical Record Number: [id number] Patient Account Number: [id number] Date of Birth/Sex: 03/19/1966 (53 y.o. F) Treating RN: Huel CoventryWoody, Kim Primary Care Orella Cushman: [place], JULIE Other Clinician: Referring Lyrik Dockstader: [place], JULIE Treating Katherine Syme/Extender: STONE III, HOYT Weeks in Treatment: 2 Wound Status Wound Number: 6 Primary Pressure Ulcer Etiology: Wound Location: Right Sacrum Wound Open Wounding Event: Pressure Injury Status: Date Acquired: 10/16/2018 Comorbid Asthma, Chronic Obstructive Pulmonary Weeks Of Treatment: 2 History: Disease (COPD), History of pressure wounds, Clustered Wound: No Paraplegia Photos Wound Measurements Length: (cm) 5.5 % Reduction in Width: (cm) 7 % Reduction in Depth: (cm) 0.5 Epithelializati Area: (cm) 30.238 Tunneling: Volume: (cm) 15.119 Undermining: Area: -25525.4% Volume: -125891.7% on: Small (1-33%) No No Wound Description Classification: Category/Stage III Foul Odor After Wound Margin: Flat and Intact Slough/Fibrino Exudate Amount: Medium Exudate Type: Serous Exudate Color: amber Cleansing: No No Wound Bed Granulation Amount: Medium (34-66%) Exposed Structure Granulation Quality: Red Fascia Exposed: No Necrotic Amount: Medium (34-66%) Fat Layer  (Subcutaneous Tissue) Exposed: Yes Necrotic Quality: Eschar Tendon Exposed: No Muscle Exposed: No Joint Exposed: No Bone Exposed: No Treatment Notes [name], Taniya [name]. ([id number]) Wound #6 (Right Sacrum) Notes hydrofera blue, BFD Electronic Signature(s) Signed: 04/22/2019 4:07:15 PM By: Elliot GurneyWoody, BSN, RN, CWS, Kim RN, BSN Entered  By: Elliot Gurney, BSN, RN, CWS, Kim on 04/22/2019 14:34:30 Seawright, [name] [name] ([id number]) -------------------------------------------------------------------------------- Wound Assessment Details Patient Name: [name], KOPP. Date of Service: 04/22/2019 2:00 PM Medical Record Number: [id number] Patient Account Number: [id number] Date of Birth/Sex: 1966/06/28 (53 y.o. F) Treating RN: Huel Coventry Primary Care Seniyah Esker: [place], JULIE Other Clinician: Referring Mica Ramdass: [place], JULIE Treating Edinson Domeier/Extender: STONE III, HOYT Weeks in Treatment: 2 Wound Status Wound Number: 7 Primary Pressure Ulcer Etiology: Wound Location: Right Trochanter Wound Open Wounding Event: Gradually Appeared Status: Date Acquired: 04/02/2018 Comorbid Asthma, Chronic Obstructive Pulmonary Weeks Of Treatment: 2 History: Disease (COPD), History of pressure wounds, Clustered Wound: No Paraplegia Photos Wound Measurements Length: (cm) 6 % Reduction in Width: (cm) 4 % Reduction in Depth: (cm) 0.5 Epithelializati Area: (cm) 18.85 Tunneling: Volume: (cm) 9.425 Undermining: Starting Pos Ending Posit Maximum Dist Area: -20% Volume: 33.3% on: Small (1-33%) No Yes ition (o'clock): 11 ion (o'clock): 4 ance: (cm) 2 Wound Description Classification: Category/Stage III Foul Odor After Wound Margin: Flat and Intact Slough/Fibrino Exudate Amount: Medium Exudate Type: Serous Exudate Color: amber Cleansing: No No Wound Bed Granulation Amount: Medium (34-66%) Exposed Structure Granulation Quality: Red Fascia Exposed: No Necrotic Amount: Medium (34-66%) Fat Layer  (Subcutaneous Tissue) Exposed: Yes Necrotic Quality: Eschar Tendon Exposed: No Muscle Exposed: No Joint Exposed: No Rix, [name] ([id number]) Bone Exposed: No Treatment Notes Wound #7 (Right Trochanter) Notes hydrofera blue, BFD Electronic Signature(s) Signed: 04/22/2019 4:07:15 PM By: Elliot Gurney, BSN, RN, CWS, Kim RN, BSN Entered By: Elliot Gurney, BSN, RN, CWS, Kim on 04/22/2019 14:36:07 Ladson, [name] [name] ([id number]) -------------------------------------------------------------------------------- Vitals Details Patient Name: [name] Date of Service: 04/22/2019 2:00 PM Medical Record Number: [id number] Patient Account Number: [id number] Date of Birth/Sex: [birth year] (53 y.o. F) Treating RN: Curtis Sites Primary Care Rolla Servidio: [place], JULIE Other Clinician: Referring Tremont Gavitt: [place], JULIE Treating Kristina Mcnorton/Extender: STONE III, HOYT Weeks in Treatment: 2 Vital Signs Time Taken: 14:10 Temperature (F): 98.3 Height (in): 63 Pulse (bpm): 79 Weight (lbs): 70 Respiratory Rate (breaths/min): 16 Body Mass Index (BMI): 12.4 Blood Pressure (mmHg): 100/64 Reference Range: 80 - 120 mg / dl Electronic Signature(s) Signed: 04/22/2019 2:42:16 PM By: Dayton Martes RCP, RRT, CHT Entered By: Dayton Martes on 04/22/2019 14:13:09

## 2019-04-22 NOTE — Progress Notes (Addendum)
NEJLA, REASOR ([id number]) Visit Report for 04/22/2019 Chief Complaint Document Details Patient Name: [name], [name] Date of Service: 04/22/2019 2:00 PM Medical Record Number: [id number] Patient Account Number: [id number] Date of Birth/Sex: Oct 22, 1965 (53 y.o. F) Treating RN: Curtis Sites Primary Care Provider: [place], JULIE Other Clinician: Referring Provider: [place], JULIE Treating Provider/Extender: Linwood Dibbles, HOYT Weeks in Treatment: 2 Information Obtained from: Patient Chief Complaint Bilateral gluteal and right hip pressure ulcers Electronic Signature(s) Signed: 04/22/2019 2:09:52 PM By: Lenda Kelp PA-C Entered By: Lenda Kelp on 04/22/2019 14:09:52 Jaye, [name] ([id number]) -------------------------------------------------------------------------------- HPI Details Patient Name: [name], [name] Date of Service: 04/22/2019 2:00 PM Medical Record Number: [id number] Patient Account Number: [id number] Date of Birth/Sex: [birth year] (53 y.o. F) Treating RN: Curtis Sites Primary Care Provider: [place], JULIE Other Clinician: Referring Provider: [place], JULIE Treating Provider/Extender: STONE III, HOYT Weeks in Treatment: 2 History of Present Illness Location: right scapular region Quality: Patient reports No Pain. Severity: Patient states wound (s) are getting better. Duration: Patient has had the wound for > 2 weeks prior to seeking treatment at the wound center Context: The wound would happen gradually Modifying Factors: Other treatment(s) tried include:been in hospital sick with a pneumonia and has had problems with transportation Associated Signs and Symptoms: Patient reports having:healed the wound on her sacral region HPI Description: 53 year old patient who was been an unreliable historian says she had completely healed in February and most recently her home health noticed that the wound on the right scapular area had reopened. She was in  hospital about a month ago with pneumonia at that time a workup was done. We have no notes today but will workup the electronic medical record. on review of her records electronically I do not find any recent hospital MRI in the last 6 months. She had a CT scan of the chest in February 2018 which does not show any osseous lesions. she was admitted to the hospital between May 14 and May 17 for 3 days for mucus plugging and collapse of the left lung. at that time she had a decubitus ulcer of the right scapular region stage III. her history was noted to have quadriplegia C5-C7 incomplete, neurogenic bladder, nephrostomy catheter and emphysema. patient tells me that she has not smoked for last month and a half but as per her hospital records she was smoking in the middle of May. 01/12/17 patient scapular region appears to be doing fairly well on evaluation today. She is having really no syndicate discomfort and has been tolerating the dressing changes well complication. Readmission: 04/08/2019 patient presents today for reevaluation in our clinic concerning issues she is having with wounds on the bilateral gluteal area as well as on the right trochanter. She has unfortunately noted that things seem to have gotten worse as she was using the Clinitron bed. The patient states it gives off a lot of heat and subsequently has not been good for her skin causing her to breakdown more. She has been trying to get back just to the air mattress but has not been able to get the company to come pick up that bed and bring out the air mattress. Her primary care provider is on this as well trying to get them to do so. Unfortunately her wounds are significantly large and show signs of hyper granulation as well. She has had this kind of issue for quite some time and unfortunately just does not seem to be showing signs of significant improvement with what she  has been using currently. No fevers, chills, nausea, vomiting,  or diarrhea. She does attempt appropriate offloading and again has been using the bed because that is all she has although she is not happy with it. 04/22/2019 on evaluation today patient appears to be doing about the same with regard to her wounds at this time. Fortunately there does not appear to be any signs of active infection at this point which is good news. She has been tolerating the dressing changes without complication. Overall I am pleased with that with that being said unfortunately she does have a little bit of more significant breakdown with regard to the wound bed upon evaluation today. I believe this is secondary to the fact that she has been lying potentially too much in a single position for too long over the past several weeks. I think that potentially if she were to mitigate this to a degree that things may improve somewhat. With that being said she is really not happy with her current bed she is attempted to have them come out and switch this back to an air mattress but unfortunately has not been successful in getting them to come out and make this change. She is very frustrated with that. She still has the Clinitron bed and she much prefers just having the standard air mattress. Patient's primary care provider still working on this she tells me. ======= Old notes SETH, FRIEDLANDER ([id number]) 53 year old patient was had quadriparesis for the last 9 years and has had previous extensive surgery and reconstruction at [facility]. For about 4-5 months she's had a decubitus ulcer on the right scapular region. she was lost to follow-up for the last 6 months. she had a couple of small areas which have opened out in the region of previous scar tissue in the sacral region but these haveall healed now. She does not have any other significant comorbidities. She has a good Roho cushion for her wheelchair and she has a specialized mattress for sleep. She has restarted  smoking and smokes about half a pack of cigarettes a day 02/18/2015 -- X-ray of the pelvis -- IMPRESSION:No acute osseous injury of the pelvis. If there is clinical concern regarding osteomyelitis, recommend further evaluation with CT. X-ray of the sacrum and coccyx IMPRESSION:No acute osseous abnormality of the sacrum. If there is further clinical concern regarding osteomyelitis of the sacrum, further evaluation with CT is recommended. ========= Electronic Signature(s) Signed: 04/22/2019 5:53:48 PM By: Lenda Kelp PA-C Entered By: Lenda Kelp on 04/22/2019 17:53:48 Flury, [name] ([id number]) -------------------------------------------------------------------------------- Physical Exam Details Patient Name: [name], [name] Date of Service: 04/22/2019 2:00 PM Medical Record Number: [id number] Patient Account Number: [id number] Date of Birth/Sex: 1966-03-10 (53 y.o. F) Treating RN: Curtis Sites Primary Care Provider: [place], JULIE Other Clinician: Referring Provider: [place], JULIE Treating Provider/Extender: STONE III, HOYT Weeks in Treatment: 2 Constitutional Thin and well-hydrated in no acute distress. Respiratory normal breathing without difficulty. clear to auscultation bilaterally. Cardiovascular regular rate and rhythm with normal S1, S2. Psychiatric this patient is able to make decisions and demonstrates good insight into disease process. Alert and Oriented x 3. pleasant and cooperative. Notes Upon evaluation patient's wounds are not showing any signs of severe worsening although she does not have a lot of tissue overlying the bone and there is no bone exposure at this point obviously were trying to avoid that. I do believe the [facility] still appropriate for her since she does have quite a bit  of hyper granulation still noted at this point. No fevers, chills, nausea, vomiting, or diarrhea. Electronic Signature(s) Signed: 04/22/2019 5:54:25 PM By: Lenda Kelp PA-C Entered By: Lenda Kelp on 04/22/2019 17:54:25 Searles, [name] ([id number]) -------------------------------------------------------------------------------- Physician Orders Details Patient Name: MAGHEN, GROUP. Date of Service: 04/22/2019 2:00 PM Medical Record Number: [id number] Patient Account Number: [id number] Date of Birth/Sex: [birth year] (53 y.o. F) Treating RN: Curtis Sites Primary Care Provider: [place], JULIE Other Clinician: Referring Provider: [place], JULIE Treating Provider/Extender: Linwood Dibbles, HOYT Weeks in Treatment: 2 Verbal / Phone Orders: No Diagnosis Coding ICD-10 Coding Code Description L89.313 Pressure ulcer of right buttock, stage 3 L89.323 Pressure ulcer of left buttock, stage 3 L89.213 Pressure ulcer of right hip, stage 3 G82.52 Quadriplegia, C1-C4 incomplete E44.0 Moderate protein-calorie malnutrition Wound Cleansing Wound #5 Right Gluteus o Clean wound with Normal Saline. o May Shower, gently pat wound dry prior to applying new dressing. Wound #6 Right Sacrum o Clean wound with Normal Saline. o May Shower, gently pat wound dry prior to applying new dressing. Wound #7 Right Trochanter o Clean wound with Normal Saline. o May Shower, gently pat wound dry prior to applying new dressing. Primary Wound Dressing Wound #5 Right Gluteus o Hydrafera Blue Ready Transfer Wound #6 Right Sacrum o Hydrafera Blue Ready Transfer Wound #7 Right Trochanter o Hydrafera Blue Ready Transfer Secondary Dressing Wound #5 Right Gluteus o Boardered Foam Dressing Wound #6 Right Sacrum o Boardered Foam Dressing Wound #7 Right Trochanter o Boardered Foam Dressing Dressing Change Frequency BRISEIDA, GITTINGS. ([id number]) Wound #5 Right Gluteus o Change Dressing Monday, Wednesday, Friday - or three times weekly Wound #6 Right Sacrum o Change Dressing Monday, Wednesday, Friday - or three times weekly Wound #7 Right  Trochanter o Change Dressing Monday, Wednesday, Friday - or three times weekly Follow-up Appointments o Return Appointment in 2 weeks. Off-Loading Wound #5 Right Gluteus o Mattress - air mattress or fluidized air mattress o Turn and reposition every 2 hours Wound #6 Right Sacrum o Mattress - air mattress or fluidized air mattress o Turn and reposition every 2 hours Wound #7 Right Trochanter o Mattress - air mattress or fluidized air mattress o Turn and reposition every 2 hours Additional Orders / Instructions Wound #5 Right Gluteus o Increase protein intake. Wound #6 Right Sacrum o Increase protein intake. Wound #7 Right Trochanter o Increase protein intake. Home Health Wound #5 Right Gluteus o Continue Home Health Visits - [facility] - Please provide dressing materials for patient o Home Health Nurse may visit PRN to address patientos wound care needs. o FACE TO FACE ENCOUNTER: MEDICARE and MEDICAID PATIENTS: I certify that this patient is under my care and that I had a face-to-face encounter that meets the physician face-to-face encounter requirements with this patient on this date. The encounter with the patient was in whole or in part for the following MEDICAL CONDITION: (primary reason for Home Healthcare) MEDICAL NECESSITY: I certify, that based on my findings, NURSING services are a medically necessary home health service. HOME BOUND STATUS: I certify that my clinical findings support that this patient is homebound (i.e., Due to illness or injury, pt requires aid of supportive devices such as crutches, cane, wheelchairs, walkers, the use of special transportation or the assistance of another person to leave their place of residence. There is a normal inability to leave the home and doing so requires considerable and taxing effort. Other absences are for medical reasons / religious services and are infrequent or  of short duration when for other  reasons). o If current dressing causes regression in wound condition, may D/C ordered dressing product/s and apply Normal Saline Moist Dressing daily until next Wound Healing Center / Other MD appointment. Notify Wound Healing Center of regression in wound condition at [phone]. o Please direct any NON-WOUND related issues/requests for orders to patient's Primary Care Physician Wound #6 Right Sacrum o Continue Home Health Visits - [facility] - Please provide dressing materials for patient o Home Health Nurse may visit PRN to address patientos wound care needs. GYPSY, KELLOGG ([id number]) o FACE TO FACE ENCOUNTER: MEDICARE and MEDICAID PATIENTS: I certify that this patient is under my care and that I had a face-to-face encounter that meets the physician face-to-face encounter requirements with this patient on this date. The encounter with the patient was in whole or in part for the following MEDICAL CONDITION: (primary reason for Home Healthcare) MEDICAL NECESSITY: I certify, that based on my findings, NURSING services are a medically necessary home health service. HOME BOUND STATUS: I certify that my clinical findings support that this patient is homebound (i.e., Due to illness or injury, pt requires aid of supportive devices such as crutches, cane, wheelchairs, walkers, the use of special transportation or the assistance of another person to leave their place of residence. There is a normal inability to leave the home and doing so requires considerable and taxing effort. Other absences are for medical reasons / religious services and are infrequent or of short duration when for other reasons). o If current dressing causes regression in wound condition, may D/C ordered dressing product/s and apply Normal Saline Moist Dressing daily until next Wound Healing Center / Other MD appointment. Notify Wound Healing Center of regression in wound condition at [phone]. o Please  direct any NON-WOUND related issues/requests for orders to patient's Primary Care Physician Wound #7 Right Trochanter o Continue Home Health Visits - [facility] - Please provide dressing materials for patient o Home Health Nurse may visit PRN to address patientos wound care needs. o FACE TO FACE ENCOUNTER: MEDICARE and MEDICAID PATIENTS: I certify that this patient is under my care and that I had a face-to-face encounter that meets the physician face-to-face encounter requirements with this patient on this date. The encounter with the patient was in whole or in part for the following MEDICAL CONDITION: (primary reason for Home Healthcare) MEDICAL NECESSITY: I certify, that based on my findings, NURSING services are a medically necessary home health service. HOME BOUND STATUS: I certify that my clinical findings support that this patient is homebound (i.e., Due to illness or injury, pt requires aid of supportive devices such as crutches, cane, wheelchairs, walkers, the use of special transportation or the assistance of another person to leave their place of residence. There is a normal inability to leave the home and doing so requires considerable and taxing effort. Other absences are for medical reasons / religious services and are infrequent or of short duration when for other reasons). o If current dressing causes regression in wound condition, may D/C ordered dressing product/s and apply Normal Saline Moist Dressing daily until next Wound Healing Center / Other MD appointment. Notify Wound Healing Center of regression in wound condition at [phone]. o Please direct any NON-WOUND related issues/requests for orders to patient's Primary Care Physician Electronic Signature(s) Signed: 04/22/2019 4:44:40 PM By: Curtis Sites Signed: 04/22/2019 7:13:08 PM By: Lenda Kelp PA-C Entered By: Curtis Sites on 04/22/2019 15:09:29 Bonito, Flynn M.  ([id number]) -------------------------------------------------------------------------------- Problem  List Details Patient Name: [name], [name] Date of Service: 04/22/2019 2:00 PM Medical Record Number: [id number] Patient Account Number: [id number] Date of Birth/Sex: 1965/09/03 (53 y.o. F) Treating RN: Curtis Sites Primary Care Provider: [place], JULIE Other Clinician: Referring Provider: [place], JULIE Treating Provider/Extender: Linwood Dibbles, HOYT Weeks in Treatment: 2 Active Problems ICD-10 Evaluated Encounter Code Description Active Date Today Diagnosis L89.313 Pressure ulcer of right buttock, stage 3 04/08/2019 No Yes L89.323 Pressure ulcer of left buttock, stage 3 04/08/2019 No Yes L89.213 Pressure ulcer of right hip, stage 3 04/08/2019 No Yes G82.52 Quadriplegia, C1-C4 incomplete 04/08/2019 No Yes E44.0 Moderate protein-calorie malnutrition 04/08/2019 No Yes Inactive Problems Resolved Problems Electronic Signature(s) Signed: 04/22/2019 2:09:36 PM By: Lenda Kelp PA-C Entered By: Lenda Kelp on 04/22/2019 14:09:35 Daughtry, [name] ([id number]) -------------------------------------------------------------------------------- Progress Note Details Patient Name: [name] Date of Service: 04/22/2019 2:00 PM Medical Record Number: [id number] Patient Account Number: [id number] Date of Birth/Sex: Nov 13, 1965 (53 y.o. F) Treating RN: Curtis Sites Primary Care Provider: [place], JULIE Other Clinician: Referring Provider: [place], JULIE Treating Provider/Extender: Linwood Dibbles, HOYT Weeks in Treatment: 2 Subjective Chief Complaint Information obtained from Patient Bilateral gluteal and right hip pressure ulcers History of Present Illness (HPI) The following HPI elements were documented for the patient's wound: Location: right scapular region Quality: Patient reports No Pain. Severity: Patient states wound (s) are getting better. Duration: Patient has had the wound for  > 2 weeks prior to seeking treatment at the wound center Context: The wound would happen gradually Modifying Factors: Other treatment(s) tried include:been in hospital sick with a pneumonia and has had problems with transportation Associated Signs and Symptoms: Patient reports having:healed the wound on her sacral region 53 year old patient who was been an unreliable historian says she had completely healed in February and most recently her home health noticed that the wound on the right scapular area had reopened. She was in hospital about a month ago with pneumonia at that time a workup was done. We have no notes today but will workup the electronic medical record. on review of her records electronically I do not find any recent hospital MRI in the last 6 months. She had a CT scan of the chest in February 2018 which does not show any osseous lesions. she was admitted to the hospital between May 14 and May 17 for 3 days for mucus plugging and collapse of the left lung. at that time she had a decubitus ulcer of the right scapular region stage III. her history was noted to have quadriplegia C5-C7 incomplete, neurogenic bladder, nephrostomy catheter and emphysema. patient tells me that she has not smoked for last month and a half but as per her hospital records she was smoking in the middle of May. 01/12/17 patient scapular region appears to be doing fairly well on evaluation today. She is having really no syndicate discomfort and has been tolerating the dressing changes well complication. Readmission: 04/08/2019 patient presents today for reevaluation in our clinic concerning issues she is having with wounds on the bilateral gluteal area as well as on the right trochanter. She has unfortunately noted that things seem to have gotten worse as she was using the Cl[place]ron bed. The patient states it gives off a lot of heat and subsequently has not been good for her skin causing her to breakdown more.  She has been trying to get back just to the air mattress but has not been able to get the company to come pick up that  bed and bring out the air mattress. Her primary care provider is on this as well trying to get them to do so. Unfortunately her wounds are significantly large and show signs of hyper granulation as well. She has had this kind of issue for quite some time and unfortunately just does not seem to be showing signs of significant improvement with what she has been using currently. No fevers, chills, nausea, vomiting, or diarrhea. She does attempt appropriate offloading and again has been using the bed because that is all she has although she is not happy with it. 04/22/2019 on evaluation today patient appears to be doing about the same with regard to her wounds at this time. Fortunately there does not appear to be any signs of active infection at this point which is good news. She has been tolerating the dressing changes without complication. Overall I am pleased with that with that being said unfortunately she does have a little bit of more significant breakdown with regard to the wound bed upon evaluation today. I believe this is secondary to the fact that she has been lying potentially too much in a single position for too long over the past several weeks. I think that AIKAM, HELLICKSON. ([id number]) potentially if she were to mitigate this to a degree that things may improve somewhat. With that being said she is really not happy with her current bed she is attempted to have them come out and switch this back to an air mattress but unfortunately has not been successful in getting them to come out and make this change. She is very frustrated with that. She still has the Clinitron bed and she much prefers just having the standard air mattress. Patient's primary care provider still working on this she tells me. ======= Old notes 53 year old patient was had quadriparesis for the last 9  years and has had previous extensive surgery and reconstruction at [facility]. For about 4-5 months she's had a decubitus ulcer on the right scapular region. she was lost to follow-up for the last 6 months. she had a couple of small areas which have opened out in the region of previous scar tissue in the sacral region but these haveall healed now. She does not have any other significant comorbidities. She has a good Roho cushion for her wheelchair and she has a specialized mattress for sleep. She has restarted smoking and smokes about half a pack of cigarettes a day 02/18/2015 -- X-ray of the pelvis -- IMPRESSION:No acute osseous injury of the pelvis. If there is clinical concern regarding osteomyelitis, recommend further evaluation with CT. X-ray of the sacrum and coccyx IMPRESSION:No acute osseous abnormality of the sacrum. If there is further clinical concern regarding osteomyelitis of the sacrum, further evaluation with CT is recommended. ========= Patient History Information obtained from Patient. Family History Cancer - Mother, Diabetes - Siblings, Hypertension - Siblings, No family history of Heart Disease, Hereditary Spherocytosis, Kidney Disease, Lung Disease, Seizures, Stroke, Thyroid Problems, Tuberculosis. Social History Former smoker - ended on 03/09/2019, Marital Status - Separated, Alcohol Use - Never, Drug Use - No History, Caffeine Use - Never. Medical History Eyes Denies history of Cataracts, Glaucoma, Optic Neuritis Ear/Nose/Mouth/Throat Denies history of Chronic sinus problems/congestion, Middle ear problems Hematologic/Lymphatic Denies history of Anemia, Hemophilia, Human Immunodeficiency Virus, Lymphedema, Sickle Cell Disease Respiratory Patient has history of Asthma, Chronic Obstructive Pulmonary Disease (COPD) Cardiovascular Denies history of Angina, Arrhythmia, Congestive Heart Failure, Coronary Artery Disease, Deep Vein Thrombosis, Hypertension,  Hypotension, Myocardial Infarction, Peripheral Arterial Disease, Peripheral Venous Disease, Phlebitis, Vasculitis Gastrointestinal Denies history of Cirrhosis , Colitis, Crohn s, Hepatitis A, Hepatitis B, Hepatitis C Endocrine Denies history of Type I Diabetes, Type II Diabetes Genitourinary Denies history of End Stage Renal Disease Immunological Denies history of Lupus Erythematosus, Raynaud s, Scleroderma Integumentary (Skin) Patient has history of History of pressure wounds Denies history of History of Burn Musculoskeletal Denies history of Gout, Rheumatoid Arthritis, Osteoarthritis, Osteomyelitis Neurologic Polus, Charma M. ([id number]) Patient has history of Paraplegia - C 3 paraplegia Oncologic Denies history of Received Chemotherapy, Received Radiation Hospitalization/Surgery History - collapsed lung and lung congestion. Medical And Surgical History Notes Genitourinary urostomy Musculoskeletal right arm contracture Review of Systems (ROS) Constitutional Symptoms (General Health) Denies complaints or symptoms of Fatigue, Fever, Chills, Marked Weight Change. Respiratory Denies complaints or symptoms of Chronic or frequent coughs, Shortness of Breath. Cardiovascular Denies complaints or symptoms of Chest pain, LE edema. Psychiatric Denies complaints or symptoms of Anxiety, Claustrophobia. Objective Constitutional Thin and well-hydrated in no acute distress. Vitals Time Taken: 2:10 PM, Height: 63 in, Weight: 70 lbs, BMI: 12.4, Temperature: 98.3 F, Pulse: 79 bpm, Respiratory Rate: 16 breaths/min, Blood Pressure: 100/64 mmHg. Respiratory normal breathing without difficulty. clear to auscultation bilaterally. Cardiovascular regular rate and rhythm with normal S1, S2. Psychiatric this patient is able to make decisions and demonstrates good insight into disease process. Alert and Oriented x 3. pleasant and cooperative. General Notes: Upon evaluation patient's wounds  are not showing any signs of severe worsening although she does not have a lot of tissue overlying the bone and there is no bone exposure at this point obviously were trying to avoid that. I do believe the [facility] still appropriate for her since she does have quite a bit of hyper granulation still noted at this point. No fevers, chills, nausea, vomiting, or diarrhea. Integumentary (Hair, Skin) Wound #5 status is Open. Original cause of wound was Gradually Appeared. The wound is located on the Right Gluteus. The wound measures 3cm length x 2cm width x 1cm depth; 4.712cm^2 area and 4.712cm^3 volume. There is Fat Layer (Subcutaneous Tissue) Exposed exposed. There is no tunneling or undermining noted. There is a large amount of serous Betker, Ary M. ([id number]) drainage noted. The wound margin is flat and intact. There is medium (34-66%) red granulation within the wound bed. There is a medium (34-66%) amount of necrotic tissue within the wound bed including Eschar and Adherent Slough. Wound #6 status is Open. Original cause of wound was Pressure Injury. The wound is located on the Right Sacrum. The wound measures 5.5cm length x 7cm width x 0.5cm depth; 30.238cm^2 area and 15.119cm^3 volume. There is Fat Layer (Subcutaneous Tissue) Exposed exposed. There is no tunneling or undermining noted. There is a medium amount of serous drainage noted. The wound margin is flat and intact. There is medium (34-66%) red granulation within the wound bed. There is a medium (34-66%) amount of necrotic tissue within the wound bed including Eschar. Wound #7 status is Open. Original cause of wound was Gradually Appeared. The wound is located on the Right Trochanter. The wound measures 6cm length x 4cm width x 0.5cm depth; 18.85cm^2 area and 9.425cm^3 volume. There is Fat Layer (Subcutaneous Tissue) Exposed exposed. There is no tunneling noted, however, there is undermining starting at 11:00 and ending at 4:00  with a maximum distance of 2cm. There is a medium amount of serous drainage noted. The wound margin is flat and intact. There is medium (  34-66%) red granulation within the wound bed. There is a medium (34-66%) amount of necrotic tissue within the wound bed including Eschar. 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 May Shower, gently pat wound dry prior to applying new dressing. Wound #6 Right Sacrum: Clean wound with Normal Saline. May Shower, gently pat wound dry prior to applying new dressing. Wound #7 Right Trochanter: Clean wound with Normal Saline. May Shower, gently pat wound dry prior to applying new dressing. Primary Wound Dressing: Wound #5 Right Gluteus: Hydrafera Blue Ready Transfer Wound #6 Right Sacrum: Hydrafera Blue Ready Transfer Wound #7 Right Trochanter: Hydrafera Blue Ready Transfer Secondary Dressing: Wound #5 Right Gluteus: Boardered Foam Dressing Wound #6 Right Sacrum: [name], Trachelle M. ([id number]) Boardered Foam Dressing Wound #7 Right Trochanter: Boardered Foam Dressing Dressing Change Frequency: Wound #5 Right Gluteus: Change Dressing Monday, Wednesday, Friday - or three times weekly Wound #6 Right Sacrum: Change Dressing Monday, Wednesday, Friday - or three times weekly Wound #7 Right Trochanter: Change Dressing Monday, Wednesday, Friday - or three times weekly Follow-up Appointments: Return Appointment in 2 weeks. Off-Loading: Wound #5 Right Gluteus: Mattress - air mattress or fluidized air mattress Turn and reposition every 2 hours Wound #6 Right Sacrum: Mattress - air mattress or fluidized air mattress Turn and reposition every 2 hours Wound #7 Right Trochanter: Mattress - air  mattress or fluidized air mattress Turn and reposition every 2 hours Additional Orders / Instructions: Wound #5 Right Gluteus: Increase protein intake. Wound #6 Right Sacrum: Increase protein intake. Wound #7 Right Trochanter: Increase protein intake. Home Health: Wound #5 Right Gluteus: Continue Home Health Visits - [facility] - Please provide dressing materials for patient Home Health Nurse may visit PRN to address patient s wound care needs. FACE TO FACE ENCOUNTER: MEDICARE and MEDICAID PATIENTS: I certify that this patient is under my care and that I had a face-to-face encounter that meets the physician face-to-face encounter requirements with this patient on this date. The encounter with the patient was in whole or in part for the following MEDICAL CONDITION: (primary reason for Home Healthcare) MEDICAL NECESSITY: I certify, that based on my findings, NURSING services are a medically necessary home health service. HOME BOUND STATUS: I certify that my clinical findings support that this patient is homebound (i.e., Due to illness or injury, pt requires aid of supportive devices such as crutches, cane, wheelchairs, walkers, the use of special transportation or the assistance of another person to leave their place of residence. There is a normal inability to leave the home and doing so requires considerable and taxing effort. Other absences are for medical reasons / religious services and are infrequent or of short duration when for other reasons). If current dressing causes regression in wound condition, may D/C ordered dressing product/s and apply Normal Saline Moist Dressing daily until next Wound Healing Center / Other MD appointment. Notify Wound Healing Center of regression in wound condition at [phone]. Please direct any NON-WOUND related issues/requests for orders to patient's Primary Care Physician Wound #6 Right Sacrum: Continue Home Health Visits - [facility] - Please provide  dressing materials for patient Home Health Nurse may visit PRN to address patient s wound care needs. FACE TO FACE ENCOUNTER: MEDICARE and MEDICAID PATIENTS: I certify that this patient is under my care and  that I had a face-to-face encounter that meets the physician face-to-face encounter requirements with this patient on this date. The encounter with the patient was in whole or in part for the following MEDICAL CONDITION: (primary reason for [place]) MEDICAL NECESSITY: I certify, that based on my findings, NURSING services are a medically necessary home health service. HOME BOUND STATUS: I certify that my clinical findings support that this patient is homebound (i.e., Due to illness or injury, pt requires aid of supportive devices such as crutches, cane, wheelchairs, walkers, the use of special transportation or the assistance of another person to leave their place of residence. There is a normal inability to leave the home and doing so requires considerable and taxing effort. Other absences are for medical reasons / religious services and are infrequent or of short duration when for other reasons). If current dressing causes regression in wound condition, may D/C ordered dressing product/s and apply Normal Saline Moist Dressing daily until next [place] / Other MD appointment. [place] of regression in [place]. ([id number]) wound condition at [phone]. Please direct any NON-WOUND related issues/requests for orders to patient's Primary Care Physician Wound #7 Right Trochanter: [place] Visits - [facility] - Please provide dressing materials for patient Home Health Nurse may visit PRN to address patient s wound care needs. FACE TO FACE ENCOUNTER: MEDICARE and MEDICAID PATIENTS: I certify that this patient is under my care and that I had a face-to-face encounter that meets the physician face-to-face encounter requirements with this  patient on this date. The encounter with the patient was in whole or in part for the following MEDICAL CONDITION: (primary reason for [place]) MEDICAL NECESSITY: I certify, that based on my findings, NURSING services are a medically necessary home health service. HOME BOUND STATUS: I certify that my clinical findings support that this patient is homebound (i.e., Due to illness or injury, pt requires aid of supportive devices such as crutches, cane, wheelchairs, walkers, the use of special transportation or the assistance of another person to leave their place of residence. There is a normal inability to leave the home and doing so requires considerable and taxing effort. Other absences are for medical reasons / religious services and are infrequent or of short duration when for other reasons). If current dressing causes regression in wound condition, may D/C ordered dressing product/s and apply Normal Saline Moist Dressing daily until next [place] / Other MD appointment. [place] of regression in wound condition at [phone]. Please direct any NON-WOUND related issues/requests for orders to patient's Primary Care Physician 1. Recommend that we continue with the [facility] dressing as I do feel like that is doing well for her. 2. I am also going to suggest that we continue with the appropriate offloading I recommended no more than a 2-3-hour time in any 1 position without changing to another position. She voiced understanding as far as that was concerned as well. 3. I do think that if anything worsens we may need to look further into this as far as potentially attempting offloading more effectively and aggressively. 4. I think that she sounds like she would much prefer to have an air mattress I believe that that would be better for her than the [place] bed which she is not very happy with to be perfectly honest. With that being said as of right now  she still having a hard time getting this approved. The medical supply company  just is not coming out to make the switch that she needs them to do. We will see patient back for reevaluation in 1 week here in the clinic. If anything worsens or changes patient will contact our office for additional recommendations. Electronic Signature(s) Signed: 04/22/2019 5:56:18 PM By: Lenda Kelp PA-C Entered By: Lenda Kelp on 04/22/2019 17:56:18 Herdt, [name] ([id number]) -------------------------------------------------------------------------------- ROS/PFSH Details Patient Name: [name], [name] Date of Service: 04/22/2019 2:00 PM Medical Record Number: [id number] Patient Account Number: [id number] Date of Birth/Sex: April 21, 1966 (53 y.o. F) Treating RN: Curtis Sites Primary Care Provider: [place], JULIE Other Clinician: Referring Provider: [place], JULIE Treating Provider/Extender: STONE III, HOYT Weeks in Treatment: 2 Information Obtained From Patient Constitutional Symptoms (General Health) Complaints and Symptoms: Negative for: Fatigue; Fever; Chills; Marked Weight Change Respiratory Complaints and Symptoms: Negative for: Chronic or frequent coughs; Shortness of Breath Medical History: Positive for: Asthma; Chronic Obstructive Pulmonary Disease (COPD) Cardiovascular Complaints and Symptoms: Negative for: Chest pain; LE edema Medical History: Negative for: Angina; Arrhythmia; Congestive Heart Failure; Coronary Artery Disease; Deep Vein Thrombosis; Hypertension; Hypotension; Myocardial Infarction; Peripheral Arterial Disease; Peripheral Venous Disease; Phlebitis; Vasculitis Psychiatric Complaints and Symptoms: Negative for: Anxiety; Claustrophobia Eyes Medical History: Negative for: Cataracts; Glaucoma; Optic Neuritis Ear/Nose/Mouth/Throat Medical History: Negative for: Chronic sinus problems/congestion; Middle ear problems Hematologic/Lymphatic Medical  History: Negative for: Anemia; Hemophilia; Human Immunodeficiency Virus; Lymphedema; Sickle Cell Disease Gastrointestinal Medical History: Negative for: Cirrhosis ; Colitis; Crohnos; Hepatitis A; Hepatitis B; Hepatitis C Shull, Yumna M. ([id number]) Endocrine Medical History: Negative for: Type I Diabetes; Type II Diabetes Genitourinary Medical History: Negative for: End Stage Renal Disease Past Medical History Notes: urostomy Immunological Medical History: Negative for: Lupus Erythematosus; Raynaudos; Scleroderma Integumentary (Skin) Medical History: Positive for: History of pressure wounds Negative for: History of Burn Musculoskeletal Medical History: Negative for: Gout; Rheumatoid Arthritis; Osteoarthritis; Osteomyelitis Past Medical History Notes: right arm contracture Neurologic Medical History: Positive for: Paraplegia - C 3 paraplegia Oncologic Medical History: Negative for: Received Chemotherapy; Received Radiation Immunizations Pneumococcal Vaccine: Received Pneumococcal Vaccination: Yes Implantable Devices No devices added Hospitalization / Surgery History Type of Hospitalization/Surgery collapsed lung and lung congestion Family and Social History Cancer: Yes - Mother; Diabetes: Yes - Siblings; Heart Disease: No; Hereditary Spherocytosis: No; Hypertension: Yes - Siblings; Kidney Disease: No; Lung Disease: No; Seizures: No; Stroke: No; Thyroid Problems: No; Tuberculosis: No; Former smoker - ended on 03/09/2019; Marital Status - Separated; Alcohol Use: Never; Drug Use: No History; Caffeine Use: Never; Financial Concerns: No; Food, Clothing or Shelter Needs: No; Support System Lacking: No; Transportation Concerns: No Physician Affirmation I have reviewed and agree with the above information. GERIANN, LAFONT ([id number]) Electronic Signature(s) Signed: 04/22/2019 7:13:08 PM By: Lenda Kelp PA-C Signed: 04/23/2019 4:26:39 PM By: Curtis Sites Entered By:  Lenda Kelp on 04/22/2019 17:54:08 Ratliff, [name] ([id number]) -------------------------------------------------------------------------------- SuperBill Details Patient Name: [name], [name] Date of Service: 04/22/2019 Medical Record Number: [id number] Patient Account Number: [id number] Date of Birth/Sex: 1965/12/13 (53 y.o. 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 incomplete E44.0 Moderate protein-calorie malnutrition Facility Procedures CPT4 Code: 53664403 Description: 99214 - WOUND CARE VISIT-LEV 4 EST PT Modifier: Quantity: 1 Physician Procedures CPT4 Code: 4742595 Description: 63875 -  WC PHYS LEVEL 4 - EST PT ICD-10 Diagnosis Description L89.313 Pressure ulcer of right buttock, stage 3 L89.323 Pressure ulcer of left buttock, stage 3 L89.213 Pressure ulcer of right hip, stage 3 G82.52 Quadriplegia, C1-C4 incomplete Modifier: Quantity: 1 Electronic Signature(s) Signed: 04/22/2019 5:56:39 PM By: Lenda Kelp PA-C Entered By: Lenda Kelp on 04/22/2019 17:56:39

## 2019-05-08 ENCOUNTER — Encounter: Payer: Medicare Other | Attending: Physician Assistant | Admitting: Physician Assistant

## 2019-05-08 ENCOUNTER — Other Ambulatory Visit: Payer: Self-pay

## 2019-05-08 DIAGNOSIS — G8252 Quadriplegia, C1-C4 incomplete: Secondary | ICD-10-CM | POA: Diagnosis not present

## 2019-05-08 DIAGNOSIS — L89313 Pressure ulcer of right buttock, stage 3: Secondary | ICD-10-CM | POA: Insufficient documentation

## 2019-05-08 DIAGNOSIS — L89213 Pressure ulcer of right hip, stage 3: Secondary | ICD-10-CM | POA: Insufficient documentation

## 2019-05-08 DIAGNOSIS — J449 Chronic obstructive pulmonary disease, unspecified: Secondary | ICD-10-CM | POA: Diagnosis not present

## 2019-05-08 DIAGNOSIS — L89323 Pressure ulcer of left buttock, stage 3: Secondary | ICD-10-CM | POA: Diagnosis not present

## 2019-05-08 DIAGNOSIS — E440 Moderate protein-calorie malnutrition: Secondary | ICD-10-CM | POA: Diagnosis not present

## 2019-05-08 DIAGNOSIS — F17210 Nicotine dependence, cigarettes, uncomplicated: Secondary | ICD-10-CM | POA: Insufficient documentation

## 2019-05-08 NOTE — Progress Notes (Signed)
[name], [name] ([id number]) Visit Report for 05/08/2019 Arrival Information Details Patient Name: [name], [name]. Date of Service: 05/08/2019 12:30 PM Medical Record Number: [id number] Patient Account Number: [id number] Date of Birth/Sex: 06/23/1966 (53 y.o. F) Treating RN: Rodell Perna Primary Care [name]: [place], [place]ULIE Other Clinician: Referring [name]: [place], [place] Treating [name]/Extender: Linwood Dibbles, HOYT Weeks in Treatment: 4 Visit Information History Since Last Visit Added or deleted any medications: No Patient Arrived: Wheel Chair Any new allergies or adverse reactions: No Arrival Time: 12:45 Had a fall or experienced change in No activities of daily living that may affect Accompanied By: boyfriend risk of falls: Transfer Assistance: Manual Signs or symptoms of abuse/neglect since last visito No Patient Identification Verified: Yes Hospitalized since last visit: No Secondary Verification Process Completed: Yes Implantable device outside of the clinic excluding No Patient Requires Transmission-Based No cellular tissue based products placed in the center Precautions: since last visit: Patient Has Alerts: No Has Dressing in Place as Prescribed: Yes Pain Present Now: No Electronic Signature(s) Signed: 05/08/2019 4:57:32 PM By: [name] RCP, RRT, CHT Entered By: [name] on 05/08/2019 12:46:41 [name] -------------------------------------------------------------------------------- Clinic Level of Care Assessment Details Patient Name: [name], [name]. Date of Service: 05/08/2019 12:30 PM Medical Record Number: [id number] Patient Account Number: [id number] Date of Birth/Sex: 17-Mar-1966 (53 y.o. F) Treating RN: Rodell Perna Primary Care [name]: [place], [place]ULIE Other Clinician: Referring [name]: [place], [place] Treating [name]/Extender: Linwood Dibbles, HOYT Weeks in Treatment: 4 Clinic Level of Care  Assessment Items TOOL 4 Quantity Score  - Use when only an EandM is performed on FOLLOW-UP visit 0 ASSESSMENTS - Nursing Assessment / Reassessment X - Reassessment of Co-morbidities (includes updates in patient status) 1 10 X- 1 5 Reassessment of Adherence to Treatment Plan ASSESSMENTS - Wound and Skin Assessment / Reassessment  - Simple Wound Assessment / Reassessment - one wound 0 X- 3 5 Complex Wound Assessment / Reassessment - multiple wounds  - 0 Dermatologic / Skin Assessment (not related to wound area) ASSESSMENTS - Focused Assessment  - Circumferential Edema Measurements - multi extremities 0  - 0 Nutritional Assessment / Counseling / Intervention  - 0 Lower Extremity Assessment (monofilament, tuning fork, pulses)  - 0 Peripheral Arterial Disease Assessment (using hand held doppler) ASSESSMENTS - Ostomy and/or Continence Assessment and Care  - Incontinence Assessment and Management 0  - 0 Ostomy Care Assessment and Management (repouching, etc.) PROCESS - Coordination of Care X - Simple Patient / Family Education for ongoing care 1 15  - 0 Complex (extensive) Patient / Family Education for ongoing care X- 1 10 Staff obtains Chiropractor, Records, Test Results / Process Orders  - 0 Staff telephones HHA, Nursing Homes / Clarify orders / etc  - 0 Routine Transfer to another Facility (non-emergent condition)  - 0 Routine Hospital Admission (non-emergent condition)  - 0 New Admissions / Manufacturing engineer / Ordering NPWT, Apligraf, etc.  - 0 Emergency Hospital Admission (emergent condition) X- 1 10 Simple Discharge Coordination [name], [name]. ([id number])  - 0 Complex (extensive) Discharge Coordination PROCESS - Special Needs  - Pediatric / Minor Patient Management 0  - 0 Isolation Patient Management  - 0 Hearing / Language / Visual special needs  - 0 Assessment of Community assistance (transportation, D/C planning,  etc.)  - 0 Additional assistance / Altered mentation X- 1 15 Support Surface(s) Assessment (bed, cushion, seat, etc.) INTERVENTIONS - Wound Cleansing / Measurement  - Simple Wound Cleansing - one wound 0 X-  3 5 Complex Wound Cleansing - multiple wounds X- 1 5 Wound Imaging (photographs - any number of wounds)  - 0 Wound Tracing (instead of photographs)  - 0 Simple Wound Measurement - one wound X- 3 5 Complex Wound Measurement - multiple wounds INTERVENTIONS - Wound Dressings  - Small Wound Dressing one or multiple wounds 0 X- 3 15 Medium Wound Dressing one or multiple wounds  - 0 Large Wound Dressing one or multiple wounds  - 0 Application of Medications - topical  - 0 Application of Medications - injection INTERVENTIONS - Miscellaneous  - External ear exam 0  - 0 Specimen Collection (cultures, biopsies, blood, body fluids, etc.)  - 0 Specimen(s) / Culture(s) sent or taken to Lab for analysis  - 0 Patient Transfer (multiple staff / Nurse, adult / Similar devices)  - 0 Simple Staple / Suture removal (25 or less)  - 0 Complex Staple / Suture removal (26 or more)  - 0 Hypo / Hyperglycemic Management (close monitor of Blood Glucose)  - 0 Ankle / Brachial Index (ABI) - do not check if billed separately X- 1 5 Vital Signs [name], [name] ([id number]) Has the patient been seen at the hospital within the last three years: Yes Total Score: 165 Level Of Care: New/Established - Level 5 Electronic Signature(s) Signed: 05/08/2019 4:39:17 PM By: Rodell Perna Entered By: Rodell Perna on 05/08/2019 13:22:57 [name], [name] ([id number]) -------------------------------------------------------------------------------- Encounter Discharge Information Details Patient Name: [name], [name]. Date of Service: 05/08/2019 12:30 PM Medical Record Number: [id number] Patient Account Number: [id number] Date of Birth/Sex: 1965-10-31 (53 y.o. F) Treating RN:  Rodell Perna Primary Care Tylasia Fletchall: [place], [place]IE Other Clinician: Referring Gadge Hermiz: [place], [place] Treating Prentiss Polio/Extender: Linwood Dibbles, HOYT Weeks in Treatment: 4 Encounter Discharge Information Items Discharge Condition: Stable Ambulatory Status: Wheelchair Discharge Destination: Home Transportation: Private Auto Accompanied By: spouse Schedule Follow-up Appointment: Yes Clinical Summary of Care: Electronic Signature(s) Signed: 05/08/2019 4:39:17 PM By: Rodell Perna Entered By: Rodell Perna on 05/08/2019 13:23:55 Werth, Arcelia Judie Petit ([id number]) -------------------------------------------------------------------------------- Lower Extremity Assessment Details Patient Name: [name], [name]. Date of Service: 05/08/2019 12:30 PM Medical Record Number: [id number] Patient Account Number: [id number] Date of Birth/Sex: 11-Jun-1966 (53 y.o. F) Treating RN: Arnette Norris Primary Care Jasmon Graffam: [place], [place]ULIE Other Clinician: Referring Dodger Sinning: [place], [place] Treating Starsky Nanna/Extender: Linwood Dibbles, HOYT Weeks in Treatment: 4 Electronic Signature(s) Signed: 05/08/2019 4:15:06 PM By: Arnette Norris Entered By: Arnette Norris on 05/08/2019 13:04:19 Ziesmer, Lisaanne Judie Petit ([id number]) -------------------------------------------------------------------------------- Multi Wound Chart Details Patient Name: [name], [name]. Date of Service: 05/08/2019 12:30 PM Medical Record Number: [id number] Patient Account Number: [id number] Date of Birth/Sex: 16-May-1966 (53 y.o. F) Treating RN: Rodell Perna Primary Care Madhav Mohon: [place], [place]ULIE Other Clinician: Referring Byrd Terrero: [place], [place] Treating Aleksa Catterton/Extender: STONE III, HOYT Weeks in Treatment: 4 Vital Signs Height(in): 63 Pulse(bpm): 86 Weight(lbs): 70 Blood Pressure(mmHg): 83/59 Body Mass Index(BMI): 12 Temperature(F): 98.3 Respiratory Rate 16 (breaths/min): Photos: Wound Location: Right Gluteus Right Sacrum Right  Trochanter Wounding Event: Gradually Appeared Pressure Injury Gradually Appeared Primary Etiology: Pressure Ulcer Pressure Ulcer Pressure Ulcer Comorbid History: Asthma, Chronic Obstructive Asthma, Chronic Obstructive Asthma, Chronic Obstructive Pulmonary Disease (COPD), Pulmonary Disease (COPD), Pulmonary Disease (COPD), History of pressure wounds, History of pressure wounds, History of pressure wounds, Paraplegia Paraplegia Paraplegia Date Acquired: 10/16/2018 10/16/2018 04/02/2018 Weeks of Treatment: Wound Status: Open Open Open Clustered Wound: Yes No No Clustered Quantity: 3 N/A N/A Measurements L x W x D 5x5.5x0.2 5x11.5x0.1 5.5x4x0.4 (cm) Area (cm) : 21.598 45.16 17.279  Volume (cm) : 4.32 4.516 6.912 % Reduction in Area: 54.20% -38171.20% -10.00% % Reduction in Volume: 86.90% -37533.30% 51.10% Starting Position 1 12 (o'clock): Ending Position 1 4 (o'clock): Maximum Distance 1 (cm): 1 Starting Position 2 7 (o'clock): Ending Position 2 8 (o'clock): Maximum Distance 2 (cm): 0.7 Undermining: No No Yes IMAGENE, BOSS. ([id number]) Classification: Category/Stage III Category/Stage III Category/Stage III Exudate Amount: Large Medium Medium Exudate Type: Serous Serous Serous Exudate Color: amber amber amber Wound Margin: Flat and Intact Flat and Intact Flat and Intact Granulation Amount: Medium (34-66%) Medium (34-66%) Medium (34-66%) Granulation Quality: Red Red Red Necrotic Amount: Medium (34-66%) Medium (34-66%) Medium (34-66%) Necrotic Tissue: Eschar, Adherent Slough Eschar Eschar Exposed Structures: Fat Layer (Subcutaneous Fat Layer (Subcutaneous Fat Layer (Subcutaneous Tissue) Exposed: Yes Tissue) Exposed: Yes Tissue) Exposed: Yes Fascia: No Fascia: No Fascia: No Tendon: No Tendon: No Tendon: No Muscle: No Muscle: No Muscle: No Joint: No Joint: No Joint: No Bone: No Bone: No Bone: No Epithelialization: Small (1-33%) Small (1-33%) Small  (1-33%) Treatment Notes Electronic Signature(s) Signed: 05/08/2019 4:39:17 PM By: Army Melia Entered By: Army Melia on 05/08/2019 13:15:41 Szabo, [name] ([id number]) -------------------------------------------------------------------------------- [place] Details Patient Name: [name], [name]. Date of Service: 05/08/2019 12:30 PM Medical Record Number: [id number] Patient Account Number: [id number] Date of Birth/Sex: 1966/03/07 (53 y.o. F) Treating RN: Army Melia Primary Care Sorrel Cassetta: [place], JULIE Other Clinician: Referring Jisela Merlino: [place], [place] Treating Ayra Hodgdon/Extender: Melburn Hake, HOYT Weeks in Treatment: 4 Active Inactive Abuse / Safety / Falls / Self Care Management Nursing Diagnoses: Impaired physical mobility Goals: Patient will not develop complications from immobility Date Initiated: 04/08/2019 Target Resolution Date: 07/12/2019 Goal Status: Active Interventions: Assess fall risk on admission and as needed Notes: Nutrition Nursing Diagnoses: Potential for alteratiion in Nutrition/Potential for imbalanced nutrition Goals: Patient/caregiver agrees to and verbalizes understanding of need to use nutritional supplements and/or vitamins as prescribed Date Initiated: 04/08/2019 Target Resolution Date: 07/12/2019 Goal Status: Active Interventions: Assess patient nutrition upon admission and as needed per policy Notes: Orientation to the Wound Care Program Nursing Diagnoses: Knowledge deficit related to the wound healing center program Goals: Patient/caregiver will verbalize understanding of the [place] Program Date Initiated: 04/08/2019 Target Resolution Date: 07/12/2019 Goal Status: Active Interventions: Provide education on orientation to the wound center [name], [name]. ([id number]) Notes: Pressure Nursing Diagnoses: Knowledge deficit related to management of pressures ulcers Goals: Patient will remain free of pressure  ulcers Date Initiated: 04/08/2019 Target Resolution Date: 07/12/2019 Goal Status: Active Interventions: Assess potential for pressure ulcer upon admission and as needed Notes: Wound/Skin Impairment Nursing Diagnoses: Impaired tissue integrity Goals: Ulcer/skin breakdown will heal within 14 weeks Date Initiated: 04/08/2019 Target Resolution Date: 07/12/2019 Goal Status: Active Interventions: Assess patient/caregiver ability to obtain necessary supplies Assess patient/caregiver ability to perform ulcer/skin care regimen upon admission and as needed Assess ulceration(s) every visit Notes: Electronic Signature(s) Signed: 05/08/2019 4:39:17 PM By: Army Melia Entered By: Army Melia on 05/08/2019 13:15:31 White, Irelyn M. ([id number]) -------------------------------------------------------------------------------- Pain Assessment Details Patient Name: [name] Date of Service: 05/08/2019 12:30 PM Medical Record Number: [id number] Patient Account Number: [id number] Date of Birth/Sex: 1966-03-13 (53 y.o. F) Treating RN: Army Melia Primary Care Emanii Bugbee: [place], [place]ULIE Other Clinician: Referring Ruhi Kopke: [place], [place] Treating Tasmin Exantus/Extender: STONE III, HOYT Weeks in Treatment: 4 Active Problems Location of Pain Severity and Description of Pain Patient Has Paino No Site Locations Pain Management and Medication Current Pain Management: Electronic Signature(s) Signed: 05/08/2019 4:39:17 PM By: Army Melia Signed:  05/08/2019 4:57:32 PM By: [name] MartesWallace, RCP,RRT,CHT, Sallie RCP, RRT, CHT Entered By: [name] MartesWallace, RCP,RRT,CHT, Sallie on 05/08/2019 12:46:48 [name] MastersWADE, Lounell M. ([id number]) -------------------------------------------------------------------------------- Patient/Caregiver Education Details Patient Name: [name] MastersWADE, [name] Date of Service: 05/08/2019 12:30 PM Medical Record Number: [id number] Patient Account Number: [id number] Date of Birth/Gender: Jun 08, 1966 (53 y.o.  F) Treating RN: Rodell PernaScott, Dajea Primary Care Physician: [place], [place] Other Clinician: Referring Physician: [place], [place] Treating Physician/Extender: Skeet SimmerSTONE III, HOYT Weeks in Treatment: 4 Education Assessment Education Provided To: Patient Education Topics Provided Wound/Skin Impairment: Handouts: Caring for Your Ulcer Methods: Demonstration, Explain/Verbal Responses: State content correctly Electronic Signature(s) Signed: 05/08/2019 4:39:17 PM By: Rodell PernaScott, Dajea Entered By: Rodell PernaScott, Dajea on 05/08/2019 13:23:08 Enterline, [name] ([id number]) -------------------------------------------------------------------------------- Wound Assessment Details Patient Name: [name] MastersWADE, Modene M. Date of Service: 05/08/2019 12:30 PM Medical Record Number: [id number] Patient Account Number: [id number] Date of Birth/Sex: Jun 08, 1966 (53 y.o. F) Treating RN: Arnette NorrisBiell, Kristina Primary Care Nicodemus Denk: [place], [place]ULIE Other Clinician: Referring Fallou Hulbert: [place], [place]ULIE Treating Arvetta Araque/Extender: STONE III, HOYT Weeks in Treatment: 4 Wound Status Wound Number: 5 Primary Pressure Ulcer Etiology: Wound Location: Right Gluteus Wound Open Wounding Event: Gradually Appeared Status: Date Acquired: 10/16/2018 Comorbid Asthma, Chronic Obstructive Pulmonary Weeks Of Treatment: 4 History: Disease (COPD), History of pressure wounds, Clustered Wound: Yes Paraplegia Photos Wound Measurements Length: (cm) 5 % Reduction i Width: (cm) 5.5 % Reduction i Depth: (cm) 0.2 Epithelializa Clustered Quantity: 3 Tunneling: Area: (cm) 21.598 Undermining: Volume: (cm) 4.32 n Area: 54.2% n Volume: 86.9% tion: Small (1-33%) No No Wound Description Classification: Category/Stage III Foul Odor Aft Wound Margin: Flat and Intact Slough/Fibrin Exudate Amount: Large Exudate Type: Serous Exudate Color: amber er Cleansing: No o Yes Wound Bed Granulation Amount: Medium (34-66%) Exposed Structure Granulation Quality: Red Fascia  Exposed: No Necrotic Amount: Medium (34-66%) Fat Layer (Subcutaneous Tissue) Exposed: Yes Necrotic Quality: Eschar, Adherent Slough Tendon Exposed: No Muscle Exposed: No Joint Exposed: No Bone Exposed: No Masden, Avary M. ([id number]) Treatment Notes Wound #5 (Right Gluteus) Notes silver ag, BFD Electronic Signature(s) Signed: 05/08/2019 4:15:06 PM By: Arnette NorrisBiell, Kristina Entered By: Arnette NorrisBiell, Kristina on 05/08/2019 13:02:43 Saperstein, Bristyn M. ([id number]) -------------------------------------------------------------------------------- Wound Assessment Details Patient Name: [name] MastersWADE, [name] Date of Service: 05/08/2019 12:30 PM Medical Record Number: [id number] Patient Account Number: [id number] Date of Birth/Sex: Jun 08, 1966 (53 y.o. F) Treating RN: Arnette NorrisBiell, Kristina Primary Care Allona Gondek: [place], [place]UL[place] Other Clinician: Referring Marilouise Densmore: [place], [place]ULIE Treating Mardy Lucier/Extender: STONE III, HOYT Weeks in Treatment: 4 Wound Status Wound Number: 6 Primary Pressure Ulcer Etiology: Wound Location: Right Sacrum Wound Open Wounding Event: Pressure Injury Status: Date Acquired: 10/16/2018 Comorbid Asthma, Chronic Obstructive Pulmonary Weeks Of Treatment: 4 History: Disease (COPD), History of pressure wounds, Clustered Wound: No Paraplegia Photos Wound Measurements Length: (cm) 5 % Reduction in Width: (cm) 11.5 % Reduction in Depth: (cm) 0.1 Epithelializati Area: (cm) 45.16 Tunneling: Volume: (cm) 4.516 Undermining: Area: -38171.2% Volume: -37533.3% on: Small (1-33%) No No Wound Description Classification: Category/Stage III Foul Odor After Wound Margin: Flat and Intact Slough/Fibrino Exudate Amount: Medium Exudate Type: Serous Exudate Color: amber Cleansing: No No Wound Bed Granulation Amount: Medium (34-66%) Exposed Structure Granulation Quality: Red Fascia Exposed: No Necrotic Amount: Medium (34-66%) Fat Layer (Subcutaneous Tissue) Exposed: Yes Necrotic Quality:  Eschar Tendon Exposed: No Muscle Exposed: No Joint Exposed: No Bone Exposed: No Treatment Notes Petta, Genevie M. ([id number]) Wound #6 (Right Sacrum) Notes silver ag, BFD Electronic Signature(s) Signed: 05/08/2019 4:15:06 PM By: Arnette NorrisBiell, Kristina Entered By: Arnette NorrisBiell, Kristina on 05/08/2019 13:03:07 Umar, Klani M. ([id number]) -------------------------------------------------------------------------------- Wound Assessment Details Patient  Name: [name], [name]. Date of Service: 05/08/2019 12:30 PM Medical Record Number: [id number] Patient Account Number: [id number] Date of Birth/Sex: 23-Mar-1966 (53 y.o. F) Treating RN: Arnette Norris Primary Care Kaizer Dissinger: [place], [place]ULIE Other Clinician: Referring Alayzha An: [place], [place] Treating Adriona Kaney/Extender: STONE III, HOYT Weeks in Treatment: 4 Wound Status Wound Number: 7 Primary Pressure Ulcer Etiology: Wound Location: Right Trochanter Wound Open Wounding Event: Gradually Appeared Status: Date Acquired: 04/02/2018 Comorbid Asthma, Chronic Obstructive Pulmonary Weeks Of Treatment: 4 History: Disease (COPD), History of pressure wounds, Clustered Wound: No Paraplegia Photos Wound Measurements Length: (cm) 5.5 % Reduction in A Width: (cm) 4 % Reduction in V Depth: (cm) 0.4 Epithelializatio Area: (cm) 17.279 Tunneling: Volume: (cm) 6.912 Undermining: Location 1 Starting P Ending Pos Maximum Di Location 2 Starting P Ending Pos Maximum Di rea: -10% olume: 51.1% n: Small (1-33%) No Yes osition (o'clock): 12 ition (o'clock): 4 stance: (cm) 1 osition (o'clock): 7 ition (o'clock): 8 stance: (cm) 0.7 Wound Description Classification: Category/Stage III Foul Odor After Wound Margin: Flat and Intact Slough/Fibrino Exudate Amount: Medium Exudate Type: Serous Exudate Color: amber Cleansing: No No Wound Bed Granulation Amount: Medium (34-66%) Exposed Structure Mcneeley, Natally M. ([id number]) Granulation Quality:  Red Fascia Exposed: No Necrotic Amount: Medium (34-66%) Fat Layer (Subcutaneous Tissue) Exposed: Yes Necrotic Quality: Eschar Tendon Exposed: No Muscle Exposed: No Joint Exposed: No Bone Exposed: No Treatment Notes Wound #7 (Right Trochanter) Notes silver ag, BFD Electronic Signature(s) Signed: 05/08/2019 4:15:06 PM By: Arnette Norris Entered By: Arnette Norris on 05/08/2019 13:03:56 Hepner, Autymn Judie Petit ([id number]) -------------------------------------------------------------------------------- Vitals Details Patient Name: [name] Date of Service: 05/08/2019 12:30 PM Medical Record Number: [id number] Patient Account Number: [id number] Date of Birth/Sex: 31-Aug-1965 (53 y.o. F) Treating RN: Rodell Perna Primary Care Jaiah Weigel: [place], [place]ULIE Other Clinician: Referring Barak Bialecki: [place], [place] Treating Otis Burress/Extender: STONE III, HOYT Weeks in Treatment: 4 Vital Signs Time Taken: 12:45 Temperature (F): 98.3 Height (in): 63 Pulse (bpm): 86 Weight (lbs): 70 Respiratory Rate (breaths/min): 16 Body Mass Index (BMI): 12.4 Blood Pressure (mmHg): 83/59 Reference Range: 80 - 120 mg / dl Electronic Signature(s) Signed: 05/08/2019 4:57:32 PM By: [name] RCP, RRT, CHT Entered By: [name] on 05/08/2019 12:48:02

## 2019-05-08 NOTE — Progress Notes (Addendum)
PHILENA, OBEY ([id number]) Visit Report for 05/08/2019 Chief Complaint Document Details Patient Name: [name], [name] Date of Service: 05/08/2019 12:30 PM Medical Record Number: [id number] Patient Account Number: [id number] Date of Birth/Sex: 1965/09/23 (53 y.o. F) Treating RN: Rodell Perna Primary Care Provider: [place], JULIE Other Clinician: Referring Provider: [place], JULIE Treating Provider/Extender: Linwood Dibbles, HOYT Weeks in Treatment: 4 Information Obtained from: Patient Chief Complaint Bilateral gluteal and right hip pressure ulcers Electronic Signature(s) Signed: 05/08/2019 12:48:23 PM By: Lenda Kelp PA-C Entered By: Lenda Kelp on 05/08/2019 12:48:22 Boodram, [name] -------------------------------------------------------------------------------- HPI Details Patient Name: [name], [name] Date of Service: 05/08/2019 12:30 PM Medical Record Number: [id number] Patient Account Number: [id number] Date of Birth/Sex: 17-Jul-1965 (53 y.o. F) Treating RN: Rodell Perna Primary Care Provider: [place], JULIE Other Clinician: Referring Provider: [place], JULIE Treating Provider/Extender: Linwood Dibbles, HOYT Weeks in Treatment: 4 History of Present Illness HPI Description: 53 year old patient who was been an unreliable historian says she had completely healed in February and most recently her home health noticed that the wound on the right scapular area had reopened. She was in hospital about a month ago with pneumonia at that time a workup was done. We have no notes today but will workup the electronic medical record. on review of her records electronically I do not find any recent hospital MRI in the last 6 months. She had a CT scan of the chest in February 2018 which does not show any osseous lesions. she was admitted to the hospital between May 14 and May 17 for 3 days for mucus plugging and collapse of the left lung. at that time she had a decubitus ulcer of the  right scapular region stage III. her history was noted to have quadriplegia C5-C7 incomplete, neurogenic bladder, nephrostomy catheter and emphysema. patient tells me that she has not smoked for last month and a half but as per her hospital records she was smoking in the middle of May. 01/12/17 patient scapular region appears to be doing fairly well on evaluation today. She is having really no syndicate discomfort and has been tolerating the dressing changes well complication. Readmission: 04/08/2019 patient presents today for reevaluation in our clinic concerning issues she is having with wounds on the bilateral gluteal area as well as on the right trochanter. She has unfortunately noted that things seem to have gotten worse as she was using the Clinitron bed. The patient states it gives off a lot of heat and subsequently has not been good for her skin causing her to breakdown more. She has been trying to get back just to the air mattress but has not been able to get the company to come pick up that bed and bring out the air mattress. Her primary care provider is on this as well trying to get them to do so. Unfortunately her wounds are significantly large and show signs of hyper granulation as well. She has had this kind of issue for quite some time and unfortunately just does not seem to be showing signs of significant improvement with what she has been using currently. No fevers, chills, nausea, vomiting, or diarrhea. She does attempt appropriate offloading and again has been using the bed because that is all she has although she is not happy with it. 04/22/2019 on evaluation today patient appears to be doing about the same with regard to her wounds at this time. Fortunately there does not appear to be any signs of active infection at this point which  is good news. She has been tolerating the dressing changes without complication. Overall I am pleased with that with that being said unfortunately  she does have a little bit of more significant breakdown with regard to the wound bed upon evaluation today. I believe this is secondary to the fact that she has been lying potentially too much in a single position for too long over the past several weeks. I think that potentially if she were to mitigate this to a degree that things may improve somewhat. With that being said she is really not happy with her current bed she is attempted to have them come out and switch this back to an air mattress but unfortunately has not been successful in getting them to come out and make this change. She is very frustrated with that. She still has the Clinitron bed and she much prefers just having the standard air mattress. Patient's primary care provider still working on this she tells me. 05/08/2019 on evaluation today patient unfortunately is not doing as well as I would like to see in regard to her wounds. She actually is measuring a little bit larger pretty much at all wound locations unfortunately and overall I am very upset as well with the fact that she has been trying to get the Clinitron bed removed from her home but is not having any luck as such. We did get the number for the company today in order for us to see about calling them and switching this over to an air mattress as this is giving her a lot of trouble she states is really hot and she feels like it is breaking down her skin. This is not good. 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. For about 4-5 months she's had a decubitus ulcer on the right scapular region. she was lost to [name] MastersWADE, [name] ([id number]) follow-up for the last 6 months. she had a couple of small areas which have opened out in the region of previous scar tissue in the sacral region but these haveall healed now. She does not have any other  significant comorbidities. She has a good Roho cushion for her wheelchair and she has a specialized mattress for sleep. She has restarted smoking and smokes about half a pack of cigarettes a day 02/18/2015 -- X-ray of the pelvis -- IMPRESSION:No acute osseous injury of the pelvis. If there is clinical concern regarding osteomyelitis, recommend further evaluation with CT. X-ray of the sacrum and coccyx IMPRESSION:No acute osseous abnormality of the sacrum. If there is further clinical concern regarding osteomyelitis of the sacrum, further evaluation with CT is recommended. ========= Electronic Signature(s) Signed: 05/08/2019 1:42:12 PM By: Lenda KelpStone III, Hoyt PA-C Entered By: Lenda KelpStone III, Hoyt on 05/08/2019 13:42:11 Erskin, [name] ([id number]) -------------------------------------------------------------------------------- Physical Exam Details Patient Name: [name] MastersWADE, Claryce M. Date of Service: 05/08/2019 12:30 PM Medical Record Number: [id number] Patient Account Number: [id number] Date of Birth/Sex: 11/12/1965 (53 y.o. F) Treating RN: Rodell PernaScott, Dajea Primary Care Provider: [place], JULIE Other Clinician: Referring Provider: [place], JULIE Treating Provider/Extender: STONE III, HOYT Weeks in Treatment: 4 Constitutional Thin and well-hydrated in no acute distress. Respiratory normal breathing without difficulty. clear to auscultation bilaterally. Cardiovascular regular rate and rhythm with normal S1, S2. Psychiatric this patient is able to make decisions and demonstrates good insight into disease process. Alert and Oriented x 3. pleasant and cooperative. Notes At this point patient's  wounds currently do not require any sharp debridement but unfortunately she also has some additional breakdown compared to what we have seen before. This does have me worried about again appropriate offloading and I think that I am in agreement with her that the Clinitron bed does not seem to be doing as well for her  as we would like to have seen. I think that she may do better with a standard air mattress she feels like she did do much better when she had 1. Electronic Signature(s) Signed: 05/08/2019 1:43:08 PM By: Lenda Kelp PA-C Entered By: Lenda Kelp on 05/08/2019 13:43:08 Grossberg, [name] ([id number]) -------------------------------------------------------------------------------- Physician Orders Details Patient Name: KASHARI, CHALMERS. Date of Service: 05/08/2019 12:30 PM Medical Record Number: [id number] Patient Account Number: [id number] Date of Birth/Sex: Oct 25, 1965 (53 y.o. F) Treating RN: Rodell Perna Primary Care Provider: [place], JULIE Other Clinician: Referring Provider: [place], JULIE Treating Provider/Extender: Linwood Dibbles, HOYT Weeks in Treatment: 4 Verbal / Phone Orders: No Diagnosis Coding ICD-10 Coding Code Description L89.313 Pressure ulcer of right buttock, stage 3 L89.323 Pressure ulcer of left buttock, stage 3 L89.213 Pressure ulcer of right hip, stage 3 G82.52 Quadriplegia, C1-C4 incomplete E44.0 Moderate protein-calorie malnutrition Wound Cleansing Wound #5 Right Gluteus o Clean wound with Normal Saline. o May Shower, gently pat wound dry prior to applying new dressing. Wound #6 Right Sacrum o Clean wound with Normal Saline. o May Shower, gently pat wound dry prior to applying new dressing. Wound #7 Right Trochanter o Clean wound with Normal Saline. o May Shower, gently pat wound dry prior to applying new dressing. Primary Wound Dressing Wound #5 Right Gluteus o Silver Alginate - Silver cell specifically. Wet the dressing for removal during dressing change. Wound #6 Right Sacrum o Silver Alginate - Silver cell specifically. Wet the dressing for removal during dressing change. Wound #7 Right Trochanter o Silver Alginate - Silver cell specifically. Wet the dressing for removal during dressing change. Secondary Dressing Wound #5 Right  Gluteus o Boardered Foam Dressing Wound #6 Right Sacrum o Boardered Foam Dressing Wound #7 Right Trochanter o Boardered Foam Dressing Dressing Change Frequency [name], [name] ([id number]) Wound #5 Right Gluteus o Change Dressing Monday, Wednesday, Friday - or three times weekly Wound #6 Right Sacrum o Change Dressing Monday, Wednesday, Friday - or three times weekly Wound #7 Right Trochanter o Change Dressing Monday, Wednesday, Friday - or three times weekly Follow-up Appointments Wound #5 Right Gluteus o Return Appointment in 2 weeks. Wound #6 Right Sacrum o Return Appointment in 2 weeks. Wound #7 Right Trochanter o Return Appointment in 2 weeks. Off-Loading Wound #5 Right Gluteus o Mattress - air mattress or fluidized air mattress o Turn and reposition every 2 hours Wound #6 Right Sacrum o Mattress - air mattress or fluidized air mattress o Turn and reposition every 2 hours Wound #7 Right Trochanter o Mattress - air mattress or fluidized air mattress o Turn and reposition every 2 hours o Mattress - air mattress or fluidized air mattress o Turn and reposition every 2 hours Additional Orders / Instructions Wound #5 Right Gluteus o Increase protein intake. Wound #6 Right Sacrum o Increase protein intake. Wound #7 Right Trochanter o Increase protein intake. Home Health Wound #5 Right Gluteus o Continue Home Health Visits - [facility] - Please provide dressing materials for patient o Home Health Nurse may visit PRN to address patientos wound care needs. o FACE TO FACE ENCOUNTER: MEDICARE and MEDICAID PATIENTS: I certify that this patient is  under my care and that I had a face-to-face encounter that meets the physician face-to-face encounter requirements with this patient on this date. The encounter with the patient was in whole or in part for the following MEDICAL CONDITION: (primary reason for Home Healthcare) MEDICAL  NECESSITY: I certify, that based on my findings, NURSING services are a medically necessary home health service. HOME BOUND STATUS: I certify that my clinical findings support that this patient is homebound (i.e., Due to illness or injury, pt requires aid of supportive devices such as crutches, cane, wheelchairs, walkers, the use of special transportation or the assistance of another person to leave their place of residence. There is a normal inability to leave the home [name], [name] ([id number]) and doing so requires considerable and taxing effort. Other absences are for medical reasons / religious services and are infrequent or of short duration when for other reasons). o If current dressing causes regression in wound condition, may D/C ordered dressing product/s and apply Normal Saline Moist Dressing daily until next Wound Healing Center / Other MD appointment. Notify Wound Healing Center of regression in wound condition at [phone]. o Please direct any NON-WOUND related issues/requests for orders to patient's Primary Care Physician Wound #6 Right Sacrum o Continue Home Health Visits - [facility] - Please provide dressing materials for patient o Home Health Nurse may visit PRN to address patientos wound care needs. o FACE TO FACE ENCOUNTER: MEDICARE and MEDICAID PATIENTS: I certify that this patient is under my care and that I had a face-to-face encounter that meets the physician face-to-face encounter requirements with this patient on this date. The encounter with the patient was in whole or in part for the following MEDICAL CONDITION: (primary reason for Home Healthcare) MEDICAL NECESSITY: I certify, that based on my findings, NURSING services are a medically necessary home health service. HOME BOUND STATUS: I certify that my clinical findings support that this patient is homebound (i.e., Due to illness or injury, pt requires aid of supportive devices such as crutches, cane,  wheelchairs, walkers, the use of special transportation or the assistance of another person to leave their place of residence. There is a normal inability to leave the home and doing so requires considerable and taxing effort. Other absences are for medical reasons / religious services and are infrequent or of short duration when for other reasons). o If current dressing causes regression in wound condition, may D/C ordered dressing product/s and apply Normal Saline Moist Dressing daily until next Wound Healing Center / Other MD appointment. Notify Wound Healing Center of regression in wound condition at [phone]. o Please direct any NON-WOUND related issues/requests for orders to patient's Primary Care Physician Wound #7 Right Trochanter o Continue Home Health Visits - [facility] - Please provide dressing materials for patient o Home Health Nurse may visit PRN to address patientos wound care needs. o FACE TO FACE ENCOUNTER: MEDICARE and MEDICAID PATIENTS: I certify that this patient is under my care and that I had a face-to-face encounter that meets the physician face-to-face encounter requirements with this patient on this date. The encounter with the patient was in whole or in part for the following MEDICAL CONDITION: (primary reason for Home Healthcare) MEDICAL NECESSITY: I certify, that based on my findings, NURSING services are a medically necessary home health service. HOME BOUND STATUS: I certify that my clinical findings support that this patient is homebound (i.e., Due to illness or injury, pt requires aid of supportive devices such as crutches, cane, wheelchairs,  walkers, the use of special transportation or the assistance of another person to leave their place of residence. There is a normal inability to leave the home and doing so requires considerable and taxing effort. Other absences are for medical reasons / religious services and are infrequent or of short duration  when for other reasons). o If current dressing causes regression in wound condition, may D/C ordered dressing product/s and apply Normal Saline Moist Dressing daily until next Wound Healing Center / Other MD appointment. Notify Wound Healing Center of regression in wound condition at [phone]. o Please direct any NON-WOUND related issues/requests for orders to patient's Primary Care Physician Electronic Signature(s) Signed: 05/08/2019 4:39:17 PM By: Rodell Perna Signed: 05/08/2019 6:06:55 PM By: Lenda Kelp PA-C Entered By: Rodell Perna on 05/08/2019 13:21:50 [name], [name] ([id number]) -------------------------------------------------------------------------------- Problem List Details Patient Name: ANDERSYN, FRAGOSO. Date of Service: 05/08/2019 12:30 PM Medical Record Number: [id number] Patient Account Number: [id number] Date of Birth/Sex: [birth year] (53 y.o. F) Treating RN: Rodell Perna Primary Care Provider: [place], JULIE Other Clinician: Referring Provider: [place], JULIE Treating Provider/Extender: Linwood Dibbles, HOYT Weeks in Treatment: 4 Active Problems ICD-10 Evaluated Encounter Code Description Active Date Today Diagnosis L89.313 Pressure ulcer of right buttock, stage 3 04/08/2019 No Yes L89.323 Pressure ulcer of left buttock, stage 3 04/08/2019 No Yes L89.213 Pressure ulcer of right hip, stage 3 04/08/2019 No Yes G82.52 Quadriplegia, C1-C4 incomplete 04/08/2019 No Yes E44.0 Moderate protein-calorie malnutrition 04/08/2019 No Yes Inactive Problems Resolved Problems Electronic Signature(s) Signed: 05/08/2019 12:48:17 PM By: Lenda Kelp PA-C Entered By: Lenda Kelp on 05/08/2019 12:48:17 [place], [name] ([id number]) -------------------------------------------------------------------------------- Progress Note Details Patient Name: [name] Date of Service: 05/08/2019 12:30 PM Medical Record Number: [id number] Patient Account Number: [id number] Date  of Birth/Sex: 1965/07/31 (53 y.o. F) Treating RN: Rodell Perna Primary Care Provider: [place], JULIE Other Clinician: Referring Provider: [place], JULIE Treating Provider/Extender: Linwood Dibbles, HOYT Weeks in Treatment: 4 Subjective Chief Complaint Information obtained from Patient Bilateral gluteal and right hip pressure ulcers History of Present Illness (HPI) 53 year old patient who was been an unreliable historian says she had completely healed in February and most recently her home health noticed that the wound on the right scapular area had reopened. She was in hospital about a month ago with pneumonia at that time a workup was done. We have no notes today but will workup the electronic medical record. on review of her records electronically I do not find any recent hospital MRI in the last 6 months. She had a CT scan of the chest in February 2018 which does not show any osseous lesions. she was admitted to the hospital between May 14 and May 17 for 3 days for mucus plugging and collapse of the left lung. at that time she had a decubitus ulcer of the right scapular region stage III. her history was noted to have quadriplegia C5-C7 incomplete, neurogenic bladder, nephrostomy catheter and emphysema. patient tells me that she has not smoked for last month and a half but as per her hospital records she was smoking in the middle of May. 01/12/17 patient scapular region appears to be doing fairly well on evaluation today. She is having really no syndicate discomfort and has been tolerating the dressing changes well complication. Readmission: 04/08/2019 patient presents today for reevaluation in our clinic concerning issues she is having with wounds on the bilateral gluteal area as well as on the right trochanter. She has unfortunately noted that things seem to have  gotten worse as she was using the Clinitron bed. The patient states it gives off a lot of heat and subsequently has not been good for  her skin causing her to breakdown more. She has been trying to get back just to the air mattress but has not been able to get the company to come pick up that bed and bring out the air mattress. Her primary care provider is on this as well trying to get them to do so. Unfortunately her wounds are significantly large and show signs of hyper granulation as well. She has had this kind of issue for quite some time and unfortunately just does not seem to be showing signs of significant improvement with what she has been using currently. No fevers, chills, nausea, vomiting, or diarrhea. She does attempt appropriate offloading and again has been using the bed because that is all she has although she is not happy with it. 04/22/2019 on evaluation today patient appears to be doing about the same with regard to her wounds at this time. Fortunately there does not appear to be any signs of active infection at this point which is good news. She has been tolerating the dressing changes without complication. Overall I am pleased with that with that being said unfortunately she does have a little bit of more significant breakdown with regard to the wound bed upon evaluation today. I believe this is secondary to the fact that she has been lying potentially too much in a single position for too long over the past several weeks. I think that potentially if she were to mitigate this to a degree that things may improve somewhat. With that being said she is really not happy with her current bed she is attempted to have them come out and switch this back to an air mattress but unfortunately has not been successful in getting them to come out and make this change. She is very frustrated with that. She still has the Clinitron bed and she much prefers just having the standard air mattress. Patient's primary care provider still working on this she tells me. 05/08/2019 on evaluation today patient unfortunately is not doing as  well as I would like to see in regard to her wounds. She actually is measuring a little bit larger pretty much at all wound locations unfortunately and overall I am very upset as well with the fact that she has been trying to get the Clinitron bed removed from her home but is not having any luck as such. We did get the number for the company today in order for [place] to see about calling them and switching this over to an air mattress as Birdsall, Xitlally M. ([id number]) this is giving her a lot of trouble she states is really hot and she feels like it is breaking down her skin. This is not good. 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 For about 4-5 months she's had a decubitus ulcer on the right scapular region. she was lost to follow-up for the last 6 months. she had a couple of small areas which have opened out in the region of previous scar tissue in the sacral region but these haveall healed now. She does not have any other significant comorbidities. She has a good Roho cushion for her wheelchair and she has a specialized mattress for sleep. She has restarted smoking and smokes about  half a pack of cigarettes a day 02/18/2015 -- X-ray of the pelvis -- IMPRESSION:No acute osseous injury of the pelvis. If there is clinical concern regarding osteomyelitis, recommend further evaluation with CT. X-ray of the sacrum and coccyx IMPRESSION:No acute osseous abnormality of the sacrum. If there is further clinical concern regarding osteomyelitis of the sacrum, further evaluation with CT is recommended. ========= Patient History Information obtained from Patient. Family History Cancer - Mother, Diabetes - Siblings, Hypertension - Siblings, No family history of Heart Disease, Hereditary Spherocytosis, Kidney Disease, Lung Disease, Seizures, Stroke, Thyroid Problems,  Tuberculosis. Social History Former smoker - ended on 03/09/2019, Marital Status - Separated, Alcohol Use - Never, Drug Use - No History, Caffeine Use - Never. Medical History Eyes Denies history of Cataracts, Glaucoma, Optic Neuritis Ear/Nose/Mouth/Throat Denies history of Chronic sinus problems/congestion, Middle ear problems Hematologic/Lymphatic Denies history of Anemia, Hemophilia, Human Immunodeficiency Virus, Lymphedema, Sickle Cell Disease Respiratory Patient has history of Asthma, Chronic Obstructive Pulmonary Disease (COPD) Cardiovascular Denies history of Angina, Arrhythmia, Congestive Heart Failure, Coronary Artery Disease, Deep Vein Thrombosis, Hypertension, Hypotension, Myocardial Infarction, Peripheral Arterial Disease, Peripheral Venous Disease, Phlebitis, Vasculitis Gastrointestinal Denies history of Cirrhosis , Colitis, Crohn s, Hepatitis A, Hepatitis B, Hepatitis C Endocrine Denies history of Type I Diabetes, Type II Diabetes Genitourinary Denies history of End Stage Renal Disease Immunological Denies history of Lupus Erythematosus, Raynaud s, Scleroderma Integumentary (Skin) Patient has history of History of pressure wounds Denies history of History of Burn Musculoskeletal Denies history of Gout, Rheumatoid Arthritis, Osteoarthritis, Osteomyelitis Neurologic Patient has history of Paraplegia - C 3 paraplegia Oncologic Denies history of Received Chemotherapy, Received Radiation [name], [name] ([id number]) Hospitalization/Surgery History - collapsed lung and lung congestion. Medical And Surgical History Notes Genitourinary urostomy Musculoskeletal right arm contracture Review of Systems (ROS) Constitutional Symptoms (General Health) Denies complaints or symptoms of Fatigue, Fever, Chills, Marked Weight Change. Respiratory Denies complaints or symptoms of Chronic or frequent coughs, Shortness of Breath. Cardiovascular Denies complaints or symptoms of  Chest pain, LE edema. Psychiatric Denies complaints or symptoms of Anxiety, Claustrophobia. Objective Constitutional Thin and well-hydrated in no acute distress. Vitals Time Taken: 12:45 PM, Height: 63 in, Weight: 70 lbs, BMI: 12.4, Temperature: 98.3 F, Pulse: 86 bpm, Respiratory Rate: 16 breaths/min, Blood Pressure: 83/59 mmHg. Respiratory normal breathing without difficulty. clear to auscultation bilaterally. Cardiovascular regular rate and rhythm with normal S1, S2. Psychiatric this patient is able to make decisions and demonstrates good insight into disease process. Alert and Oriented x 3. pleasant and cooperative. General Notes: At this point patient's wounds currently do not require any sharp debridement but unfortunately she also has some additional breakdown compared to what we have seen before. This does have me worried about again appropriate offloading and I think that I am in agreement with her that the Clinitron bed does not seem to be doing as well for her as we would like to have seen. I think that she may do better with a standard air mattress she feels like she did do much better when she had 1. Integumentary (Hair, Skin) Wound #5 status is Open. Original cause of wound was Gradually Appeared. The wound is located on the Right Gluteus. The wound measures 5cm length x 5.5cm width x 0.2cm depth; 21.598cm^2 area and 4.32cm^3 volume. There is Fat Layer (Subcutaneous Tissue) Exposed exposed. There is no tunneling or undermining noted. There is a large amount of serous drainage noted. The wound margin is flat and intact. There is medium (34-66%) red granulation  within the wound bed. There is a medium (34-66%) amount of necrotic tissue within the wound bed including Eschar and Adherent Slough. [name], [name] ([id number]) Wound #6 status is Open. Original cause of wound was Pressure Injury. The wound is located on the Right Sacrum. The wound measures 5cm length x 11.5cm  width x 0.1cm depth; 45.16cm^2 area and 4.516cm^3 volume. There is Fat Layer (Subcutaneous Tissue) Exposed exposed. There is no tunneling or undermining noted. There is a medium amount of serous drainage noted. The wound margin is flat and intact. There is medium (34-66%) red granulation within the wound bed. There is a medium (34-66%) amount of necrotic tissue within the wound bed including Eschar. Wound #7 status is Open. Original cause of wound was Gradually Appeared. The wound is located on the Right Trochanter. The wound measures 5.5cm length x 4cm width x 0.4cm depth; 17.279cm^2 area and 6.912cm^3 volume. There is Fat Layer (Subcutaneous Tissue) Exposed exposed. There is no tunneling noted, however, there is undermining starting at 12:00 and ending at 4:00 with a maximum distance of 1cm. There is additional undermining and at 7:00 and ending at 8:00 with a maximum distance of 0.7cm. There is a medium amount of serous drainage noted. The wound margin is flat and intact. There is medium (34-66%) red granulation within the wound bed. There is a medium (34-66%) amount of necrotic tissue within the wound bed including Eschar. 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 May Shower, gently pat wound dry prior to applying new dressing. Wound #6 Right Sacrum: Clean wound with Normal Saline. May Shower, gently pat wound dry prior to applying new dressing. Wound #7 Right Trochanter: Clean wound with Normal Saline. May Shower, gently pat wound dry prior to applying new dressing. Primary Wound Dressing: Wound #5 Right Gluteus: Silver Alginate - Silver cell specifically. Wet the dressing for removal during dressing change. Wound #6 Right  Sacrum: Silver Alginate - Silver cell specifically. Wet the dressing for removal during dressing change. Wound #7 Right Trochanter: Silver Alginate - Silver cell specifically. Wet the dressing for removal during dressing change. Secondary Dressing: Wound #5 Right Gluteus: Boardered Foam Dressing Wound #6 Right Sacrum: Boardered Foam Dressing Wound #7 Right Trochanter: [name], [name] ([id number]) Boardered Foam Dressing Dressing Change Frequency: Wound #5 Right Gluteus: Change Dressing Monday, Wednesday, Friday - or three times weekly Wound #6 Right Sacrum: Change Dressing Monday, Wednesday, Friday - or three times weekly Wound #7 Right Trochanter: Change Dressing Monday, Wednesday, Friday - or three times weekly Follow-up Appointments: Wound #5 Right Gluteus: Return Appointment in 2 weeks. Wound #6 Right Sacrum: Return Appointment in 2 weeks. Wound #7 Right Trochanter: Return Appointment in 2 weeks. Off-Loading: Wound #5 Right Gluteus: Mattress - air mattress or fluidized air mattress Turn and reposition every 2 hours Wound #6 Right Sacrum: Mattress - air mattress or fluidized air mattress Turn and reposition every 2 hours Wound #7 Right Trochanter: Mattress - air mattress or fluidized air mattress Turn and reposition every 2 hours Mattress - air mattress or fluidized air mattress Turn and reposition every 2 hours Additional Orders / Instructions: Wound #5 Right Gluteus: Increase protein intake. Wound #6 Right Sacrum: Increase protein intake. Wound #7 Right Trochanter: Increase protein intake. Home Health: Wound #5 Right Gluteus: Continue Home Health Visits -  [facility] - Please provide dressing materials for patient Home Health Nurse may visit PRN to address patient s wound care needs. FACE TO FACE ENCOUNTER: MEDICARE and MEDICAID PATIENTS: I certify that this patient is under my care and that I had a face-to-face encounter that meets the physician face-to-face  encounter requirements with this patient on this date. The encounter with the patient was in whole or in part for the following MEDICAL CONDITION: (primary reason for Home Healthcare) MEDICAL NECESSITY: I certify, that based on my findings, NURSING services are a medically necessary home health service. HOME BOUND STATUS: I certify that my clinical findings support that this patient is homebound (i.e., Due to illness or injury, pt requires aid of supportive devices such as crutches, cane, wheelchairs, walkers, the use of special transportation or the assistance of another person to leave their place of residence. There is a normal inability to leave the home and doing so requires considerable and taxing effort. Other absences are for medical reasons / religious services and are infrequent or of short duration when for other reasons). If current dressing causes regression in wound condition, may D/C ordered dressing product/s and apply Normal Saline Moist Dressing daily until next Wound Healing Center / Other MD appointment. Notify Wound Healing Center of regression in wound condition at [phone]. Please direct any NON-WOUND related issues/requests for orders to patient's Primary Care Physician Wound #6 Right Sacrum: Continue Home Health Visits - [facility] - Please provide dressing materials for patient Home Health Nurse may visit PRN to address patient s wound care needs. FACE TO FACE ENCOUNTER: MEDICARE and MEDICAID PATIENTS: I certify that this patient is under my care and that I had a face-to-face encounter that meets the physician face-to-face encounter requirements with this patient on this date. The encounter with the patient was in whole or in part for the following MEDICAL CONDITION: (primary reason for Home Healthcare) MEDICAL NECESSITY: I certify, that based on my findings, NURSING services are a medically necessary home health service. HOME BOUND STATUS: I certify that my clinical  findings support that this patient is homebound (i.e., Due to illness or injury, pt requires aid of supportive devices such as crutches, cane, wheelchairs, walkers, the use of special Alred, Aqueelah M. ([id number]) transportation or the assistance of another person to leave their place of residence. There is a normal inability to leave the home and doing so requires considerable and taxing effort. Other absences are for medical reasons / religious services and are infrequent or of short duration when for other reasons). If current dressing causes regression in wound condition, may D/C ordered dressing product/s and apply Normal Saline Moist Dressing daily until next Wound Healing Center / Other MD appointment. Notify Wound Healing Center of regression in wound condition at [phone]. Please direct any NON-WOUND related issues/requests for orders to patient's Primary Care Physician Wound #7 Right Trochanter: Continue Home Health Visits - [facility] - Please provide dressing materials for patient Home Health Nurse may visit PRN to address patient s wound care needs. FACE TO FACE ENCOUNTER: MEDICARE and MEDICAID PATIENTS: I certify that this patient is under my care and that I had a face-to-face encounter that meets the physician face-to-face encounter requirements with this patient on this date. The encounter with the patient was in whole or in part for the following MEDICAL CONDITION: (primary reason for Home Healthcare) MEDICAL NECESSITY: I certify, that based on my findings, NURSING services are a medically necessary home health service. HOME BOUND STATUS: I certify  that my clinical findings support that this patient is homebound (i.e., Due to illness or injury, pt requires aid of supportive devices such as crutches, cane, wheelchairs, walkers, the use of special transportation or the assistance of another person to leave their place of residence. There is a normal inability to leave the home  and doing so requires considerable and taxing effort. Other absences are for medical reasons / religious services and are infrequent or of short duration when for other reasons). If current dressing causes regression in wound condition, may D/C ordered dressing product/s and apply Normal Saline Moist Dressing daily until next Wound Healing Center / Other MD appointment. Notify Wound Healing Center of regression in wound condition at [phone]. Please direct any NON-WOUND related issues/requests for orders to patient's Primary Care Physician 1 I would recommend that we go ahead and see about get in touch with her company regarding the air mattress in order to see if we get the Clinitron bed switched out for a traditional alternating air mattress. 2. I am getting suggest as well that we discontinue the current dressings and utilize a silver alginate dressing specifically silver cell in order to prevent this from sticking I think that may be causing her some issues at this point as well. 3. I am also get a recommend appropriate offloading I really do not want her sitting on her gluteal region in general longer than 30 minutes at a time so basically at mealtimes may be a couple times in between but definitely I think she is better off to be in the bed and offloading on one side or the other as far as her wounds are concerned. We will see patient back for reevaluation in 1 week here in the clinic. If anything worsens or changes patient will contact our office for additional recommendations. Electronic Signature(s) Signed: 05/08/2019 1:44:18 PM By: Lenda Kelp PA-C Entered By: Lenda Kelp on 05/08/2019 13:44:18 [name], [name] ([id number]) -------------------------------------------------------------------------------- ROS/PFSH Details Patient Name: [name], [name] Date of Service: 05/08/2019 12:30 PM Medical Record Number: [id number] Patient Account Number: [id number] Date of  Birth/Sex: 09/24/1965 (52 y.o. F) Treating RN: Rodell Perna Primary Care Provider: [place], JULIE Other Clinician: Referring Provider: [place], JULIE Treating Provider/Extender: STONE III, HOYT Weeks in Treatment: 4 Information Obtained From Patient Constitutional Symptoms (General Health) Complaints and Symptoms: Negative for: Fatigue; Fever; Chills; Marked Weight Change Respiratory Complaints and Symptoms: Negative for: Chronic or frequent coughs; Shortness of Breath Medical History: Positive for: Asthma; Chronic Obstructive Pulmonary Disease (COPD) Cardiovascular Complaints and Symptoms: Negative for: Chest pain; LE edema Medical History: Negative for: Angina; Arrhythmia; Congestive Heart Failure; Coronary Artery Disease; Deep Vein Thrombosis; Hypertension; Hypotension; Myocardial Infarction; Peripheral Arterial Disease; Peripheral Venous Disease; Phlebitis; Vasculitis Psychiatric Complaints and Symptoms: Negative for: Anxiety; Claustrophobia Eyes Medical History: Negative for: Cataracts; Glaucoma; Optic Neuritis Ear/Nose/Mouth/Throat Medical History: Negative for: Chronic sinus problems/congestion; Middle ear problems Hematologic/Lymphatic Medical History: Negative for: Anemia; Hemophilia; Human Immunodeficiency Virus; Lymphedema; Sickle Cell Disease Gastrointestinal Medical History: Negative for: Cirrhosis ; Colitis; Crohnos; Hepatitis A; Hepatitis B; Hepatitis C [name], [name] ([id number]) Endocrine Medical History: Negative for: Type I Diabetes; Type II Diabetes Genitourinary Medical History: Negative for: End Stage Renal Disease Past Medical History Notes: urostomy Immunological Medical History: Negative for: Lupus Erythematosus; Raynaudos; Scleroderma Integumentary (Skin) Medical History: Positive for: History of pressure wounds Negative for: History of Burn Musculoskeletal Medical History: Negative for: Gout; Rheumatoid Arthritis; Osteoarthritis;  Osteomyelitis Past Medical History Notes: right arm contracture Neurologic Medical History:  Positive for: Paraplegia - C 3 paraplegia Oncologic Medical History: Negative for: Received Chemotherapy; Received Radiation Immunizations Pneumococcal Vaccine: Received Pneumococcal Vaccination: Yes Implantable Devices No devices added Hospitalization / Surgery History Type of Hospitalization/Surgery collapsed lung and lung congestion Family and Social History Cancer: Yes - Mother; Diabetes: Yes - Siblings; Heart Disease: No; Hereditary Spherocytosis: No; Hypertension: Yes - Siblings; Kidney Disease: No; Lung Disease: No; Seizures: No; Stroke: No; Thyroid Problems: No; Tuberculosis: No; Former smoker - ended on 03/09/2019; Marital Status - Separated; Alcohol Use: Never; Drug Use: No History; Caffeine Use: Never; Financial Concerns: No; Food, Clothing or Shelter Needs: No; Support System Lacking: No; Transportation Concerns: No Physician Affirmation I have reviewed and agree with the above information. [name], CHAMPA ([id number]) Electronic Signature(s) Signed: 05/08/2019 4:39:17 PM By: Rodell Perna Signed: 05/08/2019 6:06:55 PM By: Lenda Kelp PA-C Entered By: Lenda Kelp on 05/08/2019 13:42:31 [place], [name] ([id number]) -------------------------------------------------------------------------------- SuperBill Details Patient Name: MAYLINE, DRAGON. Date of Service: 05/08/2019 Medical Record Number: [id number] Patient Account Number: [id number] Date of Birth/Sex: 20-Sep-1965 (53 y.o. F) Treating RN: Rodell Perna Primary Care Provider: [place], JULIE Other Clinician: Referring Provider: [place], JULIE Treating Provider/Extender: Linwood Dibbles, HOYT Weeks in Treatment: 4 Diagnosis Coding ICD-10 Codes Code Description L89.313 Pressure ulcer of right buttock, stage 3 L89.323 Pressure ulcer of left buttock, stage 3 L89.213 Pressure ulcer of right hip, stage 3 G82.52 Quadriplegia,  C1-C4 incomplete E44.0 Moderate protein-calorie malnutrition Physician Procedures CPT4 Code: [id number] Description: 99214 - WC PHYS LEVEL 4 - EST PT ICD-10 Diagnosis Description L89.313 Pressure ulcer of right buttock, stage 3 L89.323 Pressure ulcer of left buttock, stage 3 L89.213 Pressure ulcer of right hip, stage 3 G82.52 Quadriplegia, C1-C4 incomplete Modifier: Quantity: 1 Electronic Signature(s) Signed: 05/08/2019 1:44:51 PM By: Lenda Kelp PA-C Entered By: Lenda Kelp on 05/08/2019 13:44:50

## 2019-05-22 ENCOUNTER — Encounter: Payer: Medicare Other | Admitting: Physician Assistant

## 2019-05-22 ENCOUNTER — Other Ambulatory Visit: Payer: Self-pay

## 2019-05-22 DIAGNOSIS — L89313 Pressure ulcer of right buttock, stage 3: Secondary | ICD-10-CM | POA: Diagnosis not present

## 2019-05-22 NOTE — Progress Notes (Addendum)
[name], Adelle M. ([id number]) Visit Report for 05/22/2019 Chief Complaint Document Details Patient Name: [name], [name] Date of Service: 05/22/2019 1:45 PM Medical Record Number: [id number] Patient Account Number: [id number] Date of Birth/Sex: [birth year] (53 y.o. F) Treating RN: Rodell PernaScott, Dajea Primary Care Provider: [place], JULIE Other Clinician: Referring Provider: [place], JULIE Treating Provider/Extender: Linwood DibblesSTONE III, Golden Gilreath Weeks in Treatment: 6 Information Obtained from: Patient Chief Complaint Bilateral gluteal and right hip pressure ulcers Electronic Signature(s) Signed: 05/22/2019 1:52:35 PM By: Lenda KelpStone III, Katharin Schneider PA-C Entered By: Lenda KelpStone III, Lavert Matousek on 05/22/2019 13:52:34 [name], [name] ([id number]) -------------------------------------------------------------------------------- HPI Details Patient Name: [name], Arianna M. Date of Service: 05/22/2019 1:45 PM Medical Record Number: [id number] Patient Account Number: [id number] Date of Birth/Sex: [birth year] (53 y.o. F) Treating RN: Rodell PernaScott, Dajea Primary Care Provider: [place]O, JULIE Other Clinician: Referring Provider: [place], JULIE Treating Provider/Extender: Linwood DibblesSTONE III, Shelisa Fern Weeks in Treatment: 6 History of Present Illness HPI Description: 53 year old patient who was been an unreliable historian says she had completely healed in February and most recently her home health noticed that the wound on the right scapular area had reopened. She was in hospital about a month ago with pneumonia at that time a workup was done. We have no notes today but will workup the electronic medical record. on review of her records electronically I do not find any recent hospital MRI in the last 6 months. She had a CT scan of the chest in February 2018 which does not show any osseous lesions. she was admitted to the hospital between May 14 and May 17 for 3 days for mucus plugging and collapse of the left lung. at that time she had a decubitus ulcer of  the right scapular region stage III. her history was noted to have quadriplegia C5-C7 incomplete, neurogenic bladder, nephrostomy catheter and emphysema. patient tells me that she has not smoked for last month and a half but as per her hospital records she was smoking in the middle of May. 01/12/17 patient scapular region appears to be doing fairly well on evaluation today. She is having really no syndicate discomfort and has been tolerating the dressing changes well complication. Readmission: 04/08/2019 patient presents today for reevaluation in our clinic concerning issues she is having with wounds on the bilateral gluteal area as well as on the right trochanter. She has unfortunately noted that things seem to have gotten worse as she was using the Clinitron bed. The patient states it gives off a lot of heat and subsequently has not been good for her skin causing her to breakdown more. She has been trying to get back just to the air mattress but has not been able to get the company to come pick up that bed and bring out the air mattress. Her primary care provider is on this as well trying to get them to do so. Unfortunately her wounds are significantly large and show signs of hyper granulation as well. She has had this kind of issue for quite some time and unfortunately just does not seem to be showing signs of significant improvement with what she has been using currently. No fevers, chills, nausea, vomiting, or diarrhea. She does attempt appropriate offloading and again has been using the bed because that is all she has although she is not happy with it. 04/22/2019 on evaluation today patient appears to be doing about the same with regard to her wounds at this time. Fortunately there does not appear to be any signs of active infection at this point which  is good news. She has been tolerating the dressing changes without complication. Overall I am pleased with that with that being said  unfortunately she does have a little bit of more significant breakdown with regard to the wound bed upon evaluation today. I believe this is secondary to the fact that she has been lying potentially too much in a single position for too long over the past several weeks. I think that potentially if she were to mitigate this to a degree that things may improve somewhat. With that being said she is really not happy with her current bed she is attempted to have them come out and switch this back to an air mattress but unfortunately has not been successful in getting them to come out and make this change. She is very frustrated with that. She still has the Clinitron bed and she much prefers just having the standard air mattress. Patient's primary care provider still working on this she tells me. 05/08/2019 on evaluation today patient unfortunately is not doing as well as I would like to see in regard to her wounds. She actually is measuring a little bit larger pretty much at all wound locations unfortunately and overall I am very upset as well with the fact that she has been trying to get the Clinitron bed removed from her home but is not having any luck as such. We did get the number for the company today in order for [place] to see about calling them and switching this over to an air mattress as this is giving her a lot of trouble she states is really hot and she feels like it is breaking down her skin. This is not good. 05/22/2019 on evaluation today patient appears to be doing well with regard to the wounds she is showing some signs of slight improvement at this point. This is good news. Fortunately there is no evidence of active infection at this time which is also good news. No fevers, chills, nausea, vomiting, or diarrhea. She does tell me that in order to get rid of the Clinitron bed that she has been working on that the representative from [employer] stated they needed in order for me asked to  be [name], WARGA. ([id number]) getting rid of it and then what we wanted her to have. Again I will be more than happy to do that we can initiate an air mattress for her. ======= Old notes 53 year old patient was had quadriparesis for the last 9 years and has had previous extensive surgery and reconstruction at [facility]. For about 4-5 months she's had a decubitus ulcer on the right scapular region. she was lost to follow-up for the last 6 months. she had a couple of small areas which have opened out in the region of previous scar tissue in the sacral region but these haveall healed now. She does not have any other significant comorbidities. She has a good Roho cushion for her wheelchair and she has a specialized mattress for sleep. She has restarted smoking and smokes about half a pack of cigarettes a day 02/18/2015 -- X-ray of the pelvis -- IMPRESSION:No acute osseous injury of the pelvis. If there is clinical concern regarding osteomyelitis, recommend further evaluation with CT. X-ray of the sacrum and coccyx IMPRESSION:No acute osseous abnormality of the sacrum. If there is further clinical concern regarding osteomyelitis of the sacrum, further evaluation with CT is recommended. ========= Electronic Signature(s) Signed: 05/22/2019 2:16:17 PM By: Lenda Kelp PA-C Entered  By: Lenda Kelp on 05/22/2019 14:16:17 [name], [name] ([id number]) -------------------------------------------------------------------------------- Physical Exam Details Patient Name: [name], FOUTS. Date of Service: 05/22/2019 1:45 PM Medical Record Number: [id number] Patient Account Number: [id number] Date of Birth/Sex: 1966/03/17 (53 y.o. F) Treating RN: Rodell Perna Primary Care Provider: [place], JULIE Other Clinician: Referring Provider: [place], JULIE Treating Provider/Extender: STONE III, Talik Casique Weeks in Treatment: 6 Constitutional Well-nourished and well-hydrated in no acute  distress. Respiratory normal breathing without difficulty. clear to auscultation bilaterally. Cardiovascular regular rate and rhythm with normal S1, S2. Psychiatric this patient is able to make decisions and demonstrates good insight into disease process. Alert and Oriented x 3. pleasant and cooperative. Notes With regard to patient's wound bed again at this time she does show some signs of improvement overall the wounds do not look quite as bad as they did in the past. With that being said she still is having some issues with some wound complications as far as the bed is concerned but again we are working on getting that fixed. Electronic Signature(s) Signed: 05/22/2019 2:17:11 PM By: Lenda Kelp PA-C Entered By: Lenda Kelp on 05/22/2019 14:17:10 [name], [name] -------------------------------------------------------------------------------- Physician Orders Details Patient Name: VIRLEE, STROSCHEIN. Date of Service: 05/22/2019 1:45 PM Medical Record Number: [id number] Patient Account Number: [id number] Date of Birth/Sex: 04-Jun-1966 (53 y.o. F) Treating RN: Rodell Perna Primary Care Provider: [place], JULIE Other Clinician: Referring Provider: [place], JULIE Treating Provider/Extender: Linwood Dibbles, Richy Spradley Weeks in Treatment: 6 Verbal / Phone Orders: No Diagnosis Coding ICD-10 Coding Code Description L89.313 Pressure ulcer of right buttock, stage 3 L89.323 Pressure ulcer of left buttock, stage 3 L89.213 Pressure ulcer of right hip, stage 3 G82.52 Quadriplegia, C1-C4 incomplete E44.0 Moderate protein-calorie malnutrition Wound Cleansing Wound #5 Right Gluteus o Clean wound with Normal Saline. o May Shower, gently pat wound dry prior to applying new dressing. Wound #6 Right Sacrum o Clean wound with Normal Saline. o May Shower, gently pat wound dry prior to applying new dressing. Wound #7 Right Trochanter o Clean wound with Normal Saline. o May  Shower, gently pat wound dry prior to applying new dressing. Primary Wound Dressing Wound #5 Right Gluteus o Silver Alginate - Silver cell specifically. Wet the dressing for removal during dressing change. Wound #6 Right Sacrum o Silver Alginate - Silver cell specifically. Wet the dressing for removal during dressing change. Wound #7 Right Trochanter o Silver Alginate - Silver cell specifically. Wet the dressing for removal during dressing change. Secondary Dressing Wound #5 Right Gluteus o Boardered Foam Dressing Wound #6 Right Sacrum o Boardered Foam Dressing Wound #7 Right Trochanter o Boardered Foam Dressing Dressing Change Frequency [name], BATTY. ([id number]) Wound #5 Right Gluteus o Change Dressing Monday, Wednesday, Friday - or three times weekly Wound #6 Right Sacrum o Change Dressing Monday, Wednesday, Friday - or three times weekly Wound #7 Right Trochanter o Change Dressing Monday, Wednesday, Friday - or three times weekly Follow-up Appointments Wound #5 Right Gluteus o Return Appointment in 2 weeks. Wound #6 Right Sacrum o Return Appointment in 2 weeks. Wound #7 Right Trochanter o Return Appointment in 2 weeks. Off-Loading Wound #5 Right Gluteus o Mattress - air mattress or fluidized air mattress o Turn and reposition every 2 hours Wound #6 Right Sacrum o Mattress - air mattress or fluidized air mattress o Turn and reposition every 2 hours Wound #7 Right Trochanter o Mattress - air mattress or fluidized air mattress o Turn and reposition every 2 hours Additional Orders /  Instructions Wound #5 Right Gluteus o Increase protein intake. Wound #6 Right Sacrum o Increase protein intake. Wound #7 Right Trochanter o Increase protein intake. Home Health Wound #5 Right Gluteus o Continue Home Health Visits - [facility] - Please provide dressing materials for patient o Home Health Nurse may visit PRN to address  patientos wound care needs. o FACE TO FACE ENCOUNTER: MEDICARE and MEDICAID PATIENTS: I certify that this patient is under my care and that I had a face-to-face encounter that meets the physician face-to-face encounter requirements with this patient on this date. The encounter with the patient was in whole or in part for the following MEDICAL CONDITION: (primary reason for Home Healthcare) MEDICAL NECESSITY: I certify, that based on my findings, NURSING services are a medically necessary home health service. HOME BOUND STATUS: I certify that my clinical findings support that this patient is homebound (i.e., Due to illness or injury, pt requires aid of supportive devices such as crutches, cane, wheelchairs, walkers, the use of special transportation or the assistance of another person to leave their place of residence. There is a normal inability to leave the home and doing so requires considerable and taxing effort. Other absences are for medical reasons / religious services and are infrequent or of short duration when for other reasons). [name], MEDAGLIA. ([id number]) o If current dressing causes regression in wound condition, may D/C ordered dressing product/s and apply Normal Saline Moist Dressing daily until next Wound Healing Center / Other MD appointment. Notify Wound Healing Center of regression in wound condition at [phone]. o Please direct any NON-WOUND related issues/requests for orders to patient's Primary Care Physician Wound #6 Right Sacrum o Continue Home Health Visits - [facility] - Please provide dressing materials for patient o Home Health Nurse may visit PRN to address patientos wound care needs. o FACE TO FACE ENCOUNTER: MEDICARE and MEDICAID PATIENTS: I certify that this patient is under my care and that I had a face-to-face encounter that meets the physician face-to-face encounter requirements with this patient on this date. The encounter with the patient was  in whole or in part for the following MEDICAL CONDITION: (primary reason for Home Healthcare) MEDICAL NECESSITY: I certify, that based on my findings, NURSING services are a medically necessary home health service. HOME BOUND STATUS: I certify that my clinical findings support that this patient is homebound (i.e., Due to illness or injury, pt requires aid of supportive devices such as crutches, cane, wheelchairs, walkers, the use of special transportation or the assistance of another person to leave their place of residence. There is a normal inability to leave the home and doing so requires considerable and taxing effort. Other absences are for medical reasons / religious services and are infrequent or of short duration when for other reasons). o If current dressing causes regression in wound condition, may D/C ordered dressing product/s and apply Normal Saline Moist Dressing daily until next Wound Healing Center / Other MD appointment. Notify Wound Healing Center of regression in wound condition at [phone]. o Please direct any NON-WOUND related issues/requests for orders to patient's Primary Care Physician Wound #7 Right Trochanter o Continue Home Health Visits - [facility] - Please provide dressing materials for patient o Home Health Nurse may visit PRN to address patientos wound care needs. o FACE TO FACE ENCOUNTER: MEDICARE and MEDICAID PATIENTS: I certify that this patient is under my care and that I had a face-to-face encounter that meets the physician face-to-face encounter requirements with this patient on  this date. The encounter with the patient was in whole or in part for the following MEDICAL CONDITION: (primary reason for Home Healthcare) MEDICAL NECESSITY: I certify, that based on my findings, NURSING services are a medically necessary home health service. HOME BOUND STATUS: I certify that my clinical findings support that this patient is homebound (i.e., Due to  illness or injury, pt requires aid of supportive devices such as crutches, cane, wheelchairs, walkers, the use of special transportation or the assistance of another person to leave their place of residence. There is a normal inability to leave the home and doing so requires considerable and taxing effort. Other absences are for medical reasons / religious services and are infrequent or of short duration when for other reasons). o If current dressing causes regression in wound condition, may D/C ordered dressing product/s and apply Normal Saline Moist Dressing daily until next Wound Healing Center / Other MD appointment. Notify Wound Healing Center of regression in wound condition at [phone]. o Please direct any NON-WOUND related issues/requests for orders to patient's Primary Care Physician Electronic Signature(s) Signed: 05/23/2019 8:45:19 AM By: Rodell Perna Signed: 05/23/2019 1:53:04 PM By: Lenda Kelp PA-C Entered By: Rodell Perna on 05/22/2019 14:12:22 [name], [name] ([id number]) -------------------------------------------------------------------------------- Problem List Details Patient Name: DEWAYNE, JUREK. Date of Service: 05/22/2019 1:45 PM Medical Record Number: [id number] Patient Account Number: [id number] Date of Birth/Sex: [birth year] (53 y.o. F) Treating RN: Rodell Perna Primary Care Provider: [place], JULIE Other Clinician: Referring Provider: [place], JULIE Treating Provider/Extender: Linwood Dibbles, Dean Wonder Weeks in Treatment: 6 Active Problems ICD-10 Evaluated Encounter Code Description Active Date Today Diagnosis L89.313 Pressure ulcer of right buttock, stage 3 04/08/2019 No Yes L89.323 Pressure ulcer of left buttock, stage 3 04/08/2019 No Yes L89.213 Pressure ulcer of right hip, stage 3 04/08/2019 No Yes G82.52 Quadriplegia, C1-C4 incomplete 04/08/2019 No Yes E44.0 Moderate protein-calorie malnutrition 04/08/2019 No Yes Inactive Problems Resolved  Problems Electronic Signature(s) Signed: 05/22/2019 1:52:26 PM By: Lenda Kelp PA-C Entered By: Lenda Kelp on 05/22/2019 13:52:26 [place], [name] ([id number]) -------------------------------------------------------------------------------- Progress Note Details Patient Name: [name] Masters. Date of Service: 05/22/2019 1:45 PM Medical Record Number: [id number] Patient Account Number: [id number] Date of Birth/Sex: 1966-01-05 (53 y.o. F) Treating RN: Rodell Perna Primary Care Provider: [place], JULIE Other Clinician: Referring Provider: [place], JULIE Treating Provider/Extender: Linwood Dibbles, Koda Defrank Weeks in Treatment: 6 Subjective Chief Complaint Information obtained from Patient Bilateral gluteal and right hip pressure ulcers History of Present Illness (HPI) 53 year old patient who was been an unreliable historian says she had completely healed in February and most recently her home health noticed that the wound on the right scapular area had reopened. She was in hospital about a month ago with pneumonia at that time a workup was done. We have no notes today but will workup the electronic medical record. on review of her records electronically I do not find any recent hospital MRI in the last 6 months. She had a CT scan of the chest in February 2018 which does not show any osseous lesions. she was admitted to the hospital between May 14 and May 17 for 3 days for mucus plugging and collapse of the left lung. at that time she had a decubitus ulcer of the right scapular region stage III. her history was noted to have quadriplegia C5-C7 incomplete, neurogenic bladder, nephrostomy catheter and emphysema. patient tells me that she has not smoked for last month and a half but as per her hospital records she was  smoking in the middle of May. 01/12/17 patient scapular region appears to be doing fairly well on evaluation today. She is having really no syndicate discomfort and has been  tolerating the dressing changes well complication. Readmission: 04/08/2019 patient presents today for reevaluation in our clinic concerning issues she is having with wounds on the bilateral gluteal area as well as on the right trochanter. She has unfortunately noted that things seem to have gotten worse as she was using the Clinitron bed. The patient states it gives off a lot of heat and subsequently has not been good for her skin causing her to breakdown more. She has been trying to get back just to the air mattress but has not been able to get the company to come pick up that bed and bring out the air mattress. Her primary care provider is on this as well trying to get them to do so. Unfortunately her wounds are significantly large and show signs of hyper granulation as well. She has had this kind of issue for quite some time and unfortunately just does not seem to be showing signs of significant improvement with what she has been using currently. No fevers, chills, nausea, vomiting, or diarrhea. She does attempt appropriate offloading and again has been using the bed because that is all she has although she is not happy with it. 04/22/2019 on evaluation today patient appears to be doing about the same with regard to her wounds at this time. Fortunately there does not appear to be any signs of active infection at this point which is good news. She has been tolerating the dressing changes without complication. Overall I am pleased with that with that being said unfortunately she does have a little bit of more significant breakdown with regard to the wound bed upon evaluation today. I believe this is secondary to the fact that she has been lying potentially too much in a single position for too long over the past several weeks. I think that potentially if she were to mitigate this to a degree that things may improve somewhat. With that being said she is really not happy with her current bed she is  attempted to have them come out and switch this back to an air mattress but unfortunately has not been successful in getting them to come out and make this change. She is very frustrated with that. She still has the Clinitron bed and she much prefers just having the standard air mattress. Patient's primary care provider still working on this she tells me. 05/08/2019 on evaluation today patient unfortunately is not doing as well as I would like to see in regard to her wounds. She actually is measuring a little bit larger pretty much at all wound locations unfortunately and overall I am very upset as well with the fact that she has been trying to get the Clinitron bed removed from her home but is not having any luck as such. We did get the number for the company today in order for [place] to see about calling them and switching this over to an air mattress as Huffine, Nela M. ([id number]) this is giving her a lot of trouble she states is really hot and she feels like it is breaking down her skin. This is not good. 05/22/2019 on evaluation today patient appears to be doing well with regard to the wounds she is showing some signs of slight improvement at this point. This is good news. Fortunately there is no evidence of  active infection at this time which is also good news. No fevers, chills, nausea, vomiting, or diarrhea. She does tell me that in order to get rid of the Clinitron bed that she has been working on that the representative from the company stated they needed in order for me asked to be getting rid of it and then what we wanted her to have. Again I will be more than happy to do that we can initiate an air mattress for her. 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 For about 4-5 months she's had a decubitus ulcer on the right scapular region. she was lost to follow-up  for the last 6 months. she had a couple of small areas which have opened out in the region of previous scar tissue in the sacral region but these haveall healed now. She does not have any other significant comorbidities. She has a good Roho cushion for her wheelchair and she has a specialized mattress for sleep. She has restarted smoking and smokes about half a pack of cigarettes a day 02/18/2015 -- X-ray of the pelvis -- IMPRESSION:No acute osseous injury of the pelvis. If there is clinical concern regarding osteomyelitis, recommend further evaluation with CT. X-ray of the sacrum and coccyx IMPRESSION:No acute osseous abnormality of the sacrum. If there is further clinical concern regarding osteomyelitis of the sacrum, further evaluation with CT is recommended. ========= Patient History Information obtained from Patient. Family History Cancer - Mother, Diabetes - Siblings, Hypertension - Siblings, No family history of Heart Disease, Hereditary Spherocytosis, Kidney Disease, Lung Disease, Seizures, Stroke, Thyroid Problems, Tuberculosis. Social History Former smoker - ended on 03/09/2019, Marital Status - Separated, Alcohol Use - Never, Drug Use - No History, Caffeine Use - Never. Medical History Eyes Denies history of Cataracts, Glaucoma, Optic Neuritis Ear/Nose/Mouth/Throat Denies history of Chronic sinus problems/congestion, Middle ear problems Hematologic/Lymphatic Denies history of Anemia, Hemophilia, Human Immunodeficiency Virus, Lymphedema, Sickle Cell Disease Respiratory Patient has history of Asthma, Chronic Obstructive Pulmonary Disease (COPD) Cardiovascular Denies history of Angina, Arrhythmia, Congestive Heart Failure, Coronary Artery Disease, Deep Vein Thrombosis, Hypertension, Hypotension, Myocardial Infarction, Peripheral Arterial Disease, Peripheral Venous Disease, Phlebitis, Vasculitis Gastrointestinal Denies history of Cirrhosis , Colitis, Crohn s, Hepatitis A,  Hepatitis B, Hepatitis C Endocrine Denies history of Type I Diabetes, Type II Diabetes Genitourinary Denies history of End Stage Renal Disease Immunological Denies history of Lupus Erythematosus, Raynaud s, Scleroderma Integumentary (Skin) Patient has history of History of pressure wounds Denies history of History of Burn KAIJAH, ABTS. ([id number]) Musculoskeletal Denies history of Gout, Rheumatoid Arthritis, Osteoarthritis, Osteomyelitis Neurologic Patient has history of Paraplegia - C 3 paraplegia Oncologic Denies history of Received Chemotherapy, Received Radiation Hospitalization/Surgery History - collapsed lung and lung congestion. Medical And Surgical History Notes Genitourinary urostomy Musculoskeletal right arm contracture Review of Systems (ROS) Constitutional Symptoms (General Health) Denies complaints or symptoms of Fatigue, Fever, Chills, Marked Weight Change. Respiratory Denies complaints or symptoms of Chronic or frequent coughs, Shortness of Breath. Cardiovascular Denies complaints or symptoms of Chest pain, LE edema. Psychiatric Denies complaints or symptoms of Anxiety, Claustrophobia. Objective Constitutional Well-nourished and well-hydrated in no acute distress. Vitals Time Taken: 1:48 PM, Height: 63 in, Weight: 70 lbs, BMI: 12.4, Temperature: 98.8 F, Pulse: 79 bpm, Respiratory Rate: 18 breaths/min, Blood Pressure: 118/67 mmHg. Respiratory normal breathing without difficulty. clear to auscultation bilaterally. Cardiovascular regular rate and rhythm with normal S1, S2.  Psychiatric this patient is able to make decisions and demonstrates good insight into disease process. Alert and Oriented x 3. pleasant and cooperative. General Notes: With regard to patient's wound bed again at this time she does show some signs of improvement overall the wounds do not look quite as bad as they did in the past. With that being said she still is having some issues  with some wound complications as far as the bed is concerned but again we are working on getting that fixed. Integumentary (Hair, Skin) Wound #5 status is Open. Original cause of wound was Gradually Appeared. The wound is located on the Right Gluteus. The LAKIN, RHINE. ([id number]) wound measures 3.1cm length x 5.2cm width x 0.2cm depth; 12.661cm^2 area and 2.532cm^3 volume. There is Fat Layer (Subcutaneous Tissue) Exposed exposed. There is no tunneling or undermining noted. There is a large amount of serous drainage noted. The wound margin is flat and intact. There is medium (34-66%) red granulation within the wound bed. There is a medium (34-66%) amount of necrotic tissue within the wound bed including Eschar and Adherent Slough. Wound #6 status is Open. Original cause of wound was Pressure Injury. The wound is located on the Right Sacrum. The wound measures 4.3cm length x 12.5cm width x 0.1cm depth; 42.215cm^2 area and 4.222cm^3 volume. There is Fat Layer (Subcutaneous Tissue) Exposed exposed. There is no tunneling or undermining noted. There is a medium amount of serous drainage noted. The wound margin is flat and intact. There is medium (34-66%) red granulation within the wound bed. There is a medium (34-66%) amount of necrotic tissue within the wound bed including Eschar. Wound #7 status is Open. Original cause of wound was Gradually Appeared. The wound is located on the Right Trochanter. The wound measures 5.8cm length x 3.8cm width x 0.6cm depth; 17.31cm^2 area and 10.386cm^3 volume. There is Fat Layer (Subcutaneous Tissue) Exposed exposed. There is no tunneling noted, however, there is undermining starting at 7:00 and ending at 9:00 with a maximum distance of 0.9cm. There is additional undermining and at 12:00 and ending at 3:00 with a maximum distance of 1.2cm. There is a medium amount of serous drainage noted. The wound margin is flat and intact. There is medium (34-66%) red  granulation within the wound bed. There is a medium (34-66%) amount of necrotic tissue within the wound bed including Eschar. 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 May Shower, gently pat wound dry prior to applying new dressing. Wound #6 Right Sacrum: Clean wound with Normal Saline. May Shower, gently pat wound dry prior to applying new dressing. Wound #7 Right Trochanter: Clean wound with Normal Saline. May Shower, gently pat wound dry prior to applying new dressing. Primary Wound Dressing: Wound #5 Right Gluteus: Silver Alginate - Silver cell specifically. Wet the dressing for removal during dressing change. Wound #6 Right Sacrum: Silver Alginate - Silver cell specifically. Wet the dressing for removal during dressing change. Wound #7 Right Trochanter: Silver Alginate - Silver cell specifically. Wet the dressing for removal during dressing change. Secondary Dressing: [name], [name] ([id number]) Wound #5 Right Gluteus: Boardered Foam Dressing Wound #6 Right Sacrum: Boardered Foam Dressing Wound #7 Right Trochanter: Boardered Foam Dressing Dressing Change Frequency: Wound #5 Right Gluteus: Change Dressing Monday, Wednesday, Friday - or three  times weekly Wound #6 Right Sacrum: Change Dressing Monday, Wednesday, Friday - or three times weekly Wound #7 Right Trochanter: Change Dressing Monday, Wednesday, Friday - or three times weekly Follow-up Appointments: Wound #5 Right Gluteus: Return Appointment in 2 weeks. Wound #6 Right Sacrum: Return Appointment in 2 weeks. Wound #7 Right Trochanter: Return Appointment in 2 weeks. Off-Loading: Wound #5 Right Gluteus: Mattress - air mattress or fluidized air  mattress Turn and reposition every 2 hours Wound #6 Right Sacrum: Mattress - air mattress or fluidized air mattress Turn and reposition every 2 hours Wound #7 Right Trochanter: Mattress - air mattress or fluidized air mattress Turn and reposition every 2 hours Additional Orders / Instructions: Wound #5 Right Gluteus: Increase protein intake. Wound #6 Right Sacrum: Increase protein intake. Wound #7 Right Trochanter: Increase protein intake. Home Health: Wound #5 Right Gluteus: Continue Home Health Visits - [facility] - Please provide dressing materials for patient Home Health Nurse may visit PRN to address patient s wound care needs. FACE TO FACE ENCOUNTER: MEDICARE and MEDICAID PATIENTS: I certify that this patient is under my care and that I had a face-to-face encounter that meets the physician face-to-face encounter requirements with this patient on this date. The encounter with the patient was in whole or in part for the following MEDICAL CONDITION: (primary reason for Home Healthcare) MEDICAL NECESSITY: I certify, that based on my findings, NURSING services are a medically necessary home health service. HOME BOUND STATUS: I certify that my clinical findings support that this patient is homebound (i.e., Due to illness or injury, pt requires aid of supportive devices such as crutches, cane, wheelchairs, walkers, the use of special transportation or the assistance of another person to leave their place of residence. There is a normal inability to leave the home and doing so requires considerable and taxing effort. Other absences are for medical reasons / religious services and are infrequent or of short duration when for other reasons). If current dressing causes regression in wound condition, may D/C ordered dressing product/s and apply Normal Saline Moist Dressing daily until next Wound Healing Center / Other MD appointment. Notify Wound Healing Center of regression in wound condition  at [phone]. Please direct any NON-WOUND related issues/requests for orders to patient's Primary Care Physician Wound #6 Right Sacrum: Continue Home Health Visits - [facility] - Please provide dressing materials for patient Home Health Nurse may visit PRN to address patient s wound care needs. FACE TO FACE ENCOUNTER: MEDICARE and MEDICAID PATIENTS: I certify that this patient is under my care and that I had a face-to-face encounter that meets the physician face-to-face encounter requirements with this patient on this date. The encounter with the patient was in whole or in part for the following MEDICAL CONDITION: (primary reason for Home [name], BUSSA. ([id number]) Healthcare) MEDICAL NECESSITY: I certify, that based on my findings, NURSING services are a medically necessary home health service. HOME BOUND STATUS: I certify that my clinical findings support that this patient is homebound (i.e., Due to illness or injury, pt requires aid of supportive devices such as crutches, cane, wheelchairs, walkers, the use of special transportation or the assistance of another person to leave their place of residence. There is a normal inability to leave the home and doing so requires considerable and taxing effort. Other absences are for medical reasons / religious services and are infrequent or of short duration when for other reasons). If current dressing causes regression in wound condition, may D/C ordered dressing product/s and  apply Normal Saline Moist Dressing daily until next Wound Healing Center / Other MD appointment. Notify Wound Healing Center of regression in wound condition at [phone]. Please direct any NON-WOUND related issues/requests for orders to patient's Primary Care Physician Wound #7 Right Trochanter: Continue Home Health Visits - [facility] - Please provide dressing materials for patient Home Health Nurse may visit PRN to address patient s wound care needs. FACE TO FACE  ENCOUNTER: MEDICARE and MEDICAID PATIENTS: I certify that this patient is under my care and that I had a face-to-face encounter that meets the physician face-to-face encounter requirements with this patient on this date. The encounter with the patient was in whole or in part for the following MEDICAL CONDITION: (primary reason for Home Healthcare) MEDICAL NECESSITY: I certify, that based on my findings, NURSING services are a medically necessary home health service. HOME BOUND STATUS: I certify that my clinical findings support that this patient is homebound (i.e., Due to illness or injury, pt requires aid of supportive devices such as crutches, cane, wheelchairs, walkers, the use of special transportation or the assistance of another person to leave their place of residence. There is a normal inability to leave the home and doing so requires considerable and taxing effort. Other absences are for medical reasons / religious services and are infrequent or of short duration when for other reasons). If current dressing causes regression in wound condition, may D/C ordered dressing product/s and apply Normal Saline Moist Dressing daily until next Wound Healing Center / Other MD appointment. Notify Wound Healing Center of regression in wound condition at [phone]. Please direct any NON-WOUND related issues/requests for orders to patient's Primary Care Physician 1 I would recommend currently that we go ahead and get things situated as far as the bed is concerned we are going to discontinue the [place] bed and switch to just an alternating air mattress which I think will work well for the patient she is previously preferred this as well. 2. I would recommend that we continue with a silver alginate dressing at this point I think that has done well for her based on what I am seeing she now has the supplies and full and should have no problems being able to perform those dressing changes. 3. I am also  going to recommend that we continue with the appropriate offloading and once we get the new bed I think that would make a difference as well. We will see patient back for reevaluation in 2 weeks here in the clinic. If anything worsens or changes patient will contact our office for additional recommendations. Electronic Signature(s) Signed: 05/22/2019 2:18:54 PM By: Lenda Kelp PA-C Entered By: Lenda Kelp on 05/22/2019 14:18:54 Hires, [name] ([id number]) -------------------------------------------------------------------------------- ROS/PFSH Details Patient Name: [name], GEHLING. Date of Service: 05/22/2019 1:45 PM Medical Record Number: [id number] Patient Account Number: [id number] Date of Birth/Sex: 09/24/1965 (53 y.o. F) Treating RN: Rodell Perna Primary Care Provider: [place], JULIE Other Clinician: Referring Provider: [place], JULIE Treating Provider/Extender: STONE III, Rondey Fallen Weeks in Treatment: 6 Information Obtained From Patient Constitutional Symptoms (General Health) Complaints and Symptoms: Negative for: Fatigue; Fever; Chills; Marked Weight Change Respiratory Complaints and Symptoms: Negative for: Chronic or frequent coughs; Shortness of Breath Medical History: Positive for: Asthma; Chronic Obstructive Pulmonary Disease (COPD) Cardiovascular Complaints and Symptoms: Negative for: Chest pain; LE edema Medical History: Negative for: Angina; Arrhythmia; Congestive Heart Failure; Coronary Artery Disease; Deep Vein Thrombosis; Hypertension; Hypotension; Myocardial Infarction; Peripheral Arterial Disease; Peripheral Venous Disease; Phlebitis; Vasculitis Psychiatric  Complaints and Symptoms: Negative for: Anxiety; Claustrophobia Eyes Medical History: Negative for: Cataracts; Glaucoma; Optic Neuritis Ear/Nose/Mouth/Throat Medical History: Negative for: Chronic sinus problems/congestion; Middle ear problems Hematologic/Lymphatic Medical History: Negative  for: Anemia; Hemophilia; Human Immunodeficiency Virus; Lymphedema; Sickle Cell Disease Gastrointestinal Medical History: Negative for: Cirrhosis ; Colitis; Crohnos; Hepatitis A; Hepatitis B; Hepatitis C Thurston, Suhey M. ([id number]) Endocrine Medical History: Negative for: Type I Diabetes; Type II Diabetes Genitourinary Medical History: Negative for: End Stage Renal Disease Past Medical History Notes: urostomy Immunological Medical History: Negative for: Lupus Erythematosus; Raynaudos; Scleroderma Integumentary (Skin) Medical History: Positive for: History of pressure wounds Negative for: History of Burn Musculoskeletal Medical History: Negative for: Gout; Rheumatoid Arthritis; Osteoarthritis; Osteomyelitis Past Medical History Notes: right arm contracture Neurologic Medical History: Positive for: Paraplegia - C 3 paraplegia Oncologic Medical History: Negative for: Received Chemotherapy; Received Radiation Immunizations Pneumococcal Vaccine: Received Pneumococcal Vaccination: Yes Implantable Devices No devices added Hospitalization / Surgery History Type of Hospitalization/Surgery collapsed lung and lung congestion Family and Social History Cancer: Yes - Mother; Diabetes: Yes - Siblings; Heart Disease: No; Hereditary Spherocytosis: No; Hypertension: Yes - Siblings; Kidney Disease: No; Lung Disease: No; Seizures: No; Stroke: No; Thyroid Problems: No; Tuberculosis: No; Former smoker - ended on 03/09/2019; Marital Status - Separated; Alcohol Use: Never; Drug Use: No History; Caffeine Use: Never; Financial Concerns: No; Food, Clothing or Shelter Needs: No; Support System Lacking: No; Transportation Concerns: No Physician Affirmation I have reviewed and agree with the above information. ELLIOTT, QUADE ([id number]) Electronic Signature(s) Signed: 05/23/2019 8:45:19 AM By: Rodell Perna Signed: 05/23/2019 1:53:04 PM By: Lenda Kelp PA-C Entered By: Lenda Kelp on  05/22/2019 14:16:45 Fleck, [name] -------------------------------------------------------------------------------- SuperBill Details Patient Name: ABUK, SELLECK. Date of Service: 05/22/2019 Medical Record Number: [id number] Patient Account Number: [id number] Date of Birth/Sex: 1965/08/03 (53 y.o. F) Treating RN: Rodell Perna Primary Care Provider: [place], JULIE Other Clinician: Referring Provider: [place], JULIE Treating Provider/Extender: Linwood Dibbles, Errik Mitchelle Weeks in Treatment: 6 Diagnosis Coding ICD-10 Codes Code Description L89.313 Pressure ulcer of right buttock, stage 3 L89.323 Pressure ulcer of left buttock, stage 3 L89.213 Pressure ulcer of right hip, stage 3 G82.52 Quadriplegia, C1-C4 incomplete E44.0 Moderate protein-calorie malnutrition Facility Procedures CPT4 Code: 62952841 Description: 99214 - WOUND CARE VISIT-LEV 4 EST PT Modifier: Quantity: 1 Physician Procedures CPT4 Code: 3244010 Description: 99214 - WC PHYS LEVEL 4 - EST PT ICD-10 Diagnosis Description L89.313 Pressure ulcer of right buttock, stage 3 L89.323 Pressure ulcer of left buttock, stage 3 L89.213 Pressure ulcer of right hip, stage 3 G82.52 Quadriplegia, C1-C4 incomplete Modifier: Quantity: 1 Electronic Signature(s) Signed: 05/22/2019 2:19:31 PM By: Lenda Kelp PA-C Entered By: Lenda Kelp on 05/22/2019 14:19:31

## 2019-05-23 NOTE — Progress Notes (Signed)
[name] ([id number]) Visit Report for 05/22/2019 Arrival Information Details Patient Name: [name], [name] Date of Service: 05/22/2019 1:45 PM Medical Record Number: [id number] Patient Account Number: [id number] Date of Birth/Sex: [birth year] (53 y.o. F) Treating RN: Harold Barban Primary Care Berlene Dixson: [place], [place] Other Clinician: Referring Aikam Vinje: [place], [place] Treating Xaden Kaufman/Extender: Melburn Hake, HOYT Weeks in Treatment: 6 Visit Information History Since Last Visit Added or deleted any medications: No Patient Arrived: Wheel Chair Any new allergies or adverse reactions: No Arrival Time: 13:46 Had a fall or experienced change in No activities of daily living that may affect Accompanied By: husband risk of falls: Transfer Assistance: Manual Signs or symptoms of abuse/neglect since last visito No Patient Identification Verified: Yes Hospitalized since last visit: No Secondary Verification Process Completed: Yes Has Dressing in Place as Prescribed: Yes Patient Requires Transmission-Based No Pain Present Now: No Precautions: Patient Has Alerts: No Electronic Signature(s) Signed: 05/22/2019 4:30:22 PM By: Harold Barban Entered By: Harold Barban on 05/22/2019 13:48:35 [name] ([id number]) -------------------------------------------------------------------------------- Clinic Level of Care Assessment Details Patient Name: [name] Date of Service: 05/22/2019 1:45 PM Medical Record Number: [id number] Patient Account Number: [id number] Date of Birth/Sex: 1966-04-09 (52 y.o. F) Treating RN: Army Melia Primary Care Abbe Bula: [place], [place]ULIE Other Clinician: Referring Eleno Weimar: [place], [place]UL[place] Treating Abdulkareem Badolato/Extender: Melburn Hake, HOYT Weeks in Treatment: 6 Clinic Level of Care Assessment Items TOOL 4 Quantity Score []  - Use when only an EandM is performed on FOLLOW-UP visit 0 ASSESSMENTS - Nursing Assessment / Reassessment X - Reassessment  of Co-morbidities (includes updates in patient status) 1 10 X- 1 5 Reassessment of Adherence to Treatment Plan ASSESSMENTS - Wound and Skin Assessment / Reassessment []  - Simple Wound Assessment / Reassessment - one wound 0 X- 3 5 Complex Wound Assessment / Reassessment - multiple wounds []  - 0 Dermatologic / Skin Assessment (not related to wound area) ASSESSMENTS - Focused Assessment []  - Circumferential Edema Measurements - multi extremities 0 []  - 0 Nutritional Assessment / Counseling / Intervention []  - 0 Lower Extremity Assessment (monofilament, tuning fork, pulses) []  - 0 Peripheral Arterial Disease Assessment (using hand held doppler) ASSESSMENTS - Ostomy and/or Continence Assessment and Care []  - Incontinence Assessment and Management 0 []  - 0 Ostomy Care Assessment and Management (repouching, etc.) PROCESS - Coordination of Care X - Simple Patient / Family Education for ongoing care 1 15 []  - 0 Complex (extensive) Patient / Family Education for ongoing care []  - 0 Staff obtains Programmer, systems, Records, Test Results / Process Orders []  - 0 Staff telephones HHA, Nursing Homes / Clarify orders / etc []  - 0 Routine Transfer to another Facility (non-emergent condition) []  - 0 Routine Hospital Admission (non-emergent condition) []  - 0 New Admissions / Biomedical engineer / Ordering NPWT, Apligraf, etc. []  - 0 Emergency Hospital Admission (emergent condition) X- 1 10 Simple Discharge Coordination [name], [name] ([id number]) []  - 0 Complex (extensive) Discharge Coordination PROCESS - Special Needs []  - Pediatric / Minor Patient Management 0 []  - 0 Isolation Patient Management []  - 0 Hearing / Language / Visual special needs []  - 0 Assessment of Community assistance (transportation, D/C planning, etc.) []  - 0 Additional assistance / Altered mentation []  - 0 Support Surface(s) Assessment (bed, cushion, seat, etc.) INTERVENTIONS - Wound Cleansing /  Measurement []  - Simple Wound Cleansing - one wound 0 X- 3 5 Complex Wound Cleansing - multiple wounds X- 1 5 Wound Imaging (photographs - any number of wounds) []  - 0 Wound Tracing (  instead of photographs)  - 0 Simple Wound Measurement - one wound X- 3 5 Complex Wound Measurement - multiple wounds INTERVENTIONS - Wound Dressings  - Small Wound Dressing one or multiple wounds 0 X- 3 15 Medium Wound Dressing one or multiple wounds  - 0 Large Wound Dressing one or multiple wounds  - 0 Application of Medications - topical  - 0 Application of Medications - injection INTERVENTIONS - Miscellaneous  - External ear exam 0  - 0 Specimen Collection (cultures, biopsies, blood, body fluids, etc.)  - 0 Specimen(s) / Culture(s) sent or taken to Lab for analysis  - 0 Patient Transfer (multiple staff / Nurse, adult / Similar devices)  - 0 Simple Staple / Suture removal (25 or less)  - 0 Complex Staple / Suture removal (26 or more)  - 0 Hypo / Hyperglycemic Management (close monitor of Blood Glucose)  - 0 Ankle / Brachial Index (ABI) - do not check if billed separately X- 1 5 Vital Signs [name], [name] ([id number]) Has the patient been seen at the hospital within the last three years: Yes Total Score: 140 Level Of Care: New/Established - Level 4 Electronic Signature(s) Signed: 05/23/2019 8:45:19 AM By: Rodell Perna Entered By: Rodell Perna on 05/22/2019 14:13:34 [name], [name] ([id number]) -------------------------------------------------------------------------------- Encounter Discharge Information Details Patient Name: [name] Date of Service: 05/22/2019 1:45 PM Medical Record Number: [id number] Patient Account Number: [id number] Date of Birth/Sex: 1966-05-13 (53 y.o. F) Treating RN: Rodell Perna Primary Care Lionardo Haze: [place], [place]ULIE Other Clinician: Referring Kimsey Demaree: [place], [place] Treating Assyria Morreale/Extender: Linwood Dibbles, HOYT Weeks in  Treatment: 6 Encounter Discharge Information Items Discharge Condition: Stable Ambulatory Status: Wheelchair Discharge Destination: Home Transportation: Private Auto Accompanied By: caregiver Schedule Follow-up Appointment: Yes Clinical Summary of Care: Electronic Signature(s) Signed: 05/23/2019 8:45:19 AM By: Rodell Perna Entered By: Rodell Perna on 05/22/2019 14:14:19 [name], [name] ([id number]) -------------------------------------------------------------------------------- Lower Extremity Assessment Details Patient Name: ASHONTE, ANGELUCCI. Date of Service: 05/22/2019 1:45 PM Medical Record Number: [id number] Patient Account Number: [id number] Date of Birth/Sex: [birth year] (53 y.o. F) Treating RN: Arnette Norris Primary Care Kazuo Durnil: [place], [place]ULIE Other Clinician: Referring Dandrea Medders: [place], [place] Treating Purcell Jungbluth/Extender: Linwood Dibbles, HOYT Weeks in Treatment: 6 Electronic Signature(s) Signed: 05/22/2019 4:30:22 PM By: Arnette Norris Entered By: Arnette Norris on 05/22/2019 14:02:05 [name], [name] ([id number]) -------------------------------------------------------------------------------- Multi Wound Chart Details Patient Name: [name], [name] Date of Service: 05/22/2019 1:45 PM Medical Record Number: [id number] Patient Account Number: [id number] Date of Birth/Sex: 11-21-1965 (53 y.o. F) Treating RN: Rodell Perna Primary Care Alvita Fana: [place], [place]IE Other Clinician: Referring Abhinav Mayorquin: [place], [place] Treating Huie Ghuman/Extender: STONE III, HOYT Weeks in Treatment: 6 Vital Signs Height(in): 63 Pulse(bpm): 79 Weight(lbs): 70 Blood Pressure(mmHg): 118/67 Body Mass Index(BMI): 12 Temperature(F): 98.8 Respiratory Rate 18 (breaths/min): Photos: Wound Location: Right Gluteus Right Sacrum Right Trochanter Wounding Event: Gradually Appeared Pressure Injury Gradually Appeared Primary Etiology: Pressure Ulcer Pressure Ulcer Pressure Ulcer Comorbid History:  Asthma, Chronic Obstructive Asthma, Chronic Obstructive Asthma, Chronic Obstructive Pulmonary Disease (COPD), Pulmonary Disease (COPD), Pulmonary Disease (COPD), History of pressure wounds, History of pressure wounds, History of pressure wounds, Paraplegia Paraplegia Paraplegia Date Acquired: 10/16/2018 10/16/2018 04/02/2018 Weeks of Treatment: Wound Status: Open Open Open Clustered Wound: Yes No No Clustered Quantity: 3 N/A N/A Measurements L x W x D 3.1x5.2x0.2 4.3x12.5x0.1 5.8x3.8x0.6 (cm) Area (cm) : 12.661 42.215 17.31 Volume (cm) : 2.532 4.222 10.386 % Reduction in Area: 73.10% -35675.40% -10.20% % Reduction in Volume: 92.30% -35083.30% 26.50% Starting Position 1 7 (  o'clock): Ending Position 1 9 (o'clock): Maximum Distance 1 (cm): 0.9 Starting Position 2 12 (o'clock): Ending Position 2 3 (o'clock): Maximum Distance 2 (cm): 1.2 Undermining: No No Yes ALAUNA, HAYDEN. ([id number]) Classification: Category/Stage III Category/Stage III Category/Stage III Exudate Amount: Large Medium Medium Exudate Type: Serous Serous Serous Exudate Color: amber amber amber Wound Margin: Flat and Intact Flat and Intact Flat and Intact Granulation Amount: Medium (34-66%) Medium (34-66%) Medium (34-66%) Granulation Quality: Red Red Red Necrotic Amount: Medium (34-66%) Medium (34-66%) Medium (34-66%) Necrotic Tissue: Eschar, Adherent Slough Eschar Eschar Exposed Structures: Fat Layer (Subcutaneous Fat Layer (Subcutaneous Fat Layer (Subcutaneous Tissue) Exposed: Yes Tissue) Exposed: Yes Tissue) Exposed: Yes Fascia: No Fascia: No Fascia: No Tendon: No Tendon: No Tendon: No Muscle: No Muscle: No Muscle: No Joint: No Joint: No Joint: No Bone: No Bone: No Bone: No Epithelialization: Small (1-33%) Small (1-33%) Small (1-33%) Treatment Notes Electronic Signature(s) Signed: 05/23/2019 8:45:19 AM By: Rodell Perna Entered By: Rodell Perna on 05/22/2019 14:11:25 Einstein, [name]  ([id number]) -------------------------------------------------------------------------------- Multi-Disciplinary Care Plan Details Patient Name: [name], [name] Date of Service: 05/22/2019 1:45 PM Medical Record Number: [id number] Patient Account Number: [id number] Date of Birth/Sex: 09-06-1965 (53 y.o. F) Treating RN: Rodell Perna Primary Care Marka Treloar: [place], [place]IE Other Clinician: Referring Kyshon Tolliver: [place], [place] Treating Rojean Ige/Extender: Linwood Dibbles, HOYT Weeks in Treatment: 6 Active Inactive Abuse / Safety / Falls / Self Care Management Nursing Diagnoses: Impaired physical mobility Goals: Patient will not develop complications from immobility Date Initiated: 04/08/2019 Target Resolution Date: 07/12/2019 Goal Status: Active Interventions: Assess fall risk on admission and as needed Notes: Nutrition Nursing Diagnoses: Potential for alteratiion in Nutrition/Potential for imbalanced nutrition Goals: Patient/caregiver agrees to and verbalizes understanding of need to use nutritional supplements and/or vitamins as prescribed Date Initiated: 04/08/2019 Target Resolution Date: 07/12/2019 Goal Status: Active Interventions: Assess patient nutrition upon admission and as needed per policy Notes: Orientation to the Wound Care Program Nursing Diagnoses: Knowledge deficit related to the wound healing center program Goals: Patient/caregiver will verbalize understanding of the Wound Healing Center Program Date Initiated: 04/08/2019 Target Resolution Date: 07/12/2019 Goal Status: Active Interventions: Provide education on orientation to the wound center [name], [name] ([id number]) Notes: Pressure Nursing Diagnoses: Knowledge deficit related to management of pressures ulcers Goals: Patient will remain free of pressure ulcers Date Initiated: 04/08/2019 Target Resolution Date: 07/12/2019 Goal Status: Active Interventions: Assess potential for pressure ulcer upon admission and as  needed Notes: Wound/Skin Impairment Nursing Diagnoses: Impaired tissue integrity Goals: Ulcer/skin breakdown will heal within 14 weeks Date Initiated: 04/08/2019 Target Resolution Date: 07/12/2019 Goal Status: Active Interventions: Assess patient/caregiver ability to obtain necessary supplies Assess patient/caregiver ability to perform ulcer/skin care regimen upon admission and as needed Assess ulceration(s) every visit Notes: Electronic Signature(s) Signed: 05/23/2019 8:45:19 AM By: Rodell Perna Entered By: Rodell Perna on 05/22/2019 14:10:04 [name], [name] ([id number]) -------------------------------------------------------------------------------- Pain Assessment Details Patient Name: [name] Date of Service: 05/22/2019 1:45 PM Medical Record Number: [id number] Patient Account Number: [id number] Date of Birth/Sex: 04-30-1966 (53 y.o. F) Treating RN: Arnette Norris Primary Care Saori Umholtz: [place], [place] Other Clinician: Referring Maila Dukes: [place], [place] Treating Anthonny Schiller/Extender: Linwood Dibbles, HOYT Weeks in Treatment: 6 Active Problems Location of Pain Severity and Description of Pain Patient Has Paino No Site Locations Pain Management and Medication Current Pain Management: Electronic Signature(s) Signed: 05/22/2019 4:30:22 PM By: Arnette Norris Entered By: Arnette Norris on 05/22/2019 13:48:42 Scheuermann, [name] ([id number]) -------------------------------------------------------------------------------- Patient/Caregiver Education Details Patient Name: ZOELLE, MARKUS. Date of Service: 05/22/2019 1:45  PM Medical Record Number: [id number] Patient Account Number: [id number] Date of Birth/Gender: 1965/09/14 (53 y.o. F) Treating RN: Rodell PernaScott, Dajea Primary Care Physician: [place], [place] Other Clinician: Referring Physician: [place], [place] Treating Physician/Extender: Skeet SimmerSTONE III, HOYT Weeks in Treatment: 6 Education Assessment Education Provided  To: Patient Education Topics Provided Wound/Skin Impairment: Handouts: Caring for Your Ulcer Methods: Demonstration, Explain/Verbal Responses: State content correctly Electronic Signature(s) Signed: 05/23/2019 8:45:19 AM By: Rodell PernaScott, Dajea Entered By: Rodell PernaScott, Dajea on 05/22/2019 14:13:46 [name], [name] ([id number]) -------------------------------------------------------------------------------- Wound Assessment Details Patient Name: [name] MastersWADE, Malary M. Date of Service: 05/22/2019 1:45 PM Medical Record Number: [id number] Patient Account Number: [id number] Date of Birth/Sex: 1965/09/14 (53 y.o. F) Treating RN: Arnette NorrisBiell, Kristina Primary Care Angellica Maddison: [place], [place] Other Clinician: Referring Jimmie Dattilio: [place], [place] Treating Justus Duerr/Extender: STONE III, HOYT Weeks in Treatment: 6 Wound Status Wound Number: 5 Primary Pressure Ulcer Etiology: Wound Location: Right Gluteus Wound Open Wounding Event: Gradually Appeared Status: Date Acquired: 10/16/2018 Comorbid Asthma, Chronic Obstructive Pulmonary Weeks Of Treatment: 6 History: Disease (COPD), History of pressure wounds, Clustered Wound: Yes Paraplegia Photos Wound Measurements Length: (cm) 3.1 % Reduction i Width: (cm) 5.2 % Reduction i Depth: (cm) 0.2 Epithelializa Clustered Quantity: 3 Tunneling: Area: (cm) 12.661 Undermining: Volume: (cm) 2.532 n Area: 73.1% n Volume: 92.3% tion: Small (1-33%) No No Wound Description Classification: Category/Stage III Foul Odor Aft Wound Margin: Flat and Intact Slough/Fibrin Exudate Amount: Large Exudate Type: Serous Exudate Color: amber er Cleansing: No o Yes Wound Bed Granulation Amount: Medium (34-66%) Exposed Structure Granulation Quality: Red Fascia Exposed: No Necrotic Amount: Medium (34-66%) Fat Layer (Subcutaneous Tissue) Exposed: Yes Necrotic Quality: Eschar, Adherent Slough Tendon Exposed: No Muscle Exposed: No Joint Exposed: No Bone Exposed: No Gautier, Keyosha  M. ([id number]) Treatment Notes Wound #5 (Right Gluteus) Notes silver cel, BFD Electronic Signature(s) Signed: 05/22/2019 4:30:22 PM By: Arnette NorrisBiell, Kristina Entered By: Arnette NorrisBiell, Kristina on 05/22/2019 14:00:21 [name], [name] ([id number]) -------------------------------------------------------------------------------- Wound Assessment Details Patient Name: [name] MastersWADE, Dilana M. Date of Service: 05/22/2019 1:45 PM Medical Record Number: [id number] Patient Account Number: [id number] Date of Birth/Sex: 1965/09/14 (53 y.o. F) Treating RN: Arnette NorrisBiell, Kristina Primary Care Rabecca Birge: [place] Other Clinician: Referring Myca Perno: [place], [place] Treating Rozelia Catapano/Extender: STONE III, HOYT Weeks in Treatment: 6 Wound Status Wound Number: 6 Primary Pressure Ulcer Etiology: Wound Location: Right Sacrum Wound Open Wounding Event: Pressure Injury Status: Date Acquired: 10/16/2018 Comorbid Asthma, Chronic Obstructive Pulmonary Weeks Of Treatment: 6 History: Disease (COPD), History of pressure wounds, Clustered Wound: No Paraplegia Photos Wound Measurements Length: (cm) 4.3 % Reduction in Width: (cm) 12.5 % Reduction in Depth: (cm) 0.1 Epithelializati Area: (cm) 42.215 Tunneling: Volume: (cm) 4.222 Undermining: Area: -35675.4% Volume: -35083.3% on: Small (1-33%) No No Wound Description Classification: Category/Stage III Foul Odor After Wound Margin: Flat and Intact Slough/Fibrino Exudate Amount: Medium Exudate Type: Serous Exudate Color: amber Cleansing: No No Wound Bed Granulation Amount: Medium (34-66%) Exposed Structure Granulation Quality: Red Fascia Exposed: No Necrotic Amount: Medium (34-66%) Fat Layer (Subcutaneous Tissue) Exposed: Yes Necrotic Quality: Eschar Tendon Exposed: No Muscle Exposed: No Joint Exposed: No Bone Exposed: No Treatment Notes Leppla, Germani M. ([id number]) Wound #6 (Right Sacrum) Notes silver cel, BFD Electronic Signature(s) Signed:  05/22/2019 4:30:22 PM By: Arnette NorrisBiell, Kristina Entered By: Arnette NorrisBiell, Kristina on 05/22/2019 14:01:06 Pounds, Texanna M. ([id number]) -------------------------------------------------------------------------------- Wound Assessment Details Patient Name: [name] MastersWADE, Tika M. Date of Service: 05/22/2019 1:45 PM Medical Record Number: [id number] Patient Account Number: [id number] Date of Birth/Sex: 1965/09/14 (53 y.o. F) Treating RN: Arnette NorrisBiell, Kristina Primary Care Shavon Ashmore: [place], [place]ULIE Other Clinician:  Referring Lekha Dancer: [place], [place] Treating Krystan Northrop/Extender: STONE III, HOYT Weeks in Treatment: 6 Wound Status Wound Number: 7 Primary Pressure Ulcer Etiology: Wound Location: Right Trochanter Wound Open Wounding Event: Gradually Appeared Status: Date Acquired: 04/02/2018 Comorbid Asthma, Chronic Obstructive Pulmonary Weeks Of Treatment: 6 History: Disease (COPD), History of pressure wounds, Clustered Wound: No Paraplegia Photos Wound Measurements Length: (cm) 5.8 % Reduction in A Width: (cm) 3.8 % Reduction in V Depth: (cm) 0.6 Epithelializatio Area: (cm) 17.31 Tunneling: Volume: (cm) 10.386 Undermining: Location 1 Starting P Ending Pos Maximum Di Location 2 Starting P Ending Pos Maximum Di rea: -10.2% olume: 26.5% n: Small (1-33%) No Yes osition (o'clock): 7 ition (o'clock): 9 stance: (cm) 0.9 osition (o'clock): 12 ition (o'clock): 3 stance: (cm) 1.2 Wound Description Classification: Category/Stage III Foul Odor After Wound Margin: Flat and Intact Slough/Fibrino Exudate Amount: Medium Exudate Type: Serous Exudate Color: amber Cleansing: No No Wound Bed Granulation Amount: Medium (34-66%) Exposed Structure Wiltrout, Cayenne M. ([id number]) Granulation Quality: Red Fascia Exposed: No Necrotic Amount: Medium (34-66%) Fat Layer (Subcutaneous Tissue) Exposed: Yes Necrotic Quality: Eschar Tendon Exposed: No Muscle Exposed: No Joint Exposed: No Bone Exposed:  No Treatment Notes Wound #7 (Right Trochanter) Notes silver cel, BFD Electronic Signature(s) Signed: 05/22/2019 4:30:22 PM By: Arnette Norris Entered By: Arnette Norris on 05/22/2019 14:01:51 Sivertsen, [name] ([id number]) -------------------------------------------------------------------------------- Vitals Details Patient Name: [name] Date of Service: 05/22/2019 1:45 PM Medical Record Number: [id number] Patient Account Number: [id number] Date of Birth/Sex: [birth year] (53 y.o. F) Treating RN: Arnette Norris Primary Care Shaylynn Nulty: [place], [place] Other Clinician: Referring Marshia Tropea: [place], [place] Treating Cailee Blanke/Extender: STONE III, HOYT Weeks in Treatment: 6 Vital Signs Time Taken: 13:48 Temperature (F): 98.8 Height (in): 63 Pulse (bpm): 79 Weight (lbs): 70 Respiratory Rate (breaths/min): 18 Body Mass Index (BMI): 12.4 Blood Pressure (mmHg): 118/67 Reference Range: 80 - 120 mg / dl Electronic Signature(s) Signed: 05/22/2019 4:30:22 PM By: Arnette Norris Entered By: Arnette Norris on 05/22/2019 13:51:34

## 2019-06-06 ENCOUNTER — Encounter: Payer: Medicare Other | Attending: Physician Assistant | Admitting: Physician Assistant

## 2019-06-06 ENCOUNTER — Other Ambulatory Visit: Payer: Self-pay

## 2019-06-06 DIAGNOSIS — F17210 Nicotine dependence, cigarettes, uncomplicated: Secondary | ICD-10-CM | POA: Diagnosis not present

## 2019-06-06 DIAGNOSIS — I252 Old myocardial infarction: Secondary | ICD-10-CM | POA: Diagnosis not present

## 2019-06-06 DIAGNOSIS — J449 Chronic obstructive pulmonary disease, unspecified: Secondary | ICD-10-CM | POA: Diagnosis not present

## 2019-06-06 DIAGNOSIS — L89313 Pressure ulcer of right buttock, stage 3: Secondary | ICD-10-CM | POA: Insufficient documentation

## 2019-06-06 DIAGNOSIS — I739 Peripheral vascular disease, unspecified: Secondary | ICD-10-CM | POA: Insufficient documentation

## 2019-06-06 DIAGNOSIS — E440 Moderate protein-calorie malnutrition: Secondary | ICD-10-CM | POA: Insufficient documentation

## 2019-06-06 DIAGNOSIS — L89213 Pressure ulcer of right hip, stage 3: Secondary | ICD-10-CM | POA: Insufficient documentation

## 2019-06-06 DIAGNOSIS — G8252 Quadriplegia, C1-C4 incomplete: Secondary | ICD-10-CM | POA: Insufficient documentation

## 2019-06-06 DIAGNOSIS — I10 Essential (primary) hypertension: Secondary | ICD-10-CM | POA: Insufficient documentation

## 2019-06-06 DIAGNOSIS — Z936 Other artificial openings of urinary tract status: Secondary | ICD-10-CM | POA: Diagnosis not present

## 2019-06-06 DIAGNOSIS — L89323 Pressure ulcer of left buttock, stage 3: Secondary | ICD-10-CM | POA: Insufficient documentation

## 2019-06-06 NOTE — Progress Notes (Signed)
MCKYNZI, CAMMON ([id number]) Visit Report for 06/06/2019 Arrival Information Details Patient Name: [name], [name] Date of Service: 06/06/2019 12:30 PM Medical Record Number: [id number] Patient Account Number: [id number] Date of Birth/Sex: 1966/04/01 (53 y.o. F) Treating RN: Montey Hora Primary Care Pansie Guggisberg: [place], [place]ULIE Other Clinician: Referring Auria Mckinlay: [place], [place]ULIE Treating Jacobo Moncrief/Extender: Melburn Hake, HOYT Weeks in Treatment: 8 Visit Information History Since Last Visit Added or deleted any medications: No Patient Arrived: Wheel Chair Has Dressing in Place as Prescribed: Yes Arrival Time: 12:38 Pain Present Now: No Accompanied By: friend Transfer Assistance: Manual Patient Identification Verified: Yes Secondary Verification Process Completed: Yes Patient Requires Transmission-Based No Precautions: Patient Has Alerts: No Electronic Signature(s) Signed: 06/06/2019 3:46:50 PM By: Lorine Bears RCP, RRT, CHT Entered By: Lorine Bears on 06/06/2019 12:39:57 Kammerer, [name] ([id number]) -------------------------------------------------------------------------------- Clinic Level of Care Assessment Details Patient Name: [name], [name] Date of Service: 06/06/2019 12:30 PM Medical Record Number: [id number] Patient Account Number: [id number] Date of Birth/Sex: [birth year] (53 y.o. F) Treating RN: Montey Hora Primary Care Joell Buerger: [place], JULIE Other Clinician: Referring Tytan Sandate: [place], [place] Treating Delmar Dondero/Extender: Melburn Hake, HOYT Weeks in Treatment: 8 Clinic Level of Care Assessment Items TOOL 4 Quantity Score []  - Use when only an EandM is performed on FOLLOW-UP visit 0 ASSESSMENTS - Nursing Assessment / Reassessment X - Reassessment of Co-morbidities (includes updates in patient status) 1 10 X- 1 5 Reassessment of Adherence to Treatment Plan ASSESSMENTS - Wound and Skin Assessment / Reassessment []  - Simple Wound  Assessment / Reassessment - one wound 0 X- 3 5 Complex Wound Assessment / Reassessment - multiple wounds []  - 0 Dermatologic / Skin Assessment (not related to wound area) ASSESSMENTS - Focused Assessment []  - Circumferential Edema Measurements - multi extremities 0 []  - 0 Nutritional Assessment / Counseling / Intervention []  - 0 Lower Extremity Assessment (monofilament, tuning fork, pulses) []  - 0 Peripheral Arterial Disease Assessment (using hand held doppler) ASSESSMENTS - Ostomy and/or Continence Assessment and Care []  - Incontinence Assessment and Management 0 []  - 0 Ostomy Care Assessment and Management (repouching, etc.) PROCESS - Coordination of Care X - Simple Patient / Family Education for ongoing care 1 15 []  - 0 Complex (extensive) Patient / Family Education for ongoing care X- 1 10 Staff obtains Programmer, systems, Records, Test Results / Process Orders []  - 0 Staff telephones HHA, Nursing Homes / Clarify orders / etc []  - 0 Routine Transfer to another Facility (non-emergent condition) []  - 0 Routine Hospital Admission (non-emergent condition) []  - 0 New Admissions / Biomedical engineer / Ordering NPWT, Apligraf, etc. []  - 0 Emergency Hospital Admission (emergent condition) X- 1 10 Simple Discharge Coordination MADYLYN, [employer]. ([id number]) []  - 0 Complex (extensive) Discharge Coordination PROCESS - Special Needs []  - Pediatric / Minor Patient Management 0 []  - 0 Isolation Patient Management []  - 0 Hearing / Language / Visual special needs []  - 0 Assessment of Community assistance (transportation, D/C planning, etc.) []  - 0 Additional assistance / Altered mentation []  - 0 Support Surface(s) Assessment (bed, cushion, seat, etc.) INTERVENTIONS - Wound Cleansing / Measurement []  - Simple Wound Cleansing - one wound 0 X- 3 5 Complex Wound Cleansing - multiple wounds X- 1 5 Wound Imaging (photographs - any number of wounds) []  - 0 Wound Tracing (instead of  photographs) []  - 0 Simple Wound Measurement - one wound X- 3 5 Complex Wound Measurement - multiple wounds INTERVENTIONS - Wound Dressings X - Small Wound Dressing one or multiple wounds  3 10 []  - 0 Medium Wound Dressing one or multiple wounds []  - 0 Large Wound Dressing one or multiple wounds []  - 0 Application of Medications - topical []  - 0 Application of Medications - injection INTERVENTIONS - Miscellaneous []  - External ear exam 0 []  - 0 Specimen Collection (cultures, biopsies, blood, body fluids, etc.) []  - 0 Specimen(s) / Culture(s) sent or taken to Lab for analysis []  - 0 Patient Transfer (multiple staff / Nurse, adultHoyer Lift / Similar devices) []  - 0 Simple Staple / Suture removal (25 or less) []  - 0 Complex Staple / Suture removal (26 or more) []  - 0 Hypo / Hyperglycemic Management (close monitor of Blood Glucose) []  - 0 Ankle / Brachial Index (ABI) - do not check if billed separately X- 1 5 Vital Signs Kinyon, Thara M. ([id number]) Has the patient been seen at the hospital within the last three years: Yes Total Score: 135 Level Of Care: New/Established - Level 4 Electronic Signature(s) Signed: 06/06/2019 5:08:51 PM By: Curtis Sitesorthy, Joanna Entered By: Curtis Sitesorthy, Joanna on 06/06/2019 13:22:52 Micheals, [name] ([id number]) -------------------------------------------------------------------------------- Encounter Discharge Information Details Patient Name: [name] MastersWADE, [name] Date of Service: 06/06/2019 12:30 PM Medical Record Number: [id number] Patient Account Number: [id number] Date of Birth/Sex: Mar 31, 1966 (53 y.o. F) Treating RN: Curtis Sitesorthy, Joanna Primary Care Jameca Chumley: [place], [place] Other Clinician: Referring Hamda Klutts: [place], [place] Treating Sencere Symonette/Extender: Linwood DibblesSTONE III, HOYT Weeks in Treatment: 8 Encounter Discharge Information Items Discharge Condition: Stable Ambulatory Status: Wheelchair Discharge Destination: Home Transportation: Private Auto Accompanied By:  spouse Schedule Follow-up Appointment: Yes Clinical Summary of Care: Electronic Signature(s) Signed: 06/06/2019 5:08:51 PM By: Curtis Sitesorthy, Joanna Entered By: Curtis Sitesorthy, Joanna on 06/06/2019 13:23:50 Stroschein, [name] ([id number]) -------------------------------------------------------------------------------- Lower Extremity Assessment Details Patient Name: [name] MastersWADE, Angelita M. Date of Service: 06/06/2019 12:30 PM Medical Record Number: [id number] Patient Account Number: [id number] Date of Birth/Sex: Mar 31, 1966 (53 y.o. F) Treating RN: Arnette NorrisBiell, Kristina Primary Care Jayden Rudge: [place], [place]UL[place] Other Clinician: Referring Reginna Sermeno: [place], [place] Treating Jenya Putz/Extender: Linwood DibblesSTONE III, HOYT Weeks in Treatment: 8 Electronic Signature(s) Signed: 06/06/2019 4:16:49 PM By: Arnette NorrisBiell, Kristina Entered By: Arnette NorrisBiell, Kristina on 06/06/2019 13:01:00 Shimabukuro, [name] ([id number]) -------------------------------------------------------------------------------- Multi Wound Chart Details Patient Name: [name] MastersWADE, Kathren M. Date of Service: 06/06/2019 12:30 PM Medical Record Number: [id number] Patient Account Number: [id number] Date of Birth/Sex: Mar 31, 1966 (53 y.o. F) Treating RN: Curtis Sitesorthy, Joanna Primary Care Kilan Banfill: [place], [place]UL[place] Other Clinician: Referring Dallana Mavity: [place], [place]ULIE Treating Savan Ruta/Extender: STONE III, HOYT Weeks in Treatment: 8 Vital Signs Height(in): 63 Pulse(bpm): 67 Weight(lbs): 70 Blood Pressure(mmHg): 114/72 Body Mass Index(BMI): 12 Temperature(F): 98.8 Respiratory Rate 18 (breaths/min): Photos: Wound Location: Right Gluteus Right Sacrum Right Trochanter Wounding Event: Gradually Appeared Pressure Injury Gradually Appeared Primary Etiology: Pressure Ulcer Pressure Ulcer Pressure Ulcer Comorbid History: Asthma, Chronic Obstructive Asthma, Chronic Obstructive Asthma, Chronic Obstructive Pulmonary Disease (COPD), Pulmonary Disease (COPD), Pulmonary Disease (COPD), History of pressure wounds,  History of pressure wounds, History of pressure wounds, Paraplegia Paraplegia Paraplegia Date Acquired: 10/16/2018 10/16/2018 04/02/2018 Weeks of Treatment: 8 8 8  Wound Status: Open Open Open Clustered Wound: Yes No No Clustered Quantity: 3 N/A N/A Measurements L x W x D 4x5.2x0.1 4.3x13x0.1 5.5x3.5x0.6 (cm) Area (cm) : 16.336 43.904 15.119 Volume (cm) : 1.634 4.39 9.071 % Reduction in Area: 65.30% -37106.80% 3.70% % Reduction in Volume: 95.00% -36483.30% 35.80% Starting Position 1 7 (o'clock): Ending Position 1 9 (o'clock): Maximum Distance 1 (cm): 0.7 Starting Position 2 12 (o'clock): Ending Position 2 3 (o'clock): Maximum Distance 2 (cm): 1.6 Undermining: No No Yes Haynes, Haidynn M. (  [id number]) Classification: Category/Stage III Category/Stage III Category/Stage III Exudate Amount: Large Medium Medium Exudate Type: Serous Serous Serous Exudate Color: amber amber amber Wound Margin: Flat and Intact Flat and Intact Flat and Intact Granulation Amount: Medium (34-66%) Medium (34-66%) Medium (34-66%) Granulation Quality: Red Red Red Necrotic Amount: Medium (34-66%) Medium (34-66%) Medium (34-66%) Necrotic Tissue: Eschar, Adherent Slough Eschar Eschar Exposed Structures: Fat Layer (Subcutaneous Fat Layer (Subcutaneous Fat Layer (Subcutaneous Tissue) Exposed: Yes Tissue) Exposed: Yes Tissue) Exposed: Yes Fascia: No Fascia: No Fascia: No Tendon: No Tendon: No Tendon: No Muscle: No Muscle: No Muscle: No Joint: No Joint: No Joint: No Bone: No Bone: No Bone: No Epithelialization: Small (1-33%) Small (1-33%) Small (1-33%) Treatment Notes Electronic Signature(s) Signed: 06/06/2019 5:08:51 PM By: Curtis Sites Entered By: Curtis Sites on 06/06/2019 13:14:56 Volpi, [name] ([id number]) -------------------------------------------------------------------------------- Multi-Disciplinary Care Plan Details Patient Name: PATTIE, [name]. Date of Service: 06/06/2019  12:30 PM Medical Record Number: [id number] Patient Account Number: [id number] Date of Birth/Sex: 1966-05-21 (53 y.o. F) Treating RN: Curtis Sites Primary Care Igor Bishop: [place], [place]ULIE Other Clinician: Referring Shalene Gallen: [place], [place] Treating Christ Fullenwider/Extender: Linwood Dibbles, HOYT Weeks in Treatment: 8 Active Inactive Abuse / Safety / Falls / Self Care Management Nursing Diagnoses: Impaired physical mobility Goals: Patient will not develop complications from immobility Date Initiated: 04/08/2019 Target Resolution Date: 07/12/2019 Goal Status: Active Interventions: Assess fall risk on admission and as needed Notes: Nutrition Nursing Diagnoses: Potential for alteratiion in Nutrition/Potential for imbalanced nutrition Goals: Patient/caregiver agrees to and verbalizes understanding of need to use nutritional supplements and/or vitamins as prescribed Date Initiated: 04/08/2019 Target Resolution Date: 07/12/2019 Goal Status: Active Interventions: Assess patient nutrition upon admission and as needed per policy Notes: Orientation to the Wound Care Program Nursing Diagnoses: Knowledge deficit related to the wound healing center program Goals: Patient/caregiver will verbalize understanding of the Wound Healing Center Program Date Initiated: 04/08/2019 Target Resolution Date: 07/12/2019 Goal Status: Active Interventions: Provide education on orientation to the wound center KHRYSTYNE, ARPIN. ([id number]) Notes: Pressure Nursing Diagnoses: Knowledge deficit related to management of pressures ulcers Goals: Patient will remain free of pressure ulcers Date Initiated: 04/08/2019 Target Resolution Date: 07/12/2019 Goal Status: Active Interventions: Assess potential for pressure ulcer upon admission and as needed Notes: Wound/Skin Impairment Nursing Diagnoses: Impaired tissue integrity Goals: Ulcer/skin breakdown will heal within 14 weeks Date Initiated: 04/08/2019 Target Resolution Date:  07/12/2019 Goal Status: Active Interventions: Assess patient/caregiver ability to obtain necessary supplies Assess patient/caregiver ability to perform ulcer/skin care regimen upon admission and as needed Assess ulceration(s) every visit Notes: Electronic Signature(s) Signed: 06/06/2019 5:08:51 PM By: Curtis Sites Entered By: Curtis Sites on 06/06/2019 13:14:45 Xiong, Meryem M. ([id number]) -------------------------------------------------------------------------------- Pain Assessment Details Patient Name: [name] Masters. Date of Service: 06/06/2019 12:30 PM Medical Record Number: [id number] Patient Account Number: [id number] Date of Birth/Sex: 1966/04/26 (53 y.o. F) Treating RN: Curtis Sites Primary Care Gabbrielle Mcnicholas: [place], [place]IE Other Clinician: Referring Madoline Bhatt: [place], [place] Treating Natacha Jepsen/Extender: STONE III, HOYT Weeks in Treatment: 8 Active Problems Location of Pain Severity and Description of Pain Patient Has Paino No Site Locations Pain Management and Medication Current Pain Management: Electronic Signature(s) Signed: 06/06/2019 3:46:50 PM By: Sallee Provencal, RRT, CHT Signed: 06/06/2019 5:08:51 PM By: Curtis Sites Entered By: Dayton Martes on 06/06/2019 12:40:04 [place], [name] ([id number]) -------------------------------------------------------------------------------- Patient/Caregiver Education Details Patient Name: VANNIA, POLA. Date of Service: 06/06/2019 12:30 PM Medical Record Number: [id number] Patient Account Number: [id number] Date of Birth/Gender: 1966/06/08 (53 y.o. F) Treating RN: Curtis Sites Primary  Care Physician: [place], [place]ULIE Other Clinician: Referring Physician: [place], [place] Treating Physician/Extender: Skeet Simmer in Treatment: 8 Education Assessment Education Provided To: Patient and Caregiver Education Topics Provided Wound/Skin Impairment: Handouts: Other: wound care as  ordered Methods: Demonstration, Explain/Verbal Responses: State content correctly Electronic Signature(s) Signed: 06/06/2019 5:08:51 PM By: Curtis Sites Entered By: Curtis Sites on 06/06/2019 13:23:12 Weimann, Anushree M. ([id number]) -------------------------------------------------------------------------------- Wound Assessment Details Patient Name: [name] Masters. Date of Service: 06/06/2019 12:30 PM Medical Record Number: [id number] Patient Account Number: [id number] Date of Birth/Sex: 1966-04-14 (53 y.o. F) Treating RN: Arnette Norris Primary Care Jaecion Dempster: [place], [place]ULIE Other Clinician: Referring Jae Bruck: [place], [place] Treating Madisun Hargrove/Extender: STONE III, HOYT Weeks in Treatment: 8 Wound Status Wound Number: 5 Primary Pressure Ulcer Etiology: Wound Location: Right Gluteus Wound Open Wounding Event: Gradually Appeared Status: Date Acquired: 10/16/2018 Comorbid Asthma, Chronic Obstructive Pulmonary Weeks Of Treatment: 8 History: Disease (COPD), History of pressure wounds, Clustered Wound: Yes Paraplegia Photos Wound Measurements Length: (cm) 4 % Reduction i Width: (cm) 5.2 % Reduction i Depth: (cm) 0.1 Epithelializa Clustered Quantity: 3 Tunneling: Area: (cm) 16.336 Undermining: Volume: (cm) 1.634 n Area: 65.3% n Volume: 95% tion: Small (1-33%) No No Wound Description Classification: Category/Stage III Foul Odor Aft Wound Margin: Flat and Intact Slough/Fibrin Exudate Amount: Large Exudate Type: Serous Exudate Color: amber er Cleansing: No o Yes Wound Bed Granulation Amount: Medium (34-66%) Exposed Structure Granulation Quality: Red Fascia Exposed: No Necrotic Amount: Medium (34-66%) Fat Layer (Subcutaneous Tissue) Exposed: Yes Necrotic Quality: Eschar, Adherent Slough Tendon Exposed: No Muscle Exposed: No Joint Exposed: No Bone Exposed: No Hupp, Tajuana M. ([id number]) Treatment Notes Wound #5 (Right Gluteus) Notes silver cel,  BFD Electronic Signature(s) Signed: 06/06/2019 4:16:49 PM By: Arnette Norris Entered By: Arnette Norris on 06/06/2019 12:59:32 Stines, Jurnee Judie Petit ([id number]) -------------------------------------------------------------------------------- Wound Assessment Details Patient Name: [name] Masters. Date of Service: 06/06/2019 12:30 PM Medical Record Number: [id number] Patient Account Number: [id number] Date of Birth/Sex: [birth year] (53 y.o. F) Treating RN: Arnette Norris Primary Care Meilani Edmundson: [place], [place]IE Other Clinician: Referring Wing Gfeller: [place], [place] Treating Tecia Cinnamon/Extender: STONE III, HOYT Weeks in Treatment: 8 Wound Status Wound Number: 6 Primary Pressure Ulcer Etiology: Wound Location: Right Sacrum Wound Open Wounding Event: Pressure Injury Status: Date Acquired: 10/16/2018 Comorbid Asthma, Chronic Obstructive Pulmonary Weeks Of Treatment: 8 History: Disease (COPD), History of pressure wounds, Clustered Wound: No Paraplegia Photos Wound Measurements Length: (cm) 4.3 % Reduction in Width: (cm) 13 % Reduction in Depth: (cm) 0.1 Epithelializati Area: (cm) 43.904 Tunneling: Volume: (cm) 4.39 Undermining: Area: -37106.8% Volume: -36483.3% on: Small (1-33%) No No Wound Description Classification: Category/Stage III Foul Odor After Wound Margin: Flat and Intact Slough/Fibrino Exudate Amount: Medium Exudate Type: Serous Exudate Color: amber Cleansing: No No Wound Bed Granulation Amount: Medium (34-66%) Exposed Structure Granulation Quality: Red Fascia Exposed: No Necrotic Amount: Medium (34-66%) Fat Layer (Subcutaneous Tissue) Exposed: Yes Necrotic Quality: Eschar Tendon Exposed: No Muscle Exposed: No Joint Exposed: No Bone Exposed: No Treatment Notes Rabadan, Baelynn M. ([id number]) Wound #6 (Right Sacrum) Notes silver cel, BFD Electronic Signature(s) Signed: 06/06/2019 4:16:49 PM By: Arnette Norris Entered By: Arnette Norris on 06/06/2019  13:00:00 Deamer, [name] ([id number]) -------------------------------------------------------------------------------- Wound Assessment Details Patient Name: [name] Masters. Date of Service: 06/06/2019 12:30 PM Medical Record Number: [id number] Patient Account Number: [id number] Date of Birth/Sex: 30-Jul-1965 (53 y.o. F) Treating RN: Arnette Norris Primary Care Chauncey Sciulli: [place], [place] Other Clinician: Referring Kalysta Kneisley: [place], [place] Treating Shawnda Mauney/Extender: STONE III, HOYT Weeks in Treatment: 8 Wound Status Wound Number: 7  Primary Pressure Ulcer Etiology: Wound Location: Right Trochanter Wound Open Wounding Event: Gradually Appeared Status: Date Acquired: 04/02/2018 Comorbid Asthma, Chronic Obstructive Pulmonary Weeks Of Treatment: 8 History: Disease (COPD), History of pressure wounds, Clustered Wound: No Paraplegia Photos Wound Measurements Length: (cm) 5.5 % Reduction in A Width: (cm) 3.5 % Reduction in V Depth: (cm) 0.6 Epithelializatio Area: (cm) 15.119 Tunneling: Volume: (cm) 9.071 Undermining: Location 1 Starting P Ending Pos Maximum Di Location 2 Starting P Ending Pos Maximum Di rea: 3.7% olume: 35.8% n: Small (1-33%) No Yes osition (o'clock): 7 ition (o'clock): 9 stance: (cm) 0.7 osition (o'clock): 12 ition (o'clock): 3 stance: (cm) 1.6 Wound Description Classification: Category/Stage III Foul Odor After Wound Margin: Flat and Intact Slough/Fibrino Exudate Amount: Medium Exudate Type: Serous Exudate Color: amber Cleansing: No No Wound Bed Granulation Amount: Medium (34-66%) Exposed Structure Grizzle, Elaine M. ([id number]) Granulation Quality: Red Fascia Exposed: No Necrotic Amount: Medium (34-66%) Fat Layer (Subcutaneous Tissue) Exposed: Yes Necrotic Quality: Eschar Tendon Exposed: No Muscle Exposed: No Joint Exposed: No Bone Exposed: No Treatment Notes Wound #7 (Right Trochanter) Notes silver cel, BFD Electronic  Signature(s) Signed: 06/06/2019 4:16:49 PM By: Arnette Norris Entered By: Arnette Norris on 06/06/2019 13:00:46 [place], Nakeshia Judie Petit ([id number]) -------------------------------------------------------------------------------- Vitals Details Patient Name: [name] Masters. Date of Service: 06/06/2019 12:30 PM Medical Record Number: [id number] Patient Account Number: [id number] Date of Birth/Sex: [birth year] (53 y.o. F) Treating RN: Curtis Sites Primary Care Akeya Ryther: [place], [place]IE Other Clinician: Referring Parry Po: [place], [place] Treating Johneisha Broaden/Extender: STONE III, HOYT Weeks in Treatment: 8 Vital Signs Time Taken: 12:35 Temperature (F): 98.8 Height (in): 63 Pulse (bpm): 67 Weight (lbs): 70 Respiratory Rate (breaths/min): 18 Body Mass Index (BMI): 12.4 Blood Pressure (mmHg): 114/72 Reference Range: 80 - 120 mg / dl Electronic Signature(s) Signed: 06/06/2019 3:46:50 PM By: Dayton Martes RCP, RRT, CHT Entered By: Dayton Martes on 06/06/2019 12:40:34

## 2019-06-06 NOTE — Progress Notes (Addendum)
[name], [name] ([id number]) Visit Report for 06/06/2019 Chief Complaint Document Details Patient Name: [name], [name] Date of Service: 06/06/2019 12:30 PM Medical Record Number: [id number] Patient Account Number: [id number] Date of Birth/Sex: 1965/12/17 (53 y.o. F) Treating RN: Curtis Sites Primary Care Provider: [place], JULIE Other Clinician: Referring Provider: [place], JULIE Treating Provider/Extender: Linwood Dibbles, Delailah Spieth Weeks in Treatment: 8 Information Obtained from: Patient Chief Complaint Bilateral gluteal and right hip pressure ulcers Electronic Signature(s) Signed: 06/06/2019 12:53:39 PM By: Lenda Kelp PA-C Entered By: Lenda Kelp on 06/06/2019 12:53:38 [name], [name] -------------------------------------------------------------------------------- HPI Details Patient Name: [name], [name] Date of Service: 06/06/2019 12:30 PM Medical Record Number: [id number] Patient Account Number: [id number] Date of Birth/Sex: 1966/02/23 (53 y.o. F) Treating RN: Curtis Sites Primary Care Provider: [place], JULIE Other Clinician: Referring Provider: [place], JULIE Treating Provider/Extender: Linwood Dibbles, Courtlyn Aki Weeks in Treatment: 8 History of Present Illness HPI Description: 53 year old patient who was been an unreliable historian says she had completely healed in February and most recently her home health noticed that the wound on the right scapular area had reopened. She was in hospital about a month ago with pneumonia at that time a workup was done. We have no notes today but will workup the electronic medical record. on review of her records electronically I do not find any recent hospital MRI in the last 6 months. She had a CT scan of the chest in February 2018 which does not show any osseous lesions. she was admitted to the hospital between May 14 and May 17 for 3 days for mucus plugging and collapse of the left lung. at that time she had a decubitus ulcer of  the right scapular region stage III. her history was noted to have quadriplegia C5-C7 incomplete, neurogenic bladder, nephrostomy catheter and emphysema. patient tells me that she has not smoked for last month and a half but as per her hospital records she was smoking in the middle of May. 01/12/17 patient scapular region appears to be doing fairly well on evaluation today. She is having really no syndicate discomfort and has been tolerating the dressing changes well complication. Readmission: 04/08/2019 patient presents today for reevaluation in our clinic concerning issues she is having with wounds on the bilateral gluteal area as well as on the right trochanter. She has unfortunately noted that things seem to have gotten worse as she was using the Clinitron bed. The patient states it gives off a lot of heat and subsequently has not been good for her skin causing her to breakdown more. She has been trying to get back just to the air mattress but has not been able to get the company to come pick up that bed and bring out the air mattress. Her primary care provider is on this as well trying to get them to do so. Unfortunately her wounds are significantly large and show signs of hyper granulation as well. She has had this kind of issue for quite some time and unfortunately just does not seem to be showing signs of significant improvement with what she has been using currently. No fevers, chills, nausea, vomiting, or diarrhea. She does attempt appropriate offloading and again has been using the bed because that is all she has although she is not happy with it. 04/22/2019 on evaluation today patient appears to be doing about the same with regard to her wounds at this time. Fortunately there does not appear to be any signs of active infection at this point which  is good news. She has been tolerating the dressing changes without complication. Overall I am pleased with that with that being said  unfortunately she does have a little bit of more significant breakdown with regard to the wound bed upon evaluation today. I believe this is secondary to the fact that she has been lying potentially too much in a single position for too long over the past several weeks. I think that potentially if she were to mitigate this to a degree that things may improve somewhat. With that being said she is really not happy with her current bed she is attempted to have them come out and switch this back to an air mattress but unfortunately has not been successful in getting them to come out and make this change. She is very frustrated with that. She still has the Clinitron bed and she much prefers just having the standard air mattress. Patient's primary care provider still working on this she tells me. 05/08/2019 on evaluation today patient unfortunately is not doing as well as I would like to see in regard to her wounds. She actually is measuring a little bit larger pretty much at all wound locations unfortunately and overall I am very upset as well with the fact that she has been trying to get the Clinitron bed removed from her home but is not having any luck as such. We did get the number for the company today in order for us to see about calling them and switching this over to an air mattress as this is giving her a lot of trouble she states is really hot and she feels like it is breaking down her skin. This is not good. 05/22/2019 on evaluation today patient appears to be doing well with regard to the wounds she is showing some signs of slight improvement at this point. This is good news. Fortunately there is no evidence of active infection at this time which is also good news. No fevers, chills, nausea, vomiting, or diarrhea. She does tell me that in order to get rid of the Clinitron bed that she has been working on that the representative from [employer] stated they needed in order for me asked to  be [name] MastersWADE, [name] ([id number]) getting rid of it and then what we wanted her to have. Again I will be more than happy to do that we can initiate an air mattress for her. 06/06/2019 on evaluation today patient continues to have issues with her wounds. The good news is we figured out what to be done for the her bad as far as get not moved however she is getting actually ready to move and change residences. For that reason what they are going to do is come in and take the bed from the current residence to get rid of the Clinitron bed and then subsequently move the new bed into her new place of residence. This makes complete sense to me as well and I think that sounds like a good plan. At least we have a plan for what to do as far as getting things taken care of here. ======= Old notes 53 year old patient was had quadriparesis for the last 9 years and [facility] For about 4-5 months she's had a decubitus ulcer on the right scapular region. she was lost to follow-up for the last 6 months. she had a couple of small areas which have opened out in the region of  previous scar tissue in the sacral region but these haveall healed now. She does not have any other significant comorbidities. She has a good Roho cushion for her wheelchair and she has a specialized mattress for sleep. She has restarted smoking and smokes about half a pack of cigarettes a day 02/18/2015 -- X-ray of the pelvis -- IMPRESSION:No acute osseous injury of the pelvis. If there is clinical concern regarding osteomyelitis, recommend further evaluation with CT. X-ray of the sacrum and coccyx IMPRESSION:No acute osseous abnormality of the sacrum. If there is further clinical concern regarding osteomyelitis of the sacrum, further evaluation with CT is recommended. ========= Electronic Signature(s) Signed: 06/06/2019 1:26:46 PM By: Lenda Kelp PA-C Entered By:  Lenda Kelp on 06/06/2019 13:26:46 Chretien, [name] ([id number]) -------------------------------------------------------------------------------- Physical Exam Details Patient Name: PETULA, ROTOLO. Date of Service: 06/06/2019 12:30 PM Medical Record Number: [id number] Patient Account Number: [id number] Date of Birth/Sex: 12-03-1965 (53 y.o. F) Treating RN: Curtis Sites Primary Care Provider: [place], JULIE Other Clinician: Referring Provider: [place], JULIE Treating Provider/Extender: STONE III, Sayuri Rhames Weeks in Treatment: 8 Constitutional Well-nourished and well-hydrated in no acute distress. Respiratory normal breathing without difficulty. clear to auscultation bilaterally. Cardiovascular regular rate and rhythm with normal S1, S2. Psychiatric this patient is able to make decisions and demonstrates good insight into disease process. Alert and Oriented x 3. pleasant and cooperative. Notes His wound bed currently showed signs of good granulation at this time. Fortunately there is no evidence of active infection at this point. Her wounds really do not seem to be dramatically better compared to previous although there is not much slough buildup. However it does not look like that the appropriate alginate has been utilized at this point we specifically prescribed or recommended silver cell that is not what is being used currently. There does seem to be some confusion I did recommend that we give them the package today to take and show home health what exactly we are recommended. That will also obviously be on the orders. Electronic Signature(s) Signed: 06/06/2019 1:28:02 PM By: Lenda Kelp PA-C Entered By: Lenda Kelp on 06/06/2019 13:28:02 [name] Masters ([id number]) -------------------------------------------------------------------------------- Physician Orders Details Patient Name: JORDYNN, MARCELLA. Date of Service: 06/06/2019 12:30 PM Medical Record Number:  [id number] Patient Account Number: [id number] Date of Birth/Sex: February 07, 1966 (53 y.o. F) Treating RN: Curtis Sites Primary Care Provider: [place], JULIE Other Clinician: Referring Provider: [place], JULIE Treating Provider/Extender: Linwood Dibbles, Kermit Arnette Weeks in Treatment: 8 Verbal / Phone Orders: No Diagnosis Coding ICD-10 Coding Code Description L89.313 Pressure ulcer of right buttock, stage 3 L89.323 Pressure ulcer of left buttock, stage 3 L89.213 Pressure ulcer of right hip, stage 3 G82.52 Quadriplegia, C1-C4 incomplete E44.0 Moderate protein-calorie malnutrition Wound Cleansing Wound #5 Right Gluteus o Clean wound with Normal Saline. o May Shower, gently pat wound dry prior to applying new dressing. Wound #6 Right Sacrum o Clean wound with Normal Saline. o May Shower, gently pat wound dry prior to applying new dressing. Wound #7 Right Trochanter o Clean wound with Normal Saline. o May Shower, gently pat wound dry prior to applying new dressing. Primary Wound Dressing Wound #5 Right Gluteus o Silver Alginate - Silvercel Non Adherent specifically. Wet the dressing for removal during dressing change. Wound #6 Right Sacrum o Silver Alginate - Silvercel Non Adherent specifically. Wet the dressing for removal during dressing change. Wound #7 Right Trochanter o Silver Alginate - Silvercel Non Adherent specifically. Wet the dressing for removal during dressing change. Secondary Dressing  Wound #5 Right Gluteus o Boardered Foam Dressing Wound #6 Right Sacrum o Boardered Foam Dressing Wound #7 Right Trochanter o Boardered Foam Dressing Dressing Change Frequency BRISTOL, OSENTOSKI. ([id number]) Wound #5 Right Gluteus o Change Dressing Monday, Wednesday, Friday - or three times weekly Wound #6 Right Sacrum o Change Dressing Monday, Wednesday, Friday - or three times weekly Wound #7 Right Trochanter o Change Dressing Monday, Wednesday, Friday - or three times  weekly Follow-up Appointments o Return Appointment in 2 weeks. Off-Loading Wound #5 Right Gluteus o Mattress - air mattress or fluidized air mattress o Turn and reposition every 2 hours Wound #6 Right Sacrum o Mattress - air mattress or fluidized air mattress o Turn and reposition every 2 hours Wound #7 Right Trochanter o Mattress - air mattress or fluidized air mattress o Turn and reposition every 2 hours Additional Orders / Instructions Wound #5 Right Gluteus o Increase protein intake. Wound #6 Right Sacrum o Increase protein intake. Wound #7 Right Trochanter o Increase protein intake. Home Health Wound #5 Right Gluteus o Continue Home Health Visits - [facility] - Please provide dressing materials for patient o Home Health Nurse may visit PRN to address patientos wound care needs. o FACE TO FACE ENCOUNTER: MEDICARE and MEDICAID PATIENTS: I certify that this patient is under my care and that I had a face-to-face encounter that meets the physician face-to-face encounter requirements with this patient on this date. The encounter with the patient was in whole or in part for the following MEDICAL CONDITION: (primary reason for Home Healthcare) MEDICAL NECESSITY: I certify, that based on my findings, NURSING services are a medically necessary home health service. HOME BOUND STATUS: I certify that my clinical findings support that this patient is homebound (i.e., Due to illness or injury, pt requires aid of supportive devices such as crutches, cane, wheelchairs, walkers, the use of special transportation or the assistance of another person to leave their place of residence. There is a normal inability to leave the home and doing so requires considerable and taxing effort. Other absences are for medical reasons / religious services and are infrequent or of short duration when for other reasons). o If current dressing causes regression in wound condition, may D/C  ordered dressing product/s and apply Normal Saline Moist Dressing daily until next [facility]aling Center / Other MD appointment. Notify Wound Healing Center of regression in wound condition at [phone]. o Please direct any NON-WOUND related issues/requests for orders to patient's Primary Care Physician Wound #6 Right Sacrum o Continue Home Health Visits - [facility] - Please provide dressing materials for patient o Home Health Nurse may visit PRN to address patientos wound care needs. JALAYNE, GANESH ([id number]) o FACE TO FACE ENCOUNTER: MEDICARE and MEDICAID PATIENTS: I certify that this patient is under my care and that I had a face-to-face encounter that meets the physician face-to-face encounter requirements with this patient on this date. The encounter with the patient was in whole or in part for the following MEDICAL CONDITION: (primary reason for Home Healthcare) MEDICAL NECESSITY: I certify, that based on my findings, NURSING services are a medically necessary home health service. HOME BOUND STATUS: I certify that my clinical findings support that this patient is homebound (i.e., Due to illness or injury, pt requires aid of supportive devices such as crutches, cane, wheelchairs, walkers, the use of special transportation or the assistance of another person to leave their place of residence. There is a normal inability to leave the home and doing  so requires considerable and taxing effort. Other absences are for medical reasons / religious services and are infrequent or of short duration when for other reasons). o If current dressing causes regression in wound condition, may D/C ordered dressing product/s and apply Normal Saline Moist Dressing daily until next [place] / Other MD appointment. [place] of regression in wound condition at [phone]. o Please direct any NON-WOUND related issues/requests for orders to patient's Primary Care  Physician Wound #7 Right [place] Visits - [facility] - Please provide dressing materials for patient o Home Health Nurse may visit PRN to address patientos wound care needs. o FACE TO FACE ENCOUNTER: MEDICARE and MEDICAID PATIENTS: I certify that this patient is under my care and that I had a face-to-face encounter that meets the physician face-to-face encounter requirements with this patient on this date. The encounter with the patient was in whole or in part for the following MEDICAL CONDITION: (primary reason for [place]) MEDICAL NECESSITY: I certify, that based on my findings, NURSING services are a medically necessary home health service. HOME BOUND STATUS: I certify that my clinical findings support that this patient is homebound (i.e., Due to illness or injury, pt requires aid of supportive devices such as crutches, cane, wheelchairs, walkers, the use of special transportation or the assistance of another person to leave their place of residence. There is a normal inability to leave the home and doing so requires considerable and taxing effort. Other absences are for medical reasons / religious services and are infrequent or of short duration when for other reasons). o If current dressing causes regression in wound condition, may D/C ordered dressing product/s and apply Normal Saline Moist Dressing daily until next [place] / Other MD appointment. [place] of regression in wound condition at [phone]. o Please direct any NON-WOUND related issues/requests for orders to patient's Primary Care Physician Electronic Signature(s) Signed: 06/06/2019 5:08:51 PM By: Montey Hora Signed: 06/07/2019 11:37:39 PM By: Worthy Keeler PA-C Entered By: Montey Hora on 06/06/2019 13:22:07 Tallo, [name] ([id number]) -------------------------------------------------------------------------------- Problem List  Details Patient Name: SHANTAVIA, JHA. Date of Service: 06/06/2019 12:30 PM Medical Record Number: [id number] Patient Account Number: [id number] Date of Birth/Sex: 07-12-1965 (53 y.o. F) Treating RN: Montey Hora Primary Care Provider: [place], JULIE Other Clinician: Referring Provider: [place], JULIE Treating Provider/Extender: Melburn Hake, Kenia Teagarden Weeks in Treatment: 8 Active Problems ICD-10 Evaluated Encounter Code Description Active Date Today Diagnosis L89.313 Pressure ulcer of right buttock, stage 3 04/08/2019 No Yes L89.323 Pressure ulcer of left buttock, stage 3 04/08/2019 No Yes L89.213 Pressure ulcer of right hip, stage 3 04/08/2019 No Yes G82.52 Quadriplegia, C1-C4 incomplete 04/08/2019 No Yes E44.0 Moderate protein-calorie malnutrition 04/08/2019 No Yes Inactive Problems Resolved Problems Electronic Signature(s) Signed: 06/06/2019 12:53:33 PM By: Worthy Keeler PA-C Entered By: Worthy Keeler on 06/06/2019 12:53:33 Piehl, [name] ([id number]) -------------------------------------------------------------------------------- Progress Note Details Patient Name: Graylon Good. Date of Service: 06/06/2019 12:30 PM Medical Record Number: [id number] Patient Account Number: [id number] Date of Birth/Sex: 09-26-1965 (53 y.o. F) Treating RN: Montey Hora Primary Care Provider: [place], JULIE Other Clinician: Referring Provider: [place], JULIE Treating Provider/Extender: Melburn Hake, Bryen Hinderman Weeks in Treatment: 8 Subjective Chief Complaint Information obtained from Patient Bilateral gluteal and right hip pressure ulcers History of Present Illness (HPI) 53 year old patient who was been an unreliable historian says she had completely healed in February and most recently her home health noticed that the wound on  the right scapular area had reopened. She was in hospital about a month ago with pneumonia at that time a workup was done. We have no notes today but will workup the electronic  medical record. on review of her records electronically I do not find any recent hospital MRI in the last 6 months. She had a CT scan of the chest in February 2018 which does not show any osseous lesions. she was admitted to the hospital between May 14 and May 17 for 3 days for mucus plugging and collapse of the left lung. at that time she had a decubitus ulcer of the right scapular region stage III. her history was noted to have quadriplegia C5-C7 incomplete, neurogenic bladder, nephrostomy catheter and emphysema. patient tells me that she has not smoked for last month and a half but as per her hospital records she was smoking in the middle of May. 01/12/17 patient scapular region appears to be doing fairly well on evaluation today. She is having really no syndicate discomfort and has been tolerating the dressing changes well complication. Readmission: 04/08/2019 patient presents today for reevaluation in our clinic concerning issues she is having with wounds on the bilateral gluteal area as well as on the right trochanter. She has unfortunately noted that things seem to have gotten worse as she was using the Clinitron bed. The patient states it gives off a lot of heat and subsequently has not been good for her skin causing her to breakdown more. She has been trying to get back just to the air mattress but has not been able to get the company to come pick up that bed and bring out the air mattress. Her primary care provider is on this as well trying to get them to do so. Unfortunately her wounds are significantly large and show signs of hyper granulation as well. She has had this kind of issue for quite some time and unfortunately just does not seem to be showing signs of significant improvement with what she has been using currently. No fevers, chills, nausea, vomiting, or diarrhea. She does attempt appropriate offloading and again has been using the bed because that is all she has although she is  not happy with it. 04/22/2019 on evaluation today patient appears to be doing about the same with regard to her wounds at this time. Fortunately there does not appear to be any signs of active infection at this point which is good news. She has been tolerating the dressing changes without complication. Overall I am pleased with that with that being said unfortunately she does have a little bit of more significant breakdown with regard to the wound bed upon evaluation today. I believe this is secondary to the fact that she has been lying potentially too much in a single position for too long over the past several weeks. I think that potentially if she were to mitigate this to a degree that things may improve somewhat. With that being said she is really not happy with her current bed she is attempted to have them come out and switch this back to an air mattress but unfortunately has not been successful in getting them to come out and make this change. She is very frustrated with that. She still has the Clinitron bed and she much prefers just having the standard air mattress. Patient's primary care provider still working on this she tells me. 05/08/2019 on evaluation today patient unfortunately is not doing as well as I would like to  see in regard to her wounds. She actually is measuring a little bit larger pretty much at all wound locations unfortunately and overall I am very upset as well with the fact that she has been trying to get the Clinitron bed removed from her home but is not having any luck as such. We did get the number for the company today in order for [place] to see about calling them and switching this over to an air mattress as Eldridge, Bryonna M. ([id number]) this is giving her a lot of trouble she states is really hot and she feels like it is breaking down her skin. This is not good. 05/22/2019 on evaluation today patient appears to be doing well with regard to the wounds she is showing some  signs of slight improvement at this point. This is good news. Fortunately there is no evidence of active infection at this time which is also good news. No fevers, chills, nausea, vomiting, or diarrhea. She does tell me that in order to get rid of the Clinitron bed that she has been working on that the representative from the company stated they needed in order for me asked to be getting rid of it and then what we wanted her to have. Again I will be more than happy to do that we can initiate an air mattress for her. 06/06/2019 on evaluation today patient continues to have issues with her wounds. The good news is we figured out what to be done for the her bad as far as get not moved however she is getting actually ready to move and change residences. For that reason what they are going to do is come in and take the bed from the current residence to get rid of the [place] bed and then subsequently move the new bed into her new place of residence. This makes complete sense to me as well and I think that sounds like a good plan. At least we have a plan for what to do as far as getting things taken care of here. 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 For about 4-5 months she's had a decubitus ulcer on the right scapular region. she was lost to follow-up for the last 6 months. she had a couple of small areas which have opened out in the region of previous scar tissue in the sacral region but these haveall healed now. She does not have any other significant comorbidities. She has a good Roho cushion for her wheelchair and she has a specialized mattress for sleep. She has restarted smoking and smokes about half a pack of cigarettes a day 02/18/2015 -- X-ray of the pelvis -- IMPRESSION:No acute osseous injury of the pelvis. If there is clinical concern regarding  osteomyelitis, recommend further evaluation with CT. X-ray of the sacrum and coccyx IMPRESSION:No acute osseous abnormality of the sacrum. If there is further clinical concern regarding osteomyelitis of the sacrum, further evaluation with CT is recommended. ========= Patient History Information obtained from Patient. Family History Cancer - Mother, Diabetes - Siblings, Hypertension - Siblings, No family history of Heart Disease, Hereditary Spherocytosis, Kidney Disease, Lung Disease, Seizures, Stroke, Thyroid Problems, Tuberculosis. Social History Former smoker - ended on 03/09/2019, Marital Status - Separated, Alcohol Use - Never, Drug Use - No History, Caffeine Use - Never. Medical History Eyes Denies history of Cataracts, Glaucoma, Optic Neuritis Ear/Nose/Mouth/Throat Denies history of Chronic  sinus problems/congestion, Middle ear problems Hematologic/Lymphatic Denies history of Anemia, Hemophilia, Human Immunodeficiency Virus, Lymphedema, Sickle Cell Disease Respiratory Patient has history of Asthma, Chronic Obstructive Pulmonary Disease (COPD) Cardiovascular Denies history of Angina, Arrhythmia, Congestive Heart Failure, Coronary Artery Disease, Deep Vein Thrombosis, Hypertension, Hypotension, Myocardial Infarction, Peripheral Arterial Disease, Peripheral Venous Disease, Phlebitis, Vasculitis Gastrointestinal Denies history of Cirrhosis , Colitis, Crohn s, Hepatitis A, Hepatitis B, Hepatitis C Endocrine Denies history of Type I Diabetes, Type II Diabetes Genitourinary Radford, Mayre M. ([id number]) Denies history of End Stage Renal Disease Immunological Denies history of Lupus Erythematosus, Raynaud s, Scleroderma Integumentary (Skin) Patient has history of History of pressure wounds Denies history of History of Burn Musculoskeletal Denies history of Gout, Rheumatoid Arthritis, Osteoarthritis, Osteomyelitis Neurologic Patient has history of Paraplegia - C 3  paraplegia Oncologic Denies history of Received Chemotherapy, Received Radiation Hospitalization/Surgery History - collapsed lung and lung congestion. Medical And Surgical History Notes Genitourinary urostomy Musculoskeletal right arm contracture Review of Systems (ROS) Constitutional Symptoms (General Health) Denies complaints or symptoms of Fatigue, Fever, Chills, Marked Weight Change. Respiratory Denies complaints or symptoms of Chronic or frequent coughs, Shortness of Breath. Cardiovascular Denies complaints or symptoms of Chest pain, LE edema. Psychiatric Denies complaints or symptoms of Anxiety, Claustrophobia. Objective Constitutional Well-nourished and well-hydrated in no acute distress. Vitals Time Taken: 12:35 PM, Height: 63 in, Weight: 70 lbs, BMI: 12.4, Temperature: 98.8 F, Pulse: 67 bpm, Respiratory Rate: 18 breaths/min, Blood Pressure: 114/72 mmHg. Respiratory normal breathing without difficulty. clear to auscultation bilaterally. Cardiovascular regular rate and rhythm with normal S1, S2. Psychiatric this patient is able to make decisions and demonstrates good insight into disease process. Alert and Oriented x 3. pleasant and cooperative. XITLALY, AULT. ([id number]) General Notes: His wound bed currently showed signs of good granulation at this time. Fortunately there is no evidence of active infection at this point. Her wounds really do not seem to be dramatically better compared to previous although there is not much slough buildup. However it does not look like that the appropriate alginate has been utilized at this point we specifically prescribed or recommended silver cell that is not what is being used currently. There does seem to be some confusion I did recommend that we give them the package today to take and show home health what exactly we are recommended. That will also obviously be on the orders. Integumentary (Hair, Skin) Wound #5 status is Open.  Original cause of wound was Gradually Appeared. The wound is located on the Right Gluteus. The wound measures 4cm length x 5.2cm width x 0.1cm depth; 16.336cm^2 area and 1.634cm^3 volume. There is Fat Layer (Subcutaneous Tissue) Exposed exposed. There is no tunneling or undermining noted. There is a large amount of serous drainage noted. The wound margin is flat and intact. There is medium (34-66%) red granulation within the wound bed. There is a medium (34-66%) amount of necrotic tissue within the wound bed including Eschar and Adherent Slough. Wound #6 status is Open. Original cause of wound was Pressure Injury. The wound is located on the Right Sacrum. The wound measures 4.3cm length x 13cm width x 0.1cm depth; 43.904cm^2 area and 4.39cm^3 volume. There is Fat Layer (Subcutaneous Tissue) Exposed exposed. There is no tunneling or undermining noted. There is a medium amount of serous drainage noted. The wound margin is flat and intact. There is medium (34-66%) red granulation within the wound bed. There is a medium (34-66%) amount of necrotic tissue within the wound bed including Eschar. Wound #  7 status is Open. Original cause of wound was Gradually Appeared. The wound is located on the Right Trochanter. The wound measures 5.5cm length x 3.5cm width x 0.6cm depth; 15.119cm^2 area and 9.071cm^3 volume. There is Fat Layer (Subcutaneous Tissue) Exposed exposed. There is no tunneling noted, however, there is undermining starting at 7:00 and ending at 9:00 with a maximum distance of 0.7cm. There is additional undermining and at 12:00 and ending at 3:00 with a maximum distance of 1.6cm. There is a medium amount of serous drainage noted. The wound margin is flat and intact. There is medium (34-66%) red granulation within the wound bed. There is a medium (34-66%) amount of necrotic tissue within the wound bed including Eschar. 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 May Shower, gently pat wound dry prior to applying new dressing. Wound #6 Right Sacrum: Clean wound with Normal Saline. May Shower, gently pat wound dry prior to applying new dressing. Wound #7 Right Trochanter: Clean wound with Normal Saline. JONTE, WOLLAM ([id number]) May Shower, gently pat wound dry prior to applying new dressing. Primary Wound Dressing: Wound #5 Right Gluteus: Silver Alginate - Silvercel Non Adherent specifically. Wet the dressing for removal during dressing change. Wound #6 Right Sacrum: Silver Alginate - Silvercel Non Adherent specifically. Wet the dressing for removal during dressing change. Wound #7 Right Trochanter: Silver Alginate - Silvercel Non Adherent specifically. Wet the dressing for removal during dressing change. Secondary Dressing: Wound #5 Right Gluteus: Boardered Foam Dressing Wound #6 Right Sacrum: Boardered Foam Dressing Wound #7 Right Trochanter: Boardered Foam Dressing Dressing Change Frequency: Wound #5 Right Gluteus: Change Dressing Monday, Wednesday, Friday - or three times weekly Wound #6 Right Sacrum: Change Dressing Monday, Wednesday, Friday - or three times weekly Wound #7 Right Trochanter: Change Dressing Monday, Wednesday, Friday - or three times weekly Follow-up Appointments: Return Appointment in 2 weeks. Off-Loading: Wound #5 Right Gluteus: Mattress - air mattress or fluidized air mattress Turn and reposition every 2 hours Wound #6 Right Sacrum: Mattress - air mattress or fluidized air mattress Turn and reposition every 2 hours Wound #7 Right Trochanter: Mattress - air mattress or fluidized air mattress Turn and reposition every 2 hours Additional Orders / Instructions: Wound #5 Right  Gluteus: Increase protein intake. Wound #6 Right Sacrum: Increase protein intake. Wound #7 Right Trochanter: Increase protein intake. Home Health: Wound #5 Right Gluteus: Continue Home Health Visits - [facility] - Please provide dressing materials for patient Home Health Nurse may visit PRN to address patient s wound care needs. FACE TO FACE ENCOUNTER: MEDICARE and MEDICAID PATIENTS: I certify that this patient is under my care and that I had a face-to-face encounter that meets the physician face-to-face encounter requirements with this patient on this date. The encounter with the patient was in whole or in part for the following MEDICAL CONDITION: (primary reason for Home Healthcare) MEDICAL NECESSITY: I certify, that based on my findings, NURSING services are a medically necessary home health service. HOME BOUND STATUS: I certify that my clinical findings support that this patient is homebound (i.e., Due to illness or injury, pt requires aid of supportive devices such as crutches, cane, wheelchairs, walkers, the use of special transportation or the assistance of another person to leave their place of residence. There is a normal  inability to leave the home and doing so requires considerable and taxing effort. Other absences are for medical reasons / religious services and are infrequent or of short duration when for other reasons). If current dressing causes regression in wound condition, may D/C ordered dressing product/s and apply Normal Saline Moist Dressing daily until next Wound Healing Center / Other MD appointment. Notify Wound Healing Center of regression in wound condition at [phone]. Please direct any NON-WOUND related issues/requests for orders to patient's Primary Care Physician Wound #6 Right Sacrum: Continue Home Health Visits - [facility] - Please provide dressing materials for patient NINA, MONDOR ([id number]) Home Health Nurse may visit PRN to address patient s  wound care needs. FACE TO FACE ENCOUNTER: MEDICARE and MEDICAID PATIENTS: I certify that this patient is under my care and that I had a face-to-face encounter that meets the physician face-to-face encounter requirements with this patient on this date. The encounter with the patient was in whole or in part for the following MEDICAL CONDITION: (primary reason for Home Healthcare) MEDICAL NECESSITY: I certify, that based on my findings, NURSING services are a medically necessary home health service. HOME BOUND STATUS: I certify that my clinical findings support that this patient is homebound (i.e., Due to illness or injury, pt requires aid of supportive devices such as crutches, cane, wheelchairs, walkers, the use of special transportation or the assistance of another person to leave their place of residence. There is a normal inability to leave the home and doing so requires considerable and taxing effort. Other absences are for medical reasons / religious services and are infrequent or of short duration when for other reasons). If current dressing causes regression in wound condition, may D/C ordered dressing product/s and apply Normal Saline Moist Dressing daily until next Wound Healing Center / Other MD appointment. Notify Wound Healing Center of regression in wound condition at [phone]. Please direct any NON-WOUND related issues/requests for orders to patient's Primary Care Physician Wound #7 Right Trochanter: Continue Home Health Visits - [facility] - Please provide dressing materials for patient Home Health Nurse may visit PRN to address patient s wound care needs. FACE TO FACE ENCOUNTER: MEDICARE and MEDICAID PATIENTS: I certify that this patient is under my care and that I had a face-to-face encounter that meets the physician face-to-face encounter requirements with this patient on this date. The encounter with the patient was in whole or in part for the following MEDICAL CONDITION:  (primary reason for Home Healthcare) MEDICAL NECESSITY: I certify, that based on my findings, NURSING services are a medically necessary home health service. HOME BOUND STATUS: I certify that my clinical findings support that this patient is homebound (i.e., Due to illness or injury, pt requires aid of supportive devices such as crutches, cane, wheelchairs, walkers, the use of special transportation or the assistance of another person to leave their place of residence. There is a normal inability to leave the home and doing so requires considerable and taxing effort. Other absences are for medical reasons / religious services and are infrequent or of short duration when for other reasons). If current dressing causes regression in wound condition, may D/C ordered dressing product/s and apply Normal Saline Moist Dressing daily until next Wound Healing Center / Other MD appointment. Notify Wound Healing Center of regression in wound condition at [phone]. Please direct any NON-WOUND related issues/requests for orders to patient's Primary Care Physician 1. I would recommend currently that we go ahead and continue with the silver cell that  is what patient felt like was actually helping. 2. I am also going to recommend at this point that we continue with appropriate offloading obviously the patient needs to be very cautious as far as being sure not to spend too much time or allowing too much pressure get to the wound areas if organ to see these areas heal and improve. 3. Good news is she does have a plan for switching out the beds as soon as she moves which should be within the next 2 weeks they will take the old bed away and deliver her new air mattress to the new residence. We will see patient back for reevaluation in 2 weeks here in the clinic. If anything worsens or changes patient will contact our office for additional recommendations. Electronic Signature(s) Signed: 06/06/2019 1:30:37 PM By:  Lenda Kelp PA-C Entered By: Lenda Kelp on 06/06/2019 13:30:37 Fleeger, [name] ([id number]) -------------------------------------------------------------------------------- ROS/PFSH Details Patient Name: INICE, SANLUIS. Date of Service: 06/06/2019 12:30 PM Medical Record Number: [id number] Patient Account Number: [id number] Date of Birth/Sex: [birth year] (53 y.o. F) Treating RN: Curtis Sites Primary Care Provider: [place], JULIE Other Clinician: Referring Provider: [place], JULIE Treating Provider/Extender: STONE III, Mylynn Dinh Weeks in Treatment: 8 Information Obtained From Patient Constitutional Symptoms (General Health) Complaints and Symptoms: Negative for: Fatigue; Fever; Chills; Marked Weight Change Respiratory Complaints and Symptoms: Negative for: Chronic or frequent coughs; Shortness of Breath Medical History: Positive for: Asthma; Chronic Obstructive Pulmonary Disease (COPD) Cardiovascular Complaints and Symptoms: Negative for: Chest pain; LE edema Medical History: Negative for: Angina; Arrhythmia; Congestive Heart Failure; Coronary Artery Disease; Deep Vein Thrombosis; Hypertension; Hypotension; Myocardial Infarction; Peripheral Arterial Disease; Peripheral Venous Disease; Phlebitis; Vasculitis Psychiatric Complaints and Symptoms: Negative for: Anxiety; Claustrophobia Eyes Medical History: Negative for: Cataracts; Glaucoma; Optic Neuritis Ear/Nose/Mouth/Throat Medical History: Negative for: Chronic sinus problems/congestion; Middle ear problems Hematologic/Lymphatic Medical History: Negative for: Anemia; Hemophilia; Human Immunodeficiency Virus; Lymphedema; Sickle Cell Disease Gastrointestinal Medical History: Negative for: Cirrhosis ; Colitis; Crohnos; Hepatitis A; Hepatitis B; Hepatitis C Gruhn, Zameria M. ([id number]) Endocrine Medical History: Negative for: Type I Diabetes; Type II Diabetes Genitourinary Medical History: Negative for: End Stage  Renal Disease Past Medical History Notes: urostomy Immunological Medical History: Negative for: Lupus Erythematosus; Raynaudos; Scleroderma Integumentary (Skin) Medical History: Positive for: History of pressure wounds Negative for: History of Burn Musculoskeletal Medical History: Negative for: Gout; Rheumatoid Arthritis; Osteoarthritis; Osteomyelitis Past Medical History Notes: right arm contracture Neurologic Medical History: Positive for: Paraplegia - C 3 paraplegia Oncologic Medical History: Negative for: Received Chemotherapy; Received Radiation Immunizations Pneumococcal Vaccine: Received Pneumococcal Vaccination: Yes Implantable Devices No devices added Hospitalization / Surgery History Type of Hospitalization/Surgery collapsed lung and lung congestion Family and Social History Cancer: Yes - Mother; Diabetes: Yes - Siblings; Heart Disease: No; Hereditary Spherocytosis: No; Hypertension: Yes - Siblings; Kidney Disease: No; Lung Disease: No; Seizures: No; Stroke: No; Thyroid Problems: No; Tuberculosis: No; Former smoker - ended on 03/09/2019; Marital Status - Separated; Alcohol Use: Never; Drug Use: No History; Caffeine Use: Never; Financial Concerns: No; Food, Clothing or Shelter Needs: No; Support System Lacking: No; Transportation Concerns: No Physician Affirmation I have reviewed and agree with the above information. PHILOMINA, LEON ([id number]) Electronic Signature(s) Signed: 06/06/2019 5:08:51 PM By: Curtis Sites Signed: 06/07/2019 11:37:39 PM By: Lenda Kelp PA-C Entered By: Lenda Kelp on 06/06/2019 13:27:36 Pamer, [name] ([id number]) -------------------------------------------------------------------------------- SuperBill Details Patient Name: KATTLEYA, KUHNERT. Date of Service: 06/06/2019 Medical Record Number: [id number] Patient Account Number: [id number] Date of  Birth/Sex: [birth year] (53 y.o. F) Treating RN: Curtis Sites Primary Care  Provider: [place], JULIE Other Clinician: Referring Provider: [place], JULIE Treating Provider/Extender: Linwood Dibbles, [place] Weeks in Treatment: 8 Diagnosis Coding ICD-10 Codes Code Description L89.313 Pressure ulcer of right buttock, stage 3 L89.323 Pressure ulcer of left buttock, stage 3 L89.213 Pressure ulcer of right hip, stage 3 G82.52 Quadriplegia, C1-C4 incomplete E44.0 Moderate protein-calorie malnutrition Facility Procedures CPT4 Code: 16109604 Description: 99214 - WOUND CARE VISIT-LEV 4 EST PT Modifier: Quantity: 1 Physician Procedures CPT4 Code: 5409811 Description: 99214 - WC PHYS LEVEL 4 - EST PT ICD-10 Diagnosis Description L89.313 Pressure ulcer of right buttock, stage 3 L89.323 Pressure ulcer of left buttock, stage 3 L89.213 Pressure ulcer of right hip, stage 3 G82.52 Quadriplegia, C1-C4 incomplete Modifier: Quantity: 1 Electronic Signature(s) Signed: 06/06/2019 1:30:48 PM By: Lenda Kelp PA-C Entered By: Lenda Kelp on 06/06/2019 13:30:48

## 2019-06-20 ENCOUNTER — Ambulatory Visit: Payer: Medicare Other | Admitting: Physician Assistant

## 2019-06-24 ENCOUNTER — Encounter: Payer: Medicare Other | Admitting: Physician Assistant

## 2019-06-24 ENCOUNTER — Other Ambulatory Visit: Payer: Self-pay

## 2019-06-24 DIAGNOSIS — L89313 Pressure ulcer of right buttock, stage 3: Secondary | ICD-10-CM | POA: Diagnosis not present

## 2019-06-24 NOTE — Progress Notes (Addendum)
EVAN, OSBURN ([id number]) Visit Report [name] 06/24/2019 Chief Complaint Document Details Patient Name: [name], [name] Date of Service: 06/24/2019 3:15 PM Medical Record Number: [id number] Patient Account Number: [id number] Date of Birth/Sex: 1966-03-05 (53 y.o. F) Treating RN: Curtis Sites Primary Care Provider: [place], JULIE Other Clinician: Referring Provider: [place], JULIE Treating Provider/Extender: STONE III, Fadi Menter Weeks in Treatment: 11 Information Obtained from: Patient Chief Complaint Bilateral gluteal and right hip pressure ulcers Electronic Signature(s) Signed: 06/24/2019 3:44:53 PM By: Lenda Kelp PA-C Entered By: Lenda Kelp on 06/24/2019 15:44:52 Ingalls, [name] ([id number]) -------------------------------------------------------------------------------- HPI Details Patient Name: [name], [name] Date of Service: 06/24/2019 3:15 PM Medical Record Number: [id number] Patient Account Number: [id number] Date of Birth/Sex: [birth year] (53 y.o. F) Treating RN: Curtis Sites Primary Care Provider: [place], JULIE Other Clinician: Referring Provider: [place], JULIE Treating Provider/Extender: Linwood Dibbles, Vinnie Bobst Weeks in Treatment: 11 History of Present Illness HPI Description: 53 year old patient who was been an unreliable historian says she had completely healed in February and most recently her home health noticed that the wound on the right scapular area had reopened. She was in hospital about a month ago with pneumonia at that time a workup was done. We have no notes today but will workup the electronic medical record. on review of her records electronically I do not find any recent hospital MRI in the last 6 months. She had a CT scan of the chest in February 2018 which does not show any osseous lesions. she was admitted to the hospital between May 14 and May 17 [name] 3 days [name] mucus plugging and collapse of the left lung. at that time she had a decubitus ulcer  of the right scapular region stage III. her history was noted to have quadriplegia C5-C7 incomplete, neurogenic bladder, nephrostomy catheter and emphysema. patient tells me that she has not smoked [name] last month and a half but as per her hospital records she was smoking in the middle of May. 01/12/17 patient scapular region appears to be doing fairly well on evaluation today. She is having really no syndicate discomfort and has been tolerating the dressing changes well complication. Readmission: 04/08/2019 patient presents today [name] reevaluation in our clinic concerning issues she is having with wounds on the bilateral gluteal area as well as on the right trochanter. She has unfortunately noted that things seem to have gotten worse as she was using the Clinitron bed. The patient states it gives off a lot of heat and subsequently has not been good [name] her skin causing her to breakdown more. She has been trying to get back just to the air mattress but has not been able to get the company to come pick up that bed and bring out the air mattress. Her primary care provider is on this as well trying to get them to do so. Unfortunately her wounds are significantly large and show signs of hyper granulation as well. She has had this kind of issue [name] quite some time and unfortunately just does not seem to be showing signs of significant improvement with what she has been using currently. No fevers, chills, nausea, vomiting, or diarrhea. She does attempt appropriate offloading and again has been using the bed because that is all she has although she is not happy with it. 04/22/2019 on evaluation today patient appears to be doing about the same with regard to her wounds at this time. Fortunately there does not appear to be any signs of active infection at this point which  is good news. She has been tolerating the dressing changes without complication. Overall I am pleased with that with that being said  unfortunately she does have a little bit of more significant breakdown with regard to the wound bed upon evaluation today. I believe this is secondary to the fact that she has been lying potentially too much in a single position [name] too long over the past several weeks. I think that potentially if she were to mitigate this to a degree that things may improve somewhat. With that being said she is really not happy with her current bed she is attempted to have them come out and switch this back to an air mattress but unfortunately has not been successful in getting them to come out and make this change. She is very frustrated with that. She still has the Clinitron bed and she much prefers just having the standard air mattress. Patient's primary care provider still working on this she tells me. 05/08/2019 on evaluation today patient unfortunately is not doing as well as I would like to see in regard to her wounds. She actually is measuring a little bit larger pretty much at all wound locations unfortunately and overall I am very upset as well with the fact that she has been trying to get the Clinitron bed removed from her home but is not having any luck as such. We did get the number [name] the company today in order [name] us to see about calling them and switching this over to an air mattress as this is giving her a lot of trouble she states is really hot and she feels like it is breaking down her skin. This is not good. 05/22/2019 on evaluation today patient appears to be doing well with regard to the wounds she is showing some signs of slight improvement at this point. This is good news. Fortunately there is no evidence of active infection at this time which is also good news. No fevers, chills, nausea, vomiting, or diarrhea. She does tell me that in order to get rid of the Clinitron bed that she has been working on that the representative from [employer] stated they needed in order [name] me asked to  be [name] MastersWADE, Kniyah M. ([id number]) getting rid of it and then what we wanted her to have. Again I will be more than happy to do that we can initiate an air mattress [name] her. 06/06/2019 on evaluation today patient continues to have issues with her wounds. The good news is we figured out what to be done [name] the her bad as far as get not moved however she is getting actually ready to move and change residences. [name] that reason what they are going to do is come in and take the bed from the current residence to get rid of the Clinitron bed and then subsequently move the new bed into her new place of residence. This makes complete sense to me as well and I think that sounds like a good plan. At least we have a plan [name] what to do as far as getting things taken care of here. 06/24/2019 on evaluation today patient appears to be doing really about the same in some regards although a couple of the wounds are measuring a little bit larger and deeper compared to previous. With that being said I do think that unfortunately her wounds seem to be overall progressing in the wrong direction towards being worse not better. The patient is still having to use  the Clinitron bed at this time I do think that she really needs to switch to just a standard air mattress that is what she wants but she is having to wait until she moves in 2 weeks before she can get the new mattress so she does not have to pay to have the bed moved twice. ======= Old notes 53 year old patient was had quadriparesis [name] the last 9 years and has had previous extensive surgery and reconstruction at [facility]. [name] about 4-5 months she's had a decubitus ulcer on the right scapular region. she was lost to follow-up [name] the last 6 months. she had a couple of small areas which have opened out in the region of previous scar tissue in the sacral region but these haveall healed now. She does not have any other significant  comorbidities. She has a good Roho cushion [name] her wheelchair and she has a specialized mattress [name] sleep. She has restarted smoking and smokes about half a pack of cigarettes a day 02/18/2015 -- X-ray of the pelvis -- IMPRESSION:No acute osseous injury of the pelvis. If there is clinical concern regarding osteomyelitis, recommend further evaluation with CT. X-ray of the sacrum and coccyx IMPRESSION:No acute osseous abnormality of the sacrum. If there is further clinical concern regarding osteomyelitis of the sacrum, further evaluation with CT is recommended. ========= Electronic Signature(s) Signed: 06/24/2019 4:58:23 PM By: Lenda Kelp PA-C Entered By: Lenda Kelp on 06/24/2019 16:58:23 Bucaro, [name] ([id number]) -------------------------------------------------------------------------------- Physical Exam Details Patient Name: [name], [name] Date of Service: 06/24/2019 3:15 PM Medical Record Number: [id number] Patient Account Number: [id number] Date of Birth/Sex: [birth year] (53 y.o. F) Treating RN: Curtis Sites Primary Care Provider: [place], JULIE Other Clinician: Referring Provider: [place], JULIE Treating Provider/Extender: STONE III, Gavino Fouch Weeks in Treatment: 11 Constitutional Thin and well-hydrated in no acute distress. Respiratory normal breathing without difficulty. clear to auscultation bilaterally. Cardiovascular regular rate and rhythm with normal S1, S2. Psychiatric this patient is able to make decisions and demonstrates good insight into disease process. Alert and Oriented x 3. pleasant and cooperative. Notes Patient's wounds currently showed some good granulation [name] the most part there was some areas of hyper granulation in general the biggest issue I see is that they seem to be measuring larger not better that is very concerning to me obviously that is not the direction that we want everything to take. Electronic Signature(s) Signed: 06/24/2019  4:59:12 PM By: Lenda Kelp PA-C Entered By: Lenda Kelp on 06/24/2019 16:59:12 Berish, [name] -------------------------------------------------------------------------------- Physician Orders Details Patient Name: [name], [name] Date of Service: 06/24/2019 3:15 PM Medical Record Number: [id number] Patient Account Number: [id number] Date of Birth/Sex: [birth year] (53 y.o. F) Treating RN: Curtis Sites Primary Care Provider: [place], JULIE Other Clinician: Referring Provider: [place], JULIE Treating Provider/Extender: Linwood Dibbles, Nicosha Struve Weeks in Treatment: 11 Verbal / Phone Orders: No Diagnosis Coding ICD-10 Coding Code Description L89.313 Pressure ulcer of right buttock, stage 3 L89.323 Pressure ulcer of left buttock, stage 3 L89.213 Pressure ulcer of right hip, stage 3 G82.52 Quadriplegia, C1-C4 incomplete E44.0 Moderate protein-calorie malnutrition Wound Cleansing Wound #5 Right Gluteus o Clean wound with Normal Saline. o May Shower, gently pat wound dry prior to applying new dressing. Wound #6 Right Sacrum o Clean wound with Normal Saline. o May Shower, gently pat wound dry prior to applying new dressing. Wound #7 Right Trochanter o Clean wound with Normal Saline. o May Shower, gently pat wound dry prior to applying new  dressing. Primary Wound Dressing Wound #5 Right Gluteus o Silver Alginate - Silvercel Non Adherent specifically. Wet the dressing [name] removal during dressing change. Wound #6 Right Sacrum o Silver Alginate - Silvercel Non Adherent specifically. Wet the dressing [name] removal during dressing change. Wound #7 Right Trochanter o Silver Alginate - Silvercel Non Adherent specifically. Wet the dressing [name] removal during dressing change. Secondary Dressing Wound #5 Right Gluteus o Boardered Foam Dressing Wound #6 Right Sacrum o Boardered Foam Dressing Wound #7 Right Trochanter o Boardered Foam Dressing Dressing  Change Frequency NNENNA, MEADOR. ([id number]) Wound #5 Right Gluteus o Change Dressing Monday, Wednesday, Friday - or three times weekly Wound #6 Right Sacrum o Change Dressing Monday, Wednesday, Friday - or three times weekly Wound #7 Right Trochanter o Change Dressing Monday, Wednesday, Friday - or three times weekly Follow-up Appointments o Return Appointment in 1 week. Off-Loading Wound #5 Right Gluteus o Mattress - air mattress or fluidized air mattress o Turn and reposition every 2 hours Wound #6 Right Sacrum o Mattress - air mattress or fluidized air mattress o Turn and reposition every 2 hours Wound #7 Right Trochanter o Mattress - air mattress or fluidized air mattress o Turn and reposition every 2 hours Additional Orders / Instructions Wound #5 Right Gluteus o Increase protein intake. Wound #6 Right Sacrum o Increase protein intake. Wound #7 Right Trochanter o Increase protein intake. Home Health Wound #5 Right [place] Visits - [facility] - Please provide dressing materials [name] patient o Home Health Nurse may visit PRN to address patientos wound care needs. o FACE TO FACE ENCOUNTER: MEDICARE and MEDICAID PATIENTS: I certify that this patient is under my care and that I had a face-to-face encounter that meets the physician face-to-face encounter requirements with this patient on this date. The encounter with the patient was in whole or in part [name] the following MEDICAL CONDITION: (primary reason [name] [place]) MEDICAL NECESSITY: I certify, that based on my findings, NURSING services are a medically necessary home health service. HOME BOUND STATUS: I certify that my clinical findings support that this patient is homebound (i.e., Due to illness or injury, pt requires aid of supportive devices such as crutches, cane, wheelchairs, walkers, the use of special transportation or the assistance of another person to  leave their place of residence. There is a normal inability to leave the home and doing so requires considerable and taxing effort. Other absences are [name] medical reasons / religious services and are infrequent or of short duration when [name] other reasons). o If current dressing causes regression in wound condition, may D/C ordered dressing product/s and apply Normal Saline Moist Dressing daily until next [place] / Other MD appointment. [place] of regression in wound condition at [phone]. o Please direct any NON-WOUND related issues/requests [name] orders to patient's Primary Care Physician Wound #6 Right [place] Visits - [facility] - Please provide dressing materials [name] patient o Home Health Nurse may visit PRN to address patientos wound care needs. [name], [name] ([id number]) o FACE TO FACE ENCOUNTER: MEDICARE and MEDICAID PATIENTS: I certify that this patient is under my care and that I had a face-to-face encounter that meets the physician face-to-face encounter requirements with this patient on this date. The encounter with the patient was in whole or in part [name] the following MEDICAL CONDITION: (primary reason [name] [place]) MEDICAL NECESSITY: I certify, that based on my findings, NURSING services are a  medically necessary home health service. HOME BOUND STATUS: I certify that my clinical findings support that this patient is homebound (i.e., Due to illness or injury, pt requires aid of supportive devices such as crutches, cane, wheelchairs, walkers, the use of special transportation or the assistance of another person to leave their place of residence. There is a normal inability to leave the home and doing so requires considerable and taxing effort. Other absences are [name] medical reasons / religious services and are infrequent or of short duration when [name] other reasons). o If current dressing causes regression in  wound condition, may D/C ordered dressing product/s and apply Normal Saline Moist Dressing daily until next Wound Healing Center / Other MD appointment. Notify Wound Healing Center of regression in wound condition at [phone]. o Please direct any NON-WOUND related issues/requests [name] orders to patient's Primary Care Physician Wound #7 Right Trochanter o Continue Home Health Visits - [facility] - Please provide dressing materials [name] patient o Home Health Nurse may visit PRN to address patientos wound care needs. o FACE TO FACE ENCOUNTER: MEDICARE and MEDICAID PATIENTS: I certify that this patient is under my care and that I had a face-to-face encounter that meets the physician face-to-face encounter requirements with this patient on this date. The encounter with the patient was in whole or in part [name] the following MEDICAL CONDITION: (primary reason [name] Home Healthcare) MEDICAL NECESSITY: I certify, that based on my findings, NURSING services are a medically necessary home health service. HOME BOUND STATUS: I certify that my clinical findings support that this patient is homebound (i.e., Due to illness or injury, pt requires aid of supportive devices such as crutches, cane, wheelchairs, walkers, the use of special transportation or the assistance of another person to leave their place of residence. There is a normal inability to leave the home and doing so requires considerable and taxing effort. Other absences are [name] medical reasons / religious services and are infrequent or of short duration when [name] other reasons). o If current dressing causes regression in wound condition, may D/C ordered dressing product/s and apply Normal Saline Moist Dressing daily until next Wound Healing Center / Other MD appointment. Notify Wound Healing Center of regression in wound condition at [phone]. o Please direct any NON-WOUND related issues/requests [name] orders to patient's Primary Care  Physician Electronic Signature(s) Signed: 06/24/2019 4:45:05 PM By: Curtis Sites Signed: 06/24/2019 5:15:45 PM By: Lenda Kelp PA-C Entered By: Curtis Sites on 06/24/2019 16:14:26 [name], [name] ([id number]) -------------------------------------------------------------------------------- Problem List Details Patient Name: [name], [name] Date of Service: 06/24/2019 3:15 PM Medical Record Number: [id number] Patient Account Number: [id number] Date of Birth/Sex: [birth year] (53 y.o. F) Treating RN: Curtis Sites Primary Care Provider: [place], JULIE Other Clinician: Referring Provider: [place], JULIE Treating Provider/Extender: Linwood Dibbles, Taber Sweetser Weeks in Treatment: 11 Active Problems ICD-10 Evaluated Encounter Code Description Active Date Today Diagnosis L89.313 Pressure ulcer of right buttock, stage 3 04/08/2019 No Yes L89.323 Pressure ulcer of left buttock, stage 3 04/08/2019 No Yes L89.213 Pressure ulcer of right hip, stage 3 04/08/2019 No Yes G82.52 Quadriplegia, C1-C4 incomplete 04/08/2019 No Yes E44.0 Moderate protein-calorie malnutrition 04/08/2019 No Yes Inactive Problems Resolved Problems Electronic Signature(s) Signed: 06/24/2019 3:44:42 PM By: Lenda Kelp PA-C Entered By: Lenda Kelp on 06/24/2019 15:44:41 [name], [name] ([id number]) -------------------------------------------------------------------------------- Progress Note Details Patient Name: [name] Date of Service: 06/24/2019 3:15 PM Medical Record Number: [id number] Patient Account Number: [id number] Date of Birth/Sex: 21-Jun-1966 (53 y.o. F) Treating RN: Francesco Sor,  Mardene Celeste Primary Care Provider: [place], JULIE Other Clinician: Referring Provider: [place], JULIE Treating Provider/Extender: Linwood Dibbles, Jamoni Broadfoot Weeks in Treatment: 11 Subjective Chief Complaint Information obtained from Patient Bilateral gluteal and right hip pressure ulcers History of Present Illness (HPI) 53 year old  patient who was been an unreliable historian says she had completely healed in February and most recently her home health noticed that the wound on the right scapular area had reopened. She was in hospital about a month ago with pneumonia at that time a workup was done. We have no notes today but will workup the electronic medical record. on review of her records electronically I do not find any recent hospital MRI in the last 6 months. She had a CT scan of the chest in February 2018 which does not show any osseous lesions. she was admitted to the hospital between May 14 and May 17 [name] 3 days [name] mucus plugging and collapse of the left lung. at that time she had a decubitus ulcer of the right scapular region stage III. her history was noted to have quadriplegia C5-C7 incomplete, neurogenic bladder, nephrostomy catheter and emphysema. patient tells me that she has not smoked [name] last month and a half but as per her hospital records she was smoking in the middle of May. 01/12/17 patient scapular region appears to be doing fairly well on evaluation today. She is having really no syndicate discomfort and has been tolerating the dressing changes well complication. Readmission: 04/08/2019 patient presents today [name] reevaluation in our clinic concerning issues she is having with wounds on the bilateral gluteal area as well as on the right trochanter. She has unfortunately noted that things seem to have gotten worse as she was using the Clinitron bed. The patient states it gives off a lot of heat and subsequently has not been good [name] her skin causing her to breakdown more. She has been trying to get back just to the air mattress but has not been able to get the company to come pick up that bed and bring out the air mattress. Her primary care provider is on this as well trying to get them to do so. Unfortunately her wounds are significantly large and show signs of hyper granulation as well. She has had this  kind of issue [name] quite some time and unfortunately just does not seem to be showing signs of significant improvement with what she has been using currently. No fevers, chills, nausea, vomiting, or diarrhea. She does attempt appropriate offloading and again has been using the bed because that is all she has although she is not happy with it. 04/22/2019 on evaluation today patient appears to be doing about the same with regard to her wounds at this time. Fortunately there does not appear to be any signs of active infection at this point which is good news. She has been tolerating the dressing changes without complication. Overall I am pleased with that with that being said unfortunately she does have a little bit of more significant breakdown with regard to the wound bed upon evaluation today. I believe this is secondary to the fact that she has been lying potentially too much in a single position [name] too long over the past several weeks. I think that potentially if she were to mitigate this to a degree that things may improve somewhat. With that being said she is really not happy with her current bed she is attempted to have them come out and switch this back to an air  mattress but unfortunately has not been successful in getting them to come out and make this change. She is very frustrated with that. She still has the Clinitron bed and she much prefers just having the standard air mattress. Patient's primary care provider still working on this she tells me. 05/08/2019 on evaluation today patient unfortunately is not doing as well as I would like to see in regard to her wounds. She actually is measuring a little bit larger pretty much at all wound locations unfortunately and overall I am very upset as well with the fact that she has been trying to get the Clinitron bed removed from her home but is not having any luck as such. We did get the number [name] the company today in order [name] [place] to see about  calling them and switching this over to an air mattress as Zeidman, Kymari M. ([id number]) this is giving her a lot of trouble she states is really hot and she feels like it is breaking down her skin. This is not good. 05/22/2019 on evaluation today patient appears to be doing well with regard to the wounds she is showing some signs of slight improvement at this point. This is good news. Fortunately there is no evidence of active infection at this time which is also good news. No fevers, chills, nausea, vomiting, or diarrhea. She does tell me that in order to get rid of the Clinitron bed that she has been working on that the representative from the company stated they needed in order [name] me asked to be getting rid of it and then what we wanted her to have. Again I will be more than happy to do that we can initiate an air mattress [name] her. 06/06/2019 on evaluation today patient continues to have issues with her wounds. The good news is we figured out what to be done [name] the her bad as far as get not moved however she is getting actually ready to move and change residences. [name] that reason what they are going to do is come in and take the bed from the current residence to get rid of the Clinitron bed and then subsequently move the new bed into her new place of residence. This makes complete sense to me as well and I think that sounds like a good plan. At least we have a plan [name] what to do as far as getting things taken care of here. 06/24/2019 on evaluation today patient appears to be doing really about the same in some regards although a couple of the wounds are measuring a little bit larger and deeper compared to previous. With that being said I do think that unfortunately her wounds seem to be overall progressing in the wrong direction towards being worse not better. The patient is still having to use the Clinitron bed at this time I do think that she really needs to switch to just a standard air  mattress that is what she wants but she is having to wait until she moves in 2 weeks before she can get the new mattress so she does not have to pay to have the bed moved twice. ======= Old notes 53 year old patient was had quadriparesis [name] the last 9 years and has had previous extensive surgery and reconstruction at [facility]. [name] about 4-5 months she's had a decubitus ulcer on the right scapular region. she was lost to follow-up [name] the last 6 months. she had a couple of small areas which  have opened out in the region of previous scar tissue in the sacral region but these haveall healed now. She does not have any other significant comorbidities. She has a good Roho cushion [name] her wheelchair and she has a specialized mattress [name] sleep. She has restarted smoking and smokes about half a pack of cigarettes a day 02/18/2015 -- X-ray of the pelvis -- IMPRESSION:No acute osseous injury of the pelvis. If there is clinical concern regarding osteomyelitis, recommend further evaluation with CT. X-ray of the sacrum and coccyx IMPRESSION:No acute osseous abnormality of the sacrum. If there is further clinical concern regarding osteomyelitis of the sacrum, further evaluation with CT is recommended. ========= Patient History Information obtained from Patient. Family History Cancer - Mother, Diabetes - Siblings, Hypertension - Siblings, No family history of Heart Disease, Hereditary Spherocytosis, Kidney Disease, Lung Disease, Seizures, Stroke, Thyroid Problems, Tuberculosis. Social History Former smoker - ended on 03/09/2019, Marital Status - Separated, Alcohol Use - Never, Drug Use - No History, Caffeine Use - Never. Medical History Eyes Denies history of Cataracts, Glaucoma, Optic Neuritis Ear/Nose/Mouth/Throat Denies history of Chronic sinus problems/congestion, Middle ear problems Hematologic/Lymphatic Denies history of Anemia, Hemophilia, Human Immunodeficiency Virus,  Lymphedema, Sickle Cell Disease Respiratory Patient has history of Asthma, Chronic Obstructive Pulmonary Disease (COPD) Cardiovascular Denies history of Angina, Arrhythmia, Congestive Heart Failure, Coronary Artery Disease, Deep Vein Thrombosis, Dingley, Saadiya M. ([id number]) Hypertension, Hypotension, Myocardial Infarction, Peripheral Arterial Disease, Peripheral Venous Disease, Phlebitis, Vasculitis Gastrointestinal Denies history of Cirrhosis , Colitis, Crohn s, Hepatitis A, Hepatitis B, Hepatitis C Endocrine Denies history of Type I Diabetes, Type II Diabetes Genitourinary Denies history of End Stage Renal Disease Immunological Denies history of Lupus Erythematosus, Raynaud s, Scleroderma Integumentary (Skin) Patient has history of History of pressure wounds Denies history of History of Burn Musculoskeletal Denies history of Gout, Rheumatoid Arthritis, Osteoarthritis, Osteomyelitis Neurologic Patient has history of Paraplegia - C 3 paraplegia Oncologic Denies history of Received Chemotherapy, Received Radiation Hospitalization/Surgery History - collapsed lung and lung congestion. Medical And Surgical History Notes Genitourinary urostomy Musculoskeletal right arm contracture Review of Systems (ROS) Constitutional Symptoms (General Health) Denies complaints or symptoms of Fatigue, Fever, Chills, Marked Weight Change. Respiratory Denies complaints or symptoms of Chronic or frequent coughs, Shortness of Breath. Cardiovascular Denies complaints or symptoms of Chest pain, LE edema. Psychiatric Denies complaints or symptoms of Anxiety, Claustrophobia. Objective Constitutional Thin and well-hydrated in no acute distress. Vitals Time Taken: 3:05 PM, Height: 63 in, Weight: 70 lbs, BMI: 12.4, Temperature: 98.8 F, Pulse: 84 bpm, Respiratory Rate: 16 breaths/min, Blood Pressure: 104/70 mmHg. Respiratory normal breathing without difficulty. clear to auscultation  bilaterally. Cardiovascular [name], [name] ([id number]) regular rate and rhythm with normal S1, S2. Psychiatric this patient is able to make decisions and demonstrates good insight into disease process. Alert and Oriented x 3. pleasant and cooperative. General Notes: Patient's wounds currently showed some good granulation [name] the most part there was some areas of hyper granulation in general the biggest issue I see is that they seem to be measuring larger not better that is very concerning to me obviously that is not the direction that we want everything to take. Integumentary (Hair, Skin) Wound #5 status is Open. Original cause of wound was Gradually Appeared. The wound is located on the Right Gluteus. The wound measures 4cm length x 8cm width x 0.2cm depth; 25.133cm^2 area and 5.027cm^3 volume. There is Fat Layer (Subcutaneous Tissue) Exposed exposed. There is no tunneling or undermining noted. There is a large amount  of serous drainage noted. The wound margin is flat and intact. There is medium (34-66%) red granulation within the wound bed. There is a medium (34-66%) amount of necrotic tissue within the wound bed including Eschar and Adherent Slough. Wound #6 status is Open. Original cause of wound was Pressure Injury. The wound is located on the Right Sacrum. The wound measures 5cm length x 13cm width x 0.8cm depth; 51.051cm^2 area and 40.841cm^3 volume. Wound #7 status is Open. Original cause of wound was Gradually Appeared. The wound is located on the Right Trochanter. The wound measures 6.5cm length x 5cm width x 0.6cm depth; 25.525cm^2 area and 15.315cm^3 volume. There is Fat Layer (Subcutaneous Tissue) Exposed exposed. There is undermining starting at 7:00 and ending at 9:00 with a maximum distance of 1.5cm. There is additional undermining and at 10:00 and ending at 3:00 with a maximum distance of 1.7cm. There is a medium amount of serous drainage noted. The wound margin is flat and  intact. There is medium (34-66%) red granulation within the wound bed. There is a medium (34-66%) amount of necrotic tissue within the wound bed including Adherent Slough. 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 May Shower, gently pat wound dry prior to applying new dressing. Wound #6 Right Sacrum: Clean wound with Normal Saline. May Shower, gently pat wound dry prior to applying new dressing. Wound #7 Right Trochanter: QUINTINA, HAKEEM. ([id number]) Clean wound with Normal Saline. May Shower, gently pat wound dry prior to applying new dressing. Primary Wound Dressing: Wound #5 Right Gluteus: Silver Alginate - Silvercel Non Adherent specifically. Wet the dressing [name] removal during dressing change. Wound #6 Right Sacrum: Silver Alginate - Silvercel Non Adherent specifically. Wet the dressing [name] removal during dressing change. Wound #7 Right Trochanter: Silver Alginate - Silvercel Non Adherent specifically. Wet the dressing [name] removal during dressing change. Secondary Dressing: Wound #5 Right Gluteus: Boardered Foam Dressing Wound #6 Right Sacrum: Boardered Foam Dressing Wound #7 Right Trochanter: Boardered Foam Dressing Dressing Change Frequency: Wound #5 Right Gluteus: Change Dressing Monday, Wednesday, Friday - or three times weekly Wound #6 Right Sacrum: Change Dressing Monday, Wednesday, Friday - or three times weekly Wound #7 Right Trochanter: Change Dressing Monday, Wednesday, Friday - or three times weekly Follow-up Appointments: Return Appointment in 1 week. Off-Loading: Wound #5 Right Gluteus: Mattress - air mattress or fluidized air mattress Turn and reposition every 2 hours Wound #6 Right  Sacrum: Mattress - air mattress or fluidized air mattress Turn and reposition every 2 hours Wound #7 Right Trochanter: Mattress - air mattress or fluidized air mattress Turn and reposition every 2 hours Additional Orders / Instructions: Wound #5 Right Gluteus: Increase protein intake. Wound #6 Right Sacrum: Increase protein intake. Wound #7 Right Trochanter: Increase protein intake. Home Health: Wound #5 Right Gluteus: Continue Home Health Visits - [facility] - Please provide dressing materials [name] patient Home Health Nurse may visit PRN to address patient s wound care needs. FACE TO FACE ENCOUNTER: MEDICARE and MEDICAID PATIENTS: I certify that this patient is under my care and that I had a face-to-face encounter that meets the physician face-to-face encounter requirements with this patient on this date. The encounter with the patient was in whole or in part [name] the following MEDICAL CONDITION: (primary reason [name] Home Healthcare) MEDICAL  NECESSITY: I certify, that based on my findings, NURSING services are a medically necessary home health service. HOME BOUND STATUS: I certify that my clinical findings support that this patient is homebound (i.e., Due to illness or injury, pt requires aid of supportive devices such as crutches, cane, wheelchairs, walkers, the use of special transportation or the assistance of another person to leave their place of residence. There is a normal inability to leave the home and doing so requires considerable and taxing effort. Other absences are [name] medical reasons / religious services and are infrequent or of short duration when [name] other reasons). If current dressing causes regression in wound condition, may D/C ordered dressing product/s and apply Normal Saline Moist Dressing daily until next Wound Healing Center / Other MD appointment. Notify Wound Healing Center of regression in wound condition at [phone]. Please direct any NON-WOUND related  issues/requests [name] orders to patient's Primary Care Physician Wound #6 Right Sacrum: [name], [name] ([id number]) Continue Home Health Visits - [facility] - Please provide dressing materials [name] patient Home Health Nurse may visit PRN to address patient s wound care needs. FACE TO FACE ENCOUNTER: MEDICARE and MEDICAID PATIENTS: I certify that this patient is under my care and that I had a face-to-face encounter that meets the physician face-to-face encounter requirements with this patient on this date. The encounter with the patient was in whole or in part [name] the following MEDICAL CONDITION: (primary reason [name] Home Healthcare) MEDICAL NECESSITY: I certify, that based on my findings, NURSING services are a medically necessary home health service. HOME BOUND STATUS: I certify that my clinical findings support that this patient is homebound (i.e., Due to illness or injury, pt requires aid of supportive devices such as crutches, cane, wheelchairs, walkers, the use of special transportation or the assistance of another person to leave their place of residence. There is a normal inability to leave the home and doing so requires considerable and taxing effort. Other absences are [name] medical reasons / religious services and are infrequent or of short duration when [name] other reasons). If current dressing causes regression in wound condition, may D/C ordered dressing product/s and apply Normal Saline Moist Dressing daily until next Wound Healing Center / Other MD appointment. Notify Wound Healing Center of regression in wound condition at [phone]. Please direct any NON-WOUND related issues/requests [name] orders to patient's Primary Care Physician Wound #7 Right Trochanter: Continue Home Health Visits - [facility] - Please provide dressing materials [name] patient Home Health Nurse may visit PRN to address patient s wound care needs. FACE TO FACE ENCOUNTER: MEDICARE and MEDICAID PATIENTS: I certify that  this patient is under my care and that I had a face-to-face encounter that meets the physician face-to-face encounter requirements with this patient on this date. The encounter with the patient was in whole or in part [name] the following MEDICAL CONDITION: (primary reason [name] Home Healthcare) MEDICAL NECESSITY: I certify, that based on my findings, NURSING services are a medically necessary home health service. HOME BOUND STATUS: I certify that my clinical findings support that this patient is homebound (i.e., Due to illness or injury, pt requires aid of supportive devices such as crutches, cane, wheelchairs, walkers, the use of special transportation or the assistance of another person to leave their place of residence. There is a normal inability to leave the home and doing so requires considerable and taxing effort. Other absences are [name] medical reasons / religious services and are infrequent or of short duration when [name]  other reasons). If current dressing causes regression in wound condition, may D/C ordered dressing product/s and apply Normal Saline Moist Dressing daily until next Wound Healing Center / Other MD appointment. Notify Wound Healing Center of regression in wound condition at [phone]. Please direct any NON-WOUND related issues/requests [name] orders to patient's Primary Care Physician 1. I am still concerned about the fact the patient's wounds seem to be getting worse not better. I am hopeful that we will be able to her wounds headed in the right direction when she gets the appropriate air mattress and gets rid of the Clinitron bed which she feels like has been detrimental to her. 2. I would recommend [name] the time being that we continue with the silver alginate dressing specifically silver cell in order to prevent anything from sticking to her wounds. 3. We will continue with the bordered foam dressings. 4. The patient does need to continue to try to offload as much as possible  to prevent anything from worsening this was discussed in detail again today. We will see patient back [name] reevaluation in 1 week here in the clinic. If anything worsens or changes patient will contact our office [name] additional recommendations. Electronic Signature(s) Signed: 06/24/2019 5:01:38 PM By: Lenda Kelp PA-C Entered By: Lenda Kelp on 06/24/2019 17:01:38 [name], [name] ([id number]) -------------------------------------------------------------------------------- ROS/PFSH Details Patient Name: [name], FORGY. Date of Service: 06/24/2019 3:15 PM Medical Record Number: [id number] Patient Account Number: [id number] Date of Birth/Sex: May 18, 1966 (53 y.o. F) Treating RN: Curtis Sites Primary Care Provider: [place], JULIE Other Clinician: Referring Provider: [place], JULIE Treating Provider/Extender: STONE III, Anzley Dibbern Weeks in Treatment: 11 Information Obtained From Patient Constitutional Symptoms (General Health) Complaints and Symptoms: Negative [name]: Fatigue; Fever; Chills; Marked Weight Change Respiratory Complaints and Symptoms: Negative [name]: Chronic or frequent coughs; Shortness of Breath Medical History: Positive [name]: Asthma; Chronic Obstructive Pulmonary Disease (COPD) Cardiovascular Complaints and Symptoms: Negative [name]: Chest pain; LE edema Medical History: Negative [name]: Angina; Arrhythmia; Congestive Heart Failure; Coronary Artery Disease; Deep Vein Thrombosis; Hypertension; Hypotension; Myocardial Infarction; Peripheral Arterial Disease; Peripheral Venous Disease; Phlebitis; Vasculitis Psychiatric Complaints and Symptoms: Negative [name]: Anxiety; Claustrophobia Eyes Medical History: Negative [name]: Cataracts; Glaucoma; Optic Neuritis Ear/Nose/Mouth/Throat Medical History: Negative [name]: Chronic sinus problems/congestion; Middle ear problems Hematologic/Lymphatic Medical History: Negative [name]: Anemia; Hemophilia; Human Immunodeficiency Virus; Lymphedema;  Sickle Cell Disease Gastrointestinal Medical History: Negative [name]: Cirrhosis ; Colitis; Crohnos; Hepatitis A; Hepatitis B; Hepatitis C Dura, Milicent M. ([id number]) Endocrine Medical History: Negative [name]: Type I Diabetes; Type II Diabetes Genitourinary Medical History: Negative [name]: End Stage Renal Disease Past Medical History Notes: urostomy Immunological Medical History: Negative [name]: Lupus Erythematosus; Raynaudos; Scleroderma Integumentary (Skin) Medical History: Positive [name]: History of pressure wounds Negative [name]: History of Burn Musculoskeletal Medical History: Negative [name]: Gout; Rheumatoid Arthritis; Osteoarthritis; Osteomyelitis Past Medical History Notes: right arm contracture Neurologic Medical History: Positive [name]: Paraplegia - C 3 paraplegia Oncologic Medical History: Negative [name]: Received Chemotherapy; Received Radiation Immunizations Pneumococcal Vaccine: Received Pneumococcal Vaccination: Yes Implantable Devices No devices added Hospitalization / Surgery History Type of Hospitalization/Surgery collapsed lung and lung congestion Family and Social History Cancer: Yes - Mother; Diabetes: Yes - Siblings; Heart Disease: No; Hereditary Spherocytosis: No; Hypertension: Yes - Siblings; Kidney Disease: No; Lung Disease: No; Seizures: No; Stroke: No; Thyroid Problems: No; Tuberculosis: No; Former smoker - ended on 03/09/2019; Marital Status - Separated; Alcohol Use: Never; Drug Use: No History; Caffeine Use: Never; Financial Concerns: No; Food, Clothing or Shelter Needs: No; Support System Lacking:  No; Transportation Concerns: No Physician Affirmation I have reviewed and agree with the above information. GETHSEMANE, FISCHLER ([id number]) Electronic Signature(s) Signed: 06/24/2019 5:15:45 PM By: Lenda Kelp PA-C Signed: 06/25/2019 4:45:20 PM By: Curtis Sites Entered By: Lenda Kelp on 06/24/2019 16:58:38 Strothman, [name]  ([id number]) -------------------------------------------------------------------------------- SuperBill Details Patient Name: KATE, LAROCK. Date of Service: 06/24/2019 Medical Record Number: [id number] Patient Account Number: [id number] Date of Birth/Sex: 01-05-1966 (53 y.o. F) Treating RN: Curtis Sites Primary Care Provider: [place], JULIE Other Clinician: Referring Provider: [place], JULIE Treating Provider/Extender: Linwood Dibbles, Burnice Oestreicher Weeks in Treatment: 11 Diagnosis Coding ICD-10 Codes Code Description L89.313 Pressure ulcer of right buttock, stage 3 L89.323 Pressure ulcer of left buttock, stage 3 L89.213 Pressure ulcer of right hip, stage 3 G82.52 Quadriplegia, C1-C4 incomplete E44.0 Moderate protein-calorie malnutrition Facility Procedures CPT4 Code: 08657846 Description: 99214 - WOUND CARE VISIT-LEV 4 EST PT Modifier: Quantity: 1 Physician Procedures CPT4 Code: 9629528 Description: 99214 - WC PHYS LEVEL 4 - EST PT ICD-10 Diagnosis Description L89.313 Pressure ulcer of right buttock, stage 3 L89.323 Pressure ulcer of left buttock, stage 3 L89.213 Pressure ulcer of right hip, stage 3 G82.52 Quadriplegia, C1-C4 incomplete Modifier: Quantity: 1 Electronic Signature(s) Signed: 06/24/2019 5:01:49 PM By: Lenda Kelp PA-C Entered By: Lenda Kelp on 06/24/2019 17:01:49

## 2019-06-30 ENCOUNTER — Ambulatory Visit: Payer: Medicare Other | Admitting: Internal Medicine

## 2019-07-07 ENCOUNTER — Ambulatory Visit: Payer: Medicare Other | Admitting: Physician Assistant

## 2019-07-09 NOTE — Progress Notes (Signed)
[name], [name] ([id number]) Visit Report for 06/24/2019 Arrival Information Details Patient Name: [name], [name] Date of Service: 06/24/2019 3:15 PM Medical Record Number: [id number] Patient Account Number: [id number] Date of Birth/Sex: 08-17-1965 (54 y.o. F) Treating RN: Curtis Sitesorthy, Joanna Primary Care [name]: [place], JULIE Other Clinician: Referring Via Rosado: [place], JULIE Treating Stephinie Battisti/Extender: Linwood DibblesSTONE III, HOYT Weeks in Treatment: 11 Visit Information History Since Last Visit Added or deleted any medications: No Patient Arrived: Wheel Chair Any new allergies or adverse reactions: No Arrival Time: 15:03 Had a fall or experienced change in No activities of daily living that may affect Accompanied By: partner risk of falls: Transfer Assistance: Manual Signs or symptoms of abuse/neglect since last visito No Patient Identification Verified: Yes Hospitalized since last visit: No Secondary Verification Process Completed: Yes Implantable device outside of the clinic excluding No Patient Requires Transmission-Based No cellular tissue based products placed in the center Precautions: since last visit: Patient Has Alerts: No Has Dressing in Place as Prescribed: Yes Pain Present Now: No Electronic Signature(s) Signed: 06/24/2019 3:55:13 PM By: Dayton MartesWallace, RCP,RRT,CHT, Sallie RCP, RRT, CHT Entered By: Weyman RodneyWallace, RCP,RRT,CHT, Lucio EdwardSallie on 06/24/2019 15:03:54 Siverson, [name] ([id number]) -------------------------------------------------------------------------------- Clinic Level of Care Assessment Details Patient Name: [name], [name] Date of Service: 06/24/2019 3:15 PM Medical Record Number: [id number] Patient Account Number: [id number] Date of Birth/Sex: 08-17-1965 (54 y.o. F) Treating RN: Curtis Sitesorthy, Joanna Primary Care Bocephus Cali: [place], JULIE Other Clinician: Referring Len Kluver: [place], JULIE Treating Dock Baccam/Extender: Linwood DibblesSTONE III, HOYT Weeks in Treatment: 11 Clinic Level of Care  Assessment Items TOOL 4 Quantity Score []  - Use when only an EandM is performed on FOLLOW-UP visit 0 ASSESSMENTS - Nursing Assessment / Reassessment X - Reassessment of Co-morbidities (includes updates in patient status) 1 10 X- 1 5 Reassessment of Adherence to Treatment Plan ASSESSMENTS - Wound and Skin Assessment / Reassessment []  - Simple Wound Assessment / Reassessment - one wound 0 X- 3 5 Complex Wound Assessment / Reassessment - multiple wounds []  - 0 Dermatologic / Skin Assessment (not related to wound area) ASSESSMENTS - Focused Assessment []  - Circumferential Edema Measurements - multi extremities 0 []  - 0 Nutritional Assessment / Counseling / Intervention []  - 0 Lower Extremity Assessment (monofilament, tuning fork, pulses) []  - 0 Peripheral Arterial Disease Assessment (using hand held doppler) ASSESSMENTS - Ostomy and/or Continence Assessment and Care []  - Incontinence Assessment and Management 0 []  - 0 Ostomy Care Assessment and Management (repouching, etc.) PROCESS - Coordination of Care X - Simple Patient / Family Education for ongoing care 1 15 []  - 0 Complex (extensive) Patient / Family Education for ongoing care X- 1 10 Staff obtains ChiropractorConsents, Records, Test Results / Process Orders []  - 0 Staff telephones HHA, Nursing Homes / Clarify orders / etc []  - 0 Routine Transfer to another Facility (non-emergent condition) []  - 0 Routine Hospital Admission (non-emergent condition) []  - 0 New Admissions / Manufacturing engineernsurance Authorizations / Ordering NPWT, Apligraf, etc. []  - 0 Emergency Hospital Admission (emergent condition) X- 1 10 Simple Discharge Coordination [name], [name] ([id number]) []  - 0 Complex (extensive) Discharge Coordination PROCESS - Special Needs []  - Pediatric / Minor Patient Management 0 []  - 0 Isolation Patient Management []  - 0 Hearing / Language / Visual special needs []  - 0 Assessment of Community assistance (transportation, D/C planning,  etc.) []  - 0 Additional assistance / Altered mentation []  - 0 Support Surface(s) Assessment (bed, cushion, seat, etc.) INTERVENTIONS - Wound Cleansing / Measurement []  - Simple Wound Cleansing - one wound 0 X-  3 5 Complex Wound Cleansing - multiple wounds X- 1 5 Wound Imaging (photographs - any number of wounds) []  - 0 Wound Tracing (instead of photographs) []  - 0 Simple Wound Measurement - one wound X- 3 5 Complex Wound Measurement - multiple wounds INTERVENTIONS - Wound Dressings X - Small Wound Dressing one or multiple wounds 3 10 []  - 0 Medium Wound Dressing one or multiple wounds []  - 0 Large Wound Dressing one or multiple wounds []  - 0 Application of Medications - topical []  - 0 Application of Medications - injection INTERVENTIONS - Miscellaneous []  - External ear exam 0 []  - 0 Specimen Collection (cultures, biopsies, blood, body fluids, etc.) []  - 0 Specimen(s) / Culture(s) sent or taken to Lab for analysis []  - 0 Patient Transfer (multiple staff / / Similar devices) []  - 0 Simple Staple / Suture removal (25 or less) []  - 0 Complex Staple / Suture removal (26 or more) []  - 0 Hypo / Hyperglycemic Management (close monitor of Blood Glucose) []  - 0 Ankle / Brachial Index (ABI) - do not check if billed separately X- 1 5 Vital Signs [name], [name] ( ) Has the patient been seen at the hospital within the last three years: Yes Total Score: 135 Level Of Care: New/Established - Level 4 Electronic Signature(s) Signed: 06/24/2019 4:45:05 PM By: Entered By: on 06/24/2019 16:17:16 Sterry, ( ) -------------------------------------------------------------------------------- Encounter Discharge Information Details Patient Name: [name], [name] Date of Service: 06/24/2019 3:15 PM Medical Record Number: Nurse, adult Patient Account Number: Date of Birth/Sex: [birth year] (54 y.o.  F) Treating RN: Primary Care Negar Sieler: [id number], JULIE Other Clinician: Referring Rafaelita Foister: 06/26/2019, JULIE Treating Josefine Fuhr/Extender: Curtis Sites, HOYT Weeks in Treatment: 11 Encounter Discharge Information Items Discharge Condition: Stable Ambulatory Status: Wheelchair Discharge Destination: Home Transportation: Private Auto Accompanied By: husband Schedule Follow-up Appointment: Yes Clinical Summary of Care: Electronic Signature(s) Signed: 06/24/2019 4:45:05 PM By: 06/26/2019 Entered By: [name] on 06/24/2019 16:18:08 [name], [name] (06/26/2019) -------------------------------------------------------------------------------- Lower Extremity Assessment Details Patient Name: [name], [name] Date of Service: 06/24/2019 3:15 PM Medical Record Number: 09/21/1965 Patient Account Number: [id number] Date of Birth/Sex: 1965/12/12 (54 y.o. F) Treating RN: [place] Primary Care Nethan Caudillo: Linwood Dibbles, JULIE Other Clinician: Referring Collyns Mcquigg: 06/26/2019, JULIE Treating Shashank Kwasnik/Extender: Curtis Sites in Treatment: 11 Electronic Signature(s) Signed: 07/09/2019 4:51:46 PM By: 06/26/2019, BSN, RN, CWS, Kim RN, BSN Entered By: [name], BSN, RN, CWS, Kim on 06/24/2019 15:24:13 Mirkin, [name] (06/26/2019) -------------------------------------------------------------------------------- Multi Wound Chart Details Patient Name: [name], [name] Date of Service: 06/24/2019 3:15 PM Medical Record Number: 09/21/1965 Patient Account Number: 40 Date of Birth/Sex: 08-18-1965 (54 y.o. F) Treating RN: [place] Primary Care Deadrick Stidd: Skeet Simmer, JULIE Other Clinician: Referring Sophronia Varney: 09/06/2019, JULIE Treating Eyvonne Burchfield/Extender: STONE III, HOYT Weeks in Treatment: 11 Vital Signs Height(in): 63 Pulse(bpm): 84 Weight(lbs): 70 Blood Pressure(mmHg): 104/70 Body Mass Index(BMI): 12 Temperature(F): 98.8 Respiratory Rate 16 (breaths/min): Photos: [6:No  Photos] Wound Location: Right Gluteus Right Sacrum Right Trochanter Wounding Event: Gradually Appeared Pressure Injury Gradually Appeared Primary Etiology: Pressure Ulcer Pressure Ulcer Pressure Ulcer Comorbid History: Asthma, Chronic Obstructive N/A Asthma, Chronic Obstructive Pulmonary Disease (COPD), Pulmonary Disease (COPD), History of pressure wounds, History of pressure wounds, Paraplegia Paraplegia Date Acquired: 10/16/2018 10/16/2018 04/02/2018 Weeks of Treatment: 11 11 11  Wound Status: Open Open Open Clustered Wound: Yes No No Clustered Quantity: 3 N/A N/A Measurements L x W x D 4x8x0.2 5x13x0.8 6.5x5x0.6 (cm) Area (cm) : 25.133 51.051  25.525 Volume (cm) : 5.027 40.841 15.315 % Reduction in Area: 46.70% -43163.60% -62.50% % Reduction in Volume: 84.80% -340241.70% -8.30% Starting Position 1 7 (o'clock): Ending Position 1 9 (o'clock): Maximum Distance 1 (cm): 1.5 Starting Position 2 10 (o'clock): Ending Position 2 3 (o'clock): Maximum Distance 2 (cm): 1.7 Undermining: No N/A Yes Gee, Nikeria M. ([id number]) Classification: Category/Stage III Category/Stage III Category/Stage III Exudate Amount: Large N/A Medium Exudate Type: Serous N/A Serous Exudate Color: amber N/A amber Wound Margin: Flat and Intact N/A Flat and Intact Granulation Amount: Medium (34-66%) N/A Medium (34-66%) Granulation Quality: Red N/A Red Necrotic Amount: Medium (34-66%) N/A Medium (34-66%) Necrotic Tissue: Eschar, Adherent Slough N/A Adherent Slough Exposed Structures: Fat Layer (Subcutaneous N/A Fat Layer (Subcutaneous Tissue) Exposed: Yes Tissue) Exposed: Yes Fascia: No Fascia: No Tendon: No Tendon: No Muscle: No Muscle: No Joint: No Joint: No Bone: No Bone: No Epithelialization: Small (1-33%) N/A Small (1-33%) Treatment Notes Electronic Signature(s) Signed: 06/24/2019 4:45:05 PM By: Curtis Sites Entered By: Curtis Sites on 06/24/2019 16:10:12 Skorupski, [name]  ([id number]) -------------------------------------------------------------------------------- Multi-Disciplinary Care Plan Details Patient Name: BETHANNE, MULE. Date of Service: 06/24/2019 3:15 PM Medical Record Number: [id number] Patient Account Number: [id number] Date of Birth/Sex: [birth year] (54 y.o. F) Treating RN: Curtis Sites Primary Care Samani Deal: [place], JULIE Other Clinician: Referring Norah Devin: [place], JULIE Treating Montzerrat Brunell/Extender: Linwood Dibbles, HOYT Weeks in Treatment: 11 Active Inactive Abuse / Safety / Falls / Self Care Management Nursing Diagnoses: Impaired physical mobility Goals: Patient will not develop complications from immobility Date Initiated: 04/08/2019 Target Resolution Date: 07/12/2019 Goal Status: Active Interventions: Assess fall risk on admission and as needed Notes: Nutrition Nursing Diagnoses: Potential for alteratiion in Nutrition/Potential for imbalanced nutrition Goals: Patient/caregiver agrees to and verbalizes understanding of need to use nutritional supplements and/or vitamins as prescribed Date Initiated: 04/08/2019 Target Resolution Date: 07/12/2019 Goal Status: Active Interventions: Assess patient nutrition upon admission and as needed per policy Notes: Orientation to the Wound Care Program Nursing Diagnoses: Knowledge deficit related to the wound healing center program Goals: Patient/caregiver will verbalize understanding of the Wound Healing Center Program Date Initiated: 04/08/2019 Target Resolution Date: 07/12/2019 Goal Status: Active Interventions: Provide education on orientation to the wound center [name], GUEDES. ([id number]) Notes: Pressure Nursing Diagnoses: Knowledge deficit related to management of pressures ulcers Goals: Patient will remain free of pressure ulcers Date Initiated: 04/08/2019 Target Resolution Date: 07/12/2019 Goal Status: Active Interventions: Assess potential for pressure ulcer upon admission and as  needed Notes: Wound/Skin Impairment Nursing Diagnoses: Impaired tissue integrity Goals: Ulcer/skin breakdown will heal within 14 weeks Date Initiated: 04/08/2019 Target Resolution Date: 07/12/2019 Goal Status: Active Interventions: Assess patient/caregiver ability to obtain necessary supplies Assess patient/caregiver ability to perform ulcer/skin care regimen upon admission and as needed Assess ulceration(s) every visit Notes: Electronic Signature(s) Signed: 06/24/2019 4:45:05 PM By: Curtis Sites Entered By: Curtis Sites on 06/24/2019 16:10:02 Capasso, [name] ([id number]) -------------------------------------------------------------------------------- Pain Assessment Details Patient Name: [name] Date of Service: 06/24/2019 3:15 PM Medical Record Number: [id number] Patient Account Number: [id number] Date of Birth/Sex: [birth year] (54 y.o. F) Treating RN: Curtis Sites Primary Care Brenn Deziel: [place], JULIE Other Clinician: Referring Kamarah Bilotta: [place], JULIE Treating Naiyana Barbian/Extender: STONE III, HOYT Weeks in Treatment: 11 Active Problems Location of Pain Severity and Description of Pain Patient Has Paino No Site Locations Pain Management and Medication Current Pain Management: Electronic Signature(s) Signed: 06/24/2019 3:55:13 PM By: Sallee Provencal, RRT, CHT Signed: 06/24/2019 4:45:05 PM By: Curtis Sites Entered By: Dayton Martes on 06/24/2019 15:04:06  TEIGAN, MANNER ([id number]) -------------------------------------------------------------------------------- Patient/Caregiver Education Details Patient Name: JANNAH, GUARDIOLA. Date of Service: 06/24/2019 3:15 PM Medical Record Number: [id number] Patient Account Number: [id number] Date of Birth/Gender: 09-Jun-1966 (54 y.o. F) Treating RN: Montey Hora Primary Care Physician: [place], JULIE Other Clinician: Referring Physician: [place], JULIE Treating Physician/Extender:  Sharalyn Ink in Treatment: 11 Education Assessment Education Provided To: Patient and Caregiver Education Topics Provided Pressure: Handouts: Preventing Pressure Ulcers Methods: Explain/Verbal Responses: State content correctly Electronic Signature(s) Signed: 06/24/2019 4:45:05 PM By: Montey Hora Entered By: Montey Hora on 06/24/2019 16:17:33 Feltz, [name] ([id number]) -------------------------------------------------------------------------------- Wound Assessment Details Patient Name: [name], [name] Date of Service: 06/24/2019 3:15 PM Medical Record Number: [id number] Patient Account Number: [id number] Date of Birth/Sex: 11/16/1965 (54 y.o. F) Treating RN: Cornell Barman Primary Care Quintin Hjort: [place], JULIE Other Clinician: Referring Maxx Calaway: [place], JULIE Treating Bayli Quesinberry/Extender: STONE III, HOYT Weeks in Treatment: 11 Wound Status Wound Number: 5 Primary Pressure Ulcer Etiology: Wound Location: Right Gluteus Wound Open Wounding Event: Gradually Appeared Status: Date Acquired: 10/16/2018 Comorbid Asthma, Chronic Obstructive Pulmonary Weeks Of Treatment: 11 History: Disease (COPD), History of pressure wounds, Clustered Wound: Yes Paraplegia Photos Wound Measurements Length: (cm) 4 % Reduction i Width: (cm) 8 % Reduction i Depth: (cm) 0.2 Epithelializa Clustered Quantity: 3 Tunneling: Area: (cm) 25.133 Undermining: Volume: (cm) 5.027 n Area: 46.7% n Volume: 84.8% tion: Small (1-33%) No No Wound Description Classification: Category/Stage III Foul Odor Aft Wound Margin: Flat and Intact Slough/Fibrin Exudate Amount: Large Exudate Type: Serous Exudate Color: amber er Cleansing: No o Yes Wound Bed Granulation Amount: Medium (34-66%) Exposed Structure Granulation Quality: Red Fascia Exposed: No Necrotic Amount: Medium (34-66%) Fat Layer (Subcutaneous Tissue) Exposed: Yes Necrotic Quality: Eschar, Adherent Slough Tendon Exposed:  No Muscle Exposed: No Joint Exposed: No Bone Exposed: No JOSSLYNN, MENTZER ([id number]) Treatment Notes Wound #5 (Right Gluteus) Notes silvercel, BFD Electronic Signature(s) Signed: 07/09/2019 4:51:46 PM By: Gretta Cool, BSN, RN, CWS, Kim RN, BSN Entered By: Gretta Cool, BSN, RN, CWS, Kim on 06/24/2019 15:20:28 Botelho, [name] ([id number]) -------------------------------------------------------------------------------- Wound Assessment Details Patient Name: MOLLEIGH, HUOT. Date of Service: 06/24/2019 3:15 PM Medical Record Number: [id number] Patient Account Number: [id number] Date of Birth/Sex: 1966/03/28 (54 y.o. F) Treating RN: Cornell Barman Primary Care Pasha Gadison: [place], JULIE Other Clinician: Referring Rowan Pollman: [place], JULIE Treating Birdie Beveridge/Extender: STONE III, HOYT Weeks in Treatment: 11 Wound Status Wound Number: 6 Primary Etiology: Pressure Ulcer Wound Location: Right Sacrum Wound Status: Open Wounding Event: Pressure Injury Date Acquired: 10/16/2018 Weeks Of Treatment: 11 Clustered Wound: No Photos Photo Uploaded By: Gretta Cool, BSN, RN, CWS, Kim on 06/26/2019 08:03:27 Wound Measurements Length: (cm) 5 Width: (cm) 13 Depth: (cm) 0.8 Area: (cm) 51.051 Volume: (cm) 40.841 % Reduction in Area: -43163.6% % Reduction in Volume: -340241.7% Wound Description Classification: Category/Stage III Treatment Notes Wound #6 (Right Sacrum) Notes silvercel, BFD Electronic Signature(s) Signed: 07/09/2019 4:51:46 PM By: Gretta Cool, BSN, RN, CWS, Kim RN, BSN Entered By: Gretta Cool, BSN, RN, CWS, Kim on 06/24/2019 15:20:15 Basilio, [name] ([id number]) -------------------------------------------------------------------------------- Wound Assessment Details Patient Name: JAMISON, YUHASZ. Date of Service: 06/24/2019 3:15 PM Medical Record Number: [id number] Patient Account Number: [id number] Date of Birth/Sex: [birth year] (54 y.o. F) Treating RN: Cornell Barman Primary Care Hilma Steinhilber: [place], JULIE  Other Clinician: Referring Garnet Overfield: [place], JULIE Treating Shea Kapur/Extender: STONE III, HOYT Weeks in Treatment: 11 Wound Status Wound Number: 7 Primary Pressure Ulcer Etiology: Wound Location: Right Trochanter Wound Open Wounding Event: Gradually Appeared Status: Date Acquired: 04/02/2018 Comorbid Asthma, Chronic Obstructive Pulmonary Weeks Of  Treatment: 11 History: Disease (COPD), History of pressure wounds, Clustered Wound: No Paraplegia Photos Wound Measurements Length: (cm) 6.5 % Reduction in A Width: (cm) 5 % Reduction in V Depth: (cm) 0.6 Epithelializatio Area: (cm) 25.525 Undermining: Volume: (cm) 15.315 Location 1 Starting P Ending Pos Maximum Di Location 2 Starting P Ending Pos Maximum Di rea: -62.5% olume: -8.3% n: Small (1-33%) Yes osition (o'clock): 7 ition (o'clock): 9 stance: (cm) 1.5 osition (o'clock): 10 ition (o'clock): 3 stance: (cm) 1.7 Wound Description Classification: Category/Stage III Foul Odor After Wound Margin: Flat and Intact Slough/Fibrino Exudate Amount: Medium Exudate Type: Serous Exudate Color: amber Cleansing: No No Wound Bed Granulation Amount: Medium (34-66%) Exposed Structure Granulation Quality: Red Fascia Exposed: No Koerner, Avannah M. ([id number]) Necrotic Amount: Medium (34-66%) Fat Layer (Subcutaneous Tissue) Exposed: Yes Necrotic Quality: Adherent Slough Tendon Exposed: No Muscle Exposed: No Joint Exposed: No Bone Exposed: No Treatment Notes Wound #7 (Right Trochanter) Notes silvercel, BFD Electronic Signature(s) Signed: 07/09/2019 4:51:46 PM By: Elliot Gurney, BSN, RN, CWS, Kim RN, BSN Entered By: Elliot Gurney, BSN, RN, CWS, Kim on 06/24/2019 15:22:53 Bump, [name] ([id number]) -------------------------------------------------------------------------------- Vitals Details Patient Name: [name] Date of Service: 06/24/2019 3:15 PM Medical Record Number: [id number] Patient Account Number: [id number] Date  of Birth/Sex: Aug 05, 1965 (54 y.o. F) Treating RN: Curtis Sites Primary Care Rasheeda Mulvehill: [place], JULIE Other Clinician: Referring Shakenna Herrero: [place], JULIE Treating Lenus Trauger/Extender: STONE III, HOYT Weeks in Treatment: 11 Vital Signs Time Taken: 15:05 Temperature (F): 98.8 Height (in): 63 Pulse (bpm): 84 Weight (lbs): 70 Respiratory Rate (breaths/min): 16 Body Mass Index (BMI): 12.4 Blood Pressure (mmHg): 104/70 Reference Range: 80 - 120 mg / dl Electronic Signature(s) Signed: 06/24/2019 3:55:13 PM By: Dayton Martes RCP, RRT, CHT Entered By: Dayton Martes on 06/24/2019 15:07:49

## 2019-07-11 ENCOUNTER — Ambulatory Visit: Payer: Medicare Other | Admitting: Physician Assistant

## 2019-07-18 ENCOUNTER — Other Ambulatory Visit: Payer: Self-pay

## 2019-07-18 ENCOUNTER — Encounter: Payer: Medicare Other | Attending: Physician Assistant | Admitting: Physician Assistant

## 2019-07-18 DIAGNOSIS — G8252 Quadriplegia, C1-C4 incomplete: Secondary | ICD-10-CM | POA: Diagnosis not present

## 2019-07-18 DIAGNOSIS — L89213 Pressure ulcer of right hip, stage 3: Secondary | ICD-10-CM | POA: Diagnosis not present

## 2019-07-18 DIAGNOSIS — L89159 Pressure ulcer of sacral region, unspecified stage: Secondary | ICD-10-CM | POA: Diagnosis not present

## 2019-07-18 DIAGNOSIS — L89313 Pressure ulcer of right buttock, stage 3: Secondary | ICD-10-CM | POA: Diagnosis not present

## 2019-07-18 DIAGNOSIS — E440 Moderate protein-calorie malnutrition: Secondary | ICD-10-CM | POA: Insufficient documentation

## 2019-07-18 NOTE — Progress Notes (Addendum)
[name], [name] ([id number]) Visit Report for 07/18/2019 Chief Complaint Document Details Patient Name: [name], [name] Date of Service: 07/18/2019 10:45 AM Medical Record Number: [id number] Patient Account Number: [id number] Date of Birth/Sex: [birth year] (54 y.o. F) Treating RN: Curtis Sites Primary Care Provider: [place], JULIE Other Clinician: Referring Provider: [place], JULIE Treating Provider/Extender: STONE III, [place] Weeks in Treatment: 14 Information Obtained from: Patient Chief Complaint Bilateral gluteal and right hip pressure ulcers Electronic Signature(s) Signed: 07/18/2019 11:13:16 AM By: Lenda Kelp PA-C Entered By: Lenda Kelp on 07/18/2019 11:13:16 [name], [name] [name] ([id number]) -------------------------------------------------------------------------------- HPI Details Patient Name: [name] Date of Service: 07/18/2019 10:45 AM Medical Record Number: [id number] Patient Account Number: [id number] Date of Birth/Sex: [birth year] (54 y.o. F) Treating RN: Curtis Sites Primary Care Provider: [place], JULIE Other Clinician: Referring Provider: [place], JULIE Treating Provider/Extender: Linwood Dibbles, Myeisha Kruser Weeks in Treatment: 14 History of Present Illness HPI Description: 54 year old patient who was been an unreliable historian says she had completely healed in February and most recently her home health noticed that the wound on the right scapular area had reopened. She was in hospital about a month ago with pneumonia at that time a workup was done. We have no notes today but will workup the electronic medical record. on review of her records electronically I do not find any recent hospital MRI in the last 6 months. She had a CT scan of the chest in February 2018 which does not show any osseous lesions. she was admitted to the hospital between May 14 and May 17 for 3 days for mucus plugging and collapse of the left lung. at that time she had a decubitus ulcer  of the right scapular region stage III. her history was noted to have quadriplegia C5-C7 incomplete, neurogenic bladder, nephrostomy catheter and emphysema. patient tells me that she has not smoked for last month and a half but as per her hospital records she was smoking in the middle of May. 01/12/17 patient scapular region appears to be doing fairly well on evaluation today. She is having really no syndicate discomfort and has been tolerating the dressing changes well complication. Readmission: 04/08/2019 patient presents today for reevaluation in our clinic concerning issues she is having with wounds on the bilateral gluteal area as well as on the right trochanter. She has unfortunately noted that things seem to have gotten worse as she was using the Clinitron bed. The patient states it gives off a lot of heat and subsequently has not been [name] for her skin causing her to breakdown more. She has been trying to get back just to the air mattress but has not been able to get the company to come pick up that bed and bring out the air mattress. Her primary care provider is on this as well trying to get them to do so. Unfortunately her wounds are significantly large and show signs of hyper granulation as well. She has had this kind of issue for quite some time and unfortunately just does not seem to be showing signs of significant improvement with what she has been using currently. No fevers, chills, nausea, vomiting, or diarrhea. She does attempt appropriate offloading and again has been using the bed because that is all she has although she is not happy with it. 04/22/2019 on evaluation today patient appears to be doing about the same with regard to her wounds at this time. Fortunately there does not appear to be any signs of active infection at this point which  is [name] news. She has been tolerating the dressing changes without complication. Overall I am pleased with that with that being said  unfortunately she does have a little bit of more significant breakdown with regard to the wound bed upon evaluation today. I believe this is secondary to the fact that she has been lying potentially too much in a single position for too long over the past several weeks. I think that potentially if she were to mitigate this to a degree that things may improve somewhat. With that being said she is really not happy with her current bed she is attempted to have them come out and switch this back to an air mattress but unfortunately has not been successful in getting them to come out and make this change. She is very frustrated with that. She still has the Clinitron bed and she much prefers just having the standard air mattress. Patient's primary care provider still working on this she tells me. 05/08/2019 on evaluation today patient unfortunately is not doing as well as I would like to see in regard to her wounds. She actually is measuring a little bit larger pretty much at all wound locations unfortunately and overall I am very upset as well with the fact that she has been trying to get the Clinitron bed removed from her home but is not having any luck as such. We did get the number for the company today in order for us to see about calling them and switching this over to an air mattress as this is giving her a lot of trouble she states is really hot and she feels like it is breaking down her skin. This is not [name]. 05/22/2019 on evaluation today patient appears to be doing well with regard to the wounds she is showing some signs of slight improvement at this point. This is [name] news. Fortunately there is no evidence of active infection at this time which is also [name] news. No fevers, chills, nausea, vomiting, or diarrhea. She does tell me that in order to get rid of the Clinitron bed that she has been working on that the representative from [employer] stated they needed in order for me asked to  be [name] MastersWADE, [name] ([id number]) getting rid of it and then what we wanted her to have. Again I will be more than happy to do that we can initiate an air mattress for her. 06/06/2019 on evaluation today patient continues to have issues with her wounds. The [name] news is we figured out what to be done for the her bad as far as get not moved however she is getting actually ready to move and change residences. For that reason what they are going to do is come in and take the bed from the current residence to get rid of the Clinitron bed and then subsequently move the new bed into her new place of residence. This makes complete sense to me as well and I think that sounds like a [name] plan. At least we have a plan for what to do as far as getting things taken care of here. 06/24/2019 on evaluation today patient appears to be doing really about the same in some regards although a couple of the wounds are measuring a little bit larger and deeper compared to previous. With that being said I do think that unfortunately her wounds seem to be overall progressing in the wrong direction towards being worse not better. The patient is still having to use  the Clinitron bed at this time I do think that she really needs to switch to just a standard air mattress that is what she wants but she is having to wait until she moves in 2 weeks before she can get the new mattress so she does not have to pay to have the bed moved twice. 07/18/2019 on evaluation today patient appears to actually be doing somewhat better in regard to her wounds in general. I am very pleased in this regard. With that being said she still has some hyper granulation unfortunately the Hydrofera Blue just was getting stuck to her and the silver cell has done better. Nonetheless I do believe that we do need to probably treat this with some silver nitrate to try to help with the hypergranulation I discussed this with the patient today. She is in  agreement with this plan. 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 For about 4-5 months she's had a decubitus ulcer on the right scapular region. she was lost to follow-up for the last 6 months. she had a couple of small areas which have opened out in the region of previous scar tissue in the sacral region but these haveall healed now. She does not have any other significant comorbidities. She has a [name] Roho cushion for her wheelchair and she has a specialized mattress for sleep. She has restarted smoking and smokes about half a pack of cigarettes a day 02/18/2015 -- X-ray of the pelvis -- IMPRESSION:No acute osseous injury of the pelvis. If there is clinical concern regarding osteomyelitis, recommend further evaluation with CT. X-ray of the sacrum and coccyx IMPRESSION:No acute osseous abnormality of the sacrum. If there is further clinical concern regarding osteomyelitis of the sacrum, further evaluation with CT is recommended. ========= Electronic Signature(s) Signed: 07/18/2019 11:31:14 AM By: Lenda Kelp PA-C Entered By: Lenda Kelp on 07/18/2019 11:31:13 [name], [name] ([id number]) -------------------------------------------------------------------------------- Gaynelle Adu TISS Details Patient Name: [name], [name] Date of Service: 07/18/2019 10:45 AM Medical Record Number: [id number] Patient Account Number: [id number] Date of Birth/Sex: [birth year] (54 y.o. F) Treating RN: Curtis Sites Primary Care Provider: [place], JULIE Other Clinician: Referring Provider: [place], JULIE Treating Provider/Extender: Linwood Dibbles, Kenedie Dirocco Weeks in Treatment: 14 Procedure Performed for: Wound #7 Right Trochanter Performed By: Physician STONE III, Anala Whisenant E., PA-C Post Procedure Diagnosis Same as Pre-procedure Notes 4 sticks of silver nitrate used on both  wounds Electronic Signature(s) Signed: 07/18/2019 12:14:17 PM By: Curtis Sites Entered By: Curtis Sites on 07/18/2019 11:21:35 Behrle, Mera M. ([id number]) -------------------------------------------------------------------------------- CHEM CAUT GRANULATION TISS Details Patient Name: [name] Date of Service: 07/18/2019 10:45 AM Medical Record Number: [id number] Patient Account Number: [id number] Date of Birth/Sex: 10/21/1965 (54 y.o. F) Treating RN: Curtis Sites Primary Care Provider: [place], JULIE Other Clinician: Referring Provider: [place], JULIE Treating Provider/Extender: Linwood Dibbles, Anwar Sakata Weeks in Treatment: 14 Procedure Performed for: Wound #6 Right Sacrum Performed By: Physician STONE III, Dmani Mizer E., PA-C Post Procedure Diagnosis Same as Pre-procedure Notes 4 sticks of silver nitrate used on both wounds Electronic Signature(s) Signed: 07/18/2019 12:14:17 PM By: Curtis Sites Entered By: Curtis Sites on 07/18/2019 11:21:44 Smyser, [name] [name] ([id number]) -------------------------------------------------------------------------------- Physical Exam Details Patient Name: [name], [name] Date of Service: 07/18/2019 10:45 AM Medical Record Number: [id number] Patient Account Number: [id number] Date of Birth/Sex: Dec 05, 1965 (54 y.o. F) Treating RN: Curtis Sites Primary Care Provider: [place], JULIE Other  Clinician: Referring Provider: [place], JULIE Treating Provider/Extender: STONE III, Daven Pinckney Weeks in Treatment: 14 Constitutional Well-nourished and well-hydrated in no acute distress. Respiratory normal breathing without difficulty. Psychiatric this patient is able to make decisions and demonstrates [name] insight into disease process. Alert and Oriented x 3. pleasant and cooperative. Notes Upon inspection patient's wounds again showed hyper granulation and I did have to perform chemical cauterization with silver nitrate to help with some of the hyper granular  regions. With that being said the patient fortunately already seems to be showing some signs of improvement and I do believe having the new bed is actually helping her she is back to just a standard air mattress. I did treat to the areas with silver nitrate today. Electronic Signature(s) Signed: 07/18/2019 11:32:45 AM By: Lenda Kelp PA-C Entered By: Lenda Kelp on 07/18/2019 11:32:45 Tweten, [name] [name] ([id number]) -------------------------------------------------------------------------------- Physician Orders Details Patient Name: [name], [name]. Date of Service: 07/18/2019 10:45 AM Medical Record Number: [id number] Patient Account Number: [id number] Date of Birth/Sex: 12-Jan-1966 (54 y.o. F) Treating RN: Curtis Sites Primary Care Provider: [place], JULIE Other Clinician: Referring Provider: [place], JULIE Treating Provider/Extender: Linwood Dibbles, Whittney Steenson Weeks in Treatment: 14 Verbal / Phone Orders: No Diagnosis Coding ICD-10 Coding Code Description L89.313 Pressure ulcer of right buttock, stage 3 L89.323 Pressure ulcer of left buttock, stage 3 L89.213 Pressure ulcer of right hip, stage 3 G82.52 Quadriplegia, C1-C4 incomplete E44.0 Moderate protein-calorie malnutrition Wound Cleansing Wound #5 Right Gluteus o Clean wound with Normal Saline. o May Shower, gently pat wound dry prior to applying new dressing. Wound #6 Right Sacrum o Clean wound with Normal Saline. o May Shower, gently pat wound dry prior to applying new dressing. Wound #7 Right Trochanter o Clean wound with Normal Saline. o May Shower, gently pat wound dry prior to applying new dressing. Primary Wound Dressing Wound #5 Right Gluteus o Silver Alginate - Silvercel Non Adherent specifically. Wet the dressing for removal during dressing change. Wound #6 Right Sacrum o Silver Alginate - Silvercel Non Adherent specifically. Wet the dressing for removal during dressing change. Wound #7 Right  Trochanter o Silver Alginate - Silvercel Non Adherent specifically. Wet the dressing for removal during dressing change. Secondary Dressing Wound #5 Right Gluteus o Boardered Foam Dressing Wound #6 Right Sacrum o Boardered Foam Dressing Wound #7 Right Trochanter o Boardered Foam Dressing Dressing Change Frequency ULANI, DEGRASSE. ([id number]) Wound #5 Right Gluteus o Change Dressing Monday, Wednesday, Friday - or three times weekly Wound #6 Right Sacrum o Change Dressing Monday, Wednesday, Friday - or three times weekly Wound #7 Right Trochanter o Change Dressing Monday, Wednesday, Friday - or three times weekly Follow-up Appointments o Return Appointment in 1 week. Off-Loading Wound #5 Right Gluteus o Mattress - air mattress or fluidized air mattress o Turn and reposition every 2 hours Wound #6 Right Sacrum o Mattress - air mattress or fluidized air mattress o Turn and reposition every 2 hours Wound #7 Right Trochanter o Mattress - air mattress or fluidized air mattress o Turn and reposition every 2 hours Additional Orders / Instructions Wound #5 Right Gluteus o Increase protein intake. Wound #6 Right Sacrum o Increase protein intake. Wound #7 Right Trochanter o Increase protein intake. Home Health Wound #5 Right Gluteus o Continue Home Health Visits - [facility] - Please provide dressing materials for patient o Home Health Nurse may visit PRN to address patientos wound care needs. o FACE TO FACE ENCOUNTER: MEDICARE and MEDICAID PATIENTS: I certify that this  patient is under my care and that I had a face-to-face encounter that meets the physician face-to-face encounter requirements with this patient on this date. The encounter with the patient was in whole or in part for the following MEDICAL CONDITION: (primary reason for Home Healthcare) MEDICAL NECESSITY: I certify, that based on my findings, NURSING services are a medically  necessary home health service. HOME BOUND STATUS: I certify that my clinical findings support that this patient is homebound (i.e., Due to illness or injury, pt requires aid of supportive devices such as crutches, cane, wheelchairs, walkers, the use of special transportation or the assistance of another person to leave their place of residence. There is a normal inability to leave the home and doing so requires considerable and taxing effort. Other absences are for medical reasons / religious services and are infrequent or of short duration when for other reasons). o If current dressing causes regression in wound condition, may D/C ordered dressing product/s and apply Normal Saline Moist Dressing daily until next Wound Healing Center / Other MD appointment. Notify Wound Healing Center of regression in wound condition at [phone]. o Please direct any NON-WOUND related issues/requests for orders to patient's Primary Care Physician Wound #6 Right Sacrum o Continue Home Health Visits - [facility] - Please provide dressing materials for patient o Home Health Nurse may visit PRN to address patientos wound care needs. [name] MastersWADE, Braylinn M. ([id number]) o FACE TO FACE ENCOUNTER: MEDICARE and MEDICAID PATIENTS: I certify that this patient is under my care and that I had a face-to-face encounter that meets the physician face-to-face encounter requirements with this patient on this date. The encounter with the patient was in whole or in part for the following MEDICAL CONDITION: (primary reason for Home Healthcare) MEDICAL NECESSITY: I certify, that based on my findings, NURSING services are a medically necessary home health service. HOME BOUND STATUS: I certify that my clinical findings support that this patient is homebound (i.e., Due to illness or injury, pt requires aid of supportive devices such as crutches, cane, wheelchairs, walkers, the use of special transportation or the assistance of  another person to leave their place of residence. There is a normal inability to leave the home and doing so requires considerable and taxing effort. Other absences are for medical reasons / religious services and are infrequent or of short duration when for other reasons). o If current dressing causes regression in wound condition, may D/C ordered dressing product/s and apply Normal Saline Moist Dressing daily until next Wound Healing Center / Other MD appointment. Notify Wound Healing Center of regression in wound condition at [phone]. o Please direct any NON-WOUND related issues/requests for orders to patient's Primary Care Physician Wound #7 Right Trochanter o Continue Home Health Visits - [facility] - Please provide dressing materials for patient o Home Health Nurse may visit PRN to address patientos wound care needs. o FACE TO FACE ENCOUNTER: MEDICARE and MEDICAID PATIENTS: I certify that this patient is under my care and that I had a face-to-face encounter that meets the physician face-to-face encounter requirements with this patient on this date. The encounter with the patient was in whole or in part for the following MEDICAL CONDITION: (primary reason for Home Healthcare) MEDICAL NECESSITY: I certify, that based on my findings, NURSING services are a medically necessary home health service. HOME BOUND STATUS: I certify that my clinical findings support that this patient is homebound (i.e., Due to illness or injury, pt requires aid of supportive devices such as crutches,  cane, wheelchairs, walkers, the use of special transportation or the assistance of another person to leave their place of residence. There is a normal inability to leave the home and doing so requires considerable and taxing effort. Other absences are for medical reasons / religious services and are infrequent or of short duration when for other reasons). o If current dressing causes regression in wound  condition, may D/C ordered dressing product/s and apply Normal Saline Moist Dressing daily until next [facility] / Other MD appointment. [place] of regression in wound condition at [phone]. o Please direct any NON-WOUND related issues/requests for orders to patient's Primary Care Physician Electronic Signature(s) Signed: 07/18/2019 12:14:17 PM By: Montey Hora Signed: 07/18/2019 12:29:09 PM By: Worthy Keeler PA-C Entered By: Montey Hora on 07/18/2019 11:22:49 Walmer, [name] ([id number]) -------------------------------------------------------------------------------- Problem List Details Patient Name: FALINE, LANGER. Date of Service: 07/18/2019 10:45 AM Medical Record Number: [id number] Patient Account Number: [id number] Date of Birth/Sex: February 14, 1966 (54 y.o. F) Treating RN: Montey Hora Primary Care Provider: [place], JULIE Other Clinician: Referring Provider: [place], JULIE Treating Provider/Extender: Melburn Hake, Hagan Maltz Weeks in Treatment: 14 Active Problems ICD-10 Evaluated Encounter Code Description Active Date Today Diagnosis L89.313 Pressure ulcer of right buttock, stage 3 04/08/2019 No Yes L89.323 Pressure ulcer of left buttock, stage 3 04/08/2019 No Yes L89.213 Pressure ulcer of right hip, stage 3 04/08/2019 No Yes G82.52 Quadriplegia, C1-C4 incomplete 04/08/2019 No Yes E44.0 Moderate protein-calorie malnutrition 04/08/2019 No Yes Inactive Problems Resolved Problems Electronic Signature(s) Signed: 07/18/2019 11:13:03 AM By: Worthy Keeler PA-C Entered By: Worthy Keeler on 07/18/2019 11:13:03 Swiney, [name] ([id number]) -------------------------------------------------------------------------------- Progress Note Details Patient Name: [name] Date of Service: 07/18/2019 10:45 AM Medical Record Number: [id number] Patient Account Number: [id number] Date of Birth/Sex: October 08, 1965 (54 y.o. F) Treating RN: Montey Hora Primary Care Provider: [place], JULIE Other Clinician: Referring Provider: [place], JULIE Treating Provider/Extender: Melburn Hake, Myriam Brandhorst Weeks in Treatment: 14 Subjective Chief Complaint Information obtained from Patient Bilateral gluteal and right hip pressure ulcers History of Present Illness (HPI) 54 year old patient who was been an unreliable historian says she had completely healed in February and most recently her home health noticed that the wound on the right scapular area had reopened. She was in hospital about a month ago with pneumonia at that time a workup was done. We have no notes today but will workup the electronic medical record. on review of her records electronically I do not find any recent hospital MRI in the last 6 months. She had a CT scan of the chest in February 2018 which does not show any osseous lesions. she was admitted to the hospital between May 14 and May 17 for 3 days for mucus plugging and collapse of the left lung. at that time she had a decubitus ulcer of the right scapular region stage III. her history was noted to have quadriplegia C5-C7 incomplete, neurogenic bladder, nephrostomy catheter and emphysema. patient tells me that she has not smoked for last month and a half but as per her hospital records she was smoking in the middle of May. 01/12/17 patient scapular region appears to be doing fairly well on evaluation today. She is having really no syndicate discomfort and has been tolerating the dressing changes well complication. Readmission: 04/08/2019 patient presents today for reevaluation in our clinic concerning issues she is having with wounds on the bilateral gluteal area as well as on the right trochanter. She has unfortunately noted that things seem  to have gotten worse as she was using the Clinitron bed. The patient states it gives off a lot of heat and subsequently has not been [name] for her skin causing her to breakdown more. She has been  trying to get back just to the air mattress but has not been able to get the company to come pick up that bed and bring out the air mattress. Her primary care provider is on this as well trying to get them to do so. Unfortunately her wounds are significantly large and show signs of hyper granulation as well. She has had this kind of issue for quite some time and unfortunately just does not seem to be showing signs of significant improvement with what she has been using currently. No fevers, chills, nausea, vomiting, or diarrhea. She does attempt appropriate offloading and again has been using the bed because that is all she has although she is not happy with it. 04/22/2019 on evaluation today patient appears to be doing about the same with regard to her wounds at this time. Fortunately there does not appear to be any signs of active infection at this point which is [name] news. She has been tolerating the dressing changes without complication. Overall I am pleased with that with that being said unfortunately she does have a little bit of more significant breakdown with regard to the wound bed upon evaluation today. I believe this is secondary to the fact that she has been lying potentially too much in a single position for too long over the past several weeks. I think that potentially if she were to mitigate this to a degree that things may improve somewhat. With that being said she is really not happy with her current bed she is attempted to have them come out and switch this back to an air mattress but unfortunately has not been successful in getting them to come out and make this change. She is very frustrated with that. She still has the Clinitron bed and she much prefers just having the standard air mattress. Patient's primary care provider still working on this she tells me. 05/08/2019 on evaluation today patient unfortunately is not doing as well as I would like to see in regard to her wounds.  She actually is measuring a little bit larger pretty much at all wound locations unfortunately and overall I am very upset as well with the fact that she has been trying to get the Clinitron bed removed from her home but is not having any luck as such. We did get the number for the company today in order for [place] to see about calling them and switching this over to an air mattress as [name], [name] ([id number]) this is giving her a lot of trouble she states is really hot and she feels like it is breaking down her skin. This is not [name]. 05/22/2019 on evaluation today patient appears to be doing well with regard to the wounds she is showing some signs of slight improvement at this point. This is [name] news. Fortunately there is no evidence of active infection at this time which is also [name] news. No fevers, chills, nausea, vomiting, or diarrhea. She does tell me that in order to get rid of the Clinitron bed that she has been working on that the representative from the company stated they needed in order for me asked to be getting rid of it and then what we wanted her to have. Again I will be more than  happy to do that we can initiate an air mattress for her. 06/06/2019 on evaluation today patient continues to have issues with her wounds. The [name] news is we figured out what to be done for the her bad as far as get not moved however she is getting actually ready to move and change residences. For that reason what they are going to do is come in and take the bed from the current residence to get rid of the Clinitron bed and then subsequently move the new bed into her new place of residence. This makes complete sense to me as well and I think that sounds like a [name] plan. At least we have a plan for what to do as far as getting things taken care of here. 06/24/2019 on evaluation today patient appears to be doing really about the same in some regards although a couple of the wounds are measuring a little  bit larger and deeper compared to previous. With that being said I do think that unfortunately her wounds seem to be overall progressing in the wrong direction towards being worse not better. The patient is still having to use the Clinitron bed at this time I do think that she really needs to switch to just a standard air mattress that is what she wants but she is having to wait until she moves in 2 weeks before she can get the new mattress so she does not have to pay to have the bed moved twice. 07/18/2019 on evaluation today patient appears to actually be doing somewhat better in regard to her wounds in general. I am very pleased in this regard. With that being said she still has some hyper granulation unfortunately the Hydrofera Blue just was getting stuck to her and the silver cell has done better. Nonetheless I do believe that we do need to probably treat this with some silver nitrate to try to help with the hypergranulation I discussed this with the patient today. She is in agreement with this plan. 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 For about 4-5 months she's had a decubitus ulcer on the right scapular region. she was lost to follow-up for the last 6 months. she had a couple of small areas which have opened out in the region of previous scar tissue in the sacral region but these haveall healed now. She does not have any other significant comorbidities. She has a [name] Roho cushion for her wheelchair and she has a specialized mattress for sleep. She has restarted smoking and smokes about half a pack of cigarettes a day 02/18/2015 -- X-ray of the pelvis -- IMPRESSION:No acute osseous injury of the pelvis. If there is clinical concern regarding osteomyelitis, recommend further evaluation with CT. X-ray of the sacrum and coccyx IMPRESSION:No acute osseous  abnormality of the sacrum. If there is further clinical concern regarding osteomyelitis of the sacrum, further evaluation with CT is recommended. ========= Objective Constitutional Well-nourished and well-hydrated in no acute distress. Vitals Time Taken: 10:58 AM, Height: 63 in, Weight: 70 lbs, BMI: 12.4, Temperature: 98.4 F, Pulse: 77 bpm, Respiratory Rate: 16 breaths/min, Blood Pressure: 124/90 mmHg. [name], [name] ([id number]) Respiratory normal breathing without difficulty. Psychiatric this patient is able to make decisions and demonstrates [name] insight into disease process. Alert and Oriented x 3. pleasant and cooperative. General Notes: Upon inspection patient's wounds again showed hyper granulation and I  did have to perform chemical cauterization with silver nitrate to help with some of the hyper granular regions. With that being said the patient fortunately already seems to be showing some signs of improvement and I do believe having the new bed is actually helping her she is back to just a standard air mattress. I did treat to the areas with silver nitrate today. Integumentary (Hair, Skin) Wound #5 status is Open. Original cause of wound was Gradually Appeared. The wound is located on the Right Gluteus. The wound measures 4cm length x 1.8cm width x 0.6cm depth; 5.655cm^2 area and 3.393cm^3 volume. There is Fat Layer (Subcutaneous Tissue) Exposed exposed. There is no tunneling noted, however, there is undermining starting at 12:00 and ending at 2:00 with a maximum distance of 1.8cm. There is a large amount of serous drainage noted. The wound margin is flat and intact. There is medium (34-66%) red granulation within the wound bed. There is a medium (34-66%) amount of necrotic tissue within the wound bed including Adherent Slough. Wound #6 status is Open. Original cause of wound was Pressure Injury. The wound is located on the Right Sacrum. The wound measures 8cm length x 8cm  width x 0.2cm depth; 50.265cm^2 area and 10.053cm^3 volume. There is Fat Layer (Subcutaneous Tissue) Exposed exposed. There is no tunneling or undermining noted. There is a large amount of serosanguineous drainage noted. There is medium (34-66%) red granulation within the wound bed. There is a medium (34- 66%) amount of necrotic tissue within the wound bed including Adherent Slough. Wound #7 status is Open. Original cause of wound was Gradually Appeared. The wound is located on the Right Trochanter. The wound measures 5.8cm length x 4.5cm width x 0.6cm depth; 20.499cm^2 area and 12.299cm^3 volume. There is Fat Layer (Subcutaneous Tissue) Exposed exposed. There is no tunneling noted, however, there is undermining starting at 12:00 and ending at 12:00 with a maximum distance of 1.6cm. There is a medium amount of serous drainage noted. The wound margin is flat and intact. There is medium (34-66%) red granulation within the wound bed. There is a medium (34-66%) amount of necrotic tissue within the wound bed including Adherent Slough. 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 An CHEM CAUT GRANULATION TISS procedure was performed by STONE III, Shaya Reddick E., PA-C. [name], [name] ([id number]) Post procedure Diagnosis Wound #6: Same as Pre-Procedure Notes: 4 sticks of silver nitrate used on both wounds Wound #7 Pre-procedure diagnosis of Wound #7 is a Pressure Ulcer located on the Right Trochanter . An CHEM CAUT GRANULATION TISS procedure was performed by STONE III, Meaghann Choo E., PA-C. Post procedure Diagnosis Wound #7: Same as Pre-Procedure Notes: 4 sticks of silver nitrate used on both wounds Plan Wound  Cleansing: Wound #5 Right Gluteus: Clean wound with Normal Saline. May Shower, gently pat wound dry prior to applying new dressing. Wound #6 Right Sacrum: Clean wound with Normal Saline. May Shower, gently pat wound dry prior to applying new dressing. Wound #7 Right Trochanter: Clean wound with Normal Saline. May Shower, gently pat wound dry prior to applying new dressing. Primary Wound Dressing: Wound #5 Right Gluteus: Silver Alginate - Silvercel Non Adherent specifically. Wet the dressing for removal during dressing change. Wound #6 Right Sacrum: Silver Alginate - Silvercel  Non Adherent specifically. Wet the dressing for removal during dressing change. Wound #7 Right Trochanter: Silver Alginate - Silvercel Non Adherent specifically. Wet the dressing for removal during dressing change. Secondary Dressing: Wound #5 Right Gluteus: Boardered Foam Dressing Wound #6 Right Sacrum: Boardered Foam Dressing Wound #7 Right Trochanter: Boardered Foam Dressing Dressing Change Frequency: Wound #5 Right Gluteus: Change Dressing Monday, Wednesday, Friday - or three times weekly Wound #6 Right Sacrum: Change Dressing Monday, Wednesday, Friday - or three times weekly Wound #7 Right Trochanter: Change Dressing Monday, Wednesday, Friday - or three times weekly Follow-up Appointments: Return Appointment in 1 week. Off-Loading: Wound #5 Right Gluteus: Mattress - air mattress or fluidized air mattress Turn and reposition every 2 hours Wound #6 Right Sacrum: Mattress - air mattress or fluidized air mattress Turn and reposition every 2 hours Wound #7 Right Trochanter: Mattress - air mattress or fluidized air mattress Turn and reposition every 2 hours Additional Orders / InstructionsFRANNY, SELVAGE. ([id number]) Wound #5 Right Gluteus: Increase protein intake. Wound #6 Right Sacrum: Increase protein intake. Wound #7 Right Trochanter: Increase protein intake. Home Health: Wound #5 Right  Gluteus: Continue Home Health Visits - [facility] - Please provide dressing materials for patient Home Health Nurse may visit PRN to address patient s wound care needs. FACE TO FACE ENCOUNTER: MEDICARE and MEDICAID PATIENTS: I certify that this patient is under my care and that I had a face-to-face encounter that meets the physician face-to-face encounter requirements with this patient on this date. The encounter with the patient was in whole or in part for the following MEDICAL CONDITION: (primary reason for Home Healthcare) MEDICAL NECESSITY: I certify, that based on my findings, NURSING services are a medically necessary home health service. HOME BOUND STATUS: I certify that my clinical findings support that this patient is homebound (i.e., Due to illness or injury, pt requires aid of supportive devices such as crutches, cane, wheelchairs, walkers, the use of special transportation or the assistance of another person to leave their place of residence. There is a normal inability to leave the home and doing so requires considerable and taxing effort. Other absences are for medical reasons / religious services and are infrequent or of short duration when for other reasons). If current dressing causes regression in wound condition, may D/C ordered dressing product/s and apply Normal Saline Moist Dressing daily until next Wound Healing Center / Other MD appointment. Notify Wound Healing Center of regression in wound condition at [phone]. Please direct any NON-WOUND related issues/requests for orders to patient's Primary Care Physician Wound #6 Right Sacrum: Continue Home Health Visits - [facility] - Please provide dressing materials for patient Home Health Nurse may visit PRN to address patient s wound care needs. FACE TO FACE ENCOUNTER: MEDICARE and MEDICAID PATIENTS: I certify that this patient is under my care and that I had a face-to-face encounter that meets the physician face-to-face  encounter requirements with this patient on this date. The encounter with the patient was in whole or in part for the following MEDICAL CONDITION: (primary reason for Home Healthcare) MEDICAL NECESSITY: I certify, that based on my findings, NURSING services are a medically necessary home health service. HOME BOUND STATUS: I certify that my clinical findings support that this patient is homebound (i.e., Due to illness or injury, pt requires aid of supportive devices such as crutches, cane, wheelchairs, walkers, the use of special transportation or the assistance of another person to leave their place of residence. There is a normal inability  to leave the home and doing so requires considerable and taxing effort. Other absences are for medical reasons / religious services and are infrequent or of short duration when for other reasons). If current dressing causes regression in wound condition, may D/C ordered dressing product/s and apply Normal Saline Moist Dressing daily until next Wound Healing Center / Other MD appointment. Notify Wound Healing Center of regression in wound condition at [phone]. Please direct any NON-WOUND related issues/requests for orders to patient's Primary Care Physician Wound #7 Right Trochanter: Continue Home Health Visits - [facility] - Please provide dressing materials for patient Home Health Nurse may visit PRN to address patient s wound care needs. FACE TO FACE ENCOUNTER: MEDICARE and MEDICAID PATIENTS: I certify that this patient is under my care and that I had a face-to-face encounter that meets the physician face-to-face encounter requirements with this patient on this date. The encounter with the patient was in whole or in part for the following MEDICAL CONDITION: (primary reason for Home Healthcare) MEDICAL NECESSITY: I certify, that based on my findings, NURSING services are a medically necessary home health service. HOME BOUND STATUS: I certify that my  clinical findings support that this patient is homebound (i.e., Due to illness or injury, pt requires aid of supportive devices such as crutches, cane, wheelchairs, walkers, the use of special transportation or the assistance of another person to leave their place of residence. There is a normal inability to leave the home and doing so requires considerable and taxing effort. Other absences are for medical reasons / religious services and are infrequent or of short duration when for other reasons). If current dressing causes regression in wound condition, may D/C ordered dressing product/s and apply Normal Saline Moist Dressing daily until next Wound Healing Center / Other MD appointment. Notify Wound Healing Center of regression in wound condition at [phone]. Please direct any NON-WOUND related issues/requests for orders to patient's Primary Care Physician SEBA, [name] ([id number]) 1. My suggestion at this time is [name] to be that we go ahead and continue with the current wound care measures at this point which includes the silver alginate dressing to all wound locations specifically silver cell. 2. I did perform chemical cauterization at 2 of the 3 locations in order to help with hyper granular tissue at the sites. The patient tolerated that today without complication. 3. I am again recommending that she continue with the air mattress at this point since that seems to be doing well and hopeful that we will continue to show signs of improvement with the new mattress at this point. 4. Patient is getting ready to move this weekend as well it sounds like to her new residence we will see how things do following and I would like to recheck with her next week. We will see patient back for reevaluation in 1 week here in the clinic. If anything worsens or changes patient will contact our office for additional recommendations. Electronic Signature(s) Signed: 07/18/2019 11:33:55 AM By: Lenda Kelp PA-C Entered By: Lenda Kelp on 07/18/2019 11:33:55 Totino, [name] [name] ([id number]) -------------------------------------------------------------------------------- SuperBill Details Patient Name: [name] Date of Service: 07/18/2019 Medical Record Number: [id number] Patient Account Number: [id number] Date of Birth/Sex: 04/04/1966 (54 y.o. F) Treating RN: Curtis Sites Primary Care Provider: [place], JULIE Other Clinician: Referring Provider: [place], JULIE Treating Provider/Extender: STONE III, Napolean Sia Weeks in Treatment: 14 Diagnosis Coding ICD-10 Codes Code Description L89.313 Pressure ulcer of right buttock, stage 3 L89.323 Pressure ulcer of left  buttock, stage 3 L89.213 Pressure ulcer of right hip, stage 3 G82.52 Quadriplegia, C1-C4 incomplete E44.0 Moderate protein-calorie malnutrition Facility Procedures CPT4 Code: [id number] Description: 17250 - CHEM CAUT GRANULATION TISS ICD-10 Diagnosis Description L89.313 Pressure ulcer of right buttock, stage 3 L89.213 Pressure ulcer of right hip, stage 3 Modifier: Quantity: 2 Physician Procedures CPT4 Code: [id number] Description: 17250 - WC PHYS CHEM CAUT GRAN TISSUE ICD-10 Diagnosis Description L89.313 Pressure ulcer of right buttock, stage 3 L89.213 Pressure ulcer of right hip, stage 3 Modifier: Quantity: 2 Electronic Signature(s) Signed: 07/18/2019 11:34:38 AM By: Lenda Kelp PA-C Entered By: Lenda Kelp on 07/18/2019 11:34:37

## 2019-07-18 NOTE — Progress Notes (Signed)
ELYNA, PANGILINAN ([id number]) Visit Report for 07/18/2019 Arrival Information Details Patient Name: [name], [name] Date of Service: 07/18/2019 10:45 AM Medical Record Number: [id number] Patient Account Number: [id number] Date of Birth/Sex: 1966/03/17 (54 y.o. F) Treating RN: Rodell Perna Primary Care Ersilia Brawley: [place], [place] Other Clinician: Referring Blaike Newburn: [place] Treating Chauntelle Azpeitia/Extender: Linwood Dibbles, HOYT Weeks in Treatment: 14 Visit Information History Since Last Visit Added or deleted any medications: No Patient Arrived: Wheel Chair Any new allergies or adverse reactions: No Arrival Time: 10:58 Had a fall or experienced change in No activities of daily living that may affect Accompanied By: family risk of falls: Transfer Assistance: None Signs or symptoms of abuse/neglect since last visito No Patient Identification Verified: Yes Hospitalized since last visit: No Patient Requires Transmission-Based No Has Dressing in Place as Prescribed: Yes Precautions: Pain Present Now: No Patient Has Alerts: No Electronic Signature(s) Signed: 07/18/2019 11:38:50 AM By: Rodell Perna Entered By: Rodell Perna on 07/18/2019 10:58:51 [name], [name] ([id number]) -------------------------------------------------------------------------------- Encounter Discharge Information Details Patient Name: [name], [name] Date of Service: 07/18/2019 10:45 AM Medical Record Number: [id number] Patient Account Number: [id number] Date of Birth/Sex: March 22, 1966 (54 y.o. F) Treating RN: Curtis Sites Primary Care Kaliah Haddaway: [place], [place] Other Clinician: Referring Narvel Kozub: [place] Treating Eartha Vonbehren/Extender: Linwood Dibbles, HOYT Weeks in Treatment: 14 Encounter Discharge Information Items Discharge Condition: Stable Ambulatory Status: Wheelchair Discharge Destination: Home Transportation: Private Auto Accompanied By: spouse Schedule Follow-up Appointment: Yes Clinical Summary of  Care: Electronic Signature(s) Signed: 07/18/2019 12:14:17 PM By: Curtis Sites Entered By: Curtis Sites on 07/18/2019 11:23:57 [name], [name] ([id number]) -------------------------------------------------------------------------------- Lower Extremity Assessment Details Patient Name: [name], [name] Date of Service: 07/18/2019 10:45 AM Medical Record Number: [id number] Patient Account Number: [id number] Date of Birth/Sex: [birth year] (54 y.o. F) Treating RN: Rodell Perna Primary Care Evamae Rowen: [place], [place] Other Clinician: Referring Brynden Thune: [place], [place] Treating Damacio Weisgerber/Extender: Linwood Dibbles, HOYT Weeks in Treatment: 14 Electronic Signature(s) Signed: 07/18/2019 11:38:50 AM By: Rodell Perna Entered By: Rodell Perna on 07/18/2019 11:09:00 [name], [name] ([id number]) -------------------------------------------------------------------------------- Multi Wound Chart Details Patient Name: [name], [name] Date of Service: 07/18/2019 10:45 AM Medical Record Number: [id number] Patient Account Number: [id number] Date of Birth/Sex: May 24, 1966 (54 y.o. F) Treating RN: Curtis Sites Primary Care Syreeta Figler: [place], [place] Other Clinician: Referring Ambyr Qadri: [place] Treating Zakiah Beckerman/Extender: STONE III, HOYT Weeks in Treatment: 14 Vital Signs Height(in): 63 Pulse(bpm): 77 Weight(lbs): 70 Blood Pressure(mmHg): 124/90 Body Mass Index(BMI): 12 Temperature(F): 98.4 Respiratory Rate 16 (breaths/min): Photos: Wound Location: Right Gluteus Right Sacrum Right Trochanter Wounding Event: Gradually Appeared Pressure Injury Gradually Appeared Primary Etiology: Pressure Ulcer Pressure Ulcer Pressure Ulcer Comorbid History: Asthma, Chronic Obstructive Asthma, Chronic Obstructive Asthma, Chronic Obstructive Pulmonary Disease (COPD), Pulmonary Disease (COPD), Pulmonary Disease (COPD), History of pressure wounds, History of pressure wounds, History of pressure wounds, Paraplegia  Paraplegia Paraplegia Date Acquired: 10/16/2018 10/16/2018 04/02/2018 Weeks of Treatment: 14 14 14  Wound Status: Open Open Open Clustered Wound: Yes No No Clustered Quantity: 3 N/A N/A Measurements L x W x D 4x1.8x0.6 8x8x0.2 5.8x4.5x0.6 (cm) Area (cm) : 5.655 50.265 20.499 Volume (cm) : 3.393 10.053 12.299 % Reduction in Area: 88.00% -42497.50% -30.50% % Reduction in Volume: 89.70% -83675.00% 13.00% Starting Position 1 12 12  (o'clock): Ending Position 1 2 12  (o'clock): Maximum Distance 1 (cm): 1.8 1.6 Undermining: Yes No Yes Classification: Category/Stage III Category/Stage III Category/Stage III Exudate Amount: Large Large Medium Exudate Type: Serous Serosanguineous Serous Exudate Color: amber red, brown amber Wound Margin: Flat and Intact N/A Flat and Intact  [name], GRANDMAISON. ([id number]) Granulation Amount: Medium (34-66%) Medium (34-66%) Medium (34-66%) Granulation Quality: Red Red Red Necrotic Amount: Medium (34-66%) Medium (34-66%) Medium (34-66%) Exposed Structures: Fat Layer (Subcutaneous Fat Layer (Subcutaneous Fat Layer (Subcutaneous Tissue) Exposed: Yes Tissue) Exposed: Yes Tissue) Exposed: Yes Fascia: No Fascia: No Fascia: No Tendon: No Tendon: No Tendon: No Muscle: No Muscle: No Muscle: No Joint: No Joint: No Joint: No Bone: No Bone: No Bone: No Epithelialization: Small (1-33%) N/A Small (1-33%) Treatment Notes Electronic Signature(s) Signed: 07/18/2019 12:14:17 PM By: Curtis Sites Entered By: Curtis Sites on 07/18/2019 11:16:23 [name], [name] ([id number]) -------------------------------------------------------------------------------- Multi-Disciplinary Care Plan Details Patient Name: [name], [name] Date of Service: 07/18/2019 10:45 AM Medical Record Number: [id number] Patient Account Number: [id number] Date of Birth/Sex: 05/19/1966 (54 y.o. F) Treating RN: Curtis Sites Primary Care Clova Morlock: [place], [place] Other Clinician: Referring  Elazar Argabright: [place], [place] Treating Meagan Spease/Extender: STONE III, HOYT Weeks in Treatment: 14 Active Inactive Abuse / Safety / Falls / Self Care Management Nursing Diagnoses: Impaired physical mobility Goals: Patient will not develop complications from immobility Date Initiated: 04/08/2019 Target Resolution Date: 07/12/2019 Goal Status: Active Interventions: Assess fall risk on admission and as needed Notes: Nutrition Nursing Diagnoses: Potential for alteratiion in Nutrition/Potential for imbalanced nutrition Goals: Patient/caregiver agrees to and verbalizes understanding of need to use nutritional supplements and/or vitamins as prescribed Date Initiated: 04/08/2019 Target Resolution Date: 07/12/2019 Goal Status: Active Interventions: Assess patient nutrition upon admission and as needed per policy Notes: Orientation to the Wound Care Program Nursing Diagnoses: Knowledge deficit related to the wound healing center program Goals: Patient/caregiver will verbalize understanding of the Wound Healing Center Program Date Initiated: 04/08/2019 Target Resolution Date: 07/12/2019 Goal Status: Active Interventions: Provide education on orientation to the wound center [name], [name] ([id number]) Notes: Pressure Nursing Diagnoses: Knowledge deficit related to management of pressures ulcers Goals: Patient will remain free of pressure ulcers Date Initiated: 04/08/2019 Target Resolution Date: 07/12/2019 Goal Status: Active Interventions: Assess potential for pressure ulcer upon admission and as needed Notes: Wound/Skin Impairment Nursing Diagnoses: Impaired tissue integrity Goals: Ulcer/skin breakdown will heal within 14 weeks Date Initiated: 04/08/2019 Target Resolution Date: 07/12/2019 Goal Status: Active Interventions: Assess patient/caregiver ability to obtain necessary supplies Assess patient/caregiver ability to perform ulcer/skin care regimen upon admission and as needed Assess  ulceration(s) every visit Notes: Electronic Signature(s) Signed: 07/18/2019 12:14:17 PM By: Curtis Sites Entered By: Curtis Sites on 07/18/2019 11:16:16 Dubie, [name] ([id number]) -------------------------------------------------------------------------------- Pain Assessment Details Patient Name: [name] Masters. Date of Service: 07/18/2019 10:45 AM Medical Record Number: [id number] Patient Account Number: [id number] Date of Birth/Sex: 30-Aug-1965 (54 y.o. F) Treating RN: Rodell Perna Primary Care Marzetta Lanza: [place], [place] Other Clinician: Referring Adan Beal: [place], [place] Treating Lysette Lindenbaum/Extender: STONE III, HOYT Weeks in Treatment: 14 Active Problems Location of Pain Severity and Description of Pain Patient Has Paino No Site Locations Pain Management and Medication Current Pain Management: Electronic Signature(s) Signed: 07/18/2019 11:38:50 AM By: Rodell Perna Entered By: Rodell Perna on 07/18/2019 10:58:57 Hyneman, [name] ([id number]) -------------------------------------------------------------------------------- Patient/Caregiver Education Details Patient Name: [name], LLAMAS. Date of Service: 07/18/2019 10:45 AM Medical Record Number: [id number] Patient Account Number: [id number] Date of Birth/Gender: [birth year] (54 y.o. F) Treating RN: Curtis Sites Primary Care Physician: [place], [place] Other Clinician: Referring Physician: [place] Treating Physician/Extender: Skeet Simmer in Treatment: 14 Education Assessment Education Provided To: Patient and Caregiver Education Topics Provided Wound Debridement: Handouts: Other: use of silver nitrate for hypergranulation tissue Methods: Explain/Verbal Responses: State content correctly Electronic Signature(s) Signed:  07/18/2019 12:14:17 PM By: Curtis Sites Entered By: Curtis Sites on 07/18/2019 11:23:21 Shapley, [name]  ([id number]) -------------------------------------------------------------------------------- Wound Assessment Details Patient Name: [name], [name] Date of Service: 07/18/2019 10:45 AM Medical Record Number: [id number] Patient Account Number: [id number] Date of Birth/Sex: 10/03/1965 (54 y.o. F) Treating RN: Rodell Perna Primary Care Amarachukwu Lakatos: [place], [place] Other Clinician: Referring Forbes Loll: [place], [place] Treating Jireh Elmore/Extender: STONE III, HOYT Weeks in Treatment: 14 Wound Status Wound Number: 5 Primary Pressure Ulcer Etiology: Wound Location: Right Gluteus Wound Open Wounding Event: Gradually Appeared Status: Date Acquired: 10/16/2018 Comorbid Asthma, Chronic Obstructive Pulmonary Weeks Of Treatment: 14 History: Disease (COPD), History of pressure wounds, Clustered Wound: Yes Paraplegia Photos Wound Measurements Length: (cm) 4 % Reduction i Width: (cm) 1.8 % Reduction i Depth: (cm) 0.6 Epithelializa Clustered Quantity: 3 Tunneling: Area: (cm) 5.655 Undermining: Volume: (cm) 3.393 Starting Ending Pos Maximum Di n Area: 88% n Volume: 89.7% tion: Small (1-33%) No Yes Position (o'clock): 12 ition (o'clock): 2 stance: (cm) 1.8 Wound Description Classification: Category/Stage III Foul Odor Aft Wound Margin: Flat and Intact Slough/Fibrin Exudate Amount: Large Exudate Type: Serous Exudate Color: amber er Cleansing: No o Yes Wound Bed Granulation Amount: Medium (34-66%) Exposed Structure Granulation Quality: Red Fascia Exposed: No Necrotic Amount: Medium (34-66%) Fat Layer (Subcutaneous Tissue) Exposed: Yes Necrotic Quality: Adherent Slough Tendon Exposed: No Muscle Exposed: No Joint Exposed: No Rylander, Ashlinn M. ([id number]) Bone Exposed: No Treatment Notes Wound #5 (Right Gluteus) Notes silvercel, BFD Electronic Signature(s) Signed: 07/18/2019 11:38:50 AM By: Rodell Perna Entered By: Rodell Perna on 07/18/2019 11:05:38 Michael, Kennadee Judie Petit  ([id number]) -------------------------------------------------------------------------------- Wound Assessment Details Patient Name: [name] Masters. Date of Service: 07/18/2019 10:45 AM Medical Record Number: [id number] Patient Account Number: [id number] Date of Birth/Sex: 17-Aug-1965 (54 y.o. F) Treating RN: Rodell Perna Primary Care Persia Lintner: [place], [place] Other Clinician: Referring Oriyah Lamphear: [place], [place] Treating Kiondre Grenz/Extender: STONE III, HOYT Weeks in Treatment: 14 Wound Status Wound Number: 6 Primary Pressure Ulcer Etiology: Wound Location: Right Sacrum Wound Open Wounding Event: Pressure Injury Status: Date Acquired: 10/16/2018 Comorbid Asthma, Chronic Obstructive Pulmonary Weeks Of Treatment: 14 History: Disease (COPD), History of pressure wounds, Clustered Wound: No Paraplegia Photos Wound Measurements Length: (cm) 8 % Reduction in Width: (cm) 8 % Reduction in Depth: (cm) 0.2 Tunneling: Area: (cm) 50.265 Undermining: Volume: (cm) 10.053 Area: -42497.5% Volume: -83675% No No Wound Description Classification: Category/Stage III Foul Odor After Exudate Amount: Large Slough/Fibrino Exudate Type: Serosanguineous Exudate Color: red, brown Cleansing: No Yes Wound Bed Granulation Amount: Medium (34-66%) Exposed Structure Granulation Quality: Red Fascia Exposed: No Necrotic Amount: Medium (34-66%) Fat Layer (Subcutaneous Tissue) Exposed: Yes Necrotic Quality: Adherent Slough Tendon Exposed: No Muscle Exposed: No Joint Exposed: No Bone Exposed: No Treatment Notes Wound #6 (Right Sacrum) Caldeira, Fynlee M. ([id number]) Notes silvercel, BFD Electronic Signature(s) Signed: 07/18/2019 11:38:50 AM By: Rodell Perna Entered By: Rodell Perna on 07/18/2019 11:06:53 Cybulski, [name] ([id number]) -------------------------------------------------------------------------------- Wound Assessment Details Patient Name: [name] Masters. Date of Service: 07/18/2019  10:45 AM Medical Record Number: [id number] Patient Account Number: [id number] Date of Birth/Sex: [birth year] (54 y.o. F) Treating RN: Rodell Perna Primary Care Shikha Bibb: [place], [place] Other Clinician: Referring Ndeye Tenorio: [place], [place] Treating Aaran Enberg/Extender: STONE III, HOYT Weeks in Treatment: 14 Wound Status Wound Number: 7 Primary Pressure Ulcer Etiology: Wound Location: Right Trochanter Wound Open Wounding Event: Gradually Appeared Status: Date Acquired: 04/02/2018 Comorbid Asthma, Chronic Obstructive Pulmonary Weeks Of Treatment: 14 History: Disease (COPD), History of pressure wounds, Clustered Wound: No Paraplegia Photos Wound Measurements Length: (cm) 5.8 %  Reduction in A Width: (cm) 4.5 % Reduction in V Depth: (cm) 0.6 Epithelializatio Area: (cm) 20.499 Tunneling: Volume: (cm) 12.299 Undermining: Starting Posi Ending Positi Maximum Dista rea: -30.5% olume: 13% n: Small (1-33%) No Yes tion (o'clock): 12 on (o'clock): 12 nce: (cm) 1.6 Wound Description Classification: Category/Stage III Foul Odor After Wound Margin: Flat and Intact Slough/Fibrino Exudate Amount: Medium Exudate Type: Serous Exudate Color: amber Cleansing: No No Wound Bed Granulation Amount: Medium (34-66%) Exposed Structure Granulation Quality: Red Fascia Exposed: No Necrotic Amount: Medium (34-66%) Fat Layer (Subcutaneous Tissue) Exposed: Yes Necrotic Quality: Adherent Slough Tendon Exposed: No Muscle Exposed: No Joint Exposed: No Eisman, Jax M. ([id number]) Bone Exposed: No Treatment Notes Wound #7 (Right Trochanter) Notes silvercel, BFD Electronic Signature(s) Signed: 07/18/2019 11:38:50 AM By: Army Melia Entered By: Army Melia on 07/18/2019 11:08:38 Miler, [name] ([id number]) -------------------------------------------------------------------------------- Vitals Details Patient Name: Graylon Good. Date of Service: 07/18/2019 10:45 AM Medical Record  Number: [id number] Patient Account Number: [id number] Date of Birth/Sex: 1966-02-13 (54 y.o. F) Treating RN: Army Melia Primary Care Donte Kary: [place], [place] Other Clinician: Referring Janeal Abadi: [place], [place] Treating Dewane Timson/Extender: STONE III, HOYT Weeks in Treatment: 14 Vital Signs Time Taken: 10:58 Temperature (F): 98.4 Height (in): 63 Pulse (bpm): 77 Weight (lbs): 70 Respiratory Rate (breaths/min): 16 Body Mass Index (BMI): 12.4 Blood Pressure (mmHg): 124/90 Reference Range: 80 - 120 mg / dl Electronic Signature(s) Signed: 07/18/2019 11:38:50 AM By: Army Melia Entered By: Army Melia on 07/18/2019 10:59:44

## 2019-07-24 ENCOUNTER — Ambulatory Visit: Payer: Medicare Other | Admitting: Physician Assistant

## 2019-07-29 ENCOUNTER — Other Ambulatory Visit: Payer: Self-pay

## 2019-07-29 ENCOUNTER — Encounter: Payer: Medicare Other | Admitting: Physician Assistant

## 2019-07-29 DIAGNOSIS — L89313 Pressure ulcer of right buttock, stage 3: Secondary | ICD-10-CM | POA: Diagnosis not present

## 2019-07-29 NOTE — Progress Notes (Addendum)
[name], [name] ([id number]) Visit Report for 07/29/2019 Arrival Information Details Patient Name: [name], [name] Date of Service: 07/29/2019 2:45 PM Medical Record Number: [id number] Patient Account Number: [id number] Date of Birth/Sex: Oct 31, 1965 (54 y.o. F) Treating RN: [name] Primary Care [name]: [place], [name] Other Clinician: Referring [name]: [place], [name] [name] [name]: [name], [name] Weeks in Treatment: 16 Visit Information History Since Last Visit Added or deleted any medications: No Patient Arrived: Wheel Chair Any new allergies or adverse reactions: No Arrival Time: 15:01 Had a fall or experienced change in No activities of daily living that may affect Accompanied By: self risk of falls: Transfer Assistance: None Signs or symptoms of abuse/neglect since last visito No Patient Identification Verified: Yes Hospitalized since last visit: No Patient Requires Transmission-Based No Has Dressing in Place as Prescribed: Yes Precautions: Pain Present Now: No Patient Has Alerts: No Electronic Signature(s) Signed: 07/29/2019 4:47:52 PM By: [name] Entered By: [name] on 07/29/2019 15:01:24 [name], [name] ([id number]) -------------------------------------------------------------------------------- Clinic Level of Care Assessment Details Patient Name: [name] Date of Service: 07/29/2019 2:45 PM Medical Record Number: [id number] Patient Account Number: [id number] Date of Birth/Sex: 1966-03-12 (54 y.o. F) Treating RN: [name] Primary Care [name]: [place], [name] Other Clinician: Referring [name]: [place], [name] [name] Vincenzo Stave/Extender: [name], [name] Weeks in Treatment: 16 Clinic Level of Care Assessment Items TOOL 4 Quantity Score []  - Use when only an EandM is performed on FOLLOW-UP visit 0 ASSESSMENTS - Nursing Assessment / Reassessment X - Reassessment of Co-morbidities (includes updates in patient status) 1  10 X- 1 5 Reassessment of Adherence to Treatment Plan ASSESSMENTS - Wound and Skin Assessment / Reassessment []  - Simple Wound Assessment / Reassessment - one wound 0 X- 3 5 Complex Wound Assessment / Reassessment - multiple wounds []  - 0 Dermatologic / Skin Assessment (not related to wound area) ASSESSMENTS - Focused Assessment []  - Circumferential Edema Measurements - multi extremities 0 []  - 0 Nutritional Assessment / Counseling / Intervention []  - 0 Lower Extremity Assessment (monofilament, tuning fork, pulses) []  - 0 Peripheral Arterial Disease Assessment (using hand held doppler) ASSESSMENTS - Ostomy and/or Continence Assessment and Care []  - Incontinence Assessment and Management 0 []  - 0 Ostomy Care Assessment and Management (repouching, etc.) PROCESS - Coordination of Care X - Simple Patient / Family Education for ongoing care 1 15 []  - 0 Complex (extensive) Patient / Family Education for ongoing care []  - 0 Staff obtains , Records, Test Results / Process Orders []  - 0 Staff telephones HHA, Nursing Homes / Clarify orders / etc []  - 0 Routine Transfer to another Facility (non-emergent condition) []  - 0 Routine Hospital Admission (non-emergent condition) []  - 0 New Admissions / / Ordering NPWT, Apligraf, etc. []  - 0 Emergency Hospital Admission (emergent condition) X- 1 10 Simple Discharge Coordination [name], [name] ( ) []  - 0 Complex (extensive) Discharge Coordination PROCESS - Special Needs []  - Pediatric / Minor Patient Management 0 []  - 0 Isolation Patient Management []  - 0 Hearing / Language / Visual special needs []  - 0 Assessment of Community assistance (transportation, D/C planning, etc.) []  - 0 Additional assistance / Altered mentation []  - 0 Support Surface(s) Assessment (bed, cushion, seat, etc.) INTERVENTIONS - Wound Cleansing / Measurement []  - Simple Wound Cleansing - one wound 0 X- 2 5 Complex  Wound Cleansing - multiple wounds X- 1 5 Wound Imaging (photographs - any number of wounds) []  - 0 Wound Tracing (instead of photographs) []  -  0 Simple Wound Measurement - one wound X- 2 5 Complex Wound Measurement - multiple wounds INTERVENTIONS - Wound Dressings []  - Small Wound Dressing one or multiple wounds 0 X- 2 15 Medium Wound Dressing one or multiple wounds []  - 0 Large Wound Dressing one or multiple wounds []  - 0 Application of Medications - topical []  - 0 Application of Medications - injection INTERVENTIONS - Miscellaneous []  - External ear exam 0 []  - 0 Specimen Collection (cultures, biopsies, blood, body fluids, etc.) []  - 0 Specimen(s) / Culture(s) sent or taken to Lab for analysis []  - 0 Patient Transfer (multiple staff / Civil Service fast streamer / Similar devices) []  - 0 Simple Staple / Suture removal (25 or less) []  - 0 Complex Staple / Suture removal (26 or more) []  - 0 Hypo / Hyperglycemic Management (close monitor of Blood Glucose) []  - 0 Ankle / Brachial Index (ABI) - do not check if billed separately X- 1 5 Vital Signs [name], [name] ([id number]) Has the patient been seen at the hospital within the last three years: Yes Total Score: 115 Level Of Care: New/Established - Level 3 Electronic Signature(s) Signed: 07/29/2019 4:47:52 PM By: [name] Entered By: [name] on 07/29/2019 15:20:29 [name], [name] ([id number]) -------------------------------------------------------------------------------- Encounter Discharge Information Details Patient Name: [name], [name] Date of Service: 07/29/2019 2:45 PM Medical Record Number: [id number] Patient Account Number: [id number] Date of Birth/Sex: 1966-06-09 (54 y.o. F) Treating RN: [name] Primary Care Mikal Blasdell: [place], [name] Other Clinician: Referring Pharaoh Pio: [place], [name] [name] Razan Siler/Extender: [name], [name] Weeks in Treatment: 16 Encounter Discharge Information Items Discharge  Condition: Stable Ambulatory Status: Wheelchair Discharge Destination: Home Transportation: Private Auto Accompanied By: spouse Schedule Follow-up Appointment: Yes Clinical Summary of Care: Electronic Signature(s) Signed: 07/29/2019 4:47:52 PM By: [name] Entered By: [name] on 07/29/2019 15:21:26 Bantz, [name] ([id number]) -------------------------------------------------------------------------------- Lower Extremity Assessment Details Patient Name: [name], [name] Date of Service: 07/29/2019 2:45 PM Medical Record Number: [id number] Patient Account Number: [id number] Date of Birth/Sex: 08-14-1965 (54 y.o. F) Treating RN: [name] Primary Care Kora Groom: [place], [name] Other Clinician: Referring Norena Bratton: [place], [name] [name] Leo Fray/Extender: [name], [name] Weeks in Treatment: 16 Electronic Signature(s) Signed: 07/29/2019 4:47:52 PM By: [name] Entered By: [name] on 07/29/2019 15:13:15 S[place]ett, [name] ([id number]) -------------------------------------------------------------------------------- Multi Wound Chart Details Patient Name: [name], [name] Date of Service: 07/29/2019 2:45 PM Medical Record Number: [id number] Patient Account Number: [id number] Date of Birth/Sex: [birth year] (54 y.o. F) Treating RN: [name] Primary Care Zainab Crumrine: [place], [name] Other Clinician: Referring Landis Cassaro: [place], [name] [name] Lupie Sawa/Extender: STONE III, [name] Weeks in Treatment: 16 Vital Signs Height(in): 63 Pulse(bpm): 100 Weight(lbs): 70 Blood Pressure(mmHg): 100/62 Body Mass Index(BMI): 12 Temperature(F): 99.5 Respiratory Rate 16 (breaths/min): Photos: [6:No Photos] Wound Location: Right Sacrum Right Sacrum Right Trochanter Wounding Event: Gradually Appeared Pressure Injury Gradually Appeared Primary Etiology: Pressure Ulcer Pressure Ulcer Pressure Ulcer Comorbid History: Asthma, Chronic Obstructive N/A Asthma, Chronic  Obstructive Pulmonary Disease (COPD), Pulmonary Disease (COPD), History of pressure wounds, History of pressure wounds, Paraplegia Paraplegia Date Acquired: 10/16/2018 10/16/2018 04/02/2018 Weeks of Treatment: 16 16 16  Wound Status: Open Converted Open Clustered Wound: Yes No No Clustered Quantity: 3 N/A N/A Measurements L x W x D 4x8x0.3 8x8x0.2 6x4.5x0.6 (cm) Area (cm) : 25.133 50.265 21.206 Volume (cm) : 7.54 10.053 12.723 % Reduction in Area: 46.70% -42497.50% -35.00% % Reduction in Volume: 77.10% -83675.00% 10.00% Classification: Category/Stage III Category/Stage III Category/Stage III Exudate Amount: Large N/A Medium Exudate Type: Serous N/A Serous  Exudate Color: amber N/A amber Wound Margin: Flat and Intact N/A Flat and Intact Granulation Amount: Medium (34-66%) N/A Medium (34-66%) Granulation Quality: Red N/A Red Necrotic Amount: Medium (34-66%) N/A Medium (34-66%) Exposed Structures: Fat Layer (Subcutaneous N/A Fat Layer (Subcutaneous Tissue) Exposed: Yes Tissue) Exposed: Yes Fascia: No Fascia: No Mayo, Aniela M. ([id number]) Tendon: No Tendon: No Muscle: No Muscle: No Joint: No Joint: No Bone: No Bone: No Epithelialization: Small (1-33%) N/A Small (1-33%) Treatment Notes Electronic Signature(s) Signed: 07/29/2019 4:47:52 PM By: [name] Entered By: [name] on 07/29/2019 15:16:41 [place], [name] ([id number]) -------------------------------------------------------------------------------- Multi-Disciplinary Care Plan Details Patient Name: [name], [name] Date of Service: 07/29/2019 2:45 PM Medical Record Number: [id number] Patient Account Number: [id number] Date of Birth/Sex: [birth year] (54 y.o. F) Treating RN: [name] Primary Care Syona Wroblewski: [place], [name] Other Clinician: Referring Fallynn Gravett: [place], [name] [name] Taler Kushner/Extender: [name], [name] Weeks in Treatment: 16 Active Inactive Abuse / Safety / Falls / Self Care  Management Nursing Diagnoses: Impaired physical mobility Goals: Patient will not develop complications from immobility Date Initiated: 04/08/2019 Target Resolution Date: 07/12/2019 Goal Status: Active Interventions: Assess fall risk on admission and as needed Notes: Nutrition Nursing Diagnoses: Potential for alteratiion in Nutrition/Potential for imbalanced nutrition Goals: Patient/caregiver agrees to and verbalizes understanding of need to use nutritional supplements and/or vitamins as prescribed Date Initiated: 04/08/2019 Target Resolution Date: 07/12/2019 Goal Status: Active Interventions: Assess patient nutrition upon admission and as needed per policy Notes: Pressure Nursing Diagnoses: Knowledge deficit related to management of pressures ulcers Goals: Patient will remain free of pressure ulcers Date Initiated: 04/08/2019 Target Resolution Date: 07/12/2019 Goal Status: Active Interventions: Assess potential for pressure ulcer upon admission and as needed LADAJAH, SOLTYS ([id number]) Notes: Wound/Skin Impairment Nursing Diagnoses: Impaired tissue integrity Goals: Ulcer/skin breakdown will heal within 14 weeks Date Initiated: 04/08/2019 Target Resolution Date: 07/12/2019 Goal Status: Active Interventions: Assess patient/caregiver ability to obtain necessary supplies Assess patient/caregiver ability to perform ulcer/skin care regimen upon admission and as needed Assess ulceration(s) every visit Notes: Electronic Signature(s) Signed: 07/29/2019 4:47:52 PM By: [name] Entered By: [name] on 07/29/2019 15:16:33 Arabie, [name] ([id number]) -------------------------------------------------------------------------------- Pain Assessment Details Patient Name: [name] Date of Service: 07/29/2019 2:45 PM Medical Record Number: [id number] Patient Account Number: [id number] Date of Birth/Sex: 08-16-1965 (54 y.o. F) Treating RN: [name] Primary Care  Clover Feehan: [place], [name] Other Clinician: Referring Quorra Rosene: [place], [name] [name] Natash Berman/Extender: STONE III, [name] Weeks in Treatment: 16 Active Problems Location of Pain Severity and Description of Pain Patient Has Paino No Site Locations Pain Management and Medication Current Pain Management: Electronic Signature(s) Signed: 07/29/2019 4:47:52 PM By: [name] Entered By: [name] on 07/29/2019 15:01:36 [place], [name] ([id number]) -------------------------------------------------------------------------------- Patient/Caregiver Education Details Patient Name: [name], [name] Date of Service: 07/29/2019 2:45 PM Medical Record Number: [id number] Patient Account Number: [id number] Date of Birth/Gender: 03-11-1966 (54 y.o. F) Treating RN: [name] Primary Care Physician: [place], [name] Other Clinician: Referring Physician: [place], [name] [name] Physician/Extender: Skeet Simmer in Treatment: 16 Education Assessment Education Provided To: Patient Education Topics Provided Wound/Skin Impairment: Handouts: Caring for Your Ulcer Methods: Demonstration, Explain/Verbal Responses: State content correctly Electronic Signature(s) Signed: 07/29/2019 4:47:52 PM By: [name] Entered By: [name] on 07/29/2019 15:20:43 Sitar, [name] ([id number]) -------------------------------------------------------------------------------- Wound Assessment Details Patient Name: [name], [name] Date of Service: 07/29/2019 2:45 PM Medical Record Number: [id number] Patient Account Number: [id number] Date of Birth/Sex: [birth year] (54 y.o. F) Treating RN: [name] Primary Care Kamryn Messineo: [place], [name]  Other Clinician: Referring Adella Manolis: [place], [name] [name] Samariah Hokenson/Extender: STONE III, [name] Weeks in Treatment: 16 Wound Status Wound Number: 5 Primary Pressure Ulcer Etiology: Wound Location: Right Sacrum Wound Open Wounding Event: Gradually  Appeared Status: Date Acquired: 10/16/2018 Comorbid Asthma, Chronic Obstructive Pulmonary Weeks Of Treatment: 16 History: Disease (COPD), History of pressure wounds, Clustered Wound: Yes Paraplegia Photos Wound Measurements Length: (cm) 4 % Reduction in Width: (cm) 8 % Reduction in Depth: (cm) 0.3 Epithelializat Clustered Quantity: 3 Tunneling: Area: (cm) 25.133 Undermining: Volume: (cm) 7.54 Area: 46.7% Volume: 77.1% ion: Small (1-33%) No No Wound Description Classification: Category/Stage III Foul Odor Afte Wound Margin: Flat and Intact Slough/Fibrino Exudate Amount: Large Exudate Type: Serous Exudate Color: amber r Cleansing: No Yes Wound Bed Granulation Amount: Medium (34-66%) Exposed Structure Granulation Quality: Red Fascia Exposed: No Necrotic Amount: Medium (34-66%) Fat Layer (Subcutaneous Tissue) Exposed: Yes Necrotic Quality: Adherent Slough Tendon Exposed: No Muscle Exposed: No Joint Exposed: No Bone Exposed: No Mungin, Shalandra M. ([id number]) Treatment Notes Wound #5 (Right Sacrum) Notes silver cell, ABD, tape Electronic Signature(s) Signed: 07/29/2019 4:47:52 PM By: [name] Entered By: [name] on 07/29/2019 15:12:37 Sporn, [name] ([id number]) -------------------------------------------------------------------------------- Wound Assessment Details Patient Name: [name] Date of Service: 07/29/2019 2:45 PM Medical Record Number: [id number] Patient Account Number: [id number] Date of Birth/Sex: Feb 19, 1966 (54 y.o. F) Treating RN: [name] Primary Care Mattilynn Forrer: [place], [name] Other Clinician: Referring Seville Brick: [place], [name] [name] Elinor Kleine/Extender: STONE III, [name] Weeks in Treatment: 16 Wound Status Wound Number: 6 Primary Etiology: Pressure Ulcer Wound Location: Right Sacrum Wound Status: Converted Wounding Event: Pressure Injury Date Acquired: 10/16/2018 Weeks Of Treatment: 16 Clustered Wound: No Wound  Measurements Length: (cm) 8 Width: (cm) 8 Depth: (cm) 0.2 Area: (cm) 50.265 Volume: (cm) 10.053 % Reduction in Area: -42497.5% % Reduction in Volume: -83675% Wound Description Classification: Category/Stage III Electronic Signature(s) Signed: 07/29/2019 4:47:52 PM By: [name] Entered By: [name] on 07/29/2019 15:11:56 Fennelly, Alba M. ([id number]) -------------------------------------------------------------------------------- Wound Assessment Details Patient Name: [name] Date of Service: 07/29/2019 2:45 PM Medical Record Number: [id number] Patient Account Number: [id number] Date of Birth/Sex: 06/16/1966 (54 y.o. F) Treating RN: [name] Primary Care Jo-Ann Johanning: [place], [name] Other Clinician: Referring Batina Dougan: [place], [name] [name] Dajia Gunnels/Extender: STONE III, [name] Weeks in Treatment: 16 Wound Status Wound Number: 7 Primary Pressure Ulcer Etiology: Wound Location: Right Trochanter Wound Open Wounding Event: Gradually Appeared Status: Date Acquired: 04/02/2018 Comorbid Asthma, Chronic Obstructive Pulmonary Weeks Of Treatment: 16 History: Disease (COPD), History of pressure wounds, Clustered Wound: No Paraplegia Photos Wound Measurements Length: (cm) 6 % Reduction in A Width: (cm) 4.5 % Reduction in V Depth: (cm) 0.6 Epithelializatio Area: (cm) 21.206 Volume: (cm) 12.723 rea: -35% olume: 10% n: Small (1-33%) Wound Description Classification: Category/Stage III Foul Odor After Wound Margin: Flat and Intact Slough/Fibrino Exudate Amount: Medium Exudate Type: Serous Exudate Color: amber Cleansing: No No Wound Bed Granulation Amount: Medium (34-66%) Exposed Structure Granulation Quality: Red Fascia Exposed: No Necrotic Amount: Medium (34-66%) Fat Layer (Subcutaneous Tissue) Exposed: Yes Necrotic Quality: Adherent Slough Tendon Exposed: No Muscle Exposed: No Joint Exposed: No Bone Exposed: No Treatment Notes MONISHA, SIEBEL.  ([id number]) Wound #7 (Right Trochanter) Notes silver cell, ABD, tape Electronic Signature(s) Signed: 07/29/2019 4:47:52 PM By: [name] Entered By: [name] on 07/29/2019 15:12:59 Buhman, [name] ([id number]) -------------------------------------------------------------------------------- Vitals Details Patient Name: [name] Date of Service: 07/29/2019 2:45 PM Medical Record Number: [id number] Patient Account Number: [id number] Date of Birth/Sex: [birth year] (54 y.o. F) Treating  RN: [name] PernaScott, Dajea Primary Care Baden Betsch: [place], [name] Other Clinician: Referring Sanaiyah Kirchhoff: [place], [name] [name] Prabhleen Montemayor/Extender: STONE III, [name] Weeks in Treatment: 16 Vital Signs Time Taken: 15:01 Temperature (F): 99.5 Height (in): 63 Pulse (bpm): 100 Weight (lbs): 70 Respiratory Rate (breaths/min): 16 Body Mass Index (BMI): 12.4 Blood Pressure (mmHg): 100/62 Reference Range: 80 - 120 mg / dl Electronic Signature(s) Signed: 07/29/2019 4:47:52 PM By: [name] PernaScott, Dajea Entered By: [name] PernaScott, Dajea on 07/29/2019 15:01:49

## 2019-07-29 NOTE — Progress Notes (Addendum)
[name] ([id number]) Visit Report for 07/29/2019 Chief Complaint Document Details Patient Name: [name], [name] Date of Service: 07/29/2019 2:45 PM Medical Record Number: [id number] Patient Account Number: [id number] Date of Birth/Sex: 1965-07-06 (55 y.o. F) Treating RN: [name] Primary Care Provider: [place], [name] Other Clinician: Referring Provider: [place], [name] Treating Provider/Extender: [name], [name] in Treatment: 16 Information Obtained from: Patient Chief Complaint Bilateral gluteal and right hip pressure ulcers Electronic Signature(s) Signed: 07/29/2019 2:52:02 PM By: [name] Kelp PA-C Entered By: [name] on 07/29/2019 14:52:01 Bankson, [name] -------------------------------------------------------------------------------- HPI Details Patient Name: [name] Date of Service: 07/29/2019 2:45 PM Medical Record Number: [id number] Patient Account Number: [id number] Date of Birth/Sex: 1966-03-06 (54 y.o. F) Treating RN: [name] Primary Care Provider: [place], [name] Other Clinician: Referring Provider: [place], [name] Treating Provider/Extender: [name], [name] [name] in Treatment: 16 History of Present Illness HPI Description: 54 year old patient who was been an unreliable historian says she had completely healed in February and most recently her home health noticed that the wound on the right scapular area had reopened. She was in hospital about a month ago with pneumonia at that time a workup was done. We have no notes today but will workup the electronic medical record. on review of her records electronically I do not find any recent hospital MRI in the last 6 months. She had a CT scan of the chest in February 2018 which does not show any osseous lesions. she was admitted to the hospital between May 14 and May 17 for 3 days for mucus plugging and collapse of the left lung. at that time she had a decubitus ulcer of the  right scapular region stage III. her history was noted to have quadriplegia C5-C7 incomplete, neurogenic bladder, nephrostomy catheter and emphysema. patient tells me that she has not smoked for last month and a half but as per her hospital records she was smoking in the middle of May. 01/12/17 patient scapular region appears to be doing fairly well on evaluation today. She is having really no syndicate discomfort and has been tolerating the dressing changes well complication. Readmission: 04/08/2019 patient presents today for reevaluation in our clinic concerning issues she is having with wounds on the bilateral gluteal area as well as on the right trochanter. She has unfortunately noted that things seem to have gotten worse as she was using the Clinitron bed. The patient states it gives off a lot of heat and subsequently has not been [name] for her skin causing her to breakdown more. She has been trying to get back just to the air mattress but has not been able to get the company to come pick up that bed and bring out the air mattress. Her primary care provider is on this as well trying to get them to do so. Unfortunately her wounds are significantly large and show signs of hyper granulation as well. She has had this kind of issue for quite some time and unfortunately just does not seem to be showing signs of significant improvement with what she has been using currently. No fevers, chills, nausea, vomiting, or diarrhea. She does attempt appropriate offloading and again has been using the bed because that is all she has although she is not happy with it. 04/22/2019 on evaluation today patient appears to be doing about the same with regard to her wounds at this time. Fortunately there does not appear to be any signs of active infection at this point which  is [name] news. She has been tolerating the dressing changes without complication. Overall I am pleased with that with that being said unfortunately  she does have a little bit of more significant breakdown with regard to the wound bed upon evaluation today. I believe this is secondary to the fact that she has been lying potentially too much in a single position for too long over the past several [name]. I think that potentially if she were to mitigate this to a degree that things may improve somewhat. With that being said she is really not happy with her current bed she is attempted to have them come out and switch this back to an air mattress but unfortunately has not been successful in getting them to come out and make this change. She is very frustrated with that. She still has the Clinitron bed and she much prefers just having the standard air mattress. Patient's primary care provider still working on this she tells me. 05/08/2019 on evaluation today patient unfortunately is not doing as well as I would like to see in regard to her wounds. She actually is measuring a little bit larger pretty much at all wound locations unfortunately and overall I am very upset as well with the fact that she has been trying to get the Clinitron bed removed from her home but is not having any luck as such. We did get the number for the company today in order for us to see about calling them and switching this over to an air mattress as this is giving her a lot of trouble she states is really hot and she feels like it is breaking down her skin. This is not [name]. 05/22/2019 on evaluation today patient appears to be doing well with regard to the wounds she is showing some signs of slight improvement at this point. This is [name] news. Fortunately there is no evidence of active infection at this time which is also [name] news. No fevers, chills, nausea, vomiting, or diarrhea. She does tell me that in order to get rid of the Clinitron bed that she has been working on that the representative from [employer] stated they needed in order for me asked to be [name] MastersWADE, [name]  M. ([id number]) getting rid of it and then what we wanted her to have. Again I will be more than happy to do that we can initiate an air mattress for her. 06/06/2019 on evaluation today patient continues to have issues with her wounds. The [name] news is we figured out what to be done for the her bad as far as get not moved however she is getting actually ready to move and change residences. For that reason what they are going to do is come in and take the bed from the current residence to get rid of the Clinitron bed and then subsequently move the new bed into her new place of residence. This makes complete sense to me as well and I think that sounds like a [name] plan. At least we have a plan for what to do as far as getting things taken care of here. 06/24/2019 on evaluation today patient appears to be doing really about the same in some regards although a couple of the wounds are measuring a little bit larger and deeper compared to previous. With that being said I do think that unfortunately her wounds seem to be overall progressing in the wrong direction towards being worse not better. The patient is still having to use  the Clinitron bed at this time I do think that she really needs to switch to just a standard air mattress that is what she wants but she is having to wait until she moves in 2 [name] before she can get the new mattress so she does not have to pay to have the bed moved twice. 07/18/2019 on evaluation today patient appears to actually be doing somewhat better in regard to her wounds in general. I am very pleased in this regard. With that being said she still has some hyper granulation unfortunately the Hydrofera Blue just was getting stuck to her and the silver cell has done better. Nonetheless I do believe that we do need to probably treat this with some silver nitrate to try to help with the hypergranulation I discussed this with the patient today. She is in agreement with this  plan. 07/29/2019 on evaluation today patient seems to be making some progress albeit slow regard to her wounds. Fortunately there is no signs of active infection at this time. No fever chills noted. She overall seems to be doing quite well at this point. 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 For about 4-5 months she's had a decubitus ulcer on the right scapular region. she was lost to follow-up for the last 6 months. she had a couple of small areas which have opened out in the region of previous scar tissue in the sacral region but these haveall healed now. She does not have any other significant comorbidities. She has a [name] Roho cushion for her wheelchair and she has a specialized mattress for sleep. She has restarted smoking and smokes about half a pack of cigarettes a day 02/18/2015 -- X-ray of the pelvis -- IMPRESSION:No acute osseous injury of the pelvis. If there is clinical concern regarding osteomyelitis, recommend further evaluation with CT. X-ray of the sacrum and coccyx IMPRESSION:No acute osseous abnormality of the sacrum. If there is further clinical concern regarding osteomyelitis of the sacrum, further evaluation with CT is recommended. ========= Electronic Signature(s) Signed: 07/29/2019 3:44:55 PM By: [name] Kelp PA-C Entered By: [name] on 07/29/2019 15:44:55 Fontanella, [name] ([id number]) -------------------------------------------------------------------------------- Physical Exam Details Patient Name: [name], [name] Date of Service: 07/29/2019 2:45 PM Medical Record Number: [id number] Patient Account Number: [id number] Date of Birth/Sex: June 10, 1966 (54 y.o. F) Treating RN: [name] Primary Care Provider: [place], [name] Other Clinician: Referring Provider: [place], [name] Treating Provider/Extender: STONE III, Dolphus Linch [name] in  Treatment: 16 Constitutional Well-nourished and well-hydrated in no acute distress. Respiratory normal breathing without difficulty. Psychiatric this patient is able to make decisions and demonstrates [name] insight into disease process. Alert and Oriented x 3. pleasant and cooperative. Notes Patient's wounds currently did show some mild hyper granulation. With that being said I was able to mechanically clean some of this hyper granular tissue away just with saline gauze which is great news. Post mechanical debridement the wounds all appear to be doing better. I am pleased with the progress especially considering how she was doing with the Clinitron bed this seems to be doing much better. The patient is extremely pleased as well not having that bed she is much happier with just having the air mattress. Electronic Signature(s) Signed: 07/29/2019 3:57:11 PM By: [name] Kelp PA-C Entered By: [name] on 07/29/2019 15:57:10 Macconnell, [name] -------------------------------------------------------------------------------- Physician Orders Details Patient Name: Geanie Logan  M. Date of Service: 07/29/2019 2:45 PM Medical Record Number: [id number] Patient Account Number: [id number] Date of Birth/Sex: [birth year] (54 y.o. F) Treating RN: Rodell Perna Primary Care Provider: [place], [name] Other Clinician: Referring Provider: [place], [name] Treating Provider/Extender: [name], Saladin Petrelli [name] in Treatment: 16 Verbal / Phone Orders: No Diagnosis Coding ICD-10 Coding Code Description L89.313 Pressure ulcer of right buttock, stage 3 L89.323 Pressure ulcer of left buttock, stage 3 L89.213 Pressure ulcer of right hip, stage 3 G82.52 Quadriplegia, C1-C4 incomplete E44.0 Moderate protein-calorie malnutrition Wound Cleansing Wound #5 Right Sacrum o Clean wound with Normal Saline. o May Shower, gently pat wound dry prior to applying new dressing. Wound #7 Right Trochanter o  Clean wound with Normal Saline. o May Shower, gently pat wound dry prior to applying new dressing. Primary Wound Dressing Wound #5 Right Sacrum o Silver Alginate - Silvercel Non Adherent specifically. Wet the dressing for removal during dressing change. Wound #7 Right Trochanter o Silver Alginate - Silvercel Non Adherent specifically. Wet the dressing for removal during dressing change. Secondary Dressing Wound #5 Right Sacrum o Boardered Foam Dressing Wound #7 Right Trochanter o Boardered Foam Dressing Dressing Change Frequency Wound #5 Right Sacrum o Change Dressing Monday, Wednesday, Friday - or three times weekly Wound #7 Right Trochanter o Change Dressing Monday, Wednesday, Friday - or three times weekly Follow-up Appointments Wound #5 Right Sacrum o Return Appointment in 1 week. SABAH, ZUCCO ([id number]) Wound #7 Right Trochanter o Return Appointment in 2 [name]. Off-Loading Wound #5 Right Sacrum o Mattress - air mattress or fluidized air mattress o Turn and reposition every 2 hours Wound #7 Right Trochanter o Mattress - air mattress or fluidized air mattress o Turn and reposition every 2 hours Additional Orders / Instructions Wound #5 Right Sacrum o Increase protein intake. o Increase protein intake. Wound #7 Right Trochanter o Increase protein intake. o Increase protein intake. Home Health Wound #5 Right Sacrum o Continue Home Health Visits - [facility] - Please provide dressing materials for patient o Home Health Nurse may visit PRN to address patientos wound care needs. o FACE TO FACE ENCOUNTER: MEDICARE and MEDICAID PATIENTS: I certify that this patient is under my care and that I had a face-to-face encounter that meets the physician face-to-face encounter requirements with this patient on this date. The encounter with the patient was in whole or in part for the following MEDICAL CONDITION: (primary reason for Home  Healthcare) MEDICAL NECESSITY: I certify, that based on my findings, NURSING services are a medically necessary home health service. HOME BOUND STATUS: I certify that my clinical findings support that this patient is homebound (i.e., Due to illness or injury, pt requires aid of supportive devices such as crutches, cane, wheelchairs, walkers, the use of special transportation or the assistance of another person to leave their place of residence. There is a normal inability to leave the home and doing so requires considerable and taxing effort. Other absences are for medical reasons / religious services and are infrequent or of short duration when for other reasons). o If current dressing causes regression in wound condition, may D/C ordered dressing product/s and apply Normal Saline Moist Dressing daily until next Wound Healing Center / Other MD appointment. Notify Wound Healing Center of regression in wound condition at [phone]. o Please direct any NON-WOUND related issues/requests for orders to patient's Primary Care Physician Wound #7 Right Trochanter o Continue Home Health Visits - [facility] - Please provide dressing materials for patient o Home Health Nurse  may visit PRN to address patientos wound care needs. o FACE TO FACE ENCOUNTER: MEDICARE and MEDICAID PATIENTS: I certify that this patient is under my care and that I had a face-to-face encounter that meets the physician face-to-face encounter requirements with this patient on this date. The encounter with the patient was in whole or in part for the following MEDICAL CONDITION: (primary reason for Home Healthcare) MEDICAL NECESSITY: I certify, that based on my findings, NURSING services are a medically necessary home health service. HOME BOUND STATUS: I certify that my clinical findings support that this patient is homebound (i.e., Due to illness or injury, pt requires aid of supportive devices such as crutches, cane,  wheelchairs, walkers, the use of special transportation or the assistance of another person to leave their place of residence. There is a normal inability to leave the home and doing so requires considerable and taxing effort. Other absences are for medical reasons / religious services and are infrequent or of short duration when for other reasons). [name] MastersWADE, [name] .. ([id number]) o If current dressing causes regression in wound condition, may D/C ordered dressing product/s and apply Normal Saline Moist Dressing daily until next Wound Healing Center / Other MD appointment. Notify Wound Healing Center of regression in wound condition at [phone]. o Please direct any NON-WOUND related issues/requests for orders to patient's Primary Care Physician Electronic Signature(s) Signed: 07/29/2019 4:47:52 PM By: Rodell PernaScott, Dajea Signed: 07/29/2019 4:55:38 PM By: [name] KelpStone III, Jaqua Ching PA-C Entered By: Rodell PernaScott, Dajea on 07/29/2019 15:28:11 Jaspers, [name] ([id number]) -------------------------------------------------------------------------------- Problem List Details Patient Name: [name] MastersWADE, [name] Date of Service: 07/29/2019 2:45 PM Medical Record Number: [id number] Patient Account Number: [id number] Date of Birth/Sex: 1965-07-15 (54 y.o. F) Treating RN: [name] CoventryWoody, Kim Primary Care Provider: [place], [name] Other Clinician: Referring Provider: [place], [name] Treating Provider/Extender: [name] DibblesSTONE III, Nimah Uphoff [name] in Treatment: 16 Active Problems ICD-10 Evaluated Encounter Code Description Active Date Today Diagnosis L89.313 Pressure ulcer of right buttock, stage 3 04/08/2019 No Yes L89.323 Pressure ulcer of left buttock, stage 3 04/08/2019 No Yes L89.213 Pressure ulcer of right hip, stage 3 04/08/2019 No Yes G82.52 Quadriplegia, C1-C4 incomplete 04/08/2019 No Yes E44.0 Moderate protein-calorie malnutrition 04/08/2019 No Yes Inactive Problems Resolved Problems Electronic Signature(s) Signed: 07/29/2019 2:50:18 PM  By: [name] KelpStone III, Hajime Asfaw PA-C Entered By: [name] KelpStone III, Tyheem Boughner on 07/29/2019 14:50:17 Sar, [name] ([id number]) -------------------------------------------------------------------------------- Progress Note Details Patient Name: [name] MastersWADE, [name] Date of Service: 07/29/2019 2:45 PM Medical Record Number: [id number] Patient Account Number: [id number] Date of Birth/Sex: 1965-07-15 (54 y.o. F) Treating RN: [name] CoventryWoody, Kim Primary Care Provider: [place], [name] Other Clinician: Referring Provider: [place], [name] Treating Provider/Extender: [name] DibblesSTONE III, Makoto Sellitto [name] in Treatment: 16 Subjective Chief Complaint Information obtained from Patient Bilateral gluteal and right hip pressure ulcers History of Present Illness (HPI) 54 year old patient who was been an unreliable historian says she had completely healed in February and most recently her home health noticed that the wound on the right scapular area had reopened. She was in hospital about a month ago with pneumonia at that time a workup was done. We have no notes today but will workup the electronic medical record. on review of her records electronically I do not find any recent hospital MRI in the last 6 months. She had a CT scan of the chest in February 2018 which does not show any osseous lesions. she was admitted to the hospital between May 14 and May 17 for 3 days for mucus plugging and collapse of the left lung. at that time  she had a decubitus ulcer of the right scapular region stage III. her history was noted to have quadriplegia C5-C7 incomplete, neurogenic bladder, nephrostomy catheter and emphysema. patient tells me that she has not smoked for last month and a half but as per her hospital records she was smoking in the middle of May. 01/12/17 patient scapular region appears to be doing fairly well on evaluation today. She is having really no syndicate discomfort and has been tolerating the dressing changes well  complication. Readmission: 04/08/2019 patient presents today for reevaluation in our clinic concerning issues she is having with wounds on the bilateral gluteal area as well as on the right trochanter. She has unfortunately noted that things seem to have gotten worse as she was using the Clinitron bed. The patient states it gives off a lot of heat and subsequently has not been [name] for her skin causing her to breakdown more. She has been trying to get back just to the air mattress but has not been able to get the company to come pick up that bed and bring out the air mattress. Her primary care provider is on this as well trying to get them to do so. Unfortunately her wounds are significantly large and show signs of hyper granulation as well. She has had this kind of issue for quite some time and unfortunately just does not seem to be showing signs of significant improvement with what she has been using currently. No fevers, chills, nausea, vomiting, or diarrhea. She does attempt appropriate offloading and again has been using the bed because that is all she has although she is not happy with it. 04/22/2019 on evaluation today patient appears to be doing about the same with regard to her wounds at this time. Fortunately there does not appear to be any signs of active infection at this point which is [name] news. She has been tolerating the dressing changes without complication. Overall I am pleased with that with that being said unfortunately she does have a little bit of more significant breakdown with regard to the wound bed upon evaluation today. I believe this is secondary to the fact that she has been lying potentially too much in a single position for too long over the past several [name]. I think that potentially if she were to mitigate this to a degree that things may improve somewhat. With that being said she is really not happy with her current bed she is attempted to have them come out and  switch this back to an air mattress but unfortunately has not been successful in getting them to come out and make this change. She is very frustrated with that. She still has the Clinitron bed and she much prefers just having the standard air mattress. Patient's primary care provider still working on this she tells me. 05/08/2019 on evaluation today patient unfortunately is not doing as well as I would like to see in regard to her wounds. She actually is measuring a little bit larger pretty much at all wound locations unfortunately and overall I am very upset as well with the fact that she has been trying to get the Clinitron bed removed from her home but is not having any luck as such. We did get the number for the company today in order for [place] to see about calling them and switching this over to an air mattress as Banning, Rhemi M. ([id number]) this is giving her a lot of trouble she states is really hot and  she feels like it is breaking down her skin. This is not [name]. 05/22/2019 on evaluation today patient appears to be doing well with regard to the wounds she is showing some signs of slight improvement at this point. This is [name] news. Fortunately there is no evidence of active infection at this time which is also [name] news. No fevers, chills, nausea, vomiting, or diarrhea. She does tell me that in order to get rid of the Clinitron bed that she has been working on that the representative from the company stated they needed in order for me asked to be getting rid of it and then what we wanted her to have. Again I will be more than happy to do that we can initiate an air mattress for her. 06/06/2019 on evaluation today patient continues to have issues with her wounds. The [name] news is we figured out what to be done for the her bad as far as get not moved however she is getting actually ready to move and change residences. For that reason what they are going to do is come in and take the bed from  the current residence to get rid of the Clinitron bed and then subsequently move the new bed into her new place of residence. This makes complete sense to me as well and I think that sounds like a [name] plan. At least we have a plan for what to do as far as getting things taken care of here. 06/24/2019 on evaluation today patient appears to be doing really about the same in some regards although a couple of the wounds are measuring a little bit larger and deeper compared to previous. With that being said I do think that unfortunately her wounds seem to be overall progressing in the wrong direction towards being worse not better. The patient is still having to use the Clinitron bed at this time I do think that she really needs to switch to just a standard air mattress that is what she wants but she is having to wait until she moves in 2 [name] before she can get the new mattress so she does not have to pay to have the bed moved twice. 07/18/2019 on evaluation today patient appears to actually be doing somewhat better in regard to her wounds in general. I am very pleased in this regard. With that being said she still has some hyper granulation unfortunately the Hydrofera Blue just was getting stuck to her and the silver cell has done better. Nonetheless I do believe that we do need to probably treat this with some silver nitrate to try to help with the hypergranulation I discussed this with the patient today. She is in agreement with this plan. 07/29/2019 on evaluation today patient seems to be making some progress albeit slow regard to her wounds. Fortunately there is no signs of active infection at this time. No fever chills noted. She overall seems to be doing quite well at this point. 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 For about 4-5 months she's had a decubitus ulcer  on the right scapular region. she was lost to follow-up for the last 6 months. she had a couple of small areas which have opened out in the region of previous scar tissue in the sacral region but these haveall healed now. She does not have any other significant comorbidities. She has a [name] Roho  cushion for her wheelchair and she has a specialized mattress for sleep. She has restarted smoking and smokes about half a pack of cigarettes a day 02/18/2015 -- X-ray of the pelvis -- IMPRESSION:No acute osseous injury of the pelvis. If there is clinical concern regarding osteomyelitis, recommend further evaluation with CT. X-ray of the sacrum and coccyx IMPRESSION:No acute osseous abnormality of the sacrum. If there is further clinical concern regarding osteomyelitis of the sacrum, further evaluation with CT is recommended. ========= Objective Constitutional Well-nourished and well-hydrated in no acute distress. Vitals Time Taken: 3:01 PM, Height: 63 in, Weight: 70 lbs, BMI: 12.4, Temperature: 99.5 F, Pulse: 100 bpm, Respiratory Tullius, [name] ([id number]) Rate: 16 breaths/min, Blood Pressure: 100/62 mmHg. Respiratory normal breathing without difficulty. Psychiatric this patient is able to make decisions and demonstrates [name] insight into disease process. Alert and Oriented x 3. pleasant and cooperative. General Notes: Patient's wounds currently did show some mild hyper granulation. With that being said I was able to mechanically clean some of this hyper granular tissue away just with saline gauze which is great news. Post mechanical debridement the wounds all appear to be doing better. I am pleased with the progress especially considering how she was doing with the Clinitron bed this seems to be doing much better. The patient is extremely pleased as well not having that bed she is much happier with just having the air mattress. Integumentary (Hair, Skin) Wound #5 status is Open. Original  cause of wound was Gradually Appeared. The wound is located on the Right Sacrum. The wound measures 4cm length x 8cm width x 0.3cm depth; 25.133cm^2 area and 7.54cm^3 volume. There is Fat Layer (Subcutaneous Tissue) Exposed exposed. There is no tunneling or undermining noted. There is a large amount of serous drainage noted. The wound margin is flat and intact. There is medium (34-66%) red granulation within the wound bed. There is a medium (34-66%) amount of necrotic tissue within the wound bed including Adherent Slough. Wound #6 status is Converted. Original cause of wound was Pressure Injury. The wound is located on the Right Sacrum. The wound measures 8cm length x 8cm width x 0.2cm depth; 50.265cm^2 area and 10.053cm^3 volume. Wound #7 status is Open. Original cause of wound was Gradually Appeared. The wound is located on the Right Trochanter. The wound measures 6cm length x 4.5cm width x 0.6cm depth; 21.206cm^2 area and 12.723cm^3 volume. There is Fat Layer (Subcutaneous Tissue) Exposed exposed. There is a medium amount of serous drainage noted. The wound margin is flat and intact. There is medium (34-66%) red granulation within the wound bed. There is a medium (34-66%) amount of necrotic tissue within the wound bed including Adherent Slough. 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 May Shower, gently pat wound dry prior to applying new dressing. [name] MastersWADE, Kiahna .. ([id number]) Wound #7 Right Trochanter: Clean wound with Normal Saline. May Shower, gently pat wound dry prior to applying new dressing. Primary Wound Dressing: Wound #5 Right Sacrum: Silver Alginate - Silvercel Non Adherent specifically. Wet the dressing for removal  during dressing change. Wound #7 Right Trochanter: Silver Alginate - Silvercel Non Adherent specifically. Wet the dressing for removal during dressing change. Secondary Dressing: Wound #5 Right Sacrum: Boardered Foam Dressing Wound #7 Right Trochanter: Boardered Foam Dressing Dressing Change Frequency:  Wound #5 Right Sacrum: Change Dressing Monday, Wednesday, Friday - or three times weekly Wound #7 Right Trochanter: Change Dressing Monday, Wednesday, Friday - or three times weekly Follow-up Appointments: Wound #5 Right Sacrum: Return Appointment in 1 week. Wound #7 Right Trochanter: Return Appointment in 2 [name]. Off-Loading: Wound #5 Right Sacrum: Mattress - air mattress or fluidized air mattress Turn and reposition every 2 hours Wound #7 Right Trochanter: Mattress - air mattress or fluidized air mattress Turn and reposition every 2 hours Additional Orders / Instructions: Wound #5 Right Sacrum: Increase protein intake. Increase protein intake. Wound #7 Right Trochanter: Increase protein intake. Increase protein intake. Home Health: Wound #5 Right Sacrum: Continue Home Health Visits - [facility] - Please provide dressing materials for patient Home Health Nurse may visit PRN to address patient s wound care needs. FACE TO FACE ENCOUNTER: MEDICARE and MEDICAID PATIENTS: I certify that this patient is under my care and that I had a face-to-face encounter that meets the physician face-to-face encounter requirements with this patient on this date. The encounter with the patient was in whole or in part for the following MEDICAL CONDITION: (primary reason for Home Healthcare) MEDICAL NECESSITY: I certify, that based on my findings, NURSING services are a medically necessary home health service. HOME BOUND STATUS: I certify that my clinical findings support that this patient is homebound (i.e., Due to illness or injury, pt requires aid of supportive devices such as crutches, cane,  wheelchairs, walkers, the use of special transportation or the assistance of another person to leave their place of residence. There is a normal inability to leave the home and doing so requires considerable and taxing effort. Other absences are for medical reasons / religious services and are infrequent or of short duration when for other reasons). If current dressing causes regression in wound condition, may D/C ordered dressing product/s and apply Normal Saline Moist Dressing daily until next Wound Healing Center / Other MD appointment. Notify Wound Healing Center of regression in wound condition at [phone]. Please direct any NON-WOUND related issues/requests for orders to patient's Primary Care Physician Wound #7 Right Trochanter: Continue Home Health Visits - [facility] - Please provide dressing materials for patient Home Health Nurse may visit PRN to address patient s wound care needs. FACE TO FACE ENCOUNTER: MEDICARE and MEDICAID PATIENTS: I certify that this patient is under my care and that I had a face-to-face encounter that meets the physician face-to-face encounter requirements with this patient on this date. The encounter with the patient was in whole or in part for the following MEDICAL CONDITION: (primary reason for Home [name], WARNICK. ([id number]) Healthcare) MEDICAL NECESSITY: I certify, that based on my findings, NURSING services are a medically necessary home health service. HOME BOUND STATUS: I certify that my clinical findings support that this patient is homebound (i.e., Due to illness or injury, pt requires aid of supportive devices such as crutches, cane, wheelchairs, walkers, the use of special transportation or the assistance of another person to leave their place of residence. There is a normal inability to leave the home and doing so requires considerable and taxing effort. Other absences are for medical reasons / religious services and are infrequent or of short  duration when for other reasons). If current dressing causes regression in wound condition, may D/C ordered dressing product/s and apply Normal Saline Moist Dressing daily until next Wound Healing Center / Other MD appointment. Notify Wound Healing Center of regression in wound condition at [phone]. Please direct any NON-WOUND  related issues/requests for orders to patient's Primary Care Physician 1. My suggestion at this time is going to be that we go ahead and initiate treatment with continuation of the silver alginate dressing that seems to be doing well for her I think we will continue as such. There is no need to change in light of it doing so well. 2. I recommend continued and aggressive offloading. The patient states that she is definitely trying to do that as well. 3. I am also going to suggest that she continue to try to increase her protein intake I think that is of utmost importance especially given her size and obvious somewhat malnourished state. We will see patient back for reevaluation in 1 week here in the clinic. If anything worsens or changes patient will contact our office for additional recommendations. Electronic Signature(s) Signed: 07/29/2019 3:58:34 PM By: Worthy Keeler PA-C Entered By: Worthy Keeler on 07/29/2019 15:58:33 [name], [name] ([id number]) -------------------------------------------------------------------------------- SuperBill Details Patient Name: [name] Date of Service: 07/29/2019 Medical Record Number: [id number] Patient Account Number: [id number] Date of Birth/Sex: 1966-05-11 (55 y.o. F) Treating RN: Cornell Barman Primary Care Provider: [place], [name] Other Clinician: Referring Provider: [place], [name] Treating Provider/Extender: Melburn Hake, Chaunte Hornbeck [name] in Treatment: 16 Diagnosis Coding ICD-10 Codes Code Description L89.313 Pressure ulcer of right buttock, stage 3 L89.323 Pressure ulcer of left buttock, stage 3 L89.213 Pressure  ulcer of right hip, stage 3 G82.52 Quadriplegia, C1-C4 incomplete E44.0 Moderate protein-calorie malnutrition Facility Procedures CPT4 Code: [id number] Description: 99213 - WOUND CARE VISIT-LEV 3 EST PT Modifier: Quantity: 1 Physician Procedures CPT4 Code: [id number] Description: 16384 - WC PHYS LEVEL 3 - EST PT ICD-10 Diagnosis Description L89.313 Pressure ulcer of right buttock, stage 3 L89.323 Pressure ulcer of left buttock, stage 3 L89.213 Pressure ulcer of right hip, stage 3 G82.52 Quadriplegia, C1-C4 incomplete Modifier: Quantity: 1 Electronic Signature(s) Signed: 07/29/2019 3:59:07 PM By: Worthy Keeler PA-C Entered By: Worthy Keeler on 07/29/2019 15:59:07

## 2019-08-12 ENCOUNTER — Other Ambulatory Visit: Payer: Self-pay

## 2019-08-12 ENCOUNTER — Encounter: Payer: Medicare Other | Attending: Physician Assistant | Admitting: Physician Assistant

## 2019-08-12 DIAGNOSIS — L89213 Pressure ulcer of right hip, stage 3: Secondary | ICD-10-CM | POA: Insufficient documentation

## 2019-08-12 DIAGNOSIS — J449 Chronic obstructive pulmonary disease, unspecified: Secondary | ICD-10-CM | POA: Diagnosis not present

## 2019-08-12 DIAGNOSIS — N319 Neuromuscular dysfunction of bladder, unspecified: Secondary | ICD-10-CM | POA: Insufficient documentation

## 2019-08-12 DIAGNOSIS — G8252 Quadriplegia, C1-C4 incomplete: Secondary | ICD-10-CM | POA: Diagnosis not present

## 2019-08-12 DIAGNOSIS — E440 Moderate protein-calorie malnutrition: Secondary | ICD-10-CM | POA: Diagnosis not present

## 2019-08-12 DIAGNOSIS — F17210 Nicotine dependence, cigarettes, uncomplicated: Secondary | ICD-10-CM | POA: Diagnosis not present

## 2019-08-12 DIAGNOSIS — L89323 Pressure ulcer of left buttock, stage 3: Secondary | ICD-10-CM | POA: Diagnosis not present

## 2019-08-12 DIAGNOSIS — L89313 Pressure ulcer of right buttock, stage 3: Secondary | ICD-10-CM | POA: Insufficient documentation

## 2019-08-12 NOTE — Progress Notes (Addendum)
SHARIN, ALTIDOR ([id number]) Visit Report for 08/12/2019 Chief Complaint Document Details Patient Name: [name], [name] Date of Service: 08/12/2019 2:45 PM Medical Record Number: [id number] Patient Account Number: [id number] Date of Birth/Sex: 05/26/1966 (54 y.o. F) Treating RN: Curtis Sites Primary Care Provider: [place], JULIE Other Clinician: Referring Provider: [place], JULIE Treating Provider/Extender: STONE III, Annaleigha Woo Weeks in Treatment: 18 Information Obtained from: Patient Chief Complaint Bilateral gluteal and right hip pressure ulcers Electronic Signature(s) Signed: 08/12/2019 3:13:47 PM By: Lenda Kelp PA-C Entered By: Lenda Kelp on 08/12/2019 15:13:47 Eyerman, [name] ([id number]) -------------------------------------------------------------------------------- HPI Details Patient Name: [name], [name] Date of Service: 08/12/2019 2:45 PM Medical Record Number: [id number] Patient Account Number: [id number] Date of Birth/Sex: Apr 28, 1966 (54 y.o. F) Treating RN: Curtis Sites Primary Care Provider: [place], JULIE Other Clinician: Referring Provider: [place], JULIE Treating Provider/Extender: Linwood Dibbles, Eltha Tingley Weeks in Treatment: 18 History of Present Illness HPI Description: 54 year old patient who was been an unreliable historian says she had completely healed in February and most recently her home health noticed that the wound on the right scapular area had reopened. She was in hospital about a month ago with pneumonia at that time a workup was done. We have no notes today but will workup the electronic medical record. on review of her records electronically I do not find any recent hospital MRI in the last 6 months. She had a CT scan of the chest in February 2018 which does not show any osseous lesions. she was admitted to the hospital between May 14 and May 17 for 3 days for mucus plugging and collapse of the left lung. at that time she had a decubitus ulcer of the  right scapular region stage III. her history was noted to have quadriplegia C5-C7 incomplete, neurogenic bladder, nephrostomy catheter and emphysema. patient tells me that she has not smoked for last month and a half but as per her hospital records she was smoking in the middle of May. 01/12/17 patient scapular region appears to be doing fairly well on evaluation today. She is having really no syndicate discomfort and has been tolerating the dressing changes well complication. Readmission: 04/08/2019 patient presents today for reevaluation in our clinic concerning issues she is having with wounds on the bilateral gluteal area as well as on the right trochanter. She has unfortunately noted that things seem to have gotten worse as she was using the Clinitron bed. The patient states it gives off a lot of heat and subsequently has not been good for her skin causing her to breakdown more. She has been trying to get back just to the air mattress but has not been able to get the company to come pick up that bed and bring out the air mattress. Her primary care provider is on this as well trying to get them to do so. Unfortunately her wounds are significantly large and show signs of hyper granulation as well. She has had this kind of issue for quite some time and unfortunately just does not seem to be showing signs of significant improvement with what she has been using currently. No fevers, chills, nausea, vomiting, or diarrhea. She does attempt appropriate offloading and again has been using the bed because that is all she has although she is not happy with it. 04/22/2019 on evaluation today patient appears to be doing about the same with regard to her wounds at this time. Fortunately there does not appear to be any signs of active infection at this point which  is good news. She has been tolerating the dressing changes without complication. Overall I am pleased with that with that being said unfortunately  she does have a little bit of more significant breakdown with regard to the wound bed upon evaluation today. I believe this is secondary to the fact that she has been lying potentially too much in a single position for too long over the past several weeks. I think that potentially if she were to mitigate this to a degree that things may improve somewhat. With that being said she is really not happy with her current bed she is attempted to have them come out and switch this back to an air mattress but unfortunately has not been successful in getting them to come out and make this change. She is very frustrated with that. She still has the Clinitron bed and she much prefers just having the standard air mattress. Patient's primary care provider still working on this she tells me. 05/08/2019 on evaluation today patient unfortunately is not doing as well as I would like to see in regard to her wounds. She actually is measuring a little bit larger pretty much at all wound locations unfortunately and overall I am very upset as well with the fact that she has been trying to get the Clinitron bed removed from her home but is not having any luck as such. We did get the number for the company today in order for us to see about calling them and switching this over to an air mattress as this is giving her a lot of trouble she states is really hot and she feels like it is breaking down her skin. This is not good. 05/22/2019 on evaluation today patient appears to be doing well with regard to the wounds she is showing some signs of slight improvement at this point. This is good news. Fortunately there is no evidence of active infection at this time which is also good news. No fevers, chills, nausea, vomiting, or diarrhea. She does tell me that in order to get rid of the Clinitron bed that she has been working on that the representative from [employer] stated they needed in order for me asked to be [name], Bayli  M. ([id number]) getting rid of it and then what we wanted her to have. Again I will be more than happy to do that we can initiate an air mattress for her. 06/06/2019 on evaluation today patient continues to have issues with her wounds. The good news is we figured out what to be done for the her bad as far as get not moved however she is getting actually ready to move and change residences. For that reason what they are going to do is come in and take the bed from the current residence to get rid of the Clinitron bed and then subsequently move the new bed into her new place of residence. This makes complete sense to me as well and I think that sounds like a good plan. At least we have a plan for what to do as far as getting things taken care of here. 06/24/2019 on evaluation today patient appears to be doing really about the same in some regards although a couple of the wounds are measuring a little bit larger and deeper compared to previous. With that being said I do think that unfortunately her wounds seem to be overall progressing in the wrong direction towards being worse not better. The patient is still having to use  the Clinitron bed at this time I do think that she really needs to switch to just a standard air mattress that is what she wants but she is having to wait until she moves in 2 weeks before she can get the new mattress so she does not have to pay to have the bed moved twice. 07/18/2019 on evaluation today patient appears to actually be doing somewhat better in regard to her wounds in general. I am very pleased in this regard. With that being said she still has some hyper granulation unfortunately the Hydrofera Blue just was getting stuck to her and the silver cell has done better. Nonetheless I do believe that we do need to probably treat this with some silver nitrate to try to help with the hypergranulation I discussed this with the patient today. She is in agreement with this  plan. 07/29/2019 on evaluation today patient seems to be making some progress albeit slow regard to her wounds. Fortunately there is no signs of active infection at this time. No fever chills noted. She overall seems to be doing quite well at this point. 08/12/2019 upon evaluation today patient appears to be doing about the same at this point in regard to her wounds. She still has some hyper granulation. I really feel like she would do better with Hydrofera Blue over the alginate itself. With that being said we have not been able to use that for a while as it was sticking but again it has been sometime she was having issues with a different bed at that time as well which may have contributed to some of the issues at that point. Fortunately there is no evidence of active infection at this time. No fevers, chills, nausea, vomiting, or diarrhea. ======= Old notes 54 year old patient was had quadriparesis for the last 9 years and has had previous extensive surgery and reconstruction at [facility]. For about 4-5 months she's had a decubitus ulcer on the right scapular region. she was lost to follow-up for the last 6 months. she had a couple of small areas which have opened out in the region of previous scar tissue in the sacral region but these haveall healed now. She does not have any other significant comorbidities. She has a good Roho cushion for her wheelchair and she has a specialized mattress for sleep. She has restarted smoking and smokes about half a pack of cigarettes a day 02/18/2015 -- X-ray of the pelvis -- IMPRESSION:No acute osseous injury of the pelvis. If there is clinical concern regarding osteomyelitis, recommend further evaluation with CT. X-ray of the sacrum and coccyx IMPRESSION:No acute osseous abnormality of the sacrum. If there is further clinical concern regarding osteomyelitis of the sacrum, further evaluation with CT is recommended. ========= Electronic  Signature(s) Signed: 08/12/2019 3:30:11 PM By: Lenda Kelp PA-C Entered By: Lenda Kelp on 08/12/2019 15:30:10 Kosak, [name] ([id number]) -------------------------------------------------------------------------------- Physical Exam Details Patient Name: [name], [name] Date of Service: 08/12/2019 2:45 PM Medical Record Number: [id number] Patient Account Number: [id number] Date of Birth/Sex: 07-30-1965 (54 y.o. F) Treating RN: Curtis Sites Primary Care Provider: [place], JULIE Other Clinician: Referring Provider: [place], JULIE Treating Provider/Extender: STONE III, Kevaughn Ewing Weeks in Treatment: 18 Constitutional Well-nourished and well-hydrated in no acute distress. Respiratory normal breathing without difficulty. Psychiatric this patient is able to make decisions and demonstrates good insight into disease process. Alert and Oriented x 3. pleasant and cooperative. Notes Wound bed currently showed signs of good granulation in some areas  hyper granulation and others and overall I feel like there is more hyper granulation which is preventing this from healing as appropriately. I think that we may want to consider a switch to [facility] to see how things do. Electronic Signature(s) Signed: 08/12/2019 3:30:32 PM By: Lenda Kelp PA-C Entered By: Lenda Kelp on 08/12/2019 15:30:31 Tapanes, [name] ([id number]) -------------------------------------------------------------------------------- Physician Orders Details Patient Name: [name], [name] Date of Service: 08/12/2019 2:45 PM Medical Record Number: [id number] Patient Account Number: [id number] Date of Birth/Sex: July 11, 1965 (54 y.o. F) Treating RN: Curtis Sites Primary Care Provider: [place], JULIE Other Clinician: Referring Provider: [place], JULIE Treating Provider/Extender: Linwood Dibbles, Jamilex Bohnsack Weeks in Treatment: 69 Verbal / Phone Orders: No Diagnosis Coding ICD-10 Coding Code Description L89.313 Pressure ulcer  of right buttock, stage 3 L89.323 Pressure ulcer of left buttock, stage 3 L89.213 Pressure ulcer of right hip, stage 3 G82.52 Quadriplegia, C1-C4 incomplete E44.0 Moderate protein-calorie malnutrition Wound Cleansing Wound #5 Right Sacrum o Clean wound with Normal Saline. o May Shower, gently pat wound dry prior to applying new dressing. Wound #7 Right Trochanter o Clean wound with Normal Saline. o May Shower, gently pat wound dry prior to applying new dressing. Primary Wound Dressing Wound #5 Right Sacrum o Hydrafera Blue Ready Transfer - Please order Hydrofera Blue Ready Transfer specifically for patient r/t wound location and wet with saline or warm soapy water prior to removal Wound #7 Right Trochanter o Hydrafera Blue Ready Transfer - Please order Hydrofera Blue Ready Transfer specifically for patient r/t wound location and wet with saline or warm soapy water prior to removal Secondary Dressing Wound #5 Right Sacrum o Boardered Foam Dressing Wound #7 Right Trochanter o Boardered Foam Dressing Dressing Change Frequency Wound #5 Right Sacrum o Change Dressing Monday, Wednesday, Friday - or three times weekly Wound #7 Right Trochanter o Change Dressing Monday, Wednesday, Friday - or three times weekly Follow-up Appointments o Return Appointment in 1 week. KAELEE, PFEFFER. ([id number]) Off-Loading Wound #5 Right Sacrum o Mattress - air mattress or fluidized air mattress o Turn and reposition every 2 hours Wound #7 Right Trochanter o Mattress - air mattress or fluidized air mattress o Turn and reposition every 2 hours Additional Orders / Instructions Wound #5 Right Sacrum o Increase protein intake. o Increase protein intake. Wound #7 Right Trochanter o Increase protein intake. o Increase protein intake. Home Health Wound #5 Right Sacrum o Continue Home Health Visits - [facility] - Please provide dressing materials for patient o  Home Health Nurse may visit PRN to address patientos wound care needs. o FACE TO FACE ENCOUNTER: MEDICARE and MEDICAID PATIENTS: I certify that this patient is under my care and that I had a face-to-face encounter that meets the physician face-to-face encounter requirements with this patient on this date. The encounter with the patient was in whole or in part for the following MEDICAL CONDITION: (primary reason for Home Healthcare) MEDICAL NECESSITY: I certify, that based on my findings, NURSING services are a medically necessary home health service. HOME BOUND STATUS: I certify that my clinical findings support that this patient is homebound (i.e., Due to illness or injury, pt requires aid of supportive devices such as crutches, cane, wheelchairs, walkers, the use of special transportation or the assistance of another person to leave their place of residence. There is a normal inability to leave the home and doing so requires considerable and taxing effort. Other absences are for medical reasons / religious services and are infrequent or of  short duration when for other reasons). o If current dressing causes regression in wound condition, may D/C ordered dressing product/s and apply Normal Saline Moist Dressing daily until next Wound Healing Center / Other MD appointment. Notify Wound Healing Center of regression in wound condition at [phone]. o Please direct any NON-WOUND related issues/requests for orders to patient's Primary Care Physician Wound #7 Right Trochanter o Continue Home Health Visits - [facility] - Please provide dressing materials for patient o Home Health Nurse may visit PRN to address patientos wound care needs. o FACE TO FACE ENCOUNTER: MEDICARE and MEDICAID PATIENTS: I certify that this patient is under my care and that I had a face-to-face encounter that meets the physician face-to-face encounter requirements with this patient on this date. The encounter with  the patient was in whole or in part for the following MEDICAL CONDITION: (primary reason for Home Healthcare) MEDICAL NECESSITY: I certify, that based on my findings, NURSING services are a medically necessary home health service. HOME BOUND STATUS: I certify that my clinical findings support that this patient is homebound (i.e., Due to illness or injury, pt requires aid of supportive devices such as crutches, cane, wheelchairs, walkers, the use of special transportation or the assistance of another person to leave their place of residence. There is a normal inability to leave the home and doing so requires considerable and taxing effort. Other absences are for medical reasons / religious services and are infrequent or of short duration when for other reasons). o If current dressing causes regression in wound condition, may D/C ordered dressing product/s and apply Normal Saline Moist Dressing daily until next Wound Healing Center / Other MD appointment. Notify Wound Healing Center of regression in wound condition at [phone]. o Please direct any NON-WOUND related issues/requests for orders to patient's Primary Care Physician VONCILLE, SIMM ([id number]) Electronic Signature(s) Signed: 08/12/2019 4:04:09 PM By: Lenda Kelp PA-C Signed: 08/12/2019 4:13:47 PM By: Curtis Sites Entered By: Curtis Sites on 08/12/2019 15:22:00 Glaze, [name] ([id number]) -------------------------------------------------------------------------------- Problem List Details Patient Name: [name], [name] Date of Service: 08/12/2019 2:45 PM Medical Record Number: [id number] Patient Account Number: [id number] Date of Birth/Sex: 09-Aug-1965 (55 y.o. F) Treating RN: Curtis Sites Primary Care Provider: [place], JULIE Other Clinician: Referring Provider: [place], JULIE Treating Provider/Extender: Linwood Dibbles, [place] Harral Weeks in Treatment: 18 Active Problems ICD-10 Evaluated Encounter Code Description Active  Date Today Diagnosis L89.313 Pressure ulcer of right buttock, stage 3 04/08/2019 No Yes L89.323 Pressure ulcer of left buttock, stage 3 04/08/2019 No Yes L89.213 Pressure ulcer of right hip, stage 3 04/08/2019 No Yes G82.52 Quadriplegia, C1-C4 incomplete 04/08/2019 No Yes E44.0 Moderate protein-calorie malnutrition 04/08/2019 No Yes Inactive Problems Resolved Problems Electronic Signature(s) Signed: 08/12/2019 3:13:41 PM By: Lenda Kelp PA-C Entered By: Lenda Kelp on 08/12/2019 15:13:41 Mcevoy, [name] ([id number]) -------------------------------------------------------------------------------- Progress Note Details Patient Name: [name] Date of Service: 08/12/2019 2:45 PM Medical Record Number: [id number] Patient Account Number: [id number] Date of Birth/Sex: [birth year] (54 y.o. F) Treating RN: Curtis Sites Primary Care Provider: [place], JULIE Other Clinician: Referring Provider: [place], JULIE Treating Provider/Extender: Linwood Dibbles, Delford Wingert Weeks in Treatment: 18 Subjective Chief Complaint Information obtained from Patient Bilateral gluteal and right hip pressure ulcers History of Present Illness (HPI) 54 year old patient who was been an unreliable historian says she had completely healed in February and most recently her home health noticed that the wound on the right scapular area had reopened. She was in hospital about a month ago with pneumonia  at that time a workup was done. We have no notes today but will workup the electronic medical record. on review of her records electronically I do not find any recent hospital MRI in the last 6 months. She had a CT scan of the chest in February 2018 which does not show any osseous lesions. she was admitted to the hospital between May 14 and May 17 for 3 days for mucus plugging and collapse of the left lung. at that time she had a decubitus ulcer of the right scapular region stage III. her history was noted to have quadriplegia  C5-C7 incomplete, neurogenic bladder, nephrostomy catheter and emphysema. patient tells me that she has not smoked for last month and a half but as per her hospital records she was smoking in the middle of May. 01/12/17 patient scapular region appears to be doing fairly well on evaluation today. She is having really no syndicate discomfort and has been tolerating the dressing changes well complication. Readmission: 04/08/2019 patient presents today for reevaluation in our clinic concerning issues she is having with wounds on the bilateral gluteal area as well as on the right trochanter. She has unfortunately noted that things seem to have gotten worse as she was using the Clinitron bed. The patient states it gives off a lot of heat and subsequently has not been good for her skin causing her to breakdown more. She has been trying to get back just to the air mattress but has not been able to get the company to come pick up that bed and bring out the air mattress. Her primary care provider is on this as well trying to get them to do so. Unfortunately her wounds are significantly large and show signs of hyper granulation as well. She has had this kind of issue for quite some time and unfortunately just does not seem to be showing signs of significant improvement with what she has been using currently. No fevers, chills, nausea, vomiting, or diarrhea. She does attempt appropriate offloading and again has been using the bed because that is all she has although she is not happy with it. 04/22/2019 on evaluation today patient appears to be doing about the same with regard to her wounds at this time. Fortunately there does not appear to be any signs of active infection at this point which is good news. She has been tolerating the dressing changes without complication. Overall I am pleased with that with that being said unfortunately she does have a little bit of more significant breakdown with regard to the  wound bed upon evaluation today. I believe this is secondary to the fact that she has been lying potentially too much in a single position for too long over the past several weeks. I think that potentially if she were to mitigate this to a degree that things may improve somewhat. With that being said she is really not happy with her current bed she is attempted to have them come out and switch this back to an air mattress but unfortunately has not been successful in getting them to come out and make this change. She is very frustrated with that. She still has the Clinitron bed and she much prefers just having the standard air mattress. Patient's primary care provider still working on this she tells me. 05/08/2019 on evaluation today patient unfortunately is not doing as well as I would like to see in regard to her wounds. She actually is measuring a little bit larger pretty much  at all wound locations unfortunately and overall I am very upset as well with the fact that she has been trying to get the Clinitron bed removed from her home but is not having any luck as such. We did get the number for the company today in order for [place] to see about calling them and switching this over to an air mattress as Bujak, Jack M. ([phone]) this is giving her a lot of trouble she states is really hot and she feels like it is breaking down her skin. This is not good. 05/22/2019 on evaluation today patient appears to be doing well with regard to the wounds she is showing some signs of slight improvement at this point. This is good news. Fortunately there is no evidence of active infection at this time which is also good news. No fevers, chills, nausea, vomiting, or diarrhea. She does tell me that in order to get rid of the Clinitron bed that she has been working on that the representative from the company stated they needed in order for me asked to be getting rid of it and then what we wanted her to have. Again I  will be more than happy to do that we can initiate an air mattress for her. 06/06/2019 on evaluation today patient continues to have issues with her wounds. The good news is we figured out what to be done for the her bad as far as get not moved however she is getting actually ready to move and change residences. For that reason what they are going to do is come in and take the bed from the current residence to get rid of the Clinitron bed and then subsequently move the new bed into her new place of residence. This makes complete sense to me as well and I think that sounds like a good plan. At least we have a plan for what to do as far as getting things taken care of here. 06/24/2019 on evaluation today patient appears to be doing really about the same in some regards although a couple of the wounds are measuring a little bit larger and deeper compared to previous. With that being said I do think that unfortunately her wounds seem to be overall progressing in the wrong direction towards being worse not better. The patient is still having to use the [place] bed at this time I do think that she really needs to switch to just a standard air mattress that is what she wants but she is having to wait until she moves in 2 weeks before she can get the new mattress so she does not have to pay to have the bed moved twice. 07/18/2019 on evaluation today patient appears to actually be doing somewhat better in regard to her wounds in general. I am very pleased in this regard. With that being said she still has some hyper granulation unfortunately the Hydrofera Blue just was getting stuck to her and the silver cell has done better. Nonetheless I do believe that we do need to probably treat this with some silver nitrate to try to help with the hypergranulation I discussed this with the patient today. She is in agreement with this plan. 07/29/2019 on evaluation today patient seems to be making some progress albeit  slow regard to her wounds. Fortunately there is no signs of active infection at this time. No fever chills noted. She overall seems to be doing quite well at this point. 08/12/2019 upon evaluation today patient appears to  be doing about the same at this point in regard to her wounds. She still has some hyper granulation. I really feel like she would do better with Hydrofera Blue over the alginate itself. With that being said we have not been able to use that for a while as it was sticking but again it has been sometime she was having issues with a different bed at that time as well which may have contributed to some of the issues at that point. Fortunately there is no evidence of active infection at this time. No fevers, chills, nausea, vomiting, or diarrhea. 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 For about 4-5 months she's had a decubitus ulcer on the right scapular region. she was lost to follow-up for the last 6 months. she had a couple of small areas which have opened out in the region of previous scar tissue in the sacral region but these haveall healed now. She does not have any other significant comorbidities. She has a good Roho cushion for her wheelchair and she has a specialized mattress for sleep. She has restarted smoking and smokes about half a pack of cigarettes a day 02/18/2015 -- X-ray of the pelvis -- IMPRESSION:No acute osseous injury of the pelvis. If there is clinical concern regarding osteomyelitis, recommend further evaluation with CT. X-ray of the sacrum and coccyx IMPRESSION:No acute osseous abnormality of the sacrum. If there is further clinical concern regarding osteomyelitis of the sacrum, further evaluation with CT is recommended. ========= Corsetti, Shallyn M. ([id number]) Objective Constitutional Well-nourished and well-hydrated in no acute  distress. Vitals Time Taken: 2:57 PM, Height: 63 in, Weight: 70 lbs, BMI: 12.4, Temperature: 99.1 F, Pulse: 107 bpm, Respiratory Rate: 16 breaths/min, Blood Pressure: 84/52 mmHg. Respiratory normal breathing without difficulty. Psychiatric this patient is able to make decisions and demonstrates good insight into disease process. Alert and Oriented x 3. pleasant and cooperative. General Notes: Wound bed currently showed signs of good granulation in some areas hyper granulation and others and overall I feel like there is more hyper granulation which is preventing this from healing as appropriately. I think that we may want to consider a [facility] to see how things do. Integumentary (Hair, Skin) Wound #5 status is Open. Original cause of wound was Gradually Appeared. The wound is located on the Right Sacrum. The wound measures 7.5cm length x 10.6cm width x 1cm depth; 62.439cm^2 area and 62.439cm^3 volume. There is Fat Layer (Subcutaneous Tissue) Exposed exposed. There is no tunneling or undermining noted. There is a large amount of serous drainage noted. The wound margin is flat and intact. There is medium (34-66%) red granulation within the wound bed. There is a medium (34-66%) amount of necrotic tissue within the wound bed including Adherent Slough. Wound #7 status is Open. Original cause of wound was Gradually Appeared. The wound is located on the Right Trochanter. The wound measures 6cm length x 3.9cm width x 0.8cm depth; 18.378cm^2 area and 14.703cm^3 volume. There is Fat Layer (Subcutaneous Tissue) Exposed exposed. There is a medium amount of serous drainage noted. The wound margin is flat and intact. There is medium (34-66%) red granulation within the wound bed. There is a medium (34-66%) amount of necrotic tissue within the wound bed including Adherent Slough. 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 ([id number]) Clean wound with Normal Saline. May Shower, gently pat wound dry prior to applying new dressing. Wound #7 Right Trochanter: Clean wound with Normal Saline. May Shower, gently pat wound dry prior to applying new dressing. Primary Wound Dressing: Wound #5 Right Sacrum: Hydrafera Blue Ready Transfer - Please order Hydrofera Blue Ready Transfer specifically for patient r/t wound location and wet with saline or warm soapy water prior to removal Wound #7 Right Trochanter: Hydrafera Blue Ready Transfer - Please order Hydrofera Blue Ready Transfer specifically for patient r/t wound location and wet with saline or warm soapy water prior to removal Secondary Dressing: Wound #5 Right Sacrum: Boardered Foam Dressing Wound #7 Right Trochanter: Boardered Foam Dressing Dressing Change Frequency: Wound #5 Right Sacrum: Change Dressing Monday, Wednesday, Friday - or three times weekly Wound #7 Right Trochanter: Change Dressing Monday, Wednesday, Friday - or three times weekly Follow-up Appointments: Return Appointment in 1 week. Off-Loading: Wound #5 Right Sacrum: Mattress - air mattress or fluidized air mattress Turn and reposition every 2 hours Wound #7 Right Trochanter: Mattress - air mattress or fluidized air mattress Turn and reposition every 2 hours Additional Orders / Instructions: Wound #5 Right Sacrum: Increase protein intake. Increase protein intake. Wound #7 Right Trochanter: Increase protein intake. Increase protein intake. Home Health: Wound #5 Right Sacrum: Continue Home Health Visits - [facility] - Please provide dressing materials for patient Home Health Nurse may visit PRN to address patient s wound care needs. FACE TO FACE ENCOUNTER: MEDICARE and MEDICAID PATIENTS: I certify that  this patient is under my care and that I had a face-to-face encounter that meets the physician face-to-face encounter requirements with this patient on this date. The encounter with the patient was in whole or in part for the following MEDICAL CONDITION: (primary reason for Home Healthcare) MEDICAL NECESSITY: I certify, that based on my findings, NURSING services are a medically necessary home health service. HOME BOUND STATUS: I certify that my clinical findings support that this patient is homebound (i.e., Due to illness or injury, pt requires aid of supportive devices such as crutches, cane, wheelchairs, walkers, the use of special transportation or the assistance of another person to leave their place of residence. There is a normal inability to leave the home and doing so requires considerable and taxing effort. Other absences are for medical reasons / religious services and are infrequent or of short duration when for other reasons). If current dressing causes regression in wound condition, may D/C ordered dressing product/s and apply Normal Saline Moist Dressing daily until next Wound Healing Center / Other MD appointment. Notify Wound Healing Center of regression in wound condition at [phone]. Please direct any NON-WOUND related issues/requests for orders to patient's Primary Care Physician Wound #7 Right Trochanter: Continue Home Health Visits - [facility] - Please provide dressing materials for patient Home Health Nurse may visit PRN to address patient s wound care needs. FACE TO FACE ENCOUNTER: MEDICARE and MEDICAID PATIENTS: I certify that this patient is under my care and that I had a face-to-face encounter that meets the physician face-to-face encounter requirements with this patient on this date. The CLEVA, CAMERO. ([id number]) encounter with the patient was in whole or in part for the following MEDICAL CONDITION: (primary reason for Home Healthcare) MEDICAL NECESSITY: I  certify, that based on my findings, NURSING services are a medically necessary home health service.  HOME BOUND STATUS: I certify that my clinical findings support that this patient is homebound (i.e., Due to illness or injury, pt requires aid of supportive devices such as crutches, cane, wheelchairs, walkers, the use of special transportation or the assistance of another person to leave their place of residence. There is a normal inability to leave the home and doing so requires considerable and taxing effort. Other absences are for medical reasons / religious services and are infrequent or of short duration when for other reasons). If current dressing causes regression in wound condition, may D/C ordered dressing product/s and apply Normal Saline Moist Dressing daily until next [place] / Other MD appointment. [place] of regression in wound condition at [phone]. Please direct any NON-WOUND related issues/requests for orders to patient's Primary Care Physician 1. I would recommend currently that we switch to [facility] we will give this a try one more time and see if it hopefully will not stick especially for removed carefully. 2. I am also going to recommend that we continue with the bordered foam dressings over the areas as we can as that does help at some padding. 3. I am also going to suggest that she continue with appropriate offloading she does have the air mattress obviously the more that she can keep pressure off of the area the better even with the air mattress this still is good to require appropriate offloading. We will see patient back for reevaluation in 1 week here in the clinic. If anything worsens or changes patient will contact our office for additional recommendations. Electronic Signature(s) Signed: 08/12/2019 3:32:45 PM By: Worthy Keeler PA-C Entered By: Worthy Keeler on 08/12/2019 15:32:45 [place], [name]  ([id number]) -------------------------------------------------------------------------------- SuperBill Details Patient Name: [name], [name] Date of Service: 08/12/2019 Medical Record Number: [id number] Patient Account Number: [id number] Date of Birth/Sex: March 08, 1966 (54 y.o. F) Treating RN: Montey Hora Primary Care Provider: [place], JULIE Other Clinician: Referring Provider: [place], JULIE Treating Provider/Extender: Melburn Hake, Shemekia Patane Weeks in Treatment: 18 Diagnosis Coding ICD-10 Codes Code Description L89.313 Pressure ulcer of right buttock, stage 3 L89.323 Pressure ulcer of left buttock, stage 3 L89.213 Pressure ulcer of right hip, stage 3 G82.52 Quadriplegia, C1-C4 incomplete E44.0 Moderate protein-calorie malnutrition Facility Procedures CPT4 Code: [id number] Description: 99214 - WOUND CARE VISIT-LEV 4 EST PT Modifier: Quantity: 1 Physician Procedures CPT4 Code: [id number] Description: 27035 - WC PHYS LEVEL 3 - EST PT ICD-10 Diagnosis Description L89.313 Pressure ulcer of right buttock, stage 3 L89.323 Pressure ulcer of left buttock, stage 3 L89.213 Pressure ulcer of right hip, stage 3 G82.52 Quadriplegia, C1-C4 incomplete Modifier: Quantity: 1 Electronic Signature(s) Signed: 08/12/2019 3:33:09 PM By: Worthy Keeler PA-C Entered By: Worthy Keeler on 08/12/2019 15:33:09

## 2019-08-13 NOTE — Progress Notes (Signed)
[name], [name] ([id number]) Visit Report for 08/12/2019 Arrival Information Details Patient Name: [name], [name] Date of Service: 08/12/2019 2:45 PM Medical Record Number: [id number] Patient Account Number: [id number] Date of Birth/Sex: 12-16-1965 (54 y.o. F) Treating RN: [name] Primary Care [name]: [name] Other Clinician: Referring [name]: [name] Treating Tanina Barb/Extender: [name] Weeks in Treatment: 18 Visit Information History Since Last Visit Added or deleted any medications: No Patient Arrived: Wheel Chair Any new allergies or adverse reactions: No Arrival Time: 14:57 Had a fall or experienced change in No activities of daily living that may affect Accompanied By: caregiver risk of falls: Transfer Assistance: None Signs or symptoms of abuse/neglect since last visito No Patient Identification Verified: Yes Hospitalized since last visit: No Patient Requires Transmission-Based No Has Dressing in Place as Prescribed: Yes Precautions: Pain Present Now: No Patient Has Alerts: No Electronic Signature(s) Signed: 08/13/2019 11:18:27 AM By: [name] Entered By: [name] on 08/12/2019 14:57:18 [name] ([id number]) -------------------------------------------------------------------------------- Clinic Level of Care Assessment Details Patient Name: [name] Date of Service: 08/12/2019 2:45 PM Medical Record Number: [id number] Patient Account Number: [id number] Date of Birth/Sex: 02-10-1966 (54 y.o. F) Treating RN: [name] Primary Care Keaghan Bowens: [name] Other Clinician: Referring [name]: [name] Treating [name]/Extender: [name] Weeks in Treatment: 18 Clinic Level of Care Assessment Items TOOL 4 Quantity Score []  - Use when only an EandM is performed on FOLLOW-UP visit 0 ASSESSMENTS - Nursing Assessment / Reassessment X - Reassessment of Co-morbidities (includes updates in patient status) 1  10 X- 1 5 Reassessment of Adherence to Treatment Plan ASSESSMENTS - Wound and Skin Assessment / Reassessment []  - Simple Wound Assessment / Reassessment - one wound 0 X- 2 5 Complex Wound Assessment / Reassessment - multiple wounds []  - 0 Dermatologic / Skin Assessment (not related to wound area) ASSESSMENTS - Focused Assessment []  - Circumferential Edema Measurements - multi extremities 0 []  - 0 Nutritional Assessment / Counseling / Intervention []  - 0 Lower Extremity Assessment (monofilament, tuning fork, pulses) []  - 0 Peripheral Arterial Disease Assessment (using hand held doppler) ASSESSMENTS - Ostomy and/or Continence Assessment and Care X - Incontinence Assessment and Management 1 10 []  - 0 Ostomy Care Assessment and Management (repouching, etc.) PROCESS - Coordination of Care X - Simple Patient / Family Education for ongoing care 1 15 []  - 0 Complex (extensive) Patient / Family Education for ongoing care X- 1 10 Staff obtains , Records, Test Results / Process Orders []  - 0 Staff telephones HHA, Nursing Homes / Clarify orders / etc []  - 0 Routine Transfer to another Facility (non-emergent condition) []  - 0 Routine Hospital Admission (non-emergent condition) []  - 0 New Admissions / / Ordering NPWT, Apligraf, etc. []  - 0 Emergency Hospital Admission (emergent condition) X- 1 10 Simple Discharge Coordination [name] ( ) []  - 0 Complex (extensive) Discharge Coordination PROCESS - Special Needs []  - Pediatric / Minor Patient Management 0 []  - 0 Isolation Patient Management []  - 0 Hearing / Language / Visual special needs []  - 0 Assessment of Community assistance (transportation, D/C planning, etc.) []  - 0 Additional assistance / Altered mentation []  - 0 Support Surface(s) Assessment (bed, cushion, seat, etc.) INTERVENTIONS - Wound Cleansing / Measurement []  - Simple Wound Cleansing - one wound 0 X- 2  5 Complex Wound Cleansing - multiple wounds X- 1 5 Wound Imaging (photographs - any number of wounds) []  - 0 Wound Tracing (instead of photographs) []  -  0 Simple Wound Measurement - one wound X- 2 5 Complex Wound Measurement - multiple wounds INTERVENTIONS - Wound Dressings X - Small Wound Dressing one or multiple wounds 2 10 []  - 0 Medium Wound Dressing one or multiple wounds []  - 0 Large Wound Dressing one or multiple wounds []  - 0 Application of Medications - topical []  - 0 Application of Medications - injection INTERVENTIONS - Miscellaneous []  - External ear exam 0 []  - 0 Specimen Collection (cultures, biopsies, blood, body fluids, etc.) []  - 0 Specimen(s) / Culture(s) sent or taken to Lab for analysis []  - 0 Patient Transfer (multiple staff / / Similar devices) []  - 0 Simple Staple / Suture removal (25 or less) []  - 0 Complex Staple / Suture removal (26 or more) []  - 0 Hypo / Hyperglycemic Management (close monitor of Blood Glucose) []  - 0 Ankle / Brachial Index (ABI) - do not check if billed separately X- 1 5 Vital Signs [name] ( ) Has the patient been seen at the hospital within the last three years: Yes Total Score: 120 Level Of Care: New/Established - Level 4 Electronic Signature(s) Signed: 08/12/2019 4:13:47 PM By: Entered By: on 08/12/2019 15:27:37 Romm, ( ) -------------------------------------------------------------------------------- Encounter Discharge Information Details Patient Name: [name] Date of Service: 08/12/2019 2:45 PM Medical Record Number: Patient Account Number: Date of Birth/Sex: 08-04-1965 (54 y.o. F) Treating RN: 10/10/2019 Primary Care Noha Karasik: [name], [name] Other Clinician: Referring Delcie Ruppert: [name], [name] Treating Anastassia Noack/Extender: 10/10/2019, [name] Weeks in Treatment: 18 Encounter Discharge Information  Items Discharge Condition: Stable Ambulatory Status: Wheelchair Discharge Destination: Home Transportation: Private Auto Accompanied By: husband Schedule Follow-up Appointment: Yes Clinical Summary of Care: Electronic Signature(s) Signed: 08/12/2019 4:13:47 PM By: [id number] Entered By: [name] on 08/12/2019 15:26:52 Glander, [id number] ([id number]) -------------------------------------------------------------------------------- Lower Extremity Assessment Details Patient Name: [name], [name] Date of Service: 08/12/2019 2:45 PM Medical Record Number: [name] Patient Account Number: [name] Date of Birth/Sex: [birth year] (54 y.o. F) Treating RN: 10/10/2019 Primary Care Daphine Loch: [name], [name] Other Clinician: Referring Sherlynn Tourville: [name], [name] Treating Luka Stohr/Extender: 10/10/2019, [name] Weeks in Treatment: 18 Electronic Signature(s) Signed: 08/13/2019 11:18:27 AM By: [id number] Entered By: [name] on 08/12/2019 15:09:43 Pawelski, Kyerra [id number] ([id number]) -------------------------------------------------------------------------------- Multi Wound Chart Details Patient Name: [name], [name] Date of Service: 08/12/2019 2:45 PM Medical Record Number: [name] Patient Account Number: [name] Date of Birth/Sex: 05/13/1966 (54 y.o. F) Treating RN: 10/11/2019 Primary Care Marijean Montanye: [name], [name] Other Clinician: Referring Adorian Gwynne: [name], [name] Treating Salathiel Ferrara/Extender: STONE III, [name] Weeks in Treatment: 18 Vital Signs Height(in): 63 Pulse(bpm): 107 Weight(lbs): 70 Blood Pressure(mmHg): 84/52 Body Mass Index(BMI): 12 Temperature(F): 99.1 Respiratory Rate 16 (breaths/min): Photos: [N/A:N/A] Wound Location: Right Sacrum Right Trochanter N/A Wounding Event: Gradually Appeared Gradually Appeared N/A Primary Etiology: Pressure Ulcer Pressure Ulcer N/A Comorbid History: Asthma, Chronic Obstructive Asthma, Chronic Obstructive N/A Pulmonary Disease  (COPD), Pulmonary Disease (COPD), History of pressure wounds, History of pressure wounds, Paraplegia Paraplegia Date Acquired: 10/16/2018 04/02/2018 N/A Weeks of Treatment: 18 18 N/A Wound Status: Open Open N/A Clustered Wound: Yes No N/A Clustered Quantity: 3 N/A N/A Measurements L x W x D 7.5x10.6x1 6x3.9x0.8 N/A (cm) Area (cm) : 62.439 18.378 N/A Volume (cm) : 62.439 14.703 N/A % Reduction in Area: -32.50% -17.00% N/A % Reduction in Volume: -89.30% -4.00% N/A Classification: Category/Stage III Category/Stage III N/A Exudate Amount: Large Medium N/A Exudate Type: Serous Serous N/A Exudate Color: amber amber  N/A Wound Margin: Flat and Intact Flat and Intact N/A Granulation Amount: Medium (34-66%) Medium (34-66%) N/A Granulation Quality: Red Red N/A Necrotic Amount: Medium (34-66%) Medium (34-66%) N/A Exposed Structures: Fat Layer (Subcutaneous Fat Layer (Subcutaneous N/A Tissue) Exposed: Yes Tissue) Exposed: Yes Fascia: No Fascia: No Lerch, [name] ([id number]) Tendon: No Tendon: No Muscle: No Muscle: No Joint: No Joint: No Bone: No Bone: No Epithelialization: Small (1-33%) Small (1-33%) N/A Treatment Notes Electronic Signature(s) Signed: 08/12/2019 4:13:47 PM By: Montey Hora Entered By: Montey Hora on 08/12/2019 15:16:21 [name], [name] ([id number]) -------------------------------------------------------------------------------- Carmi Details Patient Name: [name], [name] Date of Service: 08/12/2019 2:45 PM Medical Record Number: [id number] Patient Account Number: [id number] Date of Birth/Sex: [birth year] (54 y.o. F) Treating RN: Montey Hora Primary Care Rie Mcneil: [place], [name] Other Clinician: Referring Ineze Serrao: [place], [name] Treating Samadhi Mahurin/Extender: Melburn Hake, [name] Weeks in Treatment: 18 Active Inactive Abuse / Safety / Falls / Self Care Management Nursing Diagnoses: Impaired physical mobility Goals: Patient will not  develop complications from immobility Date Initiated: 04/08/2019 Target Resolution Date: 07/12/2019 Goal Status: Active Interventions: Assess fall risk on admission and as needed Notes: Nutrition Nursing Diagnoses: Potential for alteratiion in Nutrition/Potential for imbalanced nutrition Goals: Patient/caregiver agrees to and verbalizes understanding of need to use nutritional supplements and/or vitamins as prescribed Date Initiated: 04/08/2019 Target Resolution Date: 07/12/2019 Goal Status: Active Interventions: Assess patient nutrition upon admission and as needed per policy Notes: Pressure Nursing Diagnoses: Knowledge deficit related to management of pressures ulcers Goals: Patient will remain free of pressure ulcers Date Initiated: 04/08/2019 Target Resolution Date: 07/12/2019 Goal Status: Active Interventions: Assess potential for pressure ulcer upon admission and as needed [name], [name] ([id number]) Notes: Wound/Skin Impairment Nursing Diagnoses: Impaired tissue integrity Goals: Ulcer/skin breakdown will heal within 14 weeks Date Initiated: 04/08/2019 Target Resolution Date: 07/12/2019 Goal Status: Active Interventions: Assess patient/caregiver ability to obtain necessary supplies Assess patient/caregiver ability to perform ulcer/skin care regimen upon admission and as needed Assess ulceration(s) every visit Notes: Electronic Signature(s) Signed: 08/12/2019 4:13:47 PM By: Montey Hora Entered By: Montey Hora on 08/12/2019 15:16:14 [name], [name] ([id number]) -------------------------------------------------------------------------------- Pain Assessment Details Patient Name: [name] Date of Service: 08/12/2019 2:45 PM Medical Record Number: [id number] Patient Account Number: [id number] Date of Birth/Sex: 1965/12/30 (54 y.o. F) Treating RN: Army Melia Primary Care Ayven Glasco: [place], [name] Other Clinician: Referring Charisma Charlot: [place], [name] Treating  Lyndel Dancel/Extender: STONE III, [name] Weeks in Treatment: 18 Active Problems Location of Pain Severity and Description of Pain Patient Has Paino No Site Locations Pain Management and Medication Current Pain Management: Electronic Signature(s) Signed: 08/13/2019 11:18:27 AM By: Army Melia Entered By: Army Melia on 08/12/2019 14:57:23 [name], [name] ([id number]) -------------------------------------------------------------------------------- Patient/Caregiver Education Details Patient Name: [name], [name] Date of Service: 08/12/2019 2:45 PM Medical Record Number: [id number] Patient Account Number: [id number] Date of Birth/Gender: 1966-02-28 (54 y.o. F) Treating RN: Montey Hora Primary Care Physician: [place], [name] Other Clinician: Referring Physician: [place], [name] Treating Physician/Extender: Sharalyn Ink in Treatment: 110 Education Assessment Education Provided To: Patient and Caregiver Education Topics Provided Wound/Skin Impairment: Handouts: Other: wound care as ordered Methods: Demonstration, Explain/Verbal Responses: State content correctly Electronic Signature(s) Signed: 08/12/2019 4:13:47 PM By: Montey Hora Entered By: Montey Hora on 08/12/2019 15:27:54 [name], [name] ([id number]) -------------------------------------------------------------------------------- Wound Assessment Details Patient Name: [name] Date of Service: 08/12/2019 2:45 PM Medical Record Number: [id number] Patient Account Number: [id number] Date of Birth/Sex: [birth year] (54 y.o. F) Treating RN: Army Melia Primary Care Ameer Sanden: [place], [name] Other  Clinician: Referring Savio Albrecht: [name] Treating Zaiyah Sottile/Extender: STONE III, [name] Weeks in Treatment: 18 Wound Status Wound Number: 5 Primary Pressure Ulcer Etiology: Wound Location: Right Sacrum Wound Open Wounding Event: Gradually Appeared Status: Date Acquired: 10/16/2018 Comorbid Asthma, Chronic  Obstructive Pulmonary Weeks Of Treatment: 18 History: Disease (COPD), History of pressure wounds, Clustered Wound: Yes Paraplegia Photos Wound Measurements Length: (cm) 7.5 % Reduction in Width: (cm) 10.6 % Reduction in Depth: (cm) 1 Epithelializat Clustered Quantity: 3 Tunneling: Area: (cm) 62.439 Undermining: Volume: (cm) 62.439 Area: -32.5% Volume: -89.3% ion: Small (1-33%) No No Wound Description Classification: Category/Stage III Foul Odor Afte Wound Margin: Flat and Intact Slough/Fibrino Exudate Amount: Large Exudate Type: Serous Exudate Color: amber r Cleansing: No Yes Wound Bed Granulation Amount: Medium (34-66%) Exposed Structure Granulation Quality: Red Fascia Exposed: No Necrotic Amount: Medium (34-66%) Fat Layer (Subcutaneous Tissue) Exposed: Yes Necrotic Quality: Adherent Slough Tendon Exposed: No Muscle Exposed: No Joint Exposed: No Bone Exposed: No Handy, Davionna [name]. ([id number]) Treatment Notes Wound #5 (Right Sacrum) Notes hydrofera blue, abd and tape Electronic Signature(s) Signed: 08/13/2019 11:18:27 AM By: [name] Entered By: [name] on 08/12/2019 15:09:03 Doeden, Ivalene [name]. ([id number]) -------------------------------------------------------------------------------- Wound Assessment Details Patient Name: [name] Date of Service: 08/12/2019 2:45 PM Medical Record Number: [id number] Patient Account Number: [id number] Date of Birth/Sex: 1966/01/20 (54 y.o. F) Treating RN: [name] Primary Care Abrial Arrighi: [name] Other Clinician: Referring Leoma Folds: [name] Treating Aylana Hirschfeld/Extender: STONE III, [name] Weeks in Treatment: 18 Wound Status Wound Number: 7 Primary Pressure Ulcer Etiology: Wound Location: Right Trochanter Wound Open Wounding Event: Gradually Appeared Status: Date Acquired: 04/02/2018 Comorbid Asthma, Chronic Obstructive Pulmonary Weeks Of Treatment: 18 History: Disease (COPD), History of  pressure wounds, Clustered Wound: No Paraplegia Photos Wound Measurements Length: (cm) 6 % Reduction in A Width: (cm) 3.9 % Reduction in V Depth: (cm) 0.8 Epithelializatio Area: (cm) 18.378 Volume: (cm) 14.703 rea: -17% olume: -4% n: Small (1-33%) Wound Description Classification: Category/Stage III Foul Odor After Wound Margin: Flat and Intact Slough/Fibrino Exudate Amount: Medium Exudate Type: Serous Exudate Color: amber Cleansing: No No Wound Bed Granulation Amount: Medium (34-66%) Exposed Structure Granulation Quality: Red Fascia Exposed: No Necrotic Amount: Medium (34-66%) Fat Layer (Subcutaneous Tissue) Exposed: Yes Necrotic Quality: Adherent Slough Tendon Exposed: No Muscle Exposed: No Joint Exposed: No Bone Exposed: No Treatment Notes Heninger, Daniyla [name]. ([id number]) Wound #7 (Right Trochanter) Notes hydrofera blue, abd and tape Electronic Signature(s) Signed: 08/13/2019 11:18:27 AM By: [name] Entered By: [name] on 08/12/2019 15:09:32 Pizzimenti, Balinda [name] ([id number]) -------------------------------------------------------------------------------- Vitals Details Patient Name: [name] Date of Service: 08/12/2019 2:45 PM Medical Record Number: [id number] Patient Account Number: [id number] Date of Birth/Sex: [birth year] (54 y.o. F) Treating RN: [name] Primary Care Narvel Kozub: [name] Other Clinician: Referring Maryetta Shafer: [name] Treating Anmol Paschen/Extender: STONE III, [name] Weeks in Treatment: 18 Vital Signs Time Taken: 14:57 Temperature (F): 99.1 Height (in): 63 Pulse (bpm): 107 Weight (lbs): 70 Respiratory Rate (breaths/min): 16 Body Mass Index (BMI): 12.4 Blood Pressure (mmHg): 84/52 Reference Range: 80 - 120 mg / dl Electronic Signature(s) Signed: 08/13/2019 11:18:27 AM By: [name] Entered By: [name] on 08/12/2019 14:57:43

## 2019-08-18 ENCOUNTER — Ambulatory Visit: Payer: Medicare Other | Admitting: Physician Assistant

## 2019-08-22 ENCOUNTER — Ambulatory Visit: Payer: Medicare Other | Admitting: Physician Assistant

## 2019-08-28 ENCOUNTER — Ambulatory Visit: Payer: Medicare Other | Admitting: Physician Assistant

## 2019-09-04 ENCOUNTER — Other Ambulatory Visit: Payer: Self-pay

## 2019-09-04 ENCOUNTER — Encounter: Payer: Medicare Other | Attending: Physician Assistant | Admitting: Physician Assistant

## 2019-09-04 DIAGNOSIS — J439 Emphysema, unspecified: Secondary | ICD-10-CM | POA: Diagnosis not present

## 2019-09-04 DIAGNOSIS — Z87891 Personal history of nicotine dependence: Secondary | ICD-10-CM | POA: Diagnosis not present

## 2019-09-04 DIAGNOSIS — L89323 Pressure ulcer of left buttock, stage 3: Secondary | ICD-10-CM | POA: Diagnosis not present

## 2019-09-04 DIAGNOSIS — L89213 Pressure ulcer of right hip, stage 3: Secondary | ICD-10-CM | POA: Diagnosis not present

## 2019-09-04 DIAGNOSIS — E440 Moderate protein-calorie malnutrition: Secondary | ICD-10-CM | POA: Insufficient documentation

## 2019-09-04 DIAGNOSIS — G8252 Quadriplegia, C1-C4 incomplete: Secondary | ICD-10-CM | POA: Insufficient documentation

## 2019-09-04 DIAGNOSIS — L89313 Pressure ulcer of right buttock, stage 3: Secondary | ICD-10-CM | POA: Insufficient documentation

## 2019-09-04 NOTE — Progress Notes (Addendum)
NATALIJA, MAVIS ([id number]) Visit Report for 09/04/2019 Chief Complaint Document Details Patient Name: [name], [name] Date of Service: 09/04/2019 2:45 PM Medical Record Number: [id number] Patient Account Number: [id number] Date of Birth/Sex: [birth year] (54 y.o. F) Treating RN: Rodell Perna Primary Care Provider: [place], JULIE Other Clinician: Referring Provider: [place], JULIE Treating Provider/Extender: Linwood Dibbles, Velva Molinari Weeks in Treatment: 21 Information Obtained from: Patient Chief Complaint Bilateral gluteal and right hip pressure ulcers Electronic Signature(s) Signed: 09/04/2019 3:28:16 PM By: Lenda Kelp PA-C Entered By: Lenda Kelp on 09/04/2019 15:28:16 Weiskopf, [name] ([id number]) -------------------------------------------------------------------------------- HPI Details Patient Name: CHEYNNE, VIRDEN. Date of Service: 09/04/2019 2:45 PM Medical Record Number: [id number] Patient Account Number: [id number] Date of Birth/Sex: 09-Jun-1966 (54 y.o. F) Treating RN: Rodell Perna Primary Care Provider: [place], JULIE Other Clinician: Referring Provider: [place], JULIE Treating Provider/Extender: Linwood Dibbles, Lakeesha Fontanilla Weeks in Treatment: 21 History of Present Illness HPI Description: 54 year old patient who was been an unreliable historian says she had completely healed in February and most recently her home health noticed that the wound on the right scapular area had reopened. She was in hospital about a month ago with pneumonia at that time a workup was done. We have no notes today but will workup the electronic medical record. on review of her records electronically I do not find any recent hospital MRI in the last 6 months. She had a CT scan of the chest in February 2018 which does not show any osseous lesions. she was admitted to the hospital between May 14 and May 17 for 3 days for mucus plugging and collapse of the left lung. at that time she had a decubitus ulcer of the right  scapular region stage III. her history was noted to have quadriplegia C5-C7 incomplete, neurogenic bladder, nephrostomy catheter and emphysema. patient tells me that she has not smoked for last month and a half but as per her hospital records she was smoking in the middle of May. 01/12/17 patient scapular region appears to be doing fairly well on evaluation today. She is having really no syndicate discomfort and has been tolerating the dressing changes well complication. Readmission: 04/08/2019 patient presents today for reevaluation in our clinic concerning issues she is having with wounds on the bilateral gluteal area as well as on the right trochanter. She has unfortunately noted that things seem to have gotten worse as she was using the Clinitron bed. The patient states it gives off a lot of heat and subsequently has not been good for her skin causing her to breakdown more. She has been trying to get back just to the air mattress but has not been able to get the company to come pick up that bed and bring out the air mattress. Her primary care provider is on this as well trying to get them to do so. Unfortunately her wounds are significantly large and show signs of hyper granulation as well. She has had this kind of issue for quite some time and unfortunately just does not seem to be showing signs of significant improvement with what she has been using currently. No fevers, chills, nausea, vomiting, or diarrhea. She does attempt appropriate offloading and again has been using the bed because that is all she has although she is not happy with it. 04/22/2019 on evaluation today patient appears to be doing about the same with regard to her wounds at this time. Fortunately there does not appear to be any signs of active infection at this point which  is good news. She has been tolerating the dressing changes without complication. Overall I am pleased with that with that being said unfortunately she does  have a little bit of more significant breakdown with regard to the wound bed upon evaluation today. I believe this is secondary to the fact that she has been lying potentially too much in a single position for too long over the past several weeks. I think that potentially if she were to mitigate this to a degree that things may improve somewhat. With that being said she is really not happy with her current bed she is attempted to have them come out and switch this back to an air mattress but unfortunately has not been successful in getting them to come out and make this change. She is very frustrated with that. She still has the Clinitron bed and she much prefers just having the standard air mattress. Patient's primary care provider still working on this she tells me. 05/08/2019 on evaluation today patient unfortunately is not doing as well as I would like to see in regard to her wounds. She actually is measuring a little bit larger pretty much at all wound locations unfortunately and overall I am very upset as well with the fact that she has been trying to get the Clinitron bed removed from her home but is not having any luck as such. We did get the number for the company today in order for [place] to see about calling them and switching this over to an air mattress as this is giving her a lot of trouble she states is really hot and she feels like it is breaking down her skin. This is not good. 05/22/2019 on evaluation today patient appears to be doing well with regard to the wounds she is showing some signs of slight improvement at this point. This is good news. Fortunately there is no evidence of active infection at this time which is also good news. No fevers, chills, nausea, vomiting, or diarrhea. She does tell me that in order to get rid of the Clinitron bed that she has been working on that the representative from the company stated they needed in order for me asked to be getting rid of it and then  what we wanted her to have. Again I will be more than happy to do that we can initiate an air mattress for her. 06/06/2019 on evaluation today patient continues to have issues with her wounds. The good news is we figured out what to be done for the her bad as far as get not moved however she is getting actually ready to move and change residences. For that reason what they are going to do is come in and take the bed from the current residence to get rid of the Clinitron bed and then subsequently move the new bed into her new place of residence. This makes complete sense to me as well and I think that sounds like a good plan. At least we have a plan for what to do as far as getting things taken care of here. 06/24/2019 on evaluation today patient appears to be doing really about the same in some regards although a couple of the wounds are measuring a little bit larger and deeper compared to previous. With that being said I do think that unfortunately her wounds seem to be overall progressing in the wrong direction towards being worse not better. The patient is still having to use the Clinitron bed at  this time I do think that she really needs to switch to just a standard air mattress that is what she wants but she is having to wait until she moves in 2 weeks before she can get the new mattress so she does not have to pay to have the bed moved twice. 07/18/2019 on evaluation today patient appears to actually be doing somewhat better in regard to her wounds in general. I am very pleased in this TRINITEY, ROACHE. ([id number]) regard. With that being said she still has some hyper granulation unfortunately the Hydrofera Blue just was getting stuck to her and the silver cell has done better. Nonetheless I do believe that we do need to probably treat this with some silver nitrate to try to help with the hypergranulation I discussed this with the patient today. She is in agreement with this plan. 07/29/2019 on  evaluation today patient seems to be making some progress albeit slow regard to her wounds. Fortunately there is no signs of active infection at this time. No fever chills noted. She overall seems to be doing quite well at this point. 08/12/2019 upon evaluation today patient appears to be doing about the same at this point in regard to her wounds. She still has some hyper granulation. I really feel like she would do better with Hydrofera Blue over the alginate itself. With that being said we have not been able to use that for a while as it was sticking but again it has been sometime she was having issues with a different bed at that time as well which may have contributed to some of the issues at that point. Fortunately there is no evidence of active infection at this time. No fevers, chills, nausea, vomiting, or diarrhea. 09/04/2019 upon evaluation today patient actually appears to be doing excellent in regard to her wounds across the board. I am extremely pleased with how things appear today. She has a lot of new epithelization, good granulation, and overall she states that she is doing very well with the current bed. Everything about today seems to be on the up and up. 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 For about 4-5 months she's had a decubitus ulcer on the right scapular region. she was lost to follow-up for the last 6 months. she had a couple of small areas which have opened out in the region of previous scar tissue in the sacral region but these haveall healed now. She does not have any other significant comorbidities. She has a good Roho cushion for her wheelchair and she has a specialized mattress for sleep. She has restarted smoking and smokes about half a pack of cigarettes a day 02/18/2015 -- X-ray of the pelvis -- IMPRESSION:No acute osseous injury of the  pelvis. If there is clinical concern regarding osteomyelitis, recommend further evaluation with CT. X-ray of the sacrum and coccyx IMPRESSION:No acute osseous abnormality of the sacrum. If there is further clinical concern regarding osteomyelitis of the sacrum, further evaluation with CT is recommended. ========= Electronic Signature(s) Signed: 09/04/2019 3:48:28 PM By: Lenda Kelp PA-C Entered By: Lenda Kelp on 09/04/2019 15:48:28 Dolph, [name] ([id number]) -------------------------------------------------------------------------------- Physical Exam Details Patient Name: GUILIANNA, MCKOY. Date of Service: 09/04/2019 2:45 PM Medical Record Number: [id number] Patient Account Number: [id number] Date of Birth/Sex: Aug 13, 1965 (54 y.o. F) Treating RN: Rodell Perna Primary Care Provider: [place], JULIE Other  Clinician: Referring Provider: [place], JULIE Treating Provider/Extender: STONE III, Jaryah Aracena Weeks in Treatment: 21 Constitutional Thin and well-hydrated in no acute distress. Respiratory normal breathing without difficulty. Psychiatric this patient is able to make decisions and demonstrates good insight into disease process. Alert and Oriented x 3. pleasant and cooperative. Notes Patient's wound bed currently showed signs of good granulation at this time. Fortunately there is no evidence of active infection she has a lot of good epithelial tissue noted as well overall I am extremely pleased with how things seem to be progressing I think the new air mattress has done better for her. Electronic Signature(s) Signed: 09/04/2019 3:48:47 PM By: Lenda Kelp PA-C Entered By: Lenda Kelp on 09/04/2019 15:48:46 Liskey, [name] ([id number]) -------------------------------------------------------------------------------- Physician Orders Details Patient Name: LILLIEANNA, TUOHY. Date of Service: 09/04/2019 2:45 PM Medical Record Number: [id number] Patient Account Number:  [id number] Date of Birth/Sex: [birth year] (54 y.o. F) Treating RN: Rodell Perna Primary Care Provider: [place], JULIE Other Clinician: Referring Provider: [place], JULIE Treating Provider/Extender: Linwood Dibbles, Romario Tith Weeks in Treatment: 21 Verbal / Phone Orders: No Diagnosis Coding ICD-10 Coding Code Description L89.313 Pressure ulcer of right buttock, stage 3 L89.323 Pressure ulcer of left buttock, stage 3 L89.213 Pressure ulcer of right hip, stage 3 G82.52 Quadriplegia, C1-C4 incomplete E44.0 Moderate protein-calorie malnutrition Wound Cleansing Wound #5 Right Sacrum o Clean wound with Normal Saline. o May Shower, gently pat wound dry prior to applying new dressing. Wound #7 Right Trochanter o Clean wound with Normal Saline. o May Shower, gently pat wound dry prior to applying new dressing. Primary Wound Dressing Wound #5 Right Sacrum o Hydrafera Blue Ready Transfer - Please order Hydrofera Blue Ready Transfer specifically for patient r/t wound location and wet with saline or warm soapy water prior to removal Wound #7 Right Trochanter o Hydrafera Blue Ready Transfer - Please order Hydrofera Blue Ready Transfer specifically for patient r/t wound location and wet with saline or warm soapy water prior to removal Secondary Dressing Wound #5 Right Sacrum o ABD pad - Secure with tape Wound #7 Right Trochanter o ABD pad - Secure with tape Dressing Change Frequency Wound #5 Right Sacrum o Change Dressing Monday, Wednesday, Friday - or three times weekly Wound #7 Right Trochanter o Change Dressing Monday, Wednesday, Friday - or three times weekly Follow-up Appointments Wound #5 Right Sacrum o Return Appointment in 1 week. Wound #7 Right Trochanter o Return Appointment in 1 week. Off-Loading Wound #5 Right Sacrum o Mattress - air mattress or fluidized air mattress o Turn and reposition every 2 hours JUMANAH, HYNSON. ([id number]) Wound #7 Right  Trochanter o Mattress - air mattress or fluidized air mattress o Turn and reposition every 2 hours Additional Orders / Instructions Wound #5 Right Sacrum o Increase protein intake. o Increase protein intake. Wound #7 Right Trochanter o Increase protein intake. o Increase protein intake. Home Health Wound #5 Right Sacrum o Continue Home Health Visits - [facility] - Please provide dressing materials for patient o Home Health Nurse may visit PRN to address patientos wound care needs. o FACE TO FACE ENCOUNTER: MEDICARE and MEDICAID PATIENTS: I certify that this patient is under my care and that I had a face-to- face encounter that meets the physician face-to-face encounter requirements with this patient on this date. The encounter with the patient was in whole or in part for the following MEDICAL CONDITION: (primary reason for Home Healthcare) MEDICAL NECESSITY: I certify, that based on my findings, NURSING services are a  medically necessary home health service. HOME BOUND STATUS: I certify that my clinical findings support that this patient is homebound (i.e., Due to illness or injury, pt requires aid of supportive devices such as crutches, cane, wheelchairs, walkers, the use of special transportation or the assistance of another person to leave their place of residence. There is a normal inability to leave the home and doing so requires considerable and taxing effort. Other absences are for medical reasons / religious services and are infrequent or of short duration when for other reasons). o If current dressing causes regression in wound condition, may D/C ordered dressing product/s and apply Normal Saline Moist Dressing daily until next [place] / Other MD appointment. [place] of regression in wound condition at [phone]. o Please direct any NON-WOUND related issues/requests for orders to patient's Primary Care Physician Wound #7 Right  [place] Visits - [facility] - Please provide dressing materials for patient o Home Health Nurse may visit PRN to address patientos wound care needs. o FACE TO FACE ENCOUNTER: MEDICARE and MEDICAID PATIENTS: I certify that this patient is under my care and that I had a face-to- face encounter that meets the physician face-to-face encounter requirements with this patient on this date. The encounter with the patient was in whole or in part for the following MEDICAL CONDITION: (primary reason for [place]) MEDICAL NECESSITY: I certify, that based on my findings, NURSING services are a medically necessary home health service. HOME BOUND STATUS: I certify that my clinical findings support that this patient is homebound (i.e., Due to illness or injury, pt requires aid of supportive devices such as crutches, cane, wheelchairs, walkers, the use of special transportation or the assistance of another person to leave their place of residence. There is a normal inability to leave the home and doing so requires considerable and taxing effort. Other absences are for medical reasons / religious services and are infrequent or of short duration when for other reasons). o If current dressing causes regression in wound condition, may D/C ordered dressing product/s and apply Normal Saline Moist Dressing daily until next [place] / Other MD appointment. [place] of regression in wound condition at [phone]. o Please direct any NON-WOUND related issues/requests for orders to patient's Primary Care Physician Electronic Signature(s) Signed: 09/04/2019 5:37:52 PM By: Worthy Keeler PA-C Signed: 09/08/2019 8:02:25 AM By: Army Melia Entered By: Army Melia on 09/04/2019 15:49:17 Topp, [name] ([id number]) -------------------------------------------------------------------------------- Problem List Details Patient Name: PARIS, HOHN. Date  of Service: 09/04/2019 2:45 PM Medical Record Number: [id number] Patient Account Number: [id number] Date of Birth/Sex: [birth year] (54 y.o. F) Treating RN: Army Melia Primary Care Provider: [place], JULIE Other Clinician: Referring Provider: [place], JULIE Treating Provider/Extender: Melburn Hake, Fidelia Cathers Weeks in Treatment: 21 Active Problems ICD-10 Evaluated Encounter Code Description Active Date Today Diagnosis L89.313 Pressure ulcer of right buttock, stage 3 04/08/2019 No Yes L89.323 Pressure ulcer of left buttock, stage 3 04/08/2019 No Yes L89.213 Pressure ulcer of right hip, stage 3 04/08/2019 No Yes G82.52 Quadriplegia, C1-C4 incomplete 04/08/2019 No Yes E44.0 Moderate protein-calorie malnutrition 04/08/2019 No Yes Inactive Problems Resolved Problems Electronic Signature(s) Signed: 09/04/2019 3:27:40 PM By: Worthy Keeler PA-C Entered By: Worthy Keeler on 09/04/2019 15:27:38 Gielow, [name] ([id number]) -------------------------------------------------------------------------------- Progress Note Details Patient Name: Graylon Good. Date of Service: 09/04/2019 2:45 PM Medical Record Number: [id number] Patient Account Number: [id number] Date of Birth/Sex: December 30, 1965 (54 y.o. F) Treating RN:  Rodell Perna Primary Care Provider: [place], JULIE Other Clinician: Referring Provider: [place], JULIE Treating Provider/Extender: Linwood Dibbles, Sira Adsit Weeks in Treatment: 21 Subjective Chief Complaint Information obtained from Patient Bilateral gluteal and right hip pressure ulcers History of Present Illness (HPI) 54 year old patient who was been an unreliable historian says she had completely healed in February and most recently her home health noticed that the wound on the right scapular area had reopened. She was in hospital about a month ago with pneumonia at that time a workup was done. We have no notes today but will workup the electronic medical record. on review of her records electronically I  do not find any recent hospital MRI in the last 6 months. She had a CT scan of the chest in February 2018 which does not show any osseous lesions. she was admitted to the hospital between May 14 and May 17 for 3 days for mucus plugging and collapse of the left lung. at that time she had a decubitus ulcer of the right scapular region stage III. her history was noted to have quadriplegia C5-C7 incomplete, neurogenic bladder, nephrostomy catheter and emphysema. patient tells me that she has not smoked for last month and a half but as per her hospital records she was smoking in the middle of May. 01/12/17 patient scapular region appears to be doing fairly well on evaluation today. She is having really no syndicate discomfort and has been tolerating the dressing changes well complication. Readmission: 04/08/2019 patient presents today for reevaluation in our clinic concerning issues she is having with wounds on the bilateral gluteal area as well as on the right trochanter. She has unfortunately noted that things seem to have gotten worse as she was using the Clinitron bed. The patient states it gives off a lot of heat and subsequently has not been good for her skin causing her to breakdown more. She has been trying to get back just to the air mattress but has not been able to get the company to come pick up that bed and bring out the air mattress. Her primary care provider is on this as well trying to get them to do so. Unfortunately her wounds are significantly large and show signs of hyper granulation as well. She has had this kind of issue for quite some time and unfortunately just does not seem to be showing signs of significant improvement with what she has been using currently. No fevers, chills, nausea, vomiting, or diarrhea. She does attempt appropriate offloading and again has been using the bed because that is all she has although she is not happy with it. 04/22/2019 on evaluation today patient  appears to be doing about the same with regard to her wounds at this time. Fortunately there does not appear to be any signs of active infection at this point which is good news. She has been tolerating the dressing changes without complication. Overall I am pleased with that with that being said unfortunately she does have a little bit of more significant breakdown with regard to the wound bed upon evaluation today. I believe this is secondary to the fact that she has been lying potentially too much in a single position for too long over the past several weeks. I think that potentially if she were to mitigate this to a degree that things may improve somewhat. With that being said she is really not happy with her current bed she is attempted to have them come out and switch this back to an  air mattress but unfortunately has not been successful in getting them to come out and make this change. She is very frustrated with that. She still has the Clinitron bed and she much prefers just having the standard air mattress. Patient's primary care provider still working on this she tells me. 05/08/2019 on evaluation today patient unfortunately is not doing as well as I would like to see in regard to her wounds. She actually is measuring a little bit larger pretty much at all wound locations unfortunately and overall I am very upset as well with the fact that she has been trying to get the Clinitron bed removed from her home but is not having any luck as such. We did get the number for the company today in order for us to see about calling them and switching this over to an air mattress as this is giving her a lot of trouble she states is really hot and she feels like it is breaking down her skin. This is not good. 05/22/2019 on evaluation today patient appears to be doing well with regard to the wounds she is showing some signs of slight improvement at this point. This is good news. Fortunately there is no  evidence of active infection at this time which is also good news. No fevers, chills, nausea, vomiting, or diarrhea. She does tell me that in order to get rid of the Clinitron bed that she has been working on that the representative from the company stated they needed in order for me asked to be getting rid of it and then what we wanted her to have. Again I will be more than happy to do that we can initiate an air mattress for her. 06/06/2019 on evaluation today patient continues to have issues with her wounds. The good news is we figured out what to be done for the her bad as far as get not moved however she is getting actually ready to move and change residences. For that reason what they are going to do is come in and take the bed from the current residence to get rid of the [place] bed and then subsequently move the new bed into her new place of residence. This makes complete sense to me as well and I think that sounds like a good plan. At least we have a plan for what to do as far as getting things taken care of here. 06/24/2019 on evaluation today patient appears to be doing really about the same in some regards although a couple of the wounds are measuring a little bit larger and deeper compared to previous. With that being said I do think that unfortunately her wounds seem to be overall progressing in the wrong direction towards being worse not better. The patient is still having to use the [place] bed at this time I do think that she really needs to switch [name], Carter M. ([id number]) to just a standard air mattress that is what she wants but she is having to wait until she moves in 2 weeks before she can get the new mattress so she does not have to pay to have the bed moved twice. 07/18/2019 on evaluation today patient appears to actually be doing somewhat better in regard to her wounds in general. I am very pleased in this regard. With that being said she still has some hyper  granulation unfortunately the Hydrofera Blue just was getting stuck to her and the silver cell has done better. Nonetheless I do believe  that we do need to probably treat this with some silver nitrate to try to help with the hypergranulation I discussed this with the patient today. She is in agreement with this plan. 07/29/2019 on evaluation today patient seems to be making some progress albeit slow regard to her wounds. Fortunately there is no signs of active infection at this time. No fever chills noted. She overall seems to be doing quite well at this point. 08/12/2019 upon evaluation today patient appears to be doing about the same at this point in regard to her wounds. She still has some hyper granulation. I really feel like she would do better with Hydrofera Blue over the alginate itself. With that being said we have not been able to use that for a while as it was sticking but again it has been sometime she was having issues with a different bed at that time as well which may have contributed to some of the issues at that point. Fortunately there is no evidence of active infection at this time. No fevers, chills, nausea, vomiting, or diarrhea. 09/04/2019 upon evaluation today patient actually appears to be doing excellent in regard to her wounds across the board. I am extremely pleased with how things appear today. She has a lot of new epithelization, good granulation, and overall she states that she is doing very well with the current bed. Everything about today seems to be on the up and up. 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 For about 4-5 months she's had a decubitus ulcer on the right scapular region. she was lost to follow-up for the last 6 months. she had a couple of small areas which have opened out in the region of previous scar tissue in the sacral region but  these haveall healed now. She does not have any other significant comorbidities. She has a good Roho cushion for her wheelchair and she has a specialized mattress for sleep. She has restarted smoking and smokes about half a pack of cigarettes a day 02/18/2015 -- X-ray of the pelvis -- IMPRESSION:No acute osseous injury of the pelvis. If there is clinical concern regarding osteomyelitis, recommend further evaluation with CT. X-ray of the sacrum and coccyx IMPRESSION:No acute osseous abnormality of the sacrum. If there is further clinical concern regarding osteomyelitis of the sacrum, further evaluation with CT is recommended. ========= Objective Constitutional Thin and well-hydrated in no acute distress. Vitals Time Taken: 3:13 PM, Height: 63 in, Weight: 70 lbs, BMI: 12.4, Temperature: 98.2 F, Pulse: 79 bpm, Respiratory Rate: 16 breaths/min, Blood Pressure: 115/67 mmHg. Respiratory normal breathing without difficulty. Psychiatric this patient is able to make decisions and demonstrates good insight into disease process. Alert and Oriented x 3. pleasant and cooperative. General Notes: Patient's wound bed currently showed signs of good granulation at this time. Fortunately there is no evidence of active infection she has a lot of good epithelial tissue noted as well overall I am extremely pleased with how things seem to be progressing I think the new air mattress has done better for her. Integumentary (Hair, Skin) Wound #5 status is Open. Original cause of wound was Gradually Appeared. The wound is located on the Right Sacrum. The wound measures 7.5cm length x 9cm width x 0.2cm depth; 53.014cm^2 area and 10.603cm^3 volume. Wound #7 status is Open. Original cause of wound was Gradually Appeared. The wound is located on the Right Trochanter.  The wound measures 4.5cm length x 4.5cm width x 0.6cm depth; 15.904cm^2 area and 9.543cm^3 volume. There is Fat Layer (Subcutaneous Tissue) Exposed  exposed. There is undermining starting at 8:00 and ending at 4:00 with a maximum distance of 1.3cm. There is a medium amount of serous drainage noted. The wound margin is flat and intact. There is medium (34-66%) red granulation within the wound bed. There is a medium (34-66%) amount of necrotic Obar, Donyel M. ([id number]) tissue within the wound bed including Adherent Slough. 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 May Shower, gently pat wound dry prior to applying new dressing. Wound #7 Right Trochanter: Clean wound with Normal Saline. May Shower, gently pat wound dry prior to applying new dressing. Primary Wound Dressing: Wound #5 Right Sacrum: Hydrafera Blue Ready Transfer - Please order Hydrofera Blue Ready Transfer specifically for patient r/t wound location and wet with saline or warm soapy water prior to removal Wound #7 Right Trochanter: Hydrafera Blue Ready Transfer - Please order Hydrofera Blue Ready Transfer specifically for patient r/t wound location and wet with saline or warm soapy water prior to removal Secondary Dressing: Wound #5 Right Sacrum: ABD pad - Secure with tape Wound #7 Right Trochanter: ABD pad - Secure with tape Dressing Change Frequency: Wound #5 Right Sacrum: Change Dressing Monday, Wednesday, Friday - or three times weekly Wound #7 Right Trochanter: Change Dressing Monday, Wednesday, Friday - or three times weekly Follow-up Appointments: Wound #5 Right Sacrum: Return Appointment in 1 week. Wound #7 Right Trochanter: Return Appointment in 1 week. Off-Loading: Wound #5 Right Sacrum: Mattress - air mattress or fluidized air mattress Turn and reposition every 2 hours Wound #7 Right  Trochanter: Mattress - air mattress or fluidized air mattress Turn and reposition every 2 hours Additional Orders / Instructions: Wound #5 Right Sacrum: Increase protein intake. Increase protein intake. Wound #7 Right Trochanter: Increase protein intake. Increase protein intake. Home Health: Wound #5 Right Sacrum: Continue Home Health Visits - [facility] - Please provide dressing materials for patient Home Health Nurse may visit PRN to address patient s wound care needs. JULLIA, MULLIGAN ([id number]) FACE TO FACE ENCOUNTER: MEDICARE and MEDICAID PATIENTS: I certify that this patient is under my care and that I had a face-to-face encounter that meets the physician face-to-face encounter requirements with this patient on this date. The encounter with the patient was in whole or in part for the following MEDICAL CONDITION: (primary reason for Home Healthcare) MEDICAL NECESSITY: I certify, that based on my findings, NURSING services are a medically necessary home health service. HOME BOUND STATUS: I certify that my clinical findings support that this patient is homebound (i.e., Due to illness or injury, pt requires aid of supportive devices such as crutches, cane, wheelchairs, walkers, the use of special transportation or the assistance of another person to leave their place of residence. There is a normal inability to leave the home and doing so requires considerable and taxing effort. Other absences are for medical reasons / religious services and are infrequent or of short duration when for other reasons). If current dressing causes regression in wound condition, may D/C ordered dressing product/s and apply Normal Saline Moist Dressing daily until next Wound Healing Center / Other MD appointment. Notify Wound Healing Center of regression in wound condition  at [phone]. Please direct any NON-WOUND related issues/requests for orders to patient's Primary Care Physician Wound #7 Right  Trochanter: Continue Home Health Visits - [facility] - Please provide dressing materials for patient Home Health Nurse may visit PRN to address patient s wound care needs. FACE TO FACE ENCOUNTER: MEDICARE and MEDICAID PATIENTS: I certify that this patient is under my care and that I had a face-to-face encounter that meets the physician face-to-face encounter requirements with this patient on this date. The encounter with the patient was in whole or in part for the following MEDICAL CONDITION: (primary reason for Home Healthcare) MEDICAL NECESSITY: I certify, that based on my findings, NURSING services are a medically necessary home health service. HOME BOUND STATUS: I certify that my clinical findings support that this patient is homebound (i.e., Due to illness or injury, pt requires aid of supportive devices such as crutches, cane, wheelchairs, walkers, the use of special transportation or the assistance of another person to leave their place of residence. There is a normal inability to leave the home and doing so requires considerable and taxing effort. Other absences are for medical reasons / religious services and are infrequent or of short duration when for other reasons). If current dressing causes regression in wound condition, may D/C ordered dressing product/s and apply Normal Saline Moist Dressing daily until next Wound Healing Center / Other MD appointment. Notify Wound Healing Center of regression in wound condition at [phone]. Please direct any NON-WOUND related issues/requests for orders to patient's Primary Care Physician 1. I am a recommend currently that we go ahead and initiate a [facility] that seems to have done extremely well for her we will continue as such. 2. I am also going to suggest that we continue with appropriate offloading she has been doing this as frequently as needed and I think doing a great job with this. 3. We will also  continue with alternating air mattress that seems to be doing much better for her than the as air mattress was. We will see patient back for reevaluation in 1 week here in the clinic. If anything worsens or changes patient will contact our office for additional recommendations. Electronic Signature(s) Signed: 09/04/2019 3:49:52 PM By: Lenda KelpStone III, Nicolo Tomko PA-C Entered By: Lenda KelpStone III, Tedd Cottrill on 09/04/2019 15:49:52 Dolloff, [name] ([id number]) -------------------------------------------------------------------------------- SuperBill Details Patient Name: [name], Taja M. Date of Service: 09/04/2019 Medical Record Number: [id number] Patient Account Number: [id number] Date of Birth/Sex: 1966/03/06 (54 y.o. F) Treating RN: Rodell PernaScott, Dajea Primary Care Provider: [place], JULIE Other Clinician: Referring Provider: [place], JULIE Treating Provider/Extender: Linwood DibblesSTONE III, Edmond Ginsberg Weeks in Treatment: 21 Diagnosis Coding ICD-10 Codes Code Description L89.313 Pressure ulcer of right buttock, stage 3 L89.323 Pressure ulcer of left buttock, stage 3 L89.213 Pressure ulcer of right hip, stage 3 G82.52 Quadriplegia, C1-C4 incomplete E44.0 Moderate protein-calorie malnutrition Facility Procedures CPT4 Code: [id number] Description: 99213 - WOUND CARE VISIT-LEV 3 EST PT Modifier: Quantity: 1 Physician Procedures CPT4 Code: [id number] Description: 99213 - WC PHYS LEVEL 3 - EST PT Modifier: Quantity: 1 CPT4 Code: Description: ICD-10 Diagnosis Description L89.313 Pressure ulcer of right buttock, stage 3 L89.323 Pressure ulcer of left buttock, stage 3 L89.213 Pressure ulcer of right hip, stage 3 G82.52 Quadriplegia, C1-C4 incomplete Modifier: Quantity: Electronic Signature(s) Signed: 09/05/2019 2:51:08 PM By: Francie MassingKelly, Tia Signed: 09/05/2019 5:00:47 PM By: Lenda KelpStone III, Mclain Freer PA-C Previous Signature: 09/04/2019 3:49:29 PM Version By: Lenda KelpStone III, Neilani Duffee PA-C Entered By: Francie MassingKelly, Tia on 09/05/2019 14:51:07

## 2019-09-08 NOTE — Progress Notes (Addendum)
ARETA, TERWILLIGER ([id number]) Visit Report for 09/04/2019 Arrival Information Details Patient Name: JUEL, RIPLEY. Date of Service: 09/04/2019 2:45 PM Medical Record Number: [id number] Patient Account Number: [id number] Date of Birth/Sex: [birth year] (54 y.o. F) Treating RN: Huel Coventry Primary Care Joelie Schou: [place], JULIE Other Clinician: Referring Alvino Lechuga: [place], [place]ULIE Treating Aleecia Tapia/Extender: Linwood Dibbles, HOYT Weeks in Treatment: 21 Visit Information History Since Last Visit Added or deleted any medications: No Patient Arrived: Wheel Chair Pain Present Now: No Arrival Time: 15:09 Accompanied By: caregiver Transfer Assistance: Manual Patient Identification Verified: Yes Secondary Verification Process Completed: Yes Patient Requires Transmission-Based Precautions: No Patient Has Alerts: No Electronic Signature(s) Signed: 09/04/2019 5:42:49 PM By: Elliot Gurney, BSN, RN, CWS, Kim RN, BSN Entered By: Elliot Gurney, BSN, RN, CWS, Kim on 09/04/2019 15:10:25 Sheetz, [name] ([id number]) -------------------------------------------------------------------------------- Clinic Level of Care Assessment Details Patient Name: TOSHI, ISHII. Date of Service: 09/04/2019 2:45 PM Medical Record Number: [id number] Patient Account Number: [id number] Date of Birth/Sex: [birth year] (54 y.o. F) Treating RN: Rodell Perna Primary Care Tamar Lipscomb: [place], JULIE Other Clinician: Referring Kelby Adell: [place], [place] Treating Oren Barella/Extender: Linwood Dibbles, HOYT Weeks in Treatment: 21 Clinic Level of Care Assessment Items TOOL 4 Quantity Score []  - Use when only an EandM is performed on FOLLOW-UP visit 0 ASSESSMENTS - Nursing Assessment / Reassessment X - Reassessment of Co-morbidities (includes updates in patient status) 1 10 X- 1 5 Reassessment of Adherence to Treatment Plan ASSESSMENTS - Wound and Skin Assessment / Reassessment []  - Simple Wound Assessment / Reassessment - one wound 0 X- 2 5 Complex Wound  Assessment / Reassessment - multiple wounds []  - 0 Dermatologic / Skin Assessment (not related to wound area) ASSESSMENTS - Focused Assessment []  - Circumferential Edema Measurements - multi extremities 0 []  - 0 Nutritional Assessment / Counseling / Intervention []  - 0 Lower Extremity Assessment (monofilament, tuning fork, pulses) []  - 0 Peripheral Arterial Disease Assessment (using hand held doppler) ASSESSMENTS - Ostomy and/or Continence Assessment and Care []  - Incontinence Assessment and Management 0 []  - 0 Ostomy Care Assessment and Management (repouching, etc.) PROCESS - Coordination of Care X - Simple Patient / Family Education for ongoing care 1 15 []  - 0 Complex (extensive) Patient / Family Education for ongoing care []  - 0 Staff obtains , Records, Test Results / Process Orders []  - 0 Staff telephones HHA, Nursing Homes / Clarify orders / etc []  - 0 Routine Transfer to another Facility (non-emergent condition) []  - 0 Routine Hospital Admission (non-emergent condition) []  - 0 New Admissions / / Ordering NPWT, Apligraf, etc. []  - 0 Emergency Hospital Admission (emergent condition) X- 1 10 Simple Discharge Coordination []  - 0 Complex (extensive) Discharge Coordination PROCESS - Special Needs []  - Pediatric / Minor Patient Management 0 []  - 0 Isolation Patient Management []  - 0 Hearing / Language / Visual special needs []  - 0 Assessment of Community assistance (transportation, D/C planning, etc.) Karch, Zorina M. ( ) []  - 0 Additional assistance / Altered mentation []  - 0 Support Surface(s) Assessment (bed, cushion, seat, etc.) INTERVENTIONS - Wound Cleansing / Measurement []  - Simple Wound Cleansing - one wound 0 X- 2 5 Complex Wound Cleansing - multiple wounds X- 1 5 Wound Imaging (photographs - any number of wounds) []  - 0 Wound Tracing (instead of photographs) []  - 0 Simple Wound Measurement - one wound X- 2  5 Complex Wound Measurement - multiple wounds INTERVENTIONS - Wound Dressings []  - Small Wound Dressing one or multiple wounds 0 X- 2 15  Medium Wound Dressing one or multiple wounds []  - 0 Large Wound Dressing one or multiple wounds []  - 0 Application of Medications - topical []  - 0 Application of Medications - injection INTERVENTIONS - Miscellaneous []  - External ear exam 0 []  - 0 Specimen Collection (cultures, biopsies, blood, body fluids, etc.) []  - 0 Specimen(s) / Culture(s) sent or taken to Lab for analysis []  - 0 Patient Transfer (multiple staff / Lift / Similar devices) []  - 0 Simple Staple / Suture removal (25 or less) []  - 0 Complex Staple / Suture removal (26 or more) []  - 0 Hypo / Hyperglycemic Management (close monitor of Blood Glucose) []  - 0 Ankle / Brachial Index (ABI) - do not check if billed separately X- 1 5 Vital Signs Has the patient been seen at the hospital within the last three years: Yes Total Score: 110 Level Of Care: New/Established - Level 3 Electronic Signature(s) Signed: 09/08/2019 8:02:25 AM By: Entered By: on 09/04/2019 15:49:51 Mehlhoff, Lalena M. ( ) -------------------------------------------------------------------------------- Complex / Palliative Patient Assessment Details Patient Name: LEANDRA, VANDERWEELE. Date of Service: 09/04/2019 2:45 PM Medical Record Number: Patient Account Number: Date of Birth/Sex: [birth year] (54 y.o. F) Treating RN: 11/08/2019 Primary Care Xiong Haidar: Rodell Perna, JULIE Other Clinician: Referring Madyx Delfin: Rodell Perna, JULIE Treating Chennel Olivos/Extender: 11/04/2019, HOYT Weeks in Treatment: 21 Palliative Management Criteria Complex Wound Management Criteria Patient has remarkable or complex co-morbidities requiring medications or treatments that extend wound healing times. Examples: o Diabetes mellitus with chronic renal failure or end stage renal disease  requiring dialysis o Advanced or poorly controlled rheumatoid arthritis o Diabetes mellitus and end stage chronic obstructive pulmonary disease o Active cancer with current chemo- or radiation therapy Immobile, needs assistance Care Approach Wound Care Plan: Complex Wound Management Electronic Signature(s) Signed: 09/09/2019 12:40:12 PM By: [name], BSN, RN, CWS, Kim RN, BSN Signed: 09/09/2019 4:47:23 PM By: [id number] PA-C Entered By: [id number], BSN, RN, CWS, Kim on 09/09/2019 12:40:12 Bachtel, 40 (Huel Coventry) -------------------------------------------------------------------------------- Encounter Discharge Information Details Patient Name: SALLYANN, KINNAIRD. Date of Service: 09/04/2019 2:45 PM Medical Record Number: Linwood Dibbles Patient Account Number: 11/09/2019 Date of Birth/Sex: 1966-01-11 (54 y.o. F) Treating RN: Lenda Kelp Primary Care Sabah Zucco: Elliot Gurney, JULIE Other Clinician: Referring Kymora Sciara: 11/09/2019, JULIE Treating Polk Minor/Extender: [name], HOYT Weeks in Treatment: 21 Encounter Discharge Information Items Discharge Condition: Stable Ambulatory Status: Wheelchair Discharge Destination: Home Transportation: Private Auto Accompanied By: family Schedule Follow-up Appointment: Yes Clinical Summary of Care: Electronic Signature(s) Signed: 09/08/2019 8:02:25 AM By: [name] Entered By: 11/04/2019 on 09/04/2019 15:50:46 Zettler, [id number] (09/21/1965) -------------------------------------------------------------------------------- Lower Extremity Assessment Details Patient Name: ROBBIE, NANGLE. Date of Service: 09/04/2019 2:45 PM Medical Record Number: [place] Patient Account Number: [place] Date of Birth/Sex: 11/05/1965 (54 y.o. F) Treating RN: Rodell Perna Primary Care Gracie Gupta: Rodell Perna, JULIE Other Clinician: Referring Coretha Creswell: 11/04/2019, JULIE Treating Glenna Brunkow/Extender: [name] in Treatment: 21 Electronic Signature(s) Signed: 09/04/2019  5:42:49 PM By: [name], BSN, RN, CWS, Kim RN, BSN Entered By: 11/04/2019, BSN, RN, CWS, Kim on 09/04/2019 15:25:02 Pomales, [id number] (09/21/1965) -------------------------------------------------------------------------------- Multi Wound Chart Details Patient Name: NAYELIE, GIONFRIDDO. Date of Service: 09/04/2019 2:45 PM Medical Record Number: [place] Patient Account Number: [place] Date of Birth/Sex: March 23, 1966 (54 y.o. F) Treating RN: Elliot Gurney Primary Care Jaquelyne Firkus: Elliot Gurney, JULIE Other Clinician: Referring Montray Kliebert: 11/04/2019, JULIE Treating Syanne Looney/Extender: STONE III, HOYT Weeks in Treatment: 21 Vital Signs Height(in): 63 Pulse(bpm): 79 Weight(lbs): 70 Blood Pressure(mmHg): 115/67 Body Mass  Index(BMI): 12 Temperature(F): 98.2 Respiratory Rate(breaths/min): 16 Photos: [5:No Photos] [N/A:N/A] Wound Location: Right Sacrum Right Trochanter N/A Wounding Event: Gradually Appeared Gradually Appeared N/A Primary Etiology: Pressure Ulcer Pressure Ulcer N/A Comorbid History: N/A Asthma, Chronic Obstructive N/A Pulmonary Disease (COPD), History of pressure wounds, Paraplegia Date Acquired: 10/16/2018 04/02/2018 N/A Weeks of Treatment: 21 21 N/A Wound Status: Open Open N/A Clustered Wound: Yes No N/A Measurements L x W x D (cm) 7.5x9x0.2 4.5x4.5x0.6 N/A Area (cm) : 53.014 15.904 N/A Volume (cm) : 10.603 9.543 N/A % Reduction in Area: -12.50% -1.20% N/A % Reduction in Volume: 67.90% 32.50% N/A Starting Position 1 (o'clock): 8 Ending Position 1 (o'clock): 4 Maximum Distance 1 (cm): 1.3 Undermining: N/A Yes N/A Classification: Category/Stage III Category/Stage III N/A Exudate Amount: N/A Medium N/A Exudate Type: N/A Serous N/A Exudate Color: N/A amber N/A Wound Margin: N/A Flat and Intact N/A Granulation Amount: N/A Medium (34-66%) N/A Granulation Quality: N/A Red N/A Necrotic Amount: N/A Medium (34-66%) N/A Epithelialization: N/A Small (1-33%) N/A Treatment Notes Electronic  Signature(s) Signed: 09/08/2019 8:02:25 AM By: Rodell Perna Entered By: Rodell Perna on 09/04/2019 15:48:10 Homesley, [name] ([id number]) -------------------------------------------------------------------------------- Multi-Disciplinary Care Plan Details Patient Name: SEYNABOU, FULTS. Date of Service: 09/04/2019 2:45 PM Medical Record Number: [id number] Patient Account Number: [id number] Date of Birth/Sex: 1966-06-18 (54 y.o. F) Treating RN: Rodell Perna Primary Care Hilmer Aliberti: [place], JULIE Other Clinician: Referring Lariza Cothron: [place], [place]IE Treating Charina Fons/Extender: Linwood Dibbles, HOYT Weeks in Treatment: 21 Active Inactive Abuse / Safety / Falls / Self Care Management Nursing Diagnoses: Impaired physical mobility Goals: Patient will not develop complications from immobility Date Initiated: 04/08/2019 Target Resolution Date: 07/12/2019 Goal Status: Active Interventions: Assess fall risk on admission and as needed Notes: Nutrition Nursing Diagnoses: Potential for alteratiion in Nutrition/Potential for imbalanced nutrition Goals: Patient/caregiver agrees to and verbalizes understanding of need to use nutritional supplements and/or vitamins as prescribed Date Initiated: 04/08/2019 Target Resolution Date: 07/12/2019 Goal Status: Active Interventions: Assess patient nutrition upon admission and as needed per policy Notes: Pressure Nursing Diagnoses: Knowledge deficit related to management of pressures ulcers Goals: Patient will remain free of pressure ulcers Date Initiated: 04/08/2019 Target Resolution Date: 07/12/2019 Goal Status: Active Interventions: Assess potential for pressure ulcer upon admission and as needed Notes: Wound/Skin Impairment Nursing Diagnoses: Impaired tissue integrity ANACLARA, ACKLIN ([id number]) Goals: Ulcer/skin breakdown will heal within 14 weeks Date Initiated: 04/08/2019 Target Resolution Date: 07/12/2019 Goal Status: Active Interventions: Assess  patient/caregiver ability to obtain necessary supplies Assess patient/caregiver ability to perform ulcer/skin care regimen upon admission and as needed Assess ulceration(s) every visit Notes: Electronic Signature(s) Signed: 09/08/2019 8:02:25 AM By: Rodell Perna Entered By: Rodell Perna on 09/04/2019 15:48:00 Rosner, Tedra M. ([id number]) -------------------------------------------------------------------------------- Pain Assessment Details Patient Name: [name] Date of Service: 09/04/2019 2:45 PM Medical Record Number: [id number] Patient Account Number: [id number] Date of Birth/Sex: Jan 05, 1966 (54 y.o. F) Treating RN: Huel Coventry Primary Care Taisia Fantini: [place], JULIE Other Clinician: Referring Edis Huish: [place], [place] Treating Eluzer Howdeshell/Extender: Linwood Dibbles, HOYT Weeks in Treatment: 21 Active Problems Location of Pain Severity and Description of Pain Patient Has Paino Yes Site Locations Rate the pain. Current Pain Level: 3 Pain Management and Medication Current Pain Management: Electronic Signature(s) Signed: 09/04/2019 5:42:49 PM By: Elliot Gurney, BSN, RN, CWS, Kim RN, BSN Entered By: Elliot Gurney, BSN, RN, CWS, Kim on 09/04/2019 15:14:22 Figley, [name] ([id number]) -------------------------------------------------------------------------------- Patient/Caregiver Education Details Patient Name: ALAYLAH, HEATHERINGTON. Date of Service: 09/04/2019 2:45 PM Medical Record Number: [id number] Patient Account Number: [id number] Date of Birth/Gender:  May 11, 1966 (54 y.o. F) Treating RN: Army Melia Primary Care Physician: [place], JULIE Other Clinician: Referring Physician: [place], JULIE Treating Physician/Extender: Sharalyn Ink in Treatment: 21 Education Assessment Education Provided To: Patient Education Topics Provided Wound/Skin Impairment: Handouts: Caring for Your Ulcer Methods: Demonstration, Explain/Verbal Responses: State content correctly Electronic Signature(s) Signed:  09/08/2019 8:02:25 AM By: Army Melia Entered By: Army Melia on 09/04/2019 15:50:02 Homewood, [name] ([id number]) -------------------------------------------------------------------------------- Wound Assessment Details Patient Name: Graylon Good. Date of Service: 09/04/2019 2:45 PM Medical Record Number: [id number] Patient Account Number: [id number] Date of Birth/Sex: 1965-08-27 (54 y.o. F) Treating RN: Cornell Barman Primary Care Timm Bonenberger: [place], JULIE Other Clinician: Referring Jaymarie Yeakel: [place], [place]ULIE Treating Timothee Gali/Extender: STONE III, HOYT Weeks in Treatment: 21 Wound Status Wound Number: 5 Primary Etiology: Pressure Ulcer Wound Location: Right Sacrum Wound Status: Open Wounding Event: Gradually Appeared Date Acquired: 10/16/2018 Weeks Of Treatment: 21 Clustered Wound: Yes Wound Measurements Length: (cm) 7.5 Width: (cm) 9 Depth: (cm) 0.2 Area: (cm) 53.014 Volume: (cm) 10.603 % Reduction in Area: -12.5% % Reduction in Volume: 67.9% Wound Description Classification: Category/Stage III Treatment Notes Wound #5 (Right Sacrum) Notes h. blue, ABD tape Electronic Signature(s) Signed: 09/04/2019 5:42:49 PM By: Gretta Cool, BSN, RN, CWS, Kim RN, BSN Entered By: Gretta Cool, BSN, RN, CWS, Kim on 09/04/2019 15:20:56 Son, [name] ([id number]) -------------------------------------------------------------------------------- Wound Assessment Details Patient Name: ELLAH, OTTE. Date of Service: 09/04/2019 2:45 PM Medical Record Number: [id number] Patient Account Number: [id number] Date of Birth/Sex: 1965/10/12 (54 y.o. F) Treating RN: Cornell Barman Primary Care Mancel Lardizabal: [place], JULIE Other Clinician: Referring Shirley Decamp: [place], JULIE Treating Chaz Mcglasson/Extender: STONE III, HOYT Weeks in Treatment: 21 Wound Status Wound Number: 7 Primary Pressure Ulcer Etiology: Wound Location: Right Trochanter Wound Open Wounding Event: Gradually Appeared Status: Date Acquired:  04/02/2018 Comorbid Asthma, Chronic Obstructive Pulmonary Disease (COPD), Weeks Of Treatment: 21 History: History of pressure wounds, Paraplegia Clustered Wound: No Photos Wound Measurements Length: (cm) 4.5 % Reducti Width: (cm) 4.5 % Reducti Depth: (cm) 0.6 Epithelia Area: (cm) 15.904 Undermin Volume: (cm) 9.543 Start Ending Maximu on in Area: -1.2% on in Volume: 32.5% lization: Small (1-33%) ing: Yes ing Position (o'clock): 8 Position (o'clock): 4 m Distance: (cm) 1.3 Wound Description Classification: Category/Stage III Foul Odor Wound Margin: Flat and Intact Slough/Fi Exudate Amount: Medium Exudate Type: Serous Exudate Color: amber After Cleansing: No brino No Wound Bed Granulation Amount: Medium (34-66%) Exposed Structure Granulation Quality: Red Fascia Exposed: No Necrotic Amount: Medium (34-66%) Fat Layer (Subcutaneous Tissue) Exposed: Yes Necrotic Quality: Adherent Slough Tendon Exposed: No Muscle Exposed: No Joint Exposed: No Bone Exposed: No Treatment Notes Wound #7 (Right Trochanter) Notes h. blue, ABD tape NAARAH, BORGERDING ([id number]) Electronic Signature(s) Signed: 09/04/2019 5:42:49 PM By: Gretta Cool, BSN, RN, CWS, Kim RN, BSN Entered By: Gretta Cool, BSN, RN, CWS, Kim on 09/04/2019 15:22:30 Boeh, [name] ([id number]) -------------------------------------------------------------------------------- Vitals Details Patient Name: Graylon Good. Date of Service: 09/04/2019 2:45 PM Medical Record Number: [id number] Patient Account Number: [id number] Date of Birth/Sex: 09/02/1965 (54 y.o. F) Treating RN: Cornell Barman Primary Care Gretchen Weinfeld: [place], JULIE Other Clinician: Referring Jontae Adebayo: [place], [place]ULIE Treating Fauna Neuner/Extender: STONE III, HOYT Weeks in Treatment: 21 Vital Signs Time Taken: 15:13 Temperature (F): 98.2 Height (in): 63 Pulse (bpm): 79 Weight (lbs): 70 Respiratory Rate (breaths/min): 16 Body Mass Index (BMI): 12.4 Blood Pressure  (mmHg): 115/67 Reference Range: 80 - 120 mg / dl Electronic Signature(s) Signed: 09/04/2019 5:42:49 PM By: Gretta Cool, BSN, RN, CWS, Kim RN, BSN Entered By: Gretta Cool, BSN, RN, CWS, Kim on  09/04/2019 15:14:11 

## 2019-09-18 ENCOUNTER — Ambulatory Visit: Payer: Medicare Other | Admitting: Physician Assistant

## 2019-09-25 ENCOUNTER — Encounter: Payer: Medicare Other | Admitting: Physician Assistant

## 2019-09-25 ENCOUNTER — Other Ambulatory Visit: Payer: Self-pay

## 2019-09-25 DIAGNOSIS — L89313 Pressure ulcer of right buttock, stage 3: Secondary | ICD-10-CM | POA: Diagnosis not present

## 2019-09-25 NOTE — Progress Notes (Addendum)
BRADLEE, BRIDGERS ([id number]) Visit Report for 09/25/2019 Chief Complaint Document Details Patient Name: [name], [name] Date of Service: 09/25/2019 1:45 PM Medical Record Number: [id number] Patient Account Number: [id number] Date of Birth/Sex: August 12, 1965 (54 y.o. F) Treating RN: Rodell Perna Primary Care Provider: [place], JULIE Other Clinician: Referring Provider: [place], JULIE Treating Provider/Extender: STONE III, Haneefah Venturini Weeks in Treatment: 24 Information Obtained from: Patient Chief Complaint Bilateral gluteal and right hip pressure ulcers Electronic Signature(s) Signed: 09/25/2019 1:48:19 PM By: Lenda Kelp PA-C Entered By: Lenda Kelp on 09/25/2019 13:48:19 Crittendon, [name] ([id number]) -------------------------------------------------------------------------------- HPI Details Patient Name: [name], [name] Date of Service: 09/25/2019 1:45 PM Medical Record Number: [id number] Patient Account Number: [id number] Date of Birth/Sex: December 08, 1965 (54 y.o. F) Treating RN: Rodell Perna Primary Care Provider: [place], JULIE Other Clinician: Referring Provider: [place], JULIE Treating Provider/Extender: Linwood Dibbles, Raynard Mapps Weeks in Treatment: 24 History of Present Illness HPI Description: 54 year old patient who was been an unreliable historian says she had completely healed in February and most recently her home health noticed that the wound on the right scapular area had reopened. She was in hospital about a month ago with pneumonia at that time a workup was done. We have no notes today but will workup the electronic medical record. on review of her records electronically I do not find any recent hospital MRI in the last 6 months. She had a CT scan of the chest in February 2018 which does not show any osseous lesions. she was admitted to the hospital between May 14 and May 17 for 3 days for mucus plugging and collapse of the left lung. at that time she had a decubitus ulcer of the  right scapular region stage III. her history was noted to have quadriplegia C5-C7 incomplete, neurogenic bladder, nephrostomy catheter and emphysema. patient tells me that she has not smoked for last month and a half but as per her hospital records she was smoking in the middle of May. 01/12/17 patient scapular region appears to be doing fairly well on evaluation today. She is having really no syndicate discomfort and has been tolerating the dressing changes well complication. Readmission: 04/08/2019 patient presents today for reevaluation in our clinic concerning issues she is having with wounds on the bilateral gluteal area as well as on the right trochanter. She has unfortunately noted that things seem to have gotten worse as she was using the Clinitron bed. The patient states it gives off a lot of heat and subsequently has not been good for her skin causing her to breakdown more. She has been trying to get back just to the air mattress but has not been able to get the company to come pick up that bed and bring out the air mattress. Her primary care provider is on this as well trying to get them to do so. Unfortunately her wounds are significantly large and show signs of hyper granulation as well. She has had this kind of issue for quite some time and unfortunately just does not seem to be showing signs of significant improvement with what she has been using currently. No fevers, chills, nausea, vomiting, or diarrhea. She does attempt appropriate offloading and again has been using the bed because that is all she has although she is not happy with it. 04/22/2019 on evaluation today patient appears to be doing about the same with regard to her wounds at this time. Fortunately there does not appear to be any signs of active infection at this point which  is good news. She has been tolerating the dressing changes without complication. Overall I am pleased with that with that being said unfortunately she  does have a little bit of more significant breakdown with regard to the wound bed upon evaluation today. I believe this is secondary to the fact that she has been lying potentially too much in a single position for too long over the past several weeks. I think that potentially if she were to mitigate this to a degree that things may improve somewhat. With that being said she is really not happy with her current bed she is attempted to have them come out and switch this back to an air mattress but unfortunately has not been successful in getting them to come out and make this change. She is very frustrated with that. She still has the Clinitron bed and she much prefers just having the standard air mattress. Patient's primary care provider still working on this she tells me. 05/08/2019 on evaluation today patient unfortunately is not doing as well as I would like to see in regard to her wounds. She actually is measuring a little bit larger pretty much at all wound locations unfortunately and overall I am very upset as well with the fact that she has been trying to get the Clinitron bed removed from her home but is not having any luck as such. We did get the number for the company today in order for [place] to see about calling them and switching this over to an air mattress as this is giving her a lot of trouble she states is really hot and she feels like it is breaking down her skin. This is not good. 05/22/2019 on evaluation today patient appears to be doing well with regard to the wounds she is showing some signs of slight improvement at this point. This is good news. Fortunately there is no evidence of active infection at this time which is also good news. No fevers, chills, nausea, vomiting, or diarrhea. She does tell me that in order to get rid of the Clinitron bed that she has been working on that the representative from the company stated they needed in order for me asked to be getting rid of it and  then what we wanted her to have. Again I will be more than happy to do that we can initiate an air mattress for her. 06/06/2019 on evaluation today patient continues to have issues with her wounds. The good news is we figured out what to be done for the her bad as far as get not moved however she is getting actually ready to move and change residences. For that reason what they are going to do is come in and take the bed from the current residence to get rid of the Clinitron bed and then subsequently move the new bed into her new place of residence. This makes complete sense to me as well and I think that sounds like a good plan. At least we have a plan for what to do as far as getting things taken care of here. 06/24/2019 on evaluation today patient appears to be doing really about the same in some regards although a couple of the wounds are measuring a little bit larger and deeper compared to previous. With that being said I do think that unfortunately her wounds seem to be overall progressing in the wrong direction towards being worse not better. The patient is still having to use the Clinitron bed at  this time I do think that she really needs to switch to just a standard air mattress that is what she wants but she is having to wait until she moves in 2 weeks before she can get the new mattress so she does not have to pay to have the bed moved twice. 07/18/2019 on evaluation today patient appears to actually be doing somewhat better in regard to her wounds in general. I am very pleased in this FLORELLA, MCNEESE. ([id number]) regard. With that being said she still has some hyper granulation unfortunately the Hydrofera Blue just was getting stuck to her and the silver cell has done better. Nonetheless I do believe that we do need to probably treat this with some silver nitrate to try to help with the hypergranulation I discussed this with the patient today. She is in agreement with this  plan. 07/29/2019 on evaluation today patient seems to be making some progress albeit slow regard to her wounds. Fortunately there is no signs of active infection at this time. No fever chills noted. She overall seems to be doing quite well at this point. 08/12/2019 upon evaluation today patient appears to be doing about the same at this point in regard to her wounds. She still has some hyper granulation. I really feel like she would do better with Hydrofera Blue over the alginate itself. With that being said we have not been able to use that for a while as it was sticking but again it has been sometime she was having issues with a different bed at that time as well which may have contributed to some of the issues at that point. Fortunately there is no evidence of active infection at this time. No fevers, chills, nausea, vomiting, or diarrhea. 09/04/2019 upon evaluation today patient actually appears to be doing excellent in regard to her wounds across the board. I am extremely pleased with how things appear today. She has a lot of new epithelization, good granulation, and overall she states that she is doing very well with the current bed. Everything about today seems to be on the up and up. 09/25/2019 upon evaluation today patient appears to be doing excellent in regard to her wounds. She has been tolerating the dressing changes without complication. Fortunately there is no evidence of active infection at this time. No fevers, chills, nausea, vomiting, or diarrhea. Overall she is actually been making great progress since getting off of the air-fluidized mattress I am happy in that regard. 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 For about 4-5 months she's had a decubitus ulcer on the right scapular region. she was lost to follow-up for the last 6 months. she had a couple  of small areas which have opened out in the region of previous scar tissue in the sacral region but these haveall healed now. She does not have any other significant comorbidities. She has a good Roho cushion for her wheelchair and she has a specialized mattress for sleep. She has restarted smoking and smokes about half a pack of cigarettes a day 02/18/2015 -- X-ray of the pelvis -- IMPRESSION:No acute osseous injury of the pelvis. If there is clinical concern regarding osteomyelitis, recommend further evaluation with CT. X-ray of the sacrum and coccyx IMPRESSION:No acute osseous abnormality of the sacrum. If there is further clinical concern regarding osteomyelitis of the sacrum, further evaluation with CT is recommended. =========  Electronic Signature(s) Signed: 09/25/2019 2:16:39 PM By: Lenda Kelp PA-C Entered By: Lenda Kelp on 09/25/2019 14:16:39 Lensing, [name] ([id number]) -------------------------------------------------------------------------------- Physical Exam Details Patient Name: [name], [name] Date of Service: 09/25/2019 1:45 PM Medical Record Number: [id number] Patient Account Number: [id number] Date of Birth/Sex: [birth year] (54 y.o. F) Treating RN: Rodell Perna Primary Care Provider: [place], JULIE Other Clinician: Referring Provider: [place], JULIE Treating Provider/Extender: STONE III, Brentley Landfair Weeks in Treatment: 24 Constitutional Thin and well-hydrated in no acute distress. Respiratory normal breathing without difficulty. Psychiatric this patient is able to make decisions and demonstrates good insight into disease process. Alert and Oriented x 3. pleasant and cooperative. Notes Patient's wound bed currently showed signs of good granulation at this point. Fortunately there is no evidence of infection there was really no significant slough buildup I believe that the [facility] was doing excellent for her. The patient likewise is happy and much happier  with the air mattress. Electronic Signature(s) Signed: 09/25/2019 2:16:57 PM By: Lenda Kelp PA-C Entered By: Lenda Kelp on 09/25/2019 14:16:54 Bekele, [name] ([id number]) -------------------------------------------------------------------------------- Physician Orders Details Patient Name: PERI, KREFT. Date of Service: 09/25/2019 1:45 PM Medical Record Number: [id number] Patient Account Number: [id number] Date of Birth/Sex: 31-Aug-1965 (54 y.o. F) Treating RN: Rodell Perna Primary Care Provider: [place], JULIE Other Clinician: Referring Provider: [place], JULIE Treating Provider/Extender: Linwood Dibbles, Valarie Farace Weeks in Treatment: 39 Verbal / Phone Orders: No Diagnosis Coding ICD-10 Coding Code Description L89.313 Pressure ulcer of right buttock, stage 3 L89.323 Pressure ulcer of left buttock, stage 3 L89.213 Pressure ulcer of right hip, stage 3 G82.52 Quadriplegia, C1-C4 incomplete E44.0 Moderate protein-calorie malnutrition Wound Cleansing Wound #5 Right Sacrum o Clean wound with Normal Saline. o May Shower, gently pat wound dry prior to applying new dressing. Wound #7 Right Trochanter o Clean wound with Normal Saline. o May Shower, gently pat wound dry prior to applying new dressing. Primary Wound Dressing Wound #5 Right Sacrum o Hydrafera Blue Ready Transfer - Please order Hydrofera Blue Ready Transfer specifically for patient r/t wound location and wet with saline or warm soapy water prior to removal Wound #7 Right Trochanter o Hydrafera Blue Ready Transfer - Please order Hydrofera Blue Ready Transfer specifically for patient r/t wound location and wet with saline or warm soapy water prior to removal Secondary Dressing Wound #5 Right Sacrum o ABD pad - Secure with tape Wound #7 Right Trochanter o ABD pad - Secure with tape Dressing Change Frequency Wound #5 Right Sacrum o Change Dressing Monday, Wednesday, Friday - or three times  weekly Wound #7 Right Trochanter o Change Dressing Monday, Wednesday, Friday - or three times weekly Follow-up Appointments Wound #5 Right Sacrum o Return Appointment in 2 weeks. Wound #7 Right Trochanter o Return Appointment in 2 weeks. Off-Loading Wound #5 Right Sacrum o Mattress - air mattress or fluidized air mattress o Turn and reposition every 2 hours SHANTELE, RELLER. ([id number]) Wound #7 Right Trochanter o Mattress - air mattress or fluidized air mattress o Turn and reposition every 2 hours Additional Orders / Instructions Wound #5 Right Sacrum o Increase protein intake. o Increase protein intake. Wound #7 Right Trochanter o Increase protein intake. o Increase protein intake. Home Health Wound #5 Right Sacrum o Continue Home Health Visits - [facility] - Please provide dressing materials for patient o Home Health Nurse may visit PRN to address patientos wound care needs. o FACE TO FACE ENCOUNTER: MEDICARE and MEDICAID PATIENTS: I certify that this patient is  under my care and that I had a face-to- face encounter that meets the physician face-to-face encounter requirements with this patient on this date. The encounter with the patient was in whole or in part for the following MEDICAL CONDITION: (primary reason for Home Healthcare) MEDICAL NECESSITY: I certify, that based on my findings, NURSING services are a medically necessary home health service. HOME BOUND STATUS: I certify that my clinical findings support that this patient is homebound (i.e., Due to illness or injury, pt requires aid of supportive devices such as crutches, cane, wheelchairs, walkers, the use of special transportation or the assistance of another person to leave their place of residence. There is a normal inability to leave the home and doing so requires considerable and taxing effort. Other absences are for medical reasons / religious services and are infrequent or of short  duration when for other reasons). o If current dressing causes regression in wound condition, may D/C ordered dressing product/s and apply Normal Saline Moist Dressing daily until next Wound Healing Center / Other MD appointment. Notify Wound Healing Center of regression in wound condition at [phone]. o Please direct any NON-WOUND related issues/requests for orders to patient's Primary Care Physician Wound #7 Right Trochanter o Continue Home Health Visits - [facility] - Please provide dressing materials for patient o Home Health Nurse may visit PRN to address patientos wound care needs. o FACE TO FACE ENCOUNTER: MEDICARE and MEDICAID PATIENTS: I certify that this patient is under my care and that I had a face-to- face encounter that meets the physician face-to-face encounter requirements with this patient on this date. The encounter with the patient was in whole or in part for the following MEDICAL CONDITION: (primary reason for Home Healthcare) MEDICAL NECESSITY: I certify, that based on my findings, NURSING services are a medically necessary home health service. HOME BOUND STATUS: I certify that my clinical findings support that this patient is homebound (i.e., Due to illness or injury, pt requires aid of supportive devices such as crutches, cane, wheelchairs, walkers, the use of special transportation or the assistance of another person to leave their place of residence. There is a normal inability to leave the home and doing so requires considerable and taxing effort. Other absences are for medical reasons / religious services and are infrequent or of short duration when for other reasons). o If current dressing causes regression in wound condition, may D/C ordered dressing product/s and apply Normal Saline Moist Dressing daily until next Wound Healing Center / Other MD appointment. Notify Wound Healing Center of regression in wound condition at [phone]. o Please direct  any NON-WOUND related issues/requests for orders to patient's Primary Care Physician Electronic Signature(s) Signed: 09/25/2019 3:40:54 PM By: Rodell Perna Signed: 09/25/2019 4:59:35 PM By: Lenda Kelp PA-C Entered By: Rodell Perna on 09/25/2019 14:12:08 Deisher, [name] ([id number]) -------------------------------------------------------------------------------- Problem List Details Patient Name: [name], [name] Date of Service: 09/25/2019 1:45 PM Medical Record Number: [id number] Patient Account Number: [id number] Date of Birth/Sex: 1965/07/14 (54 y.o. F) Treating RN: Rodell Perna Primary Care Provider: [place], JULIE Other Clinician: Referring Provider: [place], JULIE Treating Provider/Extender: Linwood Dibbles, Esme Durkin Weeks in Treatment: 24 Active Problems ICD-10 Evaluated Encounter Code Description Active Date Today Diagnosis L89.313 Pressure ulcer of right buttock, stage 3 04/08/2019 No Yes L89.323 Pressure ulcer of left buttock, stage 3 04/08/2019 No Yes L89.213 Pressure ulcer of right hip, stage 3 04/08/2019 No Yes G82.52 Quadriplegia, C1-C4 incomplete 04/08/2019 No Yes E44.0 Moderate protein-calorie malnutrition 04/08/2019 No Yes Inactive  Problems Resolved Problems Electronic Signature(s) Signed: 09/25/2019 1:48:14 PM By: Lenda Kelp PA-C Entered By: Lenda Kelp on 09/25/2019 13:48:13 Mukai, [name] ([id number]) -------------------------------------------------------------------------------- Progress Note Details Patient Name: [name], [name] Date of Service: 09/25/2019 1:45 PM Medical Record Number: [id number] Patient Account Number: [id number] Date of Birth/Sex: Oct 05, 1965 (54 y.o. F) Treating RN: Rodell Perna Primary Care Provider: [place], JULIE Other Clinician: Referring Provider: [place], JULIE Treating Provider/Extender: Linwood Dibbles, Asako Saliba Weeks in Treatment: 24 Subjective Chief Complaint Information obtained from Patient Bilateral gluteal and right hip  pressure ulcers History of Present Illness (HPI) 54 year old patient who was been an unreliable historian says she had completely healed in February and most recently her home health noticed that the wound on the right scapular area had reopened. She was in hospital about a month ago with pneumonia at that time a workup was done. We have no notes today but will workup the electronic medical record. on review of her records electronically I do not find any recent hospital MRI in the last 6 months. She had a CT scan of the chest in February 2018 which does not show any osseous lesions. she was admitted to the hospital between May 14 and May 17 for 3 days for mucus plugging and collapse of the left lung. at that time she had a decubitus ulcer of the right scapular region stage III. her history was noted to have quadriplegia C5-C7 incomplete, neurogenic bladder, nephrostomy catheter and emphysema. patient tells me that she has not smoked for last month and a half but as per her hospital records she was smoking in the middle of May. 01/12/17 patient scapular region appears to be doing fairly well on evaluation today. She is having really no syndicate discomfort and has been tolerating the dressing changes well complication. Readmission: 04/08/2019 patient presents today for reevaluation in our clinic concerning issues she is having with wounds on the bilateral gluteal area as well as on the right trochanter. She has unfortunately noted that things seem to have gotten worse as she was using the Clinitron bed. The patient states it gives off a lot of heat and subsequently has not been good for her skin causing her to breakdown more. She has been trying to get back just to the air mattress but has not been able to get the company to come pick up that bed and bring out the air mattress. Her primary care provider is on this as well trying to get them to do so. Unfortunately her wounds are significantly large  and show signs of hyper granulation as well. She has had this kind of issue for quite some time and unfortunately just does not seem to be showing signs of significant improvement with what she has been using currently. No fevers, chills, nausea, vomiting, or diarrhea. She does attempt appropriate offloading and again has been using the bed because that is all she has although she is not happy with it. 04/22/2019 on evaluation today patient appears to be doing about the same with regard to her wounds at this time. Fortunately there does not appear to be any signs of active infection at this point which is good news. She has been tolerating the dressing changes without complication. Overall I am pleased with that with that being said unfortunately she does have a little bit of more significant breakdown with regard to the wound bed upon evaluation today. I believe this is secondary to the fact that she has been lying potentially too  much in a single position for too long over the past several weeks. I think that potentially if she were to mitigate this to a degree that things may improve somewhat. With that being said she is really not happy with her current bed she is attempted to have them come out and switch this back to an air mattress but unfortunately has not been successful in getting them to come out and make this change. She is very frustrated with that. She still has the Clinitron bed and she much prefers just having the standard air mattress. Patient's primary care provider still working on this she tells me. 05/08/2019 on evaluation today patient unfortunately is not doing as well as I would like to see in regard to her wounds. She actually is measuring a little bit larger pretty much at all wound locations unfortunately and overall I am very upset as well with the fact that she has been trying to get the Clinitron bed removed from her home but is not having any luck as such. We did get the  number for the company today in order for [place] to see about calling them and switching this over to an air mattress as this is giving her a lot of trouble she states is really hot and she feels like it is breaking down her skin. This is not good. 05/22/2019 on evaluation today patient appears to be doing well with regard to the wounds she is showing some signs of slight improvement at this point. This is good news. Fortunately there is no evidence of active infection at this time which is also good news. No fevers, chills, nausea, vomiting, or diarrhea. She does tell me that in order to get rid of the Clinitron bed that she has been working on that the representative from the company stated they needed in order for me asked to be getting rid of it and then what we wanted her to have. Again I will be more than happy to do that we can initiate an air mattress for her. 06/06/2019 on evaluation today patient continues to have issues with her wounds. The good news is we figured out what to be done for the her bad as far as get not moved however she is getting actually ready to move and change residences. For that reason what they are going to do is come in and take the bed from the current residence to get rid of the Clinitron bed and then subsequently move the new bed into her new place of residence. This makes complete sense to me as well and I think that sounds like a good plan. At least we have a plan for what to do as far as getting things taken care of here. 06/24/2019 on evaluation today patient appears to be doing really about the same in some regards although a couple of the wounds are measuring a little bit larger and deeper compared to previous. With that being said I do think that unfortunately her wounds seem to be overall progressing in the wrong direction towards being worse not better. The patient is still having to use the Clinitron bed at this time I do think that she really needs to  switch KEARSTON, PUTMAN. ([id number]) to just a standard air mattress that is what she wants but she is having to wait until she moves in 2 weeks before she can get the new mattress so she does not have to pay to have the bed moved  twice. 07/18/2019 on evaluation today patient appears to actually be doing somewhat better in regard to her wounds in general. I am very pleased in this regard. With that being said she still has some hyper granulation unfortunately the Hydrofera Blue just was getting stuck to her and the silver cell has done better. Nonetheless I do believe that we do need to probably treat this with some silver nitrate to try to help with the hypergranulation I discussed this with the patient today. She is in agreement with this plan. 07/29/2019 on evaluation today patient seems to be making some progress albeit slow regard to her wounds. Fortunately there is no signs of active infection at this time. No fever chills noted. She overall seems to be doing quite well at this point. 08/12/2019 upon evaluation today patient appears to be doing about the same at this point in regard to her wounds. She still has some hyper granulation. I really feel like she would do better with Hydrofera Blue over the alginate itself. With that being said we have not been able to use that for a while as it was sticking but again it has been sometime she was having issues with a different bed at that time as well which may have contributed to some of the issues at that point. Fortunately there is no evidence of active infection at this time. No fevers, chills, nausea, vomiting, or diarrhea. 09/04/2019 upon evaluation today patient actually appears to be doing excellent in regard to her wounds across the board. I am extremely pleased with how things appear today. She has a lot of new epithelization, good granulation, and overall she states that she is doing very well with the current bed. Everything about today seems  to be on the up and up. 09/25/2019 upon evaluation today patient appears to be doing excellent in regard to her wounds. She has been tolerating the dressing changes without complication. Fortunately there is no evidence of active infection at this time. No fevers, chills, nausea, vomiting, or diarrhea. Overall she is actually been making great progress since getting off of the air-fluidized mattress I am happy in that regard. 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 For about 4-5 months she's had a decubitus ulcer on the right scapular region. she was lost to follow-up for the last 6 months. she had a couple of small areas which have opened out in the region of previous scar tissue in the sacral region but these haveall healed now. She does not have any other significant comorbidities. She has a good Roho cushion for her wheelchair and she has a specialized mattress for sleep. She has restarted smoking and smokes about half a pack of cigarettes a day 02/18/2015 -- X-ray of the pelvis -- IMPRESSION:No acute osseous injury of the pelvis. If there is clinical concern regarding osteomyelitis, recommend further evaluation with CT. X-ray of the sacrum and coccyx IMPRESSION:No acute osseous abnormality of the sacrum. If there is further clinical concern regarding osteomyelitis of the sacrum, further evaluation with CT is recommended. ========= Objective Constitutional Thin and well-hydrated in no acute distress. Vitals Time Taken: 1:40 PM, Height: 63 in, Weight: 70 lbs, BMI: 12.4, Temperature: 98.8 F, Pulse: 72 bpm, Respiratory Rate: 16 breaths/min, Blood Pressure: 112/65 mmHg. Respiratory normal breathing without difficulty. Psychiatric this patient is able to make decisions and demonstrates good insight into disease process. Alert and  Oriented x 3. pleasant and  cooperative. General Notes: Patient's wound bed currently showed signs of good granulation at this point. Fortunately there is no evidence of infection there was really no significant slough buildup I believe that the [facility] was doing excellent for her. The patient likewise is happy and much happier with the air mattress. Integumentary (Hair, Skin) Wound #5 status is Open. Original cause of wound was Gradually Appeared. The wound is located on the Right Sacrum. The wound measures 7cm length x 8cm width x 0.2cm depth; 43.982cm^2 area and 8.796cm^3 volume. There is Fat Layer (Subcutaneous Tissue) Exposed exposed. There is no tunneling or undermining noted. There is a medium amount of serous drainage noted. The wound margin is flat and intact. There is large (67-100%) pink granulation within the wound bed. There is a small (1-33%) amount of necrotic tissue within the wound bed including Adherent Slough. JOHNI, NARINE. ([id number]) Wound #7 status is Open. Original cause of wound was Gradually Appeared. The wound is located on the Right Trochanter. The wound measures 4.8cm length x 3.2cm width x 0.5cm depth; 12.064cm^2 area and 6.032cm^3 volume. There is Fat Layer (Subcutaneous Tissue) Exposed exposed. There is no tunneling or undermining noted. There is a medium amount of serous drainage noted. The wound margin is flat and intact. There is large (67-100%) red granulation within the wound bed. There is a small (1-33%) amount of necrotic tissue within the wound bed including Adherent Slough. 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 May Shower, gently pat wound dry prior to applying new dressing. Wound #7 Right Trochanter: Clean  wound with Normal Saline. May Shower, gently pat wound dry prior to applying new dressing. Primary Wound Dressing: Wound #5 Right Sacrum: Hydrafera Blue Ready Transfer - Please order Hydrofera Blue Ready Transfer specifically for patient r/t wound location and wet with saline or warm soapy water prior to removal Wound #7 Right Trochanter: Hydrafera Blue Ready Transfer - Please order Hydrofera Blue Ready Transfer specifically for patient r/t wound location and wet with saline or warm soapy water prior to removal Secondary Dressing: Wound #5 Right Sacrum: ABD pad - Secure with tape Wound #7 Right Trochanter: ABD pad - Secure with tape Dressing Change Frequency: Wound #5 Right Sacrum: Change Dressing Monday, Wednesday, Friday - or three times weekly Wound #7 Right Trochanter: Change Dressing Monday, Wednesday, Friday - or three times weekly Follow-up Appointments: Wound #5 Right Sacrum: Return Appointment in 2 weeks. Wound #7 Right Trochanter: Return Appointment in 2 weeks. Off-Loading: Wound #5 Right Sacrum: Mattress - air mattress or fluidized air mattress Turn and reposition every 2 hours Wound #7 Right Trochanter: Mattress - air mattress or fluidized air mattress Turn and reposition every 2 hours Additional Orders / Instructions: Wound #5 Right Sacrum: Increase protein intake. Increase protein intake. Wound #7 Right Trochanter: Increase protein intake. Increase protein intake. TWILIA, YAKLIN ([id number]) Home Health: Wound #5 Right Sacrum: Continue Home Health Visits - [facility] - Please provide dressing materials for patient Home Health Nurse may visit PRN to address patient s wound care needs. FACE TO FACE ENCOUNTER: MEDICARE and MEDICAID PATIENTS: I certify that this patient is under my care and that I had a face-to-face encounter that meets the physician face-to-face encounter requirements with this patient on this date.  The encounter with the patient was in whole  or in part for the following MEDICAL CONDITION: (primary reason for Home Healthcare) MEDICAL NECESSITY: I certify, that based on my findings, NURSING services are a medically necessary home health service. HOME BOUND STATUS: I certify that my clinical findings support that this patient is homebound (i.e., Due to illness or injury, pt requires aid of supportive devices such as crutches, cane, wheelchairs, walkers, the use of special transportation or the assistance of another person to leave their place of residence. There is a normal inability to leave the home and doing so requires considerable and taxing effort. Other absences are for medical reasons / religious services and are infrequent or of short duration when for other reasons). If current dressing causes regression in wound condition, may D/C ordered dressing product/s and apply Normal Saline Moist Dressing daily until next Wound Healing Center / Other MD appointment. Notify Wound Healing Center of regression in wound condition at [phone]. Please direct any NON-WOUND related issues/requests for orders to patient's Primary Care Physician Wound #7 Right Trochanter: Continue Home Health Visits - [facility] - Please provide dressing materials for patient Home Health Nurse may visit PRN to address patient s wound care needs. FACE TO FACE ENCOUNTER: MEDICARE and MEDICAID PATIENTS: I certify that this patient is under my care and that I had a face-to-face encounter that meets the physician face-to-face encounter requirements with this patient on this date. The encounter with the patient was in whole or in part for the following MEDICAL CONDITION: (primary reason for Home Healthcare) MEDICAL NECESSITY: I certify, that based on my findings, NURSING services are a medically necessary home health service. HOME BOUND STATUS: I certify that my clinical findings support that this patient is homebound (i.e., Due to illness or injury, pt requires aid of  supportive devices such as crutches, cane, wheelchairs, walkers, the use of special transportation or the assistance of another person to leave their place of residence. There is a normal inability to leave the home and doing so requires considerable and taxing effort. Other absences are for medical reasons / religious services and are infrequent or of short duration when for other reasons). If current dressing causes regression in wound condition, may D/C ordered dressing product/s and apply Normal Saline Moist Dressing daily until next Wound Healing Center / Other MD appointment. Notify Wound Healing Center of regression in wound condition at [phone]. Please direct any NON-WOUND related issues/requests for orders to patient's Primary Care Physician 1. I would recommend currently that we continue with the wound care measures as before with regard to the alternating air mattress I feel like that is still an appropriate way to go. 2. I am also can recommend that we go ahead and continue with the [facility] dressing which seems to be doing an excellent job for her. 3. I am also going to recommend at this point that we continue to secure the dressing with an ABD pad and tape I think that has done the best for her obviously it may need to be changed more quickly sometimes but in general I feel like she is making excellent progress in this regard. We will see patient back for reevaluation in 2 weeks here in the clinic. If anything worsens or changes patient will contact our office for additional recommendations. Electronic Signature(s) Signed: 09/25/2019 2:17:57 PM By: Lenda Kelp PA-C Entered By: Lenda Kelp on 09/25/2019 14:17:57 [name], [name] ([id number]) -------------------------------------------------------------------------------- SuperBill Details Patient Name: [name]  M. Date of Service: 09/25/2019 Medical Record Number: [id number] Patient Account Number:  [id number] Date of Birth/Sex: [birth year] (54 y.o. F) Treating RN: Rodell PernaScott, Dajea Primary Care Provider: [place], JULIE Other Clinician: Referring Provider: [place], JULIE Treating Provider/Extender: Linwood DibblesSTONE III, Denario Bagot Weeks in Treatment: 24 Diagnosis Coding ICD-10 Codes Code Description L89.313 Pressure ulcer of right buttock, stage 3 L89.323 Pressure ulcer of left buttock, stage 3 L89.213 Pressure ulcer of right hip, stage 3 G82.52 Quadriplegia, C1-C4 incomplete E44.0 Moderate protein-calorie malnutrition Facility Procedures CPT4 Code: [id number] Description: 99213 - WOUND CARE VISIT-LEV 3 EST PT Modifier: Quantity: 1 Physician Procedures CPT4 Code: [id number]: [id number] Description: 99213 - WC PHYS LEVEL 3 - EST PT Modifier: Quantity: 1 CPT4 Code: Description: ICD-10 Diagnosis Description L89.313 Pressure ulcer of right buttock, stage 3 L89.323 Pressure ulcer of left buttock, stage 3 L89.213 Pressure ulcer of right hip, stage 3 G82.52 Quadriplegia, C1-C4 incomplete Modifier: Quantity: Electronic Signature(s) Signed: 09/25/2019 2:18:20 PM By: Lenda KelpStone III, Kyal Arts PA-C Entered By: Lenda KelpStone III, Sunjai Levandoski on 09/25/2019 14:18:20

## 2019-09-25 NOTE — Progress Notes (Signed)
BRONWEN, PENDERGRAFT ([id number]) Visit Report for 09/25/2019 Arrival Information Details Patient Name: [name], [name] Date of Service: 09/25/2019 1:45 PM Medical Record Number: [id number] Patient Account Number: [id number] Date of Birth/Sex: 1965-07-23 (54 y.o. F) Treating RN: Rodell Perna Primary Care Malina Geers: [place], [place]ULIE Other Clinician: Referring Sya Nestler: [place], [place] Treating Purity Irmen/Extender: Linwood Dibbles, HOYT Weeks in Treatment: 24 Visit Information History Since Last Visit Added or deleted any medications: No Patient Arrived: Wheel Chair Any new allergies or adverse reactions: No Arrival Time: 13:42 Had a fall or experienced change in No Accompanied By: friend activities of daily living that may affect Transfer Assistance: None risk of falls: Patient Identification Verified: Yes Signs or symptoms of abuse/neglect since last visito No Secondary Verification Process Completed: Yes Hospitalized since last visit: No Patient Requires Transmission-Based Precautions: No Implantable device outside of the clinic excluding No Patient Has Alerts: No cellular tissue based products placed in the center since last visit: Has Dressing in Place as Prescribed: Yes Pain Present Now: Yes Electronic Signature(s) Signed: 09/25/2019 4:15:53 PM By: Dayton Martes RCP, RRT, CHT Entered By: Weyman Rodney, Lucio Edward on 09/25/2019 13:43:19 Easterly, [name] ([id number]) -------------------------------------------------------------------------------- Clinic Level of Care Assessment Details Patient Name: ONEKA, PARADA. Date of Service: 09/25/2019 1:45 PM Medical Record Number: [id number] Patient Account Number: [id number] Date of Birth/Sex: January 24, 1966 (54 y.o. F) Treating RN: Rodell Perna Primary Care Messiah Ahr: [place], JULIE Other Clinician: Referring Edelyn Heidel: [place], [place] Treating Sharia Averitt/Extender: Linwood Dibbles, HOYT Weeks in Treatment: 24 Clinic Level of Care  Assessment Items TOOL 4 Quantity Score []  - Use when only an EandM is performed on FOLLOW-UP visit 0 ASSESSMENTS - Nursing Assessment / Reassessment X - Reassessment of Co-morbidities (includes updates in patient status) 1 10 X- 1 5 Reassessment of Adherence to Treatment Plan ASSESSMENTS - Wound and Skin Assessment / Reassessment []  - Simple Wound Assessment / Reassessment - one wound 0 X- 2 5 Complex Wound Assessment / Reassessment - multiple wounds []  - 0 Dermatologic / Skin Assessment (not related to wound area) ASSESSMENTS - Focused Assessment []  - Circumferential Edema Measurements - multi extremities 0 []  - 0 Nutritional Assessment / Counseling / Intervention []  - 0 Lower Extremity Assessment (monofilament, tuning fork, pulses) []  - 0 Peripheral Arterial Disease Assessment (using hand held doppler) ASSESSMENTS - Ostomy and/or Continence Assessment and Care []  - Incontinence Assessment and Management 0 []  - 0 Ostomy Care Assessment and Management (repouching, etc.) PROCESS - Coordination of Care X - Simple Patient / Family Education for ongoing care 1 15 []  - 0 Complex (extensive) Patient / Family Education for ongoing care []  - 0 Staff obtains , Records, Test Results / Process Orders []  - 0 Staff telephones HHA, Nursing Homes / Clarify orders / etc []  - 0 Routine Transfer to another Facility (non-emergent condition) []  - 0 Routine Hospital Admission (non-emergent condition) []  - 0 New Admissions / / Ordering NPWT, Apligraf, etc. []  - 0 Emergency Hospital Admission (emergent condition) X- 1 10 Simple Discharge Coordination []  - 0 Complex (extensive) Discharge Coordination PROCESS - Special Needs []  - Pediatric / Minor Patient Management 0 []  - 0 Isolation Patient Management []  - 0 Hearing / Language / Visual special needs []  - 0 Assessment of Community assistance (transportation, D/C planning, etc.) Statz, Hikari M.  ( ) []  - 0 Additional assistance / Altered mentation []  - 0 Support Surface(s) Assessment (bed, cushion, seat, etc.) INTERVENTIONS - Wound Cleansing / Measurement []  - Simple Wound Cleansing - one wound 0 X-  2 5 Complex Wound Cleansing - multiple wounds X- 1 5 Wound Imaging (photographs - any number of wounds) []  - 0 Wound Tracing (instead of photographs) []  - 0 Simple Wound Measurement - one wound X- 2 5 Complex Wound Measurement - multiple wounds INTERVENTIONS - Wound Dressings []  - Small Wound Dressing one or multiple wounds 0 X- 2 15 Medium Wound Dressing one or multiple wounds []  - 0 Large Wound Dressing one or multiple wounds []  - 0 Application of Medications - topical []  - 0 Application of Medications - injection INTERVENTIONS - Miscellaneous []  - External ear exam 0 []  - 0 Specimen Collection (cultures, biopsies, blood, body fluids, etc.) []  - 0 Specimen(s) / Culture(s) sent or taken to Lab for analysis []  - 0 Patient Transfer (multiple staff / / Similar devices) []  - 0 Simple Staple / Suture removal (25 or less) []  - 0 Complex Staple / Suture removal (26 or more) []  - 0 Hypo / Hyperglycemic Management (close monitor of Blood Glucose) []  - 0 Ankle / Brachial Index (ABI) - do not check if billed separately X- 1 5 Vital Signs Has the patient been seen at the hospital within the last three years: Yes Total Score: 110 Level Of Care: New/Established - Level 3 Electronic Signature(s) Signed: 09/25/2019 3:40:54 PM By: Entered By: on 09/25/2019 14:12:50 Fickle, ( ) -------------------------------------------------------------------------------- Encounter Discharge Information Details Patient Name: DEANNAH, ROSSI. Date of Service: 09/25/2019 1:45 PM Medical Record Number: Patient Account Number: Nurse, adult Date of Birth/Sex: [birth year] (54 y.o. F) Treating RN: Primary Care  Ravi Tuccillo: , JULIE Other Clinician: Referring Shaft Corigliano: 09/27/2019, JULIE Treating Rosi Secrist/Extender: Rodell Perna, HOYT Weeks in Treatment: 24 Encounter Discharge Information Items Discharge Condition: Stable Ambulatory Status: Wheelchair Discharge Destination: Home Transportation: Private Auto Accompanied By: spouse Schedule Follow-up Appointment: Yes Clinical Summary of Care: Electronic Signature(s) Signed: 09/25/2019 3:40:54 PM By: 09/27/2019 Entered By: [name] on 09/25/2019 14:13:37 Grinage, [name] (09/27/2019) -------------------------------------------------------------------------------- Lower Extremity Assessment Details Patient Name: OMEGA, DURANTE. Date of Service: 09/25/2019 1:45 PM Medical Record Number: 09/21/1965 Patient Account Number: [id number] Date of Birth/Sex: January 07, 1966 (54 y.o. F) Treating RN: [place] Primary Care Harmoney Sienkiewicz: Linwood Dibbles, JULIE Other Clinician: Referring Rooney Gladwin: 09/27/2019, JULIE Treating Meyer Dockery/Extender: Rodell Perna, HOYT Weeks in Treatment: 24 Electronic Signature(s) Signed: 09/25/2019 4:59:02 PM By: 09/27/2019 Entered By: [name] on 09/25/2019 13:47:16 Huck, [name] (09/27/2019) -------------------------------------------------------------------------------- Multi Wound Chart Details Patient Name: CARESSA, SCEARCE. Date of Service: 09/25/2019 1:45 PM Medical Record Number: 09/21/1965 Patient Account Number: [id number] Date of Birth/Sex: [birth year] (54 y.o. F) Treating RN: [place] Primary Care Candas Deemer: Linwood Dibbles, JULIE Other Clinician: Referring Vinessa Macconnell: 09/27/2019, JULIE Treating Loomis Anacker/Extender: STONE III, HOYT Weeks in Treatment: 24 Vital Signs Height(in): 63 Pulse(bpm): 72 Weight(lbs): 70 Blood Pressure(mmHg): 112/65 Body Mass Index(BMI): 12 Temperature(F): 98.8 Respiratory Rate(breaths/min): 16 Photos: [N/A:N/A] Wound Location: Right Sacrum Right Trochanter N/A Wounding Event: Gradually Appeared  Gradually Appeared N/A Primary Etiology: Pressure Ulcer Pressure Ulcer N/A Comorbid History: Asthma, Chronic Obstructive Asthma, Chronic Obstructive N/A Pulmonary Disease (COPD), History Pulmonary Disease (COPD), History of pressure wounds, Paraplegia of pressure wounds, Paraplegia Date Acquired: 10/16/2018 04/02/2018 N/A Weeks of Treatment: 24 24 N/A Wound Status: Open Open N/A Clustered Wound: Yes No N/A Clustered Quantity: 4 N/A N/A Measurements L x W x D (cm) 7x8x0.2 4.8x3.2x0.5 N/A Area (cm) : 43.982 12.064 N/A Volume (cm) : 8.796 6.032 N/A % Reduction in Area: 6.70% 23.20% N/A % Reduction in Volume:  73.30% 57.30% N/A Classification: Category/Stage III Category/Stage III N/A Exudate Amount: Medium Medium N/A Exudate Type: Serous Serous N/A Exudate Color: amber amber N/A Wound Margin: Flat and Intact Flat and Intact N/A Granulation Amount: Large (67-100%) Large (67-100%) N/A Granulation Quality: Pink Red N/A Necrotic Amount: Small (1-33%) Small (1-33%) N/A Exposed Structures: Fat Layer (Subcutaneous Tissue) Fat Layer (Subcutaneous Tissue) N/A Exposed: Yes Exposed: Yes Fascia: No Fascia: No Tendon: No Tendon: No Muscle: No Muscle: No Joint: No Joint: No Bone: No Bone: No Epithelialization: Small (1-33%) Small (1-33%) N/A Treatment Notes Electronic Signature(s) Signed: 09/25/2019 3:40:54 PM By: Maylon Cos ([id number]) Entered By: Rodell Perna on 09/25/2019 14:11:02 Holben, [name] ([id number]) -------------------------------------------------------------------------------- Multi-Disciplinary Care Plan Details Patient Name: PIER, LAUX. Date of Service: 09/25/2019 1:45 PM Medical Record Number: [id number] Patient Account Number: [id number] Date of Birth/Sex: 1966-06-22 (54 y.o. F) Treating RN: Rodell Perna Primary Care Jedediah Noda: [place], JULIE Other Clinician: Referring Gazella Anglin: [place], [place] Treating Rocko Fesperman/Extender: STONE III,  HOYT Weeks in Treatment: 24 Active Inactive Abuse / Safety / Falls / Self Care Management Nursing Diagnoses: Impaired physical mobility Goals: Patient will not develop complications from immobility Date Initiated: 04/08/2019 Target Resolution Date: 07/12/2019 Goal Status: Active Interventions: Assess fall risk on admission and as needed Notes: Nutrition Nursing Diagnoses: Potential for alteratiion in Nutrition/Potential for imbalanced nutrition Goals: Patient/caregiver agrees to and verbalizes understanding of need to use nutritional supplements and/or vitamins as prescribed Date Initiated: 04/08/2019 Target Resolution Date: 07/12/2019 Goal Status: Active Interventions: Assess patient nutrition upon admission and as needed per policy Notes: Pressure Nursing Diagnoses: Knowledge deficit related to management of pressures ulcers Goals: Patient will remain free of pressure ulcers Date Initiated: 04/08/2019 Target Resolution Date: 07/12/2019 Goal Status: Active Interventions: Assess potential for pressure ulcer upon admission and as needed Notes: Wound/Skin Impairment Nursing Diagnoses: Impaired tissue integrity LARUEN, RISSER ([id number]) Goals: Ulcer/skin breakdown will heal within 14 weeks Date Initiated: 04/08/2019 Target Resolution Date: 07/12/2019 Goal Status: Active Interventions: Assess patient/caregiver ability to obtain necessary supplies Assess patient/caregiver ability to perform ulcer/skin care regimen upon admission and as needed Assess ulceration(s) every visit Notes: Electronic Signature(s) Signed: 09/25/2019 3:40:54 PM By: Rodell Perna Entered By: Rodell Perna on 09/25/2019 14:10:46 Zimny, Annalise M. ([id number]) -------------------------------------------------------------------------------- Pain Assessment Details Patient Name: [name] Date of Service: 09/25/2019 1:45 PM Medical Record Number: [id number] Patient Account Number: [id number] Date  of Birth/Sex: Aug 19, 1965 (54 y.o. F) Treating RN: Curtis Sites Primary Care Bobbijo Holst: [place], JULIE Other Clinician: Referring Colton Tassin: [place], [place] Treating Noam Karaffa/Extender: STONE III, HOYT Weeks in Treatment: 24 Active Problems Location of Pain Severity and Description of Pain Patient Has Paino Yes Site Locations Pain Location: Pain in Ulcers With Dressing Change: Yes Duration of the Pain. Constant / Intermittento Intermittent Pain Management and Medication Current Pain Management: Electronic Signature(s) Signed: 09/25/2019 4:59:02 PM By: Curtis Sites Entered By: Curtis Sites on 09/25/2019 13:47:06 Cousin, [name] ([id number]) -------------------------------------------------------------------------------- Patient/Caregiver Education Details Patient Name: [name] Date of Service: 09/25/2019 1:45 PM Medical Record Number: [id number] Patient Account Number: [id number] Date of Birth/Gender: Dec 29, 1965 (54 y.o. F) Treating RN: Rodell Perna Primary Care Physician: [place], JULIE Other Clinician: Referring Physician: [place], JULIE Treating Physician/Extender: Skeet Simmer in Treatment: 24 Education Assessment Education Provided To: Patient Education Topics Provided Wound/Skin Impairment: Handouts: Caring for Your Ulcer Methods: Demonstration, Explain/Verbal Responses: State content correctly Electronic Signature(s) Signed: 09/25/2019 3:40:54 PM By: Rodell Perna Entered By: Rodell Perna on 09/25/2019 14:13:03 Perkinson, Ying M. ([id number]) -------------------------------------------------------------------------------- Wound Assessment  Details Patient Name: AZULA, ZAPPIA. Date of Service: 09/25/2019 1:45 PM Medical Record Number: [id number] Patient Account Number: [id number] Date of Birth/Sex: May 23, 1966 (54 y.o. F) Treating RN: Montey Hora Primary Care Sharona Rovner: [place], JULIE Other Clinician: Referring Aleanna Menge: [place], [place] Treating  Xxavier Noon/Extender: STONE III, HOYT Weeks in Treatment: 24 Wound Status Wound Number: 5 Primary Pressure Ulcer Etiology: Wound Location: Right Sacrum Wound Open Wounding Event: Gradually Appeared Status: Date Acquired: 10/16/2018 Comorbid Asthma, Chronic Obstructive Pulmonary Disease (COPD), Weeks Of Treatment: 24 History: History of pressure wounds, Paraplegia Clustered Wound: Yes Photos Wound Measurements Length: (cm) 7 % Red Width: (cm) 8 % Red Depth: (cm) 0.2 Epith Clustered Quantity: 4 Tunne Area: (cm) 43.982 Unde Volume: (cm) 8.796 uction in Area: 6.7% uction in Volume: 73.3% elialization: Small (1-33%) ling: No rmining: No Wound Description Classification: Category/Stage III Foul Wound Margin: Flat and Intact Sloug Exudate Amount: Medium Exudate Type: Serous Exudate Color: amber Odor After Cleansing: No h/Fibrino Yes Wound Bed Granulation Amount: Large (67-100%) Exposed Structure Granulation Quality: Pink Fascia Exposed: No Necrotic Amount: Small (1-33%) Fat Layer (Subcutaneous Tissue) Exposed: Yes Necrotic Quality: Adherent Slough Tendon Exposed: No Muscle Exposed: No Joint Exposed: No Bone Exposed: No Treatment Notes Wound #5 (Right Sacrum) Notes h blue ABD tape Electronic Signature(s) MARRISA, KIMBER ([id number]) Signed: 09/25/2019 4:59:02 PM By: Montey Hora Entered By: Montey Hora on 09/25/2019 13:57:20 Vanderberg, [name] ([id number]) -------------------------------------------------------------------------------- Wound Assessment Details Patient Name: Graylon Good. Date of Service: 09/25/2019 1:45 PM Medical Record Number: [id number] Patient Account Number: [id number] Date of Birth/Sex: 1965/11/24 (54 y.o. F) Treating RN: Montey Hora Primary Care Sabin Gibeault: [place], JULIE Other Clinician: Referring Kage Willmann: [place], [place]UL[place] Treating Aamirah Salmi/Extender: STONE III, HOYT Weeks in Treatment: 24 Wound Status Wound Number: 7 Primary  Pressure Ulcer Etiology: Wound Location: Right Trochanter Wound Open Wounding Event: Gradually Appeared Status: Date Acquired: 04/02/2018 Comorbid Asthma, Chronic Obstructive Pulmonary Disease (COPD), Weeks Of Treatment: 24 History: History of pressure wounds, Paraplegia Clustered Wound: No Photos Wound Measurements Length: (cm) 4.8 % Redu Width: (cm) 3.2 % Redu Depth: (cm) 0.5 Epithe Area: (cm) 12.064 Tunne Volume: (cm) 6.032 Under ction in Area: 23.2% ction in Volume: 57.3% lialization: Small (1-33%) ling: No mining: No Wound Description Classification: Category/Stage III Foul O Wound Margin: Flat and Intact Slough Exudate Amount: Medium Exudate Type: Serous Exudate Color: amber dor After Cleansing: No /Fibrino No Wound Bed Granulation Amount: Large (67-100%) Exposed Structure Granulation Quality: Red Fascia Exposed: No Necrotic Amount: Small (1-33%) Fat Layer (Subcutaneous Tissue) Exposed: Yes Necrotic Quality: Adherent Slough Tendon Exposed: No Muscle Exposed: No Joint Exposed: No Bone Exposed: No Treatment Notes Wound #7 (Right Trochanter) Notes h blue ABD tape Electronic Signature(s) Signed: 09/25/2019 4:59:02 PM By: Clare Charon ([id number]) Entered By: Montey Hora on 09/25/2019 13:57:49 Brickley, [name] ([id number]) -------------------------------------------------------------------------------- [place] Details Patient Name: Graylon Good. Date of Service: 09/25/2019 1:45 PM Medical Record Number: [id number] Patient Account Number: [id number] Date of Birth/Sex: [birth year] (54 y.o. F) Treating RN: Army Melia Primary Care Katelan Hirt: [place], JULIE Other Clinician: Referring Jolly Bleicher: [place], [place] Treating Tylen Leverich/Extender: STONE III, HOYT Weeks in Treatment: 24 Vital Signs Time Taken: 13:40 Temperature (F): 98.8 Height (in): 63 Pulse (bpm): 72 Weight (lbs): 70 Respiratory Rate (breaths/min): 16 Body Mass Index  (BMI): 12.4 Blood Pressure (mmHg): 112/65 Reference Range: 80 - 120 mg / dl Electronic Signature(s) Signed: 09/25/2019 4:15:53 PM By: Lorine Bears RCP, RRT, CHT Entered By: Lorine Bears on 09/25/2019 13:43:40

## 2019-10-09 ENCOUNTER — Ambulatory Visit: Payer: Medicare Other | Admitting: Physician Assistant

## 2019-10-17 ENCOUNTER — Encounter: Payer: Medicare Other | Attending: Physician Assistant | Admitting: Physician Assistant

## 2019-10-17 ENCOUNTER — Other Ambulatory Visit: Payer: Self-pay

## 2019-10-17 DIAGNOSIS — J449 Chronic obstructive pulmonary disease, unspecified: Secondary | ICD-10-CM | POA: Diagnosis not present

## 2019-10-17 DIAGNOSIS — F17210 Nicotine dependence, cigarettes, uncomplicated: Secondary | ICD-10-CM | POA: Diagnosis not present

## 2019-10-17 DIAGNOSIS — E440 Moderate protein-calorie malnutrition: Secondary | ICD-10-CM | POA: Insufficient documentation

## 2019-10-17 DIAGNOSIS — L89323 Pressure ulcer of left buttock, stage 3: Secondary | ICD-10-CM | POA: Insufficient documentation

## 2019-10-17 DIAGNOSIS — Z681 Body mass index (BMI) 19 or less, adult: Secondary | ICD-10-CM | POA: Diagnosis not present

## 2019-10-17 DIAGNOSIS — G8254 Quadriplegia, C5-C7 incomplete: Secondary | ICD-10-CM | POA: Insufficient documentation

## 2019-10-17 DIAGNOSIS — L89213 Pressure ulcer of right hip, stage 3: Secondary | ICD-10-CM | POA: Diagnosis not present

## 2019-10-17 DIAGNOSIS — N319 Neuromuscular dysfunction of bladder, unspecified: Secondary | ICD-10-CM | POA: Insufficient documentation

## 2019-10-17 DIAGNOSIS — L89313 Pressure ulcer of right buttock, stage 3: Secondary | ICD-10-CM | POA: Diagnosis present

## 2019-10-17 NOTE — Progress Notes (Addendum)
JUNELLE, HASHEMI ([id number]) Visit Report for 10/17/2019 Chief Complaint Document Details Patient Name: [name], NOKES. Date of Service: 10/17/2019 10:30 AM Medical Record Number: [id number] Patient Account Number: [id number] Date of Birth/Sex: Oct 22, 1965 (54 y.o. F) Treating RN: Curtis Sites Primary Care Provider: [place], JULIE Other Clinician: Referring Provider: [place], JULIE Treating Provider/Extender: STONE III, Adabelle Griffiths Weeks in Treatment: 27 Information Obtained from: Patient Chief Complaint Bilateral gluteal and right hip pressure ulcers Electronic Signature(s) Signed: 10/17/2019 11:11:27 AM By: Lenda Kelp PA-C Entered By: Lenda Kelp on 10/17/2019 11:11:26 Schum, [name] ([id number]) -------------------------------------------------------------------------------- HPI Details Patient Name: [name] Date of Service: 10/17/2019 10:30 AM Medical Record Number: [id number] Patient Account Number: [id number] Date of Birth/Sex: Jan 29, 1966 (54 y.o. F) Treating RN: Curtis Sites Primary Care Provider: [place], JULIE Other Clinician: Referring Provider: [place], JULIE Treating Provider/Extender: Linwood Dibbles, Phyillis Dascoli Weeks in Treatment: 27 History of Present Illness HPI Description: 54 year old patient who was been an unreliable historian says she had completely healed in February and most recently her home health noticed that the wound on the right scapular area had reopened. She was in hospital about a month ago with pneumonia at that time a workup was done. We have no notes today but will workup the electronic medical record. on review of her records electronically I do not find any recent hospital MRI in the last 6 months. She had a CT scan of the chest in February 2018 which does not show any osseous lesions. she was admitted to the hospital between May 14 and May 17 for 3 days for mucus plugging and collapse of the left lung. at that time she had a decubitus ulcer  of the right scapular region stage III. her history was noted to have quadriplegia C5-C7 incomplete, neurogenic bladder, nephrostomy catheter and emphysema. patient tells me that she has not smoked for last month and a half but as per her hospital records she was smoking in the middle of May. 01/12/17 patient scapular region appears to be doing fairly well on evaluation today. She is having really no syndicate discomfort and has been tolerating the dressing changes well complication. Readmission: 04/08/2019 patient presents today for reevaluation in our clinic concerning issues she is having with wounds on the bilateral gluteal area as well as on the right trochanter. She has unfortunately noted that things seem to have gotten worse as she was using the Clinitron bed. The patient states it gives off a lot of heat and subsequently has not been good for her skin causing her to breakdown more. She has been trying to get back just to the air mattress but has not been able to get the company to come pick up that bed and bring out the air mattress. Her primary care provider is on this as well trying to get them to do so. Unfortunately her wounds are significantly large and show signs of hyper granulation as well. She has had this kind of issue for quite some time and unfortunately just does not seem to be showing signs of significant improvement with what she has been using currently. No fevers, chills, nausea, vomiting, or diarrhea. She does attempt appropriate offloading and again has been using the bed because that is all she has although she is not happy with it. 04/22/2019 on evaluation today patient appears to be doing about the same with regard to her wounds at this time. Fortunately there does not appear to be any signs of active infection at this point which  is good news. She has been tolerating the dressing changes without complication. Overall I am pleased with that with that being said  unfortunately she does have a little bit of more significant breakdown with regard to the wound bed upon evaluation today. I believe this is secondary to the fact that she has been lying potentially too much in a single position for too long over the past several weeks. I think that potentially if she were to mitigate this to a degree that things may improve somewhat. With that being said she is really not happy with her current bed she is attempted to have them come out and switch this back to an air mattress but unfortunately has not been successful in getting them to come out and make this change. She is very frustrated with that. She still has the Clinitron bed and she much prefers just having the standard air mattress. Patient's primary care provider still working on this she tells me. 05/08/2019 on evaluation today patient unfortunately is not doing as well as I would like to see in regard to her wounds. She actually is measuring a little bit larger pretty much at all wound locations unfortunately and overall I am very upset as well with the fact that she has been trying to get the Clinitron bed removed from her home but is not having any luck as such. We did get the number for the company today in order for [place] to see about calling them and switching this over to an air mattress as this is giving her a lot of trouble she states is really hot and she feels like it is breaking down her skin. This is not good. 05/22/2019 on evaluation today patient appears to be doing well with regard to the wounds she is showing some signs of slight improvement at this point. This is good news. Fortunately there is no evidence of active infection at this time which is also good news. No fevers, chills, nausea, vomiting, or diarrhea. She does tell me that in order to get rid of the Clinitron bed that she has been working on that the representative from the company stated they needed in order for me asked to be  getting rid of it and then what we wanted her to have. Again I will be more than happy to do that we can initiate an air mattress for her. 06/06/2019 on evaluation today patient continues to have issues with her wounds. The good news is we figured out what to be done for the her bad as far as get not moved however she is getting actually ready to move and change residences. For that reason what they are going to do is come in and take the bed from the current residence to get rid of the Clinitron bed and then subsequently move the new bed into her new place of residence. This makes complete sense to me as well and I think that sounds like a good plan. At least we have a plan for what to do as far as getting things taken care of here. 06/24/2019 on evaluation today patient appears to be doing really about the same in some regards although a couple of the wounds are measuring a little bit larger and deeper compared to previous. With that being said I do think that unfortunately her wounds seem to be overall progressing in the wrong direction towards being worse not better. The patient is still having to use the Clinitron bed at  this time I do think that she really needs to switch to just a standard air mattress that is what she wants but she is having to wait until she moves in 2 weeks before she can get the new mattress so she does not have to pay to have the bed moved twice. 07/18/2019 on evaluation today patient appears to actually be doing somewhat better in regard to her wounds in general. I am very pleased in this LLOYD, CULLINAN. ([id number]) regard. With that being said she still has some hyper granulation unfortunately the Hydrofera Blue just was getting stuck to her and the silver cell has done better. Nonetheless I do believe that we do need to probably treat this with some silver nitrate to try to help with the hypergranulation I discussed this with the patient today. She is in agreement  with this plan. 07/29/2019 on evaluation today patient seems to be making some progress albeit slow regard to her wounds. Fortunately there is no signs of active infection at this time. No fever chills noted. She overall seems to be doing quite well at this point. 08/12/2019 upon evaluation today patient appears to be doing about the same at this point in regard to her wounds. She still has some hyper granulation. I really feel like she would do better with Hydrofera Blue over the alginate itself. With that being said we have not been able to use that for a while as it was sticking but again it has been sometime she was having issues with a different bed at that time as well which may have contributed to some of the issues at that point. Fortunately there is no evidence of active infection at this time. No fevers, chills, nausea, vomiting, or diarrhea. 09/04/2019 upon evaluation today patient actually appears to be doing excellent in regard to her wounds across the board. I am extremely pleased with how things appear today. She has a lot of new epithelization, good granulation, and overall she states that she is doing very well with the current bed. Everything about today seems to be on the up and up. 09/25/2019 upon evaluation today patient appears to be doing excellent in regard to her wounds. She has been tolerating the dressing changes without complication. Fortunately there is no evidence of active infection at this time. No fevers, chills, nausea, vomiting, or diarrhea. Overall she is actually been making great progress since getting off of the air-fluidized mattress I am happy in that regard. 10/17/2019 upon evaluation today patient appears to be doing well at this point in regard to her wounds. She is making good progress at this time which is good news. Fortunately there is no signs of active infection at this time. No fevers, chills, nausea, vomiting, or diarrhea. ======= Old notes 54 year old  patient was had quadriparesis for the last 9 years and has had previous extensive surgery and reconstruction at [facility]. For about 4-5 months she's had a decubitus ulcer on the right scapular region. she was lost to follow-up for the last 6 months. she had a couple of small areas which have opened out in the region of previous scar tissue in the sacral region but these haveall healed now. She does not have any other significant comorbidities. She has a good Roho cushion for her wheelchair and she has a specialized mattress for sleep. She has restarted smoking and smokes about half a pack of cigarettes a day 02/18/2015 -- X-ray of the pelvis -- IMPRESSION:No acute  osseous injury of the pelvis. If there is clinical concern regarding osteomyelitis, recommend further evaluation with CT. X-ray of the sacrum and coccyx IMPRESSION:No acute osseous abnormality of the sacrum. If there is further clinical concern regarding osteomyelitis of the sacrum, further evaluation with CT is recommended. ========= Electronic Signature(s) Signed: 10/17/2019 1:49:45 PM By: Lenda Kelp PA-C Entered By: Lenda Kelp on 10/17/2019 13:49:45 Mingle, [name] ([id number]) -------------------------------------------------------------------------------- Physical Exam Details Patient Name: [name], [name] Date of Service: 10/17/2019 10:30 AM Medical Record Number: [id number] Patient Account Number: [id number] Date of Birth/Sex: October 26, 1965 (54 y.o. F) Treating RN: Curtis Sites Primary Care Provider: [place], JULIE Other Clinician: Referring Provider: [place], JULIE Treating Provider/Extender: STONE III, Milos Milligan Weeks in Treatment: 45 Constitutional Well-nourished and well-hydrated in no acute distress. Respiratory normal breathing without difficulty. Psychiatric this patient is able to make decisions and demonstrates good insight into disease process. Alert and Oriented x 3. pleasant and  cooperative. Notes Patient's wound bed currently showed signs of excellent granulation at patient's point. There does not appear to be any evidence of active infection and overall very pleased with how things seem to be progressing. At this time I feel like she is definitely headed in the right direction. Electronic Signature(s) Signed: 10/17/2019 1:50:09 PM By: Lenda Kelp PA-C Entered By: Lenda Kelp on 10/17/2019 13:50:09 Kohn, [name] ([id number]) -------------------------------------------------------------------------------- Physician Orders Details Patient Name: LAKETA, SANDOZ. Date of Service: 10/17/2019 10:30 AM Medical Record Number: [id number] Patient Account Number: [id number] Date of Birth/Sex: Jul 20, 1965 (54 y.o. F) Treating RN: Curtis Sites Primary Care Provider: [place], JULIE Other Clinician: Referring Provider: [place], JULIE Treating Provider/Extender: Linwood Dibbles, Chynna Buerkle Weeks in Treatment: 26 Verbal / Phone Orders: No Diagnosis Coding ICD-10 Coding Code Description L89.313 Pressure ulcer of right buttock, stage 3 L89.323 Pressure ulcer of left buttock, stage 3 L89.213 Pressure ulcer of right hip, stage 3 G82.52 Quadriplegia, C1-C4 incomplete E44.0 Moderate protein-calorie malnutrition Wound Cleansing Wound #5 Right Sacrum o Clean wound with Normal Saline. o May Shower, gently pat wound dry prior to applying new dressing. Wound #7 Right Trochanter o Clean wound with Normal Saline. o May Shower, gently pat wound dry prior to applying new dressing. Primary Wound Dressing Wound #5 Right Sacrum o Hydrafera Blue Ready Transfer - Please order Hydrofera Blue Ready Transfer specifically for patient r/t wound location and wet with saline or warm soapy water prior to removal Wound #7 Right Trochanter o Hydrafera Blue Ready Transfer - Please order Hydrofera Blue Ready Transfer specifically for patient r/t wound location and wet with saline or warm  soapy water prior to removal Secondary Dressing Wound #5 Right Sacrum o ABD pad - Secure with tape or bordered foam dressing Wound #7 Right Trochanter o ABD pad - Secure with tape or bordered foam dressing Dressing Change Frequency Wound #5 Right Sacrum o Change Dressing Monday, Wednesday, Friday - or three times weekly Wound #7 Right Trochanter o Change Dressing Monday, Wednesday, Friday - or three times weekly Follow-up Appointments Wound #5 Right Sacrum o Return Appointment in 2 weeks. Wound #7 Right Trochanter o Return Appointment in 2 weeks. Off-Loading Wound #5 Right Sacrum o Mattress - air mattress or fluidized air mattress o Turn and reposition every 2 hours NIURKA, BENECKE. ([id number]) Wound #7 Right Trochanter o Mattress - air mattress or fluidized air mattress o Turn and reposition every 2 hours Additional Orders / Instructions Wound #5 Right Sacrum o Increase protein intake. o Increase protein intake. Wound #7 Right Trochanter o  Increase protein intake. o Increase protein intake. Home Health Wound #5 Right Sacrum o Continue Home Health Visits - [facility] - Please provide dressing materials for patient o Home Health Nurse may visit PRN to address patientos wound care needs. o FACE TO FACE ENCOUNTER: MEDICARE and MEDICAID PATIENTS: I certify that this patient is under my care and that I had a face-to- face encounter that meets the physician face-to-face encounter requirements with this patient on this date. The encounter with the patient was in whole or in part for the following MEDICAL CONDITION: (primary reason for Home Healthcare) MEDICAL NECESSITY: I certify, that based on my findings, NURSING services are a medically necessary home health service. HOME BOUND STATUS: I certify that my clinical findings support that this patient is homebound (i.e., Due to illness or injury, pt requires aid of supportive devices such as crutches,  cane, wheelchairs, walkers, the use of special transportation or the assistance of another person to leave their place of residence. There is a normal inability to leave the home and doing so requires considerable and taxing effort. Other absences are for medical reasons / religious services and are infrequent or of short duration when for other reasons). o If current dressing causes regression in wound condition, may D/C ordered dressing product/s and apply Normal Saline Moist Dressing daily until next Wound Healing Center / Other MD appointment. Notify Wound Healing Center of regression in wound condition at [phone]. o Please direct any NON-WOUND related issues/requests for orders to patient's Primary Care Physician Wound #7 Right Trochanter o Continue Home Health Visits - [facility] - Please provide dressing materials for patient o Home Health Nurse may visit PRN to address patientos wound care needs. o FACE TO FACE ENCOUNTER: MEDICARE and MEDICAID PATIENTS: I certify that this patient is under my care and that I had a face-to- face encounter that meets the physician face-to-face encounter requirements with this patient on this date. The encounter with the patient was in whole or in part for the following MEDICAL CONDITION: (primary reason for Home Healthcare) MEDICAL NECESSITY: I certify, that based on my findings, NURSING services are a medically necessary home health service. HOME BOUND STATUS: I certify that my clinical findings support that this patient is homebound (i.e., Due to illness or injury, pt requires aid of supportive devices such as crutches, cane, wheelchairs, walkers, the use of special transportation or the assistance of another person to leave their place of residence. There is a normal inability to leave the home and doing so requires considerable and taxing effort. Other absences are for medical reasons / religious services and are infrequent or of short  duration when for other reasons). o If current dressing causes regression in wound condition, may D/C ordered dressing product/s and apply Normal Saline Moist Dressing daily until next Wound Healing Center / Other MD appointment. Notify Wound Healing Center of regression in wound condition at [phone]. o Please direct any NON-WOUND related issues/requests for orders to patient's Primary Care Physician Electronic Signature(s) Signed: 10/17/2019 4:33:37 PM By: Curtis Sites Signed: 10/19/2019 5:53:18 PM By: Lenda Kelp PA-C Entered By: Curtis Sites on 10/17/2019 11:28:23 P[place]ask, [name] ([id number]) -------------------------------------------------------------------------------- Problem List Details Patient Name: LYLIANA, DICENSO. Date of Service: 10/17/2019 10:30 AM Medical Record Number: [id number] Patient Account Number: [id number] Date of Birth/Sex: December 16, 1965 (54 y.o. F) Treating RN: Curtis Sites Primary Care Provider: [place], JULIE Other Clinician: Referring Provider: [place], JULIE Treating Provider/Extender: STONE III, Candita Borenstein Weeks in Treatment: 27 Active Problems ICD-10 Evaluated  Encounter Code Description Active Date Today Diagnosis L89.313 Pressure ulcer of right buttock, stage 3 04/08/2019 No Yes L89.323 Pressure ulcer of left buttock, stage 3 04/08/2019 No Yes L89.213 Pressure ulcer of right hip, stage 3 04/08/2019 No Yes G82.52 Quadriplegia, C1-C4 incomplete 04/08/2019 No Yes E44.0 Moderate protein-calorie malnutrition 04/08/2019 No Yes Inactive Problems Resolved Problems Electronic Signature(s) Signed: 10/17/2019 11:11:20 AM By: Worthy Keeler PA-C Entered By: Worthy Keeler on 10/17/2019 11:11:19 File, [name] ([id number]) -------------------------------------------------------------------------------- Progress Note Details Patient Name: Graylon Good. Date of Service: 10/17/2019 10:30 AM Medical Record Number: [id number] Patient Account Number:  [id number] Date of Birth/Sex: 11-07-1965 (54 y.o. F) Treating RN: Montey Hora Primary Care Provider: [place], JULIE Other Clinician: Referring Provider: [place], JULIE Treating Provider/Extender: Melburn Hake, Loraine Bhullar Weeks in Treatment: 27 Subjective Chief Complaint Information obtained from Patient Bilateral gluteal and right hip pressure ulcers History of Present Illness (HPI) 54 year old patient who was been an unreliable historian says she had completely healed in February and most recently her home health noticed that the wound on the right scapular area had reopened. She was in hospital about a month ago with pneumonia at that time a workup was done. We have no notes today but will workup the electronic medical record. on review of her records electronically I do not find any recent hospital MRI in the last 6 months. She had a CT scan of the chest in February 2018 which does not show any osseous lesions. she was admitted to the hospital between May 14 and May 17 for 3 days for mucus plugging and collapse of the left lung. at that time she had a decubitus ulcer of the right scapular region stage III. her history was noted to have quadriplegia C5-C7 incomplete, neurogenic bladder, nephrostomy catheter and emphysema. patient tells me that she has not smoked for last month and a half but as per her hospital records she was smoking in the middle of May. 01/12/17 patient scapular region appears to be doing fairly well on evaluation today. She is having really no syndicate discomfort and has been tolerating the dressing changes well complication. Readmission: 04/08/2019 patient presents today for reevaluation in our clinic concerning issues she is having with wounds on the bilateral gluteal area as well as on the right trochanter. She has unfortunately noted that things seem to have gotten worse as she was using the Clinitron bed. The patient states it gives off a lot of heat and subsequently has not  been good for her skin causing her to breakdown more. She has been trying to get back just to the air mattress but has not been able to get the company to come pick up that bed and bring out the air mattress. Her primary care provider is on this as well trying to get them to do so. Unfortunately her wounds are significantly large and show signs of hyper granulation as well. She has had this kind of issue for quite some time and unfortunately just does not seem to be showing signs of significant improvement with what she has been using currently. No fevers, chills, nausea, vomiting, or diarrhea. She does attempt appropriate offloading and again has been using the bed because that is all she has although she is not happy with it. 04/22/2019 on evaluation today patient appears to be doing about the same with regard to her wounds at this time. Fortunately there does not appear to be any signs of active infection at this point which is good  news. She has been tolerating the dressing changes without complication. Overall I am pleased with that with that being said unfortunately she does have a little bit of more significant breakdown with regard to the wound bed upon evaluation today. I believe this is secondary to the fact that she has been lying potentially too much in a single position for too long over the past several weeks. I think that potentially if she were to mitigate this to a degree that things may improve somewhat. With that being said she is really not happy with her current bed she is attempted to have them come out and switch this back to an air mattress but unfortunately has not been successful in getting them to come out and make this change. She is very frustrated with that. She still has the Clinitron bed and she much prefers just having the standard air mattress. Patient's primary care provider still working on this she tells me. 05/08/2019 on evaluation today patient unfortunately is not  doing as well as I would like to see in regard to her wounds. She actually is measuring a little bit larger pretty much at all wound locations unfortunately and overall I am very upset as well with the fact that she has been trying to get the Clinitron bed removed from her home but is not having any luck as such. We did get the number for the company today in order for us to see about calling them and switching this over to an air mattress as this is giving her a lot of trouble she states is really hot and she feels like it is breaking down her skin. This is not good. 05/22/2019 on evaluation today patient appears to be doing well with regard to the wounds she is showing some signs of slight improvement at this point. This is good news. Fortunately there is no evidence of active infection at this time which is also good news. No fevers, chills, nausea, vomiting, or diarrhea. She does tell me that in order to get rid of the Clinitron bed that she has been working on that the representative from the company stated they needed in order for me asked to be getting rid of it and then what we wanted her to have. Again I will be more than happy to do that we can initiate an air mattress for her. 06/06/2019 on evaluation today patient continues to have issues with her wounds. The good news is we figured out what to be done for the her bad as far as get not moved however she is getting actually ready to move and change residences. For that reason what they are going to do is come in and take the bed from the current residence to get rid of the Clinitron bed and then subsequently move the new bed into her new place of residence. This makes complete sense to me as well and I think that sounds like a good plan. At least we have a plan for what to do as far as getting things taken care of here. 06/24/2019 on evaluation today patient appears to be doing really about the same in some regards although a couple of the  wounds are measuring a little bit larger and deeper compared to previous. With that being said I do think that unfortunately her wounds seem to be overall progressing in the wrong direction towards being worse not better. The patient is still having to use the Clinitron bed at this time  I do think that she really needs to switch [name], [name] ([id number]) to just a standard air mattress that is what she wants but she is having to wait until she moves in 2 weeks before she can get the new mattress so she does not have to pay to have the bed moved twice. 07/18/2019 on evaluation today patient appears to actually be doing somewhat better in regard to her wounds in general. I am very pleased in this regard. With that being said she still has some hyper granulation unfortunately the Hydrofera Blue just was getting stuck to her and the silver cell has done better. Nonetheless I do believe that we do need to probably treat this with some silver nitrate to try to help with the hypergranulation I discussed this with the patient today. She is in agreement with this plan. 07/29/2019 on evaluation today patient seems to be making some progress albeit slow regard to her wounds. Fortunately there is no signs of active infection at this time. No fever chills noted. She overall seems to be doing quite well at this point. 08/12/2019 upon evaluation today patient appears to be doing about the same at this point in regard to her wounds. She still has some hyper granulation. I really feel like she would do better with Hydrofera Blue over the alginate itself. With that being said we have not been able to use that for a while as it was sticking but again it has been sometime she was having issues with a different bed at that time as well which may have contributed to some of the issues at that point. Fortunately there is no evidence of active infection at this time. No fevers, chills, nausea, vomiting, or  diarrhea. 09/04/2019 upon evaluation today patient actually appears to be doing excellent in regard to her wounds across the board. I am extremely pleased with how things appear today. She has a lot of new epithelization, good granulation, and overall she states that she is doing very well with the current bed. Everything about today seems to be on the up and up. 09/25/2019 upon evaluation today patient appears to be doing excellent in regard to her wounds. She has been tolerating the dressing changes without complication. Fortunately there is no evidence of active infection at this time. No fevers, chills, nausea, vomiting, or diarrhea. Overall she is actually been making great progress since getting off of the air-fluidized mattress I am happy in that regard. 10/17/2019 upon evaluation today patient appears to be doing well at this point in regard to her wounds. She is making good progress at this time which is good news. Fortunately there is no signs of active infection at this time. No fevers, chills, nausea, vomiting, or diarrhea. 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 For about 4-5 months she's had a decubitus ulcer on the right scapular region. she was lost to follow-up for the last 6 months. she had a couple of small areas which have opened out in the region of previous scar tissue in the sacral region but these haveall healed now. She does not have any other significant comorbidities. She has a good Roho cushion for her wheelchair and she has a specialized mattress for sleep. She has restarted smoking and smokes about half a pack of cigarettes a day 02/18/2015 -- X-ray of the pelvis -- IMPRESSION:No acute osseous injury of  the pelvis. If there is clinical concern regarding osteomyelitis, recommend further evaluation with CT. X-ray of the sacrum and coccyx  IMPRESSION:No acute osseous abnormality of the sacrum. If there is further clinical concern regarding osteomyelitis of the sacrum, further evaluation with CT is recommended. ========= Objective Constitutional Well-nourished and well-hydrated in no acute distress. Vitals Time Taken: 11:04 AM, Height: 63 in, Weight: 70 lbs, BMI: 12.4, Temperature: 98.2 F, Pulse: 70 bpm, Respiratory Rate: 16 breaths/min, Blood Pressure: 136/72 mmHg. Respiratory normal breathing without difficulty. Psychiatric this patient is able to make decisions and demonstrates good insight into disease process. Alert and Oriented x 3. pleasant and cooperative. General Notes: Patient's wound bed currently showed signs of excellent granulation at patient's point. There does not appear to be any evidence of active infection and overall very pleased with how things seem to be progressing. At this time I feel like she is definitely headed in the right direction. Integumentary (Hair, Skin) Wound #5 status is Open. Original cause of wound was Gradually Appeared. The wound is located on the Right Sacrum. The wound measures 7.5cm length x 6cm width x 0.1cm depth; 35.343cm^2 area and 3.534cm^3 volume. There is Fat Layer (Subcutaneous Tissue) Exposed exposed. There is a [name], [name] ([id number]) medium amount of serous drainage noted. The wound margin is flat and intact. There is large (67-100%) pink granulation within the wound bed. There is a small (1-33%) amount of necrotic tissue within the wound bed including Adherent Slough. Wound #7 status is Open. Original cause of wound was Gradually Appeared. The wound is located on the Right Trochanter. The wound measures 5.6cm length x 2.5cm width x 0.3cm depth; 10.996cm^2 area and 3.299cm^3 volume. There is Fat Layer (Subcutaneous Tissue) Exposed exposed. There is undermining starting at 11:00 and ending at 2:00 with a maximum distance of 0.6cm. There is additional undermining and at  7:00 and ending at 8:00 with a maximum distance of 0.4cm. There is a medium amount of serous drainage noted. The wound margin is flat and intact. There is large (67-100%) red granulation within the wound bed. There is a small (1-33%) amount of necrotic tissue within the wound bed including Adherent Slough. 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 May Shower, gently pat wound dry prior to applying new dressing. Wound #7 Right Trochanter: Clean wound with Normal Saline. May Shower, gently pat wound dry prior to applying new dressing. Primary Wound Dressing: Wound #5 Right Sacrum: Hydrafera Blue Ready Transfer - Please order Hydrofera Blue Ready Transfer specifically for patient r/t wound location and wet with saline or warm soapy water prior to removal Wound #7 Right Trochanter: Hydrafera Blue Ready Transfer - Please order Hydrofera Blue Ready Transfer specifically for patient r/t wound location and wet with saline or warm soapy water prior to removal Secondary Dressing: Wound #5 Right Sacrum: ABD pad - Secure with tape or bordered foam dressing Wound #7 Right Trochanter: ABD pad - Secure with tape or bordered foam dressing Dressing Change Frequency: Wound #5 Right Sacrum: Change Dressing Monday, Wednesday, Friday - or three times weekly Wound #7 Right Trochanter: Change Dressing Monday, Wednesday, Friday - or three times weekly Follow-up Appointments: Wound #5 Right Sacrum: Return Appointment in 2 weeks. Wound #7 Right Trochanter: Return Appointment in 2 weeks. Off-Loading: Wound #5 Right Sacrum:  Mattress - air mattress or fluidized air mattress Turn and reposition every 2 hours Wound #7 Right Trochanter: Mattress - air  mattress or fluidized air mattress Turn and reposition every 2 hours Additional Orders / Instructions: Wound #5 Right Sacrum: Increase protein intake. Increase protein intake. [name], [name] ([id number]) Wound #7 Right Trochanter: Increase protein intake. Increase protein intake. Home Health: Wound #5 Right Sacrum: Continue Home Health Visits - [facility] - Please provide dressing materials for patient Home Health Nurse may visit PRN to address patient s wound care needs. FACE TO FACE ENCOUNTER: MEDICARE and MEDICAID PATIENTS: I certify that this patient is under my care and that I had a face-to-face encounter that meets the physician face-to-face encounter requirements with this patient on this date. The encounter with the patient was in whole or in part for the following MEDICAL CONDITION: (primary reason for Home Healthcare) MEDICAL NECESSITY: I certify, that based on my findings, NURSING services are a medically necessary home health service. HOME BOUND STATUS: I certify that my clinical findings support that this patient is homebound (i.e., Due to illness or injury, pt requires aid of supportive devices such as crutches, cane, wheelchairs, walkers, the use of special transportation or the assistance of another person to leave their place of residence. There is a normal inability to leave the home and doing so requires considerable and taxing effort. Other absences are for medical reasons / religious services and are infrequent or of short duration when for other reasons). If current dressing causes regression in wound condition, may D/C ordered dressing product/s and apply Normal Saline Moist Dressing daily until next Wound Healing Center / Other MD appointment. Notify Wound Healing Center of regression in wound condition at [phone]. Please direct any NON-WOUND related issues/requests for orders to patient's Primary Care Physician Wound #7 Right Trochanter: Continue Home Health  Visits - [facility] - Please provide dressing materials for patient Home Health Nurse may visit PRN to address patient s wound care needs. FACE TO FACE ENCOUNTER: MEDICARE and MEDICAID PATIENTS: I certify that this patient is under my care and that I had a face-to-face encounter that meets the physician face-to-face encounter requirements with this patient on this date. The encounter with the patient was in whole or in part for the following MEDICAL CONDITION: (primary reason for Home Healthcare) MEDICAL NECESSITY: I certify, that based on my findings, NURSING services are a medically necessary home health service. HOME BOUND STATUS: I certify that my clinical findings support that this patient is homebound (i.e., Due to illness or injury, pt requires aid of supportive devices such as crutches, cane, wheelchairs, walkers, the use of special transportation or the assistance of another person to leave their place of residence. There is a normal inability to leave the home and doing so requires considerable and taxing effort. Other absences are for medical reasons / religious services and are infrequent or of short duration when for other reasons). If current dressing causes regression in wound condition, may D/C ordered dressing product/s and apply Normal Saline Moist Dressing daily until next Wound Healing Center / Other MD appointment. Notify Wound Healing Center of regression in wound condition at [phone]. Please direct any NON-WOUND related issues/requests for orders to patient's Primary Care Physician 1. My suggestion currently is can be that regarding continue with the current wound care measures with regard to the [facility]. I feel like this is doing a good job for her. 2. I am also can recommend at this time that we  going continue with the appropriate offloading she is using the air mattress that does seem to been helpful for her that is excellent news. Overall I am very pleased with  how things seem to be progressing. We will see patient back for reevaluation in 2 weeks here in the clinic. If anything worsens or changes patient will contact our office for additional recommendations. Electronic Signature(s) Signed: 10/17/2019 1:52:06 PM By: Lenda Kelp PA-C Entered By: Lenda Kelp on 10/17/2019 13:52:06 Vellucci, [name] ([id number]) -------------------------------------------------------------------------------- SuperBill Details Patient Name: [name] Date of Service: 10/17/2019 Medical Record Number: [id number] Patient Account Number: [id number] Date of Birth/Sex: 02/20/1966 (54 y.o. F) Treating RN: Curtis Sites Primary Care Provider: [place], JULIE Other Clinician: Referring Provider: [place], JULIE Treating Provider/Extender: STONE III, Byard Carranza Weeks in Treatment: 27 Diagnosis Coding ICD-10 Codes Code Description L89.313 Pressure ulcer of right buttock, stage 3 L89.323 Pressure ulcer of left buttock, stage 3 L89.213 Pressure ulcer of right hip, stage 3 G82.52 Quadriplegia, C1-C4 incomplete E44.0 Moderate protein-calorie malnutrition Facility Procedures CPT4 Code: 38182993 Description: 99214 - WOUND CARE VISIT-LEV 4 EST PT Modifier: Quantity: 1 Physician Procedures CPT4 Code: [id number] Description: 99213 - WC PHYS LEVEL 3 - EST PT Modifier: Quantity: 1 CPT4 Code: Description: ICD-10 Diagnosis Description L89.313 Pressure ulcer of right buttock, stage 3 L89.323 Pressure ulcer of left buttock, stage 3 L89.213 Pressure ulcer of right hip, stage 3 G82.52 Quadriplegia, C1-C4 incomplete Modifier: Quantity: Electronic Signature(s) Signed: 10/17/2019 1:52:22 PM By: Lenda Kelp PA-C Entered By: Lenda Kelp on 10/17/2019 13:52:21

## 2019-10-23 NOTE — Progress Notes (Signed)
[name], [name] ([id number]) Visit Report for 10/17/2019 Arrival Information Details Patient Name: [name], [name] Date of Service: 10/17/2019 10:30 AM Medical Record Number: [id number] Patient Account Number: [id number] Date of Birth/Sex: [birth year] (54 y.o. F) Treating RN: Huel Coventry Primary Care Benancio Osmundson: [place], JULIE Other Clinician: Referring Magie Ciampa: [place], [place]ULIE Treating Khamani Fairley/Extender: Linwood Dibbles, HOYT Weeks in Treatment: 27 Visit Information History Since Last Visit Added or deleted any medications: No Patient Arrived: Wheel Chair Any new allergies or adverse reactions: No Arrival Time: 11:03 Had a fall or experienced change in No Accompanied By: caregiver activities of daily living that may affect Transfer Assistance: Manual risk of falls: Patient Identification Verified: Yes Signs or symptoms of abuse/neglect since last visito No Secondary Verification Process Completed: Yes Hospitalized since last visit: No Patient Requires Transmission-Based Precautions: No Implantable device outside of the clinic excluding No Patient Has Alerts: No cellular tissue based products placed in the center since last visit: Has Dressing in Place as Prescribed: Yes Pain Present Now: Yes Electronic Signature(s) Signed: 10/23/2019 5:10:21 PM By: Elliot Gurney, BSN, RN, CWS, Kim RN, BSN Entered By: Elliot Gurney, BSN, RN, CWS, Kim on 10/17/2019 11:04:17 [name], [name] ([id number]) -------------------------------------------------------------------------------- Clinic Level of Care Assessment Details Patient Name: [name], [name]. Date of Service: 10/17/2019 10:30 AM Medical Record Number: [id number] Patient Account Number: [id number] Date of Birth/Sex: [birth year] (54 y.o. F) Treating RN: Curtis Sites Primary Care Cheynne Virden: [place], JULIE Other Clinician: Referring Taiwana Willison: [place], [place]ULIE Treating Aviah Sorci/Extender: Linwood Dibbles, HOYT Weeks in Treatment: 27 Clinic Level of Care Assessment  Items TOOL 4 Quantity Score []  - Use when only an EandM is performed on FOLLOW-UP visit 0 ASSESSMENTS - Nursing Assessment / Reassessment X - Reassessment of Co-morbidities (includes updates in patient status) 1 10 X- 1 5 Reassessment of Adherence to Treatment Plan ASSESSMENTS - Wound and Skin Assessment / Reassessment []  - Simple Wound Assessment / Reassessment - one wound 0 X- 2 5 Complex Wound Assessment / Reassessment - multiple wounds []  - 0 Dermatologic / Skin Assessment (not related to wound area) ASSESSMENTS - Focused Assessment []  - Circumferential Edema Measurements - multi extremities 0 []  - 0 Nutritional Assessment / Counseling / Intervention []  - 0 Lower Extremity Assessment (monofilament, tuning fork, pulses) []  - 0 Peripheral Arterial Disease Assessment (using hand held doppler) ASSESSMENTS - Ostomy and/or Continence Assessment and Care []  - Incontinence Assessment and Management 0 []  - 0 Ostomy Care Assessment and Management (repouching, etc.) PROCESS - Coordination of Care X - Simple Patient / Family Education for ongoing care 1 15 []  - 0 Complex (extensive) Patient / Family Education for ongoing care X- 1 10 Staff obtains , Records, Test Results / Process Orders []  - 0 Staff telephones HHA, Nursing Homes / Clarify orders / etc []  - 0 Routine Transfer to another Facility (non-emergent condition) []  - 0 Routine Hospital Admission (non-emergent condition) []  - 0 New Admissions / / Ordering NPWT, Apligraf, etc. []  - 0 Emergency Hospital Admission (emergent condition) X- 1 10 Simple Discharge Coordination []  - 0 Complex (extensive) Discharge Coordination PROCESS - Special Needs []  - Pediatric / Minor Patient Management 0 []  - 0 Isolation Patient Management []  - 0 Hearing / Language / Visual special needs []  - 0 Assessment of Community assistance (transportation, D/C planning, etc.) Caraway, Kyung M. ( ) []  -  0 Additional assistance / Altered mentation X- 1 15 Support Surface(s) Assessment (bed, cushion, seat, etc.) INTERVENTIONS - Wound Cleansing / Measurement []  - Simple Wound Cleansing - one  wound 0 X- 2 5 Complex Wound Cleansing - multiple wounds X- 1 5 Wound Imaging (photographs - any number of wounds) []  - 0 Wound Tracing (instead of photographs) []  - 0 Simple Wound Measurement - one wound X- 2 5 Complex Wound Measurement - multiple wounds INTERVENTIONS - Wound Dressings X - Small Wound Dressing one or multiple wounds 2 10 []  - 0 Medium Wound Dressing one or multiple wounds []  - 0 Large Wound Dressing one or multiple wounds []  - 0 Application of Medications - topical []  - 0 Application of Medications - injection INTERVENTIONS - Miscellaneous []  - External ear exam 0 []  - 0 Specimen Collection (cultures, biopsies, blood, body fluids, etc.) []  - 0 Specimen(s) / Culture(s) sent or taken to Lab for analysis []  - 0 Patient Transfer (multiple staff / / Similar devices) []  - 0 Simple Staple / Suture removal (25 or less) []  - 0 Complex Staple / Suture removal (26 or more) []  - 0 Hypo / Hyperglycemic Management (close monitor of Blood Glucose) []  - 0 Ankle / Brachial Index (ABI) - do not check if billed separately X- 1 5 Vital Signs Has the patient been seen at the hospital within the last three years: Yes Total Score: 125 Level Of Care: New/Established - Level 4 Electronic Signature(s) Signed: 10/17/2019 4:33:37 PM By: Entered By: on 10/17/2019 11:33:10 Bellotti, ( ) -------------------------------------------------------------------------------- Encounter Discharge Information Details Patient Name: [name], [name]. Date of Service: 10/17/2019 10:30 AM Medical Record Number: Patient Account Number: Nurse, adult Date of Birth/Sex: [birth year] (54 y.o. F) Treating RN: Primary Care Luxe Cuadros:  , JULIE Other Clinician: Referring Yasiel Goyne: 10/19/2019, JULIE Treating Avenly Roberge/Extender: Curtis Sites, HOYT Weeks in Treatment: 30 Encounter Discharge Information Items Discharge Condition: Stable Ambulatory Status: Wheelchair Discharge Destination: Home Transportation: Private Auto Accompanied By: spouse Schedule Follow-up Appointment: Yes Clinical Summary of Care: Electronic Signature(s) Signed: 10/17/2019 11:41:59 AM By: [name] Entered By: [id number] on 10/17/2019 11:41:58 [name], 10/19/2019 ([id number]) -------------------------------------------------------------------------------- Lower Extremity Assessment Details Patient Name: DELAYZA, LUNGREN. Date of Service: 10/17/2019 10:30 AM Medical Record Number: [id number] Patient Account Number: Curtis Sites Date of Birth/Sex: 12/05/1965 (54 y.o. F) Treating RN: Linwood Dibbles Primary Care Meir Elwood: 34, JULIE Other Clinician: Referring Kenzlei Runions: 10/19/2019, JULIE Treating Timea Breed/Extender: Curtis Sites in Treatment: 27 Electronic Signature(s) Signed: 10/23/2019 5:10:21 PM By: 10/19/2019, BSN, RN, CWS, Kim RN, BSN Entered By: [name], BSN, RN, CWS, Kim on 10/17/2019 11:17:14 Kampe, [name] (10/19/2019) -------------------------------------------------------------------------------- Multi Wound Chart Details Patient Name: REGANA, KEMPLE. Date of Service: 10/17/2019 10:30 AM Medical Record Number: 09/21/1965 Patient Account Number: [id number] Date of Birth/Sex: [birth year] (54 y.o. F) Treating RN: [place] Primary Care Shreena [name]: Skeet Simmer, JULIE Other Clinician: Referring Akshar Starnes: 10/25/2019, JULIE Treating Latarshia Jersey/Extender: STONE III, HOYT Weeks in Treatment: 27 Vital Signs Height(in): 63 Pulse(bpm): 70 Weight(lbs): 70 Blood Pressure(mmHg): 136/72 Body Mass Index(BMI): 12 Temperature(F): 98.2 Respiratory Rate(breaths/min): 16 Photos: [5:No Photos] [N/A:N/A] Wound Location: Right Sacrum Right Trochanter  N/A Wounding Event: Gradually Appeared Gradually Appeared N/A Primary Etiology: Pressure Ulcer Pressure Ulcer N/A Comorbid History: Asthma, Chronic Obstructive Asthma, Chronic Obstructive N/A Pulmonary Disease (COPD), History Pulmonary Disease (COPD), History of pressure wounds, Paraplegia of pressure wounds, Paraplegia Date Acquired: 10/16/2018 04/02/2018 N/A Weeks of Treatment: 27 27 N/A Wound Status: Open Open N/A Clustered Wound: Yes No N/A Clustered Quantity: 5 N/A N/A Measurements L x W x D (cm) 7.5x6x0.1 5.6x2.5x0.3 N/A Area (cm) : 35.343 10.996 N/A Volume (cm) :  3.534 3.299 N/A % Reduction in Area: 25.00% 30.00% N/A % Reduction in Volume: 89.30% 76.70% N/A Starting Position 1 (o'clock): 11 Ending Position 1 (o'clock): 2 Maximum Distance 1 (cm): 0.6 Starting Position 2 (o'clock): 7 Ending Position 2 (o'clock): 8 Maximum Distance 2 (cm): 0.4 Undermining: N/A Yes N/A Classification: Category/Stage III Category/Stage III N/A Exudate Amount: Medium Medium N/A Exudate Type: Serous Serous N/A Exudate Color: amber amber N/A Wound Margin: Flat and Intact Flat and Intact N/A Granulation Amount: Large (67-100%) Large (67-100%) N/A Granulation Quality: Pink Red N/A Necrotic Amount: Small (1-33%) Small (1-33%) N/A Exposed Structures: Fat Layer (Subcutaneous Tissue) Fat Layer (Subcutaneous Tissue) N/A Exposed: Yes Exposed: Yes Fascia: No Fascia: No Tendon: No Tendon: No Muscle: No Muscle: No Joint: No Joint: No Bone: No Bone: No Fagerstrom, Ia M. ([id number]) Epithelialization: Small (1-33%) Small (1-33%) N/A Treatment Notes Electronic Signature(s) Signed: 10/17/2019 4:33:37 PM By: Montey Hora Entered By: Montey Hora on 10/17/2019 11:26:26 Olsen, [name] ([id number]) -------------------------------------------------------------------------------- [place] Details Patient Name: REES, MATURA. Date of Service: 10/17/2019 10:30 AM Medical  Record Number: [id number] Patient Account Number: [id number] Date of Birth/Sex: 12/17/1965 (54 y.o. F) Treating RN: Montey Hora Primary Care Grigor Lipschutz: [place], JULIE Other Clinician: Referring Nasrin Lanzo: [place], [place]UL[place] Treating Tonni Mansour/Extender: STONE III, HOYT Weeks in Treatment: 108 Active Inactive Abuse / Safety / Falls / Self Care Management Nursing Diagnoses: Impaired physical mobility Goals: Patient will not develop complications from immobility Date Initiated: 04/08/2019 Target Resolution Date: 07/12/2019 Goal Status: Active Interventions: Assess fall risk on admission and as needed Notes: Nutrition Nursing Diagnoses: Potential for alteratiion in Nutrition/Potential for imbalanced nutrition Goals: Patient/caregiver agrees to and verbalizes understanding of need to use nutritional supplements and/or vitamins as prescribed Date Initiated: 04/08/2019 Target Resolution Date: 07/12/2019 Goal Status: Active Interventions: Assess patient nutrition upon admission and as needed per policy Notes: Pressure Nursing Diagnoses: Knowledge deficit related to management of pressures ulcers Goals: Patient will remain free of pressure ulcers Date Initiated: 04/08/2019 Target Resolution Date: 07/12/2019 Goal Status: Active Interventions: Assess potential for pressure ulcer upon admission and as needed Notes: Wound/Skin Impairment Nursing Diagnoses: Impaired tissue integrity CANDISE, CRABTREE ([id number]) Goals: Ulcer/skin breakdown will heal within 14 weeks Date Initiated: 04/08/2019 Target Resolution Date: 07/12/2019 Goal Status: Active Interventions: Assess patient/caregiver ability to obtain necessary supplies Assess patient/caregiver ability to perform ulcer/skin care regimen upon admission and as needed Assess ulceration(s) every visit Notes: Electronic Signature(s) Signed: 10/17/2019 4:33:37 PM By: Montey Hora Entered By: Montey Hora on 10/17/2019 11:26:18 Coultas, [name]  ([id number]) -------------------------------------------------------------------------------- Pain Assessment Details Patient Name: Graylon Good. Date of Service: 10/17/2019 10:30 AM Medical Record Number: [id number] Patient Account Number: [id number] Date of Birth/Sex: [birth year] (54 y.o. F) Treating RN: Cornell Barman Primary Care Shanette Tamargo: [place], JULIE Other Clinician: Referring Nissi Doffing: [place], [place]ULIE Treating Sanjay Broadfoot/Extender: STONE III, HOYT Weeks in Treatment: 27 Active Problems Location of Pain Severity and Description of Pain Patient Has Paino Yes Site Locations Pain Location: Pain in Ulcers Rate the pain. Current Pain Level: 5 Pain Management and Medication Current Pain Management: Electronic Signature(s) Signed: 10/23/2019 5:10:21 PM By: Gretta Cool, BSN, RN, CWS, Kim RN, BSN Entered By: Gretta Cool, BSN, RN, CWS, Kim on 10/17/2019 11:05:54 Cogliano, [name] ([id number]) -------------------------------------------------------------------------------- Patient/Caregiver Education Details Patient Name: AIRIKA, ALKHATIB. Date of Service: 10/17/2019 10:30 AM Medical Record Number: [id number] Patient Account Number: [id number] Date of Birth/Gender: Feb 14, 1966 (54 y.o. F) Treating RN: Montey Hora Primary Care Physician: [place], JULIE Other Clinician: Referring Physician: [place], JULIE Treating Physician/Extender: Melburn Hake,  HOYT Weeks in Treatment: 27 Education Assessment Education Provided To: Patient and Caregiver Education Topics Provided Wound/Skin Impairment: Handouts: Other: wound care as ordered Methods: Demonstration, Explain/Verbal Responses: State content correctly Electronic Signature(s) Signed: 10/17/2019 4:33:37 PM By: Curtis Sitesorthy, Joanna Entered By: Curtis Sitesorthy, Joanna on 10/17/2019 11:42:25 Stitzer, [name] ([id number]) -------------------------------------------------------------------------------- Wound Assessment Details Patient Name: [name], Jayliah M. Date of  Service: 10/17/2019 10:30 AM Medical Record Number: [id number] Patient Account Number: [id number] Date of Birth/Sex: January 16, 1966 (54 y.o. F) Treating RN: Huel CoventryWoody, Kim Primary Care Shahan Starks: [place], [place]ULIE Other Clinician: Referring Shavy Beachem: [place], [place]ULIE Treating Meriem Lemieux/Extender: STONE III, HOYT Weeks in Treatment: 27 Wound Status Wound Number: 5 Primary Pressure Ulcer Etiology: Wound Location: Right Sacrum Wound Open Wounding Event: Gradually Appeared Status: Date Acquired: 10/16/2018 Comorbid Asthma, Chronic Obstructive Pulmonary Disease (COPD), Weeks Of Treatment: 27 History: History of pressure wounds, Paraplegia Clustered Wound: Yes Wound Measurements Length: (cm) 7.5 Width: (cm) 6 Depth: (cm) 0.1 Clustered Quantity: 5 Area: (cm) 35.343 Volume: (cm) 3.534 % Reduction in Area: 25% % Reduction in Volume: 89.3% Epithelialization: Small (1-33%) Wound Description Classification: Category/Stage III Wound Margin: Flat and Intact Exudate Amount: Medium Exudate Type: Serous Exudate Color: amber Foul Odor After Cleansing: No Slough/Fibrino Yes Wound Bed Granulation Amount: Large (67-100%) Exposed Structure Granulation Quality: Pink Fascia Exposed: No Necrotic Amount: Small (1-33%) Fat Layer (Subcutaneous Tissue) Exposed: Yes Necrotic Quality: Adherent Slough Tendon Exposed: No Muscle Exposed: No Joint Exposed: No Bone Exposed: No Treatment Notes Wound #5 (Right Sacrum) Notes hydrofera blue, abd and tape Electronic Signature(s) Signed: 10/23/2019 5:10:21 PM By: Elliot GurneyWoody, BSN, RN, CWS, Kim RN, BSN Entered By: Elliot GurneyWoody, BSN, RN, CWS, Kim on 10/17/2019 11:17:58 Netzer, [name] ([id number]) -------------------------------------------------------------------------------- Wound Assessment Details Patient Name: [name], Vedika M. Date of Service: 10/17/2019 10:30 AM Medical Record Number: [id number] Patient Account Number: [id number] Date of Birth/Sex: January 16, 1966 (54 y.o.  F) Treating RN: Huel CoventryWoody, Kim Primary Care Ladeana Laplant: [place], [place]ULIE Other Clinician: Referring Anniya Whiters: [place], [place]ULIE Treating Lukis Bunt/Extender: STONE III, HOYT Weeks in Treatment: 27 Wound Status Wound Number: 7 Primary Pressure Ulcer Etiology: Wound Location: Right Trochanter Wound Open Wounding Event: Gradually Appeared Status: Date Acquired: 04/02/2018 Comorbid Asthma, Chronic Obstructive Pulmonary Disease (COPD), Weeks Of Treatment: 27 History: History of pressure wounds, Paraplegia Clustered Wound: No Photos Wound Measurements Length: (cm) 5.6 % Reducti Width: (cm) 2.5 % Reducti Depth: (cm) 0.3 Epithelia Area: (cm) 10.996 Undermin Volume: (cm) 3.299 Locat Sta End Max Locati Sta End Max on in Area: 30% on in Volume: 76.7% lization: Small (1-33%) ing: Yes ion 1 rting Position (o'clock): 11 ing Position (o'clock): 2 imum Distance: (cm) 0.6 on 2 rting Position (o'clock): 7 ing Position (o'clock): 8 imum Distance: (cm) 0.4 Wound Description Classification: Category/Stage III Foul Odor Wound Margin: Flat and Intact Slough/Fi Exudate Amount: Medium Exudate Type: Serous Exudate Color: amber After Cleansing: No brino No Wound Bed Granulation Amount: Large (67-100%) Exposed Structure Granulation Quality: Red Fascia Exposed: No Necrotic Amount: Small (1-33%) Fat Layer (Subcutaneous Tissue) Exposed: Yes Necrotic Quality: Adherent Slough Tendon Exposed: No Muscle Exposed: No Joint Exposed: No Bone Exposed: No Tuch, Jazzmyn M. ([id number]) Treatment Notes Wound #7 (Right Trochanter) Notes hydrofera blue, abd and tape Electronic Signature(s) Signed: 10/23/2019 5:10:21 PM By: Elliot GurneyWoody, BSN, RN, CWS, Kim RN, BSN Entered By: Elliot GurneyWoody, BSN, RN, CWS, Kim on 10/17/2019 11:15:20 Conger, [name] ([id number]) -------------------------------------------------------------------------------- Vitals Details Patient Name: [name], Darianne M. Date of Service: 10/17/2019  10:30 AM Medical Record Number: [id number] Patient Account Number: [id number] Date of Birth/Sex: January 16, 1966 (54 y.o. F)  Treating RN: Huel Coventry Primary Care Etola Mull: [place], JULIE Other Clinician: Referring Ridhi Hoffert: [place], [place]ULIE Treating Naomie Crow/Extender: STONE III, HOYT Weeks in Treatment: 27 Vital Signs Time Taken: 11:04 Temperature (F): 98.2 Height (in): 63 Pulse (bpm): 70 Weight (lbs): 70 Respiratory Rate (breaths/min): 16 Body Mass Index (BMI): 12.4 Blood Pressure (mmHg): 136/72 Reference Range: 80 - 120 mg / dl Electronic Signature(s) Signed: 10/23/2019 5:10:21 PM By: Elliot Gurney, BSN, RN, CWS, Kim RN, BSN Entered By: Elliot Gurney, BSN, RN, CWS, Kim on 10/17/2019 11:05:18

## 2019-10-31 ENCOUNTER — Encounter: Payer: Medicare Other | Admitting: Physician Assistant

## 2019-10-31 ENCOUNTER — Other Ambulatory Visit: Payer: Self-pay

## 2019-10-31 DIAGNOSIS — L89313 Pressure ulcer of right buttock, stage 3: Secondary | ICD-10-CM | POA: Diagnosis not present

## 2019-10-31 NOTE — Progress Notes (Addendum)
CANAAN, [name] ([id number]) Visit Report for 10/31/2019 Arrival Information Details Patient Name: [name], [name] Date of Service: 10/31/2019 12:45 PM Medical Record Number: [id number] Patient Account Number: [id number] Date of Birth/Sex: 18-Nov-1965 (54 y.o. F) Treating RN: Montey Hora Primary Care Glorian Mcdonell: [place], JULIE Other Clinician: Referring Eligah Anello: [place], JULIE Treating Senia Even/Extender: Melburn Hake, HOYT Weeks in Treatment: 74 Visit Information History Since Last Visit Added or deleted any medications: No Patient Arrived: Wheel Chair Any new allergies or adverse reactions: No Arrival Time: 12:50 Had a fall or experienced change in No Accompanied By: friend activities of daily living that may affect Transfer Assistance: Manual risk of falls: Patient Identification Verified: Yes Signs or symptoms of abuse/neglect since last visito No Secondary Verification Process Completed: Yes Hospitalized since last visit: No Patient Requires Transmission-Based Precautions: No Implantable device outside of the clinic excluding No Patient Has Alerts: No cellular tissue based products placed in the center since last visit: Has Dressing in Place as Prescribed: Yes Pain Present Now: Yes Electronic Signature(s) Signed: 10/31/2019 4:14:40 PM By: Lorine Bears RCP, RRT, CHT Entered By: Becky Sax, Amado Nash on 10/31/2019 12:51:54 [name], [name] ([id number]) -------------------------------------------------------------------------------- Clinic Level of Care Assessment Details Patient Name: [name], [name] Date of Service: 10/31/2019 12:45 PM Medical Record Number: [id number] Patient Account Number: [id number] Date of Birth/Sex: [birth year] (54 y.o. F) Treating RN: Montey Hora Primary Care Stanely Sexson: [place], JULIE Other Clinician: Referring Takeria Marquina: [place], JULIE Treating [place] Landgrebe/Extender: Melburn Hake, HOYT Weeks in Treatment: 29 Clinic Level of Care  Assessment Items TOOL 4 Quantity Score []  - Use when only an EandM is performed on FOLLOW-UP visit 0 ASSESSMENTS - Nursing Assessment / Reassessment X - Reassessment of Co-morbidities (includes updates in patient status) 1 10 X- 1 5 Reassessment of Adherence to Treatment Plan ASSESSMENTS - Wound and Skin Assessment / Reassessment []  - Simple Wound Assessment / Reassessment - one wound 0 X- 2 5 Complex Wound Assessment / Reassessment - multiple wounds []  - 0 Dermatologic / Skin Assessment (not related to wound area) ASSESSMENTS - Focused Assessment []  - Circumferential Edema Measurements - multi extremities 0 []  - 0 Nutritional Assessment / Counseling / Intervention []  - 0 Lower Extremity Assessment (monofilament, tuning fork, pulses) []  - 0 Peripheral Arterial Disease Assessment (using hand held doppler) ASSESSMENTS - Ostomy and/or Continence Assessment and Care []  - Incontinence Assessment and Management 0 []  - 0 Ostomy Care Assessment and Management (repouching, etc.) PROCESS - Coordination of Care X - Simple Patient / Family Education for ongoing care 1 15 []  - 0 Complex (extensive) Patient / Family Education for ongoing care X- 1 10 Staff obtains Programmer, systems, Records, Test Results / Process Orders []  - 0 Staff telephones HHA, Nursing Homes / Clarify orders / etc []  - 0 Routine Transfer to another Facility (non-emergent condition) []  - 0 Routine Hospital Admission (non-emergent condition) []  - 0 New Admissions / Biomedical engineer / Ordering NPWT, Apligraf, etc. []  - 0 Emergency Hospital Admission (emergent condition) X- 1 10 Simple Discharge Coordination []  - 0 Complex (extensive) Discharge Coordination PROCESS - Special Needs []  - Pediatric / Minor Patient Management 0 []  - 0 Isolation Patient Management []  - 0 Hearing / Language / Visual special needs []  - 0 Assessment of Community assistance (transportation, D/C planning, etc.) []  - 0 Additional  assistance / Altered mentation []  - 0 Support Surface(s) Assessment (bed, cushion, seat, etc.) INTERVENTIONS - Wound Cleansing / Measurement Scarpino, Jorgina M. ([id number]) []  - 0 Simple Wound Cleansing - one wound X-  2 5 Complex Wound Cleansing - multiple wounds X- 1 5 Wound Imaging (photographs - any number of wounds) []  - 0 Wound Tracing (instead of photographs) []  - 0 Simple Wound Measurement - one wound X- 2 5 Complex Wound Measurement - multiple wounds INTERVENTIONS - Wound Dressings X - Small Wound Dressing one or multiple wounds 2 10 []  - 0 Medium Wound Dressing one or multiple wounds []  - 0 Large Wound Dressing one or multiple wounds []  - 0 Application of Medications - topical []  - 0 Application of Medications - injection INTERVENTIONS - Miscellaneous []  - External ear exam 0 []  - 0 Specimen Collection (cultures, biopsies, blood, body fluids, etc.) []  - 0 Specimen(s) / Culture(s) sent or taken to Lab for analysis []  - 0 Patient Transfer (multiple staff / / Similar devices) []  - 0 Simple Staple / Suture removal (25 or less) []  - 0 Complex Staple / Suture removal (26 or more) []  - 0 Hypo / Hyperglycemic Management (close monitor of Blood Glucose) []  - 0 Ankle / Brachial Index (ABI) - do not check if billed separately X- 1 5 Vital Signs Has the patient been seen at the hospital within the last three years: Yes Total Score: 110 Level Of Care: New/Established - Level 3 Electronic Signature(s) Signed: 10/31/2019 4:19:26 PM By: Entered By: on 10/31/2019 13:33:20 [place], ( ) -------------------------------------------------------------------------------- Encounter Discharge Information Details Patient Name: [name], [name] Date of Service: 10/31/2019 12:45 PM Medical Record Number: Patient Account Number: Nurse, adult Date of Birth/Sex: [birth year] (54 y.o. F) Treating RN: Primary Care Madai Nuccio: , JULIE Other Clinician: Referring Jochebed Bills: 11/02/2019, JULIE Treating Stacy Deshler/Extender: Curtis Sites, HOYT Weeks in Treatment: 45 Encounter Discharge Information Items Discharge Condition: Stable Ambulatory Status: Wheelchair Discharge Destination: Home Transportation: Private Auto Accompanied By: caregiver Schedule Follow-up Appointment: Yes Clinical Summary of Care: Electronic Signature(s) Signed: 10/31/2019 4:19:26 PM By: [name] Entered By: [id number] on 10/31/2019 13:34:01 Piatt, 11/02/2019 ([id number]) -------------------------------------------------------------------------------- Lower Extremity Assessment Details Patient Name: [name], [name] Date of Service: 10/31/2019 12:45 PM Medical Record Number: [id number] Patient Account Number: Curtis Sites Date of Birth/Sex: [birth year] (54 y.o. F) Treating RN: Linwood Dibbles Primary Care Ravina Milner: 37, JULIE Other Clinician: Referring Khaleesi Gruel: 11/02/2019, JULIE Treating Froylan Hobby/Extender: Curtis Sites, HOYT Weeks in Treatment: 29 Electronic Signature(s) Signed: 10/31/2019 3:34:13 PM By: 11/02/2019 Entered By: [name] on 10/31/2019 13:01:51 Lipinski, [name] (11/02/2019) -------------------------------------------------------------------------------- Multi Wound Chart Details Patient Name: [name], [name] Date of Service: 10/31/2019 12:45 PM Medical Record Number: 09/21/1965 Patient Account Number: [id number] Date of Birth/Sex: Jan 12, 1966 (54 y.o. F) Treating RN: [place] Primary Care Eusebio Blazejewski: Linwood Dibbles, JULIE Other Clinician: Referring Samyra Limb: 11/02/2019, JULIE Treating Rechelle Niebla/Extender: STONE III, HOYT Weeks in Treatment: 29 Vital Signs Height(in): 63 Pulse(bpm): 78 Weight(lbs): 70 Blood Pressure(mmHg): 116/67 Body Mass Index(BMI): 12 Temperature(F): 98.5 Respiratory Rate(breaths/min): 16 Photos: [N/A:N/A] Wound Location: Right Sacrum Right Trochanter N/A Wounding  Event: Gradually Appeared Gradually Appeared N/A Primary Etiology: Pressure Ulcer Pressure Ulcer N/A Comorbid History: Asthma, Chronic Obstructive Asthma, Chronic Obstructive N/A Pulmonary Disease (COPD), History Pulmonary Disease (COPD), History of pressure wounds, Paraplegia of pressure wounds, Paraplegia Date Acquired: 10/16/2018 04/02/2018 N/A Weeks of Treatment: 29 29 N/A Wound Status: Open Open N/A Clustered Wound: Yes No N/A Clustered Quantity: 5 N/A N/A Measurements L x W x D (cm) 6x6.5x0.1 3x4x0.3 N/A Area (cm) : 30.631 9.425 N/A Volume (cm) : 3.063 2.827 N/A % Reduction in Area: 35.00% 40.00% N/A % Reduction in  Volume: 90.70% 80.00% N/A Classification: Category/Stage III Category/Stage III N/A Exudate Amount: Medium Medium N/A Exudate Type: Serous Serous N/A Exudate Color: amber amber N/A Wound Margin: Flat and Intact Flat and Intact N/A Granulation Amount: Large (67-100%) Large (67-100%) N/A Granulation Quality: Pink Red N/A Necrotic Amount: Small (1-33%) Small (1-33%) N/A Exposed Structures: Fat Layer (Subcutaneous Tissue) Fat Layer (Subcutaneous Tissue) N/A Exposed: Yes Exposed: Yes Fascia: No Fascia: No Tendon: No Tendon: No Muscle: No Muscle: No Joint: No Joint: No Bone: No Bone: No Epithelialization: Small (1-33%) Small (1-33%) N/A Treatment Notes Electronic Signature(s) Signed: 10/31/2019 4:19:26 PM By: Curtis Sites Entered By: Curtis Sites on 10/31/2019 13:32:36 Stidd, [name] ([id number]) Whitesel, [name] ([id number]) -------------------------------------------------------------------------------- Multi-Disciplinary Care Plan Details Patient Name: [name], [name] Date of Service: 10/31/2019 12:45 PM Medical Record Number: [id number] Patient Account Number: [id number] Date of Birth/Sex: 08/30/1965 (54 y.o. F) Treating RN: Curtis Sites Primary Care Noach Calvillo: [place], JULIE Other Clinician: Referring Camile Esters: [place], JULIE Treating  Brenlynn Fake/Extender: STONE III, HOYT Weeks in Treatment: 89 Active Inactive Abuse / Safety / Falls / Self Care Management Nursing Diagnoses: Impaired physical mobility Goals: Patient will not develop complications from immobility Date Initiated: 04/08/2019 Target Resolution Date: 07/12/2019 Goal Status: Active Interventions: Assess fall risk on admission and as needed Notes: Nutrition Nursing Diagnoses: Potential for alteratiion in Nutrition/Potential for imbalanced nutrition Goals: Patient/caregiver agrees to and verbalizes understanding of need to use nutritional supplements and/or vitamins as prescribed Date Initiated: 04/08/2019 Target Resolution Date: 07/12/2019 Goal Status: Active Interventions: Assess patient nutrition upon admission and as needed per policy Notes: Pressure Nursing Diagnoses: Knowledge deficit related to management of pressures ulcers Goals: Patient will remain free of pressure ulcers Date Initiated: 04/08/2019 Target Resolution Date: 07/12/2019 Goal Status: Active Interventions: Assess potential for pressure ulcer upon admission and as needed Notes: Wound/Skin Impairment Nursing Diagnoses: Impaired tissue integrity Goals: Ulcer/skin breakdown will heal within 14 weeks Date Initiated: 04/08/2019 Target Resolution Date: 07/12/2019 Goal Status: Active [name], [name] ([id number]) Interventions: Assess patient/caregiver ability to obtain necessary supplies Assess patient/caregiver ability to perform ulcer/skin care regimen upon admission and as needed Assess ulceration(s) every visit Notes: Electronic Signature(s) Signed: 10/31/2019 4:19:26 PM By: Curtis Sites Entered By: Curtis Sites on 10/31/2019 13:32:27 Saraceno, [name] ([id number]) -------------------------------------------------------------------------------- Pain Assessment Details Patient Name: [name] Date of Service: 10/31/2019 12:45 PM Medical Record Number:  [id number] Patient Account Number: [id number] Date of Birth/Sex: [birth year] (54 y.o. F) Treating RN: Rodell Perna Primary Care Alyce Inscore: [place], JULIE Other Clinician: Referring Vonne Mcdanel: [place], JULIE Treating Jasean Ambrosia/Extender: STONE III, HOYT Weeks in Treatment: 29 Active Problems Location of Pain Severity and Description of Pain Patient Has Paino No Site Locations Pain Management and Medication Current Pain Management: Electronic Signature(s) Signed: 10/31/2019 3:34:13 PM By: Rodell Perna Entered By: Rodell Perna on 10/31/2019 13:00:35 Ramnath, Marinda Judie Petit ([id number]) -------------------------------------------------------------------------------- Wound Assessment Details Patient Name: [name] Date of Service: 10/31/2019 12:45 PM Medical Record Number: [id number] Patient Account Number: [id number] Date of Birth/Sex: [birth year] (54 y.o. F) Treating RN: Rodell Perna Primary Care Ulys Favia: [place], JULIE Other Clinician: Referring Evelyn Aguinaldo: [place], [place]ULIE Treating Meril Dray/Extender: STONE III, HOYT Weeks in Treatment: 29 Wound Status Wound Number: 5 Primary Pressure Ulcer Etiology: Wound Location: Right Sacrum Wound Open Wounding Event: Gradually Appeared Status: Date Acquired: 10/16/2018 Comorbid Asthma, Chronic Obstructive Pulmonary Disease (COPD), Weeks Of Treatment: 29 History: History of pressure wounds, Paraplegia Clustered Wound: Yes Photos Wound Measurements Length: (cm) 6 Width: (cm) 6.5 Depth: (cm) 0.1 Clustered Quantity: 5 Area: (cm) 30.631 Volume: (  cm) 3.063 % Reduction in Area: 35% % Reduction in Volume: 90.7% Epithelialization: Small (1-33%) Wound Description Classification: Category/Stage III Foul Wound Margin: Flat and Intact Slou Exudate Amount: Medium Exudate Type: Serous Exudate Color: amber Odor After Cleansing: No gh/Fibrino Yes Wound Bed Granulation Amount: Large (67-100%) Exposed Structure Granulation Quality: Pink Fascia  Exposed: No Necrotic Amount: Small (1-33%) Fat Layer (Subcutaneous Tissue) Exposed: Yes Necrotic Quality: Adherent Slough Tendon Exposed: No Muscle Exposed: No Joint Exposed: No Bone Exposed: No Treatment Notes Wound #5 (Right Sacrum) Notes hydrofera blue, abd and tape [name], [name] ([id number]) Electronic Signature(s) Signed: 10/31/2019 3:34:13 PM By: Rodell Perna Entered By: Rodell Perna on 10/31/2019 13:01:16 [name], [name] ([id number]) -------------------------------------------------------------------------------- Wound Assessment Details Patient Name: [name] Date of Service: 10/31/2019 12:45 PM Medical Record Number: [id number] Patient Account Number: [id number] Date of Birth/Sex: 20-Dec-1965 (53 y.o. F) Treating RN: Rodell Perna Primary Care Alexanderjames Berg: [place], JULIE Other Clinician: Referring Azel Gumina: [place], JULIE Treating Robina Hamor/Extender: STONE III, HOYT Weeks in Treatment: 29 Wound Status Wound Number: 7 Primary Pressure Ulcer Etiology: Wound Location: Right Trochanter Wound Open Wounding Event: Gradually Appeared Status: Date Acquired: 04/02/2018 Comorbid Asthma, Chronic Obstructive Pulmonary Disease (COPD), Weeks Of Treatment: 29 History: History of pressure wounds, Paraplegia Clustered Wound: No Photos Wound Measurements Length: (cm) 3 % Reduc Width: (cm) 4 % Reduc Depth: (cm) 0.3 Epithel Area: (cm) 9.425 Volume: (cm) 2.827 tion in Area: 40% tion in Volume: 80% ialization: Small (1-33%) Wound Description Classification: Category/Stage III Foul O Wound Margin: Flat and Intact Slough Exudate Amount: Medium Exudate Type: Serous Exudate Color: amber dor After Cleansing: No /Fibrino No Wound Bed Granulation Amount: Large (67-100%) Exposed Structure Granulation Quality: Red Fascia Exposed: No Necrotic Amount: Small (1-33%) Fat Layer (Subcutaneous Tissue) Exposed: Yes Necrotic Quality: Adherent Slough Tendon Exposed: No Muscle  Exposed: No Joint Exposed: No Bone Exposed: No Treatment Notes Wound #7 (Right Trochanter) Notes hydrofera blue, abd and tape Electronic Signature(s) Signed: 10/31/2019 3:34:13 PM By: Maylon Cos ([id number]) Entered By: Rodell Perna on 10/31/2019 13:01:35 Lupi, [name] ([id number]) -------------------------------------------------------------------------------- Vitals Details Patient Name: [name] Date of Service: 10/31/2019 12:45 PM Medical Record Number: [id number] Patient Account Number: [id number] Date of Birth/Sex: [birth year] (54 y.o. F) Treating RN: Curtis Sites Primary Care Shaquira Moroz: [place], JULIE Other Clinician: Referring Aadam Zhen: [place], JULIE Treating [place]/Extender: STONE III, HOYT Weeks in Treatment: 29 Vital Signs Time Taken: 12:50 Temperature (F): 98.5 Height (in): 63 Pulse (bpm): 78 Weight (lbs): 70 Respiratory Rate (breaths/min): 16 Body Mass Index (BMI): 12.4 Blood Pressure (mmHg): 116/67 Reference Range: 80 - 120 mg / dl Electronic Signature(s) Signed: 10/31/2019 4:14:40 PM By: Dayton Martes RCP, RRT, CHT Entered By: Dayton Martes on 10/31/2019 12:52:30

## 2019-10-31 NOTE — Progress Notes (Addendum)
ANAAYA, FUSTER ([id number]) Visit Report for 10/31/2019 Chief Complaint Document Details Patient Name: [name], [name] Date of Service: 10/31/2019 12:45 PM Medical Record Number: [id number] Patient Account Number: [id number] Date of Birth/Sex: 01-04-1966 (54 y.o. F) Treating RN: Curtis Sites Primary Care Provider: [place], JULIE Other Clinician: Referring Provider: [place], JULIE Treating Provider/Extender: STONE III, Kerrie Timm Weeks in Treatment: 29 Information Obtained from: Patient Chief Complaint Bilateral gluteal and right hip pressure ulcers Electronic Signature(s) Signed: 10/31/2019 12:48:12 PM By: Lenda Kelp PA-C Entered By: Lenda Kelp on 10/31/2019 12:48:11 Polimeni, [name] ([id number]) -------------------------------------------------------------------------------- HPI Details Patient Name: [name], [name] Date of Service: 10/31/2019 12:45 PM Medical Record Number: [id number] Patient Account Number: [id number] Date of Birth/Sex: 1965/07/24 (54 y.o. F) Treating RN: Curtis Sites Primary Care Provider: [place], JULIE Other Clinician: Referring Provider: [place], JULIE Treating Provider/Extender: Linwood Dibbles, Zophia Marrone Weeks in Treatment: 29 History of Present Illness HPI Description: 54 year old patient who was been an unreliable historian says she had completely healed in February and most recently her home health noticed that the wound on the right scapular area had reopened. She was in hospital about a month ago with pneumonia at that time a workup was done. We have no notes today but will workup the electronic medical record. on review of her records electronically I do not find any recent hospital MRI in the last 6 months. She had a CT scan of the chest in February 2018 which does not show any osseous lesions. she was admitted to the hospital between May 14 and May 17 for 3 days for mucus plugging and collapse of the left lung. at that time she had a decubitus ulcer  of the right scapular region stage III. her history was noted to have quadriplegia C5-C7 incomplete, neurogenic bladder, nephrostomy catheter and emphysema. patient tells me that she has not smoked for last month and a half but as per her hospital records she was smoking in the middle of May. 01/12/17 patient scapular region appears to be doing fairly well on evaluation today. She is having really no syndicate discomfort and has been tolerating the dressing changes well complication. Readmission: 04/08/2019 patient presents today for reevaluation in our clinic concerning issues she is having with wounds on the bilateral gluteal area as well as on the right trochanter. She has unfortunately noted that things seem to have gotten worse as she was using the Clinitron bed. The patient states it gives off a lot of heat and subsequently has not been good for her skin causing her to breakdown more. She has been trying to get back just to the air mattress but has not been able to get the company to come pick up that bed and bring out the air mattress. Her primary care provider is on this as well trying to get them to do so. Unfortunately her wounds are significantly large and show signs of hyper granulation as well. She has had this kind of issue for quite some time and unfortunately just does not seem to be showing signs of significant improvement with what she has been using currently. No fevers, chills, nausea, vomiting, or diarrhea. She does attempt appropriate offloading and again has been using the bed because that is all she has although she is not happy with it. 04/22/2019 on evaluation today patient appears to be doing about the same with regard to her wounds at this time. Fortunately there does not appear to be any signs of active infection at this point which  is good news. She has been tolerating the dressing changes without complication. Overall I am pleased with that with that being said  unfortunately she does have a little bit of more significant breakdown with regard to the wound bed upon evaluation today. I believe this is secondary to the fact that she has been lying potentially too much in a single position for too long over the past several weeks. I think that potentially if she were to mitigate this to a degree that things may improve somewhat. With that being said she is really not happy with her current bed she is attempted to have them come out and switch this back to an air mattress but unfortunately has not been successful in getting them to come out and make this change. She is very frustrated with that. She still has the Clinitron bed and she much prefers just having the standard air mattress. Patient's primary care provider still working on this she tells me. 05/08/2019 on evaluation today patient unfortunately is not doing as well as I would like to see in regard to her wounds. She actually is measuring a little bit larger pretty much at all wound locations unfortunately and overall I am very upset as well with the fact that she has been trying to get the Clinitron bed removed from her home but is not having any luck as such. We did get the number for the company today in order for [place] to see about calling them and switching this over to an air mattress as this is giving her a lot of trouble she states is really hot and she feels like it is breaking down her skin. This is not good. 05/22/2019 on evaluation today patient appears to be doing well with regard to the wounds she is showing some signs of slight improvement at this point. This is good news. Fortunately there is no evidence of active infection at this time which is also good news. No fevers, chills, nausea, vomiting, or diarrhea. She does tell me that in order to get rid of the Clinitron bed that she has been working on that the representative from the company stated they needed in order for me asked to be  getting rid of it and then what we wanted her to have. Again I will be more than happy to do that we can initiate an air mattress for her. 06/06/2019 on evaluation today patient continues to have issues with her wounds. The good news is we figured out what to be done for the her bad as far as get not moved however she is getting actually ready to move and change residences. For that reason what they are going to do is come in and take the bed from the current residence to get rid of the Clinitron bed and then subsequently move the new bed into her new place of residence. This makes complete sense to me as well and I think that sounds like a good plan. At least we have a plan for what to do as far as getting things taken care of here. 06/24/2019 on evaluation today patient appears to be doing really about the same in some regards although a couple of the wounds are measuring a little bit larger and deeper compared to previous. With that being said I do think that unfortunately her wounds seem to be overall progressing in the wrong direction towards being worse not better. The patient is still having to use the Clinitron bed at  this time I do think that she really needs to switch to just a standard air mattress that is what she wants but she is having to wait until she moves in 2 weeks before she can get the new mattress so she does not have to pay to have the bed moved twice. 07/18/2019 on evaluation today patient appears to actually be doing somewhat better in regard to her wounds in general. I am very pleased in this regard. With that being said she still has some hyper granulation unfortunately the Hydrofera Blue just was getting stuck to her and the silver cell has done better. Nonetheless I do believe that we do need to probably treat this with some silver nitrate to try to help with the hypergranulation I discussed this with the patient today. She is in agreement with this plan. 07/29/2019 on  evaluation today patient seems to be making some progress albeit slow regard to her wounds. Fortunately there is no signs of active infection at this time. No fever chills noted. She overall seems to be doing quite well at this point. [name], [name] ([id number]) 08/12/2019 upon evaluation today patient appears to be doing about the same at this point in regard to her wounds. She still has some hyper granulation. I really feel like she would do better with Hydrofera Blue over the alginate itself. With that being said we have not been able to use that for a while as it was sticking but again it has been sometime she was having issues with a different bed at that time as well which may have contributed to some of the issues at that point. Fortunately there is no evidence of active infection at this time. No fevers, chills, nausea, vomiting, or diarrhea. 09/04/2019 upon evaluation today patient actually appears to be doing excellent in regard to her wounds across the board. I am extremely pleased with how things appear today. She has a lot of new epithelization, good granulation, and overall she states that she is doing very well with the current bed. Everything about today seems to be on the up and up. 09/25/2019 upon evaluation today patient appears to be doing excellent in regard to her wounds. She has been tolerating the dressing changes without complication. Fortunately there is no evidence of active infection at this time. No fevers, chills, nausea, vomiting, or diarrhea. Overall she is actually been making great progress since getting off of the air-fluidized mattress I am happy in that regard. 10/17/2019 upon evaluation today patient appears to be doing well at this point in regard to her wounds. She is making good progress at this time which is good news. Fortunately there is no signs of active infection at this time. No fevers, chills, nausea, vomiting, or diarrhea. 10/31/2019 upon evaluation today  patient actually appears to be doing excellent in regard to her wounds currently. I feel like the [facility] is doing a great job. Overall very pleased with the progress she is making. 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 For about 4-5 months she's had a decubitus ulcer on the right scapular region. she was lost to follow-up for the last 6 months. she had a couple of small areas which have opened out in the region of previous scar tissue in the sacral region but these haveall healed now. She does not have any other significant comorbidities. She has a  good Roho cushion for her wheelchair and she has a specialized mattress for sleep. She has restarted smoking and smokes about half a pack of cigarettes a day 02/18/2015 -- X-ray of the pelvis -- IMPRESSION:No acute osseous injury of the pelvis. If there is clinical concern regarding osteomyelitis, recommend further evaluation with CT. X-ray of the sacrum and coccyx IMPRESSION:No acute osseous abnormality of the sacrum. If there is further clinical concern regarding osteomyelitis of the sacrum, further evaluation with CT is recommended. ========= Electronic Signature(s) Signed: 10/31/2019 1:32:14 PM By: Lenda KelpStone III, Faylynn Stamos PA-C Entered By: Lenda KelpStone III, Cortnie Ringel on 10/31/2019 13:32:14 Margraf, [name] ([id number]) -------------------------------------------------------------------------------- Physical Exam Details Patient Name: [name] MastersWADE, Emmerson M. Date of Service: 10/31/2019 12:45 PM Medical Record Number: [id number] Patient Account Number: [id number] Date of Birth/Sex: [birth year] (54 y.o. F) Treating RN: Curtis Sitesorthy, Joanna Primary Care Provider: [place], JULIE Other Clinician: Referring Provider: [place], JULIE Treating Provider/Extender: STONE III, Kinta Martis Weeks in Treatment: 29 Constitutional Thin and well-hydrated in no  acute distress. Respiratory normal breathing without difficulty. Psychiatric this patient is able to make decisions and demonstrates good insight into disease process. Alert and Oriented x 3. pleasant and cooperative. Notes Patient's wound bed currently showed signs of fairly good granulation there does not appear to be any evidence of infection and overall I think she is managing quite nicely which is great news. The air mattress is doing extremely well for her as well. Electronic Signature(s) Signed: 10/31/2019 1:32:38 PM By: Lenda KelpStone III, Zamir Staples PA-C Entered By: Lenda KelpStone III, Tomothy Eddins on 10/31/2019 13:32:37 Fatula, [name] ([id number]) -------------------------------------------------------------------------------- Physician Orders Details Patient Name: [name] MastersWADE, Avannah M. Date of Service: 10/31/2019 12:45 PM Medical Record Number: [id number] Patient Account Number: [id number] Date of Birth/Sex: [birth year] (54 y.o. F) Treating RN: Curtis Sitesorthy, Joanna Primary Care Provider: [place], JULIE Other Clinician: Referring Provider: [place], JULIE Treating Provider/Extender: Linwood DibblesSTONE III, Sherril Shipman Weeks in Treatment: 229 Verbal / Phone Orders: No Diagnosis Coding ICD-10 Coding Code Description L89.313 Pressure ulcer of right buttock, stage 3 L89.323 Pressure ulcer of left buttock, stage 3 L89.213 Pressure ulcer of right hip, stage 3 G82.52 Quadriplegia, C1-C4 incomplete E44.0 Moderate protein-calorie malnutrition Wound Cleansing Wound #5 Right Sacrum o Clean wound with Normal Saline. o May Shower, gently pat wound dry prior to applying new dressing. Wound #7 Right Trochanter o Clean wound with Normal Saline. o May Shower, gently pat wound dry prior to applying new dressing. Primary Wound Dressing Wound #5 Right Sacrum o Hydrafera Blue Ready Transfer - Please order Hydrofera Blue Ready Transfer specifically for patient r/t wound location and wet with saline or warm soapy water prior to removal Wound  #7 Right Trochanter o Hydrafera Blue Ready Transfer - Please order Hydrofera Blue Ready Transfer specifically for patient r/t wound location and wet with saline or warm soapy water prior to removal Secondary Dressing Wound #5 Right Sacrum o ABD pad - Secure with tape or bordered foam dressing Wound #7 Right Trochanter o ABD pad - Secure with tape or bordered foam dressing Dressing Change Frequency Wound #5 Right Sacrum o Change Dressing Monday, Wednesday, Friday - or three times weekly Wound #7 Right Trochanter o Change Dressing Monday, Wednesday, Friday - or three times weekly Follow-up Appointments Wound #5 Right Sacrum o Return Appointment in 2 weeks. Wound #7 Right Trochanter o Return Appointment in 2 weeks. Off-Loading Wound #5 Right Sacrum o Mattress - air mattress or fluidized air mattress o Turn and reposition every 2 hours Wound #7 Right Trochanter [name], [name] ([id number]) o Mattress - air  mattress or fluidized air mattress o Turn and reposition every 2 hours Additional Orders / Instructions Wound #5 Right Sacrum o Increase protein intake. o Increase protein intake. Wound #7 Right Trochanter o Increase protein intake. o Increase protein intake. Home Health Wound #5 Right Sacrum o Continue Home Health Visits - [facility] - Please provide dressing materials for patient o Home Health Nurse may visit PRN to address patientos wound care needs. o FACE TO FACE ENCOUNTER: MEDICARE and MEDICAID PATIENTS: I certify that this patient is under my care and that I had a face-to-face encounter that meets the physician face-to-face encounter requirements with this patient on this date. The encounter with the patient was in whole or in part for the following MEDICAL CONDITION: (primary reason for Home Healthcare) MEDICAL NECESSITY: I certify, that based on my findings, NURSING services are a medically necessary home health service. HOME BOUND  STATUS: I certify that my clinical findings support that this patient is homebound (i.e., Due to illness or injury, pt requires aid of supportive devices such as crutches, cane, wheelchairs, walkers, the use of special transportation or the assistance of another person to leave their place of residence. There is a normal inability to leave the home and doing so requires considerable and taxing effort. Other absences are for medical reasons / religious services and are infrequent or of short duration when for other reasons). o If current dressing causes regression in wound condition, may D/C ordered dressing product/s and apply Normal Saline Moist Dressing daily until next Wound Healing Center / Other MD appointment. Notify Wound Healing Center of regression in wound condition at [phone]. o Please direct any NON-WOUND related issues/requests for orders to patient's Primary Care Physician Wound #7 Right Trochanter o Continue Home Health Visits - [facility] - Please provide dressing materials for patient o Home Health Nurse may visit PRN to address patientos wound care needs. o FACE TO FACE ENCOUNTER: MEDICARE and MEDICAID PATIENTS: I certify that this patient is under my care and that I had a face-to-face encounter that meets the physician face-to-face encounter requirements with this patient on this date. The encounter with the patient was in whole or in part for the following MEDICAL CONDITION: (primary reason for Home Healthcare) MEDICAL NECESSITY: I certify, that based on my findings, NURSING services are a medically necessary home health service. HOME BOUND STATUS: I certify that my clinical findings support that this patient is homebound (i.e., Due to illness or injury, pt requires aid of supportive devices such as crutches, cane, wheelchairs, walkers, the use of special transportation or the assistance of another person to leave their place of residence. There is a normal  inability to leave the home and doing so requires considerable and taxing effort. Other absences are for medical reasons / religious services and are infrequent or of short duration when for other reasons). o If current dressing causes regression in wound condition, may D/C ordered dressing product/s and apply Normal Saline Moist Dressing daily until next Wound Healing Center / Other MD appointment. Notify Wound Healing Center of regression in wound condition at [phone]. o Please direct any NON-WOUND related issues/requests for orders to patient's Primary Care Physician Electronic Signature(s) Signed: 10/31/2019 5:05:02 PM By: Lenda Kelp PA-C Entered By: Lenda Kelp on 10/31/2019 13:28:23 Krehbiel, [name] ([id number]) -------------------------------------------------------------------------------- Problem List Details Patient Name: MEAGEN, LIMONES. Date of Service: 10/31/2019 12:45 PM Medical Record Number: [id number] Patient Account Number: [id number] Date of Birth/Sex: Feb 25, 1966 (54 y.o. F) Treating RN: Francesco Sor,  Mardene Celeste Primary Care Provider: [place], JULIE Other Clinician: Referring Provider: [place], JULIE Treating Provider/Extender: Linwood Dibbles, Nguyen Todorov Weeks in Treatment: 29 Active Problems ICD-10 Encounter Code Description Active Date MDM Diagnosis L89.313 Pressure ulcer of right buttock, stage 3 04/08/2019 No Yes L89.323 Pressure ulcer of left buttock, stage 3 04/08/2019 No Yes L89.213 Pressure ulcer of right hip, stage 3 04/08/2019 No Yes G82.52 Quadriplegia, C1-C4 incomplete 04/08/2019 No Yes E44.0 Moderate protein-calorie malnutrition 04/08/2019 No Yes Inactive Problems Resolved Problems Electronic Signature(s) Signed: 10/31/2019 12:48:06 PM By: Lenda Kelp PA-C Entered By: Lenda Kelp on 10/31/2019 12:48:06 Osentoski, [name] ([id number]) -------------------------------------------------------------------------------- Progress Note Details Patient Name:  [name] Date of Service: 10/31/2019 12:45 PM Medical Record Number: [id number] Patient Account Number: [id number] Date of Birth/Sex: Dec 08, 1965 (54 y.o. F) Treating RN: Curtis Sites Primary Care Provider: [place], JULIE Other Clinician: Referring Provider: [place], JULIE Treating Provider/Extender: Linwood Dibbles, Kellyjo Edgren Weeks in Treatment: 29 Subjective Chief Complaint Information obtained from Patient Bilateral gluteal and right hip pressure ulcers History of Present Illness (HPI) 54 year old patient who was been an unreliable historian says she had completely healed in February and most recently her home health noticed that the wound on the right scapular area had reopened. She was in hospital about a month ago with pneumonia at that time a workup was done. We have no notes today but will workup the electronic medical record. on review of her records electronically I do not find any recent hospital MRI in the last 6 months. She had a CT scan of the chest in February 2018 which does not show any osseous lesions. she was admitted to the hospital between May 14 and May 17 for 3 days for mucus plugging and collapse of the left lung. at that time she had a decubitus ulcer of the right scapular region stage III. her history was noted to have quadriplegia C5-C7 incomplete, neurogenic bladder, nephrostomy catheter and emphysema. patient tells me that she has not smoked for last month and a half but as per her hospital records she was smoking in the middle of May. 01/12/17 patient scapular region appears to be doing fairly well on evaluation today. She is having really no syndicate discomfort and has been tolerating the dressing changes well complication. Readmission: 04/08/2019 patient presents today for reevaluation in our clinic concerning issues she is having with wounds on the bilateral gluteal area as well as on the right trochanter. She has unfortunately noted that things seem to have  gotten worse as she was using the Clinitron bed. The patient states it gives off a lot of heat and subsequently has not been good for her skin causing her to breakdown more. She has been trying to get back just to the air mattress but has not been able to get the company to come pick up that bed and bring out the air mattress. Her primary care provider is on this as well trying to get them to do so. Unfortunately her wounds are significantly large and show signs of hyper granulation as well. She has had this kind of issue for quite some time and unfortunately just does not seem to be showing signs of significant improvement with what she has been using currently. No fevers, chills, nausea, vomiting, or diarrhea. She does attempt appropriate offloading and again has been using the bed because that is all she has although she is not happy with it. 04/22/2019 on evaluation today patient appears to be doing about the same with regard  to her wounds at this time. Fortunately there does not appear to be any signs of active infection at this point which is good news. She has been tolerating the dressing changes without complication. Overall I am pleased with that with that being said unfortunately she does have a little bit of more significant breakdown with regard to the wound bed upon evaluation today. I believe this is secondary to the fact that she has been lying potentially too much in a single position for too long over the past several weeks. I think that potentially if she were to mitigate this to a degree that things may improve somewhat. With that being said she is really not happy with her current bed she is attempted to have them come out and switch this back to an air mattress but unfortunately has not been successful in getting them to come out and make this change. She is very frustrated with that. She still has the Clinitron bed and she much prefers just having the standard air mattress.  Patient's primary care provider still working on this she tells me. 05/08/2019 on evaluation today patient unfortunately is not doing as well as I would like to see in regard to her wounds. She actually is measuring a little bit larger pretty much at all wound locations unfortunately and overall I am very upset as well with the fact that she has been trying to get the Clinitron bed removed from her home but is not having any luck as such. We did get the number for the company today in order for [place] to see about calling them and switching this over to an air mattress as this is giving her a lot of trouble she states is really hot and she feels like it is breaking down her skin. This is not good. 05/22/2019 on evaluation today patient appears to be doing well with regard to the wounds she is showing some signs of slight improvement at this point. This is good news. Fortunately there is no evidence of active infection at this time which is also good news. No fevers, chills, nausea, vomiting, or diarrhea. She does tell me that in order to get rid of the Clinitron bed that she has been working on that the representative from the company stated they needed in order for me asked to be getting rid of it and then what we wanted her to have. Again I will be more than happy to do that we can initiate an air mattress for her. 06/06/2019 on evaluation today patient continues to have issues with her wounds. The good news is we figured out what to be done for the her bad as far as get not moved however she is getting actually ready to move and change residences. For that reason what they are going to do is come in and take the bed from the current residence to get rid of the Clinitron bed and then subsequently move the new bed into her new place of residence. This makes complete sense to me as well and I think that sounds like a good plan. At least we have a plan for what to do as far as getting things taken care of  here. 06/24/2019 on evaluation today patient appears to be doing really about the same in some regards although a couple of the wounds are measuring a little bit larger and deeper compared to previous. With that being said I do think that unfortunately her wounds seem to be  overall progressing in the wrong direction towards being worse not better. The patient is still having to use the Clinitron bed at this time I do think that she really needs to switch to just a standard air mattress that is what she wants but she is having to wait until she moves in 2 weeks before she can get the new mattress so she does not have to pay to have the bed moved twice. 07/18/2019 on evaluation today patient appears to actually be doing somewhat better in regard to her wounds in general. I am very pleased in this regard. With that being said she still has some hyper granulation unfortunately the Hydrofera Blue just was getting stuck to her and the silver cell has done better. Nonetheless I do believe that we do need to probably treat this with some silver nitrate to try to help with the hypergranulation I discussed this with the patient today. She is in agreement with this plan. OTILIA, [name] ([id number]) 07/29/2019 on evaluation today patient seems to be making some progress albeit slow regard to her wounds. Fortunately there is no signs of active infection at this time. No fever chills noted. She overall seems to be doing quite well at this point. 08/12/2019 upon evaluation today patient appears to be doing about the same at this point in regard to her wounds. She still has some hyper granulation. I really feel like she would do better with Hydrofera Blue over the alginate itself. With that being said we have not been able to use that for a while as it was sticking but again it has been sometime she was having issues with a different bed at that time as well which may have contributed to some of the issues at that  point. Fortunately there is no evidence of active infection at this time. No fevers, chills, nausea, vomiting, or diarrhea. 09/04/2019 upon evaluation today patient actually appears to be doing excellent in regard to her wounds across the board. I am extremely pleased with how things appear today. She has a lot of new epithelization, good granulation, and overall she states that she is doing very well with the current bed. Everything about today seems to be on the up and up. 09/25/2019 upon evaluation today patient appears to be doing excellent in regard to her wounds. She has been tolerating the dressing changes without complication. Fortunately there is no evidence of active infection at this time. No fevers, chills, nausea, vomiting, or diarrhea. Overall she is actually been making great progress since getting off of the air-fluidized mattress I am happy in that regard. 10/17/2019 upon evaluation today patient appears to be doing well at this point in regard to her wounds. She is making good progress at this time which is good news. Fortunately there is no signs of active infection at this time. No fevers, chills, nausea, vomiting, or diarrhea. 10/31/2019 upon evaluation today patient actually appears to be doing excellent in regard to her wounds currently. I feel like the [facility] is doing a great job. Overall very pleased with the progress she is making. 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 For about 4-5 months she's had a decubitus ulcer on the right scapular region. she was lost to follow-up for the last 6 months. she had a couple of small areas which have opened out in the region of previous scar  tissue in the sacral region but these haveall healed now. She does not have any other significant comorbidities. She has a good Roho cushion for her wheelchair and  she has a specialized mattress for sleep. She has restarted smoking and smokes about half a pack of cigarettes a day 02/18/2015 -- X-ray of the pelvis -- IMPRESSION:No acute osseous injury of the pelvis. If there is clinical concern regarding osteomyelitis, recommend further evaluation with CT. X-ray of the sacrum and coccyx IMPRESSION:No acute osseous abnormality of the sacrum. If there is further clinical concern regarding osteomyelitis of the sacrum, further evaluation with CT is recommended. ========= Objective Constitutional Thin and well-hydrated in no acute distress. Vitals Time Taken: 12:50 PM, Height: 63 in, Weight: 70 lbs, BMI: 12.4, Temperature: 98.5 F, Pulse: 78 bpm, Respiratory Rate: 16 breaths/min, Blood Pressure: 116/67 mmHg. Respiratory normal breathing without difficulty. Psychiatric this patient is able to make decisions and demonstrates good insight into disease process. Alert and Oriented x 3. pleasant and cooperative. General Notes: Patient's wound bed currently showed signs of fairly good granulation there does not appear to be any evidence of infection and overall I think she is managing quite nicely which is great news. The air mattress is doing extremely well for her as well. Integumentary (Hair, Skin) Wound #5 status is Open. Original cause of wound was Gradually Appeared. The wound is located on the Right Sacrum. The wound measures 6cm length x 6.5cm width x 0.1cm depth; 30.631cm^2 area and 3.063cm^3 volume. There is Fat Layer (Subcutaneous Tissue) Exposed exposed. There is a medium amount of serous drainage noted. The wound margin is flat and intact. There is large (67-100%) pink granulation within the wound bed. There is a small (1-33%) amount of necrotic tissue within the wound bed including Adherent Slough. Wound #7 status is Open. Original cause of wound was Gradually Appeared. The wound is located on the Right Trochanter. The wound measures 3cm length x 4cm  width x 0.3cm depth; 9.425cm^2 area and 2.827cm^3 volume. There is Fat Layer (Subcutaneous Tissue) Exposed exposed. There is a medium amount of serous drainage noted. The wound margin is flat and intact. There is large (67-100%) red granulation within the wound bed. There is a small (1-33%) amount of necrotic tissue within the wound bed including Adherent Slough. 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 May Shower, gently pat wound dry prior to applying new dressing. Wound #7 Right Trochanter: Clean wound with Normal Saline. May Shower, gently pat wound dry prior to applying new dressing. Primary Wound Dressing: Wound #5 Right Sacrum: Hydrafera Blue Ready Transfer - Please order Hydrofera Blue Ready Transfer specifically for patient r/t wound location and wet with saline or warm soapy water prior to removal Wound #7 Right Trochanter: Hydrafera Blue Ready Transfer - Please order Hydrofera Blue Ready Transfer specifically for patient r/t wound location and wet with saline or warm soapy water prior to removal Secondary Dressing: Wound #5 Right Sacrum: ABD pad - Secure with tape or bordered foam dressing Wound #7 Right Trochanter: ABD pad - Secure with tape or bordered foam dressing Dressing Change Frequency: Wound #5 Right Sacrum: Change Dressing Monday, Wednesday, Friday - or three times weekly Wound #7 Right Trochanter: Change Dressing Monday, Wednesday, Friday - or three times weekly Follow-up  Appointments: Wound #5 Right Sacrum: Return Appointment in 2 weeks. Wound #7 Right Trochanter: Return Appointment in 2 weeks. Off-Loading: Wound #5 Right Sacrum: Mattress - air mattress or fluidized  air mattress Turn and reposition every 2 hours Wound #7 Right Trochanter: Mattress - air mattress or fluidized air mattress Turn and reposition every 2 hours Additional Orders / Instructions: Wound #5 Right Sacrum: Increase protein intake. Increase protein intake. Wound #7 Right Trochanter: Increase protein intake. Increase protein intake. Home Health: Wound #5 Right Sacrum: Continue Home Health Visits - [facility] - Please provide dressing materials for patient Home Health Nurse may visit PRN to address patient s wound care needs. FACE TO FACE ENCOUNTER: MEDICARE and MEDICAID PATIENTS: I certify that this patient is under my care and that I had a face-to-face encounter that meets the physician face-to-face encounter requirements with this patient on this date. The encounter with the patient was in whole or in part for the following MEDICAL CONDITION: (primary reason for Home Healthcare) MEDICAL NECESSITY: I certify, that based on my findings, NURSING services are a medically necessary home health service. HOME BOUND STATUS: I certify that my clinical findings support that this patient is homebound (i.e., Due to illness or injury, pt requires aid of supportive devices such as crutches, cane, wheelchairs, walkers, the use of special transportation or the assistance of another person to leave their place of residence. There is a normal inability to leave the home and doing so requires considerable and taxing effort. Other absences are for medical reasons / religious services and are infrequent or of short duration when for other reasons). If current dressing causes regression in wound condition, may D/C ordered dressing product/s and apply Normal Saline Moist Dressing daily until next Wound Healing Center / Other MD appointment. Notify Wound Healing Center of regression in wound condition at [phone]. [name], [name] ([id number]) Please direct any NON-WOUND related issues/requests for  orders to patient's Primary Care Physician Wound #7 Right Trochanter: Continue Home Health Visits - [facility] - Please provide dressing materials for patient Home Health Nurse may visit PRN to address patient s wound care needs. FACE TO FACE ENCOUNTER: MEDICARE and MEDICAID PATIENTS: I certify that this patient is under my care and that I had a face-to-face encounter that meets the physician face-to-face encounter requirements with this patient on this date. The encounter with the patient was in whole or in part for the following MEDICAL CONDITION: (primary reason for Home Healthcare) MEDICAL NECESSITY: I certify, that based on my findings, NURSING services are a medically necessary home health service. HOME BOUND STATUS: I certify that my clinical findings support that this patient is homebound (i.e., Due to illness or injury, pt requires aid of supportive devices such as crutches, cane, wheelchairs, walkers, the use of special transportation or the assistance of another person to leave their place of residence. There is a normal inability to leave the home and doing so requires considerable and taxing effort. Other absences are for medical reasons / religious services and are infrequent or of short duration when for other reasons). If current dressing causes regression in wound condition, may D/C ordered dressing product/s and apply Normal Saline Moist Dressing daily until next Wound Healing Center / Other MD appointment. Notify Wound Healing Center of regression in wound condition at [phone]. Please direct any NON-WOUND related issues/requests for orders to patient's Primary Care Physician 1. My suggestion at this time is good to be that we go ahead and initiate treatment with a continuation  of the [facility] that seems to be doing well for her. 2. I am also going to suggest that we going to continue with the ABD pads to secure along with tape as that seems to be doing well. In  some areas a border foam dressing is used which is also okay. 3. I am also can recommend she continue with offloading I think the air mattress is doing a great job they do a great job keeping her off of the areas as well. We will see patient back for reevaluation in 2 weeks here in the clinic. If anything worsens or changes patient will contact our office for additional recommendations. Electronic Signature(s) Signed: 10/31/2019 1:37:00 PM By: Lenda Kelp PA-C Entered By: Lenda Kelp on 10/31/2019 13:36:59 Katich, [name] ([id number]) -------------------------------------------------------------------------------- SuperBill Details Patient Name: [name], [name] Date of Service: 10/31/2019 Medical Record Number: [id number] Patient Account Number: [id number] Date of Birth/Sex: [birth year] (54 y.o. F) Treating RN: Curtis Sites Primary Care Provider: [place], JULIE Other Clinician: Referring Provider: [place], JULIE Treating Provider/Extender: STONE III, Patricie Geeslin Weeks in Treatment: 29 Diagnosis Coding ICD-10 Codes Code Description L89.313 Pressure ulcer of right buttock, stage 3 L89.323 Pressure ulcer of left buttock, stage 3 L89.213 Pressure ulcer of right hip, stage 3 G82.52 Quadriplegia, C1-C4 incomplete E44.0 Moderate protein-calorie malnutrition Facility Procedures CPT4 Code: 78295621 Description: 99213 - WOUND CARE VISIT-LEV 3 EST PT Modifier: Quantity: 1 Physician Procedures CPT4 Code: [id number] Description: 99213 - WC PHYS LEVEL 3 - EST PT Modifier: Quantity: 1 CPT4 Code: Description: ICD-10 Diagnosis Description L89.313 Pressure ulcer of right buttock, stage 3 L89.323 Pressure ulcer of left buttock, stage 3 L89.213 Pressure ulcer of right hip, stage 3 G82.52 Quadriplegia, C1-C4 incomplete Modifier: Quantity: Electronic Signature(s) Signed: 10/31/2019 1:37:14 PM By: Lenda Kelp PA-C Entered By: Lenda Kelp on 10/31/2019 13:37:13

## 2019-11-14 ENCOUNTER — Ambulatory Visit: Payer: Medicare Other | Admitting: Physician Assistant

## 2019-11-21 ENCOUNTER — Encounter: Payer: Medicare Other | Attending: Internal Medicine | Admitting: Internal Medicine

## 2019-11-21 ENCOUNTER — Other Ambulatory Visit: Payer: Self-pay

## 2019-11-21 DIAGNOSIS — Z681 Body mass index (BMI) 19 or less, adult: Secondary | ICD-10-CM | POA: Insufficient documentation

## 2019-11-21 DIAGNOSIS — L89313 Pressure ulcer of right buttock, stage 3: Secondary | ICD-10-CM | POA: Insufficient documentation

## 2019-11-21 DIAGNOSIS — G8254 Quadriplegia, C5-C7 incomplete: Secondary | ICD-10-CM | POA: Diagnosis not present

## 2019-11-21 DIAGNOSIS — E440 Moderate protein-calorie malnutrition: Secondary | ICD-10-CM | POA: Insufficient documentation

## 2019-11-21 DIAGNOSIS — G8252 Quadriplegia, C1-C4 incomplete: Secondary | ICD-10-CM | POA: Insufficient documentation

## 2019-11-21 DIAGNOSIS — L89323 Pressure ulcer of left buttock, stage 3: Secondary | ICD-10-CM | POA: Insufficient documentation

## 2019-11-21 DIAGNOSIS — F17210 Nicotine dependence, cigarettes, uncomplicated: Secondary | ICD-10-CM | POA: Insufficient documentation

## 2019-11-21 DIAGNOSIS — L89213 Pressure ulcer of right hip, stage 3: Secondary | ICD-10-CM | POA: Insufficient documentation

## 2019-11-21 NOTE — Progress Notes (Signed)
TERRYE, DOMBROSKY ([id number]) Visit Report for 11/21/2019 Arrival Information Details Patient Name: [name], [name] Date of Service: 11/21/2019 11:00 AM Medical Record Number: [id number] Patient Account Number: [id number] Date of Birth/Sex: [birth year] (54 y.o. F) Treating RN: Curtis Sites Primary Care Zalen Sequeira: [place], [place] Other Clinician: Referring Tye Juarez: [place], [place] Treating Izaiyah Kleinman/Extender: Altamese [place] in Treatment: 32 Visit Information History Since Last Visit Added or deleted any medications: No Patient Arrived: Wheel Chair Any new allergies or adverse reactions: No Arrival Time: 11:27 Had a fall or experienced change in No Accompanied By: friend activities of daily living that may affect Transfer Assistance: Manual risk of falls: Patient Identification Verified: Yes Signs or symptoms of abuse/neglect since last visito No Secondary Verification Process Completed: Yes Hospitalized since last visit: No Patient Requires Transmission-Based Precautions: No Implantable device outside of the clinic excluding No Patient Has Alerts: No cellular tissue based products placed in the center since last visit: Has Dressing in Place as Prescribed: Yes Pain Present Now: No Electronic Signature(s) Signed: 11/21/2019 1:36:15 PM By: Dayton Martes RCP, RRT, CHT Entered By: Weyman Rodney, Lucio Edward on 11/21/2019 11:27:42 [name], [name] ([id number]) -------------------------------------------------------------------------------- Clinic Level of Care Assessment Details Patient Name: [name], [name] Date of Service: 11/21/2019 11:00 AM Medical Record Number: [id number] Patient Account Number: [id number] Date of Birth/Sex: 1966/06/22 (54 y.o. F) Treating RN: Curtis Sites Primary Care Vernita Tague: [place], [place] Other Clinician: Referring Preslie Depasquale: [place], [place] Treating Andrienne Havener/Extender: Altamese [place] in Treatment: 32 Clinic Level of  Care Assessment Items TOOL 4 Quantity Score []  - Use when only an EandM is performed on FOLLOW-UP visit 0 ASSESSMENTS - Nursing Assessment / Reassessment X - Reassessment of Co-morbidities (includes updates in patient status) 1 10 X- 1 5 Reassessment of Adherence to Treatment Plan ASSESSMENTS - Wound and Skin Assessment / Reassessment []  - Simple Wound Assessment / Reassessment - one wound 0 X- 2 5 Complex Wound Assessment / Reassessment - multiple wounds []  - 0 Dermatologic / Skin Assessment (not related to wound area) ASSESSMENTS - Focused Assessment []  - Circumferential Edema Measurements - multi extremities 0 []  - 0 Nutritional Assessment / Counseling / Intervention []  - 0 Lower Extremity Assessment (monofilament, tuning fork, pulses) []  - 0 Peripheral Arterial Disease Assessment (using hand held doppler) ASSESSMENTS - Ostomy and/or Continence Assessment and Care []  - Incontinence Assessment and Management 0 []  - 0 Ostomy Care Assessment and Management (repouching, etc.) PROCESS - Coordination of Care X - Simple Patient / Family Education for ongoing care 1 15 []  - 0 Complex (extensive) Patient / Family Education for ongoing care X- 1 10 Staff obtains , Records, Test Results / Process Orders []  - 0 Staff telephones HHA, Nursing Homes / Clarify orders / etc []  - 0 Routine Transfer to another Facility (non-emergent condition) []  - 0 Routine Hospital Admission (non-emergent condition) []  - 0 New Admissions / / Ordering NPWT, Apligraf, etc. []  - 0 Emergency Hospital Admission (emergent condition) X- 1 10 Simple Discharge Coordination []  - 0 Complex (extensive) Discharge Coordination PROCESS - Special Needs []  - Pediatric / Minor Patient Management 0 []  - 0 Isolation Patient Management []  - 0 Hearing / Language / Visual special needs []  - 0 Assessment of Community assistance (transportation, D/C planning, etc.) []  - 0 Additional  assistance / Altered mentation []  - 0 Support Surface(s) Assessment (bed, cushion, seat, etc.) INTERVENTIONS - Wound Cleansing / Measurement Kiker, Kammie M. ( ) []  - 0 Simple Wound Cleansing - one wound X-  2 5 Complex Wound Cleansing - multiple wounds X- 1 5 Wound Imaging (photographs - any number of wounds) []  - 0 Wound Tracing (instead of photographs) []  - 0 Simple Wound Measurement - one wound X- 2 5 Complex Wound Measurement - multiple wounds INTERVENTIONS - Wound Dressings X - Small Wound Dressing one or multiple wounds 2 10 []  - 0 Medium Wound Dressing one or multiple wounds []  - 0 Large Wound Dressing one or multiple wounds []  - 0 Application of Medications - topical []  - 0 Application of Medications - injection INTERVENTIONS - Miscellaneous []  - External ear exam 0 []  - 0 Specimen Collection (cultures, biopsies, blood, body fluids, etc.) []  - 0 Specimen(s) / Culture(s) sent or taken to Lab for analysis []  - 0 Patient Transfer (multiple staff / Civil Service fast streamer / Similar devices) []  - 0 Simple Staple / Suture removal (25 or less) []  - 0 Complex Staple / Suture removal (26 or more) []  - 0 Hypo / Hyperglycemic Management (close monitor of Blood Glucose) []  - 0 Ankle / Brachial Index (ABI) - do not check if billed separately X- 1 5 Vital Signs Has the patient been seen at the hospital within the last three years: Yes Total Score: 110 Level Of Care: New/Established - Level 3 Electronic Signature(s) Signed: 11/21/2019 1:36:47 PM By: Montey Hora Entered By: Montey Hora on 11/21/2019 12:02:03 [name] ([id number]) -------------------------------------------------------------------------------- Encounter Discharge Information Details Patient Name: [name], [name] Date of Service: 11/21/2019 11:00 AM Medical Record Number: [id number] Patient Account Number: [id number] Date of Birth/Sex: [birth year] (54 y.o. F) Treating RN: Montey Hora Primary Care Talon Regala: [place], JULIE Other Clinician: Referring Eligah Anello: [place], [place] Treating Lateasha Breuer/Extender: Tito Dine in Treatment: 37 Encounter Discharge Information Items Discharge Condition: Stable Ambulatory Status: Wheelchair Discharge Destination: Home Transportation: Private Auto Accompanied By: spouse Schedule Follow-up Appointment: Yes Clinical Summary of Care: Electronic Signature(s) Signed: 11/21/2019 1:36:47 PM By: Montey Hora Entered By: Montey Hora on 11/21/2019 12:02:59 Frankland, [name] ([id number]) -------------------------------------------------------------------------------- Lower Extremity Assessment Details Patient Name: [name], [name] Date of Service: 11/21/2019 11:00 AM Medical Record Number: [id number] Patient Account Number: [id number] Date of Birth/Sex: 1966/03/24 (54 y.o. F) Treating RN: Cornell Barman Primary Care Tiena Manansala: [place], JULIE Other Clinician: Referring Alexsis Branscom: [place], [place] Treating Bern Fare/Extender: Tito Dine in Treatment: 32 Electronic Signature(s) Signed: 11/21/2019 1:58:14 PM By: Gretta Cool, BSN, RN, CWS, Kim RN, BSN Entered By: Gretta Cool, BSN, RN, CWS, Kim on 11/21/2019 11:39:47 Gasparini, [name] ([id number]) -------------------------------------------------------------------------------- Multi Wound Chart Details Patient Name: [name], [name] Date of Service: 11/21/2019 11:00 AM Medical Record Number: [id number] Patient Account Number: [id number] Date of Birth/Sex: 1966/02/12 (54 y.o. F) Treating RN: Montey Hora Primary Care Ainsleigh Kakos: [place], JULIE Other Clinician: Referring Shelbylynn Walczyk: [place], [place] Treating Birtie Fellman/Extender: Tito Dine in Treatment: 32 Vital Signs Height(in): 63 Pulse(bpm): 90 Weight(lbs): 31 Blood Pressure(mmHg): 88/64 Body Mass Index(BMI): 12 Temperature(F): 98.3 Respiratory Rate(breaths/min): 16 Photos: [N/A:N/A] Wound Location: Right Sacrum  Right Trochanter N/A Wounding Event: Gradually Appeared Gradually Appeared N/A Primary Etiology: Pressure Ulcer Pressure Ulcer N/A Comorbid History: Asthma, Chronic Obstructive Asthma, Chronic Obstructive N/A Pulmonary Disease (COPD), History Pulmonary Disease (COPD), History of pressure wounds, Paraplegia of pressure wounds, Paraplegia Date Acquired: 10/16/2018 04/02/2018 N/A Weeks of Treatment: 32 32 N/A Wound Status: Open Open N/A Clustered Wound: Yes No N/A Clustered Quantity: 5 N/A N/A Measurements L x W x D (cm) 5x6.5x0.1 2x4x0.1 N/A Area (cm) : 25.525 6.283 N/A Volume (cm) : 2.553 0.628 N/A % Reduction  in Area: 45.80% 60.00% N/A % Reduction in Volume: 92.30% 95.60% N/A Classification: Category/Stage III Category/Stage III N/A Exudate Amount: Medium Medium N/A Exudate Type: Serous Serous N/A Exudate Color: amber amber N/A Wound Margin: Flat and Intact Flat and Intact N/A Granulation Amount: Large (67-100%) Large (67-100%) N/A Granulation Quality: Pink, Hyper-granulation Red, Hyper-granulation N/A Necrotic Amount: Small (1-33%) Small (1-33%) N/A Exposed Structures: Fat Layer (Subcutaneous Tissue) Fat Layer (Subcutaneous Tissue) N/A Exposed: Yes Exposed: Yes Fascia: No Fascia: No Tendon: No Tendon: No Muscle: No Muscle: No Joint: No Joint: No Bone: No Bone: No Epithelialization: Small (1-33%) Small (1-33%) N/A Treatment Notes Wound #5 (Right Sacrum) Notes hydrofera blue, abd and tape [name], Venda M. ([id number]) Wound #7 (Right Trochanter) Notes hydrofera blue, abd and tape Electronic Signature(s) Signed: 11/21/2019 12:54:43 PM By: Baltazar Najjarobson, Michael MD Entered By: Baltazar Najjarobson, Michael on 11/21/2019 12:47:55 Abundis, [name] ([id number]) -------------------------------------------------------------------------------- Multi-Disciplinary Care Plan Details Patient Name: [name], [name] Date of Service: 11/21/2019 11:00 AM Medical Record Number: [id number] Patient  Account Number: [id number] Date of Birth/Sex: 09/16/1965 (54 y.o. F) Treating RN: Curtis Sitesorthy, Joanna Primary Care Juliann Olesky: [place] Other Clinician: Referring Sunshyne Horvath: [place], [place] Treating Termaine Roupp/Extender: Altamese CarolinaOBSON, MICHAEL G Weeks in Treatment: 5432 Active Inactive Abuse / Safety / Falls / Self Care Management Nursing Diagnoses: Impaired physical mobility Goals: Patient will not develop complications from immobility Date Initiated: 04/08/2019 Target Resolution Date: 07/12/2019 Goal Status: Active Interventions: Assess fall risk on admission and as needed Notes: Nutrition Nursing Diagnoses: Potential for alteratiion in Nutrition/Potential for imbalanced nutrition Goals: Patient/caregiver agrees to and verbalizes understanding of need to use nutritional supplements and/or vitamins as prescribed Date Initiated: 04/08/2019 Target Resolution Date: 07/12/2019 Goal Status: Active Interventions: Assess patient nutrition upon admission and as needed per policy Notes: Pressure Nursing Diagnoses: Knowledge deficit related to management of pressures ulcers Goals: Patient will remain free of pressure ulcers Date Initiated: 04/08/2019 Target Resolution Date: 07/12/2019 Goal Status: Active Interventions: Assess potential for pressure ulcer upon admission and as needed Notes: Wound/Skin Impairment Nursing Diagnoses: Impaired tissue integrity Goals: Ulcer/skin breakdown will heal within 14 weeks Date Initiated: 04/08/2019 Target Resolution Date: 07/12/2019 Goal Status: Active [name], Monik M. ([id number]) Interventions: Assess patient/caregiver ability to obtain necessary supplies Assess patient/caregiver ability to perform ulcer/skin care regimen upon admission and as needed Assess ulceration(s) every visit Notes: Electronic Signature(s) Signed: 11/21/2019 1:36:47 PM By: Curtis Sitesorthy, Joanna Entered By: Curtis Sitesorthy, Joanna on 11/21/2019 12:00:36 Tanksley, [name]  ([id number]) -------------------------------------------------------------------------------- Pain Assessment Details Patient Name: [name], Kaila M. Date of Service: 11/21/2019 11:00 AM Medical Record Number: [id number] Patient Account Number: [id number] Date of Birth/Sex: 09/16/1965 (54 y.o. F) Treating RN: Huel CoventryWoody, Kim Primary Care Diem Pagnotta: [place] Other Clinician: Referring Dhrithi Riche: [place], [place] Treating Azaria Bartell/Extender: Altamese CarolinaOBSON, MICHAEL G Weeks in Treatment: 32 Active Problems Location of Pain Severity and Description of Pain Patient Has Paino Yes Site Locations Pain Location: Pain in Ulcers Rate the pain. Current Pain Level: 3 Pain Management and Medication Current Pain Management: Electronic Signature(s) Signed: 11/21/2019 1:58:14 PM By: Elliot GurneyWoody, BSN, RN, CWS, Kim RN, BSN Entered By: Elliot GurneyWoody, BSN, RN, CWS, Kim on 11/21/2019 11:35:28 [name], [name] ([id number]) -------------------------------------------------------------------------------- Patient/Caregiver Education Details Patient Name: [name], Radonna M. Date of Service: 11/21/2019 11:00 AM Medical Record Number: [id number] Patient Account Number: [id number] Date of Birth/Gender: 09/16/1965 (54 y.o. F) Treating RN: Curtis Sitesorthy, Joanna Primary Care Physician: [place], [place]UL[place] Other Clinician: Referring Physician: [place], [place]UL[place] Treating Physician/Extender: Altamese CarolinaOBSON, MICHAEL G Weeks in Treatment: 8832 Education Assessment Education Provided To: Patient and Caregiver Education Topics Provided  Nutrition: Handouts: Nutrition Methods: Explain/Verbal Responses: State content correctly Electronic Signature(s) Signed: 11/21/2019 1:36:47 PM By: Curtis Sites Entered By: Curtis Sites on 11/21/2019 12:02:19 Latouche, [name] ([id number]) -------------------------------------------------------------------------------- Wound Assessment Details Patient Name: [name], [name] Date of Service: 11/21/2019 11:00 AM Medical Record  Number: [id number] Patient Account Number: [id number] Date of Birth/Sex: 1966-02-25 (54 y.o. F) Treating RN: Huel Coventry Primary Care Oluwatobi Ruppe: [place], [place]IE Other Clinician: Referring Amaurie Schreckengost: [place], [place] Treating Azekiel Cremer/Extender: Altamese [place] in Treatment: 32 Wound Status Wound Number: 5 Primary Pressure Ulcer Etiology: Wound Location: Right Sacrum Wound Open Wounding Event: Gradually Appeared Status: Date Acquired: 10/16/2018 Comorbid Asthma, Chronic Obstructive Pulmonary Disease (COPD), Weeks Of Treatment: 32 History: History of pressure wounds, Paraplegia Clustered Wound: Yes Photos Wound Measurements Length: (cm) 5 Width: (cm) 6.5 Depth: (cm) 0.1 Clustered Quantity: 5 Area: (cm) 25.525 Volume: (cm) 2.553 % Reduction in Area: 45.8% % Reduction in Volume: 92.3% Epithelialization: Small (1-33%) Wound Description Classification: Category/Stage III Foul Wound Margin: Flat and Intact Slou Exudate Amount: Medium Exudate Type: Serous Exudate Color: amber Odor After Cleansing: No gh/Fibrino Yes Wound Bed Granulation Amount: Large (67-100%) Exposed Structure Granulation Quality: Pink, Hyper-granulation Fascia Exposed: No Necrotic Amount: Small (1-33%) Fat Layer (Subcutaneous Tissue) Exposed: Yes Necrotic Quality: Adherent Slough Tendon Exposed: No Muscle Exposed: No Joint Exposed: No Bone Exposed: No Treatment Notes Wound #5 (Right Sacrum) Notes hydrofera blue, abd and tape [name], [name] ([id number]) Electronic Signature(s) Signed: 11/21/2019 1:58:14 PM By: Elliot Gurney, BSN, RN, CWS, Kim RN, BSN Entered By: Elliot Gurney, BSN, RN, CWS, Kim on 11/21/2019 11:39:09 Prout, [name] ([id number]) -------------------------------------------------------------------------------- Wound Assessment Details Patient Name: [name], [name] Date of Service: 11/21/2019 11:00 AM Medical Record Number: [id number] Patient Account Number: [id number] Date of Birth/Sex:  1966/01/01 (54 y.o. F) Treating RN: Huel Coventry Primary Care Kaelob Persky: [place], [place]ULIE Other Clinician: Referring Zoran Yankee: [place], [place] Treating Shila Kruczek/Extender: Altamese [place] in Treatment: 32 Wound Status Wound Number: 7 Primary Pressure Ulcer Etiology: Wound Location: Right Trochanter Wound Open Wounding Event: Gradually Appeared Status: Date Acquired: 04/02/2018 Comorbid Asthma, Chronic Obstructive Pulmonary Disease (COPD), Weeks Of Treatment: 32 History: History of pressure wounds, Paraplegia Clustered Wound: No Photos Wound Measurements Length: (cm) 2 Width: (cm) 4 Depth: (cm) 0.1 Area: (cm) 6.283 Volume: (cm) 0.628 % Reduction in Area: 60% % Reduction in Volume: 95.6% Epithelialization: Small (1-33%) Wound Description Classification: Category/Stage III Wound Margin: Flat and Intact Exudate Amount: Medium Exudate Type: Serous Exudate Color: amber Foul Odor After Cleansing: No Slough/Fibrino No Wound Bed Granulation Amount: Large (67-100%) Exposed Structure Granulation Quality: Red, Hyper-granulation Fascia Exposed: No Necrotic Amount: Small (1-33%) Fat Layer (Subcutaneous Tissue) Exposed: Yes Necrotic Quality: Adherent Slough Tendon Exposed: No Muscle Exposed: No Joint Exposed: No Bone Exposed: No Treatment Notes Wound #7 (Right Trochanter) Notes hydrofera blue, abd and tape Electronic Signature(s) Signed: 11/21/2019 1:58:14 PM By: Elliot Gurney, BSN, RN, CWS, Kim RN, BSN [name], [name] ([id number]) Entered By: Elliot Gurney, BSN, RN, CWS, Kim on 11/21/2019 11:39:36 [name], [name] ([id number]) -------------------------------------------------------------------------------- Vitals Details Patient Name: [name] Masters. Date of Service: 11/21/2019 11:00 AM Medical Record Number: [id number] Patient Account Number: [id number] Date of Birth/Sex: [birth year] (54 y.o. F) Treating RN: Curtis Sites Primary Care Josedaniel Haye: [place], [place]IE Other  Clinician: Referring Candid Bovey: [place], [place] Treating Piotr Christopher/Extender: Altamese [place] in Treatment: 32 Vital Signs Time Taken: 11:25 Temperature (F): 98.3 Height (in): 63 Pulse (bpm): 90 Weight (lbs): 70 Respiratory Rate (breaths/min): 16 Body Mass Index (BMI): 12.4 Blood Pressure (mmHg): 88/64 Reference Range: 80 -  120 mg / dl Electronic Signature(s) Signed: 11/21/2019 1:36:15 PM By: Dayton Martes RCP, RRT, CHT Entered By: Dayton Martes on 11/21/2019 11:28:14

## 2019-11-21 NOTE — Progress Notes (Signed)
CAMELA, WICH ([id number]) Visit Report for 11/21/2019 HPI Details Patient Name: [name], [name] Date of Service: 11/21/2019 11:00 AM Medical Record Number: [id number] Patient Account Number: [id number] Date of Birth/Sex: 13-Jun-1966 (54 y.o. F) Treating RN: Curtis Sites Primary Care Provider: [place], JULIE Other Clinician: Referring Provider: [place], JULIE Treating Provider/Extender: Altamese [place] in Treatment: 32 History of Present Illness HPI Description: 54 year old patient who was been an unreliable historian says she had completely healed in February and most recently her home health noticed that the wound on the right scapular area had reopened. She was in hospital about a month ago with pneumonia at that time a workup was done. We have no notes today but will workup the electronic medical record. on review of her records electronically I do not find any recent hospital MRI in the last 6 months. She had a CT scan of the chest in February 2018 which does not show any osseous lesions. she was admitted to the hospital between May 14 and May 17 for 3 days for mucus plugging and collapse of the left lung. at that time she had a decubitus ulcer of the right scapular region stage III. her history was noted to have quadriplegia C5-C7 incomplete, neurogenic bladder, nephrostomy catheter and emphysema. patient tells me that she has not smoked for last month and a half but as per her hospital records she was smoking in the middle of May. 01/12/17 patient scapular region appears to be doing fairly well on evaluation today. She is having really no syndicate discomfort and has been tolerating the dressing changes well complication. Readmission: 04/08/2019 patient presents today for reevaluation in our clinic concerning issues she is having with wounds on the bilateral gluteal area as well as on the right trochanter. She has unfortunately noted that things seem to have gotten worse as  she was using the Clinitron bed. The patient states it gives off a lot of heat and subsequently has not been good for her skin causing her to breakdown more. She has been trying to get back just to the air mattress but has not been able to get the company to come pick up that bed and bring out the air mattress. Her primary care provider is on this as well trying to get them to do so. Unfortunately her wounds are significantly large and show signs of hyper granulation as well. She has had this kind of issue for quite some time and unfortunately just does not seem to be showing signs of significant improvement with what she has been using currently. No fevers, chills, nausea, vomiting, or diarrhea. She does attempt appropriate offloading and again has been using the bed because that is all she has although she is not happy with it. 04/22/2019 on evaluation today patient appears to be doing about the same with regard to her wounds at this time. Fortunately there does not appear to be any signs of active infection at this point which is good news. She has been tolerating the dressing changes without complication. Overall I am pleased with that with that being said unfortunately she does have a little bit of more significant breakdown with regard to the wound bed upon evaluation today. I believe this is secondary to the fact that she has been lying potentially too much in a single position for too long over the past several weeks. I think that potentially if she were to mitigate this to a degree that things may improve somewhat. With that being  said she is really not happy with her current bed she is attempted to have them come out and switch this back to an air mattress but unfortunately has not been successful in getting them to come out and make this change. She is very frustrated with that. She still has the Clinitron bed and she much prefers just having the standard air mattress. Patient's primary care  provider still working on this she tells me. 05/08/2019 on evaluation today patient unfortunately is not doing as well as I would like to see in regard to her wounds. She actually is measuring a little bit larger pretty much at all wound locations unfortunately and overall I am very upset as well with the fact that she has been trying to get the Clinitron bed removed from her home but is not having any luck as such. We did get the number for the company today in order for [place] to see about calling them and switching this over to an air mattress as this is giving her a lot of trouble she states is really hot and she feels like it is breaking down her skin. This is not good. 05/22/2019 on evaluation today patient appears to be doing well with regard to the wounds she is showing some signs of slight improvement at this point. This is good news. Fortunately there is no evidence of active infection at this time which is also good news. No fevers, chills, nausea, vomiting, or diarrhea. She does tell me that in order to get rid of the Clinitron bed that she has been working on that the representative from the company stated they needed in order for me asked to be getting rid of it and then what we wanted her to have. Again I will be more than happy to do that we can initiate an air mattress for her. 06/06/2019 on evaluation today patient continues to have issues with her wounds. The good news is we figured out what to be done for the her bad as far as get not moved however she is getting actually ready to move and change residences. For that reason what they are going to do is come in and take the bed from the current residence to get rid of the Clinitron bed and then subsequently move the new bed into her new place of residence. This makes complete sense to me as well and I think that sounds like a good plan. At least we have a plan for what to do as far as getting things taken care of here. 06/24/2019 on  evaluation today patient appears to be doing really about the same in some regards although a couple of the wounds are measuring a little bit larger and deeper compared to previous. With that being said I do think that unfortunately her wounds seem to be overall progressing in the wrong direction towards being worse not better. The patient is still having to use the Clinitron bed at this time I do think that she really needs to switch to just a standard air mattress that is what she wants but she is having to wait until she moves in 2 weeks before she can get the new mattress so she does not have to pay to have the bed moved twice. 07/18/2019 on evaluation today patient appears to actually be doing somewhat better in regard to her wounds in general. I am very pleased in this regard. With that being said she still has some hyper granulation unfortunately  the [facility] Blue just was getting stuck to her and the silver cell has done better. Nonetheless I do believe that we do need to probably treat this with some silver nitrate to try to help with the hypergranulation I discussed this with the patient today. She is in agreement with this plan. HEYDI, SWANGO ([id number]) 07/29/2019 on evaluation today patient seems to be making some progress albeit slow regard to her wounds. Fortunately there is no signs of active infection at this time. No fever chills noted. She overall seems to be doing quite well at this point. 08/12/2019 upon evaluation today patient appears to be doing about the same at this point in regard to her wounds. She still has some hyper granulation. I really feel like she would do better with Hydrofera Blue over the alginate itself. With that being said we have not been able to use that for a while as it was sticking but again it has been sometime she was having issues with a different bed at that time as well which may have contributed to some of the issues at that point. Fortunately  there is no evidence of active infection at this time. No fevers, chills, nausea, vomiting, or diarrhea. 09/04/2019 upon evaluation today patient actually appears to be doing excellent in regard to her wounds across the board. I am extremely pleased with how things appear today. She has a lot of new epithelization, good granulation, and overall she states that she is doing very well with the current bed. Everything about today seems to be on the up and up. 09/25/2019 upon evaluation today patient appears to be doing excellent in regard to her wounds. She has been tolerating the dressing changes without complication. Fortunately there is no evidence of active infection at this time. No fevers, chills, nausea, vomiting, or diarrhea. Overall she is actually been making great progress since getting off of the air-fluidized mattress I am happy in that regard. 10/17/2019 upon evaluation today patient appears to be doing well at this point in regard to her wounds. She is making good progress at this time which is good news. Fortunately there is no signs of active infection at this time. No fevers, chills, nausea, vomiting, or diarrhea. 10/31/2019 upon evaluation today patient actually appears to be doing excellent in regard to her wounds currently. I feel like the [facility] is doing a great job. Overall very pleased with the progress she is making. 11/21/2019; patient has wounds on her lower sacrum and right greater trochanter posteriorly. The wounds are epithelializing. No debridement was required. She has a level 3 surface for offloading. She tells [place] that she spends mostly all day in bed. She is a cervical level quadriplegia. 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 For about 4-5 months she's had a decubitus ulcer on the right scapular region. she was lost to follow-up for the  last 6 months. she had a couple of small areas which have opened out in the region of previous scar tissue in the sacral region but these haveall healed now. She does not have any other significant comorbidities. She has a good Roho cushion for her wheelchair and she has a specialized mattress for sleep. She has restarted smoking and smokes about half a pack of cigarettes a day 02/18/2015 -- X-ray of the pelvis -- IMPRESSION:No acute osseous injury of the pelvis. If there is  clinical concern regarding osteomyelitis, recommend further evaluation with CT. X-ray of the sacrum and coccyx IMPRESSION:No acute osseous abnormality of the sacrum. If there is further clinical concern regarding osteomyelitis of the sacrum, further evaluation with CT is recommended. ========= Electronic Signature(s) Signed: 11/21/2019 12:54:43 PM By: Baltazar Najjar MD Entered By: Baltazar Najjar on 11/21/2019 12:49:10 [place], [name] ([id number]) -------------------------------------------------------------------------------- Physical Exam Details Patient Name: BRYNNLEE, CUMPIAN. Date of Service: 11/21/2019 11:00 AM Medical Record Number: [id number] Patient Account Number: [id number] Date of Birth/Sex: 05/22/1966 (54 y.o. F) Treating RN: Curtis Sites Primary Care Provider: [place], JULIE Other Clinician: Referring Provider: [place], JULIE Treating Provider/Extender: Altamese [place] in Treatment: 32 Constitutional Patient is hypotensive. However looks well. Pulse regular and within target range for patient.Marland Kitchen Respirations regular, non-labored and within target range.. Temperature is normal and within the target range for the patient.. Somewhat gaunt appearing.. Gastrointestinal (GI) Abdomen is soft and non-distended without masses or tenderness. Bowel sounds active in all quadrants.. Notes Wound exam; both wound areas on the right buttock and the right hip appear to be well granulated. This is skin healing  right over bone however she will need ongoing compression relief. She has an air mattress which is fortunate. Electronic Signature(s) Signed: 11/21/2019 12:54:43 PM By: Baltazar Najjar MD Entered By: Baltazar Najjar on 11/21/2019 12:50:22 [name] ([id number]) -------------------------------------------------------------------------------- Physician Orders Details Patient Name: DETRIA, CUMMINGS. Date of Service: 11/21/2019 11:00 AM Medical Record Number: [id number] Patient Account Number: [id number] Date of Birth/Sex: 07/14/1965 (54 y.o. F) Treating RN: Curtis Sites Primary Care Provider: [place], JULIE Other Clinician: Referring Provider: [place], JULIE Treating Provider/Extender: Altamese [place] in Treatment: 32 Verbal / Phone Orders: No Diagnosis Coding Wound Cleansing Wound #5 Right Sacrum o Clean wound with Normal Saline. o May Shower, gently pat wound dry prior to applying new dressing. Wound #7 Right Trochanter o Clean wound with Normal Saline. o May Shower, gently pat wound dry prior to applying new dressing. Primary Wound Dressing Wound #5 Right Sacrum o Hydrafera Blue Ready Transfer - Please order Hydrofera Blue Ready Transfer specifically for patient r/t wound location and wet with saline or warm soapy water prior to removal Wound #7 Right Trochanter o Hydrafera Blue Ready Transfer - Please order Hydrofera Blue Ready Transfer specifically for patient r/t wound location and wet with saline or warm soapy water prior to removal Secondary Dressing Wound #5 Right Sacrum o ABD pad - Secure with tape or bordered foam dressing Wound #7 Right Trochanter o ABD pad - Secure with tape or bordered foam dressing Dressing Change Frequency Wound #5 Right Sacrum o Change Dressing Monday, Wednesday, Friday - or three times weekly Wound #7 Right Trochanter o Change Dressing Monday, Wednesday, Friday - or three times weekly Follow-up  Appointments Wound #5 Right Sacrum o Return Appointment in 2 weeks. Wound #7 Right Trochanter o Return Appointment in 2 weeks. Off-Loading Wound #5 Right Sacrum o Mattress - air mattress or fluidized air mattress o Turn and reposition every 2 hours Wound #7 Right Trochanter o Mattress - air mattress or fluidized air mattress o Turn and reposition every 2 hours Additional Orders / Instructions Wound #5 Right Sacrum o Increase protein intake. o Increase protein intake. Wound #7 Right Trochanter o Increase protein intake. DIANELY, KREHBIEL M. ([id number]) o Increase protein intake. Home Health Wound #5 Right Sacrum o Continue Home Health Visits - [facility] - Please provide dressing materials for patient o Home Health Nurse may visit PRN to address patientos wound care  needs. o FACE TO FACE ENCOUNTER: MEDICARE and MEDICAID PATIENTS: I certify that this patient is under my care and that I had a face-to-face encounter that meets the physician face-to-face encounter requirements with this patient on this date. The encounter with the patient was in whole or in part for the following MEDICAL CONDITION: (primary reason for Home Healthcare) MEDICAL NECESSITY: I certify, that based on my findings, NURSING services are a medically necessary home health service. HOME BOUND STATUS: I certify that my clinical findings support that this patient is homebound (i.e., Due to illness or injury, pt requires aid of supportive devices such as crutches, cane, wheelchairs, walkers, the use of special transportation or the assistance of another person to leave their place of residence. There is a normal inability to leave the home and doing so requires considerable and taxing effort. Other absences are for medical reasons / religious services and are infrequent or of short duration when for other reasons). o If current dressing causes regression in wound condition, may D/C ordered  dressing product/s and apply Normal Saline Moist Dressing daily until next Wound Healing Center / Other MD appointment. Notify Wound Healing Center of regression in wound condition at [phone]. o Please direct any NON-WOUND related issues/requests for orders to patient's Primary Care Physician Wound #7 Right Trochanter o Continue Home Health Visits - [facility] - Please provide dressing materials for patient o Home Health Nurse may visit PRN to address patientos wound care needs. o FACE TO FACE ENCOUNTER: MEDICARE and MEDICAID PATIENTS: I certify that this patient is under my care and that I had a face-to-face encounter that meets the physician face-to-face encounter requirements with this patient on this date. The encounter with the patient was in whole or in part for the following MEDICAL CONDITION: (primary reason for Home Healthcare) MEDICAL NECESSITY: I certify, that based on my findings, NURSING services are a medically necessary home health service. HOME BOUND STATUS: I certify that my clinical findings support that this patient is homebound (i.e., Due to illness or injury, pt requires aid of supportive devices such as crutches, cane, wheelchairs, walkers, the use of special transportation or the assistance of another person to leave their place of residence. There is a normal inability to leave the home and doing so requires considerable and taxing effort. Other absences are for medical reasons / religious services and are infrequent or of short duration when for other reasons). o If current dressing causes regression in wound condition, may D/C ordered dressing product/s and apply Normal Saline Moist Dressing daily until next Wound Healing Center / Other MD appointment. Notify Wound Healing Center of regression in wound condition at [phone]. o Please direct any NON-WOUND related issues/requests for orders to patient's Primary Care Physician Electronic  Signature(s) Signed: 11/21/2019 12:54:43 PM By: Baltazar Najjarobson, Montanna Mcbain MD Signed: 11/21/2019 1:36:47 PM By: Curtis Sitesorthy, Joanna Entered By: Curtis Sitesorthy, Joanna on 11/21/2019 12:01:14 [place], [name] ([id number]) -------------------------------------------------------------------------------- Problem List Details Patient Name: [name], Evangelina M. Date of Service: 11/21/2019 11:00 AM Medical Record Number: [id number] Patient Account Number: [id number] Date of Birth/Sex: 1965-10-16 (54 y.o. F) Treating RN: Curtis Sitesorthy, Joanna Primary Care Provider: [place], JULIE Other Clinician: Referring Provider: [place], JULIE Treating Provider/Extender: Altamese CarolinaOBSON, Kendalyn Cran[place] G Weeks in Treatment: 1632 Active Problems ICD-10 Encounter Code Description Active Date MDM Diagnosis L89.313 Pressure ulcer of right buttock, stage 3 04/08/2019 No Yes L89.323 Pressure ulcer of left buttock, stage 3 04/08/2019 No Yes L89.213 Pressure ulcer of right hip, stage 3 04/08/2019 No Yes G82.52 Quadriplegia, C1-C4  incomplete 04/08/2019 No Yes E44.0 Moderate protein-calorie malnutrition 04/08/2019 No Yes Inactive Problems Resolved Problems Electronic Signature(s) Signed: 11/21/2019 12:54:43 PM By: Linton Ham MD Entered By: Linton Ham on 11/21/2019 12:47:40 Lafalce, [name] ([id number]) -------------------------------------------------------------------------------- Progress Note Details Patient Name: VENOLA, CASTELLO. Date of Service: 11/21/2019 11:00 AM Medical Record Number: [id number] Patient Account Number: [id number] Date of Birth/Sex: Nov 28, 1965 (54 y.o. F) Treating RN: Montey Hora Primary Care Provider: [place], JULIE Other Clinician: Referring Provider: [place], JULIE Treating Provider/Extender: Tito Dine in Treatment: 32 Subjective History of Present Illness (HPI) 54 year old patient who was been an unreliable historian says she had completely healed in February and most recently her home health noticed that the  wound on the right scapular area had reopened. She was in hospital about a month ago with pneumonia at that time a workup was done. We have no notes today but will workup the electronic medical record. on review of her records electronically I do not find any recent hospital MRI in the last 6 months. She had a CT scan of the chest in February 2018 which does not show any osseous lesions. she was admitted to the hospital between May 14 and May 17 for 3 days for mucus plugging and collapse of the left lung. at that time she had a decubitus ulcer of the right scapular region stage III. her history was noted to have quadriplegia C5-C7 incomplete, neurogenic bladder, nephrostomy catheter and emphysema. patient tells me that she has not smoked for last month and a half but as per her hospital records she was smoking in the middle of May. 01/12/17 patient scapular region appears to be doing fairly well on evaluation today. She is having really no syndicate discomfort and has been tolerating the dressing changes well complication. Readmission: 04/08/2019 patient presents today for reevaluation in our clinic concerning issues she is having with wounds on the bilateral gluteal area as well as on the right trochanter. She has unfortunately noted that things seem to have gotten worse as she was using the Clinitron bed. The patient states it gives off a lot of heat and subsequently has not been good for her skin causing her to breakdown more. She has been trying to get back just to the air mattress but has not been able to get the company to come pick up that bed and bring out the air mattress. Her primary care provider is on this as well trying to get them to do so. Unfortunately her wounds are significantly large and show signs of hyper granulation as well. She has had this kind of issue for quite some time and unfortunately just does not seem to be showing signs of significant improvement with what she has been  using currently. No fevers, chills, nausea, vomiting, or diarrhea. She does attempt appropriate offloading and again has been using the bed because that is all she has although she is not happy with it. 04/22/2019 on evaluation today patient appears to be doing about the same with regard to her wounds at this time. Fortunately there does not appear to be any signs of active infection at this point which is good news. She has been tolerating the dressing changes without complication. Overall I am pleased with that with that being said unfortunately she does have a little bit of more significant breakdown with regard to the wound bed upon evaluation today. I believe this is secondary to the fact that she has been lying potentially too much in a  single position for too long over the past several weeks. I think that potentially if she were to mitigate this to a degree that things may improve somewhat. With that being said she is really not happy with her current bed she is attempted to have them come out and switch this back to an air mattress but unfortunately has not been successful in getting them to come out and make this change. She is very frustrated with that. She still has the Clinitron bed and she much prefers just having the standard air mattress. Patient's primary care provider still working on this she tells me. 05/08/2019 on evaluation today patient unfortunately is not doing as well as I would like to see in regard to her wounds. She actually is measuring a little bit larger pretty much at all wound locations unfortunately and overall I am very upset as well with the fact that she has been trying to get the Clinitron bed removed from her home but is not having any luck as such. We did get the number for the company today in order for [place] to see about calling them and switching this over to an air mattress as this is giving her a lot of trouble she states is really hot and she feels like it  is breaking down her skin. This is not good. 05/22/2019 on evaluation today patient appears to be doing well with regard to the wounds she is showing some signs of slight improvement at this point. This is good news. Fortunately there is no evidence of active infection at this time which is also good news. No fevers, chills, nausea, vomiting, or diarrhea. She does tell me that in order to get rid of the Clinitron bed that she has been working on that the representative from the company stated they needed in order for me asked to be getting rid of it and then what we wanted her to have. Again I will be more than happy to do that we can initiate an air mattress for her. 06/06/2019 on evaluation today patient continues to have issues with her wounds. The good news is we figured out what to be done for the her bad as far as get not moved however she is getting actually ready to move and change residences. For that reason what they are going to do is come in and take the bed from the current residence to get rid of the Cl[place]ron bed and then subsequently move the new bed into her new place of residence. This makes complete sense to me as well and I think that sounds like a good plan. At least we have a plan for what to do as far as getting things taken care of here. 06/24/2019 on evaluation today patient appears to be doing really about the same in some regards although a couple of the wounds are measuring a little bit larger and deeper compared to previous. With that being said I do think that unfortunately her wounds seem to be overall progressing in the wrong direction towards being worse not better. The patient is still having to use the Cl[place]ron bed at this time I do think that she really needs to switch to just a standard air mattress that is what she wants but she is having to wait until she moves in 2 weeks before she can get the new mattress so she does not have to pay to have the bed moved  twice. 07/18/2019 on evaluation today patient appears  to actually be doing somewhat better in regard to her wounds in general. I am very pleased in this regard. With that being said she still has some hyper granulation unfortunately the Hydrofera Blue just was getting stuck to her and the silver cell has done better. Nonetheless I do believe that we do need to probably treat this with some silver nitrate to try to help with the hypergranulation I discussed this with the patient today. She is in agreement with this plan. 07/29/2019 on evaluation today patient seems to be making some progress albeit slow regard to her wounds. Fortunately there is no signs of active infection at this time. No fever chills noted. She overall seems to be doing quite well at this point. 08/12/2019 upon evaluation today patient appears to be doing about the same at this point in regard to her wounds. She still has some hyper [place]av[place], Briannah M. ([id number]) granulation. I really feel like she would do better with Hydrofera Blue over the alginate itself. With that being said we have not been able to use that for a while as it was sticking but again it has been sometime she was having issues with a different bed at that time as well which may have contributed to some of the issues at that point. Fortunately there is no evidence of active infection at this time. No fevers, chills, nausea, vomiting, or diarrhea. 09/04/2019 upon evaluation today patient actually appears to be doing excellent in regard to her wounds across the board. I am extremely pleased with how things appear today. She has a lot of new epithelization, good granulation, and overall she states that she is doing very well with the current bed. Everything about today seems to be on the up and up. 09/25/2019 upon evaluation today patient appears to be doing excellent in regard to her wounds. She has been tolerating the dressing changes without complication. Fortunately  there is no evidence of active infection at this time. No fevers, chills, nausea, vomiting, or diarrhea. Overall she is actually been making great progress since getting off of the air-fluidized mattress I am happy in that regard. 10/17/2019 upon evaluation today patient appears to be doing well at this point in regard to her wounds. She is making good progress at this time which is good news. Fortunately there is no signs of active infection at this time. No fevers, chills, nausea, vomiting, or diarrhea. 10/31/2019 upon evaluation today patient actually appears to be doing excellent in regard to her wounds currently. I feel like the [facility] is doing a great job. Overall very pleased with the progress she is making. 11/21/2019; patient has wounds on her lower sacrum and right greater trochanter posteriorly. The wounds are epithelializing. No debridement was required. She has a level 3 surface for offloading. She tells [place] that she spends mostly all day in bed. She is a cervical level quadriplegia. 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 For about 4-5 months she's had a decubitus ulcer on the right scapular region. she was lost to follow-up for the last 6 months. she had a couple of small areas which have opened out in the region of previous scar tissue in the sacral region but these haveall healed now. She does not have any other significant comorbidities. She has a good Roho cushion for her wheelchair and she has a specialized mattress for sleep. She  has restarted smoking and smokes about half a pack of cigarettes a day 02/18/2015 -- X-ray of the pelvis -- IMPRESSION:No acute osseous injury of the pelvis. If there is clinical concern regarding osteomyelitis, recommend further evaluation with CT. X-ray of the sacrum and coccyx IMPRESSION:No acute osseous abnormality  of the sacrum. If there is further clinical concern regarding osteomyelitis of the sacrum, further evaluation with CT is recommended. ========= Objective Constitutional Patient is hypotensive. However looks well. Pulse regular and within target range for patient.Marland Kitchen Respirations regular, non-labored and within target range.. Temperature is normal and within the target range for the patient.. Somewhat gaunt appearing.. Vitals Time Taken: 11:25 AM, Height: 63 in, Weight: 70 lbs, BMI: 12.4, Temperature: 98.3 F, Pulse: 90 bpm, Respiratory Rate: 16 breaths/min, Blood Pressure: 88/64 mmHg. Gastrointestinal (GI) Abdomen is soft and non-distended without masses or tenderness. Bowel sounds active in all quadrants.. General Notes: Wound exam; both wound areas on the right buttock and the right hip appear to be well granulated. This is skin healing right over bone however she will need ongoing compression relief. She has an air mattress which is fortunate. Integumentary (Hair, Skin) Wound #5 status is Open. Original cause of wound was Gradually Appeared. The wound is located on the Right Sacrum. The wound measures 5cm length x 6.5cm width x 0.1cm depth; 25.525cm^2 area and 2.553cm^3 volume. There is Fat Layer (Subcutaneous Tissue) Exposed exposed. There is a medium amount of serous drainage noted. The wound margin is flat and intact. There is large (67-100%) pink, hyper - granulation within the wound bed. There is a small (1-33%) amount of necrotic tissue within the wound bed including Adherent Slough. Wound #7 status is Open. Original cause of wound was Gradually Appeared. The wound is located on the Right Trochanter. The wound measures 2cm length x 4cm width x 0.1cm depth; 6.283cm^2 area and 0.628cm^3 volume. There is Fat Layer (Subcutaneous Tissue) Exposed exposed. There is a medium amount of serous drainage noted. The wound margin is flat and intact. There is large (67-100%) red, hyper -  granulation within the wound bed. There is a small (1-33%) amount of necrotic tissue within the wound bed including Adherent Slough. Assessment CLORA, OHMER. 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 May Shower, gently pat wound dry prior to applying new dressing. Wound #7 Right Trochanter: Clean wound with Normal Saline. May Shower, gently pat wound dry prior to applying new dressing. Primary Wound Dressing: Wound #5 Right Sacrum: Hydrafera Blue Ready Transfer - Please order Hydrofera Blue Ready Transfer specifically for patient r/t wound location and wet with saline or warm soapy water prior to removal Wound #7 Right Trochanter: Hydrafera Blue Ready Transfer - Please order Hydrofera Blue Ready Transfer specifically for patient r/t wound location and wet with saline or warm soapy water prior to removal Secondary Dressing: Wound #5 Right Sacrum: ABD pad - Secure with tape or bordered foam dressing Wound #7 Right Trochanter: ABD pad - Secure with tape or bordered foam dressing Dressing Change Frequency: Wound #5 Right Sacrum: Change Dressing Monday, Wednesday, Friday - or three times weekly Wound #7 Right Trochanter: Change Dressing Monday, Wednesday, Friday - or three times weekly Follow-up Appointments: Wound #5 Right Sacrum: Return Appointment in 2 weeks. Wound #7 Right Trochanter: Return Appointment in 2 weeks.  Off-Loading: Wound #5 Right Sacrum: Mattress - air mattress or fluidized air mattress Turn and reposition every 2 hours Wound #7 Right Trochanter: Mattress - air mattress or fluidized air mattress Turn and reposition every 2 hours Additional Orders / Instructions: Wound #5 Right Sacrum: Increase  protein intake. Increase protein intake. Wound #7 Right Trochanter: Increase protein intake. Increase protein intake. Home Health: Wound #5 Right Sacrum: Continue Home Health Visits - [facility] - Please provide dressing materials for patient Home Health Nurse may visit PRN to address patient s wound care needs. FACE TO FACE ENCOUNTER: MEDICARE and MEDICAID PATIENTS: I certify that this patient is under my care and that I had a face-to-face encounter that meets the physician face-to-face encounter requirements with this patient on this date. The encounter with the patient was in whole or in part for the following MEDICAL CONDITION: (primary reason for Home Healthcare) MEDICAL NECESSITY: I certify, that based on my findings, NURSING services are a medically necessary home health service. HOME BOUND STATUS: I certify that my clinical findings support that this patient is homebound (i.e., Due to illness or injury, pt requires aid of supportive devices such as crutches, cane, wheelchairs, walkers, the use of special transportation or the assistance of another person to leave their place of residence. There is a normal inability to leave the home and doing so requires considerable and taxing effort. Other absences are for medical reasons / religious services and are infrequent or of short duration when for other reasons). If current dressing causes regression in wound condition, may D/C ordered dressing product/s and apply Normal Saline Moist Dressing daily until next Wound Healing Center / Other MD appointment. Notify Wound Healing Center of regression in wound condition at [phone]. Please direct any NON-WOUND related issues/requests for orders to patient's Primary Care Physician Wound #7 Right Trochanter: Continue Home Health Visits - [facility] - Please provide dressing materials for patient Home Health Nurse may visit PRN to address patient s wound care needs. FACE TO FACE ENCOUNTER: MEDICARE  and MEDICAID PATIENTS: I certify that this patient is under my care and that I had a face-to-face DALAYA, SUPPA. ([id number]) encounter that meets the physician face-to-face encounter requirements with this patient on this date. The encounter with the patient was in whole or in part for the following MEDICAL CONDITION: (primary reason for Home Healthcare) MEDICAL NECESSITY: I certify, that based on my findings, NURSING services are a medically necessary home health service. HOME BOUND STATUS: I certify that my clinical findings support that this patient is homebound (i.e., Due to illness or injury, pt requires aid of supportive devices such as crutches, cane, wheelchairs, walkers, the use of special transportation or the assistance of another person to leave their place of residence. There is a normal inability to leave the home and doing so requires considerable and taxing effort. Other absences are for medical reasons / religious services and are infrequent or of short duration when for other reasons). If current dressing causes regression in wound condition, may D/C ordered dressing product/s and apply Normal Saline Moist Dressing daily until next Wound Healing Center / Other MD appointment. Notify Wound Healing Center of regression in wound condition at [phone]. Please direct any NON-WOUND related issues/requests for orders to patient's Primary Care Physician 1. 2 large wound areas on the right sacrum and the right posterior trochanter. Both of these appear to be healthy granulation with advancing epithelialization. However the epithelialization is occurring right over bone. I suspect she has had wounds  in this area before her these are very chronic. No evidence of infection. 2. No need to change the primary dressing which is Hydrofera Blue/ABDs 3. We talked about nutritional intake. This has been an ongoing issue for this patient. Electronic Signature(s) Signed: 11/21/2019 12:54:43 PM  By: Baltazar Najjar MD Entered By: Baltazar Najjar on 11/21/2019 12:51:51 Mcinerney, [name] ([id number]) -------------------------------------------------------------------------------- SuperBill Details Patient Name: LESLEYANNE, POLITTE. Date of Service: 11/21/2019 Medical Record Number: [id number] Patient Account Number: [id number] Date of Birth/Sex: 1966/04/06 (54 y.o. F) Treating RN: Curtis Sites Primary Care Provider: [place], JULIE Other Clinician: Referring Provider: [place], JULIE Treating Provider/Extender: Altamese [place] in Treatment: 32 Diagnosis Coding ICD-10 Codes Code Description L89.313 Pressure ulcer of right buttock, stage 3 L89.323 Pressure ulcer of left buttock, stage 3 L89.213 Pressure ulcer of right hip, stage 3 G82.52 Quadriplegia, C1-C4 incomplete E44.0 Moderate protein-calorie malnutrition Facility Procedures CPT4 Code: 87564332 Description: 99213 - WOUND CARE VISIT-LEV 3 EST PT Modifier: Quantity: 1 Physician Procedures CPT4 Code: [id number] Description: 99213 - WC PHYS LEVEL 3 - EST PT Modifier: Quantity: 1 CPT4 Code: Description: ICD-10 Diagnosis Description L89.313 Pressure ulcer of right buttock, stage 3 L89.213 Pressure ulcer of right hip, stage 3 Modifier: Quantity: Electronic Signature(s) Signed: 11/21/2019 12:54:43 PM By: Baltazar Najjar MD Entered By: Baltazar Najjar on 11/21/2019 12:52:25

## 2019-12-05 ENCOUNTER — Ambulatory Visit: Payer: Medicare Other | Admitting: Physician Assistant

## 2019-12-09 ENCOUNTER — Other Ambulatory Visit: Payer: Self-pay

## 2019-12-09 ENCOUNTER — Encounter: Payer: Medicare Other | Attending: Physician Assistant | Admitting: Physician Assistant

## 2019-12-09 DIAGNOSIS — E440 Moderate protein-calorie malnutrition: Secondary | ICD-10-CM | POA: Diagnosis not present

## 2019-12-09 DIAGNOSIS — Z681 Body mass index (BMI) 19 or less, adult: Secondary | ICD-10-CM | POA: Insufficient documentation

## 2019-12-09 DIAGNOSIS — G8252 Quadriplegia, C1-C4 incomplete: Secondary | ICD-10-CM | POA: Insufficient documentation

## 2019-12-09 DIAGNOSIS — L89313 Pressure ulcer of right buttock, stage 3: Secondary | ICD-10-CM | POA: Insufficient documentation

## 2019-12-09 DIAGNOSIS — F17210 Nicotine dependence, cigarettes, uncomplicated: Secondary | ICD-10-CM | POA: Insufficient documentation

## 2019-12-09 DIAGNOSIS — L89213 Pressure ulcer of right hip, stage 3: Secondary | ICD-10-CM | POA: Insufficient documentation

## 2019-12-09 NOTE — Progress Notes (Addendum)
[name] ([id number]) Visit Report for 12/09/2019 Chief Complaint Document Details Patient Name: [name], [name] Date of Service: 12/09/2019 1:15 PM Medical Record Number: [id number] Patient Account Number: [id number] Date of Birth/Sex: [birth year] (54 y.o. F) Treating RN: [name] Primary Care Provider: [place], [name] Other Clinician: Referring Provider: [place], [name] Treating Provider/Extender: [name] Weeks in Treatment: 35 Information Obtained from: Patient Chief Complaint Bilateral gluteal and right hip pressure ulcers Electronic Signature(s) Signed: 12/09/2019 1:41:16 PM By: [name] Kelp PA-C Entered By: [name] on 12/09/2019 13:41:16 [name] ([id number]) -------------------------------------------------------------------------------- HPI Details Patient Name: [name], [name] Date of Service: 12/09/2019 1:15 PM Medical Record Number: [id number] Patient Account Number: [id number] Date of Birth/Sex: 03/24/1966 (54 y.o. F) Treating RN: [name] Primary Care Provider: [place], [name] Other Clinician: Referring Provider: [place], [name] Treating Provider/Extender: [name] Weeks in Treatment: 35 History of Present Illness HPI Description: 54 year old patient who was been an unreliable historian says she had completely healed in February and most recently her home health noticed that the wound on the right scapular area had reopened. She was in hospital about a month ago with pneumonia at that time a workup was done. We have no notes today but will workup the electronic medical record. on review of her records electronically I do not find any recent hospital MRI in the last 6 months. She had a CT scan of the chest in February 2018 which does not show any osseous lesions. she was admitted to the hospital between May 14 and May 17 for 3 days for mucus plugging and collapse of the left lung. at that time she had a decubitus ulcer of the  right scapular region stage III. her history was noted to have quadriplegia C5-C7 incomplete, neurogenic bladder, nephrostomy catheter and emphysema. patient tells me that she has not smoked for last month and a half but as per her hospital records she was smoking in the middle of May. 01/12/17 patient scapular region appears to be doing fairly well on evaluation today. She is having really no syndicate discomfort and has been tolerating the dressing changes well complication. Readmission: 04/08/2019 patient presents today for reevaluation in our clinic concerning issues she is having with wounds on the bilateral gluteal area as well as on the right trochanter. She has unfortunately noted that things seem to have gotten worse as she was using the Clinitron bed. The patient states it gives off a lot of heat and subsequently has not been good for her skin causing her to breakdown more. She has been trying to get back just to the air mattress but has not been able to get the company to come pick up that bed and bring out the air mattress. Her primary care provider is on this as well trying to get them to do so. Unfortunately her wounds are significantly large and show signs of hyper granulation as well. She has had this kind of issue for quite some time and unfortunately just does not seem to be showing signs of significant improvement with what she has been using currently. No fevers, chills, nausea, vomiting, or diarrhea. She does attempt appropriate offloading and again has been using the bed because that is all she has although she is not happy with it. 04/22/2019 on evaluation today patient appears to be doing about the same with regard to her wounds at this time. Fortunately there does not appear to be any signs of active infection at this point which  is good news. She has been tolerating the dressing changes without complication. Overall I am pleased with that with that being said unfortunately she  does have a little bit of more significant breakdown with regard to the wound bed upon evaluation today. I believe this is secondary to the fact that she has been lying potentially too much in a single position for too long over the past several weeks. I think that potentially if she were to mitigate this to a degree that things may improve somewhat. With that being said she is really not happy with her current bed she is attempted to have them come out and switch this back to an air mattress but unfortunately has not been successful in getting them to come out and make this change. She is very frustrated with that. She still has the Clinitron bed and she much prefers just having the standard air mattress. Patient's primary care provider still working on this she tells me. 05/08/2019 on evaluation today patient unfortunately is not doing as well as I would like to see in regard to her wounds. She actually is measuring a little bit larger pretty much at all wound locations unfortunately and overall I am very upset as well with the fact that she has been trying to get the Clinitron bed removed from her home but is not having any luck as such. We did get the number for the company today in order for [place] to see about calling them and switching this over to an air mattress as this is giving her a lot of trouble she states is really hot and she feels like it is breaking down her skin. This is not good. 05/22/2019 on evaluation today patient appears to be doing well with regard to the wounds she is showing some signs of slight improvement at this point. This is good news. Fortunately there is no evidence of active infection at this time which is also good news. No fevers, chills, nausea, vomiting, or diarrhea. She does tell me that in order to get rid of the Clinitron bed that she has been working on that the representative from the company stated they needed in order for me asked to be getting rid of it and  then what we wanted her to have. Again I will be more than happy to do that we can initiate an air mattress for her. 06/06/2019 on evaluation today patient continues to have issues with her wounds. The good news is we figured out what to be done for the her bad as far as get not moved however she is getting actually ready to move and change residences. For that reason what they are going to do is come in and take the bed from the current residence to get rid of the Clinitron bed and then subsequently move the new bed into her new place of residence. This makes complete sense to me as well and I think that sounds like a good plan. At least we have a plan for what to do as far as getting things taken care of here. 06/24/2019 on evaluation today patient appears to be doing really about the same in some regards although a couple of the wounds are measuring a little bit larger and deeper compared to previous. With that being said I do think that unfortunately her wounds seem to be overall progressing in the wrong direction towards being worse not better. The patient is still having to use the Clinitron bed at  this time I do think that she really needs to switch to just a standard air mattress that is what she wants but she is having to wait until she moves in 2 weeks before she can get the new mattress so she does not have to pay to have the bed moved twice. 07/18/2019 on evaluation today patient appears to actually be doing somewhat better in regard to her wounds in general. I am very pleased in this regard. With that being said she still has some hyper granulation unfortunately the Hydrofera Blue just was getting stuck to her and the silver cell has done better. Nonetheless I do believe that we do need to probably treat this with some silver nitrate to try to help with the hypergranulation I discussed this with the patient today. She is in agreement with this plan. 07/29/2019 on evaluation today patient  seems to be making some progress albeit slow regard to her wounds. Fortunately there is no signs of active infection at this time. No fever chills noted. She overall seems to be doing quite well at this point. [name], [name] ([id number]) 08/12/2019 upon evaluation today patient appears to be doing about the same at this point in regard to her wounds. She still has some hyper granulation. I really feel like she would do better with Hydrofera Blue over the alginate itself. With that being said we have not been able to use that for a while as it was sticking but again it has been sometime she was having issues with a different bed at that time as well which may have contributed to some of the issues at that point. Fortunately there is no evidence of active infection at this time. No fevers, chills, nausea, vomiting, or diarrhea. 09/04/2019 upon evaluation today patient actually appears to be doing excellent in regard to her wounds across the board. I am extremely pleased with how things appear today. She has a lot of new epithelization, good granulation, and overall she states that she is doing very well with the current bed. Everything about today seems to be on the up and up. 09/25/2019 upon evaluation today patient appears to be doing excellent in regard to her wounds. She has been tolerating the dressing changes without complication. Fortunately there is no evidence of active infection at this time. No fevers, chills, nausea, vomiting, or diarrhea. Overall she is actually been making great progress since getting off of the air-fluidized mattress I am happy in that regard. 10/17/2019 upon evaluation today patient appears to be doing well at this point in regard to her wounds. She is making good progress at this time which is good news. Fortunately there is no signs of active infection at this time. No fevers, chills, nausea, vomiting, or diarrhea. 10/31/2019 upon evaluation today patient actually appears  to be doing excellent in regard to her wounds currently. I feel like the [facility] is doing a great job. Overall very pleased with the progress she is making. 11/21/2019; patient has wounds on her lower sacrum and right greater trochanter posteriorly. The wounds are epithelializing. No debridement was required. She has a level 3 surface for offloading. She tells [place] that she spends mostly all day in bed. She is a cervical level quadriplegia. 12/09/2019 upon evaluation today patient appears to be doing excellent in regard to her wounds. She has been tolerating the dressing changes without complication. Fortunately there does not appear to be any signs of active infection which is great news and overall  I am extremely pleased. I think her wounds are getting closer to complete resolution. 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 For about 4-5 months she's had a decubitus ulcer on the right scapular region. she was lost to follow-up for the last 6 months. she had a couple of small areas which have opened out in the region of previous scar tissue in the sacral region but these haveall healed now. She does not have any other significant comorbidities. She has a good Roho cushion for her wheelchair and she has a specialized mattress for sleep. She has restarted smoking and smokes about half a pack of cigarettes a day 02/18/2015 -- X-ray of the pelvis -- IMPRESSION:No acute osseous injury of the pelvis. If there is clinical concern regarding osteomyelitis, recommend further evaluation with CT. X-ray of the sacrum and coccyx IMPRESSION:No acute osseous abnormality of the sacrum. If there is further clinical concern regarding osteomyelitis of the sacrum, further evaluation with CT is recommended. ========= Electronic Signature(s) Signed: 12/09/2019 1:46:00 PM By: [name] KelpStone III, Demetrica Zipp  PA-C Entered By: [name] KelpStone III, Cordarryl Monrreal on 12/09/2019 13:45:59 [name], [name] ([id number]) -------------------------------------------------------------------------------- Physical Exam Details Patient Name: [name] MastersWADE, [name] Date of Service: 12/09/2019 1:15 PM Medical Record Number: [id number] Patient Account Number: [id number] Date of Birth/Sex: 26-Apr-1966 (54 y.o. F) Treating RN: [name] Sitesorthy, Joanna Primary Care Provider: [place], [name] Other Clinician: Referring Provider: [place], [name] Treating Provider/Extender: [name] III, Ginger Leeth Weeks in Treatment: 35 Constitutional Well-nourished and well-hydrated in no acute distress. Respiratory normal breathing without difficulty. Psychiatric this patient is able to make decisions and demonstrates good insight into disease process. Alert and Oriented x 3. pleasant and cooperative. Notes His wound bed currently showed signs of good epithelization and granulation there was no significant slough buildup in any location no sharp debridement necessary and overall I feel like she is doing quite well. Electronic Signature(s) Signed: 12/09/2019 1:46:15 PM By: [name] KelpStone III, Ophia Shamoon PA-C Entered By: [name] KelpStone III, Debara Kamphuis on 12/09/2019 13:46:14 [place], [name] ([id number]) -------------------------------------------------------------------------------- Physician Orders Details Patient Name: [name] MastersWADE, [name] Date of Service: 12/09/2019 1:15 PM Medical Record Number: [id number] Patient Account Number: [id number] Date of Birth/Sex: 26-Apr-1966 (54 y.o. F) Treating RN: [name] Sitesorthy, Joanna Primary Care Provider: [place], [name] Other Clinician: Referring Provider: [place], [name] Treating Provider/Extender: [name] DibblesSTONE III, Yannick Steuber Weeks in Treatment: 7435 Verbal / Phone Orders: No Diagnosis Coding ICD-10 Coding Code Description L89.313 Pressure ulcer of right buttock, stage 3 L89.323 Pressure ulcer of left buttock, stage 3 L89.213 Pressure ulcer of right hip, stage 3 G82.52 Quadriplegia,  C1-C4 incomplete E44.0 Moderate protein-calorie malnutrition Wound Cleansing Wound #5 Right Sacrum o Clean wound with Normal Saline. o May Shower, gently pat wound dry prior to applying new dressing. Wound #7 Right Trochanter o Clean wound with Normal Saline. o May Shower, gently pat wound dry prior to applying new dressing. Primary Wound Dressing Wound #5 Right Sacrum o Hydrafera Blue Ready Transfer - Please order Hydrofera Blue Ready Transfer specifically for patient r/t wound location and wet with saline or warm soapy water prior to removal Wound #7 Right Trochanter o Hydrafera Blue Ready Transfer - Please order Hydrofera Blue Ready Transfer specifically for patient r/t wound location and wet with saline or warm soapy water prior to removal Secondary Dressing Wound #5 Right Sacrum o ABD pad - Secure with tape or bordered foam dressing Wound #7 Right Trochanter o ABD pad - Secure with tape  or bordered foam dressing Dressing Change Frequency Wound #5 Right Sacrum o Change Dressing Monday, Wednesday, Friday - or three times weekly Wound #7 Right Trochanter o Change Dressing Monday, Wednesday, Friday - or three times weekly Follow-up Appointments Wound #5 Right Sacrum o Return Appointment in 2 weeks. Wound #7 Right Trochanter o Return Appointment in 2 weeks. Off-Loading Wound #5 Right Sacrum o Mattress - air mattress or fluidized air mattress o Turn and reposition every 2 hours Wound #7 Right Trochanter [name], [name] ([id number]) o Mattress - air mattress or fluidized air mattress o Turn and reposition every 2 hours Additional Orders / Instructions Wound #5 Right Sacrum o Increase protein intake. o Increase protein intake. Wound #7 Right Trochanter o Increase protein intake. o Increase protein intake. Home Health Wound #5 Right [place] Visits - [facility] - Please provide dressing materials for  patient o Home Health Nurse may visit PRN to address patientos wound care needs. o FACE TO FACE ENCOUNTER: MEDICARE and MEDICAID PATIENTS: I certify that this patient is under my care and that I had a face-to-face encounter that meets the physician face-to-face encounter requirements with this patient on this date. The encounter with the patient was in whole or in part for the following MEDICAL CONDITION: (primary reason for [name]) MEDICAL NECESSITY: I certify, that based on my findings, NURSING services are a medically necessary home health service. HOME BOUND STATUS: I certify that my clinical findings support that this patient is homebound (i.e., Due to illness or injury, pt requires aid of supportive devices such as crutches, cane, wheelchairs, walkers, the use of special transportation or the assistance of another person to leave their place of residence. There is a normal inability to leave the home and doing so requires considerable and taxing effort. Other absences are for medical reasons / religious services and are infrequent or of short duration when for other reasons). o If current dressing causes regression in wound condition, may D/C ordered dressing product/s and apply Normal Saline Moist Dressing daily until next [place] / Other MD appointment. [place] of regression in wound condition at [phone]. o Please direct any NON-WOUND related issues/requests for orders to patient's Primary Care Physician Wound #7 Right [place] Visits - [facility] - Please provide dressing materials for patient o Home Health Nurse may visit PRN to address patientos wound care needs. o FACE TO FACE ENCOUNTER: MEDICARE and MEDICAID PATIENTS: I certify that this patient is under my care and that I had a face-to-face encounter that meets the physician face-to-face encounter requirements with this patient on this date.  The encounter with the patient was in whole or in part for the following MEDICAL CONDITION: (primary reason for [place]) MEDICAL NECESSITY: I certify, that based on my findings, NURSING services are a medically necessary home health service. HOME BOUND STATUS: I certify that my clinical findings support that this patient is homebound (i.e., Due to illness or injury, pt requires aid of supportive devices such as crutches, cane, wheelchairs, walkers, the use of special transportation or the assistance of another person to leave their place of residence. There is a normal inability to leave the home and doing so requires considerable and taxing effort. Other absences are for medical reasons / religious services and are infrequent or of short duration when for other reasons). o If current dressing causes regression in wound condition, may D/C ordered dressing product/s and apply Normal Saline Moist  Dressing daily until next [facility] Center / Other MD appointment. Notify Wound Healing Center of regression in wound condition at [phone]. o Please direct any NON-WOUND related issues/requests for orders to patient's Primary Care Physician Electronic Signature(s) Signed: 12/09/2019 2:05:26 PM By: [name] Signed: 12/09/2019 3:18:05 PM By: [name] Kelp PA-C Entered By: [name] on 12/09/2019 13:44:22 [name], [name] ([id number]) -------------------------------------------------------------------------------- Problem List Details Patient Name: LAUNA, GOEDKEN. Date of Service: 12/09/2019 1:15 PM Medical Record Number: [id number] Patient Account Number: [id number] Date of Birth/Sex: 1966/05/02 (55 y.o. F) Treating RN: [name] Primary Care Provider: [place], [name] Other Clinician: Referring Provider: [place], [name] Treating Provider/Extender: [name] Dibbles, Kelani Robart Weeks in Treatment: 35 Active Problems ICD-10 Encounter Code Description Active Date  MDM Diagnosis L89.313 Pressure ulcer of right buttock, stage 3 04/08/2019 No Yes L89.323 Pressure ulcer of left buttock, stage 3 04/08/2019 No Yes L89.213 Pressure ulcer of right hip, stage 3 04/08/2019 No Yes G82.52 Quadriplegia, C1-C4 incomplete 04/08/2019 No Yes E44.0 Moderate protein-calorie malnutrition 04/08/2019 No Yes Inactive Problems Resolved Problems Electronic Signature(s) Signed: 12/09/2019 1:40:55 PM By: [name] Kelp PA-C Entered By: [name] on 12/09/2019 13:40:55 [name], [name] ([id number]) -------------------------------------------------------------------------------- Progress Note Details Patient Name: [name] Date of Service: 12/09/2019 1:15 PM Medical Record Number: [id number] Patient Account Number: [id number] Date of Birth/Sex: 1965/08/27 (54 y.o. F) Treating RN: [name] Primary Care Provider: [place], [name] Other Clinician: Referring Provider: [place], [name] Treating Provider/Extender: [name] Dibbles, Rondi Ivy Weeks in Treatment: 35 Subjective Chief Complaint Information obtained from Patient Bilateral gluteal and right hip pressure ulcers History of Present Illness (HPI) 54 year old patient who was been an unreliable historian says she had completely healed in February and most recently her home health noticed that the wound on the right scapular area had reopened. She was in hospital about a month ago with pneumonia at that time a workup was done. We have no notes today but will workup the electronic medical record. on review of her records electronically I do not find any recent hospital MRI in the last 6 months. She had a CT scan of the chest in February 2018 which does not show any osseous lesions. she was admitted to the hospital between May 14 and May 17 for 3 days for mucus plugging and collapse of the left lung. at that time she had a decubitus ulcer of the right scapular region stage III. her history was noted to have quadriplegia C5-C7  incomplete, neurogenic bladder, nephrostomy catheter and emphysema. patient tells me that she has not smoked for last month and a half but as per her hospital records she was smoking in the middle of May. 01/12/17 patient scapular region appears to be doing fairly well on evaluation today. She is having really no syndicate discomfort and has been tolerating the dressing changes well complication. Readmission: 04/08/2019 patient presents today for reevaluation in our clinic concerning issues she is having with wounds on the bilateral gluteal area as well as on the right trochanter. She has unfortunately noted that things seem to have gotten worse as she was using the Clinitron bed. The patient states it gives off a lot of heat and subsequently has not been good for her skin causing her to breakdown more. She has been trying to get back just to the air mattress but has not been able to get the company to come pick up that bed and bring out the air mattress. Her primary care provider is on this as well  trying to get them to do so. Unfortunately her wounds are significantly large and show signs of hyper granulation as well. She has had this kind of issue for quite some time and unfortunately just does not seem to be showing signs of significant improvement with what she has been using currently. No fevers, chills, nausea, vomiting, or diarrhea. She does attempt appropriate offloading and again has been using the bed because that is all she has although she is not happy with it. 04/22/2019 on evaluation today patient appears to be doing about the same with regard to her wounds at this time. Fortunately there does not appear to be any signs of active infection at this point which is good news. She has been tolerating the dressing changes without complication. Overall I am pleased with that with that being said unfortunately she does have a little bit of more significant breakdown with regard to the  wound bed upon evaluation today. I believe this is secondary to the fact that she has been lying potentially too much in a single position for too long over the past several weeks. I think that potentially if she were to mitigate this to a degree that things may improve somewhat. With that being said she is really not happy with her current bed she is attempted to have them come out and switch this back to an air mattress but unfortunately has not been successful in getting them to come out and make this change. She is very frustrated with that. She still has the Clinitron bed and she much prefers just having the standard air mattress. Patient's primary care provider still working on this she tells me. 05/08/2019 on evaluation today patient unfortunately is not doing as well as I would like to see in regard to her wounds. She actually is measuring a little bit larger pretty much at all wound locations unfortunately and overall I am very upset as well with the fact that she has been trying to get the Clinitron bed removed from her home but is not having any luck as such. We did get the number for the company today in order for [place] to see about calling them and switching this over to an air mattress as this is giving her a lot of trouble she states is really hot and she feels like it is breaking down her skin. This is not good. 05/22/2019 on evaluation today patient appears to be doing well with regard to the wounds she is showing some signs of slight improvement at this point. This is good news. Fortunately there is no evidence of active infection at this time which is also good news. No fevers, chills, nausea, vomiting, or diarrhea. She does tell me that in order to get rid of the Clinitron bed that she has been working on that the representative from the company stated they needed in order for me asked to be getting rid of it and then what we wanted her to have. Again I will be more than happy to do  that we can initiate an air mattress for her. 06/06/2019 on evaluation today patient continues to have issues with her wounds. The good news is we figured out what to be done for the her bad as far as get not moved however she is getting actually ready to move and change residences. For that reason what they are going to do is come in and take the bed from the current residence to get rid of the  Clinitron bed and then subsequently move the new bed into her new place of residence. This makes complete sense to me as well and I think that sounds like a good plan. At least we have a plan for what to do as far as getting things taken care of here. 06/24/2019 on evaluation today patient appears to be doing really about the same in some regards although a couple of the wounds are measuring a little bit larger and deeper compared to previous. With that being said I do think that unfortunately her wounds seem to be overall progressing in the wrong direction towards being worse not better. The patient is still having to use the Clinitron bed at this time I do think that she really needs to switch to just a standard air mattress that is what she wants but she is having to wait until she moves in 2 weeks before she can get the new mattress so she does not have to pay to have the bed moved twice. 07/18/2019 on evaluation today patient appears to actually be doing somewhat better in regard to her wounds in general. I am very pleased in this regard. With that being said she still has some hyper granulation unfortunately the Hydrofera Blue just was getting stuck to her and the silver cell has done better. Nonetheless I do believe that we do need to probably treat this with some silver nitrate to try to help with the hypergranulation I discussed this with the patient today. She is in agreement with this plan. [name], [name] ([id number]) 07/29/2019 on evaluation today patient seems to be making some progress albeit  slow regard to her wounds. Fortunately there is no signs of active infection at this time. No fever chills noted. She overall seems to be doing quite well at this point. 08/12/2019 upon evaluation today patient appears to be doing about the same at this point in regard to her wounds. She still has some hyper granulation. I really feel like she would do better with Hydrofera Blue over the alginate itself. With that being said we have not been able to use that for a while as it was sticking but again it has been sometime she was having issues with a different bed at that time as well which may have contributed to some of the issues at that point. Fortunately there is no evidence of active infection at this time. No fevers, chills, nausea, vomiting, or diarrhea. 09/04/2019 upon evaluation today patient actually appears to be doing excellent in regard to her wounds across the board. I am extremely pleased with how things appear today. She has a lot of new epithelization, good granulation, and overall she states that she is doing very well with the current bed. Everything about today seems to be on the up and up. 09/25/2019 upon evaluation today patient appears to be doing excellent in regard to her wounds. She has been tolerating the dressing changes without complication. Fortunately there is no evidence of active infection at this time. No fevers, chills, nausea, vomiting, or diarrhea. Overall she is actually been making great progress since getting off of the air-fluidized mattress I am happy in that regard. 10/17/2019 upon evaluation today patient appears to be doing well at this point in regard to her wounds. She is making good progress at this time which is good news. Fortunately there is no signs of active infection at this time. No fevers, chills, nausea, vomiting, or diarrhea. 10/31/2019 upon evaluation today patient actually  appears to be doing excellent in regard to her wounds currently. I feel like  the [facility] is doing a great job. Overall very pleased with the progress she is making. 11/21/2019; patient has wounds on her lower sacrum and right greater trochanter posteriorly. The wounds are epithelializing. No debridement was required. She has a level 3 surface for offloading. She tells [place] that she spends mostly all day in bed. She is a cervical level quadriplegia. 12/09/2019 upon evaluation today patient appears to be doing excellent in regard to her wounds. She has been tolerating the dressing changes without complication. Fortunately there does not appear to be any signs of active infection which is great news and overall I am extremely pleased. I think her wounds are getting closer to complete resolution. 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 For about 4-5 months she's had a decubitus ulcer on the right scapular region. she was lost to follow-up for the last 6 months. she had a couple of small areas which have opened out in the region of previous scar tissue in the sacral region but these haveall healed now. She does not have any other significant comorbidities. She has a good Roho cushion for her wheelchair and she has a specialized mattress for sleep. She has restarted smoking and smokes about half a pack of cigarettes a day 02/18/2015 -- X-ray of the pelvis -- IMPRESSION:No acute osseous injury of the pelvis. If there is clinical concern regarding osteomyelitis, recommend further evaluation with CT. X-ray of the sacrum and coccyx IMPRESSION:No acute osseous abnormality of the sacrum. If there is further clinical concern regarding osteomyelitis of the sacrum, further evaluation with CT is recommended. ========= Objective Constitutional Well-nourished and well-hydrated in no acute distress. Vitals Time Taken: 1:20 PM, Height: 63 in, Weight: 70 lbs,  BMI: 12.4, Temperature: 98.2 F, Pulse: 71 bpm, Respiratory Rate: 16 breaths/min, Blood Pressure: 121/65 mmHg. Respiratory normal breathing without difficulty. Psychiatric this patient is able to make decisions and demonstrates good insight into disease process. Alert and Oriented x 3. pleasant and cooperative. General Notes: His wound bed currently showed signs of good epithelization and granulation there was no significant slough buildup in any location no sharp debridement necessary and overall I feel like she is doing quite well. Integumentary (Hair, Skin) Wound #5 status is Open. Original cause of wound was Gradually Appeared. The wound is located on the Right Sacrum. The wound measures 4cm length x 6cm width x 0.1cm depth; 18.85cm^2 area and 1.885cm^3 volume. There is Fat Layer (Subcutaneous Tissue) Exposed exposed. There is a medium amount of serous drainage noted. The wound margin is flat and intact. There is large (67-100%) pink, hyper - granulation within the wound bed. There is a small (1-33%) amount of necrotic tissue within the wound bed including Adherent Slough. Wound #7 status is Open. Original cause of wound was Gradually Appeared. The wound is located on the Right Trochanter. The wound measures [place], Emmilia M. ([id number]) 1.5cm length x 3cm width x 0.1cm depth; 3.534cm^2 area and 0.353cm^3 volume. There is Fat Layer (Subcutaneous Tissue) Exposed exposed. There is no tunneling or undermining noted. There is a medium amount of serous drainage noted. The wound margin is flat and intact. There is large (67-100%) red, hyper - granulation within the wound bed. There is a small (1-33%) amount of necrotic tissue within the wound bed including Adherent Slough. 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 May Shower, gently pat wound dry prior to applying new dressing. Wound #7 Right Trochanter: Clean wound with Normal Saline. May Shower, gently pat wound dry prior to applying new dressing. Primary Wound Dressing: Wound #5 Right Sacrum: Hydrafera Blue Ready Transfer - Please order Hydrofera Blue Ready Transfer specifically for patient r/t wound location and wet with saline or warm soapy water prior to removal Wound #7 Right Trochanter: Hydrafera Blue Ready Transfer - Please order Hydrofera Blue Ready Transfer specifically for patient r/t wound location and wet with saline or warm soapy water prior to removal Secondary Dressing: Wound #5 Right Sacrum: ABD pad - Secure with tape or bordered foam dressing Wound #7 Right Trochanter: ABD pad - Secure with tape or bordered foam dressing Dressing Change Frequency: Wound #5 Right Sacrum: Change Dressing Monday, Wednesday, Friday - or three times weekly Wound #7 Right Trochanter: Change Dressing Monday, Wednesday, Friday - or three times weekly Follow-up Appointments: Wound #5 Right Sacrum: Return Appointment in 2 weeks. Wound #7 Right Trochanter: Return Appointment in 2 weeks. Off-Loading: Wound #5 Right Sacrum: Mattress - air mattress or fluidized air mattress Turn and reposition every 2 hours Wound #7 Right Trochanter: Mattress - air mattress or fluidized air mattress Turn and reposition every 2 hours Additional Orders / Instructions: Wound #5 Right Sacrum: Increase protein intake. Increase protein intake. Wound #7 Right Trochanter: Increase protein intake. Increase protein intake. Home Health: Wound #5 Right Sacrum: Continue Home Health Visits - [facility] - Please provide dressing materials for patient Home Health Nurse may visit PRN to address patient s wound care needs. FACE TO FACE ENCOUNTER: MEDICARE and MEDICAID PATIENTS: I certify  that this patient is under my care and that I had a face-to-face encounter that meets the physician face-to-face encounter requirements with this patient on this date. The encounter with the patient was in whole or in part for the following MEDICAL CONDITION: (primary reason for Home Healthcare) MEDICAL NECESSITY: I certify, that based on my findings, NURSING services are a medically necessary home health service. HOME BOUND STATUS: I certify that my clinical findings support that [name], [name] ([id number]) this patient is homebound (i.e., Due to illness or injury, pt requires aid of supportive devices such as crutches, cane, wheelchairs, walkers, the use of special transportation or the assistance of another person to leave their place of residence. There is a normal inability to leave the home and doing so requires considerable and taxing effort. Other absences are for medical reasons / religious services and are infrequent or of short duration when for other reasons). If current dressing causes regression in wound condition, may D/C ordered dressing product/s and apply Normal Saline Moist Dressing daily until next Wound Healing Center / Other MD appointment. Notify Wound Healing Center of regression in wound condition at [phone]. Please direct any NON-WOUND related issues/requests for orders to patient's Primary Care Physician Wound #7 Right Trochanter: Continue Home Health Visits - [facility] - Please provide dressing materials for patient Home Health Nurse may visit PRN to address patient s wound care needs. FACE TO FACE ENCOUNTER: MEDICARE and MEDICAID PATIENTS: I certify that this patient is under my care and that I had a face-to-face encounter that meets the physician face-to-face encounter requirements with this patient on  this date. The encounter with the patient was in whole or in part for the following MEDICAL CONDITION: (primary reason for Home Healthcare) MEDICAL NECESSITY: I  certify, that based on my findings, NURSING services are a medically necessary home health service. HOME BOUND STATUS: I certify that my clinical findings support that this patient is homebound (i.e., Due to illness or injury, pt requires aid of supportive devices such as crutches, cane, wheelchairs, walkers, the use of special transportation or the assistance of another person to leave their place of residence. There is a normal inability to leave the home and doing so requires considerable and taxing effort. Other absences are for medical reasons / religious services and are infrequent or of short duration when for other reasons). If current dressing causes regression in wound condition, may D/C ordered dressing product/s and apply Normal Saline Moist Dressing daily until next Wound Healing Center / Other MD appointment. Notify Wound Healing Center of regression in wound condition at [phone]. Please direct any NON-WOUND related issues/requests for orders to patient's Primary Care Physician 1. I would recommend currently that we continue with the wound care measures as before. The patient seems to be doing quite well and I see no reason to make any changes at this time. Therefore we will continue with the [facility]. 2. I am also can recommend that we continue with the ABD pad secured with tape or border foam dressing depending on what works best for her. 3. I would also recommend appropriate offloading she is using the air mattress which is great for her and she is rotating often as well which also seems to be doing excellent. We will see patient back for reevaluation in 1 week here in the clinic. If anything worsens or changes patient will contact our office for additional recommendations. Electronic Signature(s) Signed: 12/09/2019 1:47:36 PM By: [name] Kelp PA-C Entered By: [name] on 12/09/2019 13:47:35 [place], [name]  ([id number]) -------------------------------------------------------------------------------- SuperBill Details Patient Name: CATALEYAH, COLBORN. Date of Service: 12/09/2019 Medical Record Number: [id number] Patient Account Number: [id number] Date of Birth/Sex: 08/03/1965 (54 y.o. F) Treating RN: [name] Primary Care Provider: [place], [name] Other Clinician: Referring Provider: [place], [name] Treating Provider/Extender: [name] Dibbles, Lotus Santillo Weeks in Treatment: 35 Diagnosis Coding ICD-10 Codes Code Description L89.313 Pressure ulcer of right buttock, stage 3 L89.323 Pressure ulcer of left buttock, stage 3 L89.213 Pressure ulcer of right hip, stage 3 G82.52 Quadriplegia, C1-C4 incomplete E44.0 Moderate protein-calorie malnutrition Facility Procedures CPT4 Code: 86578469 Description: 99213 - WOUND CARE VISIT-LEV 3 EST PT Modifier: Quantity: 1 Physician Procedures CPT4 Code: [id number] Description: 99213 - WC PHYS LEVEL 3 - EST PT Modifier: Quantity: 1 CPT4 Code: Description: ICD-10 Diagnosis Description L89.313 Pressure ulcer of right buttock, stage 3 L89.323 Pressure ulcer of left buttock, stage 3 L89.213 Pressure ulcer of right hip, stage 3 G82.52 Quadriplegia, C1-C4 incomplete Modifier: Quantity: Electronic Signature(s) Signed: 12/09/2019 1:47:55 PM By: [name] Kelp PA-C Entered By: [name] on 12/09/2019 13:47:53

## 2019-12-10 NOTE — Progress Notes (Signed)
ARMONEE, BOJANOWSKI ([id number]) Visit Report for 12/09/2019 Arrival Information Details Patient Name: [name], [name] Date of Service: 12/09/2019 1:15 PM Medical Record Number: [id number] Patient Account Number: [id number] Date of Birth/Sex: 19-Mar-1966 (54 y.o. F) Treating RN: Curtis Sites Primary Care Mariko Nowakowski: [place], JULIE Other Clinician: Referring Murlene Revell: [place], [place]ULIE Treating Wonder Donaway/Extender: Linwood Dibbles, HOYT Weeks in Treatment: 35 Visit Information History Since Last Visit Added or deleted any medications: No Patient Arrived: Wheel Chair Any new allergies or adverse reactions: No Arrival Time: 13:21 Had a fall or experienced change in No Accompanied By: husband activities of daily living that may affect Transfer Assistance: Manual risk of falls: Patient Identification Verified: Yes Signs or symptoms of abuse/neglect since last visito No Secondary Verification Process Completed: Yes Hospitalized since last visit: No Patient Requires Transmission-Based Precautions: No Implantable device outside of the clinic excluding No Patient Has Alerts: No cellular tissue based products placed in the center since last visit: Has Dressing in Place as Prescribed: Yes Pain Present Now: No Electronic Signature(s) Signed: 12/09/2019 4:23:34 PM By: Dayton Martes RCP, RRT, CHT Entered By: Weyman Rodney, Lucio Edward on 12/09/2019 13:22:26 Brinegar, [name] [name] ([id number]) -------------------------------------------------------------------------------- Clinic Level of Care Assessment Details Patient Name: [name], [name] Date of Service: 12/09/2019 1:15 PM Medical Record Number: [id number] Patient Account Number: [id number] Date of Birth/Sex: 08/03/1965 (54 y.o. F) Treating RN: Curtis Sites Primary Care Garrie Elenes: [place], JULIE Other Clinician: Referring Tee Richeson: [place], [place]ULIE Treating Takako Minckler/Extender: Linwood Dibbles, HOYT Weeks in Treatment: 35 Clinic Level of Care  Assessment Items TOOL 4 Quantity Score []  - Use when only an EandM is performed on FOLLOW-UP visit 0 ASSESSMENTS - Nursing Assessment / Reassessment X - Reassessment of Co-morbidities (includes updates in patient status) 1 10 X- 1 5 Reassessment of Adherence to Treatment Plan ASSESSMENTS - Wound and Skin Assessment / Reassessment []  - Simple Wound Assessment / Reassessment - one wound 0 X- 2 5 Complex Wound Assessment / Reassessment - multiple wounds []  - 0 Dermatologic / Skin Assessment (not related to wound area) ASSESSMENTS - Focused Assessment []  - Circumferential Edema Measurements - multi extremities 0 []  - 0 Nutritional Assessment / Counseling / Intervention []  - 0 Lower Extremity Assessment (monofilament, tuning fork, pulses) []  - 0 Peripheral Arterial Disease Assessment (using hand held doppler) ASSESSMENTS - Ostomy and/or Continence Assessment and Care []  - Incontinence Assessment and Management 0 []  - 0 Ostomy Care Assessment and Management (repouching, etc.) PROCESS - Coordination of Care X - Simple Patient / Family Education for ongoing care 1 15 []  - 0 Complex (extensive) Patient / Family Education for ongoing care X- 1 10 Staff obtains , Records, Test Results / Process Orders []  - 0 Staff telephones HHA, Nursing Homes / Clarify orders / etc []  - 0 Routine Transfer to another Facility (non-emergent condition) []  - 0 Routine Hospital Admission (non-emergent condition) []  - 0 New Admissions / / Ordering NPWT, Apligraf, etc. []  - 0 Emergency Hospital Admission (emergent condition) X- 1 10 Simple Discharge Coordination []  - 0 Complex (extensive) Discharge Coordination PROCESS - Special Needs []  - Pediatric / Minor Patient Management 0 []  - 0 Isolation Patient Management []  - 0 Hearing / Language / Visual special needs []  - 0 Assessment of Community assistance (transportation, D/C planning, etc.) []  - 0 Additional  assistance / Altered mentation []  - 0 Support Surface(s) Assessment (bed, cushion, seat, etc.) INTERVENTIONS - Wound Cleansing / Measurement Heizer, Jonnie M. ( ) []  - 0 Simple Wound Cleansing - one wound X-  2 5 Complex Wound Cleansing - multiple wounds X- 1 5 Wound Imaging (photographs - any number of wounds) []  - 0 Wound Tracing (instead of photographs) []  - 0 Simple Wound Measurement - one wound X- 2 5 Complex Wound Measurement - multiple wounds INTERVENTIONS - Wound Dressings X - Small Wound Dressing one or multiple wounds 2 10 []  - 0 Medium Wound Dressing one or multiple wounds []  - 0 Large Wound Dressing one or multiple wounds []  - 0 Application of Medications - topical []  - 0 Application of Medications - injection INTERVENTIONS - Miscellaneous []  - External ear exam 0 []  - 0 Specimen Collection (cultures, biopsies, blood, body fluids, etc.) []  - 0 Specimen(s) / Culture(s) sent or taken to Lab for analysis []  - 0 Patient Transfer (multiple staff / / Similar devices) []  - 0 Simple Staple / Suture removal (25 or less) []  - 0 Complex Staple / Suture removal (26 or more) []  - 0 Hypo / Hyperglycemic Management (close monitor of Blood Glucose) []  - 0 Ankle / Brachial Index (ABI) - do not check if billed separately X- 1 5 Vital Signs Has the patient been seen at the hospital within the last three years: Yes Total Score: 110 Level Of Care: New/Established - Level 3 Electronic Signature(s) Signed: 12/09/2019 2:05:26 PM By: Entered By: on 12/09/2019 13:45:07 Smullen, ( ) -------------------------------------------------------------------------------- Encounter Discharge Information Details Patient Name: [name], [name] Date of Service: 12/09/2019 1:15 PM Medical Record Number: Patient Account Number: Nurse, adult Date of Birth/Sex: 1966/06/28 (54 y.o. F) Treating RN: Primary Care Jory Welke: , JULIE Other Clinician: Referring Wilba Mutz: 02/08/2020, JULIE Treating Quindarius Cabello/Extender: Curtis Sites, HOYT Weeks in Treatment: 35 Encounter Discharge Information Items Discharge Condition: Stable Ambulatory Status: Wheelchair Discharge Destination: Home Transportation: Private Auto Accompanied By: caregiver Schedule Follow-up Appointment: Yes Clinical Summary of Care: Electronic Signature(s) Signed: 12/09/2019 2:05:26 PM By: 02/08/2020 Entered By: [name] [name] on 12/09/2019 13:46:44 Notz, [name] Masters (02/08/2020) -------------------------------------------------------------------------------- Lower Extremity Assessment Details Patient Name: [name], [name] Date of Service: 12/09/2019 1:15 PM Medical Record Number: 09/21/1965 Patient Account Number: [id number] Date of Birth/Sex: 10-04-1965 (54 y.o. F) Treating RN: [place] Primary Care Anshu Wehner: Linwood Dibbles, JULIE Other Clinician: Referring Lavaeh Bau: 02/08/2020, JULIE Treating Wai Minotti/Extender: Curtis Sites in Treatment: 35 Electronic Signature(s) Signed: 12/10/2019 7:21:37 AM By: 02/08/2020, BSN, RN, CWS, Kim RN, BSN Entered By: [name] [name], BSN, RN, CWS, Kim on 12/09/2019 13:34:52 Nicolaisen, [name] Masters (02/08/2020) -------------------------------------------------------------------------------- Multi Wound Chart Details Patient Name: [name], [name] Date of Service: 12/09/2019 1:15 PM Medical Record Number: 09/21/1965 Patient Account Number: [id number] Date of Birth/Sex: April 14, 1966 (54 y.o. F) Treating RN: [place] Primary Care Jandi Swiger: Skeet Simmer, JULIE Other Clinician: Referring Zoraya Fiorenza: 02/09/2020, JULIE Treating Edwin [name]/Extender: STONE III, HOYT Weeks in Treatment: 35 Vital Signs Height(in): 63 Pulse(bpm): 71 Weight(lbs): 70 Blood Pressure(mmHg): 121/65 Body Mass Index(BMI): 12 Temperature(F): 98.2 Respiratory Rate(breaths/min): 16 Photos: [N/A:N/A] Wound Location: Right Sacrum Right  Trochanter N/A Wounding Event: Gradually Appeared Gradually Appeared N/A Primary Etiology: Pressure Ulcer Pressure Ulcer N/A Comorbid History: Asthma, Chronic Obstructive Asthma, Chronic Obstructive N/A Pulmonary Disease (COPD), History Pulmonary Disease (COPD), History of pressure wounds, Paraplegia of pressure wounds, Paraplegia Date Acquired: 10/16/2018 04/02/2018 N/A Weeks of Treatment: 35 35 N/A Wound Status: Open Open N/A Clustered Wound: Yes No N/A Clustered Quantity: 5 N/A N/A Measurements L x W x D (cm) 4x6x0.1 1.5x3x0.1 N/A Area (cm) : 18.85 3.534 N/A Volume (cm) : 1.885 0.353 N/A % Reduction  in Area: 60.00% 77.50% N/A % Reduction in Volume: 94.30% 97.50% N/A Classification: Category/Stage III Category/Stage III N/A Exudate Amount: Medium Medium N/A Exudate Type: Serous Serous N/A Exudate Color: amber amber N/A Wound Margin: Flat and Intact Flat and Intact N/A Granulation Amount: Large (67-100%) Large (67-100%) N/A Granulation Quality: Pink, Hyper-granulation Red, Hyper-granulation N/A Necrotic Amount: Small (1-33%) Small (1-33%) N/A Exposed Structures: Fat Layer (Subcutaneous Tissue) Fat Layer (Subcutaneous Tissue) N/A Exposed: Yes Exposed: Yes Fascia: No Fascia: No Tendon: No Tendon: No Muscle: No Muscle: No Joint: No Joint: No Bone: No Bone: No Epithelialization: Large (67-100%) Small (1-33%) N/A Treatment Notes Electronic Signature(s) Signed: 12/09/2019 2:05:26 PM By: Curtis Sitesorthy, Joanna Entered By: Curtis Sitesorthy, Joanna on 12/09/2019 13:43:15 [name], [name] ([id number]) [name], [name] ([id number]) -------------------------------------------------------------------------------- Multi-Disciplinary Care Plan Details Patient Name: [name], [name] Date of Service: 12/09/2019 1:15 PM Medical Record Number: [id number] Patient Account Number: [id number] Date of Birth/Sex: 1965/07/13 (54 y.o. F) Treating RN: Curtis Sitesorthy, Joanna Primary Care Gregg Holster: [place], JULIE Other  Clinician: Referring Gerlad Pelzel: [place], JULIE Treating Aalyssa Elderkin/Extender: STONE III, HOYT Weeks in Treatment: 35 Active Inactive Abuse / Safety / Falls / Self Care Management Nursing Diagnoses: Impaired physical mobility Goals: Patient will not develop complications from immobility Date Initiated: 04/08/2019 Target Resolution Date: 07/12/2019 Goal Status: Active Interventions: Assess fall risk on admission and as needed Notes: Nutrition Nursing Diagnoses: Potential for alteratiion in Nutrition/Potential for imbalanced nutrition Goals: Patient/caregiver agrees to and verbalizes understanding of need to use nutritional supplements and/or vitamins as prescribed Date Initiated: 04/08/2019 Target Resolution Date: 07/12/2019 Goal Status: Active Interventions: Assess patient nutrition upon admission and as needed per policy Notes: Pressure Nursing Diagnoses: Knowledge deficit related to management of pressures ulcers Goals: Patient will remain free of pressure ulcers Date Initiated: 04/08/2019 Target Resolution Date: 07/12/2019 Goal Status: Active Interventions: Assess potential for pressure ulcer upon admission and as needed Notes: Wound/Skin Impairment Nursing Diagnoses: Impaired tissue integrity Goals: Ulcer/skin breakdown will heal within 14 weeks Date Initiated: 04/08/2019 Target Resolution Date: 07/12/2019 Goal Status: Active [name], Levenia M. ([id number]) Interventions: Assess patient/caregiver ability to obtain necessary supplies Assess patient/caregiver ability to perform ulcer/skin care regimen upon admission and as needed Assess ulceration(s) every visit Notes: Electronic Signature(s) Signed: 12/09/2019 2:05:26 PM By: Curtis Sitesorthy, Joanna Entered By: Curtis Sitesorthy, Joanna on 12/09/2019 13:43:05 [name], [name] ([id number]) -------------------------------------------------------------------------------- Pain Assessment Details Patient Name: [name], [name] Date of Service:  12/09/2019 1:15 PM Medical Record Number: [id number] Patient Account Number: [id number] Date of Birth/Sex: 1965/07/13 (54 y.o. F) Treating RN: Huel CoventryWoody, Kim Primary Care [name] Adami: [place], JULIE Other Clinician: Referring Brittney Caraway: [place], JULIE Treating Layth Cerezo/Extender: Linwood DibblesSTONE III, HOYT Weeks in Treatment: 35 Active Problems Location of Pain Severity and Description of Pain Patient Has Paino No Site Locations Pain Management and Medication Current Pain Management: Electronic Signature(s) Signed: 12/10/2019 7:21:37 AM By: Elliot GurneyWoody, BSN, RN, CWS, Kim RN, BSN Entered By: Elliot GurneyWoody, BSN, RN, CWS, Kim on 12/09/2019 13:25:15 [name], Rosellen M. ([id number]) -------------------------------------------------------------------------------- Patient/Caregiver Education Details Patient Name: [name], Sandralee M. Date of Service: 12/09/2019 1:15 PM Medical Record Number: [id number] Patient Account Number: [id number] Date of Birth/Gender: 1965/07/13 (54 y.o. F) Treating RN: Curtis Sitesorthy, Joanna Primary Care Physician: [place], JULIE Other Clinician: Referring Physician: [place], JULIE Treating Physician/Extender: Skeet SimmerSTONE III, HOYT Weeks in Treatment: 7835 Education Assessment Education Provided To: Patient and Caregiver Education Topics Provided Wound/Skin Impairment: Handouts: Other: wound care as ordered Methods: Explain/Verbal Responses: State content correctly Electronic Signature(s) Signed: 12/09/2019 2:05:26 PM By: Curtis Sitesorthy, Joanna Entered By: Curtis Sitesorthy, Joanna on 12/09/2019 13:45:37 Marchitto, [name] M. (  [id number]) -------------------------------------------------------------------------------- Wound Assessment Details Patient Name: MARLICIA, SROKA. Date of Service: 12/09/2019 1:15 PM Medical Record Number: [id number] Patient Account Number: [id number] Date of Birth/Sex: 1966/05/28 (53 y.o. F) Treating RN: Cornell Barman Primary Care Eryca Bolte: [place], JULIE Other Clinician: Referring Ellorie Kindall: [place], JULIE Treating  Tiah Heckel/Extender: STONE III, HOYT Weeks in Treatment: 35 Wound Status Wound Number: 5 Primary Pressure Ulcer Etiology: Wound Location: Right Sacrum Wound Open Wounding Event: Gradually Appeared Status: Date Acquired: 10/16/2018 Comorbid Asthma, Chronic Obstructive Pulmonary Disease (COPD), Weeks Of Treatment: 35 History: History of pressure wounds, Paraplegia Clustered Wound: Yes Photos Wound Measurements Length: (cm) 4 % R Width: (cm) 6 % R Depth: (cm) 0.1 Epi Clustered Quantity: 5 Area: (cm) 18.85 Volume: (cm) 1.885 eduction in Area: 60% eduction in Volume: 94.3% thelialization: Large (67-100%) Wound Description Classification: Category/Stage III Fou Wound Margin: Flat and Intact Slo Exudate Amount: Medium Exudate Type: Serous Exudate Color: amber l Odor After Cleansing: No ugh/Fibrino Yes Wound Bed Granulation Amount: Large (67-100%) Exposed Structure Granulation Quality: Pink, Hyper-granulation Fascia Exposed: No Necrotic Amount: Small (1-33%) Fat Layer (Subcutaneous Tissue) Exposed: Yes Necrotic Quality: Adherent Slough Tendon Exposed: No Muscle Exposed: No Joint Exposed: No Bone Exposed: No Treatment Notes Wound #5 (Right Sacrum) Notes hydrofera blue, abd and tape SAMHITA, KRETSCH ([id number]) Electronic Signature(s) Signed: 12/10/2019 7:21:37 AM By: Gretta Cool, BSN, RN, CWS, Kim RN, BSN Entered By: Gretta Cool, BSN, RN, CWS, Kim on 12/09/2019 13:34:13 Ebbs, [name] ([id number]) -------------------------------------------------------------------------------- Wound Assessment Details Patient Name: DENECE, SHEARER. Date of Service: 12/09/2019 1:15 PM Medical Record Number: [id number] Patient Account Number: [id number] Date of Birth/Sex: 10-27-1965 (54 y.o. F) Treating RN: Cornell Barman Primary Care Bocephus Cali: [place], JULIE Other Clinician: Referring Cheyenna Pankowski: [place], JULIE Treating Maydelin Deming/Extender: STONE III, HOYT Weeks in Treatment: 35 Wound Status Wound  Number: 7 Primary Pressure Ulcer Etiology: Wound Location: Right Trochanter Wound Open Wounding Event: Gradually Appeared Status: Date Acquired: 04/02/2018 Comorbid Asthma, Chronic Obstructive Pulmonary Disease (COPD), Weeks Of Treatment: 35 History: History of pressure wounds, Paraplegia Clustered Wound: No Photos Wound Measurements Length: (cm) 1.5 Width: (cm) 3 Depth: (cm) 0.1 Area: (cm) 3.534 Volume: (cm) 0.353 % Reduction in Area: 77.5% % Reduction in Volume: 97.5% Epithelialization: Small (1-33%) Tunneling: No Undermining: No Wound Description Classification: Category/Stage III Wound Margin: Flat and Intact Exudate Amount: Medium Exudate Type: Serous Exudate Color: amber Foul Odor After Cleansing: No Slough/Fibrino No Wound Bed Granulation Amount: Large (67-100%) Exposed Structure Granulation Quality: Red, Hyper-granulation Fascia Exposed: No Necrotic Amount: Small (1-33%) Fat Layer (Subcutaneous Tissue) Exposed: Yes Necrotic Quality: Adherent Slough Tendon Exposed: No Muscle Exposed: No Joint Exposed: No Bone Exposed: No Treatment Notes Wound #7 (Right Trochanter) Notes hydrofera blue, abd and tape Electronic Signature(s) Signed: 12/10/2019 7:21:37 AM By: Gretta Cool, BSN, RN, CWS, Kim RN, BSN Anne, [place]. ([id number]) Entered By: Gretta Cool, BSN, RN, CWS, Kim on 12/09/2019 13:34:40 Coley, [name] ([id number]) -------------------------------------------------------------------------------- Vitals Details Patient Name: Graylon Good. Date of Service: 12/09/2019 1:15 PM Medical Record Number: [id number] Patient Account Number: [id number] Date of Birth/Sex: 04-Mar-1966 (54 y.o. F) Treating RN: Montey Hora Primary Care Kadee Philyaw: [place], JULIE Other Clinician: Referring Chelise Hanger: [place], [place]ULIE Treating Gina Leblond/Extender: STONE III, HOYT Weeks in Treatment: 35 Vital Signs Time Taken: 13:20 Temperature (F): 98.2 Height (in): 63 Pulse (bpm): 71 Weight  (lbs): 70 Respiratory Rate (breaths/min): 16 Body Mass Index (BMI): 12.4 Blood Pressure (mmHg): 121/65 Reference Range: 80 - 120 mg / dl Electronic Signature(s) Signed: 12/09/2019 4:23:34 PM By: Becky Sax, Sallie RCP, RRT, CHT Entered By:  Weyman Rodney, Sallie on 12/09/2019 13:22:56

## 2019-12-23 ENCOUNTER — Ambulatory Visit: Payer: Medicare Other | Admitting: Physician Assistant

## 2019-12-26 ENCOUNTER — Encounter: Payer: Medicare Other | Admitting: Physician Assistant

## 2019-12-26 DIAGNOSIS — L89313 Pressure ulcer of right buttock, stage 3: Secondary | ICD-10-CM | POA: Diagnosis not present

## 2019-12-26 NOTE — Progress Notes (Addendum)
[name] ([id number]) Visit Report for 12/26/2019 Chief Complaint Document Details Patient Name: [name], [name] Date of Service: 12/26/2019 2:15 PM Medical Record Number: [id number] Patient Account Number: [id number] Date of Birth/Sex: 11/25/1965 (54 y.o. F) Treating RN: Curtis Sites Primary Care Provider: [place], JULIE Other Clinician: Referring Provider: [place], JULIE Treating Provider/Extender: STONE III, Nataliyah Packham Weeks in Treatment: 37 Information Obtained from: Patient Chief Complaint Bilateral gluteal and right hip pressure ulcers Electronic Signature(s) Signed: 12/26/2019 2:34:25 PM By: Lenda Kelp PA-C Entered By: Lenda Kelp on 12/26/2019 14:34:23 [name] ([id number]) -------------------------------------------------------------------------------- HPI Details Patient Name: [name] Date of Service: 12/26/2019 2:15 PM Medical Record Number: [id number] Patient Account Number: [id number] Date of Birth/Sex: 08-07-1965 (54 y.o. F) Treating RN: Curtis Sites Primary Care Provider: [place], JULIE Other Clinician: Referring Provider: [place], JULIE Treating Provider/Extender: Linwood Dibbles, [name] Weeks in Treatment: 37 History of Present Illness HPI Description: 54 year old patient who was been an unreliable historian says she had completely healed in February and most recently her home health noticed that the wound on the right scapular area had reopened. She was in hospital about a month ago with pneumonia at that time a workup was done. We have no notes today but will workup the electronic medical record. on review of her records electronically I do not find any recent hospital MRI in the last 6 months. She had a CT scan of the chest in February 2018 which does not show any osseous lesions. she was admitted to the hospital between May 14 and May 17 for 3 days for mucus plugging and collapse of the left lung. at that time she had a decubitus ulcer of  the right scapular region stage III. her history was noted to have quadriplegia C5-C7 incomplete, neurogenic bladder, nephrostomy catheter and emphysema. patient tells me that she has not smoked for last month and a half but as per her hospital records she was smoking in the middle of May. 01/12/17 patient scapular region appears to be doing fairly well on evaluation today. She is having really no syndicate discomfort and has been tolerating the dressing changes well complication. Readmission: 04/08/2019 patient presents today for reevaluation in our clinic concerning issues she is having with wounds on the bilateral gluteal area as well as on the right trochanter. She has unfortunately noted that things seem to have gotten worse as she was using the Clinitron bed. The patient states it gives off a lot of heat and subsequently has not been good for her skin causing her to breakdown more. She has been trying to get back just to the air mattress but has not been able to get the company to come pick up that bed and bring out the air mattress. Her primary care provider is on this as well trying to get them to do so. Unfortunately her wounds are significantly large and show signs of hyper granulation as well. She has had this kind of issue for quite some time and unfortunately just does not seem to be showing signs of significant improvement with what she has been using currently. No fevers, chills, nausea, vomiting, or diarrhea. She does attempt appropriate offloading and again has been using the bed because that is all she has although she is not happy with it. 04/22/2019 on evaluation today patient appears to be doing about the same with regard to her wounds at this time. Fortunately there does not appear to be any signs of active infection at this point which  is good news. She has been tolerating the dressing changes without complication. Overall I am pleased with that with that being said unfortunately  she does have a little bit of more significant breakdown with regard to the wound bed upon evaluation today. I believe this is secondary to the fact that she has been lying potentially too much in a single position for too long over the past several weeks. I think that potentially if she were to mitigate this to a degree that things may improve somewhat. With that being said she is really not happy with her current bed she is attempted to have them come out and switch this back to an air mattress but unfortunately has not been successful in getting them to come out and make this change. She is very frustrated with that. She still has the Clinitron bed and she much prefers just having the standard air mattress. Patient's primary care provider still working on this she tells me. 05/08/2019 on evaluation today patient unfortunately is not doing as well as I would like to see in regard to her wounds. She actually is measuring a little bit larger pretty much at all wound locations unfortunately and overall I am very upset as well with the fact that she has been trying to get the Clinitron bed removed from her home but is not having any luck as such. We did get the number for the company today in order for [place] to see about calling them and switching this over to an air mattress as this is giving her a lot of trouble she states is really hot and she feels like it is breaking down her skin. This is not good. 05/22/2019 on evaluation today patient appears to be doing well with regard to the wounds she is showing some signs of slight improvement at this point. This is good news. Fortunately there is no evidence of active infection at this time which is also good news. No fevers, chills, nausea, vomiting, or diarrhea. She does tell me that in order to get rid of the Clinitron bed that she has been working on that the representative from the company stated they needed in order for me asked to be getting rid of it  and then what we wanted her to have. Again I will be more than happy to do that we can initiate an air mattress for her. 06/06/2019 on evaluation today patient continues to have issues with her wounds. The good news is we figured out what to be done for the her bad as far as get not moved however she is getting actually ready to move and change residences. For that reason what they are going to do is come in and take the bed from the current residence to get rid of the Clinitron bed and then subsequently move the new bed into her new place of residence. This makes complete sense to me as well and I think that sounds like a good plan. At least we have a plan for what to do as far as getting things taken care of here. 06/24/2019 on evaluation today patient appears to be doing really about the same in some regards although a couple of the wounds are measuring a little bit larger and deeper compared to previous. With that being said I do think that unfortunately her wounds seem to be overall progressing in the wrong direction towards being worse not better. The patient is still having to use the Clinitron bed at  this time I do think that she really needs to switch to just a standard air mattress that is what she wants but she is having to wait until she moves in 2 weeks before she can get the new mattress so she does not have to pay to have the bed moved twice. 07/18/2019 on evaluation today patient appears to actually be doing somewhat better in regard to her wounds in general. I am very pleased in this regard. With that being said she still has some hyper granulation unfortunately the Hydrofera Blue just was getting stuck to her and the silver cell has done better. Nonetheless I do believe that we do need to probably treat this with some silver nitrate to try to help with the hypergranulation I discussed this with the patient today. She is in agreement with this plan. 07/29/2019 on evaluation today  patient seems to be making some progress albeit slow regard to her wounds. Fortunately there is no signs of active infection at this time. No fever chills noted. She overall seems to be doing quite well at this point. [name], [name] ([id number]) 08/12/2019 upon evaluation today patient appears to be doing about the same at this point in regard to her wounds. She still has some hyper granulation. I really feel like she would do better with Hydrofera Blue over the alginate itself. With that being said we have not been able to use that for a while as it was sticking but again it has been sometime she was having issues with a different bed at that time as well which may have contributed to some of the issues at that point. Fortunately there is no evidence of active infection at this time. No fevers, chills, nausea, vomiting, or diarrhea. 09/04/2019 upon evaluation today patient actually appears to be doing excellent in regard to her wounds across the board. I am extremely pleased with how things appear today. She has a lot of new epithelization, good granulation, and overall she states that she is doing very well with the current bed. Everything about today seems to be on the up and up. 09/25/2019 upon evaluation today patient appears to be doing excellent in regard to her wounds. She has been tolerating the dressing changes without complication. Fortunately there is no evidence of active infection at this time. No fevers, chills, nausea, vomiting, or diarrhea. Overall she is actually been making great progress since getting off of the air-fluidized mattress I am happy in that regard. 10/17/2019 upon evaluation today patient appears to be doing well at this point in regard to her wounds. She is making good progress at this time which is good news. Fortunately there is no signs of active infection at this time. No fevers, chills, nausea, vomiting, or diarrhea. 10/31/2019 upon evaluation today patient actually  appears to be doing excellent in regard to her wounds currently. I feel like the [facility] is doing a great job. Overall very pleased with the progress she is making. 11/21/2019; patient has wounds on her lower sacrum and right greater trochanter posteriorly. The wounds are epithelializing. No debridement was required. She has a level 3 surface for offloading. She tells [place] that she spends mostly all day in bed. She is a cervical level quadriplegia. 12/09/2019 upon evaluation today patient appears to be doing excellent in regard to her wounds. She has been tolerating the dressing changes without complication. Fortunately there does not appear to be any signs of active infection which is great news and overall  I am extremely pleased. I think her wounds are getting closer to complete resolution. 12/26/2019 upon evaluation today patient appears to be doing okay regard to her wounds currently. Fortunately there is no signs of active infection at this time. No fever chills noted. She has been taking care of of the wounds decently well but unfortunately is having some issues currently with staying up a little bit too much I think this has led to the wounds not being quite as good as what we were hoping for but nonetheless I think that she is still doing fairly well. 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 For about 4-5 months she's had a decubitus ulcer on the right scapular region. she was lost to follow-up for the last 6 months. she had a couple of small areas which have opened out in the region of previous scar tissue in the sacral region but these haveall healed now. She does not have any other significant comorbidities. She has a good Roho cushion for her wheelchair and she has a specialized mattress for sleep. She has restarted smoking and smokes about half a pack of  cigarettes a day 02/18/2015 -- X-ray of the pelvis -- IMPRESSION:No acute osseous injury of the pelvis. If there is clinical concern regarding osteomyelitis, recommend further evaluation with CT. X-ray of the sacrum and coccyx IMPRESSION:No acute osseous abnormality of the sacrum. If there is further clinical concern regarding osteomyelitis of the sacrum, further evaluation with CT is recommended. ========= Electronic Signature(s) Signed: 12/26/2019 2:39:33 PM By: Lenda Kelp PA-C Entered By: Lenda Kelp on 12/26/2019 14:39:33 Malkowski, [name] ([id number]) -------------------------------------------------------------------------------- Physical Exam Details Patient Name: ETHELREDA, SUKHU. Date of Service: 12/26/2019 2:15 PM Medical Record Number: [id number] Patient Account Number: [id number] Date of Birth/Sex: 1966/01/25 (54 y.o. F) Treating RN: Curtis Sites Primary Care Provider: [place], JULIE Other Clinician: Referring Provider: [place], JULIE Treating Provider/Extender: STONE III, Kwali Wrinkle Weeks in Treatment: 37 Constitutional Thin and well-hydrated in no acute distress. Respiratory normal breathing without difficulty. Psychiatric this patient is able to make decisions and demonstrates good insight into disease process. Alert and Oriented x 3. pleasant and cooperative. Notes Upon inspection today patient's wounds currently showed signs of good granulation at this time there does not appear to be any evidence of active infection which is great news. No fevers, chills, nausea, vomiting, or diarrhea. Electronic Signature(s) Signed: 12/26/2019 2:40:42 PM By: Lenda Kelp PA-C Entered By: Lenda Kelp on 12/26/2019 14:40:41 [name], [name] ([id number]) -------------------------------------------------------------------------------- Physician Orders Details Patient Name: [name], [name] Date of Service: 12/26/2019 2:15 PM Medical Record Number: [id number] Patient  Account Number: [id number] Date of Birth/Sex: 02-Mar-1966 (54 y.o. F) Treating RN: Curtis Sites Primary Care Provider: [place], JULIE Other Clinician: Referring Provider: [place], JULIE Treating Provider/Extender: Linwood Dibbles, Olivianna Higley Weeks in Treatment: 2 Verbal / Phone Orders: No Diagnosis Coding ICD-10 Coding Code Description L89.313 Pressure ulcer of right buttock, stage 3 L89.323 Pressure ulcer of left buttock, stage 3 L89.213 Pressure ulcer of right hip, stage 3 G82.52 Quadriplegia, C1-C4 incomplete E44.0 Moderate protein-calorie malnutrition Wound Cleansing Wound #5 Right Sacrum o Clean wound with Normal Saline. o May Shower, gently pat wound dry prior to applying new dressing. Wound #7 Right Trochanter o Clean wound with Normal Saline. o May Shower, gently pat wound dry prior to applying new dressing. Primary Wound Dressing Wound #5 Right Sacrum o Hydrafera  Blue Ready Transfer - Please order Hydrofera Blue Ready Transfer specifically for patient r/t wound location and wet with saline or warm soapy water prior to removal Wound #7 Right Trochanter o Hydrafera Blue Ready Transfer - Please order Hydrofera Blue Ready Transfer specifically for patient r/t wound location and wet with saline or warm soapy water prior to removal Secondary Dressing Wound #5 Right Sacrum o ABD pad - Secure with tape or bordered foam dressing Wound #7 Right Trochanter o ABD pad - Secure with tape or bordered foam dressing Dressing Change Frequency Wound #5 Right Sacrum o Change Dressing Monday, Wednesday, Friday - or three times weekly Wound #7 Right Trochanter o Change Dressing Monday, Wednesday, Friday - or three times weekly Follow-up Appointments Wound #5 Right Sacrum o Return Appointment in 2 weeks. Wound #7 Right Trochanter o Return Appointment in 2 weeks. Off-Loading Wound #5 Right Sacrum o Mattress - air mattress or fluidized air mattress o Turn and reposition  every 2 hours Wound #7 Right Trochanter Grivas, Omeka M. ([id number]) o Mattress - air mattress or fluidized air mattress o Turn and reposition every 2 hours Additional Orders / Instructions Wound #5 Right Sacrum o Increase protein intake. o Increase protein intake. Wound #7 Right Trochanter o Increase protein intake. o Increase protein intake. Home Health Wound #5 Right Sacrum o Continue Home Health Visits - [facility] - Please provide dressing materials for patient o Home Health Nurse may visit PRN to address patientos wound care needs. o FACE TO FACE ENCOUNTER: MEDICARE and MEDICAID PATIENTS: I certify that this patient is under my care and that I had a face-to-face encounter that meets the physician face-to-face encounter requirements with this patient on this date. The encounter with the patient was in whole or in part for the following MEDICAL CONDITION: (primary reason for Home Healthcare) MEDICAL NECESSITY: I certify, that based on my findings, NURSING services are a medically necessary home health service. HOME BOUND STATUS: I certify that my clinical findings support that this patient is homebound (i.e., Due to illness or injury, pt requires aid of supportive devices such as crutches, cane, wheelchairs, walkers, the use of special transportation or the assistance of another person to leave their place of residence. There is a normal inability to leave the home and doing so requires considerable and taxing effort. Other absences are for medical reasons / religious services and are infrequent or of short duration when for other reasons). o If current dressing causes regression in wound condition, may D/C ordered dressing product/s and apply Normal Saline Moist Dressing daily until next Wound Healing Center / Other MD appointment. Notify Wound Healing Center of regression in wound condition at [phone]. o Please direct any NON-WOUND related issues/requests  for orders to patient's Primary Care Physician Wound #7 Right Trochanter o Continue Home Health Visits - [facility] - Please provide dressing materials for patient o Home Health Nurse may visit PRN to address patientos wound care needs. o FACE TO FACE ENCOUNTER: MEDICARE and MEDICAID PATIENTS: I certify that this patient is under my care and that I had a face-to-face encounter that meets the physician face-to-face encounter requirements with this patient on this date. The encounter with the patient was in whole or in part for the following MEDICAL CONDITION: (primary reason for Home Healthcare) MEDICAL NECESSITY: I certify, that based on my findings, NURSING services are a medically necessary home health service. HOME BOUND STATUS: I certify that my clinical findings support that this patient is homebound (i.e., Due to  illness or injury, pt requires aid of supportive devices such as crutches, cane, wheelchairs, walkers, the use of special transportation or the assistance of another person to leave their place of residence. There is a normal inability to leave the home and doing so requires considerable and taxing effort. Other absences are for medical reasons / religious services and are infrequent or of short duration when for other reasons). o If current dressing causes regression in wound condition, may D/C ordered dressing product/s and apply Normal Saline Moist Dressing daily until next Wound Healing Center / Other MD appointment. Notify Wound Healing Center of regression in wound condition at [phone]. o Please direct any NON-WOUND related issues/requests for orders to patient's Primary Care Physician Electronic Signature(s) Signed: 12/26/2019 2:35:57 PM By: Curtis Sites Signed: 12/26/2019 3:42:29 PM By: Lenda Kelp PA-C Entered By: Curtis Sites on 12/26/2019 14:35:55 Bevan, [name]  ([id number]) -------------------------------------------------------------------------------- Problem List Details Patient Name: [name], [name] Date of Service: 12/26/2019 2:15 PM Medical Record Number: [id number] Patient Account Number: [id number] Date of Birth/Sex: October 04, 1965 (54 y.o. F) Treating RN: Curtis Sites Primary Care Provider: [place], JULIE Other Clinician: Referring Provider: [place], JULIE Treating Provider/Extender: Linwood Dibbles, Navy Rothschild Weeks in Treatment: 37 Active Problems ICD-10 Encounter Code Description Active Date MDM Diagnosis L89.313 Pressure ulcer of right buttock, stage 3 04/08/2019 No Yes L89.323 Pressure ulcer of left buttock, stage 3 04/08/2019 No Yes L89.213 Pressure ulcer of right hip, stage 3 04/08/2019 No Yes G82.52 Quadriplegia, C1-C4 incomplete 04/08/2019 No Yes E44.0 Moderate protein-calorie malnutrition 04/08/2019 No Yes Inactive Problems Resolved Problems Electronic Signature(s) Signed: 12/26/2019 2:23:03 PM By: Lenda Kelp PA-C Entered By: Lenda Kelp on 12/26/2019 14:23:02 [name], [name] ([id number]) -------------------------------------------------------------------------------- Progress Note Details Patient Name: [name] Date of Service: 12/26/2019 2:15 PM Medical Record Number: [id number] Patient Account Number: [id number] Date of Birth/Sex: 04-09-1966 (54 y.o. F) Treating RN: Curtis Sites Primary Care Provider: [place], JULIE Other Clinician: Referring Provider: [place], JULIE Treating Provider/Extender: Linwood Dibbles, Hind Chesler Weeks in Treatment: 37 Subjective Chief Complaint Information obtained from Patient Bilateral gluteal and right hip pressure ulcers History of Present Illness (HPI) 54 year old patient who was been an unreliable historian says she had completely healed in February and most recently her home health noticed that the wound on the right scapular area had reopened. She was in hospital about a month ago with  pneumonia at that time a workup was done. We have no notes today but will workup the electronic medical record. on review of her records electronically I do not find any recent hospital MRI in the last 6 months. She had a CT scan of the chest in February 2018 which does not show any osseous lesions. she was admitted to the hospital between May 14 and May 17 for 3 days for mucus plugging and collapse of the left lung. at that time she had a decubitus ulcer of the right scapular region stage III. her history was noted to have quadriplegia C5-C7 incomplete, neurogenic bladder, nephrostomy catheter and emphysema. patient tells me that she has not smoked for last month and a half but as per her hospital records she was smoking in the middle of May. 01/12/17 patient scapular region appears to be doing fairly well on evaluation today. She is having really no syndicate discomfort and has been tolerating the dressing changes well complication. Readmission: 04/08/2019 patient presents today for reevaluation in our clinic concerning issues she is having with wounds on the bilateral gluteal area as well as  on the right trochanter. She has unfortunately noted that things seem to have gotten worse as she was using the Clinitron bed. The patient states it gives off a lot of heat and subsequently has not been good for her skin causing her to breakdown more. She has been trying to get back just to the air mattress but has not been able to get the company to come pick up that bed and bring out the air mattress. Her primary care provider is on this as well trying to get them to do so. Unfortunately her wounds are significantly large and show signs of hyper granulation as well. She has had this kind of issue for quite some time and unfortunately just does not seem to be showing signs of significant improvement with what she has been using currently. No fevers, chills, nausea, vomiting, or diarrhea. She does attempt  appropriate offloading and again has been using the bed because that is all she has although she is not happy with it. 04/22/2019 on evaluation today patient appears to be doing about the same with regard to her wounds at this time. Fortunately there does not appear to be any signs of active infection at this point which is good news. She has been tolerating the dressing changes without complication. Overall I am pleased with that with that being said unfortunately she does have a little bit of more significant breakdown with regard to the wound bed upon evaluation today. I believe this is secondary to the fact that she has been lying potentially too much in a single position for too long over the past several weeks. I think that potentially if she were to mitigate this to a degree that things may improve somewhat. With that being said she is really not happy with her current bed she is attempted to have them come out and switch this back to an air mattress but unfortunately has not been successful in getting them to come out and make this change. She is very frustrated with that. She still has the Clinitron bed and she much prefers just having the standard air mattress. Patient's primary care provider still working on this she tells me. 05/08/2019 on evaluation today patient unfortunately is not doing as well as I would like to see in regard to her wounds. She actually is measuring a little bit larger pretty much at all wound locations unfortunately and overall I am very upset as well with the fact that she has been trying to get the Clinitron bed removed from her home but is not having any luck as such. We did get the number for the company today in order for [place] to see about calling them and switching this over to an air mattress as this is giving her a lot of trouble she states is really hot and she feels like it is breaking down her skin. This is not good. 05/22/2019 on evaluation today patient  appears to be doing well with regard to the wounds she is showing some signs of slight improvement at this point. This is good news. Fortunately there is no evidence of active infection at this time which is also good news. No fevers, chills, nausea, vomiting, or diarrhea. She does tell me that in order to get rid of the Clinitron bed that she has been working on that the representative from the company stated they needed in order for me asked to be getting rid of it and then what we wanted her to  have. Again I will be more than happy to do that we can initiate an air mattress for her. 06/06/2019 on evaluation today patient continues to have issues with her wounds. The good news is we figured out what to be done for the her bad as far as get not moved however she is getting actually ready to move and change residences. For that reason what they are going to do is come in and take the bed from the current residence to get rid of the Clinitron bed and then subsequently move the new bed into her new place of residence. This makes complete sense to me as well and I think that sounds like a good plan. At least we have a plan for what to do as far as getting things taken care of here. 06/24/2019 on evaluation today patient appears to be doing really about the same in some regards although a couple of the wounds are measuring a little bit larger and deeper compared to previous. With that being said I do think that unfortunately her wounds seem to be overall progressing in the wrong direction towards being worse not better. The patient is still having to use the Clinitron bed at this time I do think that she really needs to switch to just a standard air mattress that is what she wants but she is having to wait until she moves in 2 weeks before she can get the new mattress so she does not have to pay to have the bed moved twice. 07/18/2019 on evaluation today patient appears to actually be doing somewhat better  in regard to her wounds in general. I am very pleased in this regard. With that being said she still has some hyper granulation unfortunately the Hydrofera Blue just was getting stuck to her and the silver cell has done better. Nonetheless I do believe that we do need to probably treat this with some silver nitrate to try to help with the hypergranulation I discussed this with the patient today. She is in agreement with this plan. [name], [name] ([id number]) 07/29/2019 on evaluation today patient seems to be making some progress albeit slow regard to her wounds. Fortunately there is no signs of active infection at this time. No fever chills noted. She overall seems to be doing quite well at this point. 08/12/2019 upon evaluation today patient appears to be doing about the same at this point in regard to her wounds. She still has some hyper granulation. I really feel like she would do better with Hydrofera Blue over the alginate itself. With that being said we have not been able to use that for a while as it was sticking but again it has been sometime she was having issues with a different bed at that time as well which may have contributed to some of the issues at that point. Fortunately there is no evidence of active infection at this time. No fevers, chills, nausea, vomiting, or diarrhea. 09/04/2019 upon evaluation today patient actually appears to be doing excellent in regard to her wounds across the board. I am extremely pleased with how things appear today. She has a lot of new epithelization, good granulation, and overall she states that she is doing very well with the current bed. Everything about today seems to be on the up and up. 09/25/2019 upon evaluation today patient appears to be doing excellent in regard to her wounds. She has been tolerating the dressing changes without complication. Fortunately there is no evidence  of active infection at this time. No fevers, chills, nausea, vomiting,  or diarrhea. Overall she is actually been making great progress since getting off of the air-fluidized mattress I am happy in that regard. 10/17/2019 upon evaluation today patient appears to be doing well at this point in regard to her wounds. She is making good progress at this time which is good news. Fortunately there is no signs of active infection at this time. No fevers, chills, nausea, vomiting, or diarrhea. 10/31/2019 upon evaluation today patient actually appears to be doing excellent in regard to her wounds currently. I feel like the [facility] is doing a great job. Overall very pleased with the progress she is making. 11/21/2019; patient has wounds on her lower sacrum and right greater trochanter posteriorly. The wounds are epithelializing. No debridement was required. She has a level 3 surface for offloading. She tells [place] that she spends mostly all day in bed. She is a cervical level quadriplegia. 12/09/2019 upon evaluation today patient appears to be doing excellent in regard to her wounds. She has been tolerating the dressing changes without complication. Fortunately there does not appear to be any signs of active infection which is great news and overall I am extremely pleased. I think her wounds are getting closer to complete resolution. 12/26/2019 upon evaluation today patient appears to be doing okay regard to her wounds currently. Fortunately there is no signs of active infection at this time. No fever chills noted. She has been taking care of of the wounds decently well but unfortunately is having some issues currently with staying up a little bit too much I think this has led to the wounds not being quite as good as what we were hoping for but nonetheless I think that she is still doing fairly well. 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 For about 4-5  months she's had a decubitus ulcer on the right scapular region. she was lost to follow-up for the last 6 months. she had a couple of small areas which have opened out in the region of previous scar tissue in the sacral region but these haveall healed now. She does not have any other significant comorbidities. She has a good Roho cushion for her wheelchair and she has a specialized mattress for sleep. She has restarted smoking and smokes about half a pack of cigarettes a day 02/18/2015 -- X-ray of the pelvis -- IMPRESSION:No acute osseous injury of the pelvis. If there is clinical concern regarding osteomyelitis, recommend further evaluation with CT. X-ray of the sacrum and coccyx IMPRESSION:No acute osseous abnormality of the sacrum. If there is further clinical concern regarding osteomyelitis of the sacrum, further evaluation with CT is recommended. ========= Objective Constitutional Thin and well-hydrated in no acute distress. Vitals Time Taken: 2:15 PM, Height: 63 in, Weight: 70 lbs, BMI: 12.4, Temperature: 98.0 F, Pulse: 74 bpm, Respiratory Rate: 16 breaths/min, Blood Pressure: 125/77 mmHg. Respiratory normal breathing without difficulty. Psychiatric this patient is able to make decisions and demonstrates good insight into disease process. Alert and Oriented x 3. pleasant and cooperative. General Notes: Upon inspection today patient's wounds currently showed signs of good granulation at this time there does not appear to be any evidence of active infection which is great news. No fevers, chills, nausea, vomiting, or diarrhea. Integumentary (Hair, Skin) Wound #5 status is Open. Original cause of wound was Gradually Appeared. The  wound is located on the Right Sacrum. The wound measures Kerryn, Tennant [name] ([id number]) 6.5cm length x 2.5cm width x 0.1cm depth; 12.763cm^2 area and 1.276cm^3 volume. There is Fat Layer (Subcutaneous Tissue) Exposed exposed. There is a medium amount of serous  drainage noted. The wound margin is flat and intact. There is large (67-100%) pink, hyper - granulation within the wound bed. There is a small (1-33%) amount of necrotic tissue within the wound bed including Adherent Slough. Wound #7 status is Open. Original cause of wound was Gradually Appeared. The wound is located on the Right Trochanter. The wound measures 2.5cm length x 3.5cm width x 0.1cm depth; 6.872cm^2 area and 0.687cm^3 volume. There is Fat Layer (Subcutaneous Tissue) Exposed exposed. There is a medium amount of serous drainage noted. The wound margin is flat and intact. There is large (67-100%) red, hyper - granulation within the wound bed. There is a small (1-33%) amount of necrotic tissue within the wound bed including Adherent Slough. 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 May Shower, gently pat wound dry prior to applying new dressing. Wound #7 Right Trochanter: Clean wound with Normal Saline. May Shower, gently pat wound dry prior to applying new dressing. Primary Wound Dressing: Wound #5 Right Sacrum: Hydrafera Blue Ready Transfer - Please order Hydrofera Blue Ready Transfer specifically for patient r/t wound location and wet with saline or warm soapy water prior to removal Wound #7 Right Trochanter: Hydrafera Blue Ready Transfer - Please order Hydrofera Blue Ready Transfer specifically for patient r/t wound location and wet with saline or warm soapy water prior to removal Secondary Dressing: Wound #5 Right Sacrum: ABD pad - Secure with tape or bordered foam dressing Wound #7 Right Trochanter: ABD pad - Secure with tape or bordered foam dressing Dressing Change Frequency: Wound #5 Right Sacrum: Change Dressing  Monday, Wednesday, Friday - or three times weekly Wound #7 Right Trochanter: Change Dressing Monday, Wednesday, Friday - or three times weekly Follow-up Appointments: Wound #5 Right Sacrum: Return Appointment in 2 weeks. Wound #7 Right Trochanter: Return Appointment in 2 weeks. Off-Loading: Wound #5 Right Sacrum: Mattress - air mattress or fluidized air mattress Turn and reposition every 2 hours Wound #7 Right Trochanter: Mattress - air mattress or fluidized air mattress Turn and reposition every 2 hours Additional Orders / Instructions: Wound #5 Right Sacrum: Increase protein intake. Increase protein intake. Wound #7 Right Trochanter: Increase protein intake. Increase protein intake. Home Health: Wound #5 Right Sacrum: Continue Home Health Visits - [facility] - Please provide dressing materials for patient Home Health Nurse may visit PRN to address patient s wound care needs. [name], [name] ([id number]) FACE TO FACE ENCOUNTER: MEDICARE and MEDICAID PATIENTS: I certify that this patient is under my care and that I had a face-to-face encounter that meets the physician face-to-face encounter requirements with this patient on this date. The encounter with the patient was in whole or in part for the following MEDICAL CONDITION: (primary reason for Home Healthcare) MEDICAL NECESSITY: I certify, that based on my findings, NURSING services are a medically necessary home health service. HOME BOUND STATUS: I certify that my clinical findings support that this patient is homebound (i.e., Due to illness or injury, pt requires aid of supportive devices such as crutches, cane, wheelchairs, walkers, the use of  special transportation or the assistance of another person to leave their place of residence. There is a normal inability to leave the home and doing so requires considerable and taxing effort. Other absences are for medical reasons / religious services and are infrequent or of  short duration when for other reasons). If current dressing causes regression in wound condition, may D/C ordered dressing product/s and apply Normal Saline Moist Dressing daily until next Wound Healing Center / Other MD appointment. Notify Wound Healing Center of regression in wound condition at [phone]. Please direct any NON-WOUND related issues/requests for orders to patient's Primary Care Physician Wound #7 Right Trochanter: Continue Home Health Visits - [facility] - Please provide dressing materials for patient Home Health Nurse may visit PRN to address patient s wound care needs. FACE TO FACE ENCOUNTER: MEDICARE and MEDICAID PATIENTS: I certify that this patient is under my care and that I had a face-to-face encounter that meets the physician face-to-face encounter requirements with this patient on this date. The encounter with the patient was in whole or in part for the following MEDICAL CONDITION: (primary reason for Home Healthcare) MEDICAL NECESSITY: I certify, that based on my findings, NURSING services are a medically necessary home health service. HOME BOUND STATUS: I certify that my clinical findings support that this patient is homebound (i.e., Due to illness or injury, pt requires aid of supportive devices such as crutches, cane, wheelchairs, walkers, the use of special transportation or the assistance of another person to leave their place of residence. There is a normal inability to leave the home and doing so requires considerable and taxing effort. Other absences are for medical reasons / religious services and are infrequent or of short duration when for other reasons). If current dressing causes regression in wound condition, may D/C ordered dressing product/s and apply Normal Saline Moist Dressing daily until next Wound Healing Center / Other MD appointment. Notify Wound Healing Center of regression in wound condition at [phone]. Please direct any NON-WOUND related  issues/requests for orders to patient's Primary Care Physician To get this1. I would recommend currently that we go ahead and [facility] I believe do believe this is doing a great job for her. 2. I am also can recommend that she needs to continue with aggressive offloading I think does not be of utmost importance. 3. I would also recommend that she continue to utilize the air mattress that seems to be of great benefit for her which is also excellent news. Electronic Signature(s) Signed: 12/26/2019 2:56:16 PM By: Lenda KelpStone III, Andrena Margerum PA-C Entered By: Lenda KelpStone III, Wasil Wolke on 12/26/2019 14:56:15 Crystal, [name] ([id number]) -------------------------------------------------------------------------------- SuperBill Details Patient Name: [name] MastersWADE, Jaycelyn M. Date of Service: 12/26/2019 Medical Record Number: [id number] Patient Account Number: [id number] Date of Birth/Sex: 03-05-1966 (54 y.o. F) Treating RN: Curtis Sitesorthy, Joanna Primary Care Provider: [place], JULIE Other Clinician: Referring Provider: [place], JULIE Treating Provider/Extender: STONE III, Darionna Banke Weeks in Treatment: 37 Diagnosis Coding ICD-10 Codes Code Description L89.313 Pressure ulcer of right buttock, stage 3 L89.323 Pressure ulcer of left buttock, stage 3 L89.213 Pressure ulcer of right hip, stage 3 G82.52 Quadriplegia, C1-C4 incomplete E44.0 Moderate protein-calorie malnutrition Facility Procedures CPT4 Code: [id number] Description: 99213 - WOUND CARE VISIT-LEV 3 EST PT Modifier: Quantity: 1 Physician Procedures CPT4 Code: [id number]: [id number] Description: 99213 - WC PHYS LEVEL 3 - EST PT Modifier: Quantity: 1 CPT4 Code: Description: ICD-10 Diagnosis Description L89.313 Pressure ulcer of right buttock, stage 3 L89.323 Pressure ulcer of left buttock, stage 3 O53.664L89.213  Pressure ulcer of right hip, stage 3 G82.52 Quadriplegia, C1-C4 incomplete Modifier: Quantity: Electronic Signature(s) Signed: 12/26/2019 2:56:37 PM By: Worthy Keeler PA-C Entered By: Worthy Keeler on 12/26/2019 14:56:37

## 2019-12-26 NOTE — Progress Notes (Signed)
[name] ([id number]) Visit Report for 12/26/2019 Arrival Information Details Patient Name: [name], [name] Date of Service: 12/26/2019 2:15 PM Medical Record Number: [id number] Patient Account Number: [id number] Date of Birth/Sex: [birth year] (54 y.o. F) Treating RN: Rodell Perna Primary Care Ike Maragh: [place], [place]ULIE Other Clinician: Referring Saunders [place]: [place], [place] Treating Estevon Fluke/Extender: Linwood Dibbles, HOYT Weeks in Treatment: 37 Visit Information History Since Last Visit Added or deleted any medications: No Patient Arrived: Wheel Chair Any new allergies or adverse reactions: No Arrival Time: 14:14 Had a fall or experienced change in No Accompanied By: family activities of daily living that may affect Transfer Assistance: None risk of falls: Patient Identification Verified: Yes Signs or symptoms of abuse/neglect since last visito No Patient Requires Transmission-Based Precautions: No Hospitalized since last visit: No Patient Has Alerts: No Has Dressing in Place as Prescribed: Yes Pain Present Now: No Electronic Signature(s) Signed: 12/26/2019 3:00:39 PM By: Rodell Perna Entered By: Rodell Perna on 12/26/2019 14:15:07 [name], [name] ([id number]) -------------------------------------------------------------------------------- Clinic Level of Care Assessment Details Patient Name: [name] Date of Service: 12/26/2019 2:15 PM Medical Record Number: [id number] Patient Account Number: [id number] Date of Birth/Sex: [birth year] (54 y.o. F) Treating RN: Curtis Sites Primary Care Tecla Mailloux: [place], JULIE Other Clinician: Referring Avery Klingbeil: [place], [place] Treating Alissah Redmon/Extender: Linwood Dibbles, HOYT Weeks in Treatment: 37 Clinic Level of Care Assessment Items TOOL 4 Quantity Score []  - Use when only an EandM is performed on FOLLOW-UP visit 0 ASSESSMENTS - Nursing Assessment / Reassessment X - Reassessment of Co-morbidities (includes updates in patient status) 1  10 X- 1 5 Reassessment of Adherence to Treatment Plan ASSESSMENTS - Wound and Skin Assessment / Reassessment []  - Simple Wound Assessment / Reassessment - one wound 0 X- 2 5 Complex Wound Assessment / Reassessment - multiple wounds []  - 0 Dermatologic / Skin Assessment (not related to wound area) ASSESSMENTS - Focused Assessment []  - Circumferential Edema Measurements - multi extremities 0 []  - 0 Nutritional Assessment / Counseling / Intervention []  - 0 Lower Extremity Assessment (monofilament, tuning fork, pulses) []  - 0 Peripheral Arterial Disease Assessment (using hand held doppler) ASSESSMENTS - Ostomy and/or Continence Assessment and Care []  - Incontinence Assessment and Management 0 []  - 0 Ostomy Care Assessment and Management (repouching, etc.) PROCESS - Coordination of Care X - Simple Patient / Family Education for ongoing care 1 15 []  - 0 Complex (extensive) Patient / Family Education for ongoing care X- 1 10 Staff obtains , Records, Test Results / Process Orders []  - 0 Staff telephones HHA, Nursing Homes / Clarify orders / etc []  - 0 Routine Transfer to another Facility (non-emergent condition) []  - 0 Routine Hospital Admission (non-emergent condition) []  - 0 New Admissions / / Ordering NPWT, Apligraf, etc. []  - 0 Emergency Hospital Admission (emergent condition) X- 1 10 Simple Discharge Coordination []  - 0 Complex (extensive) Discharge Coordination PROCESS - Special Needs []  - Pediatric / Minor Patient Management 0 []  - 0 Isolation Patient Management []  - 0 Hearing / Language / Visual special needs []  - 0 Assessment of Community assistance (transportation, D/C planning, etc.) []  - 0 Additional assistance / Altered mentation []  - 0 Support Surface(s) Assessment (bed, cushion, seat, etc.) INTERVENTIONS - Wound Cleansing / Measurement Kary, Lakiya M. ( ) []  - 0 Simple Wound Cleansing - one wound X- 2  5 Complex Wound Cleansing - multiple wounds X- 1 5 Wound Imaging (photographs - any number of wounds) []  - 0 Wound Tracing (instead of photographs) []  -  0 Simple Wound Measurement - one wound X- 2 5 Complex Wound Measurement - multiple wounds INTERVENTIONS - Wound Dressings X - Small Wound Dressing one or multiple wounds 2 10 []  - 0 Medium Wound Dressing one or multiple wounds []  - 0 Large Wound Dressing one or multiple wounds []  - 0 Application of Medications - topical []  - 0 Application of Medications - injection INTERVENTIONS - Miscellaneous []  - External ear exam 0 []  - 0 Specimen Collection (cultures, biopsies, blood, body fluids, etc.) []  - 0 Specimen(s) / Culture(s) sent or taken to Lab for analysis []  - 0 Patient Transfer (multiple staff / / Similar devices) []  - 0 Simple Staple / Suture removal (25 or less) []  - 0 Complex Staple / Suture removal (26 or more) []  - 0 Hypo / Hyperglycemic Management (close monitor of Blood Glucose) []  - 0 Ankle / Brachial Index (ABI) - do not check if billed separately X- 1 5 Vital Signs Has the patient been seen at the hospital within the last three years: Yes Total Score: 110 Level Of Care: New/Established - Level 3 Electronic Signature(s) Signed: 12/26/2019 3:40:28 PM By: Entered By: on 12/26/2019 14:26:25 Elks, ( ) -------------------------------------------------------------------------------- Encounter Discharge Information Details Patient Name: [name], [name] Date of Service: 12/26/2019 2:15 PM Medical Record Number: Patient Account Number: Date of Birth/Sex: [birth year] (54 y.o. F) Treating RN: 12/28/2019 Primary Care Micalah Cabezas: Curtis Sites, JULIE Other Clinician: Referring Naveed Humphres: Curtis Sites, JULIE Treating Linton Stolp/Extender: 12/28/2019, HOYT Weeks in Treatment: 37 Encounter Discharge Information Items Discharge Condition:  Stable Ambulatory Status: Wheelchair Discharge Destination: Home Transportation: Private Auto Accompanied By: spouse Schedule Follow-up Appointment: Yes Clinical Summary of Care: Electronic Signature(s) Signed: 12/26/2019 2:39:16 PM By: [id number] Entered By: [name] on 12/26/2019 14:39:16 Bhandari, [id number] ([id number]) -------------------------------------------------------------------------------- Lower Extremity Assessment Details Patient Name: [name], [name] Date of Service: 12/26/2019 2:15 PM Medical Record Number: Curtis Sites Patient Account Number: [place] Date of Birth/Sex: 07-10-1965 (54 y.o. F) Treating RN: 12/28/2019 Primary Care Alivya Wegman: Curtis Sites, JULIE Other Clinician: Referring Thatcher Doberstein: Curtis Sites, JULIE Treating Jayanna Kroeger/Extender: 12/28/2019, HOYT Weeks in Treatment: 37 Electronic Signature(s) Signed: 12/26/2019 3:00:39 PM By: [id number] Entered By: [name] on 12/26/2019 14:21:00 Schreiner, [id number] ([id number]) -------------------------------------------------------------------------------- Multi Wound Chart Details Patient Name: [name], [name] Date of Service: 12/26/2019 2:15 PM Medical Record Number: Rodell Perna Patient Account Number: [place] Date of Birth/Sex: March 28, 1966 (54 y.o. F) Treating RN: 12/28/2019 Primary Care Markitta Ausburn: Rodell Perna, JULIE Other Clinician: Referring Insiya Oshea: Rodell Perna, JULIE Treating Maddex Garlitz/Extender: STONE III, HOYT Weeks in Treatment: 37 Vital Signs Height(in): 63 Pulse(bpm): 74 Weight(lbs): 70 Blood Pressure(mmHg): 125/77 Body Mass Index(BMI): 12 Temperature(F): 98.0 Respiratory Rate(breaths/min): 16 Photos: [N/A:N/A] Wound Location: Right Sacrum Right Trochanter N/A Wounding Event: Gradually Appeared Gradually Appeared N/A Primary Etiology: Pressure Ulcer Pressure Ulcer N/A Comorbid History: Asthma, Chronic Obstructive Asthma, Chronic Obstructive N/A Pulmonary Disease (COPD), History Pulmonary Disease  (COPD), History of pressure wounds, Paraplegia of pressure wounds, Paraplegia Date Acquired: 10/16/2018 04/02/2018 N/A Weeks of Treatment: 37 37 N/A Wound Status: Open Open N/A Clustered Wound: Yes No N/A Clustered Quantity: 5 N/A N/A Measurements L x W x D (cm) 6.5x2.5x0.1 2.5x3.5x0.1 N/A Area (cm) : 12.763 6.872 N/A Volume (cm) : 1.276 0.687 N/A % Reduction in Area: 72.90% 56.30% N/A % Reduction in Volume: 96.10% 95.10% N/A Classification: Category/Stage III Category/Stage III N/A Exudate Amount: Medium Medium N/A Exudate Type: Serous Serous N/A Exudate Color: amber amber N/A Wound Margin: Flat and  Intact Flat and Intact N/A Granulation Amount: Large (67-100%) Large (67-100%) N/A Granulation Quality: Pink, Hyper-granulation Red, Hyper-granulation N/A Necrotic Amount: Small (1-33%) Small (1-33%) N/A Exposed Structures: Fat Layer (Subcutaneous Tissue) Fat Layer (Subcutaneous Tissue) N/A Exposed: Yes Exposed: Yes Fascia: No Fascia: No Tendon: No Tendon: No Muscle: No Muscle: No Joint: No Joint: No Bone: No Bone: No Epithelialization: Large (67-100%) Small (1-33%) N/A Treatment Notes Electronic Signature(s) Signed: 12/26/2019 3:40:28 PM By: Curtis Sites Entered By: Curtis Sites on 12/26/2019 14:25:19 Pernell, [name] ([id number]) Schraeder, [name] ([id number]) -------------------------------------------------------------------------------- Multi-Disciplinary Care Plan Details Patient Name: [name], [name] Date of Service: 12/26/2019 2:15 PM Medical Record Number: [id number] Patient Account Number: [id number] Date of Birth/Sex: Jun 17, 1966 (54 y.o. F) Treating RN: Curtis Sites Primary Care Taryne Kiger: [place], JULIE Other Clinician: Referring Alexandera Kuntzman: [place], [place] Treating Kimm Ungaro/Extender: STONE III, HOYT Weeks in Treatment: 37 Active Inactive Abuse / Safety / Falls / Self Care Management Nursing Diagnoses: Impaired physical mobility Goals: Patient will not  develop complications from immobility Date Initiated: 04/08/2019 Target Resolution Date: 07/12/2019 Goal Status: Active Interventions: Assess fall risk on admission and as needed Notes: Nutrition Nursing Diagnoses: Potential for alteratiion in Nutrition/Potential for imbalanced nutrition Goals: Patient/caregiver agrees to and verbalizes understanding of need to use nutritional supplements and/or vitamins as prescribed Date Initiated: 04/08/2019 Target Resolution Date: 07/12/2019 Goal Status: Active Interventions: Assess patient nutrition upon admission and as needed per policy Notes: Pressure Nursing Diagnoses: Knowledge deficit related to management of pressures ulcers Goals: Patient will remain free of pressure ulcers Date Initiated: 04/08/2019 Target Resolution Date: 07/12/2019 Goal Status: Active Interventions: Assess potential for pressure ulcer upon admission and as needed Notes: Wound/Skin Impairment Nursing Diagnoses: Impaired tissue integrity Goals: Ulcer/skin breakdown will heal within 14 weeks Date Initiated: 04/08/2019 Target Resolution Date: 07/12/2019 Goal Status: Active [name], [name] ([id number]) Interventions: Assess patient/caregiver ability to obtain necessary supplies Assess patient/caregiver ability to perform ulcer/skin care regimen upon admission and as needed Assess ulceration(s) every visit Notes: Electronic Signature(s) Signed: 12/26/2019 3:40:28 PM By: Curtis Sites Entered By: Curtis Sites on 12/26/2019 14:25:09 Beitzel, [name] ([id number]) -------------------------------------------------------------------------------- Pain Assessment Details Patient Name: [name] Date of Service: 12/26/2019 2:15 PM Medical Record Number: [id number] Patient Account Number: [id number] Date of Birth/Sex: 05-02-1966 (54 y.o. F) Treating RN: Rodell Perna Primary Care Railey Glad: [place], [place]ULIE Other Clinician: Referring Eyla Tallon: [place], [place] Treating  Shiloh Southern/Extender: STONE III, HOYT Weeks in Treatment: 37 Active Problems Location of Pain Severity and Description of Pain Patient Has Paino No Site Locations Pain Management and Medication Current Pain Management: Electronic Signature(s) Signed: 12/26/2019 3:00:39 PM By: Rodell Perna Entered By: Rodell Perna on 12/26/2019 14:15:34 Camilli, [name] ([id number]) -------------------------------------------------------------------------------- Patient/Caregiver Education Details Patient Name: [name], [name] Date of Service: 12/26/2019 2:15 PM Medical Record Number: [id number] Patient Account Number: [id number] Date of Birth/Gender: 07-28-1965 (54 y.o. F) Treating RN: Curtis Sites Primary Care Physician: [place], JULIE Other Clinician: Referring Physician: [place], [place]ULIE Treating Physician/Extender: Skeet Simmer in Treatment: 83 Education Assessment Education Provided To: Patient and Caregiver Education Topics Provided Wound/Skin Impairment: Handouts: Other: wound care and pressure relief Methods: Demonstration, Explain/Verbal Responses: State content correctly Electronic Signature(s) Signed: 12/26/2019 3:40:28 PM By: Curtis Sites Entered By: Curtis Sites on 12/26/2019 14:26:52 Mamula, [name] M. ([id number]) -------------------------------------------------------------------------------- Wound Assessment Details Patient Name: [name] Date of Service: 12/26/2019 2:15 PM Medical Record Number: [id number] Patient Account Number: [id number] Date of Birth/Sex: [birth year] (55 y.o. F) Treating RN: Rodell Perna Primary Care Dezire Turk: [place], JULIE Other Clinician: Referring  Shiza Thelen: [place], [place] Treating Cassady Turano/Extender: STONE III, HOYT Weeks in Treatment: 37 Wound Status Wound Number: 5 Primary Pressure Ulcer Etiology: Wound Location: Right Sacrum Wound Open Wounding Event: Gradually Appeared Status: Date Acquired: 10/16/2018 Comorbid Asthma,  Chronic Obstructive Pulmonary Disease (COPD), Weeks Of Treatment: 37 History: History of pressure wounds, Paraplegia Clustered Wound: Yes Photos Wound Measurements Length: (cm) 6.5 Width: (cm) 2.5 Depth: (cm) 0.1 Clustered Quantity: 5 Area: (cm) 12.763 Volume: (cm) 1.276 % Reduction in Area: 72.9% % Reduction in Volume: 96.1% Epithelialization: Large (67-100%) Wound Description Classification: Category/Stage III Foul Wound Margin: Flat and Intact Sloug Exudate Amount: Medium Exudate Type: Serous Exudate Color: amber Odor After Cleansing: No h/Fibrino Yes Wound Bed Granulation Amount: Large (67-100%) Exposed Structure Granulation Quality: Pink, Hyper-granulation Fascia Exposed: No Necrotic Amount: Small (1-33%) Fat Layer (Subcutaneous Tissue) Exposed: Yes Necrotic Quality: Adherent Slough Tendon Exposed: No Muscle Exposed: No Joint Exposed: No Bone Exposed: No Treatment Notes Wound #5 (Right Sacrum) Notes hydrofera blue, abd and tape [name], [name] ([id number]) Electronic Signature(s) Signed: 12/26/2019 3:00:39 PM By: Army Melia Entered By: Army Melia on 12/26/2019 14:20:27 [name], [name] ([id number]) -------------------------------------------------------------------------------- Wound Assessment Details Patient Name: [name] Date of Service: 12/26/2019 2:15 PM Medical Record Number: [id number] Patient Account Number: [id number] Date of Birth/Sex: 1966/07/01 (54 y.o. F) Treating RN: Army Melia Primary Care Wilson Sample: [place], JULIE Other Clinician: Referring Dezirea Mccollister: [place], [place] Treating Eleah Lahaie/Extender: STONE III, HOYT Weeks in Treatment: 37 Wound Status Wound Number: 7 Primary Pressure Ulcer Etiology: Wound Location: Right Trochanter Wound Open Wounding Event: Gradually Appeared Status: Date Acquired: 04/02/2018 Comorbid Asthma, Chronic Obstructive Pulmonary Disease (COPD), Weeks Of Treatment: 37 History: History of pressure  wounds, Paraplegia Clustered Wound: No Photos Wound Measurements Length: (cm) 2.5 Width: (cm) 3.5 Depth: (cm) 0.1 Area: (cm) 6.872 Volume: (cm) 0.687 % Reduction in Area: 56.3% % Reduction in Volume: 95.1% Epithelialization: Small (1-33%) Wound Description Classification: Category/Stage III Foul O Wound Margin: Flat and Intact Slough Exudate Amount: Medium Exudate Type: Serous Exudate Color: amber dor After Cleansing: No /Fibrino No Wound Bed Granulation Amount: Large (67-100%) Exposed Structure Granulation Quality: Red, Hyper-granulation Fascia Exposed: No Necrotic Amount: Small (1-33%) Fat Layer (Subcutaneous Tissue) Exposed: Yes Necrotic Quality: Adherent Slough Tendon Exposed: No Muscle Exposed: No Joint Exposed: No Bone Exposed: No Treatment Notes Wound #7 (Right Trochanter) Notes hydrofera blue, abd and tape Electronic Signature(s) Signed: 12/26/2019 3:00:39 PM By: Kalman Jewels ([id number]) Entered By: Army Melia on 12/26/2019 14:20:47 Bjorn, [name] ([id number]) -------------------------------------------------------------------------------- Vitals Details Patient Name: [name] Date of Service: 12/26/2019 2:15 PM Medical Record Number: [id number] Patient Account Number: [id number] Date of Birth/Sex: 10/21/1965 (54 y.o. F) Treating RN: Army Melia Primary Care Kiriana Worthington: [place], JULIE Other Clinician: Referring Adelyn Roscher: [place], [place] Treating Lunell Robart/Extender: STONE III, HOYT Weeks in Treatment: 37 Vital Signs Time Taken: 14:15 Temperature (F): 98.0 Height (in): 63 Pulse (bpm): 74 Weight (lbs): 70 Respiratory Rate (breaths/min): 16 Body Mass Index (BMI): 12.4 Blood Pressure (mmHg): 125/77 Reference Range: 80 - 120 mg / dl Electronic Signature(s) Signed: 12/26/2019 3:00:39 PM By: Army Melia Entered By: Army Melia on 12/26/2019 14:15:21

## 2019-12-29 ENCOUNTER — Encounter: Payer: Self-pay | Admitting: Emergency Medicine

## 2019-12-29 ENCOUNTER — Emergency Department
Admission: EM | Admit: 2019-12-29 | Discharge: 2019-12-29 | Disposition: A | Discharge status: Home / Self Care | Payer: Medicare Other | Attending: Student in an Organized Health Care Education/Training Program | Admitting: Student in an Organized Health Care Education/Training Program

## 2019-12-29 ENCOUNTER — Other Ambulatory Visit: Payer: Self-pay

## 2019-12-29 DIAGNOSIS — H6121 Impacted cerumen, right ear: Secondary | ICD-10-CM | POA: Diagnosis not present

## 2019-12-29 DIAGNOSIS — Z20822 Contact with and (suspected) exposure to covid-19: Secondary | ICD-10-CM | POA: Diagnosis not present

## 2019-12-29 DIAGNOSIS — J029 Acute pharyngitis, unspecified: Secondary | ICD-10-CM | POA: Diagnosis not present

## 2019-12-29 DIAGNOSIS — H9201 Otalgia, right ear: Secondary | ICD-10-CM | POA: Diagnosis present

## 2019-12-29 DIAGNOSIS — J449 Chronic obstructive pulmonary disease, unspecified: Secondary | ICD-10-CM | POA: Insufficient documentation

## 2019-12-29 LAB — GROUP A STREP BY PCR: Group A Strep by PCR: NOT DETECTED

## 2019-12-29 LAB — SARS CORONAVIRUS 2 BY RT PCR (HOSPITAL ORDER, PERFORMED IN ~~LOC~~ HOSPITAL LAB): SARS Coronavirus 2: NEGATIVE

## 2019-12-29 MED ORDER — DEBROX 6.5 % OT SOLN: 5.0000 [drp] | Freq: Two times a day (BID) | OTIC | 0 refills | Status: AC

## 2019-12-29 MED ORDER — LIDOCAINE VISCOUS HCL 2 % MT SOLN
15.0000 mL | Freq: Once | OROMUCOSAL | Status: AC
  Administered 2019-12-29: 15 mL via OROMUCOSAL
  Filled 2019-12-29: qty 15

## 2019-12-29 NOTE — ED Provider Notes (Signed)
[facility] Emergency Department Provider Note  ____________________________________________  Time seen: Approximately 3:19 PM  I have reviewed the triage vital signs and the nursing notes.   HISTORY  Chief Complaint Sore Throat and Headache    HPI [name] is a 54 y.o. female that presents to emergency department for evaluation of sore throat and right ear pain since yesterday.  Patient states that pain is worse when she swallows.  Her radiology feels "stopped up." She had a headache earlier but none any longer.  The left side of her neck was "stiff."  Neck pain improved after some massage.  No sick contacts.  She has been vaccinated against COVID-19.  No fevers, cough, shortness of breath.  Past Medical History:  Diagnosis Date  . COPD (chronic obstructive pulmonary disease) (HCC)   . History of urostomy   . Paraplegic spinal paralysis [facility])     Patient Active Problem List   Diagnosis Date Noted  . Pneumonia 09/07/2016  . Pressure injury of skin 09/07/2016    History reviewed. No pertinent surgical history.  Prior to Admission medications   Medication Sig Start Date End Date Taking? Authorizing Provider  Albuterol Sulfate 108 (90 Base) MCG/ACT AEPB Inhale 1 puff into the lungs every 4 (four) hours as needed.    [provider]  Ascorbic Acid (VITAMIN C) 500 MG CHEW Chew 500 mg by mouth daily. 12/31/17   [provider]  baclofen (LIORESAL) 10 MG tablet Take 1 tablet (10 mg total) by mouth 3 (three) times daily. 10/15/18   Altamese Dilling, MD  beclomethasone (QVAR) 40 MCG/ACT inhaler Inhale 2 puffs into the lungs 2 (two) times daily.    [provider]  carbamide peroxide (DEBROX) 6.5 % OTIC solution Place 5 drops into the left ear 2 (two) times daily for 3 days. 12/29/19 01/01/20  Enid Derry, PA-C  feeding supplement, ENSURE ENLIVE, (ENSURE ENLIVE) LIQD Take 237 mLs by mouth 3 (three) times daily between meals.  09/09/16   Enedina Finner, MD  fluticasone (FLONASE) 50 MCG/ACT nasal spray Place 2 sprays into both nostrils daily. 08/27/15   [provider]  gabapentin (NEURONTIN) 100 MG capsule Take 1 capsule (100 mg total) by mouth 3 (three) times daily. 10/15/18   Altamese Dilling, MD  guaiFENesin (MUCINEX) 600 MG 12 hr tablet Take 1 tablet (600 mg total) by mouth 2 (two) times daily. 10/15/18   Altamese Dilling, MD  metoprolol tartrate (LOPRESSOR) 25 MG tablet Take 0.5 tablets (12.5 mg total) by mouth 2 (two) times daily. 10/15/18   Altamese Dilling, MD  mirtazapine (REMERON) 30 MG tablet Take 30 mg by mouth at bedtime. 05/24/16   [provider]  oxyCODONE-acetaminophen (PERCOCET) 10-325 MG tablet Take 1 tablet by mouth 3 (three) times daily. 09/03/18   [provider]  tiotropium (SPIRIVA HANDIHALER) 18 MCG inhalation capsule Place 18 mcg into inhaler and inhale daily. 08/18/16   [provider]    Allergies Shellfish allergy  No family history on file.  Social History Social History   Tobacco Use  . Smoking status: Never Smoker  . Smokeless tobacco: Never Used  Substance Use Topics  . Alcohol use: No  . Drug use: Not on file     Review of Systems  Constitutional: No fever/chills ENT: No upper respiratory complaints.  Positive for sore throat.  Positive for ear pain. Cardiovascular: No chest pain. Respiratory: No cough. No SOB. Gastrointestinal: No abdominal pain.  No nausea, no vomiting.  Musculoskeletal:  Negative for musculoskeletal pain. Skin: Negative for rash, abrasions, lacerations, ecchymosis. Neurological: Negative for numbness or tingling. Negative for current headache.   ____________________________________________   PHYSICAL EXAM:  VITAL SIGNS: ED Triage Vitals  Enc Vitals Group     BP 12/29/19 1231 112/80     Pulse Rate 12/29/19 1231 74     Resp 12/29/19 1231 17     Temp 12/29/19 1231 97.9 F (36.6 C)     Temp Source  12/29/19 1231 Oral     SpO2 12/29/19 1231 95 %     Weight 12/29/19 1232 87 lb (39.5 kg)     Height 12/29/19 1232 5\' 3"  (1.6 m)     Head Circumference --      Peak Flow --      Pain Score 12/29/19 1236 10     Pain Loc --      Pain Edu? --      Excl. in [place]? --      Constitutional: Alert and oriented. Well appearing and in no acute distress. Eyes: Conjunctivae are normal. PERRL. EOMI. Head: Atraumatic. ENT:      Ears: Cerumen to right ear canal.  Left tympanic membrane is pearly.      Nose: No congestion/rhinnorhea.      Mouth/Throat: Mucous membranes are moist.  Oropharynx mildly erythematous.  Tonsils not enlarged.  Uvula midline. Neck: No stridor. No cervical spine tenderness to palpation.  Full range of motion of neck.  Patient comments that palpation of left neck feels good. Cardiovascular: Normal rate, regular rhythm.  Good peripheral circulation. Respiratory: Normal respiratory effort without tachypnea or retractions. Lungs CTAB. Good air entry to the bases with no decreased or absent breath sounds. Gastrointestinal: Bowel sounds 4 quadrants. Soft and nontender to palpation. No guarding or rigidity. No palpable masses. No distention.  Musculoskeletal: Full range of motion to all extremities. No gross deformities appreciated. Neurologic:  Normal speech and language. No gross focal neurologic deficits are appreciated.  Skin:  Skin is warm, dry and intact. No rash noted. Psychiatric: Mood and affect are normal. Speech and behavior are normal. Patient exhibits appropriate insight and judgement.   ____________________________________________   LABS (all labs ordered are listed, but only abnormal results are displayed)  Labs Reviewed  GROUP A STREP BY PCR  SARS CORONAVIRUS 2 (HOSPITAL ORDER, [place] [facility])   ____________________________________________  EKG   ____________________________________________  RADIOLOGY   No results  found.  ____________________________________________    PROCEDURES  Procedure(s) performed:    .Ear Cerumen Removal  Date/Time: 12/29/2019 4:17 PM Performed by: Laban Emperor, PA-C Authorized by: Laban Emperor, PA-C   Consent:    Consent obtained:  Verbal   Consent given by:  Patient   Risks discussed:  Bleeding, infection, pain, incomplete removal, dizziness and TM perforation   Alternatives discussed:  No treatment, delayed treatment, alternative treatment, observation and referral Procedure details:    Location:  R ear   Procedure type: curette   Post-procedure details:    Inspection:  TM intact   Hearing quality:  Improved   Patient tolerance of procedure:  Tolerated well, no immediate complications      Medications  lidocaine (XYLOCAINE) 2 % viscous mouth solution 15 mL (15 mLs Mouth/Throat Given 12/29/19 1509)     ____________________________________________   INITIAL IMPRESSION / ASSESSMENT AND PLAN / ED COURSE  Pertinent labs & imaging results that were available during my care of the patient were reviewed by me and considered in my  medical decision making (see chart for details).  Review of the [place] CSRS was performed in accordance of the [facility] prior to dispensing any controlled drugs.     Patient presented the emergency department for evaluation of right ear pain and sore throat since last night.  Vital signs and exam are reassuring.  Strep test is negative.  Covid test is pending.  On exam, patient has impacted cerumen to the right ear canal.  Cerumen was removed in the emergency department and patient states that pain has significantly improved and she can hear again.  Sore throat improved with viscous lidocaine.  Patient does not wish to have a lidocaine prescription.  Patient will be discharged home with prescriptions for Debrox. Patient is to follow up with primary care as directed. Patient is given ED precautions to return to the ED for any worsening or  new symptoms.  PATRIC VANPELT was evaluated in Emergency Department on 12/29/2019 for the symptoms described in the history of present illness. She was evaluated in the context of the global COVID-19 pandemic, which necessitated consideration that the patient might be at risk for infection with the SARS-CoV-2 virus that causes COVID-19. Institutional protocols and algorithms that pertain to the evaluation of patients at risk for COVID-19 are in a state of rapid change based on information released by regulatory bodies including the CDC and federal and state organizations. These policies and algorithms were followed during the patient's care in the ED.   ____________________________________________  FINAL CLINICAL IMPRESSION(S) / ED DIAGNOSES  Final diagnoses:  Impacted cerumen of right ear  Pharyngitis, unspecified etiology      NEW MEDICATIONS STARTED DURING THIS VISIT:  ED Discharge Orders         Ordered    carbamide peroxide (DEBROX) 6.5 % OTIC solution  2 times daily     Discontinue  Reprint     12/29/19 1552              This chart was dictated using voice recognition software/Dragon. Despite best efforts to proofread, errors can occur which can change the meaning. Any change was purely unintentional.    Enid Derry, PA-C 12/29/19 1912    Willy Eddy, MD 12/30/19 [phone]

## 2019-12-29 NOTE — ED Notes (Signed)
See triage note  Presents with sore throat and right ear pain   States sxs' started yesterday   No fever  And is currently afebrile on arrival

## 2019-12-29 NOTE — ED Triage Notes (Signed)
Patient presents to the ED with headache and sore throat that began last night as well as some neck soreness.  Patient states pain is worse with swallowing.  Patient denies cough and fever.

## 2020-01-09 ENCOUNTER — Encounter: Payer: Medicare Other | Attending: Physician Assistant | Admitting: Physician Assistant

## 2020-01-09 ENCOUNTER — Other Ambulatory Visit: Payer: Self-pay

## 2020-01-09 DIAGNOSIS — L89313 Pressure ulcer of right buttock, stage 3: Secondary | ICD-10-CM | POA: Insufficient documentation

## 2020-01-09 DIAGNOSIS — L89323 Pressure ulcer of left buttock, stage 3: Secondary | ICD-10-CM | POA: Diagnosis not present

## 2020-01-09 DIAGNOSIS — G8254 Quadriplegia, C5-C7 incomplete: Secondary | ICD-10-CM | POA: Insufficient documentation

## 2020-01-09 DIAGNOSIS — Z681 Body mass index (BMI) 19 or less, adult: Secondary | ICD-10-CM | POA: Diagnosis not present

## 2020-01-09 DIAGNOSIS — E440 Moderate protein-calorie malnutrition: Secondary | ICD-10-CM | POA: Insufficient documentation

## 2020-01-09 DIAGNOSIS — G8252 Quadriplegia, C1-C4 incomplete: Secondary | ICD-10-CM | POA: Insufficient documentation

## 2020-01-09 DIAGNOSIS — J449 Chronic obstructive pulmonary disease, unspecified: Secondary | ICD-10-CM | POA: Diagnosis not present

## 2020-01-09 DIAGNOSIS — F17210 Nicotine dependence, cigarettes, uncomplicated: Secondary | ICD-10-CM | POA: Insufficient documentation

## 2020-01-09 DIAGNOSIS — L89213 Pressure ulcer of right hip, stage 3: Secondary | ICD-10-CM | POA: Insufficient documentation

## 2020-01-09 DIAGNOSIS — N319 Neuromuscular dysfunction of bladder, unspecified: Secondary | ICD-10-CM | POA: Diagnosis not present

## 2020-01-14 NOTE — Progress Notes (Addendum)
[name], [name] ([id number]) Visit Report for 01/09/2020 Chief Complaint Document Details Patient Name: [name], [name]. Date of Service: 01/09/2020 2:15 PM Medical Record Number: [id number] Patient Account Number: [id number] Date of Birth/Sex: 04/19/1966 (54 y.o. F) Treating RN: Huel Coventry Primary Care Provider: [place], JULIE Other Clinician: Referring Provider: [place], JULIE Treating Provider/Extender: Linwood Dibbles, Cameryn Schum Weeks in Treatment: 9 Information Obtained from: Patient Chief Complaint Bilateral gluteal and right hip pressure ulcers Electronic Signature(s) Signed: 01/09/2020 2:32:50 PM By: Lenda Kelp PA-C Entered By: Lenda Kelp on 01/09/2020 14:32:49 [name], [name] -------------------------------------------------------------------------------- HPI Details Patient Name: [name], [name]. Date of Service: 01/09/2020 2:15 PM Medical Record Number: [id number] Patient Account Number: [id number] Date of Birth/Sex: 1966/01/17 (54 y.o. F) Treating RN: Huel Coventry Primary Care Provider: [place], JULIE Other Clinician: Referring Provider: [place], JULIE Treating Provider/Extender: Linwood Dibbles, Miliani Deike Weeks in Treatment: 51 History of Present Illness HPI Description: 54 year old patient who was been an unreliable historian says she had completely healed in February and most recently her home health noticed that the wound on the right scapular area had reopened. She was in hospital about a month ago with pneumonia at that time a workup was done. We have no notes today but will workup the electronic medical record. on review of her records electronically I do not find any recent hospital MRI in the last 6 months. She had a CT scan of the chest in February 2018 which does not show any osseous lesions. she was admitted to the hospital between May 14 and May 17 for 3 days for mucus plugging and collapse of the left lung. at that time she had a decubitus ulcer of the right  scapular region stage III. her history was noted to have quadriplegia C5-C7 incomplete, neurogenic bladder, nephrostomy catheter and emphysema. patient tells me that she has not smoked for last month and a half but as per her hospital records she was smoking in the middle of May. 01/12/17 patient scapular region appears to be doing fairly well on evaluation today. She is having really no syndicate discomfort and has been tolerating the dressing changes well complication. Readmission: 04/08/2019 patient presents today for reevaluation in our clinic concerning issues she is having with wounds on the bilateral gluteal area as well as on the right trochanter. She has unfortunately noted that things seem to have gotten worse as she was using the Clinitron bed. The patient states it gives off a lot of heat and subsequently has not been good for her skin causing her to breakdown more. She has been trying to get back just to the air mattress but has not been able to get the company to come pick up that bed and bring out the air mattress. Her primary care provider is on this as well trying to get them to do so. Unfortunately her wounds are significantly large and show signs of hyper granulation as well. She has had this kind of issue for quite some time and unfortunately just does not seem to be showing signs of significant improvement with what she has been using currently. No fevers, chills, nausea, vomiting, or diarrhea. She does attempt appropriate offloading and again has been using the bed because that is all she has although she is not happy with it. 04/22/2019 on evaluation today patient appears to be doing about the same with regard to her wounds at this time. Fortunately there does not appear to be any signs of active infection at this point which  is good news. She has been tolerating the dressing changes without complication. Overall I am pleased with that with that being said unfortunately she does  have a little bit of more significant breakdown with regard to the wound bed upon evaluation today. I believe this is secondary to the fact that she has been lying potentially too much in a single position for too long over the past several weeks. I think that potentially if she were to mitigate this to a degree that things may improve somewhat. With that being said she is really not happy with her current bed she is attempted to have them come out and switch this back to an air mattress but unfortunately has not been successful in getting them to come out and make this change. She is very frustrated with that. She still has the Clinitron bed and she much prefers just having the standard air mattress. Patient's primary care provider still working on this she tells me. 05/08/2019 on evaluation today patient unfortunately is not doing as well as I would like to see in regard to her wounds. She actually is measuring a little bit larger pretty much at all wound locations unfortunately and overall I am very upset as well with the fact that she has been trying to get the Clinitron bed removed from her home but is not having any luck as such. We did get the number for the company today in order for [place] to see about calling them and switching this over to an air mattress as this is giving her a lot of trouble she states is really hot and she feels like it is breaking down her skin. This is not good. 05/22/2019 on evaluation today patient appears to be doing well with regard to the wounds she is showing some signs of slight improvement at this point. This is good news. Fortunately there is no evidence of active infection at this time which is also good news. No fevers, chills, nausea, vomiting, or diarrhea. She does tell me that in order to get rid of the Clinitron bed that she has been working on that the representative from the company stated they needed in order for me asked to be getting rid of it and then  what we wanted her to have. Again I will be more than happy to do that we can initiate an air mattress for her. 06/06/2019 on evaluation today patient continues to have issues with her wounds. The good news is we figured out what to be done for the her bad as far as get not moved however she is getting actually ready to move and change residences. For that reason what they are going to do is come in and take the bed from the current residence to get rid of the Clinitron bed and then subsequently move the new bed into her new place of residence. This makes complete sense to me as well and I think that sounds like a good plan. At least we have a plan for what to do as far as getting things taken care of here. 06/24/2019 on evaluation today patient appears to be doing really about the same in some regards although a couple of the wounds are measuring a little bit larger and deeper compared to previous. With that being said I do think that unfortunately her wounds seem to be overall progressing in the wrong direction towards being worse not better. The patient is still having to use the Clinitron bed at  this time I do think that she really needs to switch to just a standard air mattress that is what she wants but she is having to wait until she moves in 2 weeks before she can get the new mattress so she does not have to pay to have the bed moved twice. 07/18/2019 on evaluation today patient appears to actually be doing somewhat better in regard to her wounds in general. I am very pleased in this regard. With that being said she still has some hyper granulation unfortunately the Hydrofera Blue just was getting stuck to her and the silver cell has done better. Nonetheless I do believe that we do need to probably treat this with some silver nitrate to try to help with the hypergranulation I discussed this with the patient today. She is in agreement with this plan. 07/29/2019 on evaluation today patient seems  to be making some progress albeit slow regard to her wounds. Fortunately there is no signs of active infection at this time. No fever chills noted. She overall seems to be doing quite well at this point. ABRISH, ERNY ([id number]) 08/12/2019 upon evaluation today patient appears to be doing about the same at this point in regard to her wounds. She still has some hyper granulation. I really feel like she would do better with Hydrofera Blue over the alginate itself. With that being said we have not been able to use that for a while as it was sticking but again it has been sometime she was having issues with a different bed at that time as well which may have contributed to some of the issues at that point. Fortunately there is no evidence of active infection at this time. No fevers, chills, nausea, vomiting, or diarrhea. 09/04/2019 upon evaluation today patient actually appears to be doing excellent in regard to her wounds across the board. I am extremely pleased with how things appear today. She has a lot of new epithelization, good granulation, and overall she states that she is doing very well with the current bed. Everything about today seems to be on the up and up. 09/25/2019 upon evaluation today patient appears to be doing excellent in regard to her wounds. She has been tolerating the dressing changes without complication. Fortunately there is no evidence of active infection at this time. No fevers, chills, nausea, vomiting, or diarrhea. Overall she is actually been making great progress since getting off of the air-fluidized mattress I am happy in that regard. 10/17/2019 upon evaluation today patient appears to be doing well at this point in regard to her wounds. She is making good progress at this time which is good news. Fortunately there is no signs of active infection at this time. No fevers, chills, nausea, vomiting, or diarrhea. 10/31/2019 upon evaluation today patient actually appears to be  doing excellent in regard to her wounds currently. I feel like the [facility] is doing a great job. Overall very pleased with the progress she is making. 11/21/2019; patient has wounds on her lower sacrum and right greater trochanter posteriorly. The wounds are epithelializing. No debridement was required. She has a level 3 surface for offloading. She tells [place] that she spends mostly all day in bed. She is a cervical level quadriplegia. 12/09/2019 upon evaluation today patient appears to be doing excellent in regard to her wounds. She has been tolerating the dressing changes without complication. Fortunately there does not appear to be any signs of active infection which is great news and overall  I am extremely pleased. I think her wounds are getting closer to complete resolution. 12/26/2019 upon evaluation today patient appears to be doing okay regard to her wounds currently. Fortunately there is no signs of active infection at this time. No fever chills noted. She has been taking care of of the wounds decently well but unfortunately is having some issues currently with staying up a little bit too much I think this has led to the wounds not being quite as good as what we were hoping for but nonetheless I think that she is still doing fairly well. 01/09/2020 upon evaluation today patient appears to be doing a little bit worse today compared to normal. Fortunately there is been no signs of active infection at this time which is good news. Fortunately there is no fevers, chills, nausea, vomiting, or diarrhea. With that being said her wounds do appear to be doing worse as far as the overall size is concerned that does have the somewhat concerned here as to be honest I think this is likely a direct result of too much pressure getting to the wounds at this time. I did have a note from the home health nurse asking about switching to a different dressing. That being said she is done so well with the [facility] I am somewhat reluctant to do so we have tried other dressings in the past without much good with success. With that being said I do believe that the patient is potentially in need of more appropriate and sustained offloading. 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 For about 4-5 months she's had a decubitus ulcer on the right scapular region. she was lost to follow-up for the last 6 months. she had a couple of small areas which have opened out in the region of previous scar tissue in the sacral region but these haveall healed now. She does not have any other significant comorbidities. She has a good Roho cushion for her wheelchair and she has a specialized mattress for sleep. She has restarted smoking and smokes about half a pack of cigarettes a day 02/18/2015 -- X-ray of the pelvis -- IMPRESSION:No acute osseous injury of the pelvis. If there is clinical concern regarding osteomyelitis, recommend further evaluation with CT. X-ray of the sacrum and coccyx IMPRESSION:No acute osseous abnormality of the sacrum. If there is further clinical concern regarding osteomyelitis of the sacrum, further evaluation with CT is recommended. ========= Electronic Signature(s) Signed: 01/09/2020 2:47:53 PM By: Lenda Kelp PA-C Entered By: Lenda Kelp on 01/09/2020 14:47:52 Alvarenga, [name] ([id number]) -------------------------------------------------------------------------------- Physical Exam Details Patient Name: GRAINNE, KNIGHTS. Date of Service: 01/09/2020 2:15 PM Medical Record Number: [id number] Patient Account Number: [id number] Date of Birth/Sex: 1965/12/16 (54 y.o. F) Treating RN: Huel Coventry Primary Care Provider: [place], JULIE Other Clinician: Referring Provider: [place], JULIE Treating Provider/Extender: STONE III, Karlena Luebke Weeks in Treatment:  16 Constitutional Well-nourished and well-hydrated in no acute distress. Respiratory normal breathing without difficulty. Psychiatric this patient is able to make decisions and demonstrates good insight into disease process. Alert and Oriented x 3. pleasant and cooperative. Notes Upon inspection patient's wounds do appear to be somewhat broken down that she has good granulation noted at this time fortunately there is no evidence of active infection at this time. Electronic Signature(s) Signed: 01/09/2020 2:48:07 PM By: Lenda Kelp PA-C Entered By: Lenda Kelp on 01/09/2020  14:48:06 [name], [name] ([id number]) -------------------------------------------------------------------------------- Physician Orders Details Patient Name: [name], [name]. Date of Service: 01/09/2020 2:15 PM Medical Record Number: [id number] Patient Account Number: [id number] Date of Birth/Sex: 27-Jun-1966 (54 y.o. F) Treating RN: Huel Coventry Primary Care Provider: [place], JULIE Other Clinician: Referring Provider: [place], JULIE Treating Provider/Extender: Linwood Dibbles, Elizebath Wever Weeks in Treatment: 43 Verbal / Phone Orders: No Diagnosis Coding ICD-10 Coding Code Description L89.313 Pressure ulcer of right buttock, stage 3 L89.323 Pressure ulcer of left buttock, stage 3 L89.213 Pressure ulcer of right hip, stage 3 G82.52 Quadriplegia, C1-C4 incomplete E44.0 Moderate protein-calorie malnutrition Wound Cleansing Wound #5 Right Sacrum o Clean wound with Normal Saline. o May Shower, gently pat wound dry prior to applying new dressing. Wound #7 Right Trochanter o Clean wound with Normal Saline. o May Shower, gently pat wound dry prior to applying new dressing. Primary Wound Dressing Wound #5 Right Sacrum o Hydrafera Blue Ready Transfer - Please order Hydrofera Blue Ready Transfer specifically for patient r/t wound location and wet with saline or warm soapy water prior to removal Wound #7 Right  Trochanter o Hydrafera Blue Ready Transfer - Please order Hydrofera Blue Ready Transfer specifically for patient r/t wound location and wet with saline or warm soapy water prior to removal Secondary Dressing Wound #5 Right Sacrum o ABD pad - Secure with tape or bordered foam dressing Wound #7 Right Trochanter o ABD pad - Secure with tape or bordered foam dressing Dressing Change Frequency Wound #5 Right Sacrum o Change Dressing Monday, Wednesday, Friday - or three times weekly Wound #7 Right Trochanter o Change Dressing Monday, Wednesday, Friday - or three times weekly Follow-up Appointments Wound #5 Right Sacrum o Return Appointment in 2 weeks. Wound #7 Right Trochanter o Return Appointment in 2 weeks. Off-Loading Wound #5 Right Sacrum o Mattress - air mattress or fluidized air mattress o Turn and reposition every 2 hours Wound #7 Right Trochanter Youkhana, Riham M. ([id number]) o Mattress - air mattress or fluidized air mattress o Turn and reposition every 2 hours Additional Orders / Instructions Wound #5 Right Sacrum o Increase protein intake. o Increase protein intake. Wound #7 Right Trochanter o Increase protein intake. o Increase protein intake. Home Health Wound #5 Right Sacrum o Continue Home Health Visits - [facility] - Please provide dressing materials for patient o Home Health Nurse may visit PRN to address patientos wound care needs. o FACE TO FACE ENCOUNTER: MEDICARE and MEDICAID PATIENTS: I certify that this patient is under my care and that I had a face-to-face encounter that meets the physician face-to-face encounter requirements with this patient on this date. The encounter with the patient was in whole or in part for the following MEDICAL CONDITION: (primary reason for Home Healthcare) MEDICAL NECESSITY: I certify, that based on my findings, NURSING services are a medically necessary home health service. HOME BOUND STATUS: I  certify that my clinical findings support that this patient is homebound (i.e., Due to illness or injury, pt requires aid of supportive devices such as crutches, cane, wheelchairs, walkers, the use of special transportation or the assistance of another person to leave their place of residence. There is a normal inability to leave the home and doing so requires considerable and taxing effort. Other absences are for medical reasons / religious services and are infrequent or of short duration when for other reasons). o If current dressing causes regression in wound condition, may D/C ordered dressing product/s and apply Normal Saline Moist Dressing daily until next Wound  Healing Center / Other MD appointment. Notify Wound Healing Center of regression in wound condition at [phone]. o Please direct any NON-WOUND related issues/requests for orders to patient's Primary Care Physician Wound #7 Right Trochanter o Continue Home Health Visits - [facility] - Please provide dressing materials for patient o Home Health Nurse may visit PRN to address patientos wound care needs. o FACE TO FACE ENCOUNTER: MEDICARE and MEDICAID PATIENTS: I certify that this patient is under my care and that I had a face-to-face encounter that meets the physician face-to-face encounter requirements with this patient on this date. The encounter with the patient was in whole or in part for the following MEDICAL CONDITION: (primary reason for Home Healthcare) MEDICAL NECESSITY: I certify, that based on my findings, NURSING services are a medically necessary home health service. HOME BOUND STATUS: I certify that my clinical findings support that this patient is homebound (i.e., Due to illness or injury, pt requires aid of supportive devices such as crutches, cane, wheelchairs, walkers, the use of special transportation or the assistance of another person to leave their place of residence. There is a normal inability to leave  the home and doing so requires considerable and taxing effort. Other absences are for medical reasons / religious services and are infrequent or of short duration when for other reasons). o If current dressing causes regression in wound condition, may D/C ordered dressing product/s and apply Normal Saline Moist Dressing daily until next Wound Healing Center / Other MD appointment. Notify Wound Healing Center of regression in wound condition at [phone]. o Please direct any NON-WOUND related issues/requests for orders to patient's Primary Care Physician Patient Medications Allergies: Shellfish Containing Products Notifications Medication Indication Start End Santyl 01/23/2020 DOSE topical 250 unit/gram ointment - ointment topical Apply nickel thick daily to the wound bed and then cover with a dressing as directed in clinic Electronic Signature(s) Signed: 01/23/2020 2:20:40 PM By: Lenda Kelp PA-C Previous Signature: 01/09/2020 3:59:28 PM Version By: Lenda Kelp PA-C Previous Signature: 01/14/2020 6:25:04 PM Version By: Elliot Gurney, BSN, RN, CWS, Kim RN, BSN Entered By: Lenda Kelp on 01/23/2020 14:20:38 Sarris, [name] ([id number]) -------------------------------------------------------------------------------- Problem List Details Patient Name: [name], [name]. Date of Service: 01/09/2020 2:15 PM Medical Record Number: [id number] Patient Account Number: [id number] Date of Birth/Sex: 06-20-1966 (54 y.o. F) Treating RN: Huel Coventry Primary Care Provider: [place], JULIE Other Clinician: Referring Provider: [place], JULIE Treating Provider/Extender: Linwood Dibbles, Mareena Cavan Weeks in Treatment: 22 Active Problems ICD-10 Encounter Code Description Active Date MDM Diagnosis L89.313 Pressure ulcer of right buttock, stage 3 04/08/2019 No Yes L89.323 Pressure ulcer of left buttock, stage 3 04/08/2019 No Yes L89.213 Pressure ulcer of right hip, stage 3 04/08/2019 No Yes G82.52 Quadriplegia,  C1-C4 incomplete 04/08/2019 No Yes E44.0 Moderate protein-calorie malnutrition 04/08/2019 No Yes Inactive Problems Resolved Problems Electronic Signature(s) Signed: 01/09/2020 2:32:40 PM By: Lenda Kelp PA-C Entered By: Lenda Kelp on 01/09/2020 14:32:39 Dini, [name] ([id number]) -------------------------------------------------------------------------------- Progress Note Details Patient Name: [name] Date of Service: 01/09/2020 2:15 PM Medical Record Number: [id number] Patient Account Number: [id number] Date of Birth/Sex: [birth year] (54 y.o. F) Treating RN: Huel Coventry Primary Care Provider: [place], JULIE Other Clinician: Referring Provider: [place], JULIE Treating Provider/Extender: Linwood Dibbles, Tobie Perdue Weeks in Treatment: 39 Subjective Chief Complaint Information obtained from Patient Bilateral gluteal and right hip pressure ulcers History of Present Illness (HPI) 54 year old patient who was been an unreliable historian says she had completely healed in February and most recently her  home health noticed that the wound on the right scapular area had reopened. She was in hospital about a month ago with pneumonia at that time a workup was done. We have no notes today but will workup the electronic medical record. on review of her records electronically I do not find any recent hospital MRI in the last 6 months. She had a CT scan of the chest in February 2018 which does not show any osseous lesions. she was admitted to the hospital between May 14 and May 17 for 3 days for mucus plugging and collapse of the left lung. at that time she had a decubitus ulcer of the right scapular region stage III. her history was noted to have quadriplegia C5-C7 incomplete, neurogenic bladder, nephrostomy catheter and emphysema. patient tells me that she has not smoked for last month and a half but as per her hospital records she was smoking in the middle of May. 01/12/17 patient scapular region  appears to be doing fairly well on evaluation today. She is having really no syndicate discomfort and has been tolerating the dressing changes well complication. Readmission: 04/08/2019 patient presents today for reevaluation in our clinic concerning issues she is having with wounds on the bilateral gluteal area as well as on the right trochanter. She has unfortunately noted that things seem to have gotten worse as she was using the Clinitron bed. The patient states it gives off a lot of heat and subsequently has not been good for her skin causing her to breakdown more. She has been trying to get back just to the air mattress but has not been able to get the company to come pick up that bed and bring out the air mattress. Her primary care provider is on this as well trying to get them to do so. Unfortunately her wounds are significantly large and show signs of hyper granulation as well. She has had this kind of issue for quite some time and unfortunately just does not seem to be showing signs of significant improvement with what she has been using currently. No fevers, chills, nausea, vomiting, or diarrhea. She does attempt appropriate offloading and again has been using the bed because that is all she has although she is not happy with it. 04/22/2019 on evaluation today patient appears to be doing about the same with regard to her wounds at this time. Fortunately there does not appear to be any signs of active infection at this point which is good news. She has been tolerating the dressing changes without complication. Overall I am pleased with that with that being said unfortunately she does have a little bit of more significant breakdown with regard to the wound bed upon evaluation today. I believe this is secondary to the fact that she has been lying potentially too much in a single position for too long over the past several weeks. I think that potentially if she were to mitigate this to a degree  that things may improve somewhat. With that being said she is really not happy with her current bed she is attempted to have them come out and switch this back to an air mattress but unfortunately has not been successful in getting them to come out and make this change. She is very frustrated with that. She still has the Clinitron bed and she much prefers just having the standard air mattress. Patient's primary care provider still working on this she tells me. 05/08/2019 on evaluation today patient unfortunately is not doing  as well as I would like to see in regard to her wounds. She actually is measuring a little bit larger pretty much at all wound locations unfortunately and overall I am very upset as well with the fact that she has been trying to get the Clinitron bed removed from her home but is not having any luck as such. We did get the number for the company today in order for [place] to see about calling them and switching this over to an air mattress as this is giving her a lot of trouble she states is really hot and she feels like it is breaking down her skin. This is not good. 05/22/2019 on evaluation today patient appears to be doing well with regard to the wounds she is showing some signs of slight improvement at this point. This is good news. Fortunately there is no evidence of active infection at this time which is also good news. No fevers, chills, nausea, vomiting, or diarrhea. She does tell me that in order to get rid of the Clinitron bed that she has been working on that the representative from the company stated they needed in order for me asked to be getting rid of it and then what we wanted her to have. Again I will be more than happy to do that we can initiate an air mattress for her. 06/06/2019 on evaluation today patient continues to have issues with her wounds. The good news is we figured out what to be done for the her bad as far as get not moved however she is getting actually  ready to move and change residences. For that reason what they are going to do is come in and take the bed from the current residence to get rid of the Clinitron bed and then subsequently move the new bed into her new place of residence. This makes complete sense to me as well and I think that sounds like a good plan. At least we have a plan for what to do as far as getting things taken care of here. 06/24/2019 on evaluation today patient appears to be doing really about the same in some regards although a couple of the wounds are measuring a little bit larger and deeper compared to previous. With that being said I do think that unfortunately her wounds seem to be overall progressing in the wrong direction towards being worse not better. The patient is still having to use the Clinitron bed at this time I do think that she really needs to switch to just a standard air mattress that is what she wants but she is having to wait until she moves in 2 weeks before she can get the new mattress so she does not have to pay to have the bed moved twice. 07/18/2019 on evaluation today patient appears to actually be doing somewhat better in regard to her wounds in general. I am very pleased in this regard. With that being said she still has some hyper granulation unfortunately the Hydrofera Blue just was getting stuck to her and the silver cell has done better. Nonetheless I do believe that we do need to probably treat this with some silver nitrate to try to help with the hypergranulation I discussed this with the patient today. She is in agreement with this plan. [name], [name] ([id number]) 07/29/2019 on evaluation today patient seems to be making some progress albeit slow regard to her wounds. Fortunately there is no signs of active infection at this time.  No fever chills noted. She overall seems to be doing quite well at this point. 08/12/2019 upon evaluation today patient appears to be doing about the same at  this point in regard to her wounds. She still has some hyper granulation. I really feel like she would do better with Hydrofera Blue over the alginate itself. With that being said we have not been able to use that for a while as it was sticking but again it has been sometime she was having issues with a different bed at that time as well which may have contributed to some of the issues at that point. Fortunately there is no evidence of active infection at this time. No fevers, chills, nausea, vomiting, or diarrhea. 09/04/2019 upon evaluation today patient actually appears to be doing excellent in regard to her wounds across the board. I am extremely pleased with how things appear today. She has a lot of new epithelization, good granulation, and overall she states that she is doing very well with the current bed. Everything about today seems to be on the up and up. 09/25/2019 upon evaluation today patient appears to be doing excellent in regard to her wounds. She has been tolerating the dressing changes without complication. Fortunately there is no evidence of active infection at this time. No fevers, chills, nausea, vomiting, or diarrhea. Overall she is actually been making great progress since getting off of the air-fluidized mattress I am happy in that regard. 10/17/2019 upon evaluation today patient appears to be doing well at this point in regard to her wounds. She is making good progress at this time which is good news. Fortunately there is no signs of active infection at this time. No fevers, chills, nausea, vomiting, or diarrhea. 10/31/2019 upon evaluation today patient actually appears to be doing excellent in regard to her wounds currently. I feel like the [facility] is doing a great job. Overall very pleased with the progress she is making. 11/21/2019; patient has wounds on her lower sacrum and right greater trochanter posteriorly. The wounds are epithelializing. No debridement was required.  She has a level 3 surface for offloading. She tells [place] that she spends mostly all day in bed. She is a cervical level quadriplegia. 12/09/2019 upon evaluation today patient appears to be doing excellent in regard to her wounds. She has been tolerating the dressing changes without complication. Fortunately there does not appear to be any signs of active infection which is great news and overall I am extremely pleased. I think her wounds are getting closer to complete resolution. 12/26/2019 upon evaluation today patient appears to be doing okay regard to her wounds currently. Fortunately there is no signs of active infection at this time. No fever chills noted. She has been taking care of of the wounds decently well but unfortunately is having some issues currently with staying up a little bit too much I think this has led to the wounds not being quite as good as what we were hoping for but nonetheless I think that she is still doing fairly well. 01/09/2020 upon evaluation today patient appears to be doing a little bit worse today compared to normal. Fortunately there is been no signs of active infection at this time which is good news. Fortunately there is no fevers, chills, nausea, vomiting, or diarrhea. With that being said her wounds do appear to be doing worse as far as the overall size is concerned that does have the somewhat concerned here as to be honest I think  this is likely a direct result of too much pressure getting to the wounds at this time. I did have a note from the home health nurse asking about switching to a different dressing. That being said she is done so well with the [facility] I am somewhat reluctant to do so we have tried other dressings in the past without much good with success. With that being said I do believe that the patient is potentially in need of more appropriate and sustained offloading. 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 For about 4-5 months she's had a decubitus ulcer on the right scapular region. she was lost to follow-up for the last 6 months. she had a couple of small areas which have opened out in the region of previous scar tissue in the sacral region but these haveall healed now. She does not have any other significant comorbidities. She has a good Roho cushion for her wheelchair and she has a specialized mattress for sleep. She has restarted smoking and smokes about half a pack of cigarettes a day 02/18/2015 -- X-ray of the pelvis -- IMPRESSION:No acute osseous injury of the pelvis. If there is clinical concern regarding osteomyelitis, recommend further evaluation with CT. X-ray of the sacrum and coccyx IMPRESSION:No acute osseous abnormality of the sacrum. If there is further clinical concern regarding osteomyelitis of the sacrum, further evaluation with CT is recommended. ========= Objective Constitutional Well-nourished and well-hydrated in no acute distress. Vitals Time Taken: 2:17 PM, Height: 63 in, Weight: 70 lbs, BMI: 12.4, Temperature: 98.4 F, Pulse: 81 bpm, Respiratory Rate: 16 breaths/min, Blood Pressure: 108/68 mmHg. Respiratory normal breathing without difficulty. Psychiatric this patient is able to make decisions and demonstrates good insight into disease process. Alert and Oriented x 3. pleasant and cooperative. [name], [name]. ([id number]) General Notes: Upon inspection patient's wounds do appear to be somewhat broken down that she has good granulation noted at this time fortunately there is no evidence of active infection at this time. Integumentary (Hair, Skin) Wound #5 status is Open. Original cause of wound was Gradually Appeared. The wound is located on the Right Sacrum. The wound measures 6.5cm length x 11cm width x 0.1cm depth; 56.156cm^2 area and 5.616cm^3 volume. There is Fat  Layer (Subcutaneous Tissue) Exposed exposed. There is a medium amount of serous drainage noted. The wound margin is flat and intact. There is large (67-100%) pink, hyper - granulation within the wound bed. There is a small (1-33%) amount of necrotic tissue within the wound bed including Adherent Slough. Wound #7 status is Open. Original cause of wound was Gradually Appeared. The wound is located on the Right Trochanter. The wound measures 3cm length x 3.5cm width x 0.5cm depth; 8.247cm^2 area and 4.123cm^3 volume. There is Fat Layer (Subcutaneous Tissue) Exposed exposed. There is a medium amount of serous drainage noted. The wound margin is flat and intact. There is large (67-100%) red, hyper - granulation within the wound bed. There is a small (1-33%) amount of necrotic tissue within the wound bed including Adherent Slough. 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 May Shower, gently pat wound dry prior to applying new dressing. Wound #7 Right Trochanter: Clean  wound with Normal Saline. May Shower, gently pat wound dry prior to applying new dressing. Primary Wound Dressing: Wound #5 Right Sacrum: Hydrafera Blue Ready Transfer - Please order Hydrofera Blue Ready Transfer specifically for patient r/t wound location and wet with saline or warm soapy water prior to removal Wound #7 Right Trochanter: Hydrafera Blue Ready Transfer - Please order Hydrofera Blue Ready Transfer specifically for patient r/t wound location and wet with saline or warm soapy water prior to removal Secondary Dressing: Wound #5 Right Sacrum: ABD pad - Secure with tape or bordered foam dressing Wound #7 Right Trochanter: ABD pad - Secure with tape or bordered  foam dressing Dressing Change Frequency: Wound #5 Right Sacrum: Change Dressing Monday, Wednesday, Friday - or three times weekly Wound #7 Right Trochanter: Change Dressing Monday, Wednesday, Friday - or three times weekly Follow-up Appointments: Wound #5 Right Sacrum: Return Appointment in 2 weeks. Wound #7 Right Trochanter: Return Appointment in 2 weeks. Off-Loading: Wound #5 Right Sacrum: Mattress - air mattress or fluidized air mattress Turn and reposition every 2 hours Wound #7 Right Trochanter: Mattress - air mattress or fluidized air mattress Turn and reposition every 2 hours Additional Orders / Instructions: Wound #5 Right Sacrum: Increase protein intake. Increase protein intake. [name], [name]. ([id number]) Wound #7 Right Trochanter: Increase protein intake. Increase protein intake. Home Health: Wound #5 Right Sacrum: Continue Home Health Visits - [facility] - Please provide dressing materials for patient Home Health Nurse may visit PRN to address patient s wound care needs. FACE TO FACE ENCOUNTER: MEDICARE and MEDICAID PATIENTS: I certify that this patient is under my care and that I had a face-to-face encounter that meets the physician face-to-face encounter requirements with this patient on this date. The encounter with the patient was in whole or in part for the following MEDICAL CONDITION: (primary reason for Home Healthcare) MEDICAL NECESSITY: I certify, that based on my findings, NURSING services are a medically necessary home health service. HOME BOUND STATUS: I certify that my clinical findings support that this patient is homebound (i.e., Due to illness or injury, pt requires aid of supportive devices such as crutches, cane, wheelchairs, walkers, the use of special transportation or the assistance of another person to leave their place of residence. There is a normal inability to leave the home and doing so requires considerable and taxing effort. Other absences  are for medical reasons / religious services and are infrequent or of short duration when for other reasons). If current dressing causes regression in wound condition, may D/C ordered dressing product/s and apply Normal Saline Moist Dressing daily until next Wound Healing Center / Other MD appointment. Notify Wound Healing Center of regression in wound condition at [phone]. Please direct any NON-WOUND related issues/requests for orders to patient's Primary Care Physician Wound #7 Right Trochanter: Continue Home Health Visits - [facility] - Please provide dressing materials for patient Home Health Nurse may visit PRN to address patient s wound care needs. FACE TO FACE ENCOUNTER: MEDICARE and MEDICAID PATIENTS: I certify that this patient is under my care and that I had a face-to-face encounter that meets the physician face-to-face encounter requirements with this patient on this date. The encounter with the patient was in whole or in part for the following MEDICAL CONDITION: (primary reason for Home Healthcare) MEDICAL NECESSITY: I certify, that based on my findings, NURSING services are a medically necessary home health service. HOME BOUND STATUS: I certify that my clinical findings support that this patient is homebound (i.e.,  Due to illness or injury, pt requires aid of supportive devices such as crutches, cane, wheelchairs, walkers, the use of special transportation or the assistance of another person to leave their place of residence. There is a normal inability to leave the home and doing so requires considerable and taxing effort. Other absences are for medical reasons / religious services and are infrequent or of short duration when for other reasons). If current dressing causes regression in wound condition, may D/C ordered dressing product/s and apply Normal Saline Moist Dressing daily until next Wound Healing Center / Other MD appointment. Notify Wound Healing Center of regression in  wound condition at [phone]. Please direct any NON-WOUND related issues/requests for orders to patient's Primary Care Physician The following medication(s) was prescribed: Santyl topical 250 unit/gram ointment ointment topical Apply nickel thick daily to the wound bed and then cover with a dressing as directed in clinic starting 01/23/2020 1. I would recommend that we continue with the [facility] Incydrofera Blue I still think that is best. I do not see any signs of active infection at this time if anything changes in that regard then will adjust as necessary. 2. I am also can recommend that we go ahead and continue with an ABD pad and tape to secure border foam dressings are okay as well if that is what they prefer with home health. 3. With regard to offloading the patient needs to aggressively offload to keep pressure off these areas I think that is the reason this is broken down not likely secondary to infection. We will see patient back for reevaluation in 2 weeks here in the clinic. If anything worsens or changes patient will contact our office for additional recommendations. Electronic Signature(s) Signed: 01/23/2020 2:20:51 PM By: Lenda KelpStone III, Katlin Bortner PA-C Previous Signature: 01/09/2020 2:48:52 PM Version By: Lenda KelpStone III, Samiyah Stupka PA-C Entered By: Lenda KelpStone III, Rondrick Barreira on 01/23/2020 14:20:51 [name], [name] -------------------------------------------------------------------------------- SuperBill Details Patient Name: [name] MastersWADE, [name] Date of Service: 01/09/2020 Medical Record Number: [id number] Patient Account Number: [id number] Date of Birth/Sex: Jul 14, 1965 (54 y.o. F) Treating RN: Huel CoventryWoody, Kim Primary Care Provider: [place], JULIE Other Clinician: Referring Provider: [place], JULIE Treating Provider/Extender: Linwood DibblesSTONE III, Aya Geisel Weeks in Treatment: 39 Diagnosis Coding ICD-10 Codes Code Description L89.313 Pressure ulcer of right buttock, stage 3 L89.323 Pressure ulcer of left buttock, stage  3 L89.213 Pressure ulcer of right hip, stage 3 G82.52 Quadriplegia, C1-C4 incomplete E44.0 Moderate protein-calorie malnutrition Facility Procedures CPT4 Code: [id number] Description: 99213 - WOUND CARE VISIT-LEV 3 EST PT Modifier: Quantity: 1 Physician Procedures CPT4 Code: [id number]: [id number] Description: 99213 - WC PHYS LEVEL 3 - EST PT Modifier: Quantity: 1 CPT4 Code: Description: ICD-10 Diagnosis Description L89.313 Pressure ulcer of right buttock, stage 3 L89.323 Pressure ulcer of left buttock, stage 3 L89.213 Pressure ulcer of right hip, stage 3 G82.52 Quadriplegia, C1-C4 incomplete Modifier: Quantity: Electronic Signature(s) Signed: 01/09/2020 4:28:01 PM By: Elliot GurneyWoody, BSN, RN, CWS, Kim RN, BSN Signed: 01/19/2020 4:14:28 PM By: Lenda KelpStone III, Abigail Teall PA-C Previous Signature: 01/09/2020 2:49:59 PM Version By: Lenda KelpStone III, Keijuan Schellhase PA-C Entered By: Elliot GurneyWoody, BSN, RN, CWS, Kim on 01/09/2020 16:28:00

## 2020-01-15 NOTE — Progress Notes (Signed)
[name], [name] ([id number]) Visit Report for 01/09/2020 Arrival Information Details Patient Name: [name], [name] Date of Service: 01/09/2020 2:15 PM Medical Record Number: [id number] Patient Account Number: [id number] Date of Birth/Sex: Sep 20, 1965 (53 y.o. F) Treating RN: Rodell Perna Primary Care Tuck Dulworth: [place], [place]ULIE Other Clinician: Referring Jaquae Rieves: [place], [place] Treating Mattisen Pohlmann/Extender: Linwood Dibbles, HOYT Weeks in Treatment: 39 Visit Information History Since Last Visit Added or deleted any medications: No Patient Arrived: Wheel Chair Any new allergies or adverse reactions: No Arrival Time: 14:17 Had a fall or experienced change in No Accompanied By: husband activities of daily living that may affect Transfer Assistance: None risk of falls: Patient Identification Verified: Yes Signs or symptoms of abuse/neglect since last visito No Patient Requires Transmission-Based Precautions: No Hospitalized since last visit: No Patient Has Alerts: No Has Dressing in Place as Prescribed: Yes Pain Present Now: No Electronic Signature(s) Signed: 01/14/2020 4:08:07 PM By: Rodell Perna Entered By: Rodell Perna on 01/09/2020 14:17:36 Vrba, [name] ([id number]) -------------------------------------------------------------------------------- Clinic Level of Care Assessment Details Patient Name: [name] Date of Service: 01/09/2020 2:15 PM Medical Record Number: [id number] Patient Account Number: [id number] Date of Birth/Sex: [birth year] (54 y.o. F) Treating RN: Huel Coventry Primary Care Joesiah Lonon: [place], [place]ULIE Other Clinician: Referring Mariela Rex: [place], [place] Treating Conway Fedora/Extender: Linwood Dibbles, HOYT Weeks in Treatment: 39 Clinic Level of Care Assessment Items TOOL 4 Quantity Score []  - Use when only an EandM is performed on FOLLOW-UP visit 0 ASSESSMENTS - Nursing Assessment / Reassessment X - Reassessment of Co-morbidities (includes updates in patient status) 1 10 X-  1 5 Reassessment of Adherence to Treatment Plan ASSESSMENTS - Wound and Skin Assessment / Reassessment []  - Simple Wound Assessment / Reassessment - one wound 0 X- 1 5 Complex Wound Assessment / Reassessment - multiple wounds []  - 0 Dermatologic / Skin Assessment (not related to wound area) ASSESSMENTS - Focused Assessment []  - Circumferential Edema Measurements - multi extremities 0 []  - 0 Nutritional Assessment / Counseling / Intervention []  - 0 Lower Extremity Assessment (monofilament, tuning fork, pulses) []  - 0 Peripheral Arterial Disease Assessment (using hand held doppler) ASSESSMENTS - Ostomy and/or Continence Assessment and Care []  - Incontinence Assessment and Management 0 []  - 0 Ostomy Care Assessment and Management (repouching, etc.) PROCESS - Coordination of Care X - Simple Patient / Family Education for ongoing care 1 15 []  - 0 Complex (extensive) Patient / Family Education for ongoing care []  - 0 Staff obtains , Records, Test Results / Process Orders []  - 0 Staff telephones HHA, Nursing Homes / Clarify orders / etc []  - 0 Routine Transfer to another Facility (non-emergent condition) []  - 0 Routine Hospital Admission (non-emergent condition) []  - 0 New Admissions / / Ordering NPWT, Apligraf, etc. []  - 0 Emergency Hospital Admission (emergent condition) X- 1 10 Simple Discharge Coordination []  - 0 Complex (extensive) Discharge Coordination PROCESS - Special Needs []  - Pediatric / Minor Patient Management 0 []  - 0 Isolation Patient Management []  - 0 Hearing / Language / Visual special needs []  - 0 Assessment of Community assistance (transportation, D/C planning, etc.) []  - 0 Additional assistance / Altered mentation []  - 0 Support Surface(s) Assessment (bed, cushion, seat, etc.) INTERVENTIONS - Wound Cleansing / Measurement Tow, Ralyn M. ( ) []  - 0 Simple Wound Cleansing - one wound X- 2 5 Complex Wound  Cleansing - multiple wounds []  - 0 Wound Imaging (photographs - any number of wounds) X- 1 5 Wound Tracing (instead of photographs) []  -  0 Simple Wound Measurement - one wound X- 2 5 Complex Wound Measurement - multiple wounds INTERVENTIONS - Wound Dressings []  - Small Wound Dressing one or multiple wounds 0 X- 1 15 Medium Wound Dressing one or multiple wounds X- 1 20 Large Wound Dressing one or multiple wounds []  - 0 Application of Medications - topical []  - 0 Application of Medications - injection INTERVENTIONS - Miscellaneous []  - External ear exam 0 []  - 0 Specimen Collection (cultures, biopsies, blood, body fluids, etc.) []  - 0 Specimen(s) / Culture(s) sent or taken to Lab for analysis []  - 0 Patient Transfer (multiple staff / / Similar devices) []  - 0 Simple Staple / Suture removal (25 or less) []  - 0 Complex Staple / Suture removal (26 or more) []  - 0 Hypo / Hyperglycemic Management (close monitor of Blood Glucose) []  - 0 Ankle / Brachial Index (ABI) - do not check if billed separately X- 1 5 Vital Signs Has the patient been seen at the hospital within the last three years: Yes Total Score: 110 Level Of Care: New/Established - Level 3 Electronic Signature(s) Signed: 01/14/2020 6:25:04 PM By: , BSN, RN, CWS, Kim RN, BSN Entered By: , BSN, RN, CWS, Kim on 01/09/2020 16:27:51 Ziller, ( ) -------------------------------------------------------------------------------- Encounter Discharge Information Details Patient Name: ILYA, [name]. Date of Service: 01/09/2020 2:15 PM Medical Record Number: Patient Account Number: Date of Birth/Sex: [birth year] (54 y.o. F) Treating RN: Elliot Gurney Primary Care Cyla Haluska: Elliot Gurney, JULIE Other Clinician: Referring Ismaeel Arvelo: 03/11/2020, JULIE Treating Quinette Hentges/Extender: [name], HOYT Weeks in Treatment: 79 Encounter Discharge Information Items Discharge Condition:  Stable Ambulatory Status: Ambulatory Discharge Destination: Home Transportation: Private Auto Accompanied By: self Schedule Follow-up Appointment: Yes Clinical Summary of Care: Electronic Signature(s) Signed: 01/09/2020 4:29:15 PM By: 03/11/2020, BSN, RN, CWS, Kim RN, BSN Entered By: [id number], BSN, RN, CWS, Kim on 01/09/2020 16:29:15 09/21/1965 (57) -------------------------------------------------------------------------------- Lower Extremity Assessment Details Patient Name: SHILYNN, HOCH. Date of Service: 01/09/2020 2:15 PM Medical Record Number: [place] Patient Account Number: Linwood Dibbles Date of Birth/Sex: Nov 18, 1965 (54 y.o. F) Treating RN: Elliot Gurney Primary Care Akshara Blumenthal: Elliot Gurney, JULIE Other Clinician: Referring Jarius Dieudonne: 03/11/2020, JULIE Treating Kenwood Rosiak/Extender: [name], HOYT Weeks in Treatment: 39 Electronic Signature(s) Signed: 01/14/2020 4:08:07 PM By: [name] Entered By: 03/11/2020 on 01/09/2020 14:23:11 Bitting, [id number] (09/21/1965) -------------------------------------------------------------------------------- Multi Wound Chart Details Patient Name: JARROD, BODKINS. Date of Service: 01/09/2020 2:15 PM Medical Record Number: [place] Patient Account Number: [place] Date of Birth/Sex: [birth year] (54 y.o. F) Treating RN: Rodell Perna Primary Care Albertha Beattie: Rodell Perna, JULIE Other Clinician: Referring Rakiyah Esch: 03/11/2020, JULIE Treating [place] Pettengill/Extender: STONE III, HOYT Weeks in Treatment: 39 Vital Signs Height(in): 63 Pulse(bpm): 81 Weight(lbs): 70 Blood Pressure(mmHg): 108/68 Body Mass Index(BMI): 12 Temperature(F): 98.4 Respiratory Rate(breaths/min): 16 Photos: [N/A:N/A] Wound Location: Right Sacrum Right Trochanter N/A Wounding Event: Gradually Appeared Gradually Appeared N/A Primary Etiology: Pressure Ulcer Pressure Ulcer N/A Comorbid History: Asthma, Chronic Obstructive Asthma, Chronic Obstructive N/A Pulmonary Disease (COPD), History  Pulmonary Disease (COPD), History of pressure wounds, Paraplegia of pressure wounds, Paraplegia Date Acquired: 10/16/2018 04/02/2018 N/A Weeks of Treatment: 39 39 N/A Wound Status: Open Open N/A Clustered Wound: Yes No N/A Clustered Quantity: 5 N/A N/A Measurements L x W x D (cm) 6.5x11x0.1 3x3.5x0.5 N/A Area (cm) : 56.156 8.247 N/A Volume (cm) : 5.616 4.123 N/A % Reduction in Area: -19.20% 47.50% N/A % Reduction in Volume: 83.00% 70.80% N/A Classification: Category/Stage III Category/Stage III N/A Exudate Amount: Medium Medium  N/A Exudate Type: Serous Serous N/A Exudate Color: amber amber N/A Wound Margin: Flat and Intact Flat and Intact N/A Granulation Amount: Large (67-100%) Large (67-100%) N/A Granulation Quality: Pink, Hyper-granulation Red, Hyper-granulation N/A Necrotic Amount: Small (1-33%) Small (1-33%) N/A Exposed Structures: Fat Layer (Subcutaneous Tissue) Fat Layer (Subcutaneous Tissue) N/A Exposed: Yes Exposed: Yes Fascia: No Fascia: No Tendon: No Tendon: No Muscle: No Muscle: No Joint: No Joint: No Bone: No Bone: No Epithelialization: Large (67-100%) Small (1-33%) N/A Treatment Notes Electronic Signature(s) Signed: 01/14/2020 6:25:04 PM By: Elliot Gurney, BSN, RN, CWS, Kim RN, BSN Entered By: Elliot Gurney, BSN, RN, CWS, Kim on 01/09/2020 14:41:12 Devin, [name] ([id number]) Henricksen, [name] ([id number]) -------------------------------------------------------------------------------- Multi-Disciplinary Care Plan Details Patient Name: AUTYM, SIESS. Date of Service: 01/09/2020 2:15 PM Medical Record Number: [id number] Patient Account Number: [id number] Date of Birth/Sex: [birth year] (54 y.o. F) Treating RN: Huel Coventry Primary Care Averey Koning: [place], JULIE Other Clinician: Referring Ranika Mcniel: [place], [place] Treating Greer Wainright/Extender: Linwood Dibbles, HOYT Weeks in Treatment: 38 Active Inactive Abuse / Safety / Falls / Self Care Management Nursing Diagnoses: Impaired  physical mobility Goals: Patient will not develop complications from immobility Date Initiated: 04/08/2019 Target Resolution Date: 07/12/2019 Goal Status: Active Interventions: Assess fall risk on admission and as needed Notes: Nutrition Nursing Diagnoses: Potential for alteratiion in Nutrition/Potential for imbalanced nutrition Goals: Patient/caregiver agrees to and verbalizes understanding of need to use nutritional supplements and/or vitamins as prescribed Date Initiated: 04/08/2019 Target Resolution Date: 07/12/2019 Goal Status: Active Interventions: Assess patient nutrition upon admission and as needed per policy Notes: Pressure Nursing Diagnoses: Knowledge deficit related to management of pressures ulcers Goals: Patient will remain free of pressure ulcers Date Initiated: 04/08/2019 Target Resolution Date: 07/12/2019 Goal Status: Active Interventions: Assess potential for pressure ulcer upon admission and as needed Notes: Wound/Skin Impairment Nursing Diagnoses: Impaired tissue integrity Goals: Ulcer/skin breakdown will heal within 14 weeks Date Initiated: 04/08/2019 Target Resolution Date: 07/12/2019 Goal Status: Active MILANI, LOWENSTEIN ([id number]) Interventions: Assess patient/caregiver ability to obtain necessary supplies Assess patient/caregiver ability to perform ulcer/skin care regimen upon admission and as needed Assess ulceration(s) every visit Notes: Electronic Signature(s) Signed: 01/14/2020 6:25:04 PM By: Elliot Gurney, BSN, RN, CWS, Kim RN, BSN Entered By: Elliot Gurney, BSN, RN, CWS, Kim on 01/09/2020 14:41:03 Etchison, [name] ([id number]) -------------------------------------------------------------------------------- Pain Assessment Details Patient Name: DEZIREE, MOKRY. Date of Service: 01/09/2020 2:15 PM Medical Record Number: [id number] Patient Account Number: [id number] Date of Birth/Sex: 1965-10-18 (54 y.o. F) Treating RN: Rodell Perna Primary Care Tynisa Vohs:  [place], [place]ULIE Other Clinician: Referring Niyanna Asch: [place], [place] Treating Yurem Viner/Extender: STONE III, HOYT Weeks in Treatment: 58 Active Problems Location of Pain Severity and Description of Pain Patient Has Paino No Site Locations Pain Management and Medication Current Pain Management: Electronic Signature(s) Signed: 01/14/2020 4:08:07 PM By: Rodell Perna Entered By: Rodell Perna on 01/09/2020 14:18:01 Lyons, Telia M. ([id number]) -------------------------------------------------------------------------------- Wound Assessment Details Patient Name: [name] Date of Service: 01/09/2020 2:15 PM Medical Record Number: [id number] Patient Account Number: [id number] Date of Birth/Sex: 1965-10-08 (54 y.o. F) Treating RN: Rodell Perna Primary Care Bridgitt Raggio: [place], [place]ULIE Other Clinician: Referring Zephaniah Lubrano: [place], [place] Treating Gaelle Adriance/Extender: STONE III, HOYT Weeks in Treatment: 39 Wound Status Wound Number: 5 Primary Pressure Ulcer Etiology: Wound Location: Right Sacrum Wound Open Wounding Event: Gradually Appeared Status: Date Acquired: 10/16/2018 Comorbid Asthma, Chronic Obstructive Pulmonary Disease (COPD), Weeks Of Treatment: 39 History: History of pressure wounds, Paraplegia Clustered Wound: Yes Photos Wound Measurements Length: (cm) 6.5 Width: (cm) 11 Depth: (cm) 0.1 Clustered Quantity: 5  Area: (cm) 56.15 Volume: (cm) 5.616 % Reduction in Area: -19.2% % Reduction in Volume: 83% Epithelialization: Large (67-100%) 6 Wound Description Classification: Category/Stage III Foul O Wound Margin: Flat and Intact Slough Exudate Amount: Medium Exudate Type: Serous Exudate Color: amber dor After Cleansing: No /Fibrino Yes Wound Bed Granulation Amount: Large (67-100%) Exposed Structure Granulation Quality: Pink, Hyper-granulation Fascia Exposed: No Necrotic Amount: Small (1-33%) Fat Layer (Subcutaneous Tissue) Exposed: Yes Necrotic Quality: Adherent  Slough Tendon Exposed: No Muscle Exposed: No Joint Exposed: No Bone Exposed: No Treatment Notes Wound #5 (Right Sacrum) Notes hydrofera blue, abd and tape [name] MastersWADE, Aika M. ([id number]) Electronic Signature(s) Signed: 01/14/2020 4:08:07 PM By: Rodell PernaScott, Dajea Entered By: Rodell PernaScott, Dajea on 01/09/2020 14:22:41 Kesling, Journiee M. ([id number]) -------------------------------------------------------------------------------- Wound Assessment Details Patient Name: [name] MastersWADE, Adyline M. Date of Service: 01/09/2020 2:15 PM Medical Record Number: [id number] Patient Account Number: [id number] Date of Birth/Sex: Jan 31, 1966 (54 y.o. F) Treating RN: Rodell PernaScott, Dajea Primary Care Cross Jorge: [place], [place]UL[place] Other Clinician: Referring Livianna Petraglia: [place], [place] Treating Sulma Ruffino/Extender: STONE III, HOYT Weeks in Treatment: 39 Wound Status Wound Number: 7 Primary Pressure Ulcer Etiology: Wound Location: Right Trochanter Wound Open Wounding Event: Gradually Appeared Status: Date Acquired: 04/02/2018 Comorbid Asthma, Chronic Obstructive Pulmonary Disease (COPD), Weeks Of Treatment: 39 History: History of pressure wounds, Paraplegia Clustered Wound: No Photos Wound Measurements Length: (cm) 3 % Reduc Width: (cm) 3.5 % Reduc Depth: (cm) 0.5 Epithel Area: (cm) 8.247 Volume: (cm) 4.123 tion in Area: 47.5% tion in Volume: 70.8% ialization: Small (1-33%) Wound Description Classification: Category/Stage III Foul Od Wound Margin: Flat and Intact Slough/ Exudate Amount: Medium Exudate Type: Serous Exudate Color: amber or After Cleansing: No Fibrino No Wound Bed Granulation Amount: Large (67-100%) Exposed Structure Granulation Quality: Red, Hyper-granulation Fascia Exposed: No Necrotic Amount: Small (1-33%) Fat Layer (Subcutaneous Tissue) Exposed: Yes Necrotic Quality: Adherent Slough Tendon Exposed: No Muscle Exposed: No Joint Exposed: No Bone Exposed: No Treatment Notes Wound #7 (Right  Trochanter) Notes hydrofera blue, abd and tape Electronic Signature(s) Signed: 01/14/2020 4:08:07 PM By: Maylon CosScott, Dajea Deluna, Gracielynn M. ([id number]) Entered By: Rodell PernaScott, Dajea on 01/09/2020 14:23:00 Mickley, [name] ([id number]) -------------------------------------------------------------------------------- Vitals Details Patient Name: [name] MastersWADE, Seema M. Date of Service: 01/09/2020 2:15 PM Medical Record Number: [id number] Patient Account Number: [id number] Date of Birth/Sex: Jan 31, 1966 (54 y.o. F) Treating RN: Rodell PernaScott, Dajea Primary Care Deane Wattenbarger: [place], JULIE Other Clinician: Referring Orlando Devereux: [place], [place]ULIE Treating Daiel Strohecker/Extender: STONE III, HOYT Weeks in Treatment: 39 Vital Signs Time Taken: 14:17 Temperature (F): 98.4 Height (in): 63 Pulse (bpm): 81 Weight (lbs): 70 Respiratory Rate (breaths/min): 16 Body Mass Index (BMI): 12.4 Blood Pressure (mmHg): 108/68 Reference Range: 80 - 120 mg / dl Electronic Signature(s) Signed: 01/14/2020 4:08:07 PM By: Rodell PernaScott, Dajea Entered By: Rodell PernaScott, Dajea on 01/09/2020 14:17:55

## 2020-01-23 ENCOUNTER — Ambulatory Visit: Payer: Medicare Other | Admitting: Physician Assistant

## 2020-02-02 ENCOUNTER — Encounter: Payer: Medicare Other | Attending: Physician Assistant | Admitting: Physician Assistant

## 2020-02-02 ENCOUNTER — Other Ambulatory Visit: Payer: Self-pay

## 2020-02-02 ENCOUNTER — Other Ambulatory Visit
Admission: RE | Admit: 2020-02-02 | Discharge: 2020-02-02 | Disposition: A | Discharge status: Home / Self Care | Payer: Medicare Other | Source: Ambulatory Visit | Attending: Physician Assistant | Admitting: Physician Assistant

## 2020-02-02 DIAGNOSIS — Z681 Body mass index (BMI) 19 or less, adult: Secondary | ICD-10-CM | POA: Diagnosis not present

## 2020-02-02 DIAGNOSIS — L89323 Pressure ulcer of left buttock, stage 3: Secondary | ICD-10-CM | POA: Diagnosis not present

## 2020-02-02 DIAGNOSIS — F17210 Nicotine dependence, cigarettes, uncomplicated: Secondary | ICD-10-CM | POA: Insufficient documentation

## 2020-02-02 DIAGNOSIS — N319 Neuromuscular dysfunction of bladder, unspecified: Secondary | ICD-10-CM | POA: Insufficient documentation

## 2020-02-02 DIAGNOSIS — G8252 Quadriplegia, C1-C4 incomplete: Secondary | ICD-10-CM | POA: Diagnosis not present

## 2020-02-02 DIAGNOSIS — E440 Moderate protein-calorie malnutrition: Secondary | ICD-10-CM | POA: Insufficient documentation

## 2020-02-02 DIAGNOSIS — L89313 Pressure ulcer of right buttock, stage 3: Secondary | ICD-10-CM | POA: Diagnosis not present

## 2020-02-02 DIAGNOSIS — L89213 Pressure ulcer of right hip, stage 3: Secondary | ICD-10-CM | POA: Diagnosis present

## 2020-02-02 DIAGNOSIS — G8254 Quadriplegia, C5-C7 incomplete: Secondary | ICD-10-CM | POA: Insufficient documentation

## 2020-02-02 DIAGNOSIS — J439 Emphysema, unspecified: Secondary | ICD-10-CM | POA: Insufficient documentation

## 2020-02-02 DIAGNOSIS — L03115 Cellulitis of right lower limb: Secondary | ICD-10-CM | POA: Diagnosis present

## 2020-02-02 NOTE — Progress Notes (Signed)
[name], [name] ([id number]) Visit Report for 02/02/2020 Arrival Information Details Patient Name: [name], [name] Date of Service: 02/02/2020 12:30 PM Medical Record Number: [id number] Patient Account Number: [id number] Date of Birth/Sex: 1966/05/06 (54 y.o. F) Treating RN: Huel Coventry Primary Care Mckaela Howley: [place], [place]ULIE Other Clinician: Referring Amylah Will: [place], [place] Treating Mindi Akerson/Extender: Linwood Dibbles, HOYT Weeks in Treatment: 42 Visit Information History Since Last Visit All ordered tests and consults were completed: No Patient Arrived: Wheel Chair Added or deleted any medications: No Arrival Time: 13:02 Any new allergies or adverse reactions: No Accompanied By: son Had a fall or experienced change in No Transfer Assistance: Manual activities of daily living that may affect Patient Identification Verified: Yes risk of falls: Secondary Verification Process Completed: Yes Signs or symptoms of abuse/neglect since last visito No Patient Requires Transmission-Based Precautions: No Hospitalized since last visit: No Patient Has Alerts: No Implantable device outside of the clinic excluding No cellular tissue based products placed in the center since last visit: Has Dressing in Place as Prescribed: Yes Pain Present Now: Yes Electronic Signature(s) Signed: 02/02/2020 4:10:26 PM By: Benna Dunks Entered By: Benna Dunks on 02/02/2020 13:03:52 Bautch, Gulianna Judie Petit ([id number]) -------------------------------------------------------------------------------- Clinic Level of Care Assessment Details Patient Name: [name] Date of Service: 02/02/2020 12:30 PM Medical Record Number: [id number] Patient Account Number: [id number] Date of Birth/Sex: 08/24/1965 (54 y.o. F) Treating RN: Rodell Perna Primary Care Bibi Economos: [place], [place]ULIE Other Clinician: Referring Meghen Akopyan: [place], [place] Treating Elias Bordner/Extender: Linwood Dibbles, HOYT Weeks in Treatment: 42 Clinic Level of Care  Assessment Items TOOL 4 Quantity Score  - Use when only an EandM is performed on FOLLOW-UP visit 0 ASSESSMENTS - Nursing Assessment / Reassessment X - Reassessment of Co-morbidities (includes updates in patient status) 1 10 X- 1 5 Reassessment of Adherence to Treatment Plan ASSESSMENTS - Wound and Skin Assessment / Reassessment  - Simple Wound Assessment / Reassessment - one wound 0 X- 2 5 Complex Wound Assessment / Reassessment - multiple wounds  - 0 Dermatologic / Skin Assessment (not related to wound area) ASSESSMENTS - Focused Assessment  - Circumferential Edema Measurements - multi extremities 0  - 0 Nutritional Assessment / Counseling / Intervention  - 0 Lower Extremity Assessment (monofilament, tuning fork, pulses)  - 0 Peripheral Arterial Disease Assessment (using hand held doppler) ASSESSMENTS - Ostomy and/or Continence Assessment and Care  - Incontinence Assessment and Management 0  - 0 Ostomy Care Assessment and Management (repouching, etc.) PROCESS - Coordination of Care X - Simple Patient / Family Education for ongoing care 1 15  - 0 Complex (extensive) Patient / Family Education for ongoing care  - 0 Staff obtains Chiropractor, Records, Test Results / Process Orders  - 0 Staff telephones HHA, Nursing Homes / Clarify orders / etc  - 0 Routine Transfer to another Facility (non-emergent condition)  - 0 Routine Hospital Admission (non-emergent condition)  - 0 New Admissions / Manufacturing engineer / Ordering NPWT, Apligraf, etc.  - 0 Emergency Hospital Admission (emergent condition) X- 1 10 Simple Discharge Coordination  - 0 Complex (extensive) Discharge Coordination PROCESS - Special Needs  - Pediatric / Minor Patient Management 0  - 0 Isolation Patient Management  - 0 Hearing / Language / Visual special needs  - 0 Assessment of Community assistance (transportation, D/C planning, etc.)  - 0 Additional  assistance / Altered mentation  - 0 Support Surface(s) Assessment (bed, cushion, seat, etc.) INTERVENTIONS - Wound Cleansing / Measurement [name], Zaryia M. ([id number])  - 0 Simple Wound Cleansing -  one wound X- 2 5 Complex Wound Cleansing - multiple wounds X- 1 5 Wound Imaging (photographs - any number of wounds) []  - 0 Wound Tracing (instead of photographs) []  - 0 Simple Wound Measurement - one wound X- 2 5 Complex Wound Measurement - multiple wounds INTERVENTIONS - Wound Dressings []  - Small Wound Dressing one or multiple wounds 0 X- 2 15 Medium Wound Dressing one or multiple wounds []  - 0 Large Wound Dressing one or multiple wounds []  - 0 Application of Medications - topical []  - 0 Application of Medications - injection INTERVENTIONS - Miscellaneous []  - External ear exam 0 []  - 0 Specimen Collection (cultures, biopsies, blood, body fluids, etc.) []  - 0 Specimen(s) / Culture(s) sent or taken to Lab for analysis []  - 0 Patient Transfer (multiple staff / / Similar devices) []  - 0 Simple Staple / Suture removal (25 or less) []  - 0 Complex Staple / Suture removal (26 or more) []  - 0 Hypo / Hyperglycemic Management (close monitor of Blood Glucose) []  - 0 Ankle / Brachial Index (ABI) - do not check if billed separately X- 1 5 Vital Signs Has the patient been seen at the hospital within the last three years: Yes Total Score: 110 Level Of Care: New/Established - Level 3 Electronic Signature(s) Signed: 02/02/2020 1:41:08 PM By: Entered By: on 02/02/2020 13:31:36 Feider, ( ) -------------------------------------------------------------------------------- Encounter Discharge Information Details Patient Name: [name], [name] Date of Service: 02/02/2020 12:30 PM Medical Record Number: Patient Account Number: Nurse, adult Date of Birth/Sex: Mar 03, 1966 (54 y.o. F) Treating RN: Primary Care  Euline Kimbler: , JULIE Other Clinician: Referring Yovani Cogburn: 04/03/2020, JULIE Treating Monica Codd/Extender: Rodell Perna, HOYT Weeks in Treatment: 56 Encounter Discharge Information Items Discharge Condition: Stable Ambulatory Status: Wheelchair Discharge Destination: Home Transportation: Other Accompanied By: family Schedule Follow-up Appointment: Yes Clinical Summary of Care: Electronic Signature(s) Signed: 02/02/2020 1:33:50 PM By: [name] Entered By: [id number] on 02/02/2020 13:33:49 Dorough, 04/03/2020 ([id number]) -------------------------------------------------------------------------------- Lower Extremity Assessment Details Patient Name: [name], [name] Date of Service: 02/02/2020 12:30 PM Medical Record Number: [id number] Patient Account Number: Rodell Perna Date of Birth/Sex: 09-Oct-1965 (54 y.o. F) Treating RN: Linwood Dibbles Primary Care Muath Hallam: 45, JULIE Other Clinician: Referring Conda Wannamaker: 04/03/2020, JULIE Treating Peytin Dechert/Extender: Rodell Perna, HOYT Weeks in Treatment: 42 Electronic Signature(s) Signed: 02/02/2020 4:10:26 PM By: 04/03/2020 Signed: 02/02/2020 5:21:36 PM By: [id number], BSN, RN, CWS, Kim RN, BSN Entered By: [name] on 02/02/2020 13:19:19 Robel, [id number] ([id number]) -------------------------------------------------------------------------------- Multi Wound Chart Details Patient Name: [name], [name] Date of Service: 02/02/2020 12:30 PM Medical Record Number: Huel Coventry Patient Account Number: [place] Date of Birth/Sex: January 20, 1966 (54 y.o. F) Treating RN: 04/03/2020 Primary Care Samarion Ehle: Benna Dunks, JULIE Other Clinician: Referring Dantre Yearwood: 04/03/2020, JULIE Treating Charleigh Correnti/Extender: STONE III, HOYT Weeks in Treatment: 42 Vital Signs Height(in): 63 Pulse(bpm): 78 Weight(lbs): 70 Blood Pressure(mmHg): 117/69 Body Mass Index(BMI): 12 Temperature(F): 98.7 Respiratory Rate(breaths/min): 16 Photos: [N/A:N/A] Wound Location: Right Sacrum  Right Trochanter N/A Wounding Event: Gradually Appeared Gradually Appeared N/A Primary Etiology: Pressure Ulcer Pressure Ulcer N/A Comorbid History: Asthma, Chronic Obstructive Asthma, Chronic Obstructive N/A Pulmonary Disease (COPD), History Pulmonary Disease (COPD), History of pressure wounds, Paraplegia of pressure wounds, Paraplegia Date Acquired: 10/16/2018 04/02/2018 N/A Weeks of Treatment: 42 42 N/A Wound Status: Open Open N/A Clustered Wound: Yes No N/A Clustered Quantity: 5 N/A N/A Measurements L x W x D (cm) 8.2x12x0.1 4.5x3.2x1 N/A Area (cm) : 77.283 11.31 N/A Volume (cm) :  7.728 11.31 N/A % Reduction in Area: -64.00% 28.00% N/A % Reduction in Volume: 76.60% 20.00% N/A Classification: Category/Stage III Category/Stage III N/A Exudate Amount: Medium Medium N/A Exudate Type: Serous Serous N/A Exudate Color: amber amber N/A Wound Margin: Flat and Intact Flat and Intact N/A Granulation Amount: Large (67-100%) Large (67-100%) N/A Granulation Quality: Pink, Hyper-granulation Red, Hyper-granulation N/A Necrotic Amount: Small (1-33%) Small (1-33%) N/A Exposed Structures: Fat Layer (Subcutaneous Tissue) Fat Layer (Subcutaneous Tissue) N/A Exposed: Yes Exposed: Yes Fascia: No Fascia: No Tendon: No Tendon: No Muscle: No Muscle: No Joint: No Joint: No Bone: No Bone: No Epithelialization: Large (67-100%) Small (1-33%) N/A Treatment Notes Electronic Signature(s) Signed: 02/02/2020 1:41:08 PM By: Rodell PernaScott, Dajea Entered By: Rodell PernaScott, Dajea on 02/02/2020 13:23:08 Fawley, [name] ([id number]) Gaynor, [name] ([id number]) -------------------------------------------------------------------------------- Multi-Disciplinary Care Plan Details Patient Name: [name] MastersWADE, Toriana M. Date of Service: 02/02/2020 12:30 PM Medical Record Number: [id number] Patient Account Number: [id number] Date of Birth/Sex: [birth year] (54 y.o. F) Treating RN: Rodell PernaScott, Dajea Primary Care Caralina Nop: [place], [place]UL[place]  Other Clinician: Referring Pualani Borah: [place], [place]ULIE Treating Mikinzie Maciejewski/Extender: Linwood DibblesSTONE III, HOYT Weeks in Treatment: 2842 Active Inactive Abuse / Safety / Falls / Self Care Management Nursing Diagnoses: Impaired physical mobility Goals: Patient will not develop complications from immobility Date Initiated: 04/08/2019 Target Resolution Date: 07/12/2019 Goal Status: Active Interventions: Assess fall risk on admission and as needed Notes: Nutrition Nursing Diagnoses: Potential for alteratiion in Nutrition/Potential for imbalanced nutrition Goals: Patient/caregiver agrees to and verbalizes understanding of need to use nutritional supplements and/or vitamins as prescribed Date Initiated: 04/08/2019 Target Resolution Date: 07/12/2019 Goal Status: Active Interventions: Assess patient nutrition upon admission and as needed per policy Notes: Pressure Nursing Diagnoses: Knowledge deficit related to management of pressures ulcers Goals: Patient will remain free of pressure ulcers Date Initiated: 04/08/2019 Target Resolution Date: 07/12/2019 Goal Status: Active Interventions: Assess potential for pressure ulcer upon admission and as needed Notes: Wound/Skin Impairment Nursing Diagnoses: Impaired tissue integrity Goals: Ulcer/skin breakdown will heal within 14 weeks Date Initiated: 04/08/2019 Target Resolution Date: 07/12/2019 Goal Status: Active [name] MastersWADE, Jandy M. ([id number]) Interventions: Assess patient/caregiver ability to obtain necessary supplies Assess patient/caregiver ability to perform ulcer/skin care regimen upon admission and as needed Assess ulceration(s) every visit Notes: Electronic Signature(s) Signed: 02/02/2020 1:41:08 PM By: Rodell PernaScott, Dajea Entered By: Rodell PernaScott, Dajea on 02/02/2020 13:22:57 Dirocco, Tauriel M. ([id number]) -------------------------------------------------------------------------------- Pain Assessment Details Patient Name: [name] MastersWADE, Shawanda M. Date of Service:  02/02/2020 12:30 PM Medical Record Number: [id number] Patient Account Number: [id number] Date of Birth/Sex: [birth year] (54 y.o. F) Treating RN: Huel CoventryWoody, Kim Primary Care Jaxon Flatt: [place], [place]ULIE Other Clinician: Referring Syerra Abdelrahman: [place], [place]ULIE Treating Demaris Leavell/Extender: Linwood DibblesSTONE III, HOYT Weeks in Treatment: 42 Active Problems Location of Pain Severity and Description of Pain Patient Has Paino Yes Site Locations Pain Location: Pain in Ulcers With Dressing Change: No Duration of the Pain. Constant / Intermittento Constant Character of Pain Describe the Pain: Aching, Easy to Pinpoint, Tender Pain Management and Medication Current Pain Management: Medication: Yes Rest: Yes Goals for Pain Management staes pain is constant. Pain meds help. Off loading also helps. Electronic Signature(s) Signed: 02/02/2020 4:10:26 PM By: Benna DunksLineberry, Carol Signed: 02/02/2020 5:21:36 PM By: Elliot GurneyWoody, BSN, RN, CWS, Kim RN, BSN Entered By: Benna DunksLineberry, Carol on 02/02/2020 13:06:33 Mccormack, [name] ([id number]) -------------------------------------------------------------------------------- Patient/Caregiver Education Details Patient Name: [name] MastersWADE, Marvie M. Date of Service: 02/02/2020 12:30 PM Medical Record Number: [id number] Patient Account Number: [id number] Date of Birth/Gender: [birth year] (54 y.o. F) Treating RN: Rodell PernaScott, Dajea Primary Care Physician: [place], [place]ULIE Other Clinician: Referring  Physician: [place], [place]IE Treating Physician/Extender: Skeet Simmer in Treatment: 29 Education Assessment Education Provided To: Patient Education Topics Provided Wound/Skin Impairment: Handouts: Caring for Your Ulcer Methods: Demonstration, Explain/Verbal Responses: State content correctly Electronic Signature(s) Signed: 02/02/2020 1:41:08 PM By: Rodell Perna Entered By: Rodell Perna on 02/02/2020 13:32:42 Ton, Pluma M.  ([id number]) -------------------------------------------------------------------------------- Wound Assessment Details Patient Name: [name] Date of Service: 02/02/2020 12:30 PM Medical Record Number: [id number] Patient Account Number: [id number] Date of Birth/Sex: Aug 01, 1965 (55 y.o. F) Treating RN: Huel Coventry Primary Care Lewanda Perea: [place], JULIE Other Clinician: Referring Maciel Kegg: [place], [place] Treating Luceal Hollibaugh/Extender: STONE III, HOYT Weeks in Treatment: 42 Wound Status Wound Number: 5 Primary Pressure Ulcer Etiology: Wound Location: Right Sacrum Wound Open Wounding Event: Gradually Appeared Status: Date Acquired: 10/16/2018 Comorbid Asthma, Chronic Obstructive Pulmonary Disease (COPD), Weeks Of Treatment: 42 History: History of pressure wounds, Paraplegia Clustered Wound: Yes Photos Wound Measurements Length: (cm) 8.2 Width: (cm) 12 Depth: (cm) 0.1 Clustered Quantity: 5 Area: (cm) 77.28 Volume: (cm) 7.728 % Reduction in Area: -64% % Reduction in Volume: 76.6% Epithelialization: Large (67-100%) 3 Wound Description Classification: Category/Stage III Wound Margin: Flat and Intact Exudate Amount: Medium Exudate Type: Serous Exudate Color: amber Foul Odor After Cleansing: No Slough/Fibrino Yes Wound Bed Granulation Amount: Large (67-100%) Exposed Structure Granulation Quality: Pink, Hyper-granulation Fascia Exposed: No Necrotic Amount: Small (1-33%) Fat Layer (Subcutaneous Tissue) Exposed: Yes Necrotic Quality: Adherent Slough Tendon Exposed: No Muscle Exposed: No Joint Exposed: No Bone Exposed: No Treatment Notes Wound #5 (Right Sacrum) Notes santyl and scell to right trochanter, Scell to sacrum, ABD, tape NORAA, PICKERAL ([id number]) Electronic Signature(s) Signed: 02/02/2020 4:10:26 PM By: Benna Dunks Signed: 02/02/2020 5:21:36 PM By: Elliot Gurney, BSN, RN, CWS, Kim RN, BSN Entered By: Benna Dunks on 02/02/2020 13:18:50 Madrazo, [name]  ([id number]) -------------------------------------------------------------------------------- Wound Assessment Details Patient Name: [name], [name] Date of Service: 02/02/2020 12:30 PM Medical Record Number: [id number] Patient Account Number: [id number] Date of Birth/Sex: July 30, 1965 (54 y.o. F) Treating RN: Huel Coventry Primary Care Nickolus Wadding: [place], [place]ULIE Other Clinician: Referring Jameah Rouser: [place], [place] Treating Cathy Crounse/Extender: STONE III, HOYT Weeks in Treatment: 42 Wound Status Wound Number: 7 Primary Pressure Ulcer Etiology: Wound Location: Right Trochanter Wound Open Wounding Event: Gradually Appeared Status: Date Acquired: 04/02/2018 Comorbid Asthma, Chronic Obstructive Pulmonary Disease (COPD), Weeks Of Treatment: 42 History: History of pressure wounds, Paraplegia Clustered Wound: No Photos Wound Measurements Length: (cm) 4.5 Width: (cm) 3.2 Depth: (cm) 1 Area: (cm) 11.31 Volume: (cm) 11.31 % Reduction in Area: 28% % Reduction in Volume: 20% Epithelialization: Small (1-33%) Wound Description Classification: Category/Stage III Wound Margin: Flat and Intact Exudate Amount: Medium Exudate Type: Serous Exudate Color: amber Foul Odor After Cleansing: No Slough/Fibrino No Wound Bed Granulation Amount: Large (67-100%) Exposed Structure Granulation Quality: Red, Hyper-granulation Fascia Exposed: No Necrotic Amount: Small (1-33%) Fat Layer (Subcutaneous Tissue) Exposed: Yes Necrotic Quality: Adherent Slough Tendon Exposed: No Muscle Exposed: No Joint Exposed: No Bone Exposed: No Treatment Notes Wound #7 (Right Trochanter) Notes santyl and scell to right trochanter, Scell to sacrum, ABD, tape Electronic Signature(s) Signed: 02/02/2020 4:10:26 PM By: Havery Moros ([id number]) Signed: 02/02/2020 5:21:36 PM By: Elliot Gurney, BSN, RN, CWS, Kim RN, BSN Entered By: Benna Dunks on 02/02/2020 13:19:08 Wolpert, [name]  ([id number]) -------------------------------------------------------------------------------- Vitals Details Patient Name: [name], [name] Date of Service: 02/02/2020 12:30 PM Medical Record Number: [id number] Patient Account Number: [id number] Date of Birth/Sex: Apr 16, 1966 (54 y.o. F) Treating RN: Huel Coventry Primary Care Mike Hamre: [place], JULIE Other Clinician:  Referring Shafin Pollio: [place], [place] Treating Flem Enderle/Extender: STONE III, HOYT Weeks in Treatment: 42 Vital Signs Time Taken: 12:45 Temperature (F): 98.7 Height (in): 63 Pulse (bpm): 78 Weight (lbs): 70 Respiratory Rate (breaths/min): 16 Body Mass Index (BMI): 12.4 Blood Pressure (mmHg): 117/69 Reference Range: 80 - 120 mg / dl Electronic Signature(s) Signed: 02/02/2020 4:10:26 PM By: Benna Dunks Entered By: Benna Dunks on 02/02/2020 13:04:38

## 2020-02-02 NOTE — Progress Notes (Addendum)
CECIL, BIXBY ([id number]) Visit Report for 02/02/2020 Chief Complaint Document Details Patient Name: [name], [name] Date of Service: 02/02/2020 12:30 PM Medical Record Number: [id number] Patient Account Number: [id number] Date of Birth/Sex: 1966/03/09 (54 y.o. F) Treating RN: Huel Coventry Primary Care Provider: [place], JULIE Other Clinician: Referring Provider: [place], JULIE Treating Provider/Extender: Linwood Dibbles, Azayla Polo Weeks in Treatment: 42 Information Obtained from: Patient Chief Complaint Bilateral gluteal and right hip pressure ulcers Electronic Signature(s) Signed: 02/02/2020 1:20:54 PM By: Lenda Kelp PA-C Entered By: Lenda Kelp on 02/02/2020 13:20:54 Windish, [name] ([id number]) -------------------------------------------------------------------------------- HPI Details Patient Name: ARLEN, LEGENDRE. Date of Service: 02/02/2020 12:30 PM Medical Record Number: [id number] Patient Account Number: [id number] Date of Birth/Sex: 1966/03/27 (54 y.o. F) Treating RN: Huel Coventry Primary Care Provider: [place], JULIE Other Clinician: Referring Provider: [place], JULIE Treating Provider/Extender: Linwood Dibbles, Rynell Ciotti Weeks in Treatment: 42 History of Present Illness HPI Description: 54 year old patient who was been an unreliable historian says she had completely healed in February and most recently her home health noticed that the wound on the right scapular area had reopened. She was in hospital about a month ago with pneumonia at that time a workup was done. We have no notes today but will workup the electronic medical record. on review of her records electronically I do not find any recent hospital MRI in the last 6 months. She had a CT scan of the chest in February 2018 which does not show any osseous lesions. she was admitted to the hospital between May 14 and May 17 for 3 days for mucus plugging and collapse of the left lung. at that time she had a decubitus ulcer of the right  scapular region stage III. her history was noted to have quadriplegia C5-C7 incomplete, neurogenic bladder, nephrostomy catheter and emphysema. patient tells me that she has not smoked for last month and a half but as per her hospital records she was smoking in the middle of May. 01/12/17 patient scapular region appears to be doing fairly well on evaluation today. She is having really no syndicate discomfort and has been tolerating the dressing changes well complication. Readmission: 04/08/2019 patient presents today for reevaluation in our clinic concerning issues she is having with wounds on the bilateral gluteal area as well as on the right trochanter. She has unfortunately noted that things seem to have gotten worse as she was using the Clinitron bed. The patient states it gives off a lot of heat and subsequently has not been good for her skin causing her to breakdown more. She has been trying to get back just to the air mattress but has not been able to get the company to come pick up that bed and bring out the air mattress. Her primary care provider is on this as well trying to get them to do so. Unfortunately her wounds are significantly large and show signs of hyper granulation as well. She has had this kind of issue for quite some time and unfortunately just does not seem to be showing signs of significant improvement with what she has been using currently. No fevers, chills, nausea, vomiting, or diarrhea. She does attempt appropriate offloading and again has been using the bed because that is all she has although she is not happy with it. 04/22/2019 on evaluation today patient appears to be doing about the same with regard to her wounds at this time. Fortunately there does not appear to be any signs of active infection at this point which  is good news. She has been tolerating the dressing changes without complication. Overall I am pleased with that with that being said unfortunately she does  have a little bit of more significant breakdown with regard to the wound bed upon evaluation today. I believe this is secondary to the fact that she has been lying potentially too much in a single position for too long over the past several weeks. I think that potentially if she were to mitigate this to a degree that things may improve somewhat. With that being said she is really not happy with her current bed she is attempted to have them come out and switch this back to an air mattress but unfortunately has not been successful in getting them to come out and make this change. She is very frustrated with that. She still has the Clinitron bed and she much prefers just having the standard air mattress. Patient's primary care provider still working on this she tells me. 05/08/2019 on evaluation today patient unfortunately is not doing as well as I would like to see in regard to her wounds. She actually is measuring a little bit larger pretty much at all wound locations unfortunately and overall I am very upset as well with the fact that she has been trying to get the Clinitron bed removed from her home but is not having any luck as such. We did get the number for the company today in order for [place] to see about calling them and switching this over to an air mattress as this is giving her a lot of trouble she states is really hot and she feels like it is breaking down her skin. This is not good. 05/22/2019 on evaluation today patient appears to be doing well with regard to the wounds she is showing some signs of slight improvement at this point. This is good news. Fortunately there is no evidence of active infection at this time which is also good news. No fevers, chills, nausea, vomiting, or diarrhea. She does tell me that in order to get rid of the Clinitron bed that she has been working on that the representative from the company stated they needed in order for me asked to be getting rid of it and then  what we wanted her to have. Again I will be more than happy to do that we can initiate an air mattress for her. 06/06/2019 on evaluation today patient continues to have issues with her wounds. The good news is we figured out what to be done for the her bad as far as get not moved however she is getting actually ready to move and change residences. For that reason what they are going to do is come in and take the bed from the current residence to get rid of the Clinitron bed and then subsequently move the new bed into her new place of residence. This makes complete sense to me as well and I think that sounds like a good plan. At least we have a plan for what to do as far as getting things taken care of here. 06/24/2019 on evaluation today patient appears to be doing really about the same in some regards although a couple of the wounds are measuring a little bit larger and deeper compared to previous. With that being said I do think that unfortunately her wounds seem to be overall progressing in the wrong direction towards being worse not better. The patient is still having to use the Clinitron bed at  this time I do think that she really needs to switch to just a standard air mattress that is what she wants but she is having to wait until she moves in 2 weeks before she can get the new mattress so she does not have to pay to have the bed moved twice. 07/18/2019 on evaluation today patient appears to actually be doing somewhat better in regard to her wounds in general. I am very pleased in this regard. With that being said she still has some hyper granulation unfortunately the Hydrofera Blue just was getting stuck to her and the silver cell has done better. Nonetheless I do believe that we do need to probably treat this with some silver nitrate to try to help with the hypergranulation I discussed this with the patient today. She is in agreement with this plan. 07/29/2019 on evaluation today patient seems  to be making some progress albeit slow regard to her wounds. Fortunately there is no signs of active infection at this time. No fever chills noted. She overall seems to be doing quite well at this point. ABRISH, ERNY ([id number]) 08/12/2019 upon evaluation today patient appears to be doing about the same at this point in regard to her wounds. She still has some hyper granulation. I really feel like she would do better with Hydrofera Blue over the alginate itself. With that being said we have not been able to use that for a while as it was sticking but again it has been sometime she was having issues with a different bed at that time as well which may have contributed to some of the issues at that point. Fortunately there is no evidence of active infection at this time. No fevers, chills, nausea, vomiting, or diarrhea. 09/04/2019 upon evaluation today patient actually appears to be doing excellent in regard to her wounds across the board. I am extremely pleased with how things appear today. She has a lot of new epithelization, good granulation, and overall she states that she is doing very well with the current bed. Everything about today seems to be on the up and up. 09/25/2019 upon evaluation today patient appears to be doing excellent in regard to her wounds. She has been tolerating the dressing changes without complication. Fortunately there is no evidence of active infection at this time. No fevers, chills, nausea, vomiting, or diarrhea. Overall she is actually been making great progress since getting off of the air-fluidized mattress I am happy in that regard. 10/17/2019 upon evaluation today patient appears to be doing well at this point in regard to her wounds. She is making good progress at this time which is good news. Fortunately there is no signs of active infection at this time. No fevers, chills, nausea, vomiting, or diarrhea. 10/31/2019 upon evaluation today patient actually appears to be  doing excellent in regard to her wounds currently. I feel like the [facility] is doing a great job. Overall very pleased with the progress she is making. 11/21/2019; patient has wounds on her lower sacrum and right greater trochanter posteriorly. The wounds are epithelializing. No debridement was required. She has a level 3 surface for offloading. She tells [place] that she spends mostly all day in bed. She is a cervical level quadriplegia. 12/09/2019 upon evaluation today patient appears to be doing excellent in regard to her wounds. She has been tolerating the dressing changes without complication. Fortunately there does not appear to be any signs of active infection which is great news and overall  I am extremely pleased. I think her wounds are getting closer to complete resolution. 12/26/2019 upon evaluation today patient appears to be doing okay regard to her wounds currently. Fortunately there is no signs of active infection at this time. No fever chills noted. She has been taking care of of the wounds decently well but unfortunately is having some issues currently with staying up a little bit too much I think this has led to the wounds not being quite as good as what we were hoping for but nonetheless I think that she is still doing fairly well. 01/09/2020 upon evaluation today patient appears to be doing a little bit worse today compared to normal. Fortunately there is been no signs of active infection at this time which is good news. Fortunately there is no fevers, chills, nausea, vomiting, or diarrhea. With that being said her wounds do appear to be doing worse as far as the overall size is concerned that does have the somewhat concerned here as to be honest I think this is likely a direct result of too much pressure getting to the wounds at this time. I did have a note from the home health nurse asking about switching to a different dressing. That being said she is done so well with the [facility] I am somewhat reluctant to do so we have tried other dressings in the past without much good with success. With that being said I do believe that the patient is potentially in need of more appropriate and sustained offloading. 02/02/2020 on evaluation today patient appears to be doing a little bit more poorly in regard to her wounds currently. There is some evidence of possible infection here. I am concerned in that regard about infection and I think that this is likely based on what I am seeing. She is concerned as well about the amount of odor and drainage that she has been experiencing. 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 For about 4-5 months she's had a decubitus ulcer on the right scapular region. she was lost to follow-up for the last 6 months. she had a couple of small areas which have opened out in the region of previous scar tissue in the sacral region but these haveall healed now. She does not have any other significant comorbidities. She has a good Roho cushion for her wheelchair and she has a specialized mattress for sleep. She has restarted smoking and smokes about half a pack of cigarettes a day 02/18/2015 -- X-ray of the pelvis -- IMPRESSION:No acute osseous injury of the pelvis. If there is clinical concern regarding osteomyelitis, recommend further evaluation with CT. X-ray of the sacrum and coccyx IMPRESSION:No acute osseous abnormality of the sacrum. If there is further clinical concern regarding osteomyelitis of the sacrum, further evaluation with CT is recommended. ========= Electronic Signature(s) Signed: 02/02/2020 1:51:06 PM By: Lenda Kelp PA-C Entered By: Lenda Kelp on 02/02/2020 13:51:05 Pollinger, [name] ([id number]) -------------------------------------------------------------------------------- Physical Exam  Details Patient Name: AMYRIA, KOMAR. Date of Service: 02/02/2020 12:30 PM Medical Record Number: [id number] Patient Account Number: [id number] Date of Birth/Sex: 15-Oct-1965 (54 y.o. F) Treating RN: Huel Coventry Primary Care Provider: [place], JULIE Other Clinician: Referring Provider: [place], JULIE Treating Provider/Extender: STONE III, Kattie Santoyo Weeks in Treatment: 42 Constitutional Thin and well-hydrated in no acute distress. Respiratory normal breathing without difficulty. Psychiatric this patient is able to make  decisions and demonstrates good insight into disease process. Alert and Oriented x 3. pleasant and cooperative. Notes Upon inspection patient's wounds currently showed signs of potential infection with increased drainage and overall odor as well unfortunately. With that being said I do believe that the patient is going to need to have antibiotics. I will likely start her on something today and then will make any adjustments as needed we will get the results of the culture back which was also obtained today. Electronic Signature(s) Signed: 02/02/2020 2:20:32 PM By: Lenda KelpStone III, Delcia Spitzley PA-C Entered By: Lenda KelpStone III, Marshawn Normoyle on 02/02/2020 14:20:31 Chance, [name] ([id number]) -------------------------------------------------------------------------------- Physician Orders Details Patient Name: [name], Mackenze M. Date of Service: 02/02/2020 12:30 PM Medical Record Number: [id number] Patient Account Number: [id number] Date of Birth/Sex: 23-Sep-1965 (54 y.o. F) Treating RN: Rodell PernaScott, Dajea Primary Care Provider: [place], JULIE Other Clinician: Referring Provider: [place], JULIE Treating Provider/Extender: Linwood DibblesSTONE III, Aribelle Mccosh Weeks in Treatment: 942 Verbal / Phone Orders: No Diagnosis Coding ICD-10 Coding Code Description L89.313 Pressure ulcer of right buttock, stage 3 L89.323 Pressure ulcer of left buttock, stage 3 L89.213 Pressure ulcer of right hip, stage 3 G82.52 Quadriplegia, C1-C4  incomplete E44.0 Moderate protein-calorie malnutrition Wound Cleansing Wound #5 Right Sacrum o Clean wound with Normal Saline. o May Shower, gently pat wound dry prior to applying new dressing. Wound #7 Right Trochanter o Clean wound with Normal Saline. o May Shower, gently pat wound dry prior to applying new dressing. Primary Wound Dressing Wound #5 Right Sacrum o Silver Alginate Wound #7 Right Trochanter o Santyl Ointment - on wound bed with scell on top o Silver Alginate Secondary Dressing Wound #5 Right Sacrum o ABD pad - Secure with tape or bordered foam dressing Wound #7 Right Trochanter o ABD pad - Secure with tape or bordered foam dressing Dressing Change Frequency Wound #5 Right Sacrum o Change Dressing Monday, Wednesday, Friday - or three times weekly Wound #7 Right Trochanter o Change Dressing Monday, Wednesday, Friday - or three times weekly Follow-up Appointments Wound #5 Right Sacrum o Return Appointment in 1 week. Wound #7 Right Trochanter o Return Appointment in 1 week. Off-Loading Wound #5 Right Sacrum o Mattress - air mattress or fluidized air mattress o Turn and reposition every 2 hours Wound #7 Right Trochanter o Mattress - air mattress or fluidized air mattress Blanton, Brittny M. ([id number]) o Turn and reposition every 2 hours Additional Orders / Instructions Wound #5 Right Sacrum o Increase protein intake. o Increase protein intake. Wound #7 Right Trochanter o Increase protein intake. o Increase protein intake. Home Health Wound #5 Right Sacrum o Continue Home Health Visits - [facility] - Please provide dressing materials for patient o Home Health Nurse may visit PRN to address patientos wound care needs. o FACE TO FACE ENCOUNTER: MEDICARE and MEDICAID PATIENTS: I certify that this patient is under my care and that I had a face-to-face encounter that meets the physician face-to-face encounter  requirements with this patient on this date. The encounter with the patient was in whole or in part for the following MEDICAL CONDITION: (primary reason for Home Healthcare) MEDICAL NECESSITY: I certify, that based on my findings, NURSING services are a medically necessary home health service. HOME BOUND STATUS: I certify that my clinical findings support that this patient is homebound (i.e., Due to illness or injury, pt requires aid of supportive devices such as crutches, cane, wheelchairs, walkers, the use of special transportation or the assistance of another person to leave their place of residence. There  is a normal inability to leave the home and doing so requires considerable and taxing effort. Other absences are for medical reasons / religious services and are infrequent or of short duration when for other reasons). o If current dressing causes regression in wound condition, may D/C ordered dressing product/s and apply Normal Saline Moist Dressing daily until next Wound Healing Center / Other MD appointment. Notify Wound Healing Center of regression in wound condition at [phone]. o Please direct any NON-WOUND related issues/requests for orders to patient's Primary Care Physician Wound #7 Right Trochanter o Continue Home Health Visits - [facility] - Please provide dressing materials for patient o Home Health Nurse may visit PRN to address patientos wound care needs. o FACE TO FACE ENCOUNTER: MEDICARE and MEDICAID PATIENTS: I certify that this patient is under my care and that I had a face-to-face encounter that meets the physician face-to-face encounter requirements with this patient on this date. The encounter with the patient was in whole or in part for the following MEDICAL CONDITION: (primary reason for Home Healthcare) MEDICAL NECESSITY: I certify, that based on my findings, NURSING services are a medically necessary home health service. HOME BOUND STATUS: I certify that  my clinical findings support that this patient is homebound (i.e., Due to illness or injury, pt requires aid of supportive devices such as crutches, cane, wheelchairs, walkers, the use of special transportation or the assistance of another person to leave their place of residence. There is a normal inability to leave the home and doing so requires considerable and taxing effort. Other absences are for medical reasons / religious services and are infrequent or of short duration when for other reasons). o If current dressing causes regression in wound condition, may D/C ordered dressing product/s and apply Normal Saline Moist Dressing daily until next Wound Healing Center / Other MD appointment. Notify Wound Healing Center of regression in wound condition at [phone]. o Please direct any NON-WOUND related issues/requests for orders to patient's Primary Care Physician Laboratory o Bacteria identified in Wound by Culture (MICRO) - Right trochanter oooo LOINC Code: 6462-6 oooo Convenience Name: Wound culture routine Patient Medications Allergies: Shellfish Containing Products Notifications Medication Indication Start End doxycycline hyclate 02/02/2020 DOSE 1 - oral 100 mg capsule - 1 capsule oral taken 2 times per day for 14 days Bactrim DS 02/10/2020 DOSE 1 - oral 800 mg-160 mg tablet - 1 tablet oral taken 2 times per day for 14 days Electronic Signature(s) Signed: 02/10/2020 12:53:38 PM By: Lenda Kelp PA-C Previous Signature: 02/02/2020 2:24:07 PM Version By: Lenda Kelp PA-C Previous Signature: 02/02/2020 1:41:08 PM Version By: Rodell Perna Entered By: Lenda Kelp on 02/10/2020 12:53:35 Brunell, Cailee M. ([id number]) Strassman, Marbella M. ([id number]) -------------------------------------------------------------------------------- Problem List Details Patient Name: HELAYNA, DUN. Date of Service: 02/02/2020 12:30 PM Medical Record Number: [id number] Patient Account Number:  [id number] Date of Birth/Sex: 1966-06-24 (54 y.o. F) Treating RN: Huel Coventry Primary Care Provider: [place], JULIE Other Clinician: Referring Provider: [place], JULIE Treating Provider/Extender: Linwood Dibbles, Faisal Stradling Weeks in Treatment: 73 Active Problems ICD-10 Encounter Code Description Active Date MDM Diagnosis L89.313 Pressure ulcer of right buttock, stage 3 04/08/2019 No Yes L89.323 Pressure ulcer of left buttock, stage 3 04/08/2019 No Yes L89.213 Pressure ulcer of right hip, stage 3 04/08/2019 No Yes G82.52 Quadriplegia, C1-C4 incomplete 04/08/2019 No Yes E44.0 Moderate protein-calorie malnutrition 04/08/2019 No Yes Inactive Problems Resolved Problems Electronic Signature(s) Signed: 02/02/2020 1:20:47 PM By: Lenda Kelp PA-C Entered By: Lenda Kelp on  02/02/2020 13:20:47 MAILLE, HALLIWELL ([id number]) -------------------------------------------------------------------------------- Progress Note Details Patient Name: DERYL, PORTS. Date of Service: 02/02/2020 12:30 PM Medical Record Number: [id number] Patient Account Number: [id number] Date of Birth/Sex: 06/19/1966 (54 y.o. F) Treating RN: Huel Coventry Primary Care Provider: [place], JULIE Other Clinician: Referring Provider: [place], JULIE Treating Provider/Extender: Linwood Dibbles, Emiliano Welshans Weeks in Treatment: 42 Subjective Chief Complaint Information obtained from Patient Bilateral gluteal and right hip pressure ulcers History of Present Illness (HPI) 54 year old patient who was been an unreliable historian says she had completely healed in February and most recently her home health noticed that the wound on the right scapular area had reopened. She was in hospital about a month ago with pneumonia at that time a workup was done. We have no notes today but will workup the electronic medical record. on review of her records electronically I do not find any recent hospital MRI in the last 6 months. She had a CT scan of the chest in February  2018 which does not show any osseous lesions. she was admitted to the hospital between May 14 and May 17 for 3 days for mucus plugging and collapse of the left lung. at that time she had a decubitus ulcer of the right scapular region stage III. her history was noted to have quadriplegia C5-C7 incomplete, neurogenic bladder, nephrostomy catheter and emphysema. patient tells me that she has not smoked for last month and a half but as per her hospital records she was smoking in the middle of May. 01/12/17 patient scapular region appears to be doing fairly well on evaluation today. She is having really no syndicate discomfort and has been tolerating the dressing changes well complication. Readmission: 04/08/2019 patient presents today for reevaluation in our clinic concerning issues she is having with wounds on the bilateral gluteal area as well as on the right trochanter. She has unfortunately noted that things seem to have gotten worse as she was using the Clinitron bed. The patient states it gives off a lot of heat and subsequently has not been good for her skin causing her to breakdown more. She has been trying to get back just to the air mattress but has not been able to get the company to come pick up that bed and bring out the air mattress. Her primary care provider is on this as well trying to get them to do so. Unfortunately her wounds are significantly large and show signs of hyper granulation as well. She has had this kind of issue for quite some time and unfortunately just does not seem to be showing signs of significant improvement with what she has been using currently. No fevers, chills, nausea, vomiting, or diarrhea. She does attempt appropriate offloading and again has been using the bed because that is all she has although she is not happy with it. 04/22/2019 on evaluation today patient appears to be doing about the same with regard to her wounds at this time. Fortunately there does  not appear to be any signs of active infection at this point which is good news. She has been tolerating the dressing changes without complication. Overall I am pleased with that with that being said unfortunately she does have a little bit of more significant breakdown with regard to the wound bed upon evaluation today. I believe this is secondary to the fact that she has been lying potentially too much in a single position for too long over the past several weeks. I think that potentially if she were  to mitigate this to a degree that things may improve somewhat. With that being said she is really not happy with her current bed she is attempted to have them come out and switch this back to an air mattress but unfortunately has not been successful in getting them to come out and make this change. She is very frustrated with that. She still has the Clinitron bed and she much prefers just having the standard air mattress. Patient's primary care provider still working on this she tells me. 05/08/2019 on evaluation today patient unfortunately is not doing as well as I would like to see in regard to her wounds. She actually is measuring a little bit larger pretty much at all wound locations unfortunately and overall I am very upset as well with the fact that she has been trying to get the Clinitron bed removed from her home but is not having any luck as such. We did get the number for the company today in order for [place] to see about calling them and switching this over to an air mattress as this is giving her a lot of trouble she states is really hot and she feels like it is breaking down her skin. This is not good. 05/22/2019 on evaluation today patient appears to be doing well with regard to the wounds she is showing some signs of slight improvement at this point. This is good news. Fortunately there is no evidence of active infection at this time which is also good news. No fevers, chills,  nausea, vomiting, or diarrhea. She does tell me that in order to get rid of the Clinitron bed that she has been working on that the representative from the company stated they needed in order for me asked to be getting rid of it and then what we wanted her to have. Again I will be more than happy to do that we can initiate an air mattress for her. 06/06/2019 on evaluation today patient continues to have issues with her wounds. The good news is we figured out what to be done for the her bad as far as get not moved however she is getting actually ready to move and change residences. For that reason what they are going to do is come in and take the bed from the current residence to get rid of the Clinitron bed and then subsequently move the new bed into her new place of residence. This makes complete sense to me as well and I think that sounds like a good plan. At least we have a plan for what to do as far as getting things taken care of here. 06/24/2019 on evaluation today patient appears to be doing really about the same in some regards although a couple of the wounds are measuring a little bit larger and deeper compared to previous. With that being said I do think that unfortunately her wounds seem to be overall progressing in the wrong direction towards being worse not better. The patient is still having to use the Clinitron bed at this time I do think that she really needs to switch to just a standard air mattress that is what she wants but she is having to wait until she moves in 2 weeks before she can get the new mattress so she does not have to pay to have the bed moved twice. 07/18/2019 on evaluation today patient appears to actually be doing somewhat better in regard to her wounds in general. I am very pleased in  this regard. With that being said she still has some hyper granulation unfortunately the Hydrofera Blue just was getting stuck to her and the silver cell has done better. Nonetheless I  do believe that we do need to probably treat this with some silver nitrate to try to help with the hypergranulation I discussed this with the patient today. She is in agreement with this plan. EUPHEMIA, LINGERFELT ([id number]) 07/29/2019 on evaluation today patient seems to be making some progress albeit slow regard to her wounds. Fortunately there is no signs of active infection at this time. No fever chills noted. She overall seems to be doing quite well at this point. 08/12/2019 upon evaluation today patient appears to be doing about the same at this point in regard to her wounds. She still has some hyper granulation. I really feel like she would do better with Hydrofera Blue over the alginate itself. With that being said we have not been able to use that for a while as it was sticking but again it has been sometime she was having issues with a different bed at that time as well which may have contributed to some of the issues at that point. Fortunately there is no evidence of active infection at this time. No fevers, chills, nausea, vomiting, or diarrhea. 09/04/2019 upon evaluation today patient actually appears to be doing excellent in regard to her wounds across the board. I am extremely pleased with how things appear today. She has a lot of new epithelization, good granulation, and overall she states that she is doing very well with the current bed. Everything about today seems to be on the up and up. 09/25/2019 upon evaluation today patient appears to be doing excellent in regard to her wounds. She has been tolerating the dressing changes without complication. Fortunately there is no evidence of active infection at this time. No fevers, chills, nausea, vomiting, or diarrhea. Overall she is actually been making great progress since getting off of the air-fluidized mattress I am happy in that regard. 10/17/2019 upon evaluation today patient appears to be doing well at this point in regard to her wounds.  She is making good progress at this time which is good news. Fortunately there is no signs of active infection at this time. No fevers, chills, nausea, vomiting, or diarrhea. 10/31/2019 upon evaluation today patient actually appears to be doing excellent in regard to her wounds currently. I feel like the [facility] is doing a great job. Overall very pleased with the progress she is making. 11/21/2019; patient has wounds on her lower sacrum and right greater trochanter posteriorly. The wounds are epithelializing. No debridement was required. She has a level 3 surface for offloading. She tells [place] that she spends mostly all day in bed. She is a cervical level quadriplegia. 12/09/2019 upon evaluation today patient appears to be doing excellent in regard to her wounds. She has been tolerating the dressing changes without complication. Fortunately there does not appear to be any signs of active infection which is great news and overall I am extremely pleased. I think her wounds are getting closer to complete resolution. 12/26/2019 upon evaluation today patient appears to be doing okay regard to her wounds currently. Fortunately there is no signs of active infection at this time. No fever chills noted. She has been taking care of of the wounds decently well but unfortunately is having some issues currently with staying up a little bit too much I think this has led to the wounds  not being quite as good as what we were hoping for but nonetheless I think that she is still doing fairly well. 01/09/2020 upon evaluation today patient appears to be doing a little bit worse today compared to normal. Fortunately there is been no signs of active infection at this time which is good news. Fortunately there is no fevers, chills, nausea, vomiting, or diarrhea. With that being said her wounds do appear to be doing worse as far as the overall size is concerned that does have the somewhat concerned here as to be honest I  think this is likely a direct result of too much pressure getting to the wounds at this time. I did have a note from the home health nurse asking about switching to a different dressing. That being said she is done so well with the [facility] I am somewhat reluctant to do so we have tried other dressings in the past without much good with success. With that being said I do believe that the patient is potentially in need of more appropriate and sustained offloading. 02/02/2020 on evaluation today patient appears to be doing a little bit more poorly in regard to her wounds currently. There is some evidence of possible infection here. I am concerned in that regard about infection and I think that this is likely based on what I am seeing. She is concerned as well about the amount of odor and drainage that she has been experiencing. 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 For about 4-5 months she's had a decubitus ulcer on the right scapular region. she was lost to follow-up for the last 6 months. she had a couple of small areas which have opened out in the region of previous scar tissue in the sacral region but these haveall healed now. She does not have any other significant comorbidities. She has a good Roho cushion for her wheelchair and she has a specialized mattress for sleep. She has restarted smoking and smokes about half a pack of cigarettes a day 02/18/2015 -- X-ray of the pelvis -- IMPRESSION:No acute osseous injury of the pelvis. If there is clinical concern regarding osteomyelitis, recommend further evaluation with CT. X-ray of the sacrum and coccyx IMPRESSION:No acute osseous abnormality of the sacrum. If there is further clinical concern regarding osteomyelitis of the sacrum, further evaluation with CT is  recommended. ========= Objective Constitutional Thin and well-hydrated in no acute distress. Vitals Time Taken: 12:45 PM, Height: 63 in, Weight: 70 lbs, BMI: 12.4, Temperature: 98.7 F, Pulse: 78 bpm, Respiratory Rate: 16 breaths/min, Blood Pressure: 117/69 mmHg. Respiratory Hlavaty, Olita M. ([id number]) normal breathing without difficulty. Psychiatric this patient is able to make decisions and demonstrates good insight into disease process. Alert and Oriented x 3. pleasant and cooperative. General Notes: Upon inspection patient's wounds currently showed signs of potential infection with increased drainage and overall odor as well unfortunately. With that being said I do believe that the patient is going to need to have antibiotics. I will likely start her on something today and then will make any adjustments as needed we will get the results of the culture back which was also obtained today. Integumentary (Hair, Skin) Wound #5 status is Open. Original cause of wound was Gradually Appeared. The wound is located on the Right Sacrum. The wound measures 8.2cm length x 12cm width x 0.1cm depth; 77.283cm^2 area and 7.728cm^3 volume.  There is Fat Layer (Subcutaneous Tissue) Exposed exposed. There is a medium amount of serous drainage noted. The wound margin is flat and intact. There is large (67-100%) pink, hyper - granulation within the wound bed. There is a small (1-33%) amount of necrotic tissue within the wound bed including Adherent Slough. Wound #7 status is Open. Original cause of wound was Gradually Appeared. The wound is located on the Right Trochanter. The wound measures 4.5cm length x 3.2cm width x 1cm depth; 11.31cm^2 area and 11.31cm^3 volume. There is Fat Layer (Subcutaneous Tissue) Exposed exposed. There is a medium amount of serous drainage noted. The wound margin is flat and intact. There is large (67-100%) red, hyper - granulation within the wound bed. There is a small (1-33%)  amount of necrotic tissue within the wound bed including Adherent Slough. 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 May Shower, gently pat wound dry prior to applying new dressing. Wound #7 Right Trochanter: Clean wound with Normal Saline. May Shower, gently pat wound dry prior to applying new dressing. Primary Wound Dressing: Wound #5 Right Sacrum: Silver Alginate Wound #7 Right Trochanter: Santyl Ointment - on wound bed with scell on top Silver Alginate Secondary Dressing: Wound #5 Right Sacrum: ABD pad - Secure with tape or bordered foam dressing Wound #7 Right Trochanter: ABD pad - Secure with tape or bordered foam dressing Dressing Change Frequency: Wound #5 Right Sacrum: Change Dressing Monday, Wednesday, Friday - or three times weekly Wound #7 Right Trochanter: Change Dressing Monday, Wednesday, Friday - or three times weekly Follow-up Appointments: Wound #5 Right Sacrum: Return Appointment in 1 week. Wound #7 Right Trochanter: Return Appointment in 1 week. Off-Loading: Wound #5 Right Sacrum: Mattress - air mattress or fluidized air mattress Turn and reposition every 2 hours Wound #7 Right Trochanter: Mattress - air mattress or fluidized air mattress Turn and reposition every 2 hours Barga, Nadina M. ([id number]) Additional Orders / Instructions: Wound #5 Right Sacrum: Increase protein intake. Increase protein intake. Wound #7 Right Trochanter: Increase protein intake. Increase protein intake. Home Health: Wound #5 Right Sacrum: Continue Home Health Visits - [facility] - Please provide dressing materials for patient Home Health Nurse may visit PRN to address patient s wound care needs. FACE TO FACE  ENCOUNTER: MEDICARE and MEDICAID PATIENTS: I certify that this patient is under my care and that I had a face-to-face encounter that meets the physician face-to-face encounter requirements with this patient on this date. The encounter with the patient was in whole or in part for the following MEDICAL CONDITION: (primary reason for Home Healthcare) MEDICAL NECESSITY: I certify, that based on my findings, NURSING services are a medically necessary home health service. HOME BOUND STATUS: I certify that my clinical findings support that this patient is homebound (i.e., Due to illness or injury, pt requires aid of supportive devices such as crutches, cane, wheelchairs, walkers, the use of special transportation or the assistance of another person to leave their place of residence. There is a normal inability to leave the home and doing so requires considerable and taxing effort. Other absences are for medical reasons / religious services and are infrequent or of short duration when for other reasons). If current dressing causes regression in wound condition, may D/C ordered dressing product/s and apply Normal Saline Moist  Dressing daily until next Wound Healing Center / Other MD appointment. Notify Wound Healing Center of regression in wound condition at [phone]. Please direct any NON-WOUND related issues/requests for orders to patient's Primary Care Physician Wound #7 Right Trochanter: Continue Home Health Visits - [facility] - Please provide dressing materials for patient Home Health Nurse may visit PRN to address patient s wound care needs. FACE TO FACE ENCOUNTER: MEDICARE and MEDICAID PATIENTS: I certify that this patient is under my care and that I had a face-to-face encounter that meets the physician face-to-face encounter requirements with this patient on this date. The encounter with the patient was in whole or in part for the following MEDICAL CONDITION: (primary reason for Home Healthcare)  MEDICAL NECESSITY: I certify, that based on my findings, NURSING services are a medically necessary home health service. HOME BOUND STATUS: I certify that my clinical findings support that this patient is homebound (i.e., Due to illness or injury, pt requires aid of supportive devices such as crutches, cane, wheelchairs, walkers, the use of special transportation or the assistance of another person to leave their place of residence. There is a normal inability to leave the home and doing so requires considerable and taxing effort. Other absences are for medical reasons / religious services and are infrequent or of short duration when for other reasons). If current dressing causes regression in wound condition, may D/C ordered dressing product/s and apply Normal Saline Moist Dressing daily until next Wound Healing Center / Other MD appointment. Notify Wound Healing Center of regression in wound condition at [phone]. Please direct any NON-WOUND related issues/requests for orders to patient's Primary Care Physician Laboratory ordered were: Wound culture routine - Right trochanter The following medication(s) was prescribed: doxycycline hyclate oral 100 mg capsule 1 1 capsule oral taken 2 times per day for 14 days starting 02/02/2020 Bactrim DS oral 800 mg-160 mg tablet 1 1 tablet oral taken 2 times per day for 14 days starting 02/10/2020 1. I am can recommend at this time that we go ahead and initiate treatment with an antibiotic until we get the results of the culture back, suggest doxycycline for the patient as I feel like this may be one of the best options. I did obtain a wound culture today which is pending results. 2. I am also can recommend that we actually switch to a silver alginate dressing for the time being in order to help with drainage control. I think the more that were able to control with regard the better. 3. I am getting suggest the patient needs to continue with appropriate  offloading. This is still of utmost importance in order to get this under control as far as the wounds are concerned. We will see patient back for reevaluation in 1 week here in the clinic. If anything worsens or changes patient will contact our office for additional recommendations. 02/10/2020 I did review patient's culture today and subsequently the doxycycline unfortunately is not to be of benefit for her. For that reason I am going to switch to Bactrim DS and that was sent into her pharmacy today. We will see her back for reevaluation to see where things stand. Electronic Signature(s) Signed: 02/10/2020 12:55:35 PM By: Lenda Kelp PA-C Previous Signature: 02/02/2020 2:24:50 PM Version By: Lenda Kelp PA-C Entered By: Lenda Kelp on 02/10/2020 12:55:35 Sobotta, [name] ([id number]) -------------------------------------------------------------------------------- SuperBill Details Patient Name: [name] Date of Service: 02/02/2020 Medical Record Number: [id number] Patient Account Number: [id number] Date of Birth/Sex:  04-Jul-1965 (54 y.o. F) Treating RN: Huel Coventry Primary Care Provider: [place], JULIE Other Clinician: Referring Provider: [place], JULIE Treating Provider/Extender: Linwood Dibbles, Halleigh Comes Weeks in Treatment: 42 Diagnosis Coding ICD-10 Codes Code Description L89.313 Pressure ulcer of right buttock, stage 3 L89.323 Pressure ulcer of left buttock, stage 3 L89.213 Pressure ulcer of right hip, stage 3 G82.52 Quadriplegia, C1-C4 incomplete E44.0 Moderate protein-calorie malnutrition Facility Procedures CPT4 Code: 16109604 Description: 99213 - WOUND CARE VISIT-LEV 3 EST PT Modifier: Quantity: 1 Physician Procedures CPT4 Code: 5409811 Description: 99214 - WC PHYS LEVEL 4 - EST PT Modifier: Quantity: 1 CPT4 Code: Description: ICD-10 Diagnosis Description L89.313 Pressure ulcer of right buttock, stage 3 L89.323 Pressure ulcer of left buttock, stage 3 L89.213  Pressure ulcer of right hip, stage 3 G82.52 Quadriplegia, C1-C4 incomplete Modifier: Quantity: Electronic Signature(s) Signed: 02/02/2020 2:25:31 PM By: Lenda Kelp PA-C Entered By: Lenda Kelp on 02/02/2020 14:25:29

## 2020-02-04 LAB — AEROBIC CULTURE W GRAM STAIN (SUPERFICIAL SPECIMEN): Gram Stain: NONE SEEN

## 2020-02-09 ENCOUNTER — Ambulatory Visit: Payer: Medicare Other | Admitting: Physician Assistant

## 2020-02-19 ENCOUNTER — Ambulatory Visit: Payer: Medicare Other | Admitting: Physician Assistant

## 2020-02-26 ENCOUNTER — Encounter: Payer: Medicare Other | Admitting: Physician Assistant

## 2020-02-26 DIAGNOSIS — L89213 Pressure ulcer of right hip, stage 3: Secondary | ICD-10-CM | POA: Diagnosis not present

## 2020-02-26 NOTE — Progress Notes (Signed)
[name], [name] ([id number]) Visit Report for 02/26/2020 Chief Complaint Document Details Patient Name: [name], [name] Date of Service: 02/26/2020 2:30 PM Medical Record Number: [id number] Patient Account Number: [id number] Date of Birth/Sex: [birth year] (54 y.o. F) Treating RN: Huel Coventry Primary Care Provider: [place], JULIE Other Clinician: Referring Provider: [place], JULIE Treating Provider/Extender: Linwood Dibbles, Graden Hoshino Weeks in Treatment: 85 Information Obtained from: Patient Chief Complaint Bilateral gluteal and right hip pressure ulcers Electronic Signature(s) Signed: 02/26/2020 2:46:08 PM By: Lenda Kelp PA-C Entered By: Lenda Kelp on 02/26/2020 14:46:07 Hauswirth, [name] ([id number]) -------------------------------------------------------------------------------- HPI Details Patient Name: [name], [name]. Date of Service: 02/26/2020 2:30 PM Medical Record Number: [id number] Patient Account Number: [id number] Date of Birth/Sex: 08/01/1965 (54 y.o. F) Treating RN: Huel Coventry Primary Care Provider: [place], JULIE Other Clinician: Referring Provider: [place], JULIE Treating Provider/Extender: Linwood Dibbles, Jentzen Minasyan Weeks in Treatment: 75 History of Present Illness HPI Description: 54 year old patient who was been an unreliable historian says she had completely healed in February and most recently her home health noticed that the wound on the right scapular area had reopened. She was in hospital about a month ago with pneumonia at that time a workup was done. We have no notes today but will workup the electronic medical record. on review of her records electronically I do not find any recent hospital MRI in the last 6 months. She had a CT scan of the chest in February 2018 which does not show any osseous lesions. she was admitted to the hospital between May 14 and May 17 for 3 days for mucus plugging and collapse of the left lung. at that time she had a decubitus ulcer of the right  scapular region stage III. her history was noted to have quadriplegia C5-C7 incomplete, neurogenic bladder, nephrostomy catheter and emphysema. patient tells me that she has not smoked for last month and a half but as per her hospital records she was smoking in the middle of May. 01/12/17 patient scapular region appears to be doing fairly well on evaluation today. She is having really no syndicate discomfort and has been tolerating the dressing changes well complication. Readmission: 04/08/2019 patient presents today for reevaluation in our clinic concerning issues she is having with wounds on the bilateral gluteal area as well as on the right trochanter. She has unfortunately noted that things seem to have gotten worse as she was using the Clinitron bed. The patient states it gives off a lot of heat and subsequently has not been good for her skin causing her to breakdown more. She has been trying to get back just to the air mattress but has not been able to get the company to come pick up that bed and bring out the air mattress. Her primary care provider is on this as well trying to get them to do so. Unfortunately her wounds are significantly large and show signs of hyper granulation as well. She has had this kind of issue for quite some time and unfortunately just does not seem to be showing signs of significant improvement with what she has been using currently. No fevers, chills, nausea, vomiting, or diarrhea. She does attempt appropriate offloading and again has been using the bed because that is all she has although she is not happy with it. 04/22/2019 on evaluation today patient appears to be doing about the same with regard to her wounds at this time. Fortunately there does not appear to be any signs of active infection at this point which  is good news. She has been tolerating the dressing changes without complication. Overall I am pleased with that with that being said unfortunately she does  have a little bit of more significant breakdown with regard to the wound bed upon evaluation today. I believe this is secondary to the fact that she has been lying potentially too much in a single position for too long over the past several weeks. I think that potentially if she were to mitigate this to a degree that things may improve somewhat. With that being said she is really not happy with her current bed she is attempted to have them come out and switch this back to an air mattress but unfortunately has not been successful in getting them to come out and make this change. She is very frustrated with that. She still has the Clinitron bed and she much prefers just having the standard air mattress. Patient's primary care provider still working on this she tells me. 05/08/2019 on evaluation today patient unfortunately is not doing as well as I would like to see in regard to her wounds. She actually is measuring a little bit larger pretty much at all wound locations unfortunately and overall I am very upset as well with the fact that she has been trying to get the Clinitron bed removed from her home but is not having any luck as such. We did get the number for the company today in order for [place] to see about calling them and switching this over to an air mattress as this is giving her a lot of trouble she states is really hot and she feels like it is breaking down her skin. This is not good. 05/22/2019 on evaluation today patient appears to be doing well with regard to the wounds she is showing some signs of slight improvement at this point. This is good news. Fortunately there is no evidence of active infection at this time which is also good news. No fevers, chills, nausea, vomiting, or diarrhea. She does tell me that in order to get rid of the Clinitron bed that she has been working on that the representative from the company stated they needed in order for me asked to be getting rid of it and then  what we wanted her to have. Again I will be more than happy to do that we can initiate an air mattress for her. 06/06/2019 on evaluation today patient continues to have issues with her wounds. The good news is we figured out what to be done for the her bad as far as get not moved however she is getting actually ready to move and change residences. For that reason what they are going to do is come in and take the bed from the current residence to get rid of the Clinitron bed and then subsequently move the new bed into her new place of residence. This makes complete sense to me as well and I think that sounds like a good plan. At least we have a plan for what to do as far as getting things taken care of here. 06/24/2019 on evaluation today patient appears to be doing really about the same in some regards although a couple of the wounds are measuring a little bit larger and deeper compared to previous. With that being said I do think that unfortunately her wounds seem to be overall progressing in the wrong direction towards being worse not better. The patient is still having to use the Clinitron bed at  this time I do think that she really needs to switch to just a standard air mattress that is what she wants but she is having to wait until she moves in 2 weeks before she can get the new mattress so she does not have to pay to have the bed moved twice. 07/18/2019 on evaluation today patient appears to actually be doing somewhat better in regard to her wounds in general. I am very pleased in this regard. With that being said she still has some hyper granulation unfortunately the Hydrofera Blue just was getting stuck to her and the silver cell has done better. Nonetheless I do believe that we do need to probably treat this with some silver nitrate to try to help with the hypergranulation I discussed this with the patient today. She is in agreement with this plan. 07/29/2019 on evaluation today patient seems  to be making some progress albeit slow regard to her wounds. Fortunately there is no signs of active infection at this time. No fever chills noted. She overall seems to be doing quite well at this point. ABRISH, ERNY ([id number]) 08/12/2019 upon evaluation today patient appears to be doing about the same at this point in regard to her wounds. She still has some hyper granulation. I really feel like she would do better with Hydrofera Blue over the alginate itself. With that being said we have not been able to use that for a while as it was sticking but again it has been sometime she was having issues with a different bed at that time as well which may have contributed to some of the issues at that point. Fortunately there is no evidence of active infection at this time. No fevers, chills, nausea, vomiting, or diarrhea. 09/04/2019 upon evaluation today patient actually appears to be doing excellent in regard to her wounds across the board. I am extremely pleased with how things appear today. She has a lot of new epithelization, good granulation, and overall she states that she is doing very well with the current bed. Everything about today seems to be on the up and up. 09/25/2019 upon evaluation today patient appears to be doing excellent in regard to her wounds. She has been tolerating the dressing changes without complication. Fortunately there is no evidence of active infection at this time. No fevers, chills, nausea, vomiting, or diarrhea. Overall she is actually been making great progress since getting off of the air-fluidized mattress I am happy in that regard. 10/17/2019 upon evaluation today patient appears to be doing well at this point in regard to her wounds. She is making good progress at this time which is good news. Fortunately there is no signs of active infection at this time. No fevers, chills, nausea, vomiting, or diarrhea. 10/31/2019 upon evaluation today patient actually appears to be  doing excellent in regard to her wounds currently. I feel like the [facility] is doing a great job. Overall very pleased with the progress she is making. 11/21/2019; patient has wounds on her lower sacrum and right greater trochanter posteriorly. The wounds are epithelializing. No debridement was required. She has a level 3 surface for offloading. She tells [place] that she spends mostly all day in bed. She is a cervical level quadriplegia. 12/09/2019 upon evaluation today patient appears to be doing excellent in regard to her wounds. She has been tolerating the dressing changes without complication. Fortunately there does not appear to be any signs of active infection which is great news and overall  I am extremely pleased. I think her wounds are getting closer to complete resolution. 12/26/2019 upon evaluation today patient appears to be doing okay regard to her wounds currently. Fortunately there is no signs of active infection at this time. No fever chills noted. She has been taking care of of the wounds decently well but unfortunately is having some issues currently with staying up a little bit too much I think this has led to the wounds not being quite as good as what we were hoping for but nonetheless I think that she is still doing fairly well. 01/09/2020 upon evaluation today patient appears to be doing a little bit worse today compared to normal. Fortunately there is been no signs of active infection at this time which is good news. Fortunately there is no fevers, chills, nausea, vomiting, or diarrhea. With that being said her wounds do appear to be doing worse as far as the overall size is concerned that does have the somewhat concerned here as to be honest I think this is likely a direct result of too much pressure getting to the wounds at this time. I did have a note from the home health nurse asking about switching to a different dressing. That being said she is done so well with the [facility] I am somewhat reluctant to do so we have tried other dressings in the past without much good with success. With that being said I do believe that the patient is potentially in need of more appropriate and sustained offloading. 02/02/2020 on evaluation today patient appears to be doing a little bit more poorly in regard to her wounds currently. There is some evidence of possible infection here. I am concerned in that regard about infection and I think that this is likely based on what I am seeing. She is concerned as well about the amount of odor and drainage that she has been experiencing. 02/26/2020 on evaluation today patient appears to be doing somewhat better in regard to her wounds which is good news. Fortunately there is no signs of active infection at this time. She has been using Santyl just over the hip flexor region and then alginate over everything else which has done a good job in my opinion. 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 For about 4-5 months she's had a decubitus ulcer on the right scapular region. she was lost to follow-up for the last 6 months. she had a couple of small areas which have opened out in the region of previous scar tissue in the sacral region but these haveall healed now. She does not have any other significant comorbidities. She has a good Roho cushion for her wheelchair and she has a specialized mattress for sleep. She has restarted smoking and smokes about half a pack of cigarettes a day 02/18/2015 -- X-ray of the pelvis -- IMPRESSION:No acute osseous injury of the pelvis. If there is clinical concern regarding osteomyelitis, recommend further evaluation with CT. X-ray of the sacrum and coccyx IMPRESSION:No acute osseous abnormality of the sacrum. If there is further clinical concern regarding osteomyelitis of the sacrum,  further evaluation with CT is recommended. ========= Electronic Signature(s) Signed: 02/26/2020 3:04:48 PM By: Lenda Kelp PA-C Entered By: Lenda Kelp on 02/26/2020 15:04:48 Corkins, [name] ([id number]) -------------------------------------------------------------------------------- Physical Exam Details Patient Name: SHIRLENE, ANDAYA. Date of Service: 02/26/2020 2:30 PM Medical Record  Number: [id number] Patient Account Number: [id number] Date of Birth/Sex: 10-25-1965 (54 y.o. F) Treating RN: Huel Coventry Primary Care Provider: [place], JULIE Other Clinician: Referring Provider: [place], JULIE Treating Provider/Extender: STONE III, Tameem Pullara Weeks in Treatment: 22 Constitutional Well-nourished and well-hydrated in no acute distress. Respiratory normal breathing without difficulty. Psychiatric this patient is able to make decisions and demonstrates good insight into disease process. Alert and Oriented x 3. pleasant and cooperative. Notes His wounds currently showed signs of good granulation and epithelization at this point overall very pleased with how things seem to be progressing and the patient seems to be doing well. Electronic Signature(s) Signed: 02/26/2020 3:05:40 PM By: Lenda Kelp PA-C Entered By: Lenda Kelp on 02/26/2020 15:05:39 Mcclatchy, [name] ([id number]) -------------------------------------------------------------------------------- Physician Orders Details Patient Name: ETHAN, KASPERSKI. Date of Service: 02/26/2020 2:30 PM Medical Record Number: [id number] Patient Account Number: [id number] Date of Birth/Sex: 1966/03/17 (54 y.o. F) Treating RN: Huel Coventry Primary Care Provider: [place], JULIE Other Clinician: Referring Provider: [place], JULIE Treating Provider/Extender: Linwood Dibbles, Sundeep Destin Weeks in Treatment: 23 Verbal / Phone Orders: No Diagnosis Coding ICD-10 Coding Code Description L89.313 Pressure ulcer of right buttock, stage 3 L89.323 Pressure  ulcer of left buttock, stage 3 L89.213 Pressure ulcer of right hip, stage 3 G82.52 Quadriplegia, C1-C4 incomplete E44.0 Moderate protein-calorie malnutrition Wound Cleansing Wound #5 Right Sacrum o Clean wound with Normal Saline. o May Shower, gently pat wound dry prior to applying new dressing. Wound #7 Right Trochanter o Clean wound with Normal Saline. o May Shower, gently pat wound dry prior to applying new dressing. Primary Wound Dressing Wound #5 Right Sacrum o Santyl Ointment - on wound bed with scell on top just with the necrotic tissue covered o Silver Alginate Wound #7 Right Trochanter o Silver Alginate Secondary Dressing Wound #5 Right Sacrum o ABD pad - Secure with tape or bordered foam dressing Wound #7 Right Trochanter o ABD pad - Secure with tape or bordered foam dressing Dressing Change Frequency Wound #5 Right Sacrum o Change Dressing Monday, Wednesday, Friday - or three times weekly Wound #7 Right Trochanter o Change Dressing Monday, Wednesday, Friday - or three times weekly Follow-up Appointments Wound #5 Right Sacrum o Return Appointment in 2 weeks. Wound #7 Right Trochanter o Return Appointment in 2 weeks. Off-Loading Wound #5 Right Sacrum o Mattress - air mattress o Turn and reposition every 2 hours Wound #7 Right Trochanter o Mattress - air mattress Kolander, Geoffrey M. ([id number]) o Turn and reposition every 2 hours Additional Orders / Instructions Wound #5 Right Sacrum o Increase protein intake. o Increase protein intake. Wound #7 Right Trochanter o Increase protein intake. o Increase protein intake. Home Health Wound #5 Right Sacrum o Continue Home Health Visits - [facility] - Please provide dressing materials for patient o Home Health Nurse may visit PRN to address patientos wound care needs. o FACE TO FACE ENCOUNTER: MEDICARE and MEDICAID PATIENTS: I certify that this patient is under my care and  that I had a face-to-face encounter that meets the physician face-to-face encounter requirements with this patient on this date. The encounter with the patient was in whole or in part for the following MEDICAL CONDITION: (primary reason for Home Healthcare) MEDICAL NECESSITY: I certify, that based on my findings, NURSING services are a medically necessary home health service. HOME BOUND STATUS: I certify that my clinical findings support that this patient is homebound (i.e., Due to illness or injury, pt requires aid of supportive devices such as crutches, cane,  wheelchairs, walkers, the use of special transportation or the assistance of another person to leave their place of residence. There is a normal inability to leave the home and doing so requires considerable and taxing effort. Other absences are for medical reasons / religious services and are infrequent or of short duration when for other reasons). o If current dressing causes regression in wound condition, may D/C ordered dressing product/s and apply Normal Saline Moist Dressing daily until next Wound Healing Center / Other MD appointment. Notify Wound Healing Center of regression in wound condition at [phone]. o Please direct any NON-WOUND related issues/requests for orders to patient's Primary Care Physician Wound #7 Right Trochanter o Continue Home Health Visits - [facility] - Please provide dressing materials for patient o Home Health Nurse may visit PRN to address patientos wound care needs. o FACE TO FACE ENCOUNTER: MEDICARE and MEDICAID PATIENTS: I certify that this patient is under my care and that I had a face-to-face encounter that meets the physician face-to-face encounter requirements with this patient on this date. The encounter with the patient was in whole or in part for the following MEDICAL CONDITION: (primary reason for Home Healthcare) MEDICAL NECESSITY: I certify, that based on my findings, NURSING  services are a medically necessary home health service. HOME BOUND STATUS: I certify that my clinical findings support that this patient is homebound (i.e., Due to illness or injury, pt requires aid of supportive devices such as crutches, cane, wheelchairs, walkers, the use of special transportation or the assistance of another person to leave their place of residence. There is a normal inability to leave the home and doing so requires considerable and taxing effort. Other absences are for medical reasons / religious services and are infrequent or of short duration when for other reasons). o If current dressing causes regression in wound condition, may D/C ordered dressing product/s and apply Normal Saline Moist Dressing daily until next Wound Healing Center / Other MD appointment. Notify Wound Healing Center of regression in wound condition at [phone]. o Please direct any NON-WOUND related issues/requests for orders to patient's Primary Care Physician Electronic Signature(s) Signed: 02/26/2020 2:54:31 PM By: Lenda Kelp PA-C Entered By: Lenda Kelp on 02/26/2020 14:54:29 Madding, [name] ([id number]) -------------------------------------------------------------------------------- Problem List Details Patient Name: AZILEE, PIRRO. Date of Service: 02/26/2020 2:30 PM Medical Record Number: [id number] Patient Account Number: [id number] Date of Birth/Sex: 1966-06-27 (54 y.o. F) Treating RN: Huel Coventry Primary Care Provider: [place], JULIE Other Clinician: Referring Provider: [place], JULIE Treating Provider/Extender: Linwood Dibbles, Amiri Tritch Weeks in Treatment: 9 Active Problems ICD-10 Encounter Code Description Active Date MDM Diagnosis L89.313 Pressure ulcer of right buttock, stage 3 04/08/2019 No Yes L89.323 Pressure ulcer of left buttock, stage 3 04/08/2019 No Yes L89.213 Pressure ulcer of right hip, stage 3 04/08/2019 No Yes G82.52 Quadriplegia, C1-C4 incomplete 04/08/2019 No  Yes E44.0 Moderate protein-calorie malnutrition 04/08/2019 No Yes Inactive Problems Resolved Problems Electronic Signature(s) Signed: 02/26/2020 2:46:00 PM By: Lenda Kelp PA-C Entered By: Lenda Kelp on 02/26/2020 14:46:00 Tiemann, [name] ([id number]) -------------------------------------------------------------------------------- Progress Note Details Patient Name: [name] Date of Service: 02/26/2020 2:30 PM Medical Record Number: [id number] Patient Account Number: [id number] Date of Birth/Sex: 1965/09/06 (54 y.o. F) Treating RN: Huel Coventry Primary Care Provider: [place], JULIE Other Clinician: Referring Provider: [place], JULIE Treating Provider/Extender: Linwood Dibbles, Katherin Ramey Weeks in Treatment: 46 Subjective Chief Complaint Information obtained from Patient Bilateral gluteal and right hip pressure ulcers History of Present Illness (HPI) 54 year old patient who  was been an unreliable historian says she had completely healed in February and most recently her home health noticed that the wound on the right scapular area had reopened. She was in hospital about a month ago with pneumonia at that time a workup was done. We have no notes today but will workup the electronic medical record. on review of her records electronically I do not find any recent hospital MRI in the last 6 months. She had a CT scan of the chest in February 2018 which does not show any osseous lesions. she was admitted to the hospital between May 14 and May 17 for 3 days for mucus plugging and collapse of the left lung. at that time she had a decubitus ulcer of the right scapular region stage III. her history was noted to have quadriplegia C5-C7 incomplete, neurogenic bladder, nephrostomy catheter and emphysema. patient tells me that she has not smoked for last month and a half but as per her hospital records she was smoking in the middle of May. 01/12/17 patient scapular region appears to be doing fairly  well on evaluation today. She is having really no syndicate discomfort and has been tolerating the dressing changes well complication. Readmission: 04/08/2019 patient presents today for reevaluation in our clinic concerning issues she is having with wounds on the bilateral gluteal area as well as on the right trochanter. She has unfortunately noted that things seem to have gotten worse as she was using the Cl[place]ron bed. The patient states it gives off a lot of heat and subsequently has not been good for her skin causing her to breakdown more. She has been trying to get back just to the air mattress but has not been able to get the company to come pick up that bed and bring out the air mattress. Her primary care provider is on this as well trying to get them to do so. Unfortunately her wounds are significantly large and show signs of hyper granulation as well. She has had this kind of issue for quite some time and unfortunately just does not seem to be showing signs of significant improvement with what she has been using currently. No fevers, chills, nausea, vomiting, or diarrhea. She does attempt appropriate offloading and again has been using the bed because that is all she has although she is not happy with it. 04/22/2019 on evaluation today patient appears to be doing about the same with regard to her wounds at this time. Fortunately there does not appear to be any signs of active infection at this point which is good news. She has been tolerating the dressing changes without complication. Overall I am pleased with that with that being said unfortunately she does have a little bit of more significant breakdown with regard to the wound bed upon evaluation today. I believe this is secondary to the fact that she has been lying potentially too much in a single position for too long over the past several weeks. I think that potentially if she were to mitigate this to a degree that things may improve  somewhat. With that being said she is really not happy with her current bed she is attempted to have them come out and switch this back to an air mattress but unfortunately has not been successful in getting them to come out and make this change. She is very frustrated with that. She still has the Clinitron bed and she much prefers just having the standard air mattress. Patient's primary care provider  still working on this she tells me. 05/08/2019 on evaluation today patient unfortunately is not doing as well as I would like to see in regard to her wounds. She actually is measuring a little bit larger pretty much at all wound locations unfortunately and overall I am very upset as well with the fact that she has been trying to get the Clinitron bed removed from her home but is not having any luck as such. We did get the number for the company today in order for [place] to see about calling them and switching this over to an air mattress as this is giving her a lot of trouble she states is really hot and she feels like it is breaking down her skin. This is not good. 05/22/2019 on evaluation today patient appears to be doing well with regard to the wounds she is showing some signs of slight improvement at this point. This is good news. Fortunately there is no evidence of active infection at this time which is also good news. No fevers, chills, nausea, vomiting, or diarrhea. She does tell me that in order to get rid of the Clinitron bed that she has been working on that the representative from the company stated they needed in order for me asked to be getting rid of it and then what we wanted her to have. Again I will be more than happy to do that we can initiate an air mattress for her. 06/06/2019 on evaluation today patient continues to have issues with her wounds. The good news is we figured out what to be done for the her bad as far as get not moved however she is getting actually ready to move and change  residences. For that reason what they are going to do is come in and take the bed from the current residence to get rid of the Clinitron bed and then subsequently move the new bed into her new place of residence. This makes complete sense to me as well and I think that sounds like a good plan. At least we have a plan for what to do as far as getting things taken care of here. 06/24/2019 on evaluation today patient appears to be doing really about the same in some regards although a couple of the wounds are measuring a little bit larger and deeper compared to previous. With that being said I do think that unfortunately her wounds seem to be overall progressing in the wrong direction towards being worse not better. The patient is still having to use the Clinitron bed at this time I do think that she really needs to switch to just a standard air mattress that is what she wants but she is having to wait until she moves in 2 weeks before she can get the new mattress so she does not have to pay to have the bed moved twice. 07/18/2019 on evaluation today patient appears to actually be doing somewhat better in regard to her wounds in general. I am very pleased in this regard. With that being said she still has some hyper granulation unfortunately the Hydrofera Blue just was getting stuck to her and the silver cell has done better. Nonetheless I do believe that we do need to probably treat this with some silver nitrate to try to help with the hypergranulation I discussed this with the patient today. She is in agreement with this plan. JENTRY, MCQUEARY ([id number]) 07/29/2019 on evaluation today patient seems to be making some progress albeit  slow regard to her wounds. Fortunately there is no signs of active infection at this time. No fever chills noted. She overall seems to be doing quite well at this point. 08/12/2019 upon evaluation today patient appears to be doing about the same at this point in regard to  her wounds. She still has some hyper granulation. I really feel like she would do better with Hydrofera Blue over the alginate itself. With that being said we have not been able to use that for a while as it was sticking but again it has been sometime she was having issues with a different bed at that time as well which may have contributed to some of the issues at that point. Fortunately there is no evidence of active infection at this time. No fevers, chills, nausea, vomiting, or diarrhea. 09/04/2019 upon evaluation today patient actually appears to be doing excellent in regard to her wounds across the board. I am extremely pleased with how things appear today. She has a lot of new epithelization, good granulation, and overall she states that she is doing very well with the current bed. Everything about today seems to be on the up and up. 09/25/2019 upon evaluation today patient appears to be doing excellent in regard to her wounds. She has been tolerating the dressing changes without complication. Fortunately there is no evidence of active infection at this time. No fevers, chills, nausea, vomiting, or diarrhea. Overall she is actually been making great progress since getting off of the air-fluidized mattress I am happy in that regard. 10/17/2019 upon evaluation today patient appears to be doing well at this point in regard to her wounds. She is making good progress at this time which is good news. Fortunately there is no signs of active infection at this time. No fevers, chills, nausea, vomiting, or diarrhea. 10/31/2019 upon evaluation today patient actually appears to be doing excellent in regard to her wounds currently. I feel like [facility] great job. Overall very pleased with the progress she is making. 11/21/2019; patient has wounds on her lower sacrum and right greater trochanter posteriorly. The wounds are epithelializing. No debridement was required. She has a level 3  surface for offloading. She tells us that she spends mostly all day in bed. She is a cervical level quadriplegia. 12/09/2019 upon evaluation today patient appears to be doing excellent in regard to her wounds. She has been tolerating the dressing changes without complication. Fortunately there does not appear to be any signs of active infection which is great news and overall I am extremely pleased. I think her wounds are getting closer to complete resolution. 12/26/2019 upon evaluation today patient appears to be doing okay regard to her wounds currently. Fortunately there is no signs of active infection at this time. No fever chills noted. She has been taking care of of the wounds decently well but unfortunately is having some issues currently with staying up a little bit too much I think this has led to the wounds not being quite as good as what we were hoping for but nonetheless I think that she is still doing fairly well. 01/09/2020 upon evaluation today patient appears to be doing a little bit worse today compared to normal. Fortunately there is been no signs of active infection at this time which is good news. Fortunately there is no fevers, chills, nausea, vomiting, or diarrhea. With that being said her wounds do appear to be doing worse as far as the overall  size is concerned that does have the somewhat concerned here as to be honest I think this is likely a direct result of too much pressure getting to the wounds at this time. I did have a note from the home health nurse asking about switching to a different dressing. That being said she is done so well with the [facility] I am somewhat reluctant to do so we have tried other dressings in the past without much good with success. With that being said I do believe that the patient is potentially in need of more appropriate and sustained offloading. 02/02/2020 on evaluation today patient appears to be doing a little bit more poorly in regard to her  wounds currently. There is some evidence of possible infection here. I am concerned in that regard about infection and I think that this is likely based on what I am seeing. She is concerned as well about the amount of odor and drainage that she has been experiencing. 02/26/2020 on evaluation today patient appears to be doing somewhat better in regard to her wounds which is good news. Fortunately there is no signs of active infection at this time. She has been using Santyl just over the hip flexor region and then alginate over everything else which has done a good job in my opinion. 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 For about 4-5 months she's had a decubitus ulcer on the right scapular region. she was lost to follow-up for the last 6 months. she had a couple of small areas which have opened out in the region of previous scar tissue in the sacral region but these haveall healed now. She does not have any other significant comorbidities. She has a good Roho cushion for her wheelchair and she has a specialized mattress for sleep. She has restarted smoking and smokes about half a pack of cigarettes a day 02/18/2015 -- X-ray of the pelvis -- IMPRESSION:No acute osseous injury of the pelvis. If there is clinical concern regarding osteomyelitis, recommend further evaluation with CT. X-ray of the sacrum and coccyx IMPRESSION:No acute osseous abnormality of the sacrum. If there is further clinical concern regarding osteomyelitis of the sacrum, further evaluation with CT is recommended. ========= Objective Constitutional Well-nourished and well-hydrated in no acute distress. JESSEL, GETTINGER. ([id number]) Vitals Time Taken: 2:25 AM, Height: 63 in, Weight: 70 lbs, BMI: 12.4, Temperature: 98.7 F, Pulse: 75 bpm, Respiratory Rate: 16 breaths/min, Blood Pressure: 124/63  mmHg. Respiratory normal breathing without difficulty. Psychiatric this patient is able to make decisions and demonstrates good insight into disease process. Alert and Oriented x 3. pleasant and cooperative. General Notes: His wounds currently showed signs of good granulation and epithelization at this point overall very pleased with how things seem to be progressing and the patient seems to be doing well. Integumentary (Hair, Skin) Wound #5 status is Open. Original cause of wound was Gradually Appeared. The wound is located on the Right Sacrum. The wound measures 10cm length x 12cm width x 1cm depth; 94.248cm^2 area and 94.248cm^3 volume. There is Fat Layer (Subcutaneous Tissue) exposed. There is a medium amount of serous drainage noted. The wound margin is flat and intact. There is large (67-100%) pink, hyper - granulation within the wound bed. There is a small (1-33%) amount of necrotic tissue within the wound bed including Adherent Slough. Wound #7 status is Open. Original cause of wound  was Gradually Appeared. The wound is located on the Right Trochanter. The wound measures 4.5cm length x 5cm width x 0.8cm depth; 17.671cm^2 area and 14.137cm^3 volume. There is Fat Layer (Subcutaneous Tissue) exposed. There is a medium amount of serous drainage noted. The wound margin is flat and intact. There is large (67-100%) red, hyper - granulation within the wound bed. There is a small (1-33%) amount of necrotic tissue within the wound bed including Adherent Slough. 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 May Shower, gently pat wound dry prior to applying new dressing. Wound #7 Right Trochanter: Clean wound with Normal Saline. May Shower,  gently pat wound dry prior to applying new dressing. Primary Wound Dressing: Wound #5 Right Sacrum: Santyl Ointment - on wound bed with scell on top just with the necrotic tissue covered Silver Alginate Wound #7 Right Trochanter: Silver Alginate Secondary Dressing: Wound #5 Right Sacrum: ABD pad - Secure with tape or bordered foam dressing Wound #7 Right Trochanter: ABD pad - Secure with tape or bordered foam dressing Dressing Change Frequency: Wound #5 Right Sacrum: Change Dressing Monday, Wednesday, Friday - or three times weekly Wound #7 Right Trochanter: Change Dressing Monday, Wednesday, Friday - or three times weekly Follow-up Appointments: Wound #5 Right Sacrum: Return Appointment in 2 weeks. Wound #7 Right Trochanter: Return Appointment in 2 weeks. Off-Loading: Wound #5 Right Sacrum: Mattress - air mattress Terrero, Mkenzie M. ([id number]) Turn and reposition every 2 hours Wound #7 Right Trochanter: Mattress - air mattress Turn and reposition every 2 hours Additional Orders / Instructions: Wound #5 Right Sacrum: Increase protein intake. Increase protein intake. Wound #7 Right Trochanter: Increase protein intake. Increase protein intake. Home Health: Wound #5 Right Sacrum: Continue Home Health Visits - [facility] - Please provide dressing materials for patient Home Health Nurse may visit PRN to address patient s wound care needs. FACE TO FACE ENCOUNTER: MEDICARE and MEDICAID PATIENTS: I certify that this patient is under my care and that I had a face-to-face encounter that meets the physician face-to-face encounter requirements with this patient on this date. The encounter with the patient was in whole or in part for the following MEDICAL CONDITION: (primary reason for Home Healthcare) MEDICAL NECESSITY: I certify, that based on my findings, NURSING services are a medically necessary home health service. HOME BOUND STATUS: I certify that my clinical findings support  that this patient is homebound (i.e., Due to illness or injury, pt requires aid of supportive devices such as crutches, cane, wheelchairs, walkers, the use of special transportation or the assistance of another person to leave their place of residence. There is a normal inability to leave the home and doing so requires considerable and taxing effort. Other absences are for medical reasons / religious services and are infrequent or of short duration when for other reasons). If current dressing causes regression in wound condition, may D/C ordered dressing product/s and apply Normal Saline Moist Dressing daily until next Wound Healing Center / Other MD appointment. Notify Wound Healing Center of regression in wound condition at [phone]. Please direct any NON-WOUND related issues/requests for orders to patient's Primary Care Physician Wound #7 Right Trochanter: Continue Home Health Visits - [facility] - Please provide dressing materials for patient Home Health Nurse may visit PRN to address patient s wound care  needs. FACE TO FACE ENCOUNTER: MEDICARE and MEDICAID PATIENTS: I certify that this patient is under my care and that I had a face-to-face encounter that meets the physician face-to-face encounter requirements with this patient on this date. The encounter with the patient was in whole or in part for the following MEDICAL CONDITION: (primary reason for Home Healthcare) MEDICAL NECESSITY: I certify, that based on my findings, NURSING services are a medically necessary home health service. HOME BOUND STATUS: I certify that my clinical findings support that this patient is homebound (i.e., Due to illness or injury, pt requires aid of supportive devices such as crutches, cane, wheelchairs, walkers, the use of special transportation or the assistance of another person to leave their place of residence. There is a normal inability to leave the home and doing so requires considerable and taxing  effort. Other absences are for medical reasons / religious services and are infrequent or of short duration when for other reasons). If current dressing causes regression in wound condition, may D/C ordered dressing product/s and apply Normal Saline Moist Dressing daily until next Wound Healing Center / Other MD appointment. Notify Wound Healing Center of regression in wound condition at [phone]. Please direct any NON-WOUND related issues/requests for orders to patient's Primary Care Physician 1. I would recommend at this point that we are going to continue with wound care measures as previous. I do think that the patient is doing extremely well all things considered. She does need to continue with appropriate offloading. We will use Santyl over the necrotic area right in the central portion of the sacral region. 2. We will have her continue with the silver alginate to cover the remainder of the wound areas and over the Santyl. 3. She does need to continue with appropriate offloading and use of the air mattress as well as frequent positioning and turning. We will see patient back for reevaluation in 2 weeks here in the clinic. If anything worsens or changes patient will contact our office for additional recommendations. Electronic Signature(s) Signed: 02/26/2020 3:05:57 PM By: Lenda Kelp PA-C Entered By: Lenda Kelp on 02/26/2020 15:05:56 Eskridge, [name] ([id number]) -------------------------------------------------------------------------------- SuperBill Details Patient Name: [name] Date of Service: 02/26/2020 Medical Record Number: [id number] Patient Account Number: [id number] Date of Birth/Sex: Apr 05, 1966 (54 y.o. F) Treating RN: Tyler Aas Primary Care Provider: [place], JULIE Other Clinician: Referring Provider: [place], JULIE Treating Provider/Extender: Linwood Dibbles, Laurelin Elson Weeks in Treatment: 46 Diagnosis Coding ICD-10 Codes Code Description L89.313  Pressure ulcer of right buttock, stage 3 L89.323 Pressure ulcer of left buttock, stage 3 L89.213 Pressure ulcer of right hip, stage 3 G82.52 Quadriplegia, C1-C4 incomplete E44.0 Moderate protein-calorie malnutrition Facility Procedures CPT4 Code: 83382505 Description: 99214 - WOUND CARE VISIT-LEV 4 EST PT Modifier: Quantity: 1 Physician Procedures CPT4 Code: [id number] Description: 99213 - WC PHYS LEVEL 3 - EST PT Modifier: Quantity: 1 CPT4 Code: Description: ICD-10 Diagnosis Description L89.313 Pressure ulcer of right buttock, stage 3 L89.323 Pressure ulcer of left buttock, stage 3 L89.213 Pressure ulcer of right hip, stage 3 G82.52 Quadriplegia, C1-C4 incomplete Modifier: Quantity: Electronic Signature(s) Signed: 02/26/2020 3:07:27 PM By: Lenda Kelp PA-C Entered By: Lenda Kelp on 02/26/2020 15:07:26

## 2020-02-27 NOTE — Progress Notes (Signed)
[name], [name] ([id number]) Visit Report for 02/26/2020 Arrival Information Details Patient Name: [name], [name] Date of Service: 02/26/2020 2:30 PM Medical Record Number: [id number] Patient Account Number: [id number] Date of Birth/Sex: Dec 08, 1965 (54 y.o. F) Treating RN: [name] Primary Care [name], [name] Other Clinician: Referring [name] Treating [name] Weeks in Treatment: 46 Visit Information History Since Last Visit All ordered tests and consults were completed: No Patient Arrived: Wheel Chair Added or deleted any medications: No Arrival Time: 14:38 Any new allergies or adverse reactions: No Accompanied By: friend Had a fall or experienced change in No Transfer Assistance: Manual activities of daily living that may affect Patient Identification Verified: Yes risk of falls: Secondary Verification Process Completed: Yes Signs or symptoms of abuse/neglect since last visito No Patient Requires Transmission-Based Precautions: No Hospitalized since last visit: No Patient Has Alerts: No Implantable device outside of the clinic excluding No cellular tissue based products placed in the center since last visit: Pain Present Now: No Electronic Signature(s) Signed: 02/26/2020 4:26:10 PM By: [name] Entered By: [name] on 02/26/2020 14:39:27 Howk, [name] Judie PetitM. ([id number]) -------------------------------------------------------------------------------- Clinic Level of Care Assessment Details Patient Name: [name], [name] Date of Service: 02/26/2020 2:30 PM Medical Record Number: [id number] Patient Account Number: [id number] Date of Birth/Sex: Dec 08, 1965 (54 y.o. F) Treating RN: [name] Primary Care Kinslee Dalpe: [place] [name], [name] Other Clinician: Referring [name] Treating Arra Connaughton/Extender: [name], [name] Weeks in Treatment: 46 Clinic Level of Care Assessment Items TOOL 4 Quantity  Score []  - Use when only an EandM is performed on FOLLOW-UP visit 0 ASSESSMENTS - Nursing Assessment / Reassessment X - Reassessment of Co-morbidities (includes updates in patient status) 1 10 X- 1 5 Reassessment of Adherence to Treatment Plan ASSESSMENTS - Wound and Skin Assessment / Reassessment []  - Simple Wound Assessment / Reassessment - one wound 0 X- 2 5 Complex Wound Assessment / Reassessment - multiple wounds []  - 0 Dermatologic / Skin Assessment (not related to wound area) ASSESSMENTS - Focused Assessment []  - Circumferential Edema Measurements - multi extremities 0 []  - 0 Nutritional Assessment / Counseling / Intervention []  - 0 Lower Extremity Assessment (monofilament, tuning fork, pulses) []  - 0 Peripheral Arterial Disease Assessment (using hand held doppler) ASSESSMENTS - Ostomy and/or Continence Assessment and Care X - Incontinence Assessment and Management 1 10 []  - 0 Ostomy Care Assessment and Management (repouching, etc.) PROCESS - Coordination of Care X - Simple Patient / Family Education for ongoing care 1 15 []  - 0 Complex (extensive) Patient / Family Education for ongoing care []  - 0 Staff obtains ChiropractorConsents, Records, Test Results / Process Orders []  - 0 Staff telephones HHA, Nursing Homes / Clarify orders / etc []  - 0 Routine Transfer to another Facility (non-emergent condition) []  - 0 Routine Hospital Admission (non-emergent condition) []  - 0 New Admissions / Manufacturing engineernsurance Authorizations / Ordering NPWT, Apligraf, etc. []  - 0 Emergency Hospital Admission (emergent condition) X- 1 10 Simple Discharge Coordination []  - 0 Complex (extensive) Discharge Coordination PROCESS - Special Needs []  - Pediatric / Minor Patient Management 0 []  - 0 Isolation Patient Management []  - 0 Hearing / Language / Visual special needs []  - 0 Assessment of Community assistance (transportation, D/C planning, etc.) []  - 0 Additional assistance / Altered mentation []  -  0 Support Surface(s) Assessment (bed, cushion, seat, etc.) INTERVENTIONS - Wound Cleansing / Measurement [name], [name] ([id number]) []  - 0 Simple Wound Cleansing - one wound X- 2 5 Complex  Wound Cleansing - multiple wounds X- 1 5 Wound Imaging (photographs - any number of wounds) []  - 0 Wound Tracing (instead of photographs) []  - 0 Simple Wound Measurement - one wound X- 2 5 Complex Wound Measurement - multiple wounds INTERVENTIONS - Wound Dressings []  - Small Wound Dressing one or multiple wounds 0 []  - 0 Medium Wound Dressing one or multiple wounds X- 2 20 Large Wound Dressing one or multiple wounds []  - 0 Application of Medications - topical []  - 0 Application of Medications - injection INTERVENTIONS - Miscellaneous []  - External ear exam 0 []  - 0 Specimen Collection (cultures, biopsies, blood, body fluids, etc.) []  - 0 Specimen(s) / Culture(s) sent or taken to Lab for analysis []  - 0 Patient Transfer (multiple staff / / Similar devices) []  - 0 Simple Staple / Suture removal (25 or less) []  - 0 Complex Staple / Suture removal (26 or more) []  - 0 Hypo / Hyperglycemic Management (close monitor of Blood Glucose) []  - 0 Ankle / Brachial Index (ABI) - do not check if billed separately X- 1 5 Vital Signs Has the patient been seen at the hospital within the last three years: Yes Total Score: 130 Level Of Care: New/Established - Level 4 Electronic Signature(s) Signed: 02/26/2020 4:54:16 PM By: Entered By: on 02/26/2020 14:59:44 [name], ( ) -------------------------------------------------------------------------------- Encounter Discharge Information Details Patient Name: [name], ROSEKRANS. Date of Service: 02/26/2020 2:30 PM Medical Record Number: Patient Account Number: Nurse, adult Date of Birth/Sex: [birth year] (54 y.o. F) Treating RN: Primary Care Paquita Printy: , [name]  Other Clinician: Referring Dottie Vaquerano: 02/28/2020, [name] Treating Mylei Brackeen/Extender: [name] Aas, [name] Weeks in Treatment: 71 Encounter Discharge Information Items Discharge Condition: Stable Ambulatory Status: Wheelchair Discharge Destination: Home Transportation: Private Auto Accompanied By: family Schedule Follow-up Appointment: Yes Clinical Summary of Care: Electronic Signature(s) Signed: 02/26/2020 4:54:16 PM By: [name] Entered By: [id number] on 02/26/2020 15:01:34 [place], 02/28/2020 ([id number]) -------------------------------------------------------------------------------- Lower Extremity Assessment Details Patient Name: [name], [name]. Date of Service: 02/26/2020 2:30 PM Medical Record Number: [id number] Patient Account Number: [name] Aas Date of Birth/Sex: May 27, 1966 (54 y.o. F) Treating RN: [name] Dibbles Primary Care Harman Langhans: 49, [name] Other Clinician: Referring Bitania Shankland: 02/28/2020, [name] Treating Truong Delcastillo/Extender: [name] Aas Weeks in Treatment: 46 Electronic Signature(s) Signed: 02/26/2020 4:26:10 PM By: 02/28/2020 Signed: 02/26/2020 5:40:22 PM By: [id number], BSN, RN, CWS, Kim RN, BSN Entered By: [name] on 02/26/2020 14:43:23 Toy[place], [id number] ([id number]) -------------------------------------------------------------------------------- Multi Wound Chart Details Patient Name: [name], [name]. Date of Service: 02/26/2020 2:30 PM Medical Record Number: [name] Coventry Patient Account Number: [place] Date of Birth/Sex: October 29, 1965 (54 y.o. F) Treating RN: 02/28/2020 Primary Care Davion Meara: [name] Dunks, [name] Other Clinician: Referring Arlo Buffone: 02/28/2020, [name] Treating Tabby Beaston/Extender: Elliot Gurney, [name] Weeks in Treatment: 46 Vital Signs Height(in): 63 Pulse(bpm): 75 Weight(lbs): 70 Blood Pressure(mmHg): 124/63 Body Mass Index(BMI): 12 Temperature(F): 98.7 Respiratory Rate(breaths/min): 16 Photos: [N/A:N/A] Wound Location: Right Sacrum  Right Trochanter N/A Wounding Event: Gradually Appeared Gradually Appeared N/A Primary Etiology: Pressure Ulcer Pressure Ulcer N/A Comorbid History: Asthma, Chronic Obstructive Asthma, Chronic Obstructive N/A Pulmonary Disease (COPD), History Pulmonary Disease (COPD), History of pressure wounds, Paraplegia of pressure wounds, Paraplegia Date Acquired: 10/16/2018 04/02/2018 N/A Weeks of Treatment: 46 46 N/A Wound Status: Open Open N/A Clustered Wound: Yes No N/A Clustered Quantity: 5 N/A N/A Measurements L x W x D (cm) 10x12x1 4.5x5x0.8 N/A Area (cm) : 94.248 17.671 N/A Volume (cm) : 94.248 14.137 N/A % Reduction  in Area: -100.00% -12.50% N/A % Reduction in Volume: -185.70% 0.00% N/A Classification: Category/Stage [name] Category/Stage [name] N/A Exudate Amount: Medium Medium N/A Exudate Type: Serous Serous N/A Exudate Color: amber amber N/A Wound Margin: Flat and Intact Flat and Intact N/A Granulation Amount: Large (67-100%) Large (67-100%) N/A Granulation Quality: Pink, Hyper-granulation Red, Hyper-granulation N/A Necrotic Amount: Small (1-33%) Small (1-33%) N/A Exposed Structures: Fat Layer (Subcutaneous Tissue): Fat Layer (Subcutaneous Tissue): N/A Yes Yes Fascia: No Fascia: No Tendon: No Tendon: No Muscle: No Muscle: No Joint: No Joint: No Bone: No Bone: No Epithelialization: Large (67-100%) Small (1-33%) N/A Treatment Notes Electronic Signature(s) Signed: 02/26/2020 4:54:16 PM By: [name] Aas Entered By: [name] Aas on 02/26/2020 14:47:51 Leavens, [name] ([id number]) Ahlers, [name] ([id number]) -------------------------------------------------------------------------------- Multi-Disciplinary Care Plan Details Patient Name: [name], [name]. Date of Service: 02/26/2020 2:30 PM Medical Record Number: [id number] Patient Account Number: [id number] Date of Birth/Sex: 06/14/1966 (54 y.o. F) Treating RN: [name] Aas Primary Care Tecumseh Yeagley: [place], [name]  Other Clinician: Referring Charles Niese: [place], [name] Treating Lounell Schumacher/Extender: [name] Dibbles, [name] Weeks in Treatment: 21 Active Inactive Abuse / Safety / Falls / Self Care Management Nursing Diagnoses: Impaired physical mobility Goals: Patient will not develop complications from immobility Date Initiated: 04/08/2019 Target Resolution Date: 07/12/2019 Goal Status: Active Interventions: Assess fall risk on admission and as needed Notes: Nutrition Nursing Diagnoses: Potential for alteratiion in Nutrition/Potential for imbalanced nutrition Goals: Patient/caregiver agrees to and verbalizes understanding of need to use nutritional supplements and/or vitamins as prescribed Date Initiated: 04/08/2019 Target Resolution Date: 07/12/2019 Goal Status: Active Interventions: Assess patient nutrition upon admission and as needed per policy Notes: Pressure Nursing Diagnoses: Knowledge deficit related to management of pressures ulcers Goals: Patient will remain free of pressure ulcers Date Initiated: 04/08/2019 Target Resolution Date: 07/12/2019 Goal Status: Active Interventions: Assess potential for pressure ulcer upon admission and as needed Notes: Wound/Skin Impairment Nursing Diagnoses: Impaired tissue integrity Goals: Ulcer/skin breakdown will heal within 14 weeks Date Initiated: 04/08/2019 Target Resolution Date: 07/12/2019 Goal Status: Active MARIAJOSE, MOW ([id number]) Interventions: Assess patient/caregiver ability to obtain necessary supplies Assess patient/caregiver ability to perform ulcer/skin care regimen upon admission and as needed Assess ulceration(s) every visit Notes: Electronic Signature(s) Signed: 02/26/2020 4:54:16 PM By: [name] Aas Entered By: [name] Aas on 02/26/2020 14:47:43 [place], [name] ([id number]) -------------------------------------------------------------------------------- Pain Assessment Details Patient Name: [name] Date of  Service: 02/26/2020 2:30 PM Medical Record Number: [id number] Patient Account Number: [id number] Date of Birth/Sex: [birth year] (54 y.o. F) Treating RN: [name] Coventry Primary Care Taylinn Brabant: [place], [name] Other Clinician: Referring Aldora Perman: [place], [name] Treating Knox Holdman/Extender: [name] Dibbles, [name] Weeks in Treatment: 65 Active Problems Location of Pain Severity and Description of Pain Patient Has Paino No Site Locations With Dressing Change: No Pain Management and Medication Current Pain Management: Electronic Signature(s) Signed: 02/26/2020 4:26:10 PM By: [name] Dunks Signed: 02/26/2020 5:40:22 PM By: Elliot Gurney, BSN, RN, CWS, Kim RN, BSN Entered By: [name] Dunks on 02/26/2020 14:40:06 Crum, [name] ([id number]) -------------------------------------------------------------------------------- Patient/Caregiver Education Details Patient Name: [name], [name]. Date of Service: 02/26/2020 2:30 PM Medical Record Number: [id number] Patient Account Number: [id number] Date of Birth/Gender: 07/13/1965 (54 y.o. F) Treating RN: [name] Aas Primary Care Physician: [place], [name] Other Clinician: Referring Physician: [place], [name] Treating Physician/Extender: Skeet Simmer in Treatment: 59 Education Assessment Education Provided To: Patient Education Topics Provided Wound/Skin Impairment: Handouts: Caring for Your Ulcer Methods: Explain/Verbal Responses: State content correctly Electronic Signature(s) Signed: 02/26/2020 4:54:16 PM By: [name] Aas Entered By: [name] Aas on 02/26/2020 15:00:04  [name], [name] ([id number]) -------------------------------------------------------------------------------- Wound Assessment Details Patient Name: [name], [name]. Date of Service: 02/26/2020 2:30 PM Medical Record Number: [id number] Patient Account Number: [id number] Date of Birth/Sex: Aug 26, 1965 (54 y.o. F) Treating RN: [name] Coventry Primary Care Arnice Vanepps: [place], [name]  Other Clinician: Referring Ashland Osmer: [place], [name] Treating Sayuri Rhames/Extender: STONE [name], [name] Weeks in Treatment: 46 Wound Status Wound Number: 5 Primary Pressure Ulcer Etiology: Wound Location: Right Sacrum Wound Open Wounding Event: Gradually Appeared Status: Date Acquired: 10/16/2018 Comorbid Asthma, Chronic Obstructive Pulmonary Disease (COPD), Weeks Of Treatment: 46 History: History of pressure wounds, Paraplegia Clustered Wound: Yes Photos Wound Measurements Length: (cm) 10 Width: (cm) 12 Depth: (cm) 1 Clustered Quantity: 5 Area: (cm) 94.248 Volume: (cm) 94.248 % Reduction in Area: -100% % Reduction in Volume: -185.7% Epithelialization: Large (67-100%) Wound Description Classification: Category/Stage [name] Wound Margin: Flat and Intact Exudate Amount: Medium Exudate Type: Serous Exudate Color: amber Foul Odor After Cleansing: No Slough/Fibrino Yes Wound Bed Granulation Amount: Large (67-100%) Exposed Structure Granulation Quality: Pink, Hyper-granulation Fascia Exposed: No Necrotic Amount: Small (1-33%) Fat Layer (Subcutaneous Tissue) Exposed: Yes Necrotic Quality: Adherent Slough Tendon Exposed: No Muscle Exposed: No Joint Exposed: No Bone Exposed: No Treatment Notes Wound #5 (Right Sacrum) Notes scell, santyl to necrotic tissue only on sacrum-scell on top, abd, tape, BFD [name], [name] ([id number]) Electronic Signature(s) Signed: 02/26/2020 4:26:10 PM By: [name] Dunks Signed: 02/26/2020 5:40:22 PM By: Elliot Gurney, BSN, RN, CWS, Kim RN, BSN Entered By: [name] Dunks on 02/26/2020 14:42:50 [name], [name] ([id number]) -------------------------------------------------------------------------------- Wound Assessment Details Patient Name: [name], ALTMAN. Date of Service: 02/26/2020 2:30 PM Medical Record Number: [id number] Patient Account Number: [id number] Date of Birth/Sex: 1965/08/23 (54 y.o. F) Treating RN: [name] Coventry Primary Care Ranger Petrich:  [place], [name] Other Clinician: Referring Ruvim Risko: [place], [name] Treating Pauleen Goleman/Extender: STONE [name], [name] Weeks in Treatment: 46 Wound Status Wound Number: 7 Primary Pressure Ulcer Etiology: Wound Location: Right Trochanter Wound Open Wounding Event: Gradually Appeared Status: Date Acquired: 04/02/2018 Comorbid Asthma, Chronic Obstructive Pulmonary Disease (COPD), Weeks Of Treatment: 46 History: History of pressure wounds, Paraplegia Clustered Wound: No Photos Wound Measurements Length: (cm) 4.5 Width: (cm) 5 Depth: (cm) 0.8 Area: (cm) 17.671 Volume: (cm) 14.137 % Reduction in Area: -12.5% % Reduction in Volume: 0% Epithelialization: Small (1-33%) Wound Description Classification: Category/Stage [name] Wound Margin: Flat and Intact Exudate Amount: Medium Exudate Type: Serous Exudate Color: amber Foul Odor After Cleansing: No Slough/Fibrino No Wound Bed Granulation Amount: Large (67-100%) Exposed Structure Granulation Quality: Red, Hyper-granulation Fascia Exposed: No Necrotic Amount: Small (1-33%) Fat Layer (Subcutaneous Tissue) Exposed: Yes Necrotic Quality: Adherent Slough Tendon Exposed: No Muscle Exposed: No Joint Exposed: No Bone Exposed: No Treatment Notes Wound #7 (Right Trochanter) Notes scell, santyl to necrotic tissue only on sacrum-scell on top, abd, tape, BFD Electronic Signature(s) Signed: 02/26/2020 4:26:10 PM By: Havery Moros ([id number]) Signed: 02/26/2020 5:40:22 PM By: Elliot Gurney, BSN, RN, CWS, Kim RN, BSN Entered By: [name] Dunks on 02/26/2020 14:43:12 Azimi, [name] ([id number]) -------------------------------------------------------------------------------- Vitals Details Patient Name: DENELL, COTHERN. Date of Service: 02/26/2020 2:30 PM Medical Record Number: [id number] Patient Account Number: [id number] Date of Birth/Sex: 1965/11/09 (53 y.o. F) Treating RN: [name] Coventry Primary Care Roniqua Kintz: [place], [name] Other  Clinician: Referring Macaulay Reicher: [place], [name] Treating Norlan Rann/Extender: STONE [name], [name] Weeks in Treatment: 46 Vital Signs Time Taken: 02:25 Temperature (F): 98.7 Height (in): 63 Pulse (bpm): 75 Weight (lbs): 70 Respiratory Rate (breaths/min): 16 Body Mass Index (BMI): 12.4 Blood Pressure (mmHg): 124/63 Reference Range: 80 - 120  mg / dl Electronic Signature(s) Signed: 02/26/2020 4:26:10 PM By: [name] Dunks Entered By: [name] Dunks on 02/26/2020 14:39:56

## 2020-03-11 ENCOUNTER — Other Ambulatory Visit: Payer: Self-pay

## 2020-03-11 ENCOUNTER — Encounter: Payer: Medicare Other | Attending: Physician Assistant | Admitting: Physician Assistant

## 2020-04-18 ENCOUNTER — Emergency Department: Payer: Medicare Other

## 2020-04-18 ENCOUNTER — Inpatient Hospital Stay
Admission: EM | Admit: 2020-04-18 | Discharge: 2020-05-03 | DRG: 388 | Disposition: E | Payer: Medicare Other | Attending: Internal Medicine | Admitting: Internal Medicine

## 2020-04-18 ENCOUNTER — Inpatient Hospital Stay: Payer: Medicare Other

## 2020-04-18 ENCOUNTER — Other Ambulatory Visit: Payer: Self-pay

## 2020-04-18 DIAGNOSIS — Z975 Presence of (intrauterine) contraceptive device: Secondary | ICD-10-CM

## 2020-04-18 DIAGNOSIS — E46 Unspecified protein-calorie malnutrition: Secondary | ICD-10-CM | POA: Diagnosis not present

## 2020-04-18 DIAGNOSIS — Z681 Body mass index (BMI) 19 or less, adult: Secondary | ICD-10-CM

## 2020-04-18 DIAGNOSIS — Z906 Acquired absence of other parts of urinary tract: Secondary | ICD-10-CM | POA: Diagnosis not present

## 2020-04-18 DIAGNOSIS — Z936 Other artificial openings of urinary tract status: Secondary | ICD-10-CM

## 2020-04-18 DIAGNOSIS — G8250 Quadriplegia, unspecified: Secondary | ICD-10-CM | POA: Diagnosis present

## 2020-04-18 DIAGNOSIS — R64 Cachexia: Secondary | ICD-10-CM | POA: Diagnosis present

## 2020-04-18 DIAGNOSIS — G8220 Paraplegia, unspecified: Secondary | ICD-10-CM | POA: Diagnosis not present

## 2020-04-18 DIAGNOSIS — Z978 Presence of other specified devices: Secondary | ICD-10-CM

## 2020-04-18 DIAGNOSIS — Z79899 Other long term (current) drug therapy: Secondary | ICD-10-CM | POA: Diagnosis not present

## 2020-04-18 DIAGNOSIS — E876 Hypokalemia: Secondary | ICD-10-CM | POA: Diagnosis present

## 2020-04-18 DIAGNOSIS — Z515 Encounter for palliative care: Secondary | ICD-10-CM

## 2020-04-18 DIAGNOSIS — R140 Abdominal distension (gaseous): Secondary | ICD-10-CM

## 2020-04-18 DIAGNOSIS — Z7401 Bed confinement status: Secondary | ICD-10-CM

## 2020-04-18 DIAGNOSIS — E43 Unspecified severe protein-calorie malnutrition: Secondary | ICD-10-CM | POA: Diagnosis present

## 2020-04-18 DIAGNOSIS — G9340 Encephalopathy, unspecified: Secondary | ICD-10-CM

## 2020-04-18 DIAGNOSIS — G9341 Metabolic encephalopathy: Secondary | ICD-10-CM | POA: Diagnosis not present

## 2020-04-18 DIAGNOSIS — S14105S Unspecified injury at C5 level of cervical spinal cord, sequela: Secondary | ICD-10-CM | POA: Diagnosis not present

## 2020-04-18 DIAGNOSIS — R4182 Altered mental status, unspecified: Secondary | ICD-10-CM | POA: Diagnosis not present

## 2020-04-18 DIAGNOSIS — Z20822 Contact with and (suspected) exposure to covid-19: Secondary | ICD-10-CM | POA: Diagnosis present

## 2020-04-18 DIAGNOSIS — Z7189 Other specified counseling: Secondary | ICD-10-CM

## 2020-04-18 DIAGNOSIS — L89109 Pressure ulcer of unspecified part of back, unspecified stage: Secondary | ICD-10-CM | POA: Diagnosis present

## 2020-04-18 DIAGNOSIS — E875 Hyperkalemia: Secondary | ICD-10-CM | POA: Diagnosis present

## 2020-04-18 DIAGNOSIS — L89122 Pressure ulcer of left upper back, stage 2: Secondary | ICD-10-CM | POA: Diagnosis present

## 2020-04-18 DIAGNOSIS — D72829 Elevated white blood cell count, unspecified: Secondary | ICD-10-CM

## 2020-04-18 DIAGNOSIS — N390 Urinary tract infection, site not specified: Secondary | ICD-10-CM | POA: Diagnosis present

## 2020-04-18 DIAGNOSIS — Z9889 Other specified postprocedural states: Secondary | ICD-10-CM | POA: Diagnosis not present

## 2020-04-18 DIAGNOSIS — Z91013 Allergy to seafood: Secondary | ICD-10-CM

## 2020-04-18 DIAGNOSIS — G009 Bacterial meningitis, unspecified: Secondary | ICD-10-CM | POA: Diagnosis not present

## 2020-04-18 DIAGNOSIS — E870 Hyperosmolality and hypernatremia: Secondary | ICD-10-CM | POA: Diagnosis not present

## 2020-04-18 DIAGNOSIS — L89102 Pressure ulcer of unspecified part of back, stage 2: Secondary | ICD-10-CM | POA: Diagnosis not present

## 2020-04-18 DIAGNOSIS — G928 Other toxic encephalopathy: Secondary | ICD-10-CM | POA: Diagnosis not present

## 2020-04-18 DIAGNOSIS — L89153 Pressure ulcer of sacral region, stage 3: Secondary | ICD-10-CM | POA: Diagnosis present

## 2020-04-18 DIAGNOSIS — I2699 Other pulmonary embolism without acute cor pulmonale: Secondary | ICD-10-CM | POA: Diagnosis not present

## 2020-04-18 DIAGNOSIS — K5651 Intestinal adhesions [bands], with partial obstruction: Secondary | ICD-10-CM | POA: Diagnosis present

## 2020-04-18 DIAGNOSIS — I82409 Acute embolism and thrombosis of unspecified deep veins of unspecified lower extremity: Secondary | ICD-10-CM | POA: Diagnosis not present

## 2020-04-18 DIAGNOSIS — K56609 Unspecified intestinal obstruction, unspecified as to partial versus complete obstruction: Secondary | ICD-10-CM | POA: Diagnosis not present

## 2020-04-18 DIAGNOSIS — I469 Cardiac arrest, cause unspecified: Secondary | ICD-10-CM | POA: Diagnosis not present

## 2020-04-18 DIAGNOSIS — L89213 Pressure ulcer of right hip, stage 3: Secondary | ICD-10-CM | POA: Diagnosis not present

## 2020-04-18 DIAGNOSIS — E878 Other disorders of electrolyte and fluid balance, not elsewhere classified: Secondary | ICD-10-CM | POA: Diagnosis not present

## 2020-04-18 DIAGNOSIS — J449 Chronic obstructive pulmonary disease, unspecified: Secondary | ICD-10-CM | POA: Diagnosis present

## 2020-04-18 DIAGNOSIS — E7800 Pure hypercholesterolemia, unspecified: Secondary | ICD-10-CM | POA: Diagnosis present

## 2020-04-18 DIAGNOSIS — K5641 Fecal impaction: Secondary | ICD-10-CM | POA: Diagnosis present

## 2020-04-18 DIAGNOSIS — R627 Adult failure to thrive: Secondary | ICD-10-CM | POA: Diagnosis present

## 2020-04-18 DIAGNOSIS — R109 Unspecified abdominal pain: Secondary | ICD-10-CM

## 2020-04-18 LAB — URINALYSIS, COMPLETE (UACMP) WITH MICROSCOPIC
Bilirubin Urine: NEGATIVE
Glucose, UA: NEGATIVE mg/dL
Ketones, ur: 5 mg/dL — AB
Nitrite: NEGATIVE
Protein, ur: 30 mg/dL — AB
Specific Gravity, Urine: 1.012 (ref 1.005–1.030)
pH: 6 (ref 5.0–8.0)

## 2020-04-18 LAB — COMPREHENSIVE METABOLIC PANEL
ALT: 8 U/L (ref 0–44)
AST: 13 U/L — ABNORMAL LOW (ref 15–41)
Albumin: 3.5 g/dL (ref 3.5–5.0)
Alkaline Phosphatase: 72 U/L (ref 38–126)
Anion gap: 15 (ref 5–15)
BUN: 34 mg/dL — ABNORMAL HIGH (ref 6–20)
CO2: 25 mmol/L (ref 22–32)
Calcium: 9.6 mg/dL (ref 8.9–10.3)
Chloride: 91 mmol/L — ABNORMAL LOW (ref 98–111)
Creatinine, Ser: 0.44 mg/dL (ref 0.44–1.00)
GFR, Estimated: >60 mL/min (ref >60)
Glucose, Bld: 121 mg/dL — ABNORMAL HIGH (ref 70–99)
Potassium: 5.3 mmol/L — ABNORMAL HIGH (ref 3.5–5.1)
Sodium: 131 mmol/L — ABNORMAL LOW (ref 135–145)
Total Bilirubin: 0.9 mg/dL (ref 0.3–1.2)
Total Protein: 8.6 g/dL — ABNORMAL HIGH (ref 6.5–8.1)

## 2020-04-18 LAB — CBC WITH DIFFERENTIAL/PLATELET
Abs Immature Granulocytes: 0.07 10*3/uL (ref 0.00–0.07)
Basophils Absolute: 0 10*3/uL (ref 0.0–0.1)
Basophils Relative: 0 %
Eosinophils Absolute: 0 10*3/uL (ref 0.0–0.5)
Eosinophils Relative: 0 %
HCT: 41.2 % (ref 36.0–46.0)
Hemoglobin: 13.2 g/dL (ref 12.0–15.0)
Immature Granulocytes: 1 %
Lymphocytes Relative: 15 %
Lymphs Abs: 0.8 10*3/uL (ref 0.7–4.0)
MCH: 27.6 pg (ref 26.0–34.0)
MCHC: 32 g/dL (ref 30.0–36.0)
MCV: 86.2 fL (ref 80.0–100.0)
Monocytes Absolute: 0.6 10*3/uL (ref 0.1–1.0)
Monocytes Relative: 11 %
Neutro Abs: 4.1 10*3/uL (ref 1.7–7.7)
Neutrophils Relative %: 73 %
Platelets: 477 10*3/uL — ABNORMAL HIGH (ref 150–400)
RBC: 4.78 MIL/uL (ref 3.87–5.11)
RDW: 14.7 % (ref 11.5–15.5)
WBC: 5.6 10*3/uL (ref 4.0–10.5)
nRBC: 0 % (ref 0.0–0.2)

## 2020-04-18 LAB — RESPIRATORY PANEL BY RT PCR (FLU A&B, COVID)
Influenza A by PCR: NEGATIVE
Influenza B by PCR: NEGATIVE
SARS Coronavirus 2 by RT PCR: NEGATIVE

## 2020-04-18 LAB — LACTIC ACID, PLASMA: Lactic Acid, Venous: 0.6 mmol/L (ref 0.5–1.9)

## 2020-04-18 LAB — LIPASE, BLOOD: Lipase: 32 U/L (ref 11–51)

## 2020-04-18 MED ORDER — MORPHINE SULFATE (PF) 4 MG/ML IV SOLN
4.0000 mg | Freq: Once | INTRAVENOUS | Status: AC
  Administered 2020-04-18: 4 mg via INTRAVENOUS
  Filled 2020-04-18: qty 1

## 2020-04-18 MED ORDER — ONDANSETRON HCL 4 MG/2ML IJ SOLN
4.0000 mg | Freq: Once | INTRAMUSCULAR | Status: AC
  Administered 2020-04-18: 4 mg via INTRAVENOUS
  Filled 2020-04-18: qty 2

## 2020-04-18 MED ORDER — IOHEXOL 300 MG/ML  SOLN
100.0000 mL | Freq: Once | INTRAMUSCULAR | Status: AC | PRN
  Administered 2020-04-18: 80 mL via INTRAVENOUS

## 2020-04-18 MED ORDER — PHENOL 1.4 % MT LIQD
1.0000 | OROMUCOSAL | Status: DC | PRN
  Filled 2020-04-18: qty 177

## 2020-04-18 MED ORDER — FLUTICASONE PROPIONATE 50 MCG/ACT NA SUSP
2.0000 | Freq: Every day | NASAL | Status: DC
  Administered 2020-04-22 – 2020-04-27 (×6): 2 via NASAL
  Filled 2020-04-18: qty 16

## 2020-04-18 MED ORDER — ENOXAPARIN SODIUM 40 MG/0.4ML ~~LOC~~ SOLN
40.0000 mg | SUBCUTANEOUS | Status: DC
  Administered 2020-04-18 – 2020-04-28 (×11): 40 mg via SUBCUTANEOUS
  Filled 2020-04-18 (×11): qty 0.4

## 2020-04-18 MED ORDER — ONDANSETRON HCL 4 MG PO TABS: 4.0000 mg | ORAL_TABLET | Freq: Four times a day (QID) | ORAL | Status: DC | PRN

## 2020-04-18 MED ORDER — LACTATED RINGERS IV BOLUS
500.0000 mL | Freq: Once | INTRAVENOUS | Status: AC
  Administered 2020-04-18: 500 mL via INTRAVENOUS

## 2020-04-18 MED ORDER — TIOTROPIUM BROMIDE MONOHYDRATE 18 MCG IN CAPS
18.0000 ug | ORAL_CAPSULE | Freq: Every day | RESPIRATORY_TRACT | Status: DC
  Administered 2020-04-19 – 2020-04-28 (×5): 18 ug via RESPIRATORY_TRACT
  Filled 2020-04-18 (×2): qty 5

## 2020-04-18 MED ORDER — INFLUENZA VAC SPLIT QUAD 0.5 ML IM SUSY
0.5000 mL | PREFILLED_SYRINGE | INTRAMUSCULAR | Status: DC
  Filled 2020-04-18: qty 0.5

## 2020-04-18 MED ORDER — ONDANSETRON HCL 4 MG/2ML IJ SOLN
4.0000 mg | Freq: Four times a day (QID) | INTRAMUSCULAR | Status: DC | PRN
  Administered 2020-04-19 – 2020-04-25 (×3): 4 mg via INTRAVENOUS
  Filled 2020-04-18 (×3): qty 2

## 2020-04-18 MED ORDER — SODIUM CHLORIDE 0.9 % IV SOLN: INTRAVENOUS | Status: DC

## 2020-04-18 MED ORDER — BECLOMETHASONE DIPROPIONATE 40 MCG/ACT IN AERS: 2.0000 | INHALATION_SPRAY | Freq: Two times a day (BID) | RESPIRATORY_TRACT | Status: DC

## 2020-04-18 MED ORDER — SODIUM CHLORIDE 0.9 % IV SOLN
1.0000 g | INTRAVENOUS | Status: DC
  Administered 2020-04-18 – 2020-04-20 (×3): 1 g via INTRAVENOUS
  Filled 2020-04-18 (×2): qty 1
  Filled 2020-04-18 (×2): qty 10

## 2020-04-18 MED ORDER — FLUTICASONE PROPIONATE HFA 44 MCG/ACT IN AERO
2.0000 | INHALATION_SPRAY | Freq: Two times a day (BID) | RESPIRATORY_TRACT | Status: DC
  Administered 2020-04-18 – 2020-04-28 (×15): 2 via RESPIRATORY_TRACT
  Filled 2020-04-18 (×2): qty 10.6

## 2020-04-18 MED ORDER — MORPHINE SULFATE (PF) 2 MG/ML IV SOLN
2.0000 mg | INTRAVENOUS | Status: DC | PRN
  Administered 2020-04-18 – 2020-04-20 (×4): 2 mg via INTRAVENOUS
  Filled 2020-04-18 (×5): qty 1

## 2020-04-18 NOTE — ED Notes (Signed)
Warm blankets provided to pt. No further needs expressed at this time.

## 2020-04-18 NOTE — ED Notes (Signed)
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 Stage 2 PI on back of shoulder. Stage 2 PI on back of thiegh.

## 2020-04-18 NOTE — ED Notes (Signed)
Pt resting in bed. Informed IV team has been consulted.

## 2020-04-18 NOTE — H&P (Signed)
History and Physical    [name] MastersCharlotte M Beckmann ZOX:[id number] DOB: 1965-09-30 DOA: March 09, 2020  PCP: [place], Julie A, MD   Patient coming from: Home  I have personally briefly reviewed [facility]  Chief Complaint: Nausea/vomiting  HPI: [name] MastersCharlotte M [name] is a 54 y.o. 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  She also complains of diffuse abdominal pain.  Patient's last bowel movement was about 4 days prior to her admission.  Patient states that her urine was dark and had a foul odor to it and so she was started on cefdinir by her primary care provider for possible UTI.  She complains of generalized weakness and poor oral intake but denies having any cough, no chest pain, no shortness of breath, no fever, no chills, no diaphoresis or palpitations. She has been unable to tolerate any oral intake in days. Labs show sodium 131, potassium 5.3, chloride 91, bicarb 25, BUN 34, creatinine 0.4, calcium 9.6, alkaline phosphatase 72, albumin 3.5, lipase 32, AST 13, ALT 8, total protein 8.6, lactic acid 0.6, white count 5.6, hemoglobin 13.2, hematocrit 41.2, MCV 86.2, RDW 14.7, platelet count 477 Respiratory viral panel is negative CT scan of abdomen and pelvis shows High-grade small bowel obstruction. The transition point is likely in the right lower abdomen a right pelvis with some decompressed loops in this region. There is pneumatosis associated with left upper quadrant loops of small bowel. No free air on today's study to suggest perforation.Patient Marked fecal loading in the rectum and distal sigmoid colon. Infiltrate in the right base worrisome for pneumonia. Decompression of the ascending and transverse colon. Poor visualization of the descending colon and proximal sigmoid colon. Left lower quadrant  urostomy.  No renal abnormalities are noted. Cholelithiasis.     ED Course: Patient is a 54 year old quadriplegic status post spinal cord injury who presents to the emergency room for evaluation of a 4-day history of abdominal pain, nausea, vomiting and constipation.  Imaging showed a high-grade small bowel obstruction.  Patient also has pyuria. She will be admitted to the hospital for further evaluation.  Review of Systems: As per HPI otherwise 10 point review of systems negative.    Past Medical History:  Diagnosis Date  . COPD (chronic obstructive pulmonary disease) (HCC)   . History of urostomy   . Paraplegic spinal paralysis [facility](HCC)     History reviewed. No pertinent surgical history.   reports that she has never smoked. She has never used smokeless tobacco. She reports that she does not drink alcohol. No history on file for drug use.  Allergies  Allergen Reactions  . Shellfish Allergy Shortness Of Breath    History reviewed. No pertinent family history.   Prior to Admission medications   Medication Sig Start Date End Date Taking? Authorizing Provider  Albuterol Sulfate 108 (90 Base) MCG/ACT AEPB Inhale 1 puff into the lungs every 4 (four) hours as needed.    [provider]  Ascorbic Acid (VITAMIN C) 500 MG CHEW Chew 500 mg by mouth daily. 12/31/17   [provider]  baclofen (LIORESAL) 10 MG tablet Take 1 tablet (10 mg total) by mouth 3 (three) times daily. 10/15/18   Altamese DillingVachhani, Vaibhavkumar, MD  beclomethasone (QVAR) 40 MCG/ACT inhaler Inhale 2 puffs into the lungs 2 (two) times daily.    [provider]  feeding supplement, ENSURE ENLIVE, (ENSURE ENLIVE) LIQD Take 237 mLs by mouth 3 (three) times daily between meals. 09/09/16   Enedina Finner, MD  fluticasone (FLONASE) 50 MCG/ACT nasal spray Place 2 sprays into both nostrils daily. 08/27/15   [provider]  gabapentin (NEURONTIN) 100 MG capsule Take 1 capsule (100 mg total) by mouth 3 (three)  times daily. 10/15/18   Altamese Dilling, MD  guaiFENesin (MUCINEX) 600 MG 12 hr tablet Take 1 tablet (600 mg total) by mouth 2 (two) times daily. 10/15/18   Altamese Dilling, MD  metoprolol tartrate (LOPRESSOR) 25 MG tablet Take 0.5 tablets (12.5 mg total) by mouth 2 (two) times daily. 10/15/18   Altamese Dilling, MD  mirtazapine (REMERON) 30 MG tablet Take 30 mg by mouth at bedtime. 05/24/16   [provider]  oxyCODONE-acetaminophen (PERCOCET) 10-325 MG tablet Take 1 tablet by mouth 3 (three) times daily. 09/03/18   [provider]  tiotropium (SPIRIVA HANDIHALER) 18 MCG inhalation capsule Place 18 mcg into inhaler and inhale daily. 08/18/16   [provider]    Physical Exam: Vitals:   April 27, 2020 0934 04/03/2020 1400  BP: (!) 136/94 109/75  Pulse: 83 92  Resp: 17 16  Temp: 98.5 F (36.9 C)   TempSrc: Oral   SpO2: 90% 94%     Vitals:   04/26/2020 0934 04/03/2020 1400  BP: (!) 136/94 109/75  Pulse: 83 92  Resp: 17 16  Temp: 98.5 F (36.9 C)   TempSrc: Oral   SpO2: 90% 94%    Constitutional: NAD, alert and oriented x 3.  Chronically ill-appearing Eyes: PERRL, lids and conjunctivae pallor ENMT: Mucous membranes are dry, NG tube in place Neck: normal, supple, no masses, no thyromegaly Respiratory: clear to auscultation bilaterally, no wheezing, no crackles. Normal respiratory effort. No accessory muscle use.  Cardiovascular: Regular rate and rhythm, no murmurs / rubs / gallops. No extremity edema. 2+ pedal pulses. No carotid bruits.  Abdomen: Diffuse tenderness, no masses palpated. No hepatosplenomegaly. Bowel sounds positive.  Urostomy Musculoskeletal: Contracted upper extremities, bilateral foot drop Skin: no rashes, lesions, ulcers.  Neurologic: Quadriplegic Psychiatric: Normal mood and affect.   Labs on Admission: I have personally reviewed following labs and imaging studies  CBC: Recent Labs  Lab 27-Apr-2020 1109  WBC 5.6  NEUTROABS  4.1  HGB 13.2  HCT 41.2  MCV 86.2  PLT 477*   Basic Metabolic Panel: Recent Labs  Lab Apr 27, 2020 1109  NA 131*  K 5.3*  CL 91*  CO2 25  GLUCOSE 121*  BUN 34*  CREATININE 0.44  CALCIUM 9.6   GFR: CrCl cannot be calculated (Unknown ideal weight.). Liver Function Tests: Recent Labs  Lab 04/02/2020 1109  AST 13*  ALT 8  ALKPHOS 72  BILITOT 0.9  PROT 8.6*  ALBUMIN 3.5   Recent Labs  Lab 04/21/2020 1109  LIPASE 32   No results for input(s): AMMONIA in the last 168 hours. Coagulation Profile: No results for input(s): INR, PROTIME in the last 168 hours. Cardiac Enzymes: No results for input(s): CKTOTAL, CKMB, CKMBINDEX, TROPONINI in the last 168 hours. BNP (last 3 results) No results for input(s): PROBNP in the last 8760 hours. HbA1C: No results for input(s): HGBA1C in the last 72 hours. CBG: No results for input(s): GLUCAP in the last 168 hours. Lipid Profile: No results for input(s): CHOL, HDL, LDLCALC, TRIG, CHOLHDL, LDLDIRECT in the last 72 hours. Thyroid Function Tests: No results for input(s): TSH, T4TOTAL, FREET4, T3FREE, THYROIDAB in the last 72  hours. Anemia Panel: No results for input(s): VITAMINB12, FOLATE, FERRITIN, TIBC, IRON, RETICCTPCT in the last 72 hours. Urine analysis:    Component Value Date/Time   COLORURINE AMBER (A) 04/19/2020 1019   APPEARANCEUR CLOUDY (A) 04/04/2020 1019   LABSPEC 1.012 04/23/2020 1019   PHURINE 6.0 04/12/2020 1019   GLUCOSEU NEGATIVE 04/03/2020 1019   HGBUR SMALL (A) 04/07/2020 1019   BILIRUBINUR NEGATIVE 04/12/2020 1019   KETONESUR 5 (A) 04/03/2020 1019   PROTEINUR 30 (A) 04/28/2020 1019   NITRITE NEGATIVE 04/15/2020 1019   LEUKOCYTESUR SMALL (A) 04/17/2020 1019    Radiological Exams on Admission: CT Abdomen Pelvis W Contrast  Result Date: 04/26/2020 CLINICAL DATA:  Abdominal pain. Nausea, vomiting, and weakness for 4 days. Patient is paraplegic. EXAM: CT ABDOMEN AND PELVIS WITH CONTRAST TECHNIQUE: Multidetector  CT imaging of the abdomen and pelvis was performed using the standard protocol following bolus administration of intravenous contrast. CONTRAST:  50mL OMNIPAQUE IOHEXOL 300 MG/ML  SOLN COMPARISON:  None. FINDINGS: Lower chest: Opacity in the right base as seen on series 4, image 1 is consistent with an infectious or inflammatory process. Probable atelectasis in the left base. No other abnormalities in the lung bases. Hepatobiliary: There is a cyst or hemangioma in the right hepatic lobe. The liver is otherwise normal. Cholelithiasis is identified without obvious wall thickening. The portal vein is patent. Pancreas: Unremarkable. No pancreatic ductal dilatation or surrounding inflammatory changes. Spleen: Normal in size without focal abnormality. Adrenals/Urinary Tract: Adrenal glands are normal. Small bilateral renal cysts are noted. No suspicious masses. No hydronephrosis. There is a punctate stone in the right kidney on coronal image 49 measuring 2 mm. Visualized portions of the ureters are normal. No ureterectasis or ureteral stones. The patient has a urostomy which is decompressed on this study and empties in the left lower quadrant. Stomach/Bowel: Stomach is unremarkable. The small bowel is dilated nearly along its entire length. There appear to be a few decompressed loops of small bowel in the right pelvis as seen on coronal image 30. There appears to be pneumatosis associated with some loops of small bowel in the left upper quadrant, well seen on coronal image 33. There is marked fecal loading in the rectum and distal sigmoid colon. The ascending colon and transverse colon are decompressed. The descending colon and proximal sigmoid colon are not well visualized. The appendix is not visualized but there is no secondary evidence of appendicitis. 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 The abdominal aorta is nonaneurysmal without significant atherosclerotic change. No gas seen in the venous system  despite the pneumatosis. No adenopathy. Reproductive: The patient has an IUD. There appears to be a dominant cyst in the region the right adnexa, simple in appearance measuring up to 2.7 cm. No abnormalities in the left adnexa. Other: There is some free fluid in the abdomen, likely secondary to the bowel obstruction. Musculoskeletal: No acute bony abnormalities identified. IMPRESSION: 1. High-grade small bowel obstruction. The transition point is likely in the right lower abdomen a right pelvis with some decompressed loops in this region. There is pneumatosis associated with left upper quadrant loops of small bowel. No free air on today's study to suggest perforation. 2. Marked fecal loading in the rectum and distal sigmoid colon. 3. Infiltrate in the right base worrisome for pneumonia. 4. Decompression of the ascending and transverse colon. Poor visualization of the descending colon and proximal sigmoid colon. 5. Left lower quadrant urostomy.  No renal abnormalities are noted. 6. Cholelithiasis. 7. Probable dominant  cyst/follicle in the right ovary, simple in appearance measuring 2.7 cm. No follow-up necessary. Findings called to Dr. Larinda Buttery. Electronically Signed   By: Gerome Sam III M.D   On: 04/24/2020 13:55   DG Abd Portable 1 View  Result Date: 04/28/2020 CLINICAL DATA:  NG tube placement EXAM: PORTABLE ABDOMEN - 1 VIEW COMPARISON:  11/21/2010 FINDINGS: NG tube coils in the fundus of the stomach. IMPRESSION: NG tube in the stomach. Electronically Signed   By: Charlett Nose M.D.   On: 04/15/2020 15:51    EKG: Independently reviewed.   Assessment/Plan Principal Problem:   SBO (small bowel obstruction) (HCC) Active Problems:   COPD (chronic obstructive pulmonary disease) (HCC)   History of urostomy   Paraplegic spinal paralysis (HCC)   Acute lower UTI   Sacral decubitus ulcer, stage III (HCC)   Pressure ulcer of back     Small bowel obstruction Patient presents to the ER for evaluation  of 4-day history of abdominal pain mostly diffuse associated with nausea, vomiting and constipation. Imaging shows high-grade small bowel obstruction as well as fecal impaction Gastric decompression with NG tube N.p.o. Supportive care with pain control, antiemetics, IV PPI and IV fluid hydration    Urinary tract infection Patient with pyuria We will treat patient empirically with Rocephin until urine culture results become available   COPD Continue inhaled steroids and Spiriva    Decubitus ulcers (POA) Patient has a stage III pressure injury to sacrum, stage II pressure injury in the back of shoulder and stage II pressure injury of the back of her thigh We will request wound care consult Turn patient frequently to prevent development of further pressure ulcers     DVT prophylaxis: Lovenox Code Status: Full code Family Communication: Greater than 50% of time was spent discussing patient's condition and plan of care with her at the bedside.  All questions and concerns have been addressed.  She verbalizes understanding and agrees with the plan. Disposition Plan: Back to previous home environment Consults called: Surgery    Paris Chiriboga MD [facility]     04/13/2020, 3:54 PM

## 2020-04-18 NOTE — ED Triage Notes (Signed)
Per [facility], called to pt's home d/t N/V/weakness X4 days since starting abx for possible UTI prescribed by PCP. Pt is paraplegic and B/L contracted arms. Pt has urostomy. Lives home with family. No fevers. Pt also c/o abd pain.

## 2020-04-18 NOTE — ED Notes (Signed)
Gave pt sips of water and gingerale.

## 2020-04-18 NOTE — ED Notes (Signed)
Pt states abdo pain 7/10. Pt started taking abx for UTI 4d ago and thinks this may be what caused vomiting. Pt is paralyzed "from neck down" but has some use of L arm, hand.

## 2020-04-18 NOTE — Plan of Care (Signed)
Continuing with plan of care. 

## 2020-04-18 NOTE — ED Notes (Signed)
Tried to call report to floor. Nurse busy in other pt room. Will call back.

## 2020-04-18 NOTE — ED Notes (Signed)
IV attempted once by this nurse. 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 Will consult to IV team.

## 2020-04-18 NOTE — ED Notes (Signed)
Pt has implanted Baclofen pump in RLQ ofabdomen. Pt has urostomy with drain, LLQ. Urine sample collected.

## 2020-04-18 NOTE — ED Notes (Signed)
Manual disimpaction performed by provider. Sacral dressing changed.

## 2020-04-18 NOTE — Consult Note (Signed)
Date of Consultation:  04/05/2020  Requesting Physician: Chesley Noon, MD  Reason for Consultation: Small bowel obstruction  History of Present Illness: [name] is a 54 y.o. female presented to emergency room with a 4-day history of abdominal pain associated with nausea and vomiting.  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  Patient reports that 4 days ago she started having abdominal pain that has progressed over the course of the days.  Then 1 day later started having issues with nausea and vomiting.  She has not been able to tolerate much p.o. intake.  She has not had a bowel movement in about 4 days as well but reports that usually she has to do enema about every 4 to 5 days for a bowel movement.  In the emergency room she was found to have white blood cell count of 5.6 with a potassium of 5.3, sodium 131, chloride 91, CO2 25, BUN 34, and creatinine 0.44.  Her lactic acid was normal at 0.6.  She had a CT scan of the abdomen pelvis which showed a small bowel obstruction with transition point in the distal ileum with an area in the left upper quadrant concerning for pneumatosis as well as stool impaction in the rectum.  Initially the patient had a dose of pain medication and nausea medication and after a few hours she reports that she still remains pain-free.  Denies any further nausea.  Denies any fevers, chills, chest pain, shortness of breath.  Past Medical History: Past Medical History:  Diagnosis Date  . COPD (chronic obstructive pulmonary disease) (HCC)   . History of urostomy   . Paraplegic spinal paralysis [facility])      Past Surgical History: -History of urostomy -history of baclofen pump placement  Home Medications: Prior to Admission medications   Medication Sig Start Date End Date Taking? Authorizing Provider  Albuterol Sulfate 108 (90 Base) MCG/ACT AEPB Inhale 1  puff into the lungs every 4 (four) hours as needed.    [provider]  Ascorbic Acid (VITAMIN C) 500 MG CHEW Chew 500 mg by mouth daily. 12/31/17   [provider]  baclofen (LIORESAL) 10 MG tablet Take 1 tablet (10 mg total) by mouth 3 (three) times daily. 10/15/18   Altamese Dilling, MD  beclomethasone (QVAR) 40 MCG/ACT inhaler Inhale 2 puffs into the lungs 2 (two) times daily.    [provider]  feeding supplement, ENSURE ENLIVE, (ENSURE ENLIVE) LIQD Take 237 mLs by mouth 3 (three) times daily between meals. 09/09/16   Enedina Finner, MD  fluticasone (FLONASE) 50 MCG/ACT nasal spray Place 2 sprays into both nostrils daily. 08/27/15   [provider]  gabapentin (NEURONTIN) 100 MG capsule Take 1 capsule (100 mg total) by mouth 3 (three) times daily. 10/15/18   Altamese Dilling, MD  guaiFENesin (MUCINEX) 600 MG 12 hr tablet Take 1 tablet (600 mg total) by mouth 2 (two) times daily. 10/15/18   Altamese Dilling, MD  metoprolol tartrate (LOPRESSOR) 25 MG tablet Take 0.5 tablets (12.5 mg total) by mouth 2 (two) times daily. 10/15/18   Altamese Dilling, MD  mirtazapine (REMERON) 30 MG tablet Take 30 mg by mouth at bedtime. 05/24/16   [provider]  oxyCODONE-acetaminophen (PERCOCET) 10-325 MG tablet Take 1 tablet by mouth 3 (three) times daily. 09/03/18   [provider]  tiotropium (SPIRIVA HANDIHALER) 18 MCG inhalation capsule  Place 18 mcg into inhaler and inhale daily. 08/18/16   [provider]    Allergies: Allergies  Allergen Reactions  . Shellfish Allergy Shortness Of Breath    Social History:  reports that she has never smoked. She has never used smokeless tobacco. She reports that she does not drink alcohol. No history on file for drug use.   Family History: History reviewed. No pertinent family history.  Review of Systems: Review of Systems  Constitutional: Negative for chills and fever.  HENT: Negative  for hearing loss.   Respiratory: Negative for shortness of breath.   Cardiovascular: Negative for chest pain.  Gastrointestinal: Positive for abdominal pain, constipation, nausea and vomiting. Negative for diarrhea.  Genitourinary: Negative for dysuria.  Musculoskeletal: Negative for myalgias.  Skin: Negative for rash.  Neurological: Negative for dizziness.  Psychiatric/Behavioral: Negative for depression.    Physical Exam BP 109/75 (BP Location: Right Arm)   Pulse 92   Temp 98.5 F (36.9 C) (Oral)   Resp 16   SpO2 94%  CONSTITUTIONAL: No acute distress HEENT:  Normocephalic, atraumatic, extraocular motion intact. NECK: Trachea is midline, and there is no jugular venous distension. RESPIRATORY:  Normal respiratory effort without pathologic use of accessory muscles. CARDIOVASCULAR: Regular rhythm and rate. GI: The abdomen is soft, very distended, with no tenderness to palpation.  Patient has on the right lower quadrant subcutaneous baclofen pump.  In the left lower quadrant patient has a patent urostomy with appliance connected.  Specifically, there is no pain in the left upper quadrant. MUSCULOSKELETAL:  Normal muscle strength and tone in all four extremities.  No peripheral edema or cyanosis. SKIN: Skin turgor is normal. There are no pathologic skin lesions.  NEUROLOGIC: Patient is quadriplegic but does have some motion of the left hand and arm. PSYCH:  Alert and oriented to person, place and time. Affect is normal.  Laboratory Analysis: Results for orders placed or performed during the hospital encounter of 04/06/2020 (from the past 24 hour(s))  Urinalysis, Complete w Microscopic Urine, Suprapubic     Status: Abnormal   Collection Time: 04/11/2020 10:19 AM  Result Value Ref Range   Color, Urine AMBER (A) YELLOW   APPearance CLOUDY (A) CLEAR   Specific Gravity, Urine 1.012 1.005 - 1.030   pH 6.0 5.0 - 8.0   Glucose, UA NEGATIVE NEGATIVE mg/dL   Hgb urine dipstick SMALL (A)  NEGATIVE   Bilirubin Urine NEGATIVE NEGATIVE   Ketones, ur 5 (A) NEGATIVE mg/dL   Protein, ur 30 (A) NEGATIVE mg/dL   Nitrite NEGATIVE NEGATIVE   Leukocytes,Ua SMALL (A) NEGATIVE   RBC / HPF 6-10 0 - 5 RBC/hpf   WBC, UA 21-50 0 - 5 WBC/hpf   Bacteria, UA MANY (A) NONE SEEN   Squamous Epithelial / LPF 0-5 0 - 5   Mucus PRESENT   Comprehensive metabolic panel     Status: Abnormal   Collection Time: 04/15/2020 11:09 AM  Result Value Ref Range   Sodium 131 (L) 135 - 145 mmol/L   Potassium 5.3 (H) 3.5 - 5.1 mmol/L   Chloride 91 (L) 98 - 111 mmol/L   CO2 25 22 - 32 mmol/L   Glucose, Bld 121 (H) 70 - 99 mg/dL   BUN 34 (H) 6 - 20 mg/dL   Creatinine, Ser 2.87 0.44 - 1.00 mg/dL   Calcium 9.6 8.9 - 68.1 mg/dL   Total Protein 8.6 (H) 6.5 - 8.1 g/dL   Albumin 3.5 3.5 - 5.0 g/dL   AST  13 (L) 15 - 41 U/L   ALT 8 0 - 44 U/L   Alkaline Phosphatase 72 38 - 126 U/L   Total Bilirubin 0.9 0.3 - 1.2 mg/dL   GFR, Estimated >16>60 >10>60 mL/min   Anion gap 15 5 - 15  CBC with Differential     Status: Abnormal   Collection Time: Nov 21, 2019 11:09 AM  Result Value Ref Range   WBC 5.6 4.0 - 10.5 K/uL   RBC 4.78 3.87 - 5.11 MIL/uL   Hemoglobin 13.2 12.0 - 15.0 g/dL   HCT 96.041.2 36 - 46 %   MCV 86.2 80.0 - 100.0 fL   MCH 27.6 26.0 - 34.0 pg   MCHC 32.0 30.0 - 36.0 g/dL   RDW 45.414.7 09.811.5 - 11.915.5 %   Platelets 477 (H) 150 - 400 K/uL   nRBC 0.0 0.0 - 0.2 %   Neutrophils Relative % 73 %   Neutro Abs 4.1 1.7 - 7.7 K/uL   Lymphocytes Relative 15 %   Lymphs Abs 0.8 0.7 - 4.0 K/uL   Monocytes Relative 11 %   Monocytes Absolute 0.6 0.1 - 1.0 K/uL   Eosinophils Relative 0 %   Eosinophils Absolute 0.0 0.0 - 0.5 K/uL   Basophils Relative 0 %   Basophils Absolute 0.0 0.0 - 0.1 K/uL   Immature Granulocytes 1 %   Abs Immature Granulocytes 0.07 0.00 - 0.07 K/uL  Lipase, blood     Status: None   Collection Time: Nov 21, 2019 11:09 AM  Result Value Ref Range   Lipase 32 11 - 51 U/L  Respiratory Panel by RT PCR (Flu A&B,  Covid) - Nasopharyngeal Swab     Status: None   Collection Time: Nov 21, 2019  1:57 PM   Specimen: Nasopharyngeal Swab  Result Value Ref Range   SARS Coronavirus 2 by RT PCR NEGATIVE NEGATIVE   Influenza A by PCR NEGATIVE NEGATIVE   Influenza B by PCR NEGATIVE NEGATIVE  Lactic acid, plasma     Status: None   Collection Time: Nov 21, 2019  2:08 PM  Result Value Ref Range   Lactic Acid, Venous 0.6 0.5 - 1.9 mmol/L    Imaging: CT Abdomen Pelvis W Contrast  Result Date: 2020/05/13 CLINICAL DATA:  Abdominal pain. Nausea, vomiting, and weakness for 4 days. Patient is paraplegic. EXAM: CT ABDOMEN AND PELVIS WITH CONTRAST TECHNIQUE: Multidetector CT imaging of the abdomen and pelvis was performed using the standard protocol following bolus administration of intravenous contrast. CONTRAST:  80mL OMNIPAQUE IOHEXOL 300 MG/ML  SOLN COMPARISON:  None. FINDINGS: Lower chest: Opacity in the right base as seen on series 4, image 1 is consistent with an infectious or inflammatory process. Probable atelectasis in the left base. No other abnormalities in the lung bases. Hepatobiliary: There is a cyst or hemangioma in the right hepatic lobe. The liver is otherwise normal. Cholelithiasis is identified without obvious wall thickening. The portal vein is patent. Pancreas: Unremarkable. No pancreatic ductal dilatation or surrounding inflammatory changes. Spleen: Normal in size without focal abnormality. Adrenals/Urinary Tract: Adrenal glands are normal. Small bilateral renal cysts are noted. No suspicious masses. No hydronephrosis. There is a punctate stone in the right kidney on coronal image 49 measuring 2 mm. Visualized portions of the ureters are normal. No ureterectasis or ureteral stones. The patient has a urostomy which is decompressed on this study and empties in the left lower quadrant. Stomach/Bowel: Stomach is unremarkable. The small bowel is dilated nearly along its entire length. There appear to be a few  decompressed  loops of small bowel in the right pelvis as seen on coronal image 30. There appears to be pneumatosis associated with some loops of small bowel in the left upper quadrant, well seen on coronal image 33. There is marked fecal loading in the rectum and distal sigmoid colon. The ascending colon and transverse colon are decompressed. The descending colon and proximal sigmoid colon are not well visualized. The appendix is not visualized but there is no secondary evidence of appendicitis. 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 The abdominal aorta is nonaneurysmal without significant atherosclerotic change. No gas seen in the venous system despite the pneumatosis. No adenopathy. Reproductive: The patient has an IUD. There appears to be a dominant cyst in the region the right adnexa, simple in appearance measuring up to 2.7 cm. No abnormalities in the left adnexa. Other: There is some free fluid in the abdomen, likely secondary to the bowel obstruction. Musculoskeletal: No acute bony abnormalities identified. IMPRESSION: 1. High-grade small bowel obstruction. The transition point is likely in the right lower abdomen a right pelvis with some decompressed loops in this region. There is pneumatosis associated with left upper quadrant loops of small bowel. No free air on today's study to suggest perforation. 2. Marked fecal loading in the rectum and distal sigmoid colon. 3. Infiltrate in the right base worrisome for pneumonia. 4. Decompression of the ascending and transverse colon. Poor visualization of the descending colon and proximal sigmoid colon. 5. Left lower quadrant urostomy.  No renal abnormalities are noted. 6. Cholelithiasis. 7. Probable dominant cyst/follicle in the right ovary, simple in appearance measuring 2.7 cm. No follow-up necessary. Findings called to Dr. Larinda Buttery. Electronically Signed   By: Gerome Sam III M.D   On: 04/02/2020 13:55   DG Abd Portable 1 View  Result Date:  04/17/2020 CLINICAL DATA:  NG tube placement EXAM: PORTABLE ABDOMEN - 1 VIEW COMPARISON:  11/21/2010 FINDINGS: NG tube coils in the fundus of the stomach. IMPRESSION: NG tube in the stomach. Electronically Signed   By: Charlett Nose M.D.   On: 04/30/2020 15:51    Assessment and Plan: This is a 54 y.o. female with small bowel obstruction.  -Patient currently is pain-free and specifically has no pain in her left upper quadrant where there was some concern for potential pneumatosis on the CT scan.  I have personally viewed the images and I do not think there is any significant evidence of ischemia in that area.  This potentially could be an over read versus artifact but at least the patient is asymptomatic and there is no urgent surgical needs from that perspective. -Discussed with the patient the management of small bowel obstructions particularly being conservative measures with n.p.o. diet, NG tube for decompression, and IV fluid hydration.  Discussed with her that we would try conservative measures for about 48 to 72 hours but if there is no improvement unfortunately she may require surgery.  However the majority of patients do improve with conservative measures. -We will continue to follow and will obtain a KUB tomorrow to assess her progression.  Face-to-face time spent with the patient and care providers was 80 minutes, with more than 50% of the time spent counseling, educating, and coordinating care of the patient.     Howie Ill, MD [facility]:  [phone]

## 2020-04-18 NOTE — ED Provider Notes (Signed)
[facility] Emergency Department Provider Note   ____________________________________________   First MD Initiated Contact with Patient May 10, 2020 0932     (approximate)  I have reviewed the triage vital signs and the nursing notes.   HISTORY  Chief Complaint Nausea, Emesis, and Weakness    HPI [name] is a 54 y.o. female with past medical history of quadriplegia status post spinal cord injury, COPD, and cystectomy with ileal conduit diversion who presents to the ED complaining of nausea and vomiting.  Patient states that she initially noticed dark urine with foul odor 4 days ago and was prescribed cefdinir by her PCP.  Over the past couple of days she has developed diffuse abdominal pain with nausea and vomiting.  She denies any fevers or flank pain but continues to note discolored urine.  She states she can often tell when she has a UTI but current symptoms feel different.  She has been feeling weaker than usual but denies any cough, chest pain, or shortness of breath.        Past Medical History:  Diagnosis Date  . COPD (chronic obstructive pulmonary disease) (HCC)   . History of urostomy   . Paraplegic spinal paralysis [facility])     Patient Active Problem List   Diagnosis Date Noted  . SBO (small bowel obstruction) (HCC) 05-10-2020  . COPD (chronic obstructive pulmonary disease) (HCC)   . History of urostomy   . Paraplegic spinal paralysis (HCC)   . Acute lower UTI   . Pneumonia 09/07/2016  . Pressure injury of skin 09/07/2016    History reviewed. No pertinent surgical history.  Prior to Admission medications   Medication Sig Start Date End Date Taking? Authorizing Provider  Albuterol Sulfate 108 (90 Base) MCG/ACT AEPB Inhale 1 puff into the lungs every 4 (four) hours as needed.    [provider]  Ascorbic Acid (VITAMIN C) 500 MG CHEW Chew 500 mg by mouth daily. 12/31/17   [provider]  baclofen (LIORESAL) 10 MG  tablet Take 1 tablet (10 mg total) by mouth 3 (three) times daily. 10/15/18   Altamese Dilling, MD  beclomethasone (QVAR) 40 MCG/ACT inhaler Inhale 2 puffs into the lungs 2 (two) times daily.    [provider]  feeding supplement, ENSURE ENLIVE, (ENSURE ENLIVE) LIQD Take 237 mLs by mouth 3 (three) times daily between meals. 09/09/16   Enedina Finner, MD  fluticasone (FLONASE) 50 MCG/ACT nasal spray Place 2 sprays into both nostrils daily. 08/27/15   [provider]  gabapentin (NEURONTIN) 100 MG capsule Take 1 capsule (100 mg total) by mouth 3 (three) times daily. 10/15/18   Altamese Dilling, MD  guaiFENesin (MUCINEX) 600 MG 12 hr tablet Take 1 tablet (600 mg total) by mouth 2 (two) times daily. 10/15/18   Altamese Dilling, MD  metoprolol tartrate (LOPRESSOR) 25 MG tablet Take 0.5 tablets (12.5 mg total) by mouth 2 (two) times daily. 10/15/18   Altamese Dilling, MD  mirtazapine (REMERON) 30 MG tablet Take 30 mg by mouth at bedtime. 05/24/16   [provider]  oxyCODONE-acetaminophen (PERCOCET) 10-325 MG tablet Take 1 tablet by mouth 3 (three) times daily. 09/03/18   [provider]  tiotropium (SPIRIVA HANDIHALER) 18 MCG inhalation capsule Place 18 mcg into inhaler and inhale daily. 08/18/16   [provider]    Allergies Shellfish allergy  History reviewed. No pertinent family history.  Social History Social History   Tobacco Use  . Smoking status: Never Smoker  .  Smokeless tobacco: Never Used  Substance Use Topics  . Alcohol use: No  . Drug use: Not on file    Review of Systems  Constitutional: No fever/chills.  Positive for generalized weakness. Eyes: No visual changes. ENT: No sore throat. Cardiovascular: Denies chest pain. Respiratory: Denies shortness of breath. Gastrointestinal: Positive for abdominal pain, nausea, and vomiting.  No diarrhea.  No constipation. Genitourinary: Negative for dysuria. Musculoskeletal:  Negative for back pain. Skin: Negative for rash. Neurological: Negative for headaches, focal weakness or numbness.  ____________________________________________   PHYSICAL EXAM:  VITAL SIGNS: ED Triage Vitals [05-02-2020 0934]  Enc Vitals Group     BP (!) 136/94     Pulse Rate 83     Resp 17     Temp 98.5 F (36.9 C)     Temp Source Oral     SpO2 90 %     Weight      Height      Head Circumference      Peak Flow      Pain Score 5     Pain Loc      Pain Edu?      Excl. in GC?     Constitutional: Alert and oriented.  Very thin and cachectic. Eyes: Conjunctivae are normal. Head: Atraumatic. Nose: No congestion/rhinnorhea. Mouth/Throat: Mucous membranes are moist. Neck: Normal ROM Cardiovascular: Normal rate, regular rhythm. Grossly normal heart sounds. Respiratory: Normal respiratory effort.  No retractions. Lungs CTAB. Gastrointestinal: Soft and diffusely tender to palpation with no rebound or guarding, mild distention noted.  Baclofen pump in place over right lower quadrant. Genitourinary: Ileal conduit diversion with cloudy urine in collecting bag. Musculoskeletal: No lower extremity tenderness nor edema. Neurologic:  Normal speech and language. No gross focal neurologic deficits are appreciated. Skin:  Skin is warm, dry and intact. No rash noted. Psychiatric: Mood and affect are normal. Speech and behavior are normal.  ____________________________________________   LABS (all labs ordered are listed, but only abnormal results are displayed)  Labs Reviewed  COMPREHENSIVE METABOLIC PANEL - Abnormal; Notable for the following components:      Result Value   Sodium 131 (*)    Potassium 5.3 (*)    Chloride 91 (*)    Glucose, Bld 121 (*)    BUN 34 (*)    Total Protein 8.6 (*)    AST 13 (*)    All other components within normal limits  CBC WITH DIFFERENTIAL/PLATELET - Abnormal; Notable for the following components:   Platelets 477 (*)    All other components  within normal limits  URINALYSIS, COMPLETE (UACMP) WITH MICROSCOPIC - Abnormal; Notable for the following components:   Color, Urine AMBER (*)    APPearance CLOUDY (*)    Hgb urine dipstick SMALL (*)    Ketones, ur 5 (*)    Protein, ur 30 (*)    Leukocytes,Ua SMALL (*)    Bacteria, UA MANY (*)    All other components within normal limits  RESPIRATORY PANEL BY RT PCR (FLU A&B, COVID)  URINE CULTURE  LIPASE, BLOOD  LACTIC ACID, PLASMA  HIV ANTIBODY (ROUTINE TESTING W REFLEX)     PROCEDURES  Procedure(s) performed (including Critical Care):  Ultrasound ED Peripheral IV (Provider)  Date/Time: May 02, 2020 11:12 AM Performed by: Chesley Noon, MD Authorized by: Chesley Noon, MD   Procedure details:    Indications: hydration and multiple failed IV attempts     Skin Prep: chlorhexidine gluconate     Location: Left basilic.  Angiocath:  20 G   Bedside Ultrasound Guided: Yes     Images: not archived     Patient tolerated procedure without complications: Yes     Dressing applied: Yes      ------------------------------------------------------------------------------------------------------------------- Fecal Disimpaction Procedure Note:  Performed by me:  Patient placed in the lateral recumbent position with knees drawn towards chest. Nurse present for patient support. Large amount of hard brown stool removed. No complications during procedure.   ------------------------------------------------------------------------------------------------------------------  ____________________________________________   INITIAL IMPRESSION / ASSESSMENT AND PLAN / ED COURSE       54 year old female with past medical history of quadriplegia status post spinal cord injury, COPD, and cystectomy with ileal conduit diversion who presents to the ED with increasing abdominal pain with nausea and vomiting over the past couple of days since being diagnosed with a UTI by her PCP.  She has  diffuse tenderness on exam along with cloudy appearance of her urine in urostomy collecting bag.  We will further assess with CT scan, check labs and UA.  She appears very thin and slightly dehydrated, will hydrate with IV fluids, treat with IV morphine and Zofran.  Lab work is mild hyperkalemia, which should improve with IV fluid bolus.  Renal function and other electrolytes are within normal limits.  CT scan was performed and consistent with high-grade bowel obstruction with questionable pneumatosis.  Case discussed with Dr. Aleen Campi from general surgery, who has evaluated the patient and feels that given normal lactate with much improved pain, bowel ischemia would be extremely unlikely.  They recommend placement of NG tube and case was discussed with hospitalist for admission.  Patient also noted to have significant stool in the rectum on CT, manual disimpaction was performed.  There is questionable pneumonia on CT scan but patient has no symptoms to suggest pneumonia.  We will continue her antibiotics for UTI during this admission, plan to treat with Rocephin as she is not currently tolerating p.o.      ____________________________________________   FINAL CLINICAL IMPRESSION(S) / ED DIAGNOSES  Final diagnoses:  SBO (small bowel obstruction) (HCC)  Urinary tract infection without hematuria, site unspecified     ED Discharge Orders    None       Note:  This document was prepared using Dragon voice recognition software and may include unintentional dictation errors.   Chesley Noon, MD 04/07/2020 [phone]

## 2020-04-19 ENCOUNTER — Inpatient Hospital Stay: Payer: Medicare Other

## 2020-04-19 DIAGNOSIS — E876 Hypokalemia: Secondary | ICD-10-CM

## 2020-04-19 DIAGNOSIS — L89213 Pressure ulcer of right hip, stage 3: Secondary | ICD-10-CM

## 2020-04-19 DIAGNOSIS — D72829 Elevated white blood cell count, unspecified: Secondary | ICD-10-CM | POA: Diagnosis not present

## 2020-04-19 DIAGNOSIS — G8220 Paraplegia, unspecified: Secondary | ICD-10-CM

## 2020-04-19 DIAGNOSIS — L89153 Pressure ulcer of sacral region, stage 3: Secondary | ICD-10-CM

## 2020-04-19 DIAGNOSIS — K56609 Unspecified intestinal obstruction, unspecified as to partial versus complete obstruction: Secondary | ICD-10-CM | POA: Diagnosis not present

## 2020-04-19 DIAGNOSIS — L89102 Pressure ulcer of unspecified part of back, stage 2: Secondary | ICD-10-CM

## 2020-04-19 LAB — CBC
HCT: 31.1 % — ABNORMAL LOW (ref 36.0–46.0)
Hemoglobin: 9.9 g/dL — ABNORMAL LOW (ref 12.0–15.0)
MCH: 28.4 pg (ref 26.0–34.0)
MCHC: 31.8 g/dL (ref 30.0–36.0)
MCV: 89.4 fL (ref 80.0–100.0)
Platelets: 407 10*3/uL — ABNORMAL HIGH (ref 150–400)
RBC: 3.48 MIL/uL — ABNORMAL LOW (ref 3.87–5.11)
RDW: 15.1 % (ref 11.5–15.5)
WBC: 17.9 10*3/uL — ABNORMAL HIGH (ref 4.0–10.5)
nRBC: 0 % (ref 0.0–0.2)

## 2020-04-19 LAB — URINE CULTURE

## 2020-04-19 LAB — BASIC METABOLIC PANEL
Anion gap: 9 (ref 5–15)
BUN: 20 mg/dL (ref 6–20)
CO2: 25 mmol/L (ref 22–32)
Calcium: 8.4 mg/dL — ABNORMAL LOW (ref 8.9–10.3)
Chloride: 102 mmol/L (ref 98–111)
Creatinine, Ser: <0.3 mg/dL — ABNORMAL LOW (ref 0.44–1.00)
Glucose, Bld: 71 mg/dL (ref 70–99)
Potassium: 3.3 mmol/L — ABNORMAL LOW (ref 3.5–5.1)
Sodium: 136 mmol/L (ref 135–145)

## 2020-04-19 LAB — HIV ANTIBODY (ROUTINE TESTING W REFLEX): HIV Screen 4th Generation wRfx: NONREACTIVE

## 2020-04-19 MED ORDER — POTASSIUM CHLORIDE IN NACL 20-0.9 MEQ/L-% IV SOLN
INTRAVENOUS | Status: DC
  Filled 2020-04-19 (×6): qty 1000

## 2020-04-19 NOTE — Consult Note (Addendum)
WOC Nurse Consult Note: Reason for Consult: Several healing pressure injuries. Patient is followed by [facility]. She has seen the outpatient wound care center PA [facility]) in the past, but not since August, 2021. Photos taken today by Dr. Renae Gloss are appreciated. Wound type: Pressure Pressure Injury POA: Yes Measurement: Left heel: 0.1cm x 0.2cm x 0.1cm nearly reepithelialized, pink Right heel: healed Right IT: Stage 3 with 2cm x 3.5cm x 0.1cm pink wound in the center of a 5cm x 4cm previous wound (now scar tissue); undermining measuring 0.6cm at distal margin. Sacrum: 6.5cm x 15cm x 0.3cm  65% healed, 35% scattered area of partial and full thickness tissue loss. Wound BTD:VVOH scar tissue (absent melanin) and scattered areas of full thickness wounds remain in both locations Drainage (amount, consistency, odor) small serous Periwound: intact, dry Dressing procedure/placement/frequency: I will provide the patient with a mattress replacement with low air loss feature, bilateral pressure redistribution heel boots and topical care orders for the sacral Stage 4 and right ischial tuberosity Stage 3 pressure injuries using silver hydrofiber as a wound contact layer topped with silicone foam. The bilateral heels have healed and will be treated with prophylactic silicone foam dressings prior to being placed in pressure redistribution heel boots. Patient is to be turned and repositioned from side to side and time in the supine position minimized.   WOC Nurse ostomy consult note Stoma type/location: LLQ urostomy. Pouch applied two days ago is intact. Stomal assessment/size: Not able to measure today, mucus obscures stoma (normal finding) Peristomal assessment: Not seen today Treatment options for stomal/peristomal skin: n/a Output: clear yellow urine Ostomy pouching: 1pc. 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  Patient is established with a supply provider.   WOC nursing team will not follow, but will remain available to this patient, the nursing and medical teams.  Please re-consult if needed. Thanks, Ladona Mow, MSN, RN, GNP, Leda Min, FAAN  Pager# [phone]  Pouching supplies ordered to bedside:  3 urostomy pouches, 1 adapter, skin barrier rings are kept as floor stock.

## 2020-04-19 NOTE — Progress Notes (Signed)
[facility] SURGICAL PROGRESS NOTE (cpt [phone])  Hospital Day(s): 1.   Interval History: Patient seen and examined, no acute events or new complaints overnight. Patient reports she overall feels improved compared to presentation however she is still having some discomfort intermittently mostly near her umbilicus. She remains distended but improved from presentation. She denies fever, chills, nausea, emesis. Marked increase in her leukocytosis this morning to 17.9K. Renal function normal with sCr - <0.30, and UO 1.4L. She has mild hypokalemia to 3.3 this morning. NGT output only recorded as 50 ccs in last 24 hours. She reports no significant flatus or stool. KUB remains concerning for SBO.   Review of Systems:  Constitutional: denies fever, chills  HEENT: denies cough or congestion  Respiratory: denies any shortness of breath  Cardiovascular: denies chest pain or palpitations  Gastrointestinal: + abdominal pain, + distension, denied N/V, or diarrhea/and bowel function as per interval history Genitourinary: denies burning with urination or urinary frequency  Vital signs in last 24 hours: [min-max] current  Temp:  [98.1 F (36.7 C)-98.7 F (37.1 C)] 98.3 F (36.8 C) (10/18 0355) Pulse Rate:  [83-103] 90 (10/18 0355) Resp:  [11-17] 16 (10/18 0355) BP: (103-136)/(65-94) 103/70 (10/18 0355) SpO2:  [90 %-99 %] 95 % (10/18 0355)             Intake/Output last 2 shifts:  10/17 0701 - 10/18 0700 In: 322.5 [I.V.:222.5; IV Piggyback:100] Out: 1440 [Urine:1390; Emesis/NG output:50]   Physical Exam:  Constitutional: alert, cooperative and no distress  HENT: normocephalic without obvious abnormality, NGT in palce Eyes: PERRL, EOM's grossly intact and symmetric  Respiratory: breathing non-labored at rest  Cardiovascular: regular rate and sinus rhythm  Gastrointestinal: Soft, periumbilical soreness, distended but improved, no rebound/guarding, urostomy in place in LLQ. Patient has  on the right lower quadrant subcutaneous baclofen pump Musculoskeletal: Chronic atrophy secondary to history of paraplegia   Labs:  CBC Latest Ref Rng & Units 04/19/2020 06-May-2020 10/13/2018  WBC 4.0 - 10.5 K/uL 17.9(H) 5.6 8.3  Hemoglobin 12.0 - 15.0 g/dL 5.3(I) 14.4 31.5  Hematocrit 36 - 46 % 31.1(L) 41.2 43.0  Platelets 150 - 400 K/uL 407(H) 477(H) 395   CMP Latest Ref Rng & Units 04/19/2020 May 06, 2020 10/13/2018  Glucose 70 - 99 mg/dL 71 400(Q) 82  BUN 6 - 20 mg/dL 20 67(Y) 12  Creatinine 0.44 - 1.00 mg/dL <1.95(K) 9.32 <6.71(I)  Sodium 135 - 145 mmol/L 136 131(L) 140  Potassium 3.5 - 5.1 mmol/L 3.3(L) 5.3(H) 5.1  Chloride 98 - 111 mmol/L 102 91(L) 104  CO2 22 - 32 mmol/L 25 25 24   Calcium 8.9 - 10.3 mg/dL ) 9.6 9.2  Total Protein 6.5 - 8.1 g/dL - 8.6(H) -  Total Bilirubin 0.3 - 1.2 mg/dL - 0.9 -  Alkaline Phos 38 - 126 U/L - 72 -  AST 15 - 41 U/L - 13(L) -  ALT 0 - 44 U/L - 8 -     Imaging studies:   KUB (04/19/2020) personally reviewed with persistently dilated loops of small bowel, no significant improvement from prior, and radiologist report reviewed:  IMPRESSION: Continued small bowel obstruction.   Assessment/Plan: (ICD-10's: K75.609) 54 y.o. female with persistent small bowel obstruction, most likely secondary to post-surgical adhesions, complicated by history of paraplegia.    - Continue with NGT decompression; LIS; monitor and record output  - Monitor abdominal examination; on-going bowel function  - Pain control prn (minimize narcotics if feasible); antiemetics prn  - Serial KUBs as needed  -  Monitor leukocytosis  - No emergent surgical intervention at this time; she understands that if she were to clinically deteriorate or fail conservative measures then she would warrant intervention sooner   - Further management per primary service; we will follow    All of the above findings and recommendations were discussed with the patient, and the medical team,  and all of patient's questions were answered to her expressed satisfaction.   -- Lynden Oxford, PA-C [facility] 04/19/2020, 7:58 AM [phone] M-F: 7am - 4pm

## 2020-04-19 NOTE — Progress Notes (Signed)
Patient ID: [name], female   DOB: 16-Apr-1966, 54 y.o.   MRN: [id number] [facility] PROGRESS NOTE  [name] WUJ:[id number] DOB: 16-Apr-1966 DOA: 04/28/2020 PCP: [place], Julie A, MD  HPI/Subjective: Patient complained of some abdominal pain and wanted some pain medications. Patient history of quadriplegia with 3 bedsores. Patient coming in with nausea vomiting and found to have a small bowel obstruction. Currently has NG tube in.  Objective: Vitals:   04/19/20 0355 04/19/20 1206  BP: 103/70 (!) 115/57  Pulse: 90 88  Resp: 16 18  Temp: 98.3 F (36.8 C) 98.9 F (37.2 C)  SpO2: 95% 98%    Intake/Output Summary (Last 24 hours) at 04/19/2020 1255 Last data filed at 04/19/2020 0320 Gross per 24 hour  Intake 322.47 ml  Output 1440 ml  Net -1117.53 ml   There were no vitals filed for this visit.  ROS: Review of Systems  Respiratory: Negative for cough and shortness of breath.   Cardiovascular: Negative for chest pain.  Gastrointestinal: Positive for abdominal pain. Negative for nausea and vomiting.   Exam: Physical Exam HENT:     Head: Normocephalic.     Mouth/Throat:     Pharynx: No oropharyngeal exudate.  Eyes:     General: Lids are normal.     Conjunctiva/sclera: Conjunctivae normal.     Pupils: Pupils are equal, round, and reactive to light.  Cardiovascular:     Rate and Rhythm: Normal rate and regular rhythm.     Heart sounds: Normal heart sounds, S1 normal and S2 normal.  Pulmonary:     Breath sounds: No decreased breath sounds, wheezing, rhonchi or rales.  Abdominal:     Palpations: Abdomen is soft.     Tenderness: There is abdominal tenderness in the right lower quadrant, suprapubic area and left lower quadrant.  Musculoskeletal:     Right ankle: No swelling.     Left ankle: No swelling.  Skin:    General: Skin is warm.     Findings: No rash.     Comments: See pictures of decubiti below: Top picture sacral, middle picture right thigh,  bottom picture upper back.  Neurological:     Mental Status: She is alert and oriented to person, place, and time.     Comments: Contracted upper extremities              Data Reviewed: Basic Metabolic Panel: Recent Labs  Lab 04/11/2020 1109 04/19/20 0443  NA 131* 136  K 5.3* 3.3*  CL 91* 102  CO2 25 25  GLUCOSE 121* 71  BUN 34* 20  CREATININE 0.44 <0.30*  CALCIUM 9.6 8.4*   Liver Function Tests: Recent Labs  Lab 04/04/2020 1109  AST 13*  ALT 8  ALKPHOS 72  BILITOT 0.9  PROT 8.6*  ALBUMIN 3.5   Recent Labs  Lab 04/09/2020 1109  LIPASE 32   CBC: Recent Labs  Lab 04/27/2020 1109 04/19/20 0443  WBC 5.6 17.9*  NEUTROABS 4.1  --   HGB 13.2 9.9*  HCT 41.2 31.1*  MCV 86.2 89.4  PLT 477* 407*     Recent Results (from the past 240 hour(s))  Urine culture     Status: Abnormal   Collection Time: 04/07/2020 10:19 AM   Specimen: Urine, Random  Result Value Ref Range Status   Specimen Description   Final    URINE, RANDOM Performed at [facility], [address] [place], [place] [address]    Special Requests   Final  NONE Performed at [facility], [address] [place], [place] [address]    Culture MULTIPLE SPECIES PRESENT, SUGGEST RECOLLECTION (A)  Final   Report Status 04/19/2020 FINAL  Final  Respiratory Panel by RT PCR (Flu A&B, Covid) - Nasopharyngeal Swab     Status: None   Collection Time: 04/21/2020  1:57 PM   Specimen: Nasopharyngeal Swab  Result Value Ref Range Status   SARS Coronavirus 2 by RT PCR NEGATIVE NEGATIVE Final    Comment: (NOTE) SARS-CoV-2 target nucleic acids are NOT DETECTED.  The SARS-CoV-2 RNA is generally detectable in upper respiratoy specimens during the acute phase of infection. The lowest concentration of SARS-CoV-2 viral copies this assay can detect is 131 copies/mL. A negative result does not preclude SARS-Cov-2 infection and should not be used as the sole basis for treatment or other patient  management decisions. A negative result may occur with  improper specimen collection/handling, submission of specimen other than nasopharyngeal swab, presence of viral mutation(s) within the areas targeted by this assay, and inadequate number of viral copies (<131 copies/mL). A negative result must be combined with clinical observations, patient history, and epidemiological information. The expected result is Negative.  Fact Sheet for Patients:  https://www.moore.com/  Fact Sheet for Healthcare Providers:  https://www.young.biz/  This test is no t yet approved or cleared by the [place] FDA and  has been authorized for detection and/or diagnosis of SARS-CoV-2 by FDA under an Emergency Use Authorization (EUA). This EUA will remain  in effect (meaning this test can be used) for the duration of the COVID-19 declaration under Section 564(b)(1) of the Act, 21 U.S.C. section 360bbb-3(b)(1), unless the authorization is terminated or revoked sooner.     Influenza A by PCR NEGATIVE NEGATIVE Final   Influenza B by PCR NEGATIVE NEGATIVE Final    Comment: (NOTE) The Xpert Xpress SARS-CoV-2/FLU/RSV assay is intended as an aid in  the diagnosis of influenza from Nasopharyngeal swab specimens and  should not be used as a sole basis for treatment. Nasal washings and  aspirates are unacceptable for Xpert Xpress SARS-CoV-2/FLU/RSV  testing.  Fact Sheet for Patients: https://www.moore.com/  Fact Sheet for Healthcare Providers: https://www.young.biz/  This test is not yet approved or cleared by the [place] FDA and  has been authorized for detection and/or diagnosis of SARS-CoV-2 by  FDA under an Emergency Use Authorization (EUA). This EUA will remain  in effect (meaning this test can be used) for the duration of the  Covid-19 declaration under Section 564(b)(1) of the Act, 21  U.S.C. section 360bbb-3(b)(1),  unless the authorization is  terminated or revoked. Performed at [facility], [address] [place], [place] [address]      Studies: DG Abd 1 View  Result Date: 04/19/2020 CLINICAL DATA:  Small bowel obstruction EXAM: ABDOMEN - 1 VIEW COMPARISON:  Yesterday FINDINGS: Continued small bowel obstruction with dilated loops measuring up to 5 cm in the central abdomen. Enteric tube remains in good position. Baclofen pump, IUD, and IVC filter. Prominent osteopenia. IMPRESSION: Continued small bowel obstruction. Electronically Signed   By: Marnee Spring M.D.   On: 04/19/2020 06:45   CT Abdomen Pelvis W Contrast  Result Date: 05/01/2020 CLINICAL DATA:  Abdominal pain. Nausea, vomiting, and weakness for 4 days. Patient is paraplegic. EXAM: CT ABDOMEN AND PELVIS WITH CONTRAST TECHNIQUE: Multidetector CT imaging of the abdomen and pelvis was performed using the standard protocol following bolus administration of intravenous contrast. CONTRAST:  66mL OMNIPAQUE IOHEXOL 300 MG/ML  SOLN COMPARISON:  None. FINDINGS: Lower chest: Opacity in the right base as seen on series 4, image 1 is consistent with an infectious or inflammatory process. Probable atelectasis in the left base. No other abnormalities in the lung bases. Hepatobiliary: There is a cyst or hemangioma in the right hepatic lobe. The liver is otherwise normal. Cholelithiasis is identified without obvious wall thickening. The portal vein is patent. Pancreas: Unremarkable. No pancreatic ductal dilatation or surrounding inflammatory changes. Spleen: Normal in size without focal abnormality. Adrenals/Urinary Tract: Adrenal glands are normal. Small bilateral renal cysts are noted. No suspicious masses. No hydronephrosis. There is a punctate stone in the right kidney on coronal image 49 measuring 2 mm. Visualized portions of the ureters are normal. No ureterectasis or ureteral stones. The patient has a urostomy which is decompressed on this study  and empties in the left lower quadrant. Stomach/Bowel: Stomach is unremarkable. The small bowel is dilated nearly along its entire length. There appear to be a few decompressed loops of small bowel in the right pelvis as seen on coronal image 30. There appears to be pneumatosis associated with some loops of small bowel in the left upper quadrant, well seen on coronal image 33. There is marked fecal loading in the rectum and distal sigmoid colon. The ascending colon and transverse colon are decompressed. The descending colon and proximal sigmoid colon are not well visualized. The appendix is not visualized but there is no secondary evidence of appendicitis. 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 The abdominal aorta is nonaneurysmal without significant atherosclerotic change. No gas seen in the venous system despite the pneumatosis. No adenopathy. Reproductive: The patient has an IUD. There appears to be a dominant cyst in the region the right adnexa, simple in appearance measuring up to 2.7 cm. No abnormalities in the left adnexa. Other: There is some free fluid in the abdomen, likely secondary to the bowel obstruction. Musculoskeletal: No acute bony abnormalities identified. IMPRESSION: 1. High-grade small bowel obstruction. The transition point is likely in the right lower abdomen a right pelvis with some decompressed loops in this region. There is pneumatosis associated with left upper quadrant loops of small bowel. No free air on today's study to suggest perforation. 2. Marked fecal loading in the rectum and distal sigmoid colon. 3. Infiltrate in the right base worrisome for pneumonia. 4. Decompression of the ascending and transverse colon. Poor visualization of the descending colon and proximal sigmoid colon. 5. Left lower quadrant urostomy.  No renal abnormalities are noted. 6. Cholelithiasis. 7. Probable dominant cyst/follicle in the right ovary, simple in appearance measuring 2.7 cm. No follow-up  necessary. Findings called to Dr. Larinda Buttery. Electronically Signed   By: Gerome Sam III M.D   On: 04/06/2020 13:55   DG Abd Portable 1 View  Result Date: 04/15/2020 CLINICAL DATA:  NG tube placement EXAM: PORTABLE ABDOMEN - 1 VIEW COMPARISON:  11/21/2010 FINDINGS: NG tube coils in the fundus of the stomach. IMPRESSION: NG tube in the stomach. Electronically Signed   By: Charlett Nose M.D.   On: 04/28/2020 15:51    Scheduled Meds: . enoxaparin (LOVENOX) injection  40 mg Subcutaneous Q24H  . fluticasone  2 spray Each Nare Daily  . fluticasone  2 puff Inhalation BID  . influenza vac split quadrivalent PF  0.5 mL Intramuscular Tomorrow-1000  . tiotropium  18 mcg Inhalation Daily   Continuous Infusions: . 0.9 % NaCl with KCl 20 mEq / L 75 mL/hr at 04/19/20 1055  . cefTRIAXone (ROCEPHIN)  IV Stopped (04/24/2020 1646)    Assessment/Plan:  1. Small bowel obstruction with NG tube in. Surgical team following. As needed nausea and pain medications. 2. Hypokalemia change IV fluids with potassium. 3. Leukocytosis on empiric Rocephin. This is not a UTI. 4. Decubitus ulcer on sacrum, right posterior thigh and upper back. All present on admission. Please see descriptions below. Wound care consultation. 5. Paraplegia, bedbound.  Pressure Injury 04/17/2020 Sacrum Stage 3 -  Full thickness tissue loss. Subcutaneous fat may be visible but bone, tendon or muscle are NOT exposed. (Active)  04/14/2020 1433  Location: Sacrum  Location Orientation:   Staging: Stage 3 -  Full thickness tissue loss. Subcutaneous fat may be visible but bone, tendon or muscle are NOT exposed.  Wound Description (Comments):   Present on Admission: Yes     Pressure Injury 04/23/2020 Back Lateral;Left;Upper Stage 2 -  Partial thickness loss of dermis presenting as a shallow open injury with a red, pink wound bed without slough. 1.75cm x 2cm (Active)  04/28/2020 1923  Location: Back  Location Orientation: Lateral;Left;Upper  Staging:  Stage 2 -  Partial thickness loss of dermis presenting as a shallow open injury with a red, pink wound bed without slough.  Wound Description (Comments): 1.75cm x 2cm  Present on Admission: Yes     Pressure Injury 04/04/2020 Buttocks Right Stage 3 -  Full thickness tissue loss. Subcutaneous fat may be visible but bone, tendon or muscle are NOT exposed. 5.5cm x 4.5cm x 0.75cm (Active)  04/02/2020 1924  Location: Buttocks  Location Orientation: Right  Staging: Stage 3 -  Full thickness tissue loss. Subcutaneous fat may be visible but bone, tendon or muscle are NOT exposed.  Wound Description (Comments): 5.5cm x 4.5cm x 0.75cm  Present on Admission: Yes       Code Status:     Code Status Orders  (From admission, onward)         Start     Ordered   04/20/2020 1415  Full code  Continuous        04/23/2020 1416        Code Status History    Date Active Date Inactive Code Status Order ID Comments User Context   10/13/2018 1437 10/15/2018 1738 Full Code [id number]  Milagros Loll, MD ED   09/07/2016 1022 09/09/2016 1938 Full Code [id number]  Delfino Lovett, MD Inpatient   Advance Care Planning Activity     Disposition Plan: Status is: Inpatient  Dispo: The patient is from: Home              Anticipated d/c is to: Home              Anticipated d/c date is: Conservative management for bowel obstruction sometimes takes 3 to 5 days and then if does not improve may need surgery and then would need another 3 to 5 days postoperatively if it comes down to surgery.              Patient currently being treated for small bowel obstruction.  Consultants:  General surgery  Antibiotics:  Rocephin  Time spent: 32 minutes, case discussed with nursing staff, transitional care team  [facility]  [facility]

## 2020-04-20 ENCOUNTER — Inpatient Hospital Stay: Payer: Medicare Other

## 2020-04-20 DIAGNOSIS — D72829 Elevated white blood cell count, unspecified: Secondary | ICD-10-CM | POA: Diagnosis not present

## 2020-04-20 DIAGNOSIS — E876 Hypokalemia: Secondary | ICD-10-CM | POA: Diagnosis not present

## 2020-04-20 DIAGNOSIS — K56609 Unspecified intestinal obstruction, unspecified as to partial versus complete obstruction: Secondary | ICD-10-CM | POA: Diagnosis not present

## 2020-04-20 DIAGNOSIS — L89153 Pressure ulcer of sacral region, stage 3: Secondary | ICD-10-CM | POA: Diagnosis not present

## 2020-04-20 MED ORDER — DIATRIZOATE MEGLUMINE & SODIUM 66-10 % PO SOLN
90.0000 mL | Freq: Once | ORAL | Status: AC
  Administered 2020-04-20: 90 mL via NASOGASTRIC

## 2020-04-20 NOTE — Progress Notes (Addendum)
[facility] SURGICAL PROGRESS NOTE (cpt [phone])  Hospital Day(s): 2.   Interval History: Patient seen and examined, no acute events or new complaints overnight. Patient is certainly less interactive and talkative this morning. She reports feeling very tired this morning. She continues to have abdominal soreness and distension. No nausea or emesis. NGT output recorded at 200 ccs. Labs are pending this morning. KUB without improvement. No reports of flatus.   Review of Systems:  Constitutional: denies fever, chills  HEENT: denies cough or congestion  Respiratory: denies any shortness of breath  Cardiovascular: denies chest pain or palpitations  Gastrointestinal: + abdominal pain, + distension, denied N/V, or diarrhea/and bowel function as per interval history Genitourinary: denies burning with urination or urinary frequency  Vital signs in last 24 hours: [min-max] current  Temp:  [98.1 F (36.7 C)-98.9 F (37.2 C)] 98.2 F (36.8 C) (10/19 0357) Pulse Rate:  [82-93] 93 (10/19 0357) Resp:  [18] 18 (10/19 0357) BP: (100-115)/(56-63) 101/63 (10/19 0357) SpO2:  [96 %-100 %] 100 % (10/19 0357)             Intake/Output last 2 shifts:  10/18 0701 - 10/19 0700 In: 1337.4 [I.V.:1237.4; IV Piggyback:100] Out: 1200 [Urine:1000; Emesis/NG output:200]   Physical Exam:  Constitutional: alert, cooperative and no distress  HENT: normocephalic without obvious abnormality, NGT in place Eyes: PERRL, EOM's grossly intact and symmetric  Respiratory: breathing non-labored at rest  Cardiovascular: regular rate and sinus rhythm  Gastrointestinal: Soft, periumbilical soreness, remains distended, no rebound/guarding, urostomy in place in LLQ. Patient has on the right lower quadrant subcutaneous baclofen pump Musculoskeletal: Chronic atrophy secondary to history of paraplegia   Labs:  CBC Latest Ref Rng & Units 04/19/2020 04/25/2020 10/13/2018  WBC 4.0 - 10.5 K/uL 17.9(H) 5.6 8.3   Hemoglobin 12.0 - 15.0 g/dL 7.8(E) 42.3 53.6  Hematocrit 36 - 46 % 31.1(L) 41.2 43.0  Platelets 150 - 400 K/uL 407(H) 477(H) 395   CMP Latest Ref Rng & Units 04/19/2020 05/01/2020 10/13/2018  Glucose 70 - 99 mg/dL 71 144(R) 82  BUN 6 - 20 mg/dL 20 15(Q) 12  Creatinine 0.44 - 1.00 mg/dL <0.08(Q) 7.61 <9.50(D)  Sodium 135 - 145 mmol/L 136 131(L) 140  Potassium 3.5 - 5.1 mmol/L 3.3(L) 5.3(H) 5.1  Chloride 98 - 111 mmol/L 102 91(L) 104  CO2 22 - 32 mmol/L 25 25 24   Calcium 8.9 - 10.3 mg/dL ) 9.6 9.2  Total Protein 6.5 - 8.1 g/dL - 8.6(H) -  Total Bilirubin 0.3 - 1.2 mg/dL - 0.9 -  Alkaline Phos 38 - 126 U/L - 72 -  AST 15 - 41 U/L - 13(L) -  ALT 0 - 44 U/L - 8 -     Imaging studies:   KUB (04/20/2020) personally reviewed which still shows markedly distended central bowel loops, no improvement from prior, and radiologist report reviewed: IMPRESSION: Persistent gaseous distention of upper abdominal small bowel consistent with small bowel obstruction.   Assessment/Plan: (ICD-10's: K43.609) 54 y.o. female with persistent small bowel obstruction, most likely secondary to post-surgical adhesions, complicated by history of paraplegia.    - Will order gastrografin challenge this morning to assess degree of obstruction. If she does not pass this, then she will likely require surgical intervention on Thursday (10/21)    - Continue NPO + IVF resuscitation; ? She may require TPN  - Continue with NGT decompression; LIS; monitor and record output  - Continue IV Abx (Rocephin) for UTI             -  Monitor abdominal examination; on-going bowel function             - Pain control prn (minimize narcotics if feasible); antiemetics prn             - Serial KUBs as needed             - Monitor leukocytosis   - Mobilize as toelrates   - Further management per primary service; we will follow     All of the above findings and recommendations were discussed with the patient, and the medical  team, and all of patient's questions were answered to her expressed satisfaction.  -- Lynden Oxford, PA-C [facility] 04/20/2020, 8:03 AM [phone] M-F: 7am - 4pm  I saw and evaluated the patient.  I agree with the above documentation, exam, and plan, which I have edited where appropriate. Duanne Guess  12:42 PM

## 2020-04-20 NOTE — Progress Notes (Signed)
Patient ID: [name], female   DOB: 21-Jun-1966, 54 y.o.   MRN: [id number] [facility] PROGRESS NOTE  DESTINA MANTEI ZDG:[id number] DOB: March 11, 1966 DOA: 05/04/20 PCP: [place], Julie A, MD  HPI/Subjective: Patient was given morphine prior to me seeing her.  She still having some abdominal pain.  Has not had any bowel movements or passed gas.  No nausea or vomiting with the NG tube.   Objective: Vitals:   04/20/20 0357 04/20/20 1211  BP: 101/63 (!) 102/59  Pulse: 93 84  Resp: 18 12  Temp: 98.2 F (36.8 C) 97.6 F (36.4 C)  SpO2: 100% 100%    Intake/Output Summary (Last 24 hours) at 04/20/2020 1318 Last data filed at 04/20/2020 0400 Gross per 24 hour  Intake 1337.39 ml  Output 1200 ml  Net 137.39 ml   There were no vitals filed for this visit.  ROS: Review of Systems  Respiratory: Negative for cough and shortness of breath.   Cardiovascular: Negative for chest pain.  Gastrointestinal: Positive for abdominal pain and constipation. Negative for nausea and vomiting.   Exam: Physical Exam HENT:     Head: Normocephalic.     Mouth/Throat:     Pharynx: No oropharyngeal exudate.  Eyes:     General: Lids are normal.     Conjunctiva/sclera: Conjunctivae normal.     Pupils: Pupils are equal, round, and reactive to light.  Cardiovascular:     Rate and Rhythm: Normal rate and regular rhythm.     Heart sounds: Normal heart sounds, S1 normal and S2 normal.  Pulmonary:     Breath sounds: No decreased breath sounds, wheezing, rhonchi or rales.  Abdominal:     Palpations: Abdomen is soft.     Tenderness: There is abdominal tenderness in the right lower quadrant, suprapubic area and left lower quadrant.  Musculoskeletal:     Right ankle: No swelling.     Left ankle: No swelling.  Skin:    General: Skin is warm.     Findings: No rash.  Neurological:     Mental Status: She is alert.     Comments: Unable to move legs. Able to move her fingers.  Has arm  contractions.       Data Reviewed: Basic Metabolic Panel: Recent Labs  Lab 05/04/2020 1109 04/19/20 0443  NA 131* 136  K 5.3* 3.3*  CL 91* 102  CO2 25 25  GLUCOSE 121* 71  BUN 34* 20  CREATININE 0.44 <0.30*  CALCIUM 9.6 8.4*   Liver Function Tests: Recent Labs  Lab 04-May-2020 1109  AST 13*  ALT 8  ALKPHOS 72  BILITOT 0.9  PROT 8.6*  ALBUMIN 3.5   Recent Labs  Lab May 04, 2020 1109  LIPASE 32  CBC: Recent Labs  Lab 05/04/2020 1109 04/19/20 0443  WBC 5.6 17.9*  NEUTROABS 4.1  --   HGB 13.2 9.9*  HCT 41.2 31.1*  MCV 86.2 89.4  PLT 477* 407*     Recent Results (from the past 240 hour(s))  Urine culture     Status: Abnormal   Collection Time: 2020/05/04 10:19 AM   Specimen: Urine, Random  Result Value Ref Range Status   Specimen Description   Final    URINE, RANDOM Performed at [facility], [address], [place], [place] [address]    Special Requests   Final    NONE Performed at [facility] [address], [place], [place] [address]    Culture MULTIPLE SPECIES PRESENT, SUGGEST RECOLLECTION (  A)  Final   Report Status 04/19/2020 FINAL  Final  Respiratory Panel by RT PCR (Flu A&B, Covid) - Nasopharyngeal Swab     Status: None   Collection Time: 04/02/2020  1:57 PM   Specimen: Nasopharyngeal Swab  Result Value Ref Range Status   SARS Coronavirus 2 by RT PCR NEGATIVE NEGATIVE Final    Comment: (NOTE) SARS-CoV-2 target nucleic acids are NOT DETECTED.  The SARS-CoV-2 RNA is generally detectable in upper respiratoy specimens during the acute phase of infection. The lowest concentration of SARS-CoV-2 viral copies this assay can detect is 131 copies/mL. A negative result does not preclude SARS-Cov-2 infection and should not be used as the sole basis for treatment or other patient management decisions. A negative result may occur with  improper specimen collection/handling, submission of specimen other than nasopharyngeal swab, presence of  viral mutation(s) within the areas targeted by this assay, and inadequate number of viral copies (<131 copies/mL). A negative result must be combined with clinical observations, patient history, and epidemiological information. The expected result is Negative.  Fact Sheet for Patients:  https://www.moore.com/  Fact Sheet for Healthcare Providers:  https://www.young.biz/  This test is no t yet approved or cleared by the [place] FDA and  has been authorized for detection and/or diagnosis of SARS-CoV-2 by FDA under an Emergency Use Authorization (EUA). This EUA will remain  in effect (meaning this test can be used) for the duration of the COVID-19 declaration under Section 564(b)(1) of the Act, 21 U.S.C. section 360bbb-3(b)(1), unless the authorization is terminated or revoked sooner.     Influenza A by PCR NEGATIVE NEGATIVE Final   Influenza B by PCR NEGATIVE NEGATIVE Final    Comment: (NOTE) The Xpert Xpress SARS-CoV-2/FLU/RSV assay is intended as an aid in  the diagnosis of influenza from Nasopharyngeal swab specimens and  should not be used as a sole basis for treatment. Nasal washings and  aspirates are unacceptable for Xpert Xpress SARS-CoV-2/FLU/RSV  testing.  Fact Sheet for Patients: https://www.moore.com/  Fact Sheet for Healthcare Providers: https://www.young.biz/  This test is not yet approved or cleared by the [place] FDA and  has been authorized for detection and/or diagnosis of SARS-CoV-2 by  FDA under an Emergency Use Authorization (EUA). This EUA will remain  in effect (meaning this test can be used) for the duration of the  Covid-19 declaration under Section 564(b)(1) of the Act, 21  U.S.C. section 360bbb-3(b)(1), unless the authorization is  terminated or revoked. Performed at [facility], [address] [place], [place] [address]      Studies: DG Abd 1  View  Result Date: 04/19/2020 CLINICAL DATA:  Small bowel obstruction EXAM: ABDOMEN - 1 VIEW COMPARISON:  Yesterday FINDINGS: Continued small bowel obstruction with dilated loops measuring up to 5 cm in the central abdomen. Enteric tube remains in good position. Baclofen pump, IUD, and IVC filter. Prominent osteopenia. IMPRESSION: Continued small bowel obstruction. Electronically Signed   By: Marnee Spring M.D.   On: 04/19/2020 06:45   DG Abd Portable 1V  Result Date: 04/20/2020 CLINICAL DATA:  Small bowel obstruction EXAM: PORTABLE ABDOMEN - 1 VIEW COMPARISON:  04/19/2020 FINDINGS: Enteric tube tip is in the left upper quadrant consistent with location in the upper stomach. Medication pump in the right lower quadrant. IVC filter. Surgical clips in the pelvis. Intrauterine device. Left lower quadrant ostomy. Gaseous distention of upper abdominal small bowel with decreased gas in the colon. Stool-filled rectum. Changes are consistent with small bowel  obstruction. No significant improvement since prior study. Degenerative changes in the spine and hips. Vascular calcifications. IMPRESSION: Persistent gaseous distention of upper abdominal small bowel consistent with small bowel obstruction. Electronically Signed   By: Burman Nieves M.D.   On: 04/20/2020 05:11   DG Abd Portable 1 View  Result Date: 04/12/2020 CLINICAL DATA:  NG tube placement EXAM: PORTABLE ABDOMEN - 1 VIEW COMPARISON:  11/21/2010 FINDINGS: NG tube coils in the fundus of the stomach. IMPRESSION: NG tube in the stomach. Electronically Signed   By: Charlett Nose M.D.   On: 04/15/2020 15:51    Scheduled Meds: . enoxaparin (LOVENOX) injection  40 mg Subcutaneous Q24H  . fluticasone  2 spray Each Nare Daily  . fluticasone  2 puff Inhalation BID  . influenza vac split quadrivalent PF  0.5 mL Intramuscular Tomorrow-1000  . tiotropium  18 mcg Inhalation Daily   Continuous Infusions: . 0.9 % NaCl with KCl 20 mEq / L 75 mL/hr at  04/20/20 0400  . cefTRIAXone (ROCEPHIN)  IV Stopped (04/19/20 1539)    Assessment/Plan:  1. Small bowel obstruction.  Continue NG tube.  Surgical team following.  As needed nausea and pain medications.  Surgical team ordered a Gastrografin challenge.  May end up needing surgery. 2. Hypokalemia continue IV fluids with potassium 3. Leukocytosis on empiric Rocephin.  This is not a UTI. 4. Stage III decubitus ulcer on sacrum and posterior right thigh, stage II decubiti upper back.  All present on admission.  Please see further descriptions below.  Appreciate wound care consultation. 5. Paraplegia and bedbound  Pressure Injury 05/01/2020 Sacrum Stage 3 -  Full thickness tissue loss. Subcutaneous fat may be visible but bone, tendon or muscle are NOT exposed. (Active)  04/06/2020 1433  Location: Sacrum  Location Orientation:   Staging: Stage 3 -  Full thickness tissue loss. Subcutaneous fat may be visible but bone, tendon or muscle are NOT exposed.  Wound Description (Comments):   Present on Admission: Yes     Pressure Injury 04/23/2020 Back Lateral;Left;Upper Stage 2 -  Partial thickness loss of dermis presenting as a shallow open injury with a red, pink wound bed without slough. 1.75cm x 2cm (Active)  04/02/2020 1923  Location: Back  Location Orientation: Lateral;Left;Upper  Staging: Stage 2 -  Partial thickness loss of dermis presenting as a shallow open injury with a red, pink wound bed without slough.  Wound Description (Comments): 1.75cm x 2cm  Present on Admission: Yes     Pressure Injury 04/14/2020 Buttocks Right Stage 3 -  Full thickness tissue loss. Subcutaneous fat may be visible but bone, tendon or muscle are NOT exposed. 5.5cm x 4.5cm x 0.75cm (Active)  04/08/2020 1924  Location: Buttocks  Location Orientation: Right  Staging: Stage 3 -  Full thickness tissue loss. Subcutaneous fat may be visible but bone, tendon or muscle are NOT exposed.  Wound Description (Comments): 5.5cm x 4.5cm x  0.75cm  Present on Admission: Yes       Code Status:     Code Status Orders  (From admission, onward)         Start     Ordered   04/25/2020 1415  Full code  Continuous        04/25/2020 1416        Code Status History    Date Active Date Inactive Code Status Order ID Comments User Context   10/13/2018 1437 10/15/2018 1738 Full Code [id number]  Milagros Loll, MD ED   09/07/2016  1022 09/09/2016 1938 Full Code [id number]  Delfino LovettShah, Vipul, MD Inpatient   Advance Care Planning Activity     Family Communication: The patient asked me to call her son more recent but no phone number listed in the computer and she did not know it.  I tried to reach her niece Therese SarahShamika but left a message. Disposition Plan: Status is: Inpatient  Dispo: The patient is from: Home              Anticipated d/c is to: Home              Anticipated d/c date is: If the patient requires surgery will need another 3 to 5 days postoperatively              Patient currently being treated for small bowel obstruction  Consultants:  General surgery  Antibiotics:  Empiric Rocephin  Time spent: 27 minutes  Airlie Blumenberg [employer]  [facility]

## 2020-04-21 ENCOUNTER — Inpatient Hospital Stay: Payer: Medicare Other

## 2020-04-21 ENCOUNTER — Inpatient Hospital Stay: Payer: Self-pay

## 2020-04-21 ENCOUNTER — Ambulatory Visit: Payer: Medicare Other | Admitting: Internal Medicine

## 2020-04-21 DIAGNOSIS — E43 Unspecified severe protein-calorie malnutrition: Secondary | ICD-10-CM | POA: Insufficient documentation

## 2020-04-21 DIAGNOSIS — K56609 Unspecified intestinal obstruction, unspecified as to partial versus complete obstruction: Secondary | ICD-10-CM | POA: Diagnosis not present

## 2020-04-21 DIAGNOSIS — L89102 Pressure ulcer of unspecified part of back, stage 2: Secondary | ICD-10-CM | POA: Diagnosis not present

## 2020-04-21 DIAGNOSIS — Z7189 Other specified counseling: Secondary | ICD-10-CM

## 2020-04-21 DIAGNOSIS — Z515 Encounter for palliative care: Secondary | ICD-10-CM | POA: Diagnosis not present

## 2020-04-21 DIAGNOSIS — J449 Chronic obstructive pulmonary disease, unspecified: Secondary | ICD-10-CM | POA: Diagnosis not present

## 2020-04-21 DIAGNOSIS — N390 Urinary tract infection, site not specified: Secondary | ICD-10-CM | POA: Diagnosis not present

## 2020-04-21 DIAGNOSIS — G8220 Paraplegia, unspecified: Secondary | ICD-10-CM | POA: Diagnosis not present

## 2020-04-21 LAB — CBC
HCT: 30.5 % — ABNORMAL LOW (ref 36.0–46.0)
Hemoglobin: 9.4 g/dL — ABNORMAL LOW (ref 12.0–15.0)
MCH: 28.3 pg (ref 26.0–34.0)
MCHC: 30.8 g/dL (ref 30.0–36.0)
MCV: 91.9 fL (ref 80.0–100.0)
Platelets: 469 10*3/uL — ABNORMAL HIGH (ref 150–400)
RBC: 3.32 MIL/uL — ABNORMAL LOW (ref 3.87–5.11)
RDW: 16.3 % — ABNORMAL HIGH (ref 11.5–15.5)
WBC: 19.3 10*3/uL — ABNORMAL HIGH (ref 4.0–10.5)
nRBC: 0 % (ref 0.0–0.2)

## 2020-04-21 LAB — BASIC METABOLIC PANEL
Anion gap: 21 — ABNORMAL HIGH (ref 5–15)
Anion gap: 21 — ABNORMAL HIGH (ref 5–15)
BUN: 11 mg/dL (ref 6–20)
BUN: 8 mg/dL (ref 6–20)
CO2: 18 mmol/L — ABNORMAL LOW (ref 22–32)
CO2: 16 mmol/L — ABNORMAL LOW (ref 22–32)
Calcium: 8.2 mg/dL — ABNORMAL LOW (ref 8.9–10.3)
Calcium: 8.1 mg/dL — ABNORMAL LOW (ref 8.9–10.3)
Chloride: 118 mmol/L — ABNORMAL HIGH (ref 98–111)
Chloride: 120 mmol/L — ABNORMAL HIGH (ref 98–111)
Creatinine, Ser: 0.44 mg/dL (ref 0.44–1.00)
Creatinine, Ser: 0.55 mg/dL (ref 0.44–1.00)
GFR, Estimated: >60 mL/min (ref >60)
GFR, Estimated: >60 mL/min (ref >60)
Glucose, Bld: 86 mg/dL (ref 70–99)
Glucose, Bld: 234 mg/dL — ABNORMAL HIGH (ref 70–99)
Potassium: 3 mmol/L — ABNORMAL LOW (ref 3.5–5.1)
Potassium: 3.2 mmol/L — ABNORMAL LOW (ref 3.5–5.1)
Sodium: 157 mmol/L — ABNORMAL HIGH (ref 135–145)
Sodium: 157 mmol/L — ABNORMAL HIGH (ref 135–145)

## 2020-04-21 LAB — GLUCOSE, CAPILLARY: Glucose-Capillary: 82 mg/dL (ref 70–99)

## 2020-04-21 LAB — MAGNESIUM: Magnesium: 1.6 mg/dL — ABNORMAL LOW (ref 1.7–2.4)

## 2020-04-21 LAB — PHOSPHORUS: Phosphorus: 3.3 mg/dL (ref 2.5–4.6)

## 2020-04-21 LAB — SODIUM: Sodium: 159 mmol/L — ABNORMAL HIGH (ref 135–145)

## 2020-04-21 LAB — AMMONIA: Ammonia: 40 umol/L — ABNORMAL HIGH (ref 9–35)

## 2020-04-21 LAB — LACTIC ACID, PLASMA: Lactic Acid, Venous: 0.9 mmol/L (ref 0.5–1.9)

## 2020-04-21 MED ORDER — POTASSIUM CHLORIDE 2 MEQ/ML IV SOLN
INTRAVENOUS | Status: AC
  Filled 2020-04-21: qty 1000

## 2020-04-21 MED ORDER — POTASSIUM CHLORIDE 2 MEQ/ML IV SOLN
INTRAVENOUS | Status: DC
  Filled 2020-04-21 (×4): qty 1000

## 2020-04-21 MED ORDER — POTASSIUM CHLORIDE 10 MEQ/100ML IV SOLN: 10.0000 meq | INTRAVENOUS | Status: DC

## 2020-04-21 MED ORDER — LACTATED RINGERS IV BOLUS
250.0000 mL | Freq: Once | INTRAVENOUS | Status: AC
  Administered 2020-04-21: 250 mL via INTRAVENOUS

## 2020-04-21 MED ORDER — MAGNESIUM SULFATE 2 GM/50ML IV SOLN
2.0000 g | Freq: Once | INTRAVENOUS | Status: AC
  Administered 2020-04-21: 2 g via INTRAVENOUS
  Filled 2020-04-21: qty 50

## 2020-04-21 MED ORDER — DEXTROSE 5 % IV SOLN: INTRAVENOUS | Status: DC

## 2020-04-21 MED ORDER — PIPERACILLIN-TAZOBACTAM 3.375 G IVPB
3.3750 g | Freq: Three times a day (TID) | INTRAVENOUS | Status: DC
  Administered 2020-04-21 – 2020-04-22 (×3): 3.375 g via INTRAVENOUS
  Filled 2020-04-21 (×4): qty 50

## 2020-04-21 MED ORDER — SODIUM CHLORIDE 0.9% FLUSH: 10.0000 mL | INTRAVENOUS | Status: DC | PRN

## 2020-04-21 MED ORDER — INSULIN ASPART 100 UNIT/ML ~~LOC~~ SOLN
0.0000 [IU] | SUBCUTANEOUS | Status: DC
  Administered 2020-04-22 (×2): 1 [IU] via SUBCUTANEOUS
  Administered 2020-04-22: 4 [IU] via SUBCUTANEOUS
  Administered 2020-04-22 – 2020-04-25 (×3): 1 [IU] via SUBCUTANEOUS
  Administered 2020-04-26: 2 [IU] via SUBCUTANEOUS
  Administered 2020-04-26 (×2): 1 [IU] via SUBCUTANEOUS
  Filled 2020-04-21 (×9): qty 1

## 2020-04-21 MED ORDER — TRAVASOL 10 % IV SOLN
INTRAVENOUS | Status: AC
  Filled 2020-04-21: qty 249.6

## 2020-04-21 MED ORDER — CHLORHEXIDINE GLUCONATE CLOTH 2 % EX PADS
6.0000 | MEDICATED_PAD | Freq: Every day | CUTANEOUS | Status: DC
  Administered 2020-04-23 – 2020-04-28 (×5): 6 via TOPICAL

## 2020-04-21 MED ORDER — CHLORHEXIDINE GLUCONATE CLOTH 2 % EX PADS: 6.0000 | MEDICATED_PAD | Freq: Every day | CUTANEOUS | Status: DC

## 2020-04-21 NOTE — Progress Notes (Signed)
   04/21/20 1725  Assess: MEWS Score  Temp (!) 97.5 F (36.4 C)  BP (!) 89/49  Pulse Rate 64  Resp 12  SpO2 100 %  O2 Device Room Air  Assess: MEWS Score  MEWS Temp 0  MEWS Systolic 1  MEWS Pulse 0  MEWS RR 1  MEWS LOC 1  MEWS Score 3  MEWS Score Color Yellow  Assess: if the MEWS score is Yellow or Red  Were vital signs taken at a resting state? Yes  Focused Assessment Change from prior assessment (see assessment flowsheet)  Early Detection of Sepsis Score *See Row Information* Low  MEWS guidelines implemented *See Row Information* Yes  Treat  MEWS Interventions Administered prn meds/treatments  Take Vital Signs  Increase Vital Sign Frequency  Yellow: Q 2hr X 2 then Q 4hr X 2, if remains yellow, continue Q 4hrs  Escalate  MEWS: Escalate Yellow: discuss with charge nurse/RN and consider discussing with provider and RRT  Notify: Charge Nurse/RN  Name of Charge Nurse/RN Notified Ree Kida  Date Charge Nurse/RN Notified 04/21/20  Time Charge Nurse/RN Notified 1754  Notify: Provider  Provider Name/Title Dr.Patel, S  Date Provider Notified 04/21/20  Time Provider Notified 1754  Notification Type Call  Notification Reason Change in status  Response See new orders  Date of Provider Response 04/21/20  Time of Provider Response 1755  copy and pasted for Arlyss Queen RN

## 2020-04-21 NOTE — Plan of Care (Signed)
The patient was educated on the importance of oral care. The patient has remained calm yet drowsy. No issues with breathing. Yellow MEWS score protocol has been initiated as the patient has become lethargic and heart rate has increased. No falls. The patient has not gotten out of bed during the night. Foley catheter in place. Incontinent of bowels. No pain has been reported at this time. Monitoring closely for any change in health status.  Problem: Education: Goal: Knowledge of General Education information will improve Description: Including pain rating scale, medication(s)/side effects and non-pharmacologic comfort measures Outcome: Progressing   Problem: Health Behavior/Discharge Planning: Goal: Ability to manage health-related needs will improve Outcome: Progressing   Problem: Clinical Measurements: Goal: Ability to maintain clinical measurements within normal limits will improve Outcome: Progressing Goal: Will remain free from infection Outcome: Progressing Goal: Diagnostic test results will improve Outcome: Progressing Goal: Respiratory complications will improve Outcome: Progressing Goal: Cardiovascular complication will be avoided Outcome: Progressing   Problem: Skin Integrity: Goal: Risk for impaired skin integrity will decrease Outcome: Progressing   Problem: Safety: Goal: Ability to remain free from injury will improve Outcome: Progressing   Problem: Pain Managment: Goal: General experience of comfort will improve Outcome: Progressing   Problem: Elimination: Goal: Will not experience complications related to bowel motility Outcome: Progressing Goal: Will not experience complications related to urinary retention Outcome: Progressing   Problem: Coping: Goal: Level of anxiety will decrease Outcome: Progressing   Problem: Nutrition: Goal: Adequate nutrition will be maintained Outcome: Progressing   Problem: Activity: Goal: Risk for activity intolerance will  decrease Outcome: Progressing

## 2020-04-21 NOTE — Progress Notes (Addendum)
[facility] SURGICAL PROGRESS NOTE (cpt [phone])  Hospital Day(s): 3.   Interval History: Patient seen and examined, no acute events or new complaints overnight. Patient remains somnolent and non-participatory. I am unable to reliably obtain any history from her this morning. CBC is concerning for worsening leukocytosis this morning to 19.3K. Renal function remains normal, sCr - 0.44, UO - 700 ccs. She does continue to have hypokalemia to 3.0. hypernatremic to 157, repeat pending. Hypomagnesemia to 1.6.   Review of Systems:  Unable to reliably preform secondary to somnolence  Vital signs in last 24 hours: [min-max] current  Temp:  [97.6 F (36.4 C)-98.4 F (36.9 C)] 97.9 F (36.6 C) (10/20 0445) Pulse Rate:  [84-110] 97 (10/20 0445) Resp:  [12-20] 20 (10/20 0445) BP: (102-124)/(59-75) 110/74 (10/20 0445) SpO2:  [97 %-100 %] 97 % (10/20 0445) Weight:  [33.9 kg] 33.9 kg (10/20 0511)     Height: 5\' 1"  (154.9 cm) Weight: 33.9 kg     Intake/Output last 2 shifts:  10/19 0701 - 10/20 0700 In: 1642.1 [I.V.:1542.1; IV Piggyback:100] Out: 950 [Urine:700; Emesis/NG output:250]   Physical Exam:  Constitutional: somnolent, briefly opens eyes to verbal stimuli HENT: normocephalic without obvious abnormality, NGT in place Eyes: PERRL, EOM's grossly intact and symmetric  Respiratory: breathing non-labored at rest  Cardiovascular: regular rate and sinus rhythm  Gastrointestinal:Soft,unable to reliably assess tenderness,remains distended and tympanic, no rebound/guarding, urostomy in place in LLQ.Patient has on the right lower quadrant subcutaneous baclofen pump Musculoskeletal:Chronic atrophy secondary to history of paraplegia    Labs:  CBC Latest Ref Rng & Units 04/21/2020 04/19/2020 04/20/2020  WBC 4.0 - 10.5 K/uL 19.3(H) 17.9(H) 5.6  Hemoglobin 12.0 - 15.0 g/dL 04/20/2020) 6.1(P) 5.0(D  Hematocrit 36 - 46 % 30.5(L) 31.1(L) 41.2  Platelets 150 - 400 K/uL 469(H) 407(H) 477(H)    CMP Latest Ref Rng & Units 04/21/2020 04/19/2020 04/03/2020  Glucose 70 - 99 mg/dL 86 71 04/20/2020)  BUN 6 - 20 mg/dL 11 20 712(W)  Creatinine 0.44 - 1.00 mg/dL 58(K 9.98) <3.38(S  Sodium 135 - 145 mmol/L 157(H) 136 131(L)  Potassium 3.5 - 5.1 mmol/L 3.0(L) 3.3(L) 5.3(H)  Chloride 98 - 111 mmol/L 118(H) 102 91(L)  CO2 22 - 32 mmol/L 18(L) 25 25  Calcium 8.9 - 10.3 mg/dL 8.2(L) 8.4(L) 9.6  Total Protein 6.5 - 8.1 g/dL - - 8.6(H)  Total Bilirubin 0.3 - 1.2 mg/dL - - 0.9  Alkaline Phos 38 - 126 U/L - - 72  AST 15 - 41 U/L - - 13(L)  ALT 0 - 44 U/L - - 8     Imaging studies:   KUB (04/20/2020) personally reviewed with contrast material remaining in stomach, persistent and unchanged small bowel dilation, and radiologist report reviewed:  IMPRESSION: Continued dilated small bowel loops compatible with small bowel obstruction. No change since prior study.  Contrast material seen within the stomach presumably injected through the NG tube.   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   - Unfortunately, she does not appear to have made any clinical progression and KUB with gastrografin does not appear improved. At this point, we will tentatively plan for exploratory laparotomy tomorrow (10/21) with Dr [id number] pending anesthesia/OR availability. She does not appear capable to give consent currently, will attempt to reach out to emergency contacts to discuss care and POC. We are having difficulty reaching family members  - Will add STAT lactic  acid  - ABx switched to IV Zosyn this morning    - follow up 24 hour KUB  - I do think we should initiate TPN + PICC  - Continue NPO + IVF resuscitation;             - Continue with NGT decompression; LIS; monitor and record output - Monitor abdominal examination; on-going bowel function -  Pain control prn (minimize narcotics if feasible); antiemetics prn - Serial KUBs as needed - Monitor leukocytosis              - Mobilize as toelrates              - Further management per primary service; we will follow   All of the above findings and recommendations were discussed with the patient, and the medical team, and all of patient's questions were answered to her expressed satisfaction.  -- Lynden Oxford, PA-C [facility] 04/21/2020, 8:06 AM [phone] M-F: 7am - 4pm

## 2020-04-21 NOTE — Progress Notes (Signed)
Peripherally Inserted Central Catheter Placement  The IV Nurse has discussed with the patient and/or persons authorized to consent for the patient, the purpose of this procedure and the potential benefits and risks involved with this procedure.  The benefits include less needle sticks, lab draws from the catheter, and the patient may be discharged home with the catheter. Risks include, but not limited to, infection, bleeding, blood clot (thrombus formation), and puncture of an artery; nerve damage and irregular heartbeat and possibility to perform a PICC exchange if needed/ordered by physician.  Alternatives to this procedure were also discussed.  Bard Power PICC patient education guide, fact sheet on infection prevention and patient information card has been provided to patient /or left at bedside.    PICC Placement Documentation  PICC Double Lumen 04/21/20 PICC Right Brachial 31 cm 0 cm (Active)  Indication for Insertion or Continuance of Line Administration of hyperosmolar/irritating solutions (i.e. TPN, Vancomycin, etc.) 04/21/20 1322  Exposed Catheter (cm) 0 cm 04/21/20 1322  Site Assessment Clean;Dry;Intact 04/21/20 1322  Lumen #1 Status Flushed;Blood return noted;Saline locked 04/21/20 1322  Lumen #2 Status Flushed;Blood return noted;Saline locked 04/21/20 1322  Dressing Type Transparent 04/21/20 1322  Dressing Status Clean;Dry;Intact 04/21/20 1322  Antimicrobial disc in place? Yes 04/21/20 1322  Dressing Change Due 04/28/20 04/21/20 1322       Audrie Gallus 04/21/2020, 1:26 PM

## 2020-04-21 NOTE — Consult Note (Signed)
PHARMACY - TOTAL PARENTERAL NUTRITION CONSULT NOTE   Indication: bowel obstruction  Patient Measurements: Height: 5\' 1"  (154.9 cm) Weight: 33.9 kg (74 lb 11.8 oz) IBW/kg (Calculated) : 47.8   Body mass index is 14.12 kg/m.  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 KUB with gastrografin does not appear improved per surgical notes  Glucose / Insulin: no h/o DM or elevated BGs Electrolytes: see additional note for correction strategy Renal:  SCr<1, stable LFTs / TGs: LFTs wnl at baseline Prealbumin / albumin: pending / 3.5 g/dL Intake / Output; MIVF:  10/19 0701 - 10/20 0700 In: 1642.1 [I.V.:1542.1; IV Piggyback:100] Out: 950 [Urine:700; Emesis/NG output:250]   5% dextrose w/ 40 mEq KCl/L at 100 mL/hr  GI Imaging: Surgeries / Procedures:   Central access: 04/21/20 (pending) TPN start date: 04/21/20 (pending central line insertion)  Nutritional Goals (per RD recommendation on 04/21/20): kCal: 1200 - 1400 kCal/day, Protein: 60 - 70 g/day, Fluid: 1000 - 1200 mL/day  Current Nutrition:  NPO  Plan:   Start TPN at 20 mL/hr at 1800 (total volume 580 mL)  Nutritional Components:  Amino Acids (10%): 52 g/L (24.96 g)  Dextrose: 15.3 % (73.4g)  Lipids (as intralipid): 31.3 g/L (15 g)  Calories: 499.74 kCal   Electrolytes in TPN (standard): 54mEq/L of Na, 35mEq/L of K, 78mEq/L of Ca, 33mEq/L of Mg, and 24mmol/L of Phos. Cl:Ac ratio 1:1  Add standard MVI, 100 mg thiamine and trace elements to TPN  Initiate very sensitive q6h SSI and adjust as needed   Reduce MIVF to 70 mL/hr at 1800  Monitor TPN labs on Mon/Thurs, daily until stable  12m 04/21/2020,10:07 AM

## 2020-04-21 NOTE — Consult Note (Signed)
Consultation Note Date: 04/21/2020   Patient Name: [name]  DOB: 07/25/1965  MRN: [id number]  Age / Sex: 54 y.o., female  PCP: [place], Julie A, MD Referring Physician: Enedina Finner, MD  Reason for Consultation: Establishing goals of care  HPI/Patient Profile: 54 y.o. female  with past medical history of COPD, quadriplegia s/p spinal cord injury, and cystectomy with ileal conduit diversion admitted on 05-04-20 with N/V x4 days and abd pain. CT of abd revealed high grade SBO.  Plan for ex lap and ?PEG placement 10/21. TPN will be initiated 10/20. PMT consulted for GOC.  Clinical Assessment and Goals of Care: I have reviewed medical records including EPIC notes, labs and imaging, received report from Dr. Enedina Finner, assessed the patient and then spoke with her son Everlean Alstrom to discuss diagnosis prognosis, GOC, EOL wishes, disposition and options.  I introduced Palliative Medicine as specialized medical care for people living with serious illness. It focuses on providing relief from the symptoms and stress of a serious illness. The goal is to improve quality of life for both the patient and the family.  We discussed a brief life review of the patient. Everlean Alstrom shares that patient has 3 sons and 2 nieces - nieces have served as primary caregivers. She also has home health aids. Everlean Alstrom shares patient is typically awake, lively and interactive - playing games on her ipad.   As far as functional and nutritional status, she depends on others for ADLs. Son tells me patient has not been eating well for the past month as she has been under stress d/t boyfriend being in an accident.     We discussed patient's current illness and what it means in the larger context of patient's on-going co-morbidities.  I attempted to elicit values and goals of care important to the patient. Discussed plans for surgery. Discussed that patient is at higher risk d/t baseline  conditions. Discussed concern about mental status. Son shares patient and family are interested in all aggressive medical interventions offered to prolong life including surgery, intubation, and CPR/defib.  Discussed with Maruice the importance of continued conversation with family and the medical providers regarding overall plan of care and treatment options, ensuring decisions are within the context of the patient's values and GOCs.    Palliative Care services outpatient were explained and offered. Everlean Alstrom is agreeable.   Questions and concerns were addressed. The family was encouraged to call with questions or concerns.   Primary Decision Maker NEXT OF KIN - sons as patient currently unable to make decisions independently    SUMMARY OF RECOMMENDATIONS   - continue will full scope/full code care - have discussed outpatient palliative to follow with palliative liaisons and patient's son - will follow  Code Status/Advance Care Planning:  Full code  Prognosis:   Unable to determine  Discharge Planning: To Be Determined      Primary Diagnoses: Present on Admission: . SBO (small bowel obstruction) (HCC) . COPD (chronic obstructive pulmonary disease) (HCC) . Paraplegic spinal paralysis (HCC) . Acute lower UTI . Sacral decubitus ulcer, stage III (HCC) . Pressure ulcer of back   I have reviewed the medical record, interviewed the patient and family, and examined the patient. The following aspects are pertinent.  Past Medical History:  Diagnosis Date  . COPD (chronic obstructive pulmonary disease) (HCC)   . History of urostomy   . Paraplegic spinal paralysis [facility])    Social History   Socioeconomic History  . Marital status: Divorced  Spouse name: Not on file  . Number of children: Not on file  . Years of education: Not on file  . Highest education level: Not on file  Occupational History  . Not on file  Tobacco Use  . Smoking status: Never Smoker  . Smokeless  tobacco: Never Used  Substance and Sexual Activity  . Alcohol use: No  . Drug use: Not on file  . Sexual activity: Not on file  Other Topics Concern  . Not on file  Social History Narrative  . Not on file   Social Determinants of Health   Financial Resource Strain:   . Difficulty of Paying Living Expenses: Not on file  Food Insecurity:   . Worried About Programme researcher, broadcasting/film/video in the Last Year: Not on file  . Ran Out of Food in the Last Year: Not on file  Transportation Needs:   . Lack of Transportation (Medical): Not on file  . Lack of Transportation (Non-Medical): Not on file  Physical Activity:   . Days of Exercise per Week: Not on file  . Minutes of Exercise per Session: Not on file  Stress:   . Feeling of Stress : Not on file  Social Connections:   . Frequency of Communication with Friends and Family: Not on file  . Frequency of Social Gatherings with Friends and Family: Not on file  . Attends Religious Services: Not on file  . Active Member of Clubs or Organizations: Not on file  . Attends Banker Meetings: Not on file  . Marital Status: Not on file   History reviewed. No pertinent family history. Scheduled Meds: . Chlorhexidine Gluconate Cloth  6 each Topical Daily  . enoxaparin (LOVENOX) injection  40 mg Subcutaneous Q24H  . fluticasone  2 spray Each Nare Daily  . fluticasone  2 puff Inhalation BID  . influenza vac split quadrivalent PF  0.5 mL Intramuscular Tomorrow-1000  . [START ON 04/19/2020] insulin aspart  0-6 Units Subcutaneous Q4H  . tiotropium  18 mcg Inhalation Daily   Continuous Infusions: . dextrose 5 % with kcl 100 mL/hr at 04/21/20 1427  . dextrose 5 % with kcl    . piperacillin-tazobactam (ZOSYN)  IV    . TPN ADULT (ION)     PRN Meds:.morphine injection, ondansetron **OR** ondansetron (ZOFRAN) IV, phenol, sodium chloride flush Allergies  Allergen Reactions  . Shellfish Allergy Shortness Of Breath   Review of Systems  Unable to  perform ROS: Patient unresponsive    Physical Exam Constitutional:      Comments: unresponsive  Pulmonary:     Effort: Pulmonary effort is normal. No respiratory distress.  Skin:    General: Skin is warm and dry.     Vital Signs: BP 110/74 (BP Location: Right Arm)   Pulse 97   Temp 97.9 F (36.6 C)   Resp 20   Ht 5\' 1"  (1.549 m)   Wt 33.9 kg   SpO2 97%   BMI 14.12 kg/m  Pain Scale: PAINAD   Pain Score: 0-No pain   SpO2: SpO2: 97 % O2 Device:SpO2: 97 % O2 Flow Rate: .   IO: Intake/output summary:   Intake/Output Summary (Last 24 hours) at 04/21/2020 1541 Last data filed at 04/21/2020 0800 Gross per 24 hour  Intake 1959.61 ml  Output 1200 ml  Net 759.61 ml    LBM: Last BM Date: 04/20/20 Baseline Weight: Weight: 33.9 kg Most recent weight: Weight: 33.9 kg     Palliative Assessment/Data:  PPS 10%    Time Total: 65 minutes Greater than 50%  of this time was spent counseling and coordinating care related to the above assessment and plan.  Gerlean Ren, DNP, AGNP-C Palliative Medicine Team [phone] Pager: [phone]

## 2020-04-21 NOTE — Progress Notes (Addendum)
[facility]  - [place] [facility]   PATIENT NAME: [name]    MR#:  [id number]  DATE OF BIRTH:  Nov 18, 1965  SUBJECTIVE:  patient barely able to keep eyes open or hold meaningful conversation. According to RN two days ago patient was very talkative. During my conversation open her eyes and closed momentarily. No family at bedside REVIEW OF SYSTEMS:   Review of Systems  Unable to perform ROS: Mental status change   Tolerating Diet:npo Tolerating PT: bedbound due to functional quadriplegia  DRUG ALLERGIES:   Allergies  Allergen Reactions  . Shellfish Allergy Shortness Of Breath    VITALS:  Blood pressure 110/74, pulse 97, temperature 97.9 F (36.6 C), resp. rate 20, height 5\' 1"  (1.549 m), weight 33.9 kg, SpO2 97 %.  PHYSICAL EXAMINATION:   Physical Exam limited exam secondary to mental status change  GENERAL:  54 y.o.-year-old patient lying in the bed with no acute distress. Thin cachectic severely malnourished EYES: sunken eyeballs HEENT: Head atraumatic, normocephalic. Oropharynx and nasopharynx Drive poor dentition LUNGS: Normal breath sounds bilaterally, no wheezing, rales, rhonchi. No use of accessory muscles of respiration.  CARDIOVASCULAR: S1, S2 normal. No murmurs, rubs, or gallops.  ABDOMEN: Soft, nontender, nondistended. urostomy bag plus  EXTREMITIES: thin atrophied with contractors NEUROLOGIC: lethargic. Unable to assess  PSYCHIATRIC:  patient is lethargic SKIN: Pressure Injury 04/21/2020 Sacrum Stage 3 -  Full thickness tissue loss. Subcutaneous fat may be visible but bone, tendon or muscle are NOT exposed. (Active)  04/05/2020 1433  Location: Sacrum  Location Orientation:   Staging: Stage 3 -  Full thickness tissue loss. Subcutaneous fat may be visible but bone, tendon or muscle are NOT exposed.  Wound Description (Comments):   Present on Admission: Yes     Pressure Injury 04/11/2020 Back Lateral;Left;Upper Stage 2 -  Partial thickness  loss of dermis presenting as a shallow open injury with a red, pink wound bed without slough. 1.75cm x 2cm (Active)  04/07/2020 1923  Location: Back  Location Orientation: Lateral;Left;Upper  Staging: Stage 2 -  Partial thickness loss of dermis presenting as a shallow open injury with a red, pink wound bed without slough.  Wound Description (Comments): 1.75cm x 2cm  Present on Admission: Yes     Pressure Injury 04/25/2020 Buttocks Right Stage 3 -  Full thickness tissue loss. Subcutaneous fat may be visible but bone, tendon or muscle are NOT exposed. 5.5cm x 4.5cm x 0.75cm (Active)  04/17/2020 1924  Location: Buttocks  Location Orientation: Right  Staging: Stage 3 -  Full thickness tissue loss. Subcutaneous fat may be visible but bone, tendon or muscle are NOT exposed.  Wound Description (Comments): 5.5cm x 4.5cm x 0.75cm  Present on Admission: Yes        LABORATORY PANEL:  CBC Recent Labs  Lab 04/21/20 0415  WBC 19.3*  HGB 9.4*  HCT 30.5*  PLT 469*    Chemistries  Recent Labs  Lab 04/13/2020 1109 04/19/20 0443 04/21/20 0415 04/21/20 0415 04/21/20 1138  NA 131*   < > 157*   < > 159*  K 5.3*   < > 3.0*  --   --   CL 91*   < > 118*  --   --   CO2 25   < > 18*  --   --   GLUCOSE 121*   < > 86  --   --   BUN 34*   < > 11  --   --  CREATININE 0.44   < > 0.44  --   --   CALCIUM 9.6   < > 8.2*  --   --   MG  --   --  1.6*  --   --   AST 13*  --   --   --   --   ALT 8  --   --   --   --   ALKPHOS 72  --   --   --   --   BILITOT 0.9  --   --   --   --    < > = values in this interval not displayed.   Cardiac Enzymes No results for input(s): TROPONINI in the last 168 hours. RADIOLOGY:  DG Abd Portable 1V-Small Bowel Obstruction Protocol-24 hr delay  Result Date: 04/21/2020 CLINICAL DATA:  Small bowel obstruction EXAM: PORTABLE ABDOMEN - 1 VIEW COMPARISON:  Yesterday FINDINGS: Definite improvement in small bowel distension, although still diffusely and abnormally dilated up  to 3.4 cm. Some contrast has reached the rectum and been evacuated Enteric tube remains in good position. Stable pre-existing hardware. IMPRESSION: Improved small bowel obstruction with interval passage of contrast into colon. Electronically Signed   By: Marnee Spring M.D.   On: 04/21/2020 10:23   DG Abd Portable 1V-Small Bowel Obstruction Protocol-initial, 8 hr delay  Result Date: 04/20/2020 CLINICAL DATA:  Nausea, vomiting EXAM: PORTABLE ABDOMEN - 1 VIEW COMPARISON:  03/21/2020 FINDINGS: NG tube is in the stomach. Contrast material is seen within the stomach. Dilated small bowel loops are again noted in the abdomen and upper pelvis compatible with small bowel obstruction. No significant change since prior study. No organomegaly. IVC filter in place. IMPRESSION: Continued dilated small bowel loops compatible with small bowel obstruction. No change since prior study. Contrast material seen within the stomach presumably injected through the NG tube. Electronically Signed   By: Charlett Nose M.D.   On: 04/20/2020 19:14   DG Abd Portable 1V  Result Date: 04/20/2020 CLINICAL DATA:  Small bowel obstruction EXAM: PORTABLE ABDOMEN - 1 VIEW COMPARISON:  04/19/2020 FINDINGS: Enteric tube tip is in the left upper quadrant consistent with location in the upper stomach. Medication pump in the right lower quadrant. IVC filter. Surgical clips in the pelvis. Intrauterine device. Left lower quadrant ostomy. Gaseous distention of upper abdominal small bowel with decreased gas in the colon. Stool-filled rectum. Changes are consistent with small bowel obstruction. No significant improvement since prior study. Degenerative changes in the spine and hips. Vascular calcifications. IMPRESSION: Persistent gaseous distention of upper abdominal small bowel consistent with small bowel obstruction. Electronically Signed   By: Burman Nieves M.D.   On: 04/20/2020 05:11   [place] EKG SITE RITE  Result Date: 04/21/2020 If Site Rite  image not attached, placement could not be confirmed due to current cardiac rhythm.  ASSESSMENT AND PLAN:   [name] is a 54 y.o. 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  She also complains of diffuse abdominal pain.   Small bowel obstruction -continue NG tube -appreciate surgical input. Patient will need exploratory laparotomy. She failed conservative management. Surgery plans to take her tomorrow once her electrolytes is stable. -Patient presents to the ER for evaluation of 4-day history of abdominal pain mostly diffuse associated with nausea, vomiting and constipation. -CT abdomen  shows high-grade  small bowel obstruction as well as fecal impaction -N.p.o. -Supportive care with pain control, antiemetics, IV PPI and IV fluid hydration  Nutrition Status: severe malnutrition with failure to thrive, poor PO intake Nutrition Problem: Severe Malnutrition Etiology: chronic illness (COPD, quadriplegia) Signs/Symptoms: percent weight loss, severe fat depletion, severe muscle depletion Percent weight loss: 14 % Interventions: TPN  -patient will benefit from peg tube placement. Surgery has discussed with patient's son who is in agreement.  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 Patient has multiple comorbidities  DVT prophylaxis: Lovenox Code Status: Full code Family Communication:TOC is trying to find family contact info Disposition Plan:  to be determined Consults called: Surgery, palliative care  Status is: Inpatient  Remains inpatient appropriate because:Hemodynamically unstable, Persistent severe electrolyte disturbances, IV treatments appropriate due to intensity of illness or inability to take PO and Inpatient level of care appropriate due to severity of illness   Dispo: The patient is from: Home              Anticipated d/c is to: To be determined              Anticipated d/c date is: > 3 days              Patient currently is not medically stable to d/c.        TOTAL TIME TAKING CARE OF THIS PATIENT: 25 minutes.  >50% time spent on counselling and coordination of care  Note: This dictation was prepared with Dragon dictation along with smaller phrase technology. Any transcriptional errors that result from this process are unintentional.  Enedina Finner M.D    [facility]   CC: Primary care physician; [place], Julie A, MDPatient ID: [name], female   DOB: 26-May-1966, 54 y.o.   MRN: [id number]

## 2020-04-21 NOTE — Progress Notes (Signed)
PHARMACY CONSULT NOTE - FOLLOW UP  Pharmacy Consult for Electrolyte Monitoring and Replacement   Recent Labs: Potassium (mmol/L)  Date Value  04/21/2020 3.0 (L)   Magnesium (mg/dL)  Date Value  88/89/1694 1.6 (L)   Calcium (mg/dL)  Date Value  50/38/8828 8.2 (L)   Albumin (g/dL)  Date Value  00/34/9179 3.5   Sodium (mmol/L)  Date Value  04/21/2020 157 (H)   Corrected Ca: 8.6 mg/dL Add-On Phosphorous: 3.3 mg/dL  Assessment: 54 y.o. female with medical history significant for COPD, quadriplegia status post spinal cord injury and cystectomy with ileal conduit diversion who presents to the ER with SBO. She has been receiving 0.9% NaCl w/ 20 mEq KCl/L at 75 mL/hr for approximately 48 hours with little   Goal of Therapy:  Electrolytes WNL  Plan:   2 grams IV magnesium sulfate x 1   Hypernatremia: 0.9% NaCl w/ 20 mEq KCl/L changed to 5% dextrose at 100 mL/hr by attending physician  10 mEq IV KCl x 3  Re-check electrolytes in am  Lowella Bandy ,PharmD Clinical Pharmacist 04/21/2020 9:21 AM

## 2020-04-21 NOTE — Progress Notes (Addendum)
   04/21/20 0020  Assess: MEWS Score  Temp 98.4 F (36.9 C)  BP 117/66  Pulse Rate (!) 110  Resp 18  Level of Consciousness Responds to Voice  SpO2 97 %  O2 Device Room Air  Assess: MEWS Score  MEWS Temp 0  MEWS Systolic 0  MEWS Pulse 1  MEWS RR 0  MEWS LOC 1  MEWS Score 2  MEWS Score Color Yellow  Assess: if the MEWS score is Yellow or Red  Were vital signs taken at a resting state? Yes  Focused Assessment Change from prior assessment (see assessment flowsheet)  Early Detection of Sepsis Score *See Row Information* Low  MEWS guidelines implemented *See Row Information* Yes  Treat  Pain Scale PAINAD  Take Vital Signs  Increase Vital Sign Frequency  Yellow: Q 2hr X 2 then Q 4hr X 2, if remains yellow, continue Q 4hrs  Escalate  MEWS: Escalate Yellow: discuss with charge nurse/RN and consider discussing with provider and RRT  Notify: Charge Nurse/RN  Name of Charge Nurse/RN Notified Crystal B, RN  Date Charge Nurse/RN Notified 04/21/20  Time Charge Nurse/RN Notified 0045  Notify: Provider  Provider Name/Title Marikay Alar  Date Provider Notified 04/21/20  Time Provider Notified 0050  Notification Type Page  Notification Reason Change in status  Response No new orders  Date of Provider Response 04/21/20  Time of Provider Response 0056  Notify: Rapid Response  Name of Rapid Response RN Notified  (Not notified)  Document  Patient Outcome Not stable and remains on department  Yellow MEWS score protocol has been started. MD has been notified. Blood glucose check as part of the intervention order for the patient. Current blood glucose level is 82. Heart rate has increased from being in the 90s to know in the 110s. She was Aox2 now Disoriented x4.  Very lethargic at this time.  Temp is 98.4, P 110, R 20, BP 117/66, O2 97% on room air. Charge nurse and Nurse tech have been notified of changes and yellow MEWS score protocol. MD has seen the patient in the room and the patient is  currently being monitored at this time.

## 2020-04-21 NOTE — Progress Notes (Signed)
[facility] Liaison Note:  New referral for [employer] community Palliative program to follow post discharge received from Palliative NP Harvest Dark.  TOC Stephanie Bowen made aware. Patient information sent to referral. Thank you. Will follow for discharge disposition  Dayna Barker BSN, RN, [facility] [employer] [phone]

## 2020-04-21 NOTE — Progress Notes (Addendum)
Initial Nutrition Assessment  DOCUMENTATION CODES:   Severe malnutrition in context of chronic illness  INTERVENTION:   TPN per pharmacy   Recommend add thiamine 160m daily x 3 days to TPN  Pt at high refeed risk; recommend monitor potassium, magnesium and phosphorus labs daily until stable  NUTRITION DIAGNOSIS:   Severe Malnutrition related to chronic illness (COPD, quadriplegia) as evidenced by 14 percent weight loss in 4 months, severe fat depletion, severe muscle depletion.  GOAL:   Patient will meet greater than or equal to 90% of their needs  MONITOR:   Diet advancement, Labs, Weight trends, Skin, I & O's, Other (Comment) (TPN)  REASON FOR ASSESSMENT:   Consult New TPN/TNA  ASSESSMENT:   54y.o. 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  Met with pt in room today. Pt lethargic and unable to provide any history. Per chart review, pt reports poor appetite and oral intake for 4 days pta r/t nausea, vomiting and abdominal pain. RD suspects pt with poor appetite and oral intake at baseline as pt is severely malnourished. Pt noted to have multiple wounds. Per chart, pt is down 12lbs(14%) since June; this is significant. Pt's UBW appears to be ~93lbs. Pt is NPO. NGT in place with 2561moutput. Plan is for exploratory laparotomy today or tomorrow. Will initiate TPN this evening. PICC line pending. Would recommend consideration of G-tube placement for long term nutrition support as pt is malnourished with numerous wounds. Pt would benefit from nutrition support to support wound healing and to help prevent further wounds from forming in the future. Pt is at high refeed risk.   Medications reviewed and include: lovenox, NaCl w/ KCl _0 /hr, Mg sulfate, zosyn  Labs reviewed: Na 157(H), K 3.0(L), Mg 1.6(L) Wbc- 19.3(H), Hgb 9.4(L), Hct 30.5(L)  NUTRITION -  FOCUSED PHYSICAL EXAM:    Most Recent Value  Orbital Region Severe depletion  Upper Arm Region Severe depletion  Thoracic and Lumbar Region Severe depletion  Buccal Region Severe depletion  Temple Region Severe depletion  Clavicle Bone Region Severe depletion  Clavicle and Acromion Bone Region Severe depletion  Scapular Bone Region Severe depletion  Dorsal Hand Severe depletion  Patellar Region Severe depletion  Anterior Thigh Region Severe depletion  Posterior Calf Region Severe depletion  Edema (RD Assessment) None  Hair Reviewed  Eyes Reviewed  Mouth Reviewed  Skin Reviewed  Nails Reviewed     Diet Order:   Diet Order            Diet NPO time specified  Diet effective now                EDUCATION NEEDS:   Not appropriate for education at this time  Skin:  Skin Assessment: Reviewed RN Assessment (Left heel: 0.1cm x 0.2cm x 0.1cm, Right heel: healed, Right IT: Stage 3 with 2cm x 3.5cm x 0.1cm pink wound in the center of a 5cm x 4cm previous wound (now scar tissue); undermining measuring 0.6cm at distal margin, Sacrum: 6.5cm x 15cm x 0.3cm)  Last BM:  10/19  Height:   Ht Readings from Last 1 Encounters:  04/21/20 _1  (1.549 m)    Weight:   Wt Readings from Last 1 Encounters:  04/21/20 33.9 kg    Ideal Body Weight:  38 kg (adjusted for quadraplegia)  BMI:  Body mass index is 14.12 kg/m.  Estimated Nutritional Needs:  Kcal:  1200-1400kcal/day  Protein:  60-70g/day  Fluid:  1-1.2L/day  Koleen Distance MS, RD, LDN Please refer to [facility] for RD and/or RD on-call/weekend/after hours pager

## 2020-04-22 ENCOUNTER — Inpatient Hospital Stay: Payer: Medicare Other

## 2020-04-22 ENCOUNTER — Encounter: Admission: EM | Disposition: E | Payer: Self-pay | Source: Home / Self Care | Attending: Internal Medicine

## 2020-04-22 ENCOUNTER — Encounter: Payer: Self-pay | Admitting: Anesthesiology

## 2020-04-22 DIAGNOSIS — G9341 Metabolic encephalopathy: Secondary | ICD-10-CM

## 2020-04-22 DIAGNOSIS — Z515 Encounter for palliative care: Secondary | ICD-10-CM | POA: Diagnosis not present

## 2020-04-22 DIAGNOSIS — Z7189 Other specified counseling: Secondary | ICD-10-CM | POA: Diagnosis not present

## 2020-04-22 DIAGNOSIS — G9340 Encephalopathy, unspecified: Secondary | ICD-10-CM

## 2020-04-22 DIAGNOSIS — E43 Unspecified severe protein-calorie malnutrition: Secondary | ICD-10-CM | POA: Diagnosis not present

## 2020-04-22 DIAGNOSIS — L89153 Pressure ulcer of sacral region, stage 3: Secondary | ICD-10-CM | POA: Diagnosis not present

## 2020-04-22 DIAGNOSIS — K56609 Unspecified intestinal obstruction, unspecified as to partial versus complete obstruction: Secondary | ICD-10-CM | POA: Diagnosis not present

## 2020-04-22 LAB — MAGNESIUM: Magnesium: 2 mg/dL (ref 1.7–2.4)

## 2020-04-22 LAB — CBC
HCT: 28.9 % — ABNORMAL LOW (ref 36.0–46.0)
Hemoglobin: 9.4 g/dL — ABNORMAL LOW (ref 12.0–15.0)
MCH: 28.4 pg (ref 26.0–34.0)
MCHC: 32.5 g/dL (ref 30.0–36.0)
MCV: 87.3 fL (ref 80.0–100.0)
Platelets: 465 10*3/uL — ABNORMAL HIGH (ref 150–400)
RBC: 3.31 MIL/uL — ABNORMAL LOW (ref 3.87–5.11)
RDW: 16.2 % — ABNORMAL HIGH (ref 11.5–15.5)
WBC: 15.8 10*3/uL — ABNORMAL HIGH (ref 4.0–10.5)
nRBC: 0.6 % — ABNORMAL HIGH (ref 0.0–0.2)

## 2020-04-22 LAB — COMPREHENSIVE METABOLIC PANEL
ALT: 6 U/L (ref 0–44)
AST: 13 U/L — ABNORMAL LOW (ref 15–41)
Albumin: 2.4 g/dL — ABNORMAL LOW (ref 3.5–5.0)
Alkaline Phosphatase: 61 U/L (ref 38–126)
Anion gap: 9 (ref 5–15)
BUN: 10 mg/dL (ref 6–20)
CO2: 24 mmol/L (ref 22–32)
Calcium: 7.5 mg/dL — ABNORMAL LOW (ref 8.9–10.3)
Chloride: 118 mmol/L — ABNORMAL HIGH (ref 98–111)
Creatinine, Ser: 0.37 mg/dL — ABNORMAL LOW (ref 0.44–1.00)
GFR, Estimated: >60 mL/min (ref >60)
Glucose, Bld: 412 mg/dL — ABNORMAL HIGH (ref 70–99)
Potassium: 2.9 mmol/L — ABNORMAL LOW (ref 3.5–5.1)
Sodium: 151 mmol/L — ABNORMAL HIGH (ref 135–145)
Total Bilirubin: 0.5 mg/dL (ref 0.3–1.2)
Total Protein: 5.9 g/dL — ABNORMAL LOW (ref 6.5–8.1)

## 2020-04-22 LAB — BASIC METABOLIC PANEL
Anion gap: 11 (ref 5–15)
Anion gap: 12 (ref 5–15)
BUN: 10 mg/dL (ref 6–20)
BUN: 10 mg/dL (ref 6–20)
CO2: 24 mmol/L (ref 22–32)
CO2: 22 mmol/L (ref 22–32)
Calcium: 7.7 mg/dL — ABNORMAL LOW (ref 8.9–10.3)
Calcium: 7.3 mg/dL — ABNORMAL LOW (ref 8.9–10.3)
Chloride: 113 mmol/L — ABNORMAL HIGH (ref 98–111)
Chloride: 111 mmol/L (ref 98–111)
Creatinine, Ser: 0.33 mg/dL — ABNORMAL LOW (ref 0.44–1.00)
Creatinine, Ser: <0.3 mg/dL — ABNORMAL LOW (ref 0.44–1.00)
GFR, Estimated: >60 mL/min (ref >60)
Glucose, Bld: 393 mg/dL — ABNORMAL HIGH (ref 70–99)
Glucose, Bld: 243 mg/dL — ABNORMAL HIGH (ref 70–99)
Potassium: 2.9 mmol/L — ABNORMAL LOW (ref 3.5–5.1)
Potassium: 3.5 mmol/L (ref 3.5–5.1)
Sodium: 148 mmol/L — ABNORMAL HIGH (ref 135–145)
Sodium: 145 mmol/L (ref 135–145)

## 2020-04-22 LAB — GLUCOSE, CAPILLARY
Glucose-Capillary: 137 mg/dL — ABNORMAL HIGH (ref 70–99)
Glucose-Capillary: 154 mg/dL — ABNORMAL HIGH (ref 70–99)
Glucose-Capillary: 162 mg/dL — ABNORMAL HIGH (ref 70–99)
Glucose-Capillary: 196 mg/dL — ABNORMAL HIGH (ref 70–99)
Glucose-Capillary: 342 mg/dL — ABNORMAL HIGH (ref 70–99)
Glucose-Capillary: 350 mg/dL — ABNORMAL HIGH (ref 70–99)
Glucose-Capillary: 368 mg/dL — ABNORMAL HIGH (ref 70–99)

## 2020-04-22 LAB — PREALBUMIN: Prealbumin: 5.9 mg/dL — ABNORMAL LOW (ref 18–38)

## 2020-04-22 LAB — PHOSPHORUS
Phosphorus: 1.3 mg/dL — ABNORMAL LOW (ref 2.5–4.6)
Phosphorus: 3.7 mg/dL (ref 2.5–4.6)

## 2020-04-22 LAB — DIFFERENTIAL
Abs Immature Granulocytes: 1.71 10*3/uL — ABNORMAL HIGH (ref 0.00–0.07)
Basophils Absolute: 0.1 10*3/uL (ref 0.0–0.1)
Basophils Relative: 1 %
Eosinophils Absolute: 0 10*3/uL (ref 0.0–0.5)
Eosinophils Relative: 0 %
Immature Granulocytes: 11 %
Lymphocytes Relative: 8 %
Lymphs Abs: 1.2 10*3/uL (ref 0.7–4.0)
Monocytes Absolute: 0.9 10*3/uL (ref 0.1–1.0)
Monocytes Relative: 6 %
Neutro Abs: 11.9 10*3/uL — ABNORMAL HIGH (ref 1.7–7.7)
Neutrophils Relative %: 74 %
Smear Review: NORMAL

## 2020-04-22 SURGERY — LAPAROTOMY, EXPLORATORY
Anesthesia: General

## 2020-04-22 MED ORDER — SODIUM CHLORIDE 0.45 % IV SOLN: INTRAVENOUS | Status: DC

## 2020-04-22 MED ORDER — SODIUM CHLORIDE 0.9 % IV SOLN
2.0000 g | Freq: Two times a day (BID) | INTRAVENOUS | Status: DC
  Administered 2020-04-22 – 2020-04-28 (×12): 2 g via INTRAVENOUS
  Filled 2020-04-22 (×2): qty 2
  Filled 2020-04-22 (×3): qty 20
  Filled 2020-04-22: qty 2
  Filled 2020-04-22 (×3): qty 20
  Filled 2020-04-22: qty 2
  Filled 2020-04-22 (×2): qty 20
  Filled 2020-04-22 (×3): qty 2
  Filled 2020-04-22 (×2): qty 20

## 2020-04-22 MED ORDER — POTASSIUM PHOSPHATES 15 MMOLE/5ML IV SOLN
30.0000 mmol | Freq: Once | INTRAVENOUS | Status: AC
  Administered 2020-04-22: 30 mmol via INTRAVENOUS
  Filled 2020-04-22: qty 10

## 2020-04-22 MED ORDER — TRAVASOL 10 % IV SOLN
INTRAVENOUS | Status: AC
  Filled 2020-04-22: qty 249.6

## 2020-04-22 MED ORDER — VANCOMYCIN HCL 750 MG/150ML IV SOLN
750.0000 mg | INTRAVENOUS | Status: DC
  Administered 2020-04-22: 750 mg via INTRAVENOUS
  Filled 2020-04-22 (×3): qty 150

## 2020-04-22 MED ORDER — DEXTROSE 5 % IV SOLN: INTRAVENOUS | Status: DC

## 2020-04-22 MED ORDER — SODIUM CHLORIDE 0.9 % IV SOLN
2.0000 g | Freq: Four times a day (QID) | INTRAVENOUS | Status: DC
  Administered 2020-04-22 – 2020-04-28 (×24): 2 g via INTRAVENOUS
  Filled 2020-04-22: qty 2000
  Filled 2020-04-22: qty 2
  Filled 2020-04-22 (×3): qty 2000
  Filled 2020-04-22: qty 2
  Filled 2020-04-22: qty 2000
  Filled 2020-04-22: qty 2
  Filled 2020-04-22: qty 2000
  Filled 2020-04-22: qty 2
  Filled 2020-04-22: qty 2000
  Filled 2020-04-22: qty 2
  Filled 2020-04-22 (×4): qty 2000
  Filled 2020-04-22: qty 2
  Filled 2020-04-22 (×2): qty 2000
  Filled 2020-04-22: qty 2
  Filled 2020-04-22: qty 2000
  Filled 2020-04-22: qty 2
  Filled 2020-04-22 (×5): qty 2000
  Filled 2020-04-22: qty 2

## 2020-04-22 SURGICAL SUPPLY — 32 items
APPLICATOR CHLORAPREP 10.5 ORG (MISCELLANEOUS) ×3 IMPLANT
CANISTER SUCT 1200ML W/VALVE (MISCELLANEOUS) ×3 IMPLANT
COVER WAND RF STERILE (DRAPES) ×3 IMPLANT
DRAPE LAPAROTOMY 100X77 ABD (DRAPES) ×3 IMPLANT
DRSG OPSITE POSTOP 4X12 (GAUZE/BANDAGES/DRESSINGS) ×3 IMPLANT
DRSG TEGADERM 4X10 (GAUZE/BANDAGES/DRESSINGS) ×3 IMPLANT
ELECT CAUTERY BLADE 6.4 (BLADE) ×3 IMPLANT
ELECT REM PT RETURN 9FT ADLT (ELECTROSURGICAL) ×3
ELECTRODE REM PT RTRN 9FT ADLT (ELECTROSURGICAL) ×1 IMPLANT
GAUZE SPONGE 4X4 12PLY STRL (GAUZE/BANDAGES/DRESSINGS) ×3 IMPLANT
GLOVE SURG SYN 7.0 (GLOVE) ×6 IMPLANT
GLOVE SURG SYN 7.5  E (GLOVE) ×4
GLOVE SURG SYN 7.5 E (GLOVE) ×2 IMPLANT
GOWN STRL REUS W/ TWL LRG LVL3 (GOWN DISPOSABLE) ×4 IMPLANT
GOWN STRL REUS W/TWL LRG LVL3 (GOWN DISPOSABLE) ×8
LABEL OR SOLS (LABEL) ×3 IMPLANT
LIGASURE IMPACT 36 18CM CVD LR (INSTRUMENTS) ×3 IMPLANT
NEEDLE HYPO 22GX1.5 SAFETY (NEEDLE) ×3 IMPLANT
NS IRRIG 1000ML POUR BTL (IV SOLUTION) ×3 IMPLANT
PACK BASIN MAJOR ARMC (MISCELLANEOUS) ×3 IMPLANT
PACK COLON CLEAN CLOSURE (MISCELLANEOUS) ×3 IMPLANT
SEPRAFILM MEMBRANE 5X6 (MISCELLANEOUS) IMPLANT
STAPLER SKIN PROX 35W (STAPLE) ×3 IMPLANT
SUT PDS AB 1 CT1 36 (SUTURE) ×3 IMPLANT
SUT PROLENE 2 0 SH DA (SUTURE) ×3 IMPLANT
SUT SILK 2 0 (SUTURE) ×2
SUT SILK 2-0 18XBRD TIE 12 (SUTURE) ×1 IMPLANT
SUT SILK 3-0 (SUTURE) ×3 IMPLANT
SUT VIC AB 3-0 SH 27 (SUTURE) ×2
SUT VIC AB 3-0 SH 27X BRD (SUTURE) ×1 IMPLANT
SYR 10ML LL (SYRINGE) ×3 IMPLANT
TRAY FOLEY MTR SLVR 16FR STAT (SET/KITS/TRAYS/PACK) ×3 IMPLANT

## 2020-04-22 NOTE — Progress Notes (Signed)
Daily Progress Note   Patient Name: [name]       Date: 2020-05-05 DOB: [birth year]  Age: 54 y.o. MRN#: [id number] Attending Physician: Enedina Finner, MD Primary Care Physician: [place], Julie A, MD Admit Date: 04/15/2020  Reason for Consultation/Follow-up: Establishing goals of care  Subjective: Opens eyes to voice, does not follow commands.  Length of Stay: 4  Current Medications: Scheduled Meds:  . Chlorhexidine Gluconate Cloth  6 each Topical Daily  . enoxaparin (LOVENOX) injection  40 mg Subcutaneous Q24H  . fluticasone  2 spray Each Nare Daily  . fluticasone  2 puff Inhalation BID  . influenza vac split quadrivalent PF  0.5 mL Intramuscular Tomorrow-1000  . insulin aspart  0-6 Units Subcutaneous Q4H  . tiotropium  18 mcg Inhalation Daily    Continuous Infusions: . sodium chloride 80 mL/hr at May 05, 2020 1044  . piperacillin-tazobactam (ZOSYN)  IV 12.5 mL/hr at 05-05-20 0530  . potassium PHOSPHATE IVPB (in mmol) 30 mmol (05/05/20 1101)  . TPN ADULT (ION) 20 mL/hr at 2020-05-05 0530  . TPN ADULT (ION)      PRN Meds: ondansetron **OR** ondansetron (ZOFRAN) IV, phenol, sodium chloride flush  Physical Exam Constitutional:      Comments: Frail, thin, temporal wasting somnolent  Pulmonary:     Effort: Pulmonary effort is normal. No respiratory distress.  Abdominal:     Palpations: Abdomen is soft.             Vital Signs: BP (!) 131/57 (BP Location: Left Arm)   Pulse (!) 55   Temp 98.3 F (36.8 C) (Axillary)   Resp 16   Ht 5\' 1"  (1.549 m)   Wt 33.9 kg   SpO2 100%   BMI 14.12 kg/m  SpO2: SpO2: 100 % O2 Device: O2 Device: Room Air O2 Flow Rate:    Intake/output summary:   Intake/Output Summary (Last 24 hours) at 2020/05/05 1446 Last data filed at 05/05/20  0530 Gross per 24 hour  Intake 1808.32 ml  Output 1350 ml  Net 458.32 ml   LBM: Last BM Date: 05/05/20 Baseline Weight: Weight: 33.9 kg Most recent weight: Weight: 33.9 kg       Palliative Assessment/Data: PPS 10%    Flowsheet Rows     Most Recent Value  Intake Tab  Referral Department Hospitalist  Unit at Time of Referral Med/Surg Unit  Palliative Care Primary Diagnosis Other (Comment)  [SBO]  Date Notified 04/21/20  Palliative Care Type New Palliative care  Reason for referral Clarify Goals of Care  Date of Admission 04/09/2020  Date first seen by Palliative Care 04/21/20  # of days Palliative referral response time 0 Day(s)  # of days IP prior to Palliative referral 3  Clinical Assessment  Palliative Performance Scale Score 10%  Psychosocial & Spiritual Assessment  Palliative Care Outcomes  Patient/Family meeting held? Yes  Who was at the meeting? son  Palliative Care Outcomes Clarified goals of care, Provided psychosocial or spiritual support, Linked to palliative care logitudinal support      Patient Active Problem List   Diagnosis Date Noted  . Protein-calorie malnutrition, severe 04/21/2020  . Goals of care, counseling/discussion   . Palliative care by specialist   .  Hypokalemia   . Leukocytosis   . SBO (small bowel obstruction) (HCC) 04/28/2020  . Sacral decubitus ulcer, stage III (HCC) 05/01/2020  . Pressure ulcer of back 04/08/2020  . COPD (chronic obstructive pulmonary disease) (HCC)   . History of urostomy   . Paraplegic spinal paralysis (HCC)   . Urinary tract infection without hematuria   . Pneumonia 09/07/2016  . Pressure injury of skin 09/07/2016    Palliative Care Assessment & Plan   HPI: 54 y.o. female  with past medical history of COPD, quadriplegia s/p spinal cord injury, and cystectomy with ileal conduit diversion admitted on 04/28/2020 with N/V x4 days and abd pain. CT of abd revealed high grade SBO.  Plan for ex lap and ?PEG placement  10/21 - cancelled d/t AMS and abnormal labs, improving abdomen. TPN will be initiated 10/20. PMT consulted for GOC.  Assessment: Spoke with Everlean Alstrom - we reviewed patient's condition. All questions and concerns addressed.  He is concerned about long-term prognosis. He is also concerned about continued AMS. He remains interested in all aggressive medical interventions offered. He plans to be at the hospital later today to visit patient.   Recommendations/Plan:  Full code/full scope  Outpatient palliative to follow after discharge  Will follow  Code Status:  Full code  Prognosis:   Unable to determine  Discharge Planning:  To Be Determined  Care plan was discussed with Dr. Allena Katz and patient's son  Thank you for allowing the Palliative Medicine Team to assist in the care of this patient.   Total Time 20 minutes Prolonged Time Billed  no       Greater than 50%  of this time was spent counseling and coordinating care related to the above assessment and plan.  Gerlean Ren, DNP, [facility] Palliative Medicine Team Team Phone # [phone]  Pager [phone]

## 2020-04-22 NOTE — Progress Notes (Signed)
[facility] SURGICAL PROGRESS NOTE  Hospital Day(s): 4.   Interval History:  Patient seen and examined no acute events or new complaints overnight.  Patient remains very somnolent, only briefly opens her eyes Leukocytosis mildly improved, down to 15.8K sCr remains normal at 0.37, UO - 1.6 L She continues to have hypernatremia to 151 Hypokalemic this morning to 2.9, hypophosphatemic to 1.3 NGT output with minimal output recorded She has reportedly had multiple stools in the last 24 hours  Review of Systems:  Unable to reliable to preform secondary to somnolent  Vital signs in last 24 hours: [min-max] current  Temp:  [97.5 F (36.4 C)-98.6 F (37 C)] 98.6 F (37 C) (10/21 0514) Pulse Rate:  [64-97] 76 (10/21 0514) Resp:  [12-20] 14 (10/21 0514) BP: (89-150)/(49-71) 130/61 (10/21 0514) SpO2:  [96 %-100 %] 97 % (10/21 0514)     Height: 5\' 1"  (154.9 cm) Weight: 33.9 kg     Intake/Output last 2 shifts:  10/20 0701 - 10/21 0700 In: 2125.8 [I.V.:1722.3; IV Piggyback:403.6] Out: 1600 [Urine:1600]   Physical Exam:  Constitutional: somnolent, briefly opens eyes to verbal stimuli HENT: normocephalic without obvious abnormality, NGT in place Eyes: PERRL, EOM's grossly intact and symmetric  Respiratory: breathing non-labored at rest  Cardiovascular: regular rate and sinus rhythm  Gastrointestinal:Soft,unable to reliably assess tenderness, distension improved, no rebound/guarding, urostomy in place in LLQ.Patient has on the right lower quadrant subcutaneous baclofen pump Musculoskeletal:Chronic atrophy secondary to history of paraplegia   Labs:  CBC Latest Ref Rng & Units 04/13/2020 04/21/2020 04/19/2020  WBC 4.0 - 10.5 K/uL 15.8(H) 19.3(H) 17.9(H)  Hemoglobin 12.0 - 15.0 g/dL 04/21/2020) 1.9(J) 0.9(T)  Hematocrit 36 - 46 % 28.9(L) 30.5(L) 31.1(L)  Platelets 150 - 400 K/uL 465(H) 469(H) 407(H)   CMP Latest Ref Rng & Units 04/16/2020 04/21/2020 04/21/2020  Glucose  70 - 99 mg/dL 04/23/2020) 124(P) -  BUN 6 - 20 mg/dL 10 8 -  Creatinine 809(X - 1.00 mg/dL 8.33) 8.25(K -  Sodium 135 - 145 mmol/L 151(H) 157(H) 159(H)  Potassium 3.5 - 5.1 mmol/L 2.9(L) 3.2(L) -  Chloride 98 - 111 mmol/L 118(H) 120(H) -  CO2 22 - 32 mmol/L 24 16(L) -  Calcium 8.9 - 10.3 mg/dL 7.5(L) 8.1(L) -  Total Protein 6.5 - 8.1 g/dL 5.9(L) - -  Total Bilirubin 0.3 - 1.2 mg/dL 0.5 - -  Alkaline Phos 38 - 126 U/L 61 - -  AST 15 - 41 U/L 13(L) - -  ALT 0 - 44 U/L 6 - -     Imaging studies:   KUB (04/15/2020) personally reviewed which shows decrease in small bowel distension and contrast in colon, overall improved, and radiologist report reviewed:  IMPRESSION: Oral contrast within large and small bowel. Decreased caliber of small bowel loops.   Assessment/Plan: (ICD-10's: K45.609) 54 y.o. female with numerous electrolyte derangements but clinical and radiologic improvement small bowel obstruction, most likely secondary to post-surgical adhesions, complicated by history of paraplegia, malnutrition, deconditioning.   - Given clinical and radiographic improvement concomitant with electrolyte derangements and AMS, we will hold off on pursuing surgical intervention at this time. We can consider PEG tube placement moving forward as well pending clinical condition. Also recommend obtaining CT Head to ensure she has had no degree of herniation given hypernatremia.    - Continue TPN; 1/2 rate for now, monitor and replete electrolyte derangements prior to advancing  - Continue NPO + IVF resuscitation  - Continue with NGT decompression; LIS; monitor and  record output - Monitor abdominal examination; on-going bowel function - Pain control prn (minimize narcotics if feasible); antiemetics prn - Serial KUBs as needed - Monitor leukocytosis; improved; unsure source  - Further management per primary service; we will follow   All of the above  findings and recommendations were discussed with the medical team. No family at bedside.   -- Lynden Oxford, PA-C [facility] 04/02/2020, 7:16 AM [phone] M-F: 7am - 4pm
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
## 2020-04-22 NOTE — Progress Notes (Signed)
PHARMACY CONSULT NOTE  Pharmacy Consult for Electrolyte Monitoring and Replacement   Recent Labs: Potassium (mmol/L)  Date Value  04/06/2020 2.9 (L)   Magnesium (mg/dL)  Date Value  09/32/6712 2.0   Calcium (mg/dL)  Date Value  45/80/9983 7.5 (L)   Albumin (g/dL)  Date Value  38/25/0539 2.4 (L)   Phosphorus (mg/dL)  Date Value  76/73/4193 1.3 (L)   Sodium (mmol/L)  Date Value  04/14/2020 151 (H)   Corrected Ca: 8.78 mg/dL  Assessment: 54 y.o. female with medical history significant for COPD, quadriplegia status post spinal cord injury and cystectomy with ileal conduit diversion who presents to the ER with SBO.   Goal of Therapy:  Electrolytes WNL  Plan:   30 mmol IV potassium phosphate (this provides 44 mEq IV potassium) x 1   Hypernatremia: MIVF changed to 0.45% NaCl at 80 mL/hr by attending physician  Re-check electrolytes 1 hour following potassium phosphate infusion  Lowella Bandy ,PharmD Clinical Pharmacist 04/21/2020 10:11 AM

## 2020-04-22 NOTE — Consult Note (Signed)
PHARMACY - TOTAL PARENTERAL NUTRITION CONSULT NOTE   Indication: bowel obstruction  Patient Measurements: Height: 5\' 1"  (154.9 cm) Weight: 33.9 kg (74 lb 11.8 oz) IBW/kg (Calculated) : 47.8   Body mass index is 14.12 kg/m.  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   Glucose / Insulin: BG 154 - 412 Electrolytes: hypokalemia, hypophosphotemia Renal:  SCr<1, stable LFTs / TGs: LFTs wnl at baseline Prealbumin / albumin: pending / 3.5 g/dL Intake / Output; MIVF:  10/20 0701 - 10/21 0700 In: 2125.8 [I.V.:1722.3; IV Piggyback:403.6] Out: 1600 [Urine:1600]  5% dextrose w/ 40 mEq KCl/L at 80 mL/hr  GI Imaging: 10/21: decreased caliber of small bowel loops Surgeries / Procedures:   Central access: 04/21/20  TPN start date: 04/21/20   Nutritional Goals (per RD recommendation on 04/21/20): kCal: 1200 - 1400 kCal/day, Protein: 60 - 70 g/day, Fluid: 1000 - 1200 mL/day  Current Nutrition:  NPO  Plan:   Continue TPN at 20 mL/hr due to continued electrolyte derangements (total volume 580 mL)  Nutritional Components:  Amino Acids (10%): 52 g/L (24.96 g)  Dextrose: 15.3 % (73.4g)  Lipids (as intralipid): 31.3 g/L (15 g)  Calories: 499.74 kCal   Electrolytes in TPN: 53mEq/L of Na, 43mEq/L of K, 57mEq/L of Ca, 31mEq/L of Mg, and 40mmol/L of Phos. Cl:Ac ratio 1:1  30 mmol IV potassium phosphate x 1 (this provides 44 mEq IV potassium)  Add standard MVI, 100 mg thiamine and trace elements to TPN  Continue very sensitive q6h SSI and adjust as needed   Due to hyperglycemia MIVF changed to 0.45% NaCl at 80 mL/hr   Monitor TPN labs on Mon/Thurs, daily until stable  12m 04/17/2020,6:58 AM

## 2020-04-22 NOTE — Progress Notes (Signed)
Pharmacy Antibiotic Note  [name] is a 54 y.o. female with PMH for COPD, quadriplegia s/p spinal cord injury and cystectomy with ileal conduit diversion who was admitted on 04/07/2020 with complaints of N/V x4 days; patient was admitted for SBO. Neurology consulted. Patient was to get LP 10/21 however it was cancelled due to pressure sore extended to mid back and patient having pain pump. Pharmacy has been consulted for Vancomycin, ceftriaxone, and ampicillin dosing for meningitis.  10/21 CT head: no acute finding or change from 2009 Day 5 total IV abx; WBC 17.9>19.3>15.8  Plan: Will give loading dose of vancomycin 750mg  IV and continue 750mg  IV q24 hours per dosing nomogram --Obtain vanc level prior to 4th or 5th dose Start ampicillin 2g IV q6hrs  Monitor renal function and adjust dose as clinically indicated   Start Ceftriaxone 2g IV q12hrs  Height: 5\' 1"  (154.9 cm) Weight: 33.9 kg (74 lb 11.8 oz) IBW/kg (Calculated) : 47.8  Temp (24hrs), Avg:98.2 F (36.8 C), Min:97 F (36.1 C), Max:99.1 F (37.3 C)  Recent Labs  Lab 04/04/2020 1109 04/15/2020 1109 04/28/2020 1408 04/19/20 0443 04/21/20 0415 04/21/20 1138 04/21/20 1929 04/05/2020 0542 04/26/2020 1036  WBC 5.6  --   --  17.9* 19.3*  --   --  15.8*  --   CREATININE 0.44   < >  --  <0.30* 0.44  --  0.55 0.37* 0.33*  LATICACIDVEN  --   --  0.6  --   --  0.9  --   --   --    < > = values in this interval not displayed.    Estimated Creatinine Clearance: 43 mL/min (A) (by C-G formula based on SCr of 0.33 mg/dL (L)).    Allergies  Allergen Reactions  . Shellfish Allergy Shortness Of Breath    Antimicrobials this admission: 10/17 Ceftriaxone >>10/20 10/20 Zosyn >> 10/21 10/21 Ceftriaxone >>  10/21 Vancomycin >> 10/21 Ampicillin >>   Microbiology results: 10/17 UCx: multiple species   Thank you for allowing pharmacy to be a part of this patient's care.   11/21, PharmD Pharmacy Resident   04/19/2020 5:08 PM

## 2020-04-22 NOTE — Progress Notes (Signed)
Patient ID: [name], female   DOB: [birth year], 54 y.o.   MRN: [id number] called son [name] and spoke with him regarding CT scan results. Neurology consultation placed. Await further recommendations. In the meantime continue electrolyte replacement due to significant re-feeding syndrome.

## 2020-04-22 NOTE — Progress Notes (Addendum)
Patient ID: [name], female   DOB: 1966-06-22, 54 y.o.   MRN: [id number]  Spoke with Dr Otelia Limes-- recommends get LP under fluoroscopy. Dr. Otelia Limes to order antibiotics to cover bacterial meningitis given sudden change in mental status spoke with Dr. register interventional radiology and will get LP done today.  4:55 pm : spoke with Dr. Register who informs difficult to do LP with pressure sore extended to mid back and patient having pain pump. LP canceled. Spoke with pharmacist to start empiric treatment for meningitis as per recommended by neurology.

## 2020-04-22 NOTE — Progress Notes (Addendum)
[facility]  - [place] at [facility]   PATIENT NAME: [name]    MR#:  [id number]  DATE OF BIRTH:  June 12, 1966  SUBJECTIVE:  patient barely able to keep eyes open or hold meaningful conversation. According to RN two days ago patient was very talkative. No family at bedside. Spoke with son [name] on the phone. He reports mom recognizing his voice and looking at him when called on yesterday evening.  NG output reduced. RN reporting pulling out phlegm chunks from back of the throat. Unable to suction since secretions are thick REVIEW OF SYSTEMS:   Review of Systems  Unable to perform ROS: Mental status change   Tolerating Diet:npo Tolerating PT: bedbound due to functional quadriplegia  DRUG ALLERGIES:   Allergies  Allergen Reactions  . Shellfish Allergy Shortness Of Breath    VITALS:  Blood pressure 134/63, pulse 61, temperature 99.1 F (37.3 C), temperature source Oral, resp. rate 14, height 5\' 1"  (1.549 m), weight 33.9 kg, SpO2 98 %.  PHYSICAL EXAMINATION:   Physical Exam limited exam secondary to mental status change  GENERAL:  54 y.o.-year-old patient lying in the bed with no acute distress. Thin cachectic severely malnourished EYES: sunken eyeballs, pupils dilated--sluggish rxn to light HEENT: Head atraumatic, normocephalic. Oropharynx and nasopharynx Drive poor dentition LUNGS: Normal breath sounds bilaterally, no wheezing, rales, rhonchi. No use of accessory muscles of respiration. No respiratory distress CARDIOVASCULAR: S1, S2 normal. No murmurs, rubs, or gallops.  ABDOMEN: Soft, nontender, nondistended. urostomy bag +  EXTREMITIES: thin atrophied with contractors NEUROLOGIC: lethargic. Unable to assess  PSYCHIATRIC:  patient is lethargic SKIN:   Pressure Injury 04/23/2020 Sacrum Stage 3 -  Full thickness tissue loss. Subcutaneous fat may be visible but bone, tendon or muscle are NOT exposed. (Active)  04/09/2020 1433  Location: Sacrum   Location Orientation:   Staging: Stage 3 -  Full thickness tissue loss. Subcutaneous fat may be visible but bone, tendon or muscle are NOT exposed.  Wound Description (Comments):   Present on Admission: Yes     Pressure Injury 04/14/2020 Back Lateral;Left;Upper Stage 2 -  Partial thickness loss of dermis presenting as [name] shallow open injury with [name] red, pink wound bed without slough. 1.75cm x 2cm (Active)  05/01/2020 1923  Location: Back  Location Orientation: Lateral;Left;Upper  Staging: Stage 2 -  Partial thickness loss of dermis presenting as [name] shallow open injury with [name] red, pink wound bed without slough.  Wound Description (Comments): 1.75cm x 2cm  Present on Admission: Yes     Pressure Injury 04/04/2020 Buttocks Right Stage 3 -  Full thickness tissue loss. Subcutaneous fat may be visible but bone, tendon or muscle are NOT exposed. 5.5cm x 4.5cm x 0.75cm (Active)  04/11/2020 1924  Location: Buttocks  Location Orientation: Right  Staging: Stage 3 -  Full thickness tissue loss. Subcutaneous fat may be visible but bone, tendon or muscle are NOT exposed.  Wound Description (Comments): 5.5cm x 4.5cm x 0.75cm  Present on Admission: Yes   LABORATORY PANEL:  CBC Recent Labs  Lab 04/09/2020 0542  WBC 15.8*  HGB 9.4*  HCT 28.9*  PLT 465*    Chemistries  Recent Labs  Lab 04/08/2020 0542  NA 151*  K 2.9*  CL 118*  CO2 24  GLUCOSE 412*  BUN 10  CREATININE 0.37*  CALCIUM 7.5*  MG 2.0  AST 13*  ALT 6  ALKPHOS 61  BILITOT 0.5   Cardiac Enzymes No results for input(s): TROPONINI in  the last 168 hours. RADIOLOGY:  DG Abd 1 View  Result Date: 04/28/2020 CLINICAL DATA:  Small-bowel obstruction EXAM: ABDOMEN - 1 VIEW COMPARISON:  04/21/2020 FINDINGS: Nasogastric tube remains well positioned within the stomach. Oral contrast is seen within large and small bowel. Decreased caliber of small bowel loops. No gross free intraperitoneal air. IVC filter in place. IMPRESSION: Oral contrast within  large and small bowel. Decreased caliber of small bowel loops. Electronically Signed   By: Duanne Guess D.O.   On: 04/11/2020 08:07   DG Abd Portable 1V-Small Bowel Obstruction Protocol-24 hr delay  Result Date: 04/21/2020 CLINICAL DATA:  Small bowel obstruction EXAM: PORTABLE ABDOMEN - 1 VIEW COMPARISON:  Yesterday FINDINGS: Definite improvement in small bowel distension, although still diffusely and abnormally dilated up to 3.4 cm. Some contrast has reached the rectum and been evacuated Enteric tube remains in good position. Stable pre-existing hardware. IMPRESSION: Improved small bowel obstruction with interval passage of contrast into colon. Electronically Signed   By: Marnee Spring M.D.   On: 04/21/2020 10:23   DG Abd Portable 1V-Small Bowel Obstruction Protocol-initial, 8 hr delay  Result Date: 04/20/2020 CLINICAL DATA:  Nausea, vomiting EXAM: PORTABLE ABDOMEN - 1 VIEW COMPARISON:  03/21/2020 FINDINGS: NG tube is in the stomach. Contrast material is seen within the stomach. Dilated small bowel loops are again noted in the abdomen and upper pelvis compatible with small bowel obstruction. No significant change since prior study. No organomegaly. IVC filter in place. IMPRESSION: Continued dilated small bowel loops compatible with small bowel obstruction. No change since prior study. Contrast material seen within the stomach presumably injected through the NG tube. Electronically Signed   By: Charlett Nose M.D.   On: 04/20/2020 19:14   [place] EKG SITE RITE  Result Date: 04/21/2020 If Site Rite image not attached, placement could not be confirmed due to current cardiac rhythm.  ASSESSMENT AND PLAN:   [name] is [name] 54 y.o. 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  She also complains of diffuse abdominal pain.  Acute  metabolic encephalopathy suspected due to severe hypernatremia/hypokalemia/hyperchloremia/hypomagnesimia/hypophosphatemia -came in with serum sodium 136-- 157-- rechecked 159-- dextrose drip-- 151 -pharmacy for electrolyte replacement -received IV D5 water---change to half normal saline since sugars high patient being on TPN -check metabolic panel Q6 hourly to ensure sodium is moving in the right direction -CT head stat given no improvement in mental status -Addendum: CT head no acute finding -neurology consultation with Dr. Otelia Limes. Message sent.  Small bowel obstruction -continue NG tube -overall x-ray seems to be improving per Dr. Aleen Campi. Holding of surgery at present -appreciate surgical input.  -Patient presents to the ER for evaluation of 4-day history of abdominal pain mostly diffuse associated with nausea, vomiting and constipation. -CT abdomen  shows high-grade small bowel obstruction as well as fecal impaction -N.p.o. -Supportive care with pain control, antiemetics, IV PPI and IV fluid hydration  Nutrition Status: severe malnutrition with failure to thrive, poor PO intake Refeeding syndrome with electrolyte abnormality Nutrition Problem: Severe Malnutrition Etiology: chronic illness (COPD, quadriplegia) Signs/Symptoms: percent weight loss, severe fat depletion, severe muscle depletion Percent weight loss: 14 % Interventions: TPN  -patient will benefit from peg tube placement. 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 Patient has multiple comorbidities  DVT prophylaxis: Lovenox Code Status: Full code Family Communication: spoke with son [name] on the phone. Answered all questions to my best ability. Son understands pt is very sick at present. Disposition Plan:  to be determined Consults called: Surgery, palliative care, Neurology  Status is: Inpatient  Remains inpatient appropriate because:Hemodynamically unstable, Persistent severe electrolyte disturbances, IV treatments appropriate due to intensity of illness or inability to take PO and Inpatient level of care appropriate due to severity of illness  Pt remains with acute encephalopathy and severe electrolyte abnormality Neurology consulted   Dispo: The patient is from: Home              Anticipated d/c is to: To be determined              Anticipated d/c date is: > 3 days              Patient currently is not medically stable to d/c.  TOTAL TIME TAKING CARE OF THIS PATIENT: 25 minutes.  >50% time spent on counselling and coordination of care  Note: This dictation was prepared with Dragon dictation along with smaller phrase technology. Any transcriptional errors that result from this process are unintentional.  Enedina Finner M.D    [facility]   CC: Primary care physician; [name], [name], MDPatient ID: [name], female   DOB: 01-25-1966, 54 y.o.   MRN: [id number]

## 2020-04-22 NOTE — TOC Progression Note (Signed)
Transition of Care [facility]) - Progression Note    Patient Details  Name: [name] MRN: [id number] Date of Birth: May 02, 1966  [facility] CM/SW Contact  Eilleen Kempf, LCSW Phone Number: 04/28/2020, 5:27 PM  Clinical Narrative:    CSW was able to POA, Son. Confirmed he was aware of patient's condition and had given consent for treatment. Maurice in patient's room at bedside.        Expected Discharge Plan and Services                                                 Social Determinants of Health (SDOH) Interventions    Readmission Risk Interventions No flowsheet data found.

## 2020-04-23 ENCOUNTER — Inpatient Hospital Stay: Payer: Medicare Other

## 2020-04-23 DIAGNOSIS — E43 Unspecified severe protein-calorie malnutrition: Secondary | ICD-10-CM | POA: Diagnosis not present

## 2020-04-23 DIAGNOSIS — R4182 Altered mental status, unspecified: Secondary | ICD-10-CM

## 2020-04-23 DIAGNOSIS — L89153 Pressure ulcer of sacral region, stage 3: Secondary | ICD-10-CM | POA: Diagnosis not present

## 2020-04-23 DIAGNOSIS — K56609 Unspecified intestinal obstruction, unspecified as to partial versus complete obstruction: Secondary | ICD-10-CM | POA: Diagnosis not present

## 2020-04-23 DIAGNOSIS — E878 Other disorders of electrolyte and fluid balance, not elsewhere classified: Secondary | ICD-10-CM

## 2020-04-23 DIAGNOSIS — G9341 Metabolic encephalopathy: Secondary | ICD-10-CM | POA: Diagnosis not present

## 2020-04-23 LAB — CBC
HCT: 34.2 % — ABNORMAL LOW (ref 36.0–46.0)
Hemoglobin: 11 g/dL — ABNORMAL LOW (ref 12.0–15.0)
MCH: 27.9 pg (ref 26.0–34.0)
MCHC: 32.2 g/dL (ref 30.0–36.0)
MCV: 86.8 fL (ref 80.0–100.0)
Platelets: 402 10*3/uL — ABNORMAL HIGH (ref 150–400)
RBC: 3.94 MIL/uL (ref 3.87–5.11)
RDW: 16.1 % — ABNORMAL HIGH (ref 11.5–15.5)
WBC: 11.6 10*3/uL — ABNORMAL HIGH (ref 4.0–10.5)
nRBC: 0.6 % — ABNORMAL HIGH (ref 0.0–0.2)

## 2020-04-23 LAB — GLUCOSE, CAPILLARY
Glucose-Capillary: 118 mg/dL — ABNORMAL HIGH (ref 70–99)
Glucose-Capillary: 109 mg/dL — ABNORMAL HIGH (ref 70–99)
Glucose-Capillary: 110 mg/dL — ABNORMAL HIGH (ref 70–99)
Glucose-Capillary: 105 mg/dL — ABNORMAL HIGH (ref 70–99)
Glucose-Capillary: 122 mg/dL — ABNORMAL HIGH (ref 70–99)

## 2020-04-23 LAB — BASIC METABOLIC PANEL
Anion gap: 8 (ref 5–15)
BUN: 6 mg/dL (ref 6–20)
CO2: 27 mmol/L (ref 22–32)
Calcium: 7.2 mg/dL — ABNORMAL LOW (ref 8.9–10.3)
Chloride: 104 mmol/L (ref 98–111)
Creatinine, Ser: <0.3 mg/dL — ABNORMAL LOW (ref 0.44–1.00)
Glucose, Bld: 118 mg/dL — ABNORMAL HIGH (ref 70–99)
Potassium: 3.6 mmol/L (ref 3.5–5.1)
Sodium: 139 mmol/L (ref 135–145)

## 2020-04-23 LAB — TRIGLYCERIDES: Triglycerides: 43 mg/dL (ref <150)

## 2020-04-23 LAB — BASIC METABOLIC PANEL WITH GFR
Anion gap: 9 (ref 5–15)
BUN: 8 mg/dL (ref 6–20)
CO2: 26 mmol/L (ref 22–32)
Calcium: 7.1 mg/dL — ABNORMAL LOW (ref 8.9–10.3)
Chloride: 111 mmol/L (ref 98–111)
Creatinine, Ser: <0.3 mg/dL — ABNORMAL LOW (ref 0.44–1.00)
Glucose, Bld: 128 mg/dL — ABNORMAL HIGH (ref 70–99)
Potassium: 2.7 mmol/L — CL (ref 3.5–5.1)
Sodium: 146 mmol/L — ABNORMAL HIGH (ref 135–145)

## 2020-04-23 LAB — MAGNESIUM: Magnesium: 1.6 mg/dL — ABNORMAL LOW (ref 1.7–2.4)

## 2020-04-23 LAB — PHOSPHORUS: Phosphorus: 2 mg/dL — ABNORMAL LOW (ref 2.5–4.6)

## 2020-04-23 MED ORDER — SODIUM CHLORIDE 0.45 % IV SOLN: INTRAVENOUS | Status: DC

## 2020-04-23 MED ORDER — POTASSIUM CHLORIDE 10 MEQ/100ML IV SOLN
10.0000 meq | INTRAVENOUS | Status: AC
  Administered 2020-04-23 (×6): 10 meq via INTRAVENOUS
  Filled 2020-04-23 (×6): qty 100

## 2020-04-23 MED ORDER — MAGNESIUM SULFATE 2 GM/50ML IV SOLN
2.0000 g | Freq: Once | INTRAVENOUS | Status: AC
  Administered 2020-04-23: 2 g via INTRAVENOUS
  Filled 2020-04-23: qty 50

## 2020-04-23 MED ORDER — SODIUM CHLORIDE 0.9 % IV SOLN
INTRAVENOUS | Status: DC | PRN
  Administered 2020-04-23 – 2020-04-26 (×2): 250 mL via INTRAVENOUS
  Administered 2020-04-26: 1000 mL via INTRAVENOUS

## 2020-04-23 MED ORDER — TRAVASOL 10 % IV SOLN
INTRAVENOUS | Status: DC
  Filled 2020-04-23: qty 374.4

## 2020-04-23 MED ORDER — TRAVASOL 10 % IV SOLN
INTRAVENOUS | Status: AC
  Filled 2020-04-23: qty 374.4

## 2020-04-23 NOTE — Progress Notes (Signed)
[facility]  - [place] at [facility]   PATIENT NAME: [name]    MR#:  [id number]  DATE OF BIRTH:  [birth year]  SUBJECTIVE:  patient more awake. Responding with nodding head yes and no to questions. Did say hello and morning to me. Significant oral secretions frequently section by RN. Started on IV antibiotics broad-spectrum for suspected bacterial meningitis by neurology patient tolerating antibiotics well  REVIEW OF SYSTEMS:   Review of Systems  Unable to perform ROS: Mental status change   awake however not able to communicate much. Nods head yes and no Tolerating Diet:npo Tolerating PT: bedbound due to functional quadriplegia  DRUG ALLERGIES:   Allergies  Allergen Reactions  . Shellfish Allergy Shortness Of Breath    VITALS:  Blood pressure (!) 150/98, pulse 62, temperature 98.3 F (36.8 C), resp. rate 15, height 5\' 1"  (1.549 m), weight 33.9 kg, SpO2 100 %.  PHYSICAL EXAMINATION:   Physical Exam limited exam secondary to mental status change  GENERAL:  54 y.o.-year-old patient lying in the bed with no acute distress. Thin cachectic severely malnourished EYES: sunken eyeballs, pupils dilated--sluggish rxn to light \\LUNGS : Normal breath sounds bilaterally, no wheezing, rales, rhonchi. No use of accessory muscles of respiration. No respiratory distress CARDIOVASCULAR: S1, S2 normal. No murmurs, rubs, or gallops.  ABDOMEN: Soft, nontender, nondistended. urostomy bag +  EXTREMITIES: thin atrophied with contractors PICC + NEUROLOGIC: lethargic. Unable to assess  PSYCHIATRIC:  patient is lethargic SKIN:   Pressure Injury 04/18/2020 Sacrum Stage 3 -  Full thickness tissue loss. Subcutaneous fat may be visible but bone, tendon or muscle are NOT exposed. (Active)  04/06/2020 1433  Location: Sacrum  Location Orientation:   Staging: Stage 3 -  Full thickness tissue loss. Subcutaneous fat may be visible but bone, tendon or muscle are NOT exposed.  Wound  Description (Comments):   Present on Admission: Yes     Pressure Injury 04/02/2020 Back Lateral;Left;Upper Stage 2 -  Partial thickness loss of dermis presenting as a shallow open injury with a red, pink wound bed without slough. 1.75cm x 2cm (Active)  04/19/2020 1923  Location: Back  Location Orientation: Lateral;Left;Upper  Staging: Stage 2 -  Partial thickness loss of dermis presenting as a shallow open injury with a red, pink wound bed without slough.  Wound Description (Comments): 1.75cm x 2cm  Present on Admission: Yes     Pressure Injury 04/03/2020 Buttocks Right Stage 3 -  Full thickness tissue loss. Subcutaneous fat may be visible but bone, tendon or muscle are NOT exposed. 5.5cm x 4.5cm x 0.75cm (Active)  04/20/2020 1924  Location: Buttocks  Location Orientation: Right  Staging: Stage 3 -  Full thickness tissue loss. Subcutaneous fat may be visible but bone, tendon or muscle are NOT exposed.  Wound Description (Comments): 5.5cm x 4.5cm x 0.75cm  Present on Admission: Yes     Pressure Injury 04/11/2020 Heel Left;Right  (Active)  05/02/2020 2000  Location: Heel  Location Orientation: Left;Right  Staging: -- (recently healed pressure injury)  Wound Description (Comments):   Present on Admission: Yes   LABORATORY PANEL:  CBC Recent Labs  Lab 04/15/2020 0542  WBC 15.8*  HGB 9.4*  HCT 28.9*  PLT 465*    Chemistries  Recent Labs  Lab 04/08/2020 0542 04/25/2020 1036 04/23/20 0100  NA 151*   < > 146*  K 2.9*   < > 2.7*  CL 118*   < > 111  CO2 24   < > 26  GLUCOSE 412*   < > 128*  BUN 10   < > 8  CREATININE 0.37*   < > <0.30*  CALCIUM 7.5*   < > 7.1*  MG 2.0  --  1.6*  AST 13*  --   --   ALT 6  --   --   ALKPHOS 61  --   --   BILITOT 0.5  --   --    < > = values in this interval not displayed.   Cardiac Enzymes No results for input(s): TROPONINI in the last 168 hours. RADIOLOGY:  DG Abd 1 View  Result Date: 2020-05-13 CLINICAL DATA:  Small-bowel obstruction EXAM:  ABDOMEN - 1 VIEW COMPARISON:  04/21/2020 FINDINGS: Nasogastric tube remains well positioned within the stomach. Oral contrast is seen within large and small bowel. Decreased caliber of small bowel loops. No gross free intraperitoneal air. IVC filter in place. IMPRESSION: Oral contrast within large and small bowel. Decreased caliber of small bowel loops. Electronically Signed   By: Duanne Guess D.O.   On: 13-May-2020 08:07   CT HEAD WO CONTRAST  Result Date: 13-May-2020 CLINICAL DATA:  Mental status change with unknown cause EXAM: CT HEAD WITHOUT CONTRAST TECHNIQUE: Contiguous axial images were obtained from the base of the skull through the vertex without intravenous contrast. COMPARISON:  01/20/2008 FINDINGS: Brain: No evidence of acute infarction, hemorrhage, hydrocephalus, extra-axial collection or mass lesion/mass effect. Small remote biparietal cortically based insult. Vascular: No hyperdense vessel or unexpected calcification. Skull: Normal. Negative for fracture or focal lesion. Retro dental ligamentous thickening with some degree of canal narrowing, assessment limited by coverage and stable when compared to prior. Sinuses/Orbits: No acute finding. IMPRESSION: No acute finding or change from 2009. Electronically Signed   By: Marnee Spring M.D.   On: 05-13-2020 10:11   DG Abd Portable 1V  Result Date: 04/23/2020 CLINICAL DATA:  Small-bowel obstruction EXAM: PORTABLE ABDOMEN - 1 VIEW COMPARISON:  None. FINDINGS: Nasogastric tube is seen within the left upper quadrant of the abdomen overlying the gastric fundus. Contrast previously noted within the distal small bowel has now passed into the colon and rectum which is decompressed. Multiple gas-filled dilated loops of small bowel are still identified within the mid abdomen and, together, the findings are compatible with a partial small bowel obstruction. No other changes seen. Inferior vena cava filter, intrathecal pain pump, and intrauterine device  noted. IMPRESSION: Partial small bowel obstruction with passage of contrast into a decompressed colon and rectum. Electronically Signed   By: Helyn Numbers MD   On: 04/23/2020 08:28   DG FL GUIDED LUMBAR PUNCTURE  Result Date: 05-13-20 CLINICAL DATA:  Acute encephalopathy. EXAM: DIAGNOSTIC LUMBAR PUNCTURE UNDER FLUOROSCOPIC GUIDANCE FLUOROSCOPY TIME:  Fluoroscopy Time:  1 minutes 18 seconds Radiation Exposure Index (if provided by the fluoroscopic device): 14.2 mGy Number of Acquired Spot Images: 2 PROCEDURE: After discussing the risks and benefits of this procedure with the patient's son informed consent was obtained. The patient was evaluated under fluoroscopy. The large ulceration and adjacent inflammatory tissue on the patient's lower back and sacrum is over the site that we would use for lumbar puncture making access not possible. In addition the patient's pain pump in the anterior abdomen is overlying the spine making evaluation of an access site difficult. Lumbar puncture not performed. IMPRESSION: Lumbar puncture not performed as discussed above. Electronically Signed   By: Maisie Fus  Register   On: May 13, 2020 16:44   ASSESSMENT AND PLAN:   Dai Apel  Thurmond ButtsWade is a 54 y.o. 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  She also complains of diffuse abdominal pain.  Acute metabolic encephalopathy suspected due to severe hypernatremia/hypokalemia/hyperchloremia/hypomagnesimia/hypophosphatemia -came in with serum sodium 136-- 157-- rechecked 159-- dextrose drip-- 151--148 -pharmacy for electrolyte replacement -patient is more awake and alert. She is responding by nodding head. Syncopal words. -CT head no acute finding -neurology consultation with Dr. Otelia LimesLindzen-- recommends empiric antibiotic for bacterial meningitis  Small bowel  obstruction -continue NG tube -overall x-ray seems to be improving per Dr. Aleen CampiPiscoya. Holding of surgery at present -appreciate surgical input.  --CT abdomen  shows high-grade small bowel obstruction as well as fecal impaction -N.p.o. -Supportive care with pain control, antiemetics, IV PPI and IV fluid hydration  Nutrition Status: severe malnutrition with failure to thrive, poor PO intake Refeeding syndrome with electrolyte abnormality Nutrition Problem: Severe Malnutrition Etiology: chronic illness (COPD, quadriplegia) Signs/Symptoms: percent weight loss, severe fat depletion, severe muscle depletion Percent weight loss: 14 % Interventions: TPN  -patient will benefit from peg tube placement. 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 Patient has multiple comorbidities  DVT prophylaxis: Lovenox Code Status: Full code Family Communication: spoke with son Erlinda HongMaurice Rita on the phone today 10/22. Answered all questions  to my best ability.  Disposition Plan:  to be determined Consults called: Surgery, palliative care, Neurology  Status is: Inpatient  Remains inpatient appropriate because:Hemodynamically unstable, Persistent severe electrolyte disturbances, IV treatments appropriate due to intensity of illness or inability to take PO and Inpatient level of care appropriate due to severity of illness  Pt remains with acute encephalopathy and severe electrolyte abnormality. Patient will likely need NG feeding and thereafter transition to peg feeding once hemodynamically stable   Dispo: The patient is from: Home              Anticipated d/c is to: To be determined              Anticipated d/c date is: > 3 days              Patient currently is not medically stable to d/c.  TOTAL TIME TAKING CARE OF THIS PATIENT: 25 minutes.  >50% time spent on counselling and coordination of care  Note: This dictation was prepared with Dragon dictation along with smaller phrase technology. Any transcriptional errors that result from this process are unintentional.  Enedina FinnerSona Jordain [name] M.D    [facility]   CC: Primary care physician; [place], Julie A, MDPatient ID: [name], female  DOB: 16-Jan-1966, 54 y.o.   MRN: [id number]

## 2020-04-23 NOTE — Progress Notes (Signed)
eeg completed ° °

## 2020-04-23 NOTE — Consult Note (Signed)
PHARMACY - TOTAL PARENTERAL NUTRITION CONSULT NOTE   Indication: bowel obstruction  Patient Measurements: Height: 5\' 1"  (154.9 cm) Weight: 33.9 kg (74 lb 11.8 oz) IBW/kg (Calculated) : 47.8   Body mass index is 14.12 kg/m.  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  Glucose / Insulin: BG 109 - 393, 6 units SSI required Electrolytes: hypokalemia Renal:  SCr<1, stable LFTs / TGs: LFTs wnl at baseline Prealbumin / albumin: 5.9 mg/dL / 3.5 g/dL Intake / Output; MIVF:  10/21 0701 - 10/22 0700 In: 2591 [I.V.:1679.6; IV Piggyback:911.4] Out: 2150 [Urine:2100; Emesis/NG output:50]   0.45% NaCl at 80 mL/hr  GI Imaging: 10/21: decreased caliber of small bowel loops 10/22: partial small bowel obstruction with passage of contrast into a decompressed colon and rectum  Surgeries / Procedures: none since TPN began  Central access: 04/21/20  TPN start date: 04/21/20   Nutritional Goals (per RD recommendation on 04/21/20): kCal: 1200 - 1400 kCal/day, Protein: 60 - 70 g/day, Fluid: 1000 - 1200 mL/day  Current Nutrition:  NPO Per surgery: If she passes clamping trial, initiate trickle tube feeding through tube  Plan:   advance TPN to 30 mL/hr at 1800 (total volume 820 mL)  This is 1/2 of goal  Nutritional Components:  Amino Acids (10%): 52 g/L (37.4 g)  Dextrose: 15.3 % (110.2 g)  Lipids: 31.3 g/L (22.6 g)  Noted shellfish allergy but tolerates SMOF lipids based on response to previous administration  Calories: 499.7 kCal   Electrolytes in TPN: 75mEq/L of Na, 68mEq/L of K, 65mEq/L of Ca, 61mEq/L of Mg, and 2mmol/L of Phos. Cl:Ac ratio 1:1  Add standard MVI, 100 mg thiamine and trace elements to TPN  Continue very sensitive q6h SSI and adjust as needed   decrease 0.45% NaCl MIVF to 70 mL/hr once new rate has begun  Monitor  TPN labs on Mon/Thurs, daily until stable  12m 04/23/2020,7:17 AM

## 2020-04-23 NOTE — Progress Notes (Signed)
Repeat BMP re-timed for around 830AM after last bag of K correction has infused. Will handover to day RN as pt is a unit collect.

## 2020-04-23 NOTE — Progress Notes (Signed)
Pharmacy Antibiotic Note  [name] is a 54 y.o. female with PMH for COPD, quadriplegia s/p spinal cord injury and cystectomy with ileal conduit diversion who was admitted on 05-16-2020 with complaints of N/V x4 days; patient was admitted for SBO. Neurology consulted. Patient was to get LP 10/21 however it was cancelled due to pressure sore extended to mid back and patient having pain pump. Pharmacy was consulted for Vancomycin, ceftriaxone, and ampicillin dosing for meningitis.  Plan:  1) continue vancomycin 750mg  IV every 24 hours   Vancomycin levels as clinically indicated  Daily SCr to assess renal function while on vancomycin  2) continue ampicillin 2 grams IV every 6 hours  3) continue ceftriaxone 2g IV q12hrs  Height: 5\' 1"  (154.9 cm) Weight: 33.9 kg (74 lb 11.8 oz) IBW/kg (Calculated) : 47.8  Temp (24hrs), Avg:97.8 F (36.6 C), Min:97 F (36.1 C), Max:98.3 F (36.8 C)  Recent Labs  Lab 2020-05-16 1109 16-May-2020 1109 05/16/20 1408 04/19/20 0443 04/19/20 0443 04/21/20 0415 04/21/20 0415 04/21/20 1138 04/21/20 1929 04/16/2020 0542 04/07/2020 1036 04/11/2020 1853 04/23/20 0100  WBC 5.6  --   --  17.9*  --  19.3*  --   --   --  15.8*  --   --   --   CREATININE 0.44   < >  --  <0.30*   < > 0.44   < >  --  0.55 0.37* 0.33* <0.30* <0.30*  LATICACIDVEN  --   --  0.6  --   --   --   --  0.9  --   --   --   --   --    < > = values in this interval not displayed.    CrCl cannot be calculated (This lab value cannot be used to calculate CrCl because it is not a number: <0.30).    Allergies  Allergen Reactions  . Shellfish Allergy Shortness Of Breath    Antimicrobials this admission: 10/17 Ceftriaxone >>10/20 10/20 Zosyn >> 10/21 10/21 Ceftriaxone >>  10/21 Vancomycin >> 10/21 Ampicillin >>  Microbiology results: 10/17 UCx: multiple species  10/17 SARS CoV-2: negative 10/17 influenza A/B: negative  Thank you for allowing pharmacy to be a part of this patient's  care.  11/17, PharmD  04/23/2020 10:32 AM

## 2020-04-23 NOTE — Progress Notes (Signed)
[facility] SURGICAL PROGRESS NOTE (cpt [phone])  Hospital Day(s): 5.   Post op day(s): 1 Day Post-Op.   Interval History: Patient seen and examined, no acute events or new complaints overnight. Patient more lucid this morning, able to shake head yes/no for some questions. CBC pending. Her hypernatremia nearly corrected, down to 146. Still with significant hypokalemia to 2.7 despite repletion. Phosphorus and magnesemia also low at 2.0 and 1.6 respectively. sCr remains <0.30. UO in last 24 hours 2.1L. NGT output remains low at 50 ccs recorded in last 24 hours and she reportedly continues to have bowel function. Neurology consulted for change in mental status and recommend LP for possible meningitis. This could not be accomplished secondary to decubitus sores and baclofen pump.  Ampicillin and Rocephin were added to her Zosyn for empiric coverage.   Review of Systems:  Unable to reliably preform secondary to AMS, improved  Vital signs in last 24 hours: [min-max] current  Temp:  [97 F (36.1 C)-99.1 F (37.3 C)] 97.7 F (36.5 C) (10/22 0401) Pulse Rate:  [55-64] 64 (10/22 0401) Resp:  [14-18] 15 (10/22 0401) BP: (112-168)/(57-83) 150/83 (10/22 0401) SpO2:  [98 %-100 %] 100 % (10/22 0401)     Height: 5\' 1"  (154.9 cm) Weight: 33.9 kg     Intake/Output last 2 shifts:  10/21 0701 - 10/22 0700 In: 2591 [I.V.:1679.6; IV Piggyback:911.4] Out: 2150 [Urine:2100; Emesis/NG output:50]   Physical Exam:  Constitutional:somnolent, briefly opens eyes to verbal stimuli HENT: normocephalic without obvious abnormality, NGT in place Eyes: PERRL, EOM's grossly intact and symmetric  Respiratory: breathing non-labored at rest  Cardiovascular: regular rate and sinus rhythm  Gastrointestinal:Soft,does not appear tender, non-distended, no rebound/guarding, urostomy in place in LLQ.Patient has on the right lower quadrant subcutaneous baclofen pump Musculoskeletal:Chronic atrophy secondary to  history of paraplegia   Labs:  CBC Latest Ref Rng & Units 04/13/2020 04/21/2020 04/19/2020  WBC 4.0 - 10.5 K/uL 15.8(H) 19.3(H) 17.9(H)  Hemoglobin 12.0 - 15.0 g/dL 04/21/2020) 6.3(A) 4.5(X)  Hematocrit 36 - 46 % 28.9(L) 30.5(L) 31.1(L)  Platelets 150 - 400 K/uL 465(H) 469(H) 407(H)   CMP Latest Ref Rng & Units 04/23/2020 04/20/2020 04/09/2020  Glucose 70 - 99 mg/dL 04/24/2020) 803(O) 122(Q)  BUN 6 - 20 mg/dL 8 10 10   Creatinine 0.44 - 1.00 mg/dL 825(O) ) <0.37(C)  Sodium 135 - 145 mmol/L 146(H) 145 148(H)  Potassium 3.5 - 5.1 mmol/L 2.7(LL) 3.5 2.9(L)  Chloride 98 - 111 mmol/L 111 111 113(H)  CO2 22 - 32 mmol/L 26 22 24   Calcium 8.9 - 10.3 mg/dL 7.1(L) 7.3(L) 7.7(L)  Total Protein 6.5 - 8.1 g/dL - - -  Total Bilirubin 0.3 - 1.2 mg/dL - - -  Alkaline Phos 38 - 126 U/L - - -  AST 15 - 41 U/L - - -  ALT 0 - 44 U/L - - -    Imaging studies:  KUB (04/23/2020) personally reviewed which appears markedly improved with contrast throughout colon, and radiologist report reviewed below:  IMPRESSION: Partial small bowel obstruction with passage of contrast into a decompressed colon and rectum.   Assessment/Plan: (ICD-10's: K50.609) 54 y.o. female with AMS (improving) and numerous electrolyte derangements, most likely secondary to refeeding, but clinical and radiologic improvement small bowel obstruction, most likely secondary to post-surgical adhesions, complicated by history of paraplegia, malnutrition, deconditioning.    - We will preform NGT clamping trial given resolution in SBO. Check residuals at 4 hours. If residuals are less than 150 ccs we  can leave NGT clamp. If she passes clamping trial, it would be okay to initiate trickle tube feeding through tube. As she continues to progress, we can get GI involve for evaluation of PEG tube placement.    - Continue TPN; hold off on advancing further, she appears to be refeeding, monitor and replete electrolyte derangements prior to advancing    - Continue NPO + IVF resuscitation - Monitor abdominal examination; on-going bowel function - Pain control prn (minimize narcotics if feasible); antiemetics prn - Serial KUBs as needed - Monitor leukocytosis; improved; unsure source             - Further management per primary service; we will follow   All of the above findings and recommendations were discussed with the medical team.  -- Lynden Oxford, PA-C [facility] 04/23/2020, 7:09 AM [phone] M-F: 7am - 4pm

## 2020-04-23 NOTE — Progress Notes (Signed)
NG residual 18ml since clamping this morning. Continue to keep NGT clamped per Dr. Aleen Campi. Pt showing no signs or symptoms of N/V.   Madie Reno, RN

## 2020-04-23 NOTE — Plan of Care (Signed)
Continue with plan of care.   Janiel Crisostomo D Marcio Hoque, RN  

## 2020-04-23 NOTE — Procedures (Signed)
Patient Name: [name]  MRN: [id number]  Epilepsy Attending: Charlsie Quest  Referring Physician/Provider: Dr. Caryl Pina Date: 04/23/2020 Duration: 22.16 minutes  Patient history: 53 year old female with chronic quadriplegia secondary to cervical spine injury presented with altered mental status.  EEG to evaluate for seizures.  Level of alertness: Lethargic  AEDs during EEG study: None  Technical aspects: This EEG study was done with scalp electrodes positioned according to the 10-20 International system of electrode placement. Electrical activity was acquired at a sampling rate of 500Hz  and reviewed with a high frequency filter of 70Hz  and a low frequency filter of 1Hz . EEG data were recorded continuously and digitally stored.   Description: EEG showed continuous generalized 3 to 5 cm in delta slowing.  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  At times these discharges appeared rhythmic with some evolution and frequency to 1.5 to 2 Hz without any evolution or morphology.  Physiologic photic driving was not seen during photic stimulation.  Hyperventilation was not performed.  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  Additionally, there is evidence of moderate to severe diffuse encephalopathy, nonspecific to etiology.  No definite seizures were seen during the study.  Dandy Lazaro 

## 2020-04-24 ENCOUNTER — Inpatient Hospital Stay: Payer: Medicare Other

## 2020-04-24 DIAGNOSIS — G9340 Encephalopathy, unspecified: Secondary | ICD-10-CM | POA: Diagnosis not present

## 2020-04-24 DIAGNOSIS — K56609 Unspecified intestinal obstruction, unspecified as to partial versus complete obstruction: Secondary | ICD-10-CM | POA: Diagnosis not present

## 2020-04-24 LAB — GLUCOSE, CAPILLARY
Glucose-Capillary: 109 mg/dL — ABNORMAL HIGH (ref 70–99)
Glucose-Capillary: 94 mg/dL (ref 70–99)
Glucose-Capillary: 110 mg/dL — ABNORMAL HIGH (ref 70–99)
Glucose-Capillary: 134 mg/dL — ABNORMAL HIGH (ref 70–99)

## 2020-04-24 LAB — BASIC METABOLIC PANEL
Anion gap: 6 (ref 5–15)
BUN: 7 mg/dL (ref 6–20)
CO2: 32 mmol/L (ref 22–32)
Calcium: 7.5 mg/dL — ABNORMAL LOW (ref 8.9–10.3)
Chloride: 107 mmol/L (ref 98–111)
Creatinine, Ser: <0.3 mg/dL — ABNORMAL LOW (ref 0.44–1.00)
Glucose, Bld: 112 mg/dL — ABNORMAL HIGH (ref 70–99)
Potassium: 3.4 mmol/L — ABNORMAL LOW (ref 3.5–5.1)
Sodium: 145 mmol/L (ref 135–145)

## 2020-04-24 LAB — PHOSPHORUS: Phosphorus: 2.1 mg/dL — ABNORMAL LOW (ref 2.5–4.6)

## 2020-04-24 LAB — MAGNESIUM: Magnesium: 1.9 mg/dL (ref 1.7–2.4)

## 2020-04-24 MED ORDER — PROSOURCE TF PO LIQD: 45.0000 mL | Freq: Two times a day (BID) | ORAL | Status: DC

## 2020-04-24 MED ORDER — OSMOLITE 1.5 CAL PO LIQD
1000.0000 mL | ORAL | Status: DC
  Administered 2020-04-24 – 2020-04-27 (×5): 1000 mL

## 2020-04-24 MED ORDER — POTASSIUM PHOSPHATES 15 MMOLE/5ML IV SOLN
10.0000 mmol | Freq: Once | INTRAVENOUS | Status: AC
  Administered 2020-04-24: 10 mmol via INTRAVENOUS
  Filled 2020-04-24: qty 3.33

## 2020-04-24 MED ORDER — POTASSIUM CHLORIDE 10 MEQ/100ML IV SOLN
10.0000 meq | INTRAVENOUS | Status: AC
  Administered 2020-04-24 (×2): 10 meq via INTRAVENOUS
  Filled 2020-04-24 (×2): qty 100

## 2020-04-24 MED ORDER — TRAVASOL 10 % IV SOLN
INTRAVENOUS | Status: AC
  Filled 2020-04-24: qty 374.4

## 2020-04-24 MED ORDER — VANCOMYCIN HCL 750 MG/150ML IV SOLN
750.0000 mg | INTRAVENOUS | Status: DC
  Filled 2020-04-24: qty 150

## 2020-04-24 NOTE — Progress Notes (Signed)
[name] SURGICAL PROGRESS NOTE (cpt [id number])  Hospital Day(s): 6.   Post op day(s): n/a.   Interval History: Today, she is significantly more alert and is able to answer questions appropriately.  Her electrolytes are still somewhat abnormal but are improving.  She has tolerated having her NG tube clamped without nausea, vomiting, or abdominal distention   Vital signs in last 24 hours: [min-max] current  Temp:  [97.2 F (36.2 C)-98.3 F (36.8 C)] 97.8 F (36.6 C) (10/23 0717) Pulse Rate:  [64-91] 91 (10/23 0717) Resp:  [15-24] 18 (10/23 0717) BP: (116-147)/(75-105) 124/81 (10/23 0717) SpO2:  [99 %-100 %] 100 % (10/23 0717) Weight:  [33.6 kg] 33.6 kg (10/23 0500)     Height: 5\' 1"  (154.9 cm) Weight: 33.6 kg BMI (Calculated): 14   Intake/Output last 2 shifts:  10/22 0701 - 10/23 0700 In: 1465.4 [I.V.:677.1; IV Piggyback:788.3] Out: 2600 [Urine:2600]   Physical Exam:  Constitutional:Opens eyes to voice, responds appropriately to questioning HENT: normocephalic without obvious abnormality, NGT in place Eyes: PERRL, EOM's grossly intact and symmetric  Respiratory: breathing non-labored at rest  Cardiovascular: regular rate and sinus rhythm  Gastrointestinal:Soft,nontender, non-distended, no rebound/guarding, urostomy in place in LLQ.Patient has on the right lower quadrant subcutaneous baclofen pump Musculoskeletal:Chronic atrophy secondary to history of paraplegia   Labs:  CBC Latest Ref Rng & Units 04/23/2020 04/08/2020 04/21/2020  WBC 4.0 - 10.5 K/uL 11.6(H) 15.8(H) 19.3(H)  Hemoglobin 12.0 - 15.0 g/dL 11.0(L) 9.4(L) 9.4(L)  Hematocrit 36 - 46 % 34.2(L) 28.9(L) 30.5(L)  Platelets 150 - 400 K/uL 402(H) 465(H) 469(H)   CMP Latest Ref Rng & Units 04/24/2020 04/23/2020 04/23/2020  Glucose 70 - 99 mg/dL 04/25/2020) 720(N) 470(J)  BUN 6 - 20 mg/dL 7 6 8   Creatinine 0.44 - 1.00 mg/dL 628(Z) ) <6.62(H)  Sodium 135 - 145 mmol/L 145 139 146(H)  Potassium  3.5 - 5.1 mmol/L 3.4(L) 3.6 2.7(LL)  Chloride 98 - 111 mmol/L 107 104 111  CO2 22 - 32 mmol/L 32 27 26  Calcium 8.9 - 10.3 mg/dL 7.5(L) 7.2(L) 7.1(L)  Total Protein 6.5 - 8.1 g/dL - - -  Total Bilirubin 0.3 - 1.2 mg/dL - - -  Alkaline Phos 38 - 126 U/L - - -  AST 15 - 41 U/L - - -  ALT 0 - 44 U/L - - -    Imaging studies:  KUB (04/24/2020) personally reviewed which appears markedly improved with contrast throughout colon, and radiologist report reviewed below:  FINDINGS: A few loops of distended small bowel are seen in the central abdomen. Enteric contrast is seen throughout the nondilated colon. Enteric tube remains in good position. Stable aeration at the bases. No concerning mass effect or gas collection. Stable postoperative changes which are multifocal  IMPRESSION: Stable findings of partial small bowel obstruction  Assessment/Plan: (ICD-10's: K50.609) 54 y.o. female with recent altered mental status now improving significantly and numerous electrolyte derangements, most likely secondary to refeeding, but clinical and radiologic improvement small bowel obstruction, most likely secondary to post-surgical adhesions, complicated by history of paraplegia, malnutrition, deconditioning.    -She tolerated NG clamping trial.  NG tube remains in place and I think it would be appropriate to initiate trophic tube feeds at this time.  A PEG tube may be considered, although during my conversation with her today, the patient expressed a desire to not have one placed   - Continue TPN; hold off on advancing further, she appears to be refeeding, monitor and replete electrolyte  derangements prior to advancing   - Continue NPO + IVF resuscitation - Monitor abdominal examination; on-going bowel function - Pain control prn (minimize narcotics if feasible); antiemetics prn - Serial KUBs as needed - Monitor leukocytosis; improved; unsure source              - Further management per primary service; we will follow   All of the above findings and recommendations were discussed with the medical team.

## 2020-04-24 NOTE — Evaluation (Signed)
Clinical/Bedside Swallow Evaluation Patient Details  Name: [name] MRN: [id number] Date of Birth: Jun 14, 1966  Today's Date: 04/24/2020 Time: SLP Start Time (ACUTE ONLY): 1130 SLP Stop Time (ACUTE ONLY): 1215 SLP Time Calculation (min) (ACUTE ONLY): 45 min  Past Medical History:  Past Medical History:  Diagnosis Date  . COPD (chronic obstructive pulmonary disease) (HCC)   . History of urostomy   . Paraplegic spinal paralysis [facility])    Past Surgical History: History reviewed. No pertinent surgical history. HPI:      Assessment / Plan / Recommendation Clinical Impression  Pt seen for BSE w/ trials of liquids a few, small tsp amounts of puree(applesauce) d/t current GI issues restricting po trials. Pt currently has a NGT in place for suction previously, now TFs and TPN ongoing. Pt is much improved in mental and GI status' per MD since admit.  Pt was verbally engaged w/ SLP and Sister present. Followed general instructions appropriately. Vocal quality and cough strong. Less reports of phlegm today. Pt has not been taking oral intake for several days and often prefer to suction/spit saliva -- discussed the importance of oral care using swabs for hygiene and stimulation of swallowing to clear own saliva vs suction. Requires total assist w/ feeding d/t Baseline quadriplegia.  She appears to present w/ adequate oropharyngeal phase swallow w/ No oropharyngeal phase dysphagia noted, No neuromuscular deficits noted.Pt consumed po trials w/ No overt, clinical s/s of aspiration during po trials. Pt appears at reduced risk for aspiration following general aspiration precautions. During po trials, pt consumed thin liquids and puree consistencies w/ no overt coughing, decline in vocal quality, or change in respiratory presentation during/post trials. Oral phase appeared [facility] w/ timely bolus management and control of bolus propulsion for A-P transfer for swallowing. Oral clearing achieved w/ all trial  consistencies. OM Exam appeared [facility] w/ no unilateral weakness noted. Speech Clear.  Recommend a Clear liquid consistency diet at this time as per GI issues/restrictions per MD; liquidsvia straw w/ monitoring of small, single sips. Recommend general aspiration precautions. Education given on Pills in Puree later on s/p NGT removal; food consistencies and easy to eat options; gravies and condiments to moisten well; general aspiration and Reflux precautions. MD/NSG agreed. ST services will continue to f/u w/ status and give trials to upgrade diet when GI status appropriate per MD.  SLP Visit Diagnosis: Dysphagia, unspecified (R13.10) (GI issues underlying)    Aspiration Risk  Mild aspiration risk;Risk for inadequate nutrition/hydration (reduced following general precautions)    Diet Recommendation  Clear liquid diet (thin consistency); general aspiration and Reflux precautions; Feeding support  Medication Administration: Via alternative means (crushed in puree when appropriate time)    Other  Recommendations Recommended Consults:  (Dietician f/u) Oral Care Recommendations: Oral care QID;Staff/trained caregiver to provide oral care;Oral care before and after PO Other Recommendations:  (n/a)   Follow up Recommendations None (TBD)      Frequency and Duration min 2x/week  2 weeks       Prognosis Prognosis for Safe Diet Advancement: Fair (-Good) Barriers to Reach Goals: Severity of deficits;Time post onset (GI issues currently)      Swallow Study   General Date of Onset: 04/07/2020 Type of Study: Bedside Swallow Evaluation Previous Swallow Assessment: none reported  Diet Prior to this Study: NG Tube (TPN) Temperature Spikes Noted: No (wbc 11.6) Respiratory Status: Room air History of Recent Intubation: No Behavior/Cognition: Alert;Cooperative;Pleasant mood Oral Cavity Assessment: Within Functional Limits (had been eating ice chips) Oral  Care Completed by SLP: Yes Oral Cavity -  Dentition: Adequate natural dentition Vision:  (adequate) Self-Feeding Abilities: Total assist (quadriplegia) Patient Positioning: Upright in bed (needed min support fo rmidline) Baseline Vocal Quality: Normal Volitional Cough: Strong Volitional Swallow: Able to elicit    Oral/Motor/Sensory Function Overall Oral Motor/Sensory Function: Within functional limits   Ice Chips Ice chips: Within functional limits Presentation: Spoon (fed; 2 trials)   Thin Liquid Thin Liquid: Within functional limits Presentation: Straw (fed; ~4 ozs total)    Nectar Thick Nectar Thick Liquid: Not tested   Honey Thick Honey Thick Liquid: Not tested   Puree Puree: Within functional limits Presentation: Spoon (fed; 3 trials) Other Comments: 1/2 tps amounts appeared wfl for bolus management and swallow/clearing   Solid     Solid: Not tested        [name] Som, MS, CCC-SLP Speech Language Pathologist [facility] Services [phone] [name] 04/24/2020,12:24 PM

## 2020-04-24 NOTE — Progress Notes (Signed)
[facility]  -  at [facility]   PATIENT NAME: [name]    MR#:  [id number]  DATE OF BIRTH:  1966-03-09  SUBJECTIVE:  patient more awake.  Sister at bedside. Patient is communicative today. She wants ice chips or water to drink. Able to swallow secretions. No fever.  When discussed about peg tube refuses it.  REVIEW OF SYSTEMS:   Review of Systems  Constitutional: Negative for chills, fever and weight loss.  HENT: Negative for ear discharge, ear pain and nosebleeds.   Eyes: Negative for blurred vision, pain and discharge.  Respiratory: Negative for sputum production, shortness of breath, wheezing and stridor.   Cardiovascular: Negative for chest pain, palpitations, orthopnea and PND.  Gastrointestinal: Negative for abdominal pain, diarrhea, nausea and vomiting.  Genitourinary: Negative for frequency and urgency.  Musculoskeletal: Negative for back pain and joint pain.  Neurological: Positive for weakness. Negative for sensory change, speech change and focal weakness.  Psychiatric/Behavioral: Negative for depression and hallucinations. The patient is not nervous/anxious.    Tolerating Diet:cld Tolerating PT: bedbound due to functional quadriplegia  DRUG ALLERGIES:   Allergies  Allergen Reactions  . Shellfish Allergy Shortness Of Breath    VITALS:  Blood pressure 135/81, pulse 94, temperature 98.6 F (37 C), temperature source Oral, resp. rate 20, height 5\' 1"  (1.549 m), weight 33.6 kg, SpO2 100 %.  PHYSICAL EXAMINATION:   Physical Exam   GENERAL:  54 y.o.-year-old patient lying in the bed with no acute distress. Thin cachectic severely malnourished EYES: sunken eyeballs, pupils dilated--sluggish rxn to light \\LUNGS : Normal breath sounds bilaterally, no wheezing, rales, rhonchi. No use of accessory muscles of respiration. No respiratory distress CARDIOVASCULAR: S1, S2 normal. No murmurs, rubs, or gallops.  ABDOMEN: Soft, nontender,  nondistended. urostomy bag +  EXTREMITIES: thin atrophied with contractors PICC + NEUROLOGIC: functional quadriplegia with significant contracture both upper and lower extremity PSYCHIATRIC:  patient is alert oriented times three SKIN:   Pressure Injury 04/11/2020 Sacrum Stage 3 -  Full thickness tissue loss. Subcutaneous fat may be visible but bone, tendon or muscle are NOT exposed. (Active)  04/13/2020 1433  Location: Sacrum  Location Orientation:   Staging: Stage 3 -  Full thickness tissue loss. Subcutaneous fat may be visible but bone, tendon or muscle are NOT exposed.  Wound Description (Comments):   Present on Admission: Yes     Pressure Injury 04/20/2020 Back Lateral;Left;Upper Stage 2 -  Partial thickness loss of dermis presenting as a shallow open injury with a red, pink wound bed without slough. 1.75cm x 2cm (Active)  04/05/2020 1923  Location: Back  Location Orientation: Lateral;Left;Upper  Staging: Stage 2 -  Partial thickness loss of dermis presenting as a shallow open injury with a red, pink wound bed without slough.  Wound Description (Comments): 1.75cm x 2cm  Present on Admission: Yes     Pressure Injury 04/13/2020 Buttocks Right Stage 3 -  Full thickness tissue loss. Subcutaneous fat may be visible but bone, tendon or muscle are NOT exposed. 5.5cm x 4.5cm x 0.75cm (Active)  04/09/2020 1924  Location: Buttocks  Location Orientation: Right  Staging: Stage 3 -  Full thickness tissue loss. Subcutaneous fat may be visible but bone, tendon or muscle are NOT exposed.  Wound Description (Comments): 5.5cm x 4.5cm x 0.75cm  Present on Admission: Yes     Pressure Injury 04/19/2020 Heel Left;Right  (Active)  04/20/2020 2000  Location: Heel  Location Orientation: Left;Right  Staging: -- (recently healed pressure injury)  Wound Description (Comments):   Present on Admission: Yes   LABORATORY PANEL:  CBC Recent Labs  Lab 04/23/20 1222  WBC 11.6*  HGB 11.0*  HCT 34.2*  PLT 402*     Chemistries  Recent Labs  Lab 2020/05/21 0542 05-21-2020 1036 04/24/20 0500  NA 151*   < > 145  K 2.9*   < > 3.4*  CL 118*   < > 107  CO2 24   < > 32  GLUCOSE 412*   < > 112*  BUN 10   < > 7  CREATININE 0.37*   < > <0.30*  CALCIUM 7.5*   < > 7.5*  MG 2.0   < > 1.9  AST 13*  --   --   ALT 6  --   --   ALKPHOS 61  --   --   BILITOT 0.5  --   --    < > = values in this interval not displayed.   Cardiac Enzymes No results for input(s): TROPONINI in the last 168 hours. RADIOLOGY:  EEG  Result Date: 04/23/2020 Charlsie Quest, MD     04/23/2020  2:13 PM Patient Name: FENNA SEMEL MRN: [id number] Epilepsy Attending: Charlsie Quest Referring Physician/Provider: Dr. 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  EEG to evaluate for seizures. Level of alertness: Lethargic AEDs during EEG study: None Technical aspects: This EEG study was done with scalp electrodes positioned according to the 10-20 International system of electrode placement. Electrical activity was acquired at a sampling rate of 500Hz  and reviewed with a high frequency filter of 70Hz  and a low frequency filter of 1Hz . EEG data were recorded continuously and digitally stored. Description: EEG showed continuous generalized 3 to 5 cm in delta slowing.  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  At times these discharges appeared rhythmic with some evolution and frequency to 1.5 to 2 Hz without any evolution or morphology.  Physiologic photic driving was not seen during photic stimulation.  Hyperventilation was not performed. ABNORMALITY - Continuous slow, generalized - Periodic epileptiform discharges with triphasic morphology, generalized IMPRESSION: This study showed periodic epileptiform discharges with triphasic morphology  which at times appeared rhythmic with some evolution and frequency and not therefore on the ictal-interictal continuum.  Additionally, there is evidence of moderate to severe diffuse encephalopathy, nonspecific to etiology.  No definite seizures were seen during the study.   DG Abd 1 View  Result Date: 04/24/2020 CLINICAL DATA:  Small bowel obstruction EXAM: ABDOMEN - 1 VIEW COMPARISON:  Yesterday FINDINGS: A few loops of distended small bowel are seen in the central abdomen. Enteric contrast is seen throughout the nondilated colon. Enteric tube remains in good position. Stable aeration at the bases. No concerning mass effect or gas collection. Stable postoperative changes which are multifocal IMPRESSION: Stable findings of partial small bowel obstruction. Electronically Signed   By: M.D.   On: 04/24/2020 09:53   DG Abd Portable 1V  Result Date: 04/23/2020 CLINICAL DATA:  Small-bowel obstruction EXAM: PORTABLE ABDOMEN - 1 VIEW COMPARISON:  None. FINDINGS: Nasogastric tube is seen within the left upper quadrant of the abdomen overlying the gastric fundus. Contrast previously noted within the distal small bowel has now passed into the colon and rectum which is decompressed. Multiple gas-filled dilated loops of small bowel  are still identified within the mid abdomen and, together, the findings are compatible with a partial small bowel obstruction. No other changes seen. Inferior vena cava filter, intrathecal pain pump, and intrauterine device noted. IMPRESSION: Partial small bowel obstruction with passage of contrast into a decompressed colon and rectum. Electronically Signed   By: Helyn NumbersAshesh  Parikh MD   On: 04/23/2020 08:28   DG FL GUIDED LUMBAR PUNCTURE  Result Date: 04/12/2020 CLINICAL DATA:  Acute encephalopathy. EXAM: DIAGNOSTIC LUMBAR PUNCTURE UNDER FLUOROSCOPIC GUIDANCE FLUOROSCOPY TIME:  Fluoroscopy Time:  1 minutes 18 seconds Radiation Exposure Index (if provided by  the fluoroscopic device): 14.2 mGy Number of Acquired Spot Images: 2 PROCEDURE: After discussing the risks and benefits of this procedure with the patient's son informed consent was obtained. The patient was evaluated under fluoroscopy. The large ulceration and adjacent inflammatory tissue on the patient's lower back and sacrum is over the site that we would use for lumbar puncture making access not possible. In addition the patient's pain pump in the anterior abdomen is overlying the spine making evaluation of an access site difficult. Lumbar puncture not performed. IMPRESSION: Lumbar puncture not performed as discussed above. Electronically Signed   By: Maisie Fushomas  Register   On: 04/27/2020 16:44   ASSESSMENT AND PLAN:   Mariane MastersCharlotte M Allsbrook is a 54 y.o. 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  She also complains of diffuse abdominal pain.  Acute metabolic encephalopathy suspected due to severe hypernatremia/hypokalemia/hyperchloremia/hypomagnesimia/hypophosphatemia Suspected bacterial meningitis -came in with serum sodium 136-- 157-- rechecked 159-- dextrose drip-- 151--148--145 -pharmacy for electrolyte replacement -patient is more awake and alert. She is responding by nodding head. Syncopal words. -CT head no acute finding -neurology consultation with Dr. Otelia LimesLindzen-- recommends empiric antibiotic for bacterial meningitis for total 14 days.  -IV ampicillin and Rocephin per pharmacy x 14 days  Small bowel obstruction -improving clinically -overall x-ray seems to be improving per Dr. Aleen CampiPiscoya. Holding of surgery at present -appreciate surgical input.  --CT abdomen  shows high-grade small bowel obstruction as well as fecal impaction -Supportive care with pain control, antiemetics, IV PPI and IV fluid hydration -10/23-- discussed with  Dr. Lady Garyannon-- start NG tube feeding, wean TPN off over the weekend -speech therapy recommends clear liquid.  Nutrition Status: severe malnutrition with failure to thrive, poor PO intake Refeeding syndrome with electrolyte abnormality Nutrition Problem: Severe Malnutrition Etiology: chronic illness (COPD, quadriplegia) Signs/Symptoms: percent weight loss, severe fat depletion, severe muscle depletion Percent weight loss: 14 % Interventions: TPN  -patient will benefit from peg tube placement. Surgery has discussed with patient's son who is in agreement.-- Plans on hold at present given altered mental status - 04/21/20--started IV TPN -04/24/2020-- started NG tube feeding, wean TPN, started clear liquid, patient refusing peg tube placement  Leucocytosis with source likely Urinary tract infection vs Bowel inflammation versus bacterial meningitis  -10/21--started on ampicillin, vancomycin, Rocephin -UC no growth--mutliple species until urine culture results become available -10/21--unable to obtain lumbar puncture secondary to disrupted anatomy of the back and skin breakdown -10/23-- continue empiric bacterial meningitis treatment for 14 days per neurology  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 Patient has multiple comorbidities  DVT prophylaxis: Lovenox Code Status: Full code Family Communication: spoke with son Coriann Brouhard on the phone today 10/23 spoke with sister in the room Disposition  Plan:  to be determined Consults called: Surgery, palliative care, Neurology  Status is: Inpatient  Remains inpatient appropriate because:Hemodynamically unstable, Persistent severe electrolyte disturbances, IV treatments appropriate due to intensity of illness or inability to take PO and Inpatient level of care appropriate due to severity of illness  Pt remains with acute encephalopathy and severe electrolyte abnormality. Patient will likely need NG feeding and thereafter transition to peg feeding once hemodynamically stable   Dispo: The patient is from: Home              Anticipated d/c is to: To be determined              Anticipated d/c date is: > 3 days              Patient currently is not medically stable to d/c.  TOTAL TIME TAKING CARE OF THIS PATIENT: 25 minutes.  >50% time spent on counselling and coordination of care  Note: This dictation was prepared with Dragon dictation along with smaller phrase technology. Any transcriptional errors that result from this process are unintentional.  Enedina Finner M.D    [facility]   CC: Primary care physician; [place], Julie A, MDPatient ID: [name], female   DOB: 1965-11-20, 54 y.o.   MRN: [id number]

## 2020-04-24 NOTE — Consult Note (Signed)
PHARMACY - TOTAL PARENTERAL NUTRITION CONSULT NOTE   Indication: bowel obstruction  Patient Measurements: Height: 5\' 1"  (154.9 cm) Weight: 33.6 kg (74 lb 1.2 oz) IBW/kg (Calculated) : 47.8   Body mass index is 14 kg/m.  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  Glucose / Insulin: BG 109 - 122, 0 units SSI required Electrolytes: hypokalemia, hypophosphatemia Renal:  SCr<1, stable LFTs / TGs: LFTs wnl at baseline Prealbumin / albumin: 5.9 mg/dL / 3.5 > 2.4 g/dL Intake / Output; MIVF:  10/21 0701 - 10/22 0700 In: 2591 [I.V.:1679.6; IV Piggyback:911.4] Out: 2150 [Urine:2100; Emesis/NG output:50]   0.45% NaCl at 80 mL/hr  GI Imaging: 10/21: decreased caliber of small bowel loops 10/22: partial small bowel obstruction with passage of contrast into a decompressed colon and rectum  Surgeries / Procedures: none since TPN began  Central access: 04/21/20  TPN start date: 04/21/20   Nutritional Goals (per RD recommendation on 04/21/20): kCal: 1200 - 1400 kCal/day, Protein: 60 - 70 g/day, Fluid: 1000 - 1200 mL/day  Current Nutrition:  NPO Per surgery: If she passes clamping trial, initiate trickle tube feeding through tube  Plan:   Continue TPN at 30 mL/hr at 1800 (total volume 820 mL)  This is 1/2 of goal  Nutritional Components:  Amino Acids (10%): 52 g/L (37.4 g)  Dextrose: 15.3 % (110.2 g)  Lipids: 31.3 g/L (22.6 g)  Noted shellfish allergy but tolerates SMOF lipids based on response to previous administration  Calories: 499.7 kCal   Electrolytes in TPN: 70mEq/L of Na, 32mEq/L of K, 28mEq/L of Ca, 77mEq/L of Mg, and 19mmol/L of Phos. Cl:Ac ratio 1:1  Will give KPhos 10 mmol IV and KCl 10 mEq x 2  Add standard MVI, 100 mg thiamine and trace elements to TPN  Continue very sensitive q6h SSI and adjust as needed    Monitor TPN labs on Mon/Thurs, daily until stable  12m, PharmD, BCPS 04/24/2020,10:08 AM

## 2020-04-24 NOTE — Progress Notes (Signed)
Subjective: Now fully awake and oriented. She is watching a TV show on her laptop. Wants her NGT removed. She states that over medication "could have been" the reason for her presentation, but is not sure.   Objective: Current vital signs: BP 124/81 (BP Location: Left Arm)   Pulse 91   Temp 97.8 F (36.6 C) (Oral)   Resp 18   Ht  (1.549 m)   Wt 33.6 kg   SpO2 100%   BMI 14.00 kg/m  Vital signs in last 24 hours: Temp:  [97.2 F (36.2 C)-98.3 F (36.8 C)] 97.8 F (36.6 C) (10/23 0717) Pulse Rate:  [64-91] 91 (10/23 0717) Resp:  [15-24] 18 (10/23 0717) BP: (116-147)/(75-105) 124/81 (10/23 0717) SpO2:  [99 %-100 %] 100 % (10/23 0717) Weight:  [33.6 kg] 33.6 kg (10/23 0500)  Intake/Output from previous day: 10/22 0701 - 10/23 0700 In: 1465.4 [I.V.:677.1; IV Piggyback:788.3] Out: 2600 [Urine:2600] Intake/Output this shift: No intake/output data recorded. Nutritional status:  Diet Order            Diet NPO time specified  Diet effective now                 Physical Exam  HEENT-  Normocephalic, but with temporal wasting.   Lungs- Respirations unlabored Extremities- Decreased muscle bulk x 4 as well as contractions. No edema.   Neurological Examination Mental Status: Awake and alert with bright affect. Speech fluent with intact comprehension. Attention is normal. Oriented x 5. States her son visited her earlier today.  Cranial Nerves: II: Fixates and tracks normally.   III,IV, VI: EOMI without nystagmus. No ptosis.  VII: Smile is symmetric. VIII: Hearing intact to voice IX,X: No hypophonia XI: Head is midline XII: No lingual dysarthria.   Motor: RUE: Flexion contractures at elbow and wrist. Grip 3/5 with incomplete flexion of digits. Biceps and triceps 3/5 with decreased ROM.  LUE: Flexion contractures at elbow and wrist.Grip 4-/5 with incomplete flexion of digits. Biceps and triceps 4-/5 with decreased ROM. Marland Kitchen BLE with increased extensor tone. Unable to move  to command or stimulation. 0/5 proximally and distally.   Moderate muscle atrophy x 4.  Sensory: Intact to FT BUE and bilateral feet.  Cerebellar: No cerebellar ataxia noted to BUE.  Gait: Unable to test.   Lab Results: Results for orders placed or performed during the hospital encounter of 04/02/2020 (from the past 48 hour(s))  Glucose, capillary     Status: Abnormal   Collection Time: May 22, 2020 11:26 AM  Result Value Ref Range   Glucose-Capillary 342 (H) 70 - 99 mg/dL    Comment: Glucose reference range applies only to samples taken after fasting for at least 8 hours.   Comment 1 Notify RN   Glucose, capillary     Status: Abnormal   Collection Time: 22-May-2020  6:02 PM  Result Value Ref Range   Glucose-Capillary 196 (H) 70 - 99 mg/dL    Comment: Glucose reference range applies only to samples taken after fasting for at least 8 hours.  Basic metabolic panel     Status: Abnormal   Collection Time: 2020/05/22  6:53 PM  Result Value Ref Range   Sodium 145 135 - 145 mmol/L   Potassium 3.5 3.5 - 5.1 mmol/L   Chloride 111 98 - 111 mmol/L   CO2 22 22 - 32 mmol/L   Glucose, Bld 243 (H) 70 - 99 mg/dL    Comment: Glucose reference range applies only to samples taken after fasting  for at least 8 hours.   BUN 10 6 - 20 mg/dL   Creatinine, Ser <8.01 (L) 0.44 - 1.00 mg/dL   Calcium 7.3 (L) 8.9 - 10.3 mg/dL   GFR, Estimated NOT CALCULATED >60 mL/min    Comment: (NOTE) Calculated using the CKD-EPI Creatinine Equation (2021)    Anion gap 12 5 - 15    Comment: Performed at [facility], [address] [place], [place] [address]  Phosphorus     Status: None   Collection Time: 04/23/2020  6:53 PM  Result Value Ref Range   Phosphorus 3.7 2.5 - 4.6 mg/dL    Comment: Performed at [facility], [address] [place], [place] [address]  Glucose, capillary     Status: Abnormal   Collection Time: 04/10/2020 10:12 PM  Result Value Ref Range   Glucose-Capillary 162 (H) 70 - 99 mg/dL     Comment: Glucose reference range applies only to samples taken after fasting for at least 8 hours.  Glucose, capillary     Status: Abnormal   Collection Time: 04/19/2020 11:50 PM  Result Value Ref Range   Glucose-Capillary 137 (H) 70 - 99 mg/dL    Comment: Glucose reference range applies only to samples taken after fasting for at least 8 hours.  Basic metabolic panel     Status: Abnormal   Collection Time: 04/23/20  1:00 AM  Result Value Ref Range   Sodium 146 (H) 135 - 145 mmol/L   Potassium 2.7 (LL) 3.5 - 5.1 mmol/L    Comment: CRITICAL RESULT CALLED TO, READ BACK BY AND VERIFIED WITH MARIA JODLOMAN @0203  04/23/20 SKL    Chloride 111 98 - 111 mmol/L   CO2 26 22 - 32 mmol/L   Glucose, Bld 128 (H) 70 - 99 mg/dL    Comment: Glucose reference range applies only to samples taken after fasting for at least 8 hours.   BUN 8 6 - 20 mg/dL   Creatinine, Ser 04/25/20 (L) 0.44 - 1.00 mg/dL   Calcium 7.1 (L) 8.9 - 10.3 mg/dL   GFR, Estimated NOT CALCULATED >60 mL/min    Comment: (NOTE) Calculated using the CKD-EPI Creatinine Equation (2021)    Anion gap 9 5 - 15    Comment: Performed at [facility], [address], [place], [place]  Magnesium     Status: Abnormal   Collection Time: 04/23/20  1:00 AM  Result Value Ref Range   Magnesium 1.6 (L) 1.7 - 2.4 mg/dL    Comment: Performed at [facility], [address], [place], [place]  Phosphorus     Status: Abnormal   Collection Time: 04/23/20  1:00 AM  Result Value Ref Range   Phosphorus 2.0 (L) 2.5 - 4.6 mg/dL    Comment: Performed at [facility], [address] [place], [place]  Triglycerides     Status: None   Collection Time: 04/23/20  1:00 AM  Result Value Ref Range   Triglycerides 43 <150 mg/dL    Comment: Performed at [facility], [address] [place], [place]  Glucose, capillary     Status: Abnormal   Collection Time: 04/23/20  4:10 AM  Result Value  Ref Range   Glucose-Capillary 118 (H) 70 - 99 mg/dL    Comment: Glucose reference range applies only to samples taken after fasting for at least 8 hours.  Glucose, capillary     Status: Abnormal   Collection Time: 04/23/20  8:56 AM  Result Value Ref Range   Glucose-Capillary 109 (H) 70 - 99 mg/dL    Comment: Glucose reference range applies only to samples taken after fasting for at least 8 hours.  Glucose, capillary     Status: Abnormal   Collection Time: 04/23/20 11:39 AM  Result Value Ref Range   Glucose-Capillary 110 (H) 70 - 99 mg/dL    Comment: Glucose reference range applies only to samples taken after fasting for at least 8 hours.  CBC     Status: Abnormal   Collection Time: 04/23/20 12:22 PM  Result Value Ref Range   WBC 11.6 (H) 4.0 - 10.5 K/uL   RBC 3.94 3.87 - 5.11 MIL/uL   Hemoglobin 11.0 (L) 12.0 - 15.0 g/dL   HCT 95.2 (L) 36 - 46 %   MCV 86.8 80.0 - 100.0 fL   MCH 27.9 26.0 - 34.0 pg   MCHC 32.2 30.0 - 36.0 g/dL   RDW 84.1 (H) 32.4 - 40.1 %   Platelets 402 (H) 150 - 400 K/uL   nRBC 0.6 (H) 0.0 - 0.2 %    Comment: Performed at [facility], [address] [place], [place] [address]  Basic metabolic panel     Status: Abnormal   Collection Time: 04/23/20  2:01 PM  Result Value Ref Range   Sodium 139 135 - 145 mmol/L   Potassium 3.6 3.5 - 5.1 mmol/L   Chloride 104 98 - 111 mmol/L   CO2 27 22 - 32 mmol/L   Glucose, Bld 118 (H) 70 - 99 mg/dL    Comment: Glucose reference range applies only to samples taken after fasting for at least 8 hours.   BUN 6 6 - 20 mg/dL   Creatinine, Ser <3.66 (L) 0.44 - 1.00 mg/dL   Calcium 7.2 (L) 8.9 - 10.3 mg/dL   GFR, Estimated NOT CALCULATED >60 mL/min    Comment: (NOTE) Calculated using the CKD-EPI Creatinine Equation (2021)    Anion gap 8 5 - 15    Comment: Performed at [facility], [address] [place], [place] [address]  Glucose, capillary     Status: Abnormal   Collection Time: 04/23/20  4:28 PM   Result Value Ref Range   Glucose-Capillary 105 (H) 70 - 99 mg/dL    Comment: Glucose reference range applies only to samples taken after fasting for at least 8 hours.  Glucose, capillary     Status: Abnormal   Collection Time: 04/23/20  9:51 PM  Result Value Ref Range   Glucose-Capillary 122 (H) 70 - 99 mg/dL    Comment: Glucose reference range applies only to samples taken after fasting for at least 8 hours.  Basic metabolic panel     Status: Abnormal   Collection Time: 04/24/20  5:00 AM  Result Value Ref Range   Sodium 145 135 - 145 mmol/L   Potassium 3.4 (L) 3.5 - 5.1 mmol/L   Chloride 107 98 - 111 mmol/L   CO2 32 22 - 32 mmol/L   Glucose, Bld 112 (H) 70 - 99 mg/dL    Comment: Glucose reference range applies only to samples taken after fasting for at least 8 hours.   BUN 7 6 - 20 mg/dL   Creatinine, Ser <7.42 (L) 0.44 - 1.00 mg/dL   Calcium 7.5 (L) 8.9 - 10.3 mg/dL   GFR, Estimated NOT CALCULATED >60 mL/min    Comment: (NOTE) Calculated using the CKD-EPI Creatinine Equation (2021)    Anion gap  6 5 - 15    Comment: Performed at [facility], [address] [place], [place] [address]  Phosphorus     Status: Abnormal   Collection Time: 04/24/20  5:00 AM  Result Value Ref Range   Phosphorus 2.1 (L) 2.5 - 4.6 mg/dL    Comment: Performed at [facility], [address] [place], [place] [address]  Magnesium     Status: None   Collection Time: 04/24/20  5:00 AM  Result Value Ref Range   Magnesium 1.9 1.7 - 2.4 mg/dL    Comment: Performed at [facility], [address] [place], [place] [address]  Glucose, capillary     Status: Abnormal   Collection Time: 04/24/20  7:57 AM  Result Value Ref Range   Glucose-Capillary 109 (H) 70 - 99 mg/dL    Comment: Glucose reference range applies only to samples taken after fasting for at least 8 hours.    Recent Results (from the past 240 hour(s))  Urine culture     Status: Abnormal   Collection Time:  05/17/2020 10:19 AM   Specimen: Urine, Random  Result Value Ref Range Status   Specimen Description   Final    URINE, RANDOM Performed at [facility], [address] [place], [place] [address]    Special Requests   Final    NONE Performed at [facility], [address] [place], [place] [address]    Culture MULTIPLE SPECIES PRESENT, SUGGEST RECOLLECTION (A)  Final   Report Status 04/19/2020 FINAL  Final  Respiratory Panel by RT PCR (Flu A&B, Covid) - Nasopharyngeal Swab     Status: None   Collection Time: 2020-05-17  1:57 PM   Specimen: Nasopharyngeal Swab  Result Value Ref Range Status   SARS Coronavirus 2 by RT PCR NEGATIVE NEGATIVE Final    Comment: (NOTE) SARS-CoV-2 target nucleic acids are NOT DETECTED.  The SARS-CoV-2 RNA is generally detectable in upper respiratoy specimens during the acute phase of infection. The lowest concentration of SARS-CoV-2 viral copies this assay can detect is 131 copies/mL. A negative result does not preclude SARS-Cov-2 infection and should not be used as the sole basis for treatment or other patient management decisions. A negative result may occur with  improper specimen collection/handling, submission of specimen other than nasopharyngeal swab, presence of viral mutation(s) within the areas targeted by this assay, and inadequate number of viral copies (<131 copies/mL). A negative result must be combined with clinical observations, patient history, and epidemiological information. The expected result is Negative.  Fact Sheet for Patients:  https://www.moore.com/  Fact Sheet for Healthcare Providers:  https://www.young.biz/  This test is no t yet approved or cleared by the [place] FDA and  has been authorized for detection and/or diagnosis of SARS-CoV-2 by FDA under an Emergency Use Authorization (EUA). This EUA will remain  in effect (meaning this test can be used) for the  duration of the COVID-19 declaration under Section 564(b)(1) of the Act, 21 U.S.C. section 360bbb-3(b)(1), unless the authorization is terminated or revoked sooner.     Influenza A by PCR NEGATIVE NEGATIVE Final   Influenza B by PCR NEGATIVE NEGATIVE Final    Comment: (NOTE) The Xpert Xpress SARS-CoV-2/FLU/RSV assay is intended as an aid in  the diagnosis of influenza from Nasopharyngeal swab specimens and  should not be used as a sole basis for treatment. Nasal washings and  aspirates are unacceptable for Xpert Xpress SARS-CoV-2/FLU/RSV  testing.  Fact Sheet for Patients:  https://www.moore.com/https://www.fda.gov/media/142436/download  Fact Sheet for Healthcare Providers: https://www.young.biz/https://www.fda.gov/media/142435/download  This test is not yet approved or cleared by the [place] FDA and  has been authorized for detection and/or diagnosis of SARS-CoV-2 by  FDA under an Emergency Use Authorization (EUA). This EUA will remain  in effect (meaning this test can be used) for the duration of the  Covid-19 declaration under Section 564(b)(1) of the Act, 21  U.S.C. section 360bbb-3(b)(1), unless the authorization is  terminated or revoked. Performed at [facility], [address] [place], [place] [id number]     Lipid Panel Recent Labs    04/23/20 0100  TRIG 43    Studies/Results: EEG  Result Date: 04/23/2020 Charlsie QuestYadav, Priyanka O, MD     04/23/2020  2:13 PM Patient Name: [name] MastersCharlotte M [name] MRN: [id number] Epilepsy Attending: Charlsie QuestPriyanka O Yadav Referring Physician/Provider: Dr. 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  EEG to evaluate for seizures. Level of alertness: Lethargic AEDs during EEG study: None Technical aspects: This EEG study was done with scalp electrodes positioned according to the 10-20 International system of electrode placement. Electrical  activity was acquired at a sampling rate of 500Hz  and reviewed with a high frequency filter of 70Hz  and a low frequency filter of 1Hz . EEG data were recorded continuously and digitally stored. Description: EEG showed continuous generalized 3 to 5 cm in delta slowing.  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  At times these discharges appeared rhythmic with some evolution and frequency to 1.5 to 2 Hz without any evolution or morphology.  Physiologic photic driving was not seen during photic stimulation.  Hyperventilation was not performed. ABNORMALITY - Continuous slow, generalized - Periodic epileptiform discharges with triphasic morphology, generalized IMPRESSION: This study showed periodic epileptiform discharges with triphasic morphology which at times appeared rhythmic with some evolution and frequency and not therefore on the ictal-interictal continuum.  Additionally, there is evidence of moderate to severe diffuse encephalopathy, nonspecific to etiology.  No definite seizures were seen during the study. Charlsie QuestPriyanka O Yadav   DG Abd 1 View  Result Date: 04/24/2020 CLINICAL DATA:  Small bowel obstruction EXAM: ABDOMEN - 1 VIEW COMPARISON:  Yesterday FINDINGS: A few loops of distended small bowel are seen in the central abdomen. Enteric contrast is seen throughout the nondilated colon. Enteric tube remains in good position. Stable aeration at the bases. No concerning mass effect or gas collection. Stable postoperative changes which are multifocal IMPRESSION: Stable findings of partial small bowel obstruction. Electronically Signed   By: Marnee SpringJonathon  Watts M.D.   On: 04/24/2020 09:53   DG Abd Portable 1V  Result Date: 04/23/2020 CLINICAL DATA:  Small-bowel obstruction EXAM: PORTABLE ABDOMEN - 1 VIEW COMPARISON:  None. FINDINGS: Nasogastric tube is seen within the left upper quadrant of the abdomen overlying the gastric fundus. Contrast previously  noted within the distal small bowel has now passed into the colon and rectum which is decompressed. Multiple gas-filled dilated loops of small bowel are still identified within the mid abdomen and, together, the findings are compatible with a partial small bowel obstruction. No other changes seen. Inferior vena cava filter, intrathecal pain pump, and intrauterine device noted. IMPRESSION: Partial small bowel obstruction with passage of contrast into a decompressed colon and rectum. Electronically Signed   By: Helyn NumbersAshesh  Parikh MD   On: 04/23/2020 08:28   DG FL GUIDED LUMBAR  PUNCTURE  Result Date: 04/10/2020 CLINICAL DATA:  Acute encephalopathy. EXAM: DIAGNOSTIC LUMBAR PUNCTURE UNDER FLUOROSCOPIC GUIDANCE FLUOROSCOPY TIME:  Fluoroscopy Time:  1 minutes 18 seconds Radiation Exposure Index (if provided by the fluoroscopic device): 14.2 mGy Number of Acquired Spot Images: 2 PROCEDURE: After discussing the risks and benefits of this procedure with the patient's son informed consent was obtained. The patient was evaluated under fluoroscopy. The large ulceration and adjacent inflammatory tissue on the patient's lower back and sacrum is over the site that we would use for lumbar puncture making access not possible. In addition the patient's pain pump in the anterior abdomen is overlying the spine making evaluation of an access site difficult. Lumbar puncture not performed. IMPRESSION: Lumbar puncture not performed as discussed above. Electronically Signed   By: Maisie Fus  Register   On: 04/30/2020 16:44    Medications:  Scheduled: . Chlorhexidine Gluconate Cloth  6 each Topical Daily  . enoxaparin (LOVENOX) injection  40 mg Subcutaneous Q24H  . fluticasone  2 spray Each Nare Daily  . fluticasone  2 puff Inhalation BID  . influenza vac split quadrivalent PF  0.5 mL Intramuscular Tomorrow-1000  . insulin aspart  0-6 Units Subcutaneous Q4H  . tiotropium  18 mcg Inhalation Daily   Continuous: . sodium chloride 10  mL/hr at 04/24/20 0143  . ampicillin (OMNIPEN) IV 2 g (04/24/20 0530)  . cefTRIAXone (ROCEPHIN)  IV 2 g (04/24/20 0958)  . feeding supplement (OSMOLITE 1.5 CAL)    . potassium chloride    . potassium PHOSPHATE IVPB (in mmol)    . TPN ADULT (ION) 30 mL/hr at 04/24/20 0143  . TPN ADULT (ION)    . vancomycin     EEG (10/22): ABNORMALITY - Continuous slow, generalized - Periodic epileptiform discharges with triphasic morphology, generalized IMPRESSION: This study showed periodic epileptiform discharges with triphasic morphology which at times appeared rhythmic with some evolution and frequency and not therefore on the ictal-interictal continuum.  Additionally, there is evidence of moderate to severe diffuse encephalopathy, nonspecific to etiology.  No definite seizures were seen during the study.  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 Home medications with sedating effect have been discontinued. She is on empiric antibiotics for presumed meningitis. Unable to obtain LP due to pain pump and decubitus ulcer going up the mid-back.  1. Exam on Thursday revealed an awake, severely encephalopathic patient with UMN findings consistent with her chronic tetraplegia; no jerking or twitching to suggest seizure activity. Exam today reveals an awake and alert patient who is fully oriented without aphasia. BUE spastic paresis and plegic BLE with increased tone are noted. She states that she is back to her cognitive and motor baseline.  2. CT head: No acute finding or change from 2009 3. DDx for her encephalopathy included toxic/metabolic and infectious etiologies. Meningitis is also possible and her improvement today may be in response to her antibiotic regimen.   4. EEG obtained yesterday: Continuous generalized slowing was consistent with an encephalopathy. There were periodic epileptiform discharges with  triphasic morphology which at times appeared rhythmic with some evolution, falling along the ictal-interictal spectrum. However, no definite seizures were seen during the recording.    Recommendations: 1. Continue meningitis-dose antibiotics for possible meningitis. Pharmacy has been consulted. On vancomycin, ceftriaxone and ampicillin. Will need to continue empiric meningitis-dose ABX for full course. 2. Correct metabolic derangements. Nutrition is consulting. At risk for refeeding syndrome. 3. Neurology will  sign off. Please call if there are additional questions.     LOS: 6 days    signed: Dr. Caryl Pina 04/24/2020  11:03 AM

## 2020-04-24 NOTE — Progress Notes (Addendum)
Nutrition Follow-up  DOCUMENTATION CODES:   Severe malnutrition in context of chronic illness  INTERVENTION:   Monitor magnesium, potassium, and phosphorus daily for at least 3 days, MD to replete as needed, as pt is at risk for refeeding syndrome.  -TPN management per Pharmacy  Initiate TF via NGT: -Osmolite 1.5 @ 20 ml/hr, advance by 10 ml every 12 hours to goal rate of 40 ml/hr. -Provides at goal: 1440 kcals, 60g protein and 731 ml H2O. -Once TPN weaned, recommend MVI via tube  NUTRITION DIAGNOSIS:   Severe Malnutrition related to chronic illness (COPD, quadriplegia) as evidenced by percent weight loss, severe fat depletion, severe muscle depletion.  Ongoing  GOAL:   Patient will meet greater than or equal to 90% of their needs  Progressing with nutrition support.  MONITOR:   Diet advancement, Labs, Weight trends, Skin, I & O's, Other (Comment) (TPN)  REASON FOR ASSESSMENT:   Consult (verbal: Dr. Allena Katz) Enteral/tube feeding initiation and management  ASSESSMENT:   54 y.o. 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  10/17: admitted for SBO 10/20: TPN initiated via PICC  Paged by Dr. Allena Katz to initiate trickle feeds via NGT and slowly advance to ideally wean pt off TPN this weekend. Plan per MD, is for NGT feeds this weekend and swallow evaluation on Monday. PEG is still in consideration, pending results of swallow study. TF initiated as above. Pt is still showing signs of refeeding syndrome per labs. Would continue to monitor.  TPN infusing at 30 ml/hr, providing 750 kcals and 37g protein.  Admission weight: 74 lbs (10/20) Last recorded weight: 74 lbs (10/23)  Medications: IV KCl, IV K-Phos, 100 mg Thiamin via TPN  Labs reviewed: CBGs: 109-122 Low K, Phos Mg WNL  Diet Order:   Diet Order            Diet NPO time specified  Diet  effective now                 EDUCATION NEEDS:   Not appropriate for education at this time  Skin:  Skin Assessment: Reviewed RN Assessment (Left heel: 0.1cm x 0.2cm x 0.1cm, Right heel: healed, Right IT: Stage 3 with 2cm x 3.5cm x 0.1cm pink wound in the center of a 5cm x 4cm previous wound (now scar tissue); undermining measuring 0.6cm at distal margin, Sacrum: 6.5cm x 15cm x 0.3cm)  Last BM:  10/19  Height:   Ht Readings from Last 1 Encounters:  04/21/20 5\' 1"  (1.549 m)    Weight:   Wt Readings from Last 1 Encounters:  04/24/20 33.6 kg   BMI:  Body mass index is 14 kg/m.  Estimated Nutritional Needs:   Kcal:  1200-1400kcal/day  Protein:  60-70g/day  Fluid:  1-1.2L/day  04/26/20, MS, RD, LDN Inpatient Clinical Dietitian Contact information available via Amion

## 2020-04-25 ENCOUNTER — Inpatient Hospital Stay: Payer: Medicare Other

## 2020-04-25 DIAGNOSIS — K56609 Unspecified intestinal obstruction, unspecified as to partial versus complete obstruction: Secondary | ICD-10-CM | POA: Diagnosis not present

## 2020-04-25 LAB — GLUCOSE, CAPILLARY
Glucose-Capillary: 134 mg/dL — ABNORMAL HIGH (ref 70–99)
Glucose-Capillary: 140 mg/dL — ABNORMAL HIGH (ref 70–99)
Glucose-Capillary: 149 mg/dL — ABNORMAL HIGH (ref 70–99)
Glucose-Capillary: 153 mg/dL — ABNORMAL HIGH (ref 70–99)
Glucose-Capillary: 179 mg/dL — ABNORMAL HIGH (ref 70–99)

## 2020-04-25 LAB — PHOSPHORUS: Phosphorus: 3.6 mg/dL (ref 2.5–4.6)

## 2020-04-25 MED ORDER — PANTOPRAZOLE SODIUM 40 MG PO PACK
20.0000 mg | PACK | Freq: Every day | ORAL | Status: DC
  Administered 2020-04-25 – 2020-04-28 (×4): 20 mg
  Filled 2020-04-25 (×5): qty 20

## 2020-04-25 MED ORDER — TRAVASOL 10 % IV SOLN
INTRAVENOUS | Status: AC
  Filled 2020-04-25: qty 374.4

## 2020-04-25 MED ORDER — OMEPRAZOLE 2 MG/ML ORAL SUSPENSION: 20.0000 mg | Freq: Every day | ORAL | Status: DC

## 2020-04-25 MED ORDER — MIRTAZAPINE 15 MG PO TBDP
30.0000 mg | ORAL_TABLET | Freq: Every day | ORAL | Status: DC
  Administered 2020-04-25 – 2020-04-28 (×4): 30 mg via ORAL
  Filled 2020-04-25 (×4): qty 2

## 2020-04-25 MED ORDER — ACETAMINOPHEN 325 MG PO TABS
650.0000 mg | ORAL_TABLET | Freq: Four times a day (QID) | ORAL | Status: DC | PRN
  Administered 2020-04-25 – 2020-04-26 (×2): 650 mg via ORAL
  Filled 2020-04-25 (×2): qty 2

## 2020-04-25 MED ORDER — BOOST / RESOURCE BREEZE PO LIQD CUSTOM
1.0000 | Freq: Three times a day (TID) | ORAL | Status: DC
  Administered 2020-04-25 – 2020-04-26 (×3): 1 via ORAL

## 2020-04-25 MED ORDER — ALUM & MAG HYDROXIDE-SIMETH 200-200-20 MG/5ML PO SUSP
15.0000 mL | Freq: Four times a day (QID) | ORAL | Status: DC | PRN
  Administered 2020-04-25 – 2020-04-26 (×2): 15 mL via ORAL
  Filled 2020-04-25: qty 30

## 2020-04-25 MED ORDER — ALUM & MAG HYDROXIDE-SIMETH 200-200-20 MG/5 ML NICU TOPICAL
1.0000 "application " | Freq: Four times a day (QID) | TOPICAL | Status: DC | PRN
  Filled 2020-04-25: qty 355

## 2020-04-25 NOTE — Progress Notes (Signed)
   04/25/20 0900  Assess: MEWS Score  Temp 98.6 F (37 C)  BP 108/81  Pulse Rate (!) 118  Resp 20  Level of Consciousness Alert  SpO2 99 %  O2 Device Room Air  Assess: MEWS Score  MEWS Temp 0  MEWS Systolic 0  MEWS Pulse 2  MEWS RR 0  MEWS LOC 0  MEWS Score 2  MEWS Score Color Yellow  Assess: if the MEWS score is Yellow or Red  Were vital signs taken at a resting state? Yes  Focused Assessment No change from prior assessment  Early Detection of Sepsis Score *See Row Information* Low  MEWS guidelines implemented *See Row Information* Yes  Treat  MEWS Interventions Administered scheduled meds/treatments  Pain Scale 0-10  Pain Score 5  Pain Type Chronic pain  Pain Location Sacrum  Pain Orientation Mid;Right;Left  Pain Descriptors / Indicators Sharp  Pain Frequency Constant  Pain Onset On-going  Pain Intervention(s) Medication (See eMAR)  Take Vital Signs  Increase Vital Sign Frequency  Yellow: Q 2hr X 2 then Q 4hr X 2, if remains yellow, continue Q 4hrs  Escalate  MEWS: Escalate Yellow: discuss with charge nurse/RN and consider discussing with provider and RRT  Notify: Charge Nurse/RN  Name of Charge Nurse/RN Notified Anabella  Date Charge Nurse/RN Notified 04/25/20  Time Charge Nurse/RN Notified [id number]  Notify: Provider  Provider Name/Title Amery  Date Provider Notified 04/25/20  Time Provider Notified [phone]  Notification Type Page  Notification Reason Change in status (tachycardia)  Response No new orders  Date of Provider Response 04/25/20  Time of Provider Response 0908  Document  Patient Outcome Stabilized after interventions  Patient MEWS yellow after VS taken. Pt not in distress just tachycardiac. Pulse has been fluctuating. Manual pulse was taken at this time. Pt medications given as ordered. Will continue to monitor.

## 2020-04-25 NOTE — Plan of Care (Addendum)
Pt noted to have 10/10 abdominal pain at this time. MD Amery notified via page and instant message. Prior to pain, pt had large BM, loose. Pt states it feels like abdominal cramping prior to having a BM. Pt given zofran 4mg  for nausea and maalox order placed. MD states to pace order for protonix 40mg  per tube, and order KUB of abdomen. Orders placed. Pt states cramping feels a little better but would like something for pain. Asked MD for pain medication.  Pt noted to be diaphoretic, sugar was 134. Tube feedings changed at this time, pt is at goal at this time 71ml/hr. MD states to hold clears, placed pt back to NPO. Will continue to monitor closely.   Update: MD ordered tylenol 650mg  q6hpp and fever.

## 2020-04-25 NOTE — Progress Notes (Signed)
PROGRESS NOTE    [name]  TML:[id number] DOB: 1966-06-08 DOA: 04/26/2020 PCP: [place], Julie A, MD    Brief Narrative:  [name] a 54 y.o.femalewith medical history significant forCOPD, quadriplegia status post spinal 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 She also complains of diffuse abdominal pain.  Found with small bowel obstruction    Consultants:   Surgery, neurology  Procedures:   Antimicrobials:       Subjective: Patient wants to feeding out that her goal today.  Did a swallowing eval with pass clear liquids.  States had a bowel movement and passing gas  Objective: Vitals:   04/24/20 2330 04/25/20 0428 04/25/20 0439 04/25/20 0500  BP: 130/80 105/62 108/62   Pulse: 98 (!) 124 (!) 104   Resp: 18 20 20    Temp: 98.4 F (36.9 C) 99.6 F (37.6 C) 98.9 F (37.2 C)   TempSrc:  Oral    SpO2: 100% 97% 97%   Weight:    33.6 kg  Height:        Intake/Output Summary (Last 24 hours) at 04/25/2020 0809 Last data filed at 04/25/2020 0600 Gross per 24 hour  Intake 2402.13 ml  Output 2000 ml  Net 402.13 ml   Filed Weights   04/21/20 0511 04/24/20 0500 04/25/20 0500  Weight: 33.9 kg 33.6 kg 33.6 kg    Examination:  General exam: Appears calm and comfortable  Respiratory system: Clear to auscultation. Respiratory effort normal. Cardiovascular system: S1 & S2 heard, RRR. No JVD, murmurs, rubs, gallops or clicks. No pedal edema. Gastrointestinal system: Abdomen is mild distention soft and nontender.  Hyperactive bowel sounds Central nervous system: awake and alert  Grossly intact Extremities: No edema Skin: Warm dry Psychiatry:  Mood & affect appropriate in current setting.     Data Reviewed: I have personally reviewed following labs and imaging studies  CBC: Recent Labs  Lab 04/30/2020 1109 04/19/20 0443 04/21/20 0415 04/10/2020 0542  04/23/20 1222  WBC 5.6 17.9* 19.3* 15.8* 11.6*  NEUTROABS 4.1  --   --  11.9*  --   HGB 13.2 9.9* 9.4* 9.4* 11.0*  HCT 41.2 31.1* 30.5* 28.9* 34.2*  MCV 86.2 89.4 91.9 87.3 86.8  PLT 477* 407* 469* 465* 402*   Basic Metabolic Panel: Recent Labs  Lab 04/21/20 0415 04/21/20 0415 04/21/20 1138 04/21/20 1929 04/16/2020 0542 04/26/2020 0542 04/23/2020 1036 29-Apr-2020 1853 04/23/20 0100 04/23/20 1401 04/24/20 0500  NA 157*   < > 159*   < > 151*   < > 148* 145 146* 139 145  K 3.0*  --   --    < > 2.9*   < > 2.9* 3.5 2.7* 3.6 3.4*  CL 118*  --   --    < > 118*   < > 113* 111 111 104 107  CO2 18*  --   --    < > 24   < > 24 22 26 27  32  GLUCOSE 86  --   --    < > 412*   < > 393* 243* 128* 118* 112*  BUN 11  --   --    < > 10   < > 10 10 8 6 7   CREATININE 0.44  --   --    < > 0.37*   < > 0.33* <0.30* <0.30* <0.30* <0.30*  CALCIUM 8.2*  --   --    < >  7.5*   < > 7.7* 7.3* 7.1* 7.2* 7.5*  MG 1.6*  --   --   --  2.0  --   --   --  1.6*  --  1.9  PHOS  --   --  3.3  --  1.3*  --   --  3.7 2.0*  --  2.1*   < > = values in this interval not displayed.   GFR: CrCl cannot be calculated (This lab value cannot be used to calculate CrCl because it is not a number: <0.30). Liver Function Tests: Recent Labs  Lab Apr 05, 2020 1109 04/16/2020 0542  AST 13* 13*  ALT 8 6  ALKPHOS 72 61  BILITOT 0.9 0.5  PROT 8.6* 5.9*  ALBUMIN 3.5 2.4*   Recent Labs  Lab Apr 05, 2020 1109  LIPASE 32   Recent Labs  Lab 04/21/20 1138  AMMONIA 40*   Coagulation Profile: No results for input(s): INR, PROTIME in the last 168 hours. Cardiac Enzymes: No results for input(s): CKTOTAL, CKMB, CKMBINDEX, TROPONINI in the last 168 hours. BNP (last 3 results) No results for input(s): PROBNP in the last 8760 hours. HbA1C: No results for input(s): HGBA1C in the last 72 hours. CBG: Recent Labs  Lab 04/23/20 2151 04/24/20 0757 04/24/20 1203 04/24/20 1614 04/24/20 2047  GLUCAP 122* 109* 94 110* 134*   Lipid  Profile: Recent Labs    04/23/20 0100  TRIG 43   Thyroid Function Tests: No results for input(s): TSH, T4TOTAL, FREET4, T3FREE, THYROIDAB in the last 72 hours. Anemia Panel: No results for input(s): VITAMINB12, FOLATE, FERRITIN, TIBC, IRON, RETICCTPCT in the last 72 hours. Sepsis Labs: Recent Labs  Lab Apr 05, 2020 1408 04/21/20 1138  LATICACIDVEN 0.6 0.9    Recent Results (from the past 240 hour(s))  Urine culture     Status: Abnormal   Collection Time: Apr 05, 2020 10:19 AM   Specimen: Urine, Random  Result Value Ref Range Status   Specimen Description   Final    URINE, RANDOM Performed at [facility], [address] [place], [place] [address]    Special Requests   Final    NONE Performed at [facility], [address] [place], [place] [address]    Culture MULTIPLE SPECIES PRESENT, SUGGEST RECOLLECTION (A)  Final   Report Status 04/19/2020 FINAL  Final  Respiratory Panel by RT PCR (Flu A&B, Covid) - Nasopharyngeal Swab     Status: None   Collection Time: Apr 05, 2020  1:57 PM   Specimen: Nasopharyngeal Swab  Result Value Ref Range Status   SARS Coronavirus 2 by RT PCR NEGATIVE NEGATIVE Final    Comment: (NOTE) SARS-CoV-2 target nucleic acids are NOT DETECTED.  The SARS-CoV-2 RNA is generally detectable in upper respiratoy specimens during the acute phase of infection. The lowest concentration of SARS-CoV-2 viral copies this assay can detect is 131 copies/mL. A negative result does not preclude SARS-Cov-2 infection and should not be used as the sole basis for treatment or other patient management decisions. A negative result may occur with  improper specimen collection/handling, submission of specimen other than nasopharyngeal swab, presence of viral mutation(s) within the areas targeted by this assay, and inadequate number of viral copies (<131 copies/mL). A negative result must be combined with clinical observations, patient history, and  epidemiological information. The expected result is Negative.  Fact Sheet for Patients:  https://www.moore.com/https://www.fda.gov/media/142436/download  Fact Sheet for Healthcare Providers:  https://www.young.biz/https://www.fda.gov/media/142435/download  This test is no t yet approved or cleared by the [place]  FDA and  has been authorized for detection and/or diagnosis of SARS-CoV-2 by FDA under an Emergency Use Authorization (EUA). This EUA will remain  in effect (meaning this test can be used) for the duration of the COVID-19 declaration under Section 564(b)(1) of the Act, 21 U.S.C. section 360bbb-3(b)(1), unless the authorization is terminated or revoked sooner.     Influenza A by PCR NEGATIVE NEGATIVE Final   Influenza B by PCR NEGATIVE NEGATIVE Final    Comment: (NOTE) The Xpert Xpress SARS-CoV-2/FLU/RSV assay is intended as an aid in  the diagnosis of influenza from Nasopharyngeal swab specimens and  should not be used as a sole basis for treatment. Nasal washings and  aspirates are unacceptable for Xpert Xpress SARS-CoV-2/FLU/RSV  testing.  Fact Sheet for Patients: https://www.moore.com/  Fact Sheet for Healthcare Providers: https://www.young.biz/  This test is not yet approved or cleared by the [place] FDA and  has been authorized for detection and/or diagnosis of SARS-CoV-2 by  FDA under an Emergency Use Authorization (EUA). This EUA will remain  in effect (meaning this test can be used) for the duration of the  Covid-19 declaration under Section 564(b)(1) of the Act, 21  U.S.C. section 360bbb-3(b)(1), unless the authorization is  terminated or revoked. Performed at [facility], [address], [place], [place] [address]          Radiology Studies: EEG  Result Date: 04/23/2020 Charlsie Quest, MD     04/23/2020  2:13 PM Patient Name: [name] MRN: [id number] Epilepsy Attending: Charlsie Quest Referring Physician/Provider: Dr.  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  EEG to evaluate for seizures. Level of alertness: Lethargic AEDs during EEG study: None Technical aspects: This EEG study was done with scalp electrodes positioned according to the 10-20 International system of electrode placement. Electrical activity was acquired at a sampling rate of  and reviewed with a high frequency filter of  and a low frequency filter of . EEG data were recorded continuously and digitally stored. Description: EEG showed continuous generalized 3 to 5 cm in delta slowing.  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  At times these discharges appeared rhythmic with some evolution and frequency to 1.5 to 2 Hz without any evolution or morphology.  Physiologic photic driving was not seen during photic stimulation.  Hyperventilation was not performed. ABNORMALITY - Continuous slow, generalized - Periodic epileptiform discharges with triphasic morphology, generalized IMPRESSION: This study showed periodic epileptiform discharges with triphasic morphology which at times appeared rhythmic with some evolution and frequency and not therefore on the ictal-interictal continuum.  Additionally, there is evidence of moderate to severe diffuse encephalopathy, nonspecific to etiology.  No definite seizures were seen during the study. Charlsie Quest   DG Abd 1 View  Result Date: 04/24/2020 CLINICAL DATA:  Small bowel obstruction EXAM: ABDOMEN - 1 VIEW COMPARISON:  Yesterday FINDINGS: A few loops of distended small bowel are seen in the central abdomen. Enteric contrast is seen throughout the nondilated colon. Enteric tube remains in good position. Stable aeration at the bases. No concerning mass effect or gas collection. Stable postoperative  changes which are multifocal IMPRESSION: Stable findings of partial small bowel obstruction. Electronically Signed   By: Marnee Spring M.D.   On: 04/24/2020 09:53   DG Abd Portable 1V  Result Date: 04/23/2020 CLINICAL DATA:  Small-bowel obstruction EXAM: PORTABLE ABDOMEN - 1 VIEW COMPARISON:  None. FINDINGS: Nasogastric tube is seen within the left upper quadrant of the abdomen overlying the gastric fundus. Contrast previously noted within the distal small bowel has now passed into the colon and rectum which is decompressed. Multiple gas-filled dilated loops of small bowel are still identified within the mid abdomen and, together, the findings are compatible with a partial small bowel obstruction. No other changes seen. Inferior vena cava filter, intrathecal pain pump, and intrauterine device noted. IMPRESSION: Partial small bowel obstruction with passage of contrast into a decompressed colon and rectum. Electronically Signed   By: Helyn Numbers MD   On: 04/23/2020 08:28        Scheduled Meds: . Chlorhexidine Gluconate Cloth  6 each Topical Daily  . enoxaparin (LOVENOX) injection  40 mg Subcutaneous Q24H  . fluticasone  2 spray Each Nare Daily  . fluticasone  2 puff Inhalation BID  . influenza vac split quadrivalent PF  0.5 mL Intramuscular Tomorrow-1000  . insulin aspart  0-6 Units Subcutaneous Q4H  . tiotropium  18 mcg Inhalation Daily   Continuous Infusions: . sodium chloride Stopped (04/25/20 0001)  . ampicillin (OMNIPEN) IV 300 mL/hr at 04/25/20 0537  . cefTRIAXone (ROCEPHIN)  IV Stopped (04/24/20 2359)  . feeding supplement (OSMOLITE 1.5 CAL) 30 mL/hr at 04/25/20 0201  . TPN ADULT (ION) 30 mL/hr at 04/25/20 0537    Assessment & Plan:   Principal Problem:   SBO (small bowel obstruction) (HCC) Active Problems:   COPD (chronic obstructive pulmonary disease) (HCC)   History of urostomy   Paraplegic spinal paralysis (HCC)   Urinary tract infection without hematuria   Sacral  decubitus ulcer, stage III (HCC)   Pressure ulcer of back   Hypokalemia   Leukocytosis   Protein-calorie malnutrition, severe   Goals of care, counseling/discussion   Palliative care by specialist   Electrolyte abnormality   [name] a 54 y.o.femalewith medical history significant forCOPD, quadriplegia status post spinal 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 She also complains of diffuse abdominal pain.  Acute metabolic encephalopathy suspected due to severe hypernatremia/hypokalemia/hyperchloremia/hypomagnesimia/hypophosphatemia Suspected bacterial meningitis -came in with serum sodium 136-- 157-- rechecked 159-- dextrose drip-- 151--148--145 -pharmacy for electrolyte replacement CT head no acute finding Unable to do LP secondary to disrupted anatomy of the back and skin breakdown EEG -continuous generalized slowing was consistent with encephalopathy.  There were periodic epileptiform discharges with triphasic morphology which at times appear rhythmic with some evolution, falling along the ictalinterictal spectrum.  No definite seizures were seen Differential diagnosis after encephalopathy includes toxic/metabolic and infectious etiology.  Meningitis also possible in her improvement could be from response to her antibiotic regimen -Continue correcting metabolic derangement Per neurology continue meningitis antibiotics treatment for possible meningitis for total of 14 days Continue IV ampicillin and Rocephin per pharmacy x14 days Minimizing sedating some pain meds Neurology signed off   Small bowel obstruction -CT of the abdomen found with high-grade small bowel obstruction  clinically improving On tube feeding 30 mils per hour  Per surgery is appropriate to initiate trophic tube feeding PEG tube may be considered, however patient had expressed no desire of having this placed  when speaking with Dr. Lady Gary Speech path following please see note-recommends clear liquid Overall x-ray improving and holding off on surgery at this time Surgery cleared patient to be started on enteral intake  will start on clear liquids per speech pathology recommendation.  Need to monitor her caloric intake.  We will continue TPN while enteral intake is being evaluated Serial abdominal exams Minimize narcotics Serial KUB as needed Correct electrolytes as needed-labs pending   Nutrition Status: severe malnutrition with failure to thrive, poor PO intake Refeeding syndrome with electrolyte abnormality -At Severe Malnutrition-2/2 chronic illness (COPD, quadriplegia) On TPN we will continue while enteral intake is being evaluated to make sure she is meeting her caloric intake orally Patient may benefit from PEG tube placement however per Dr. 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 Continue supportive care  COPD Without acute exacerbation  Continue inhaled steroids and Spiriva    Chronic Decubitus ulcers(POA) -Patient has a stage III pressure injury to sacrum, stage II pressure injury in the back of shoulder and stage II pressure injury of the back of her thigh -Continue with wound care recommendations  Patient needs to turn frequently to prevent development of further pressure ulcers  Palliative care consulted     DVT prophylaxis: Lovenox Code Status: Full Family Communication: None at bedside  Status is: Inpatient  Remains  inpatient appropriate because:IV treatments appropriate due to intensity of illness or inability to take PO   Dispo: The patient is from: Home              Anticipated d/c is to: TBD              Anticipated d/c date is: > 3 days              Patient currently is not medically stable to d/c.            LOS: 7 days   Time spent: 35 minutes with more than 50% on COC    Lynn Ito, MD [facility] Pager [phone]  If 7PM-7AM, please contact night-coverage [email or web address] Password [email or web address] 04/25/2020, 8:09 AM

## 2020-04-25 NOTE — Consult Note (Signed)
PHARMACY - TOTAL PARENTERAL NUTRITION CONSULT NOTE   Indication: bowel obstruction  Patient Measurements: Height: 5\' 1"  (154.9 cm) Weight: 33.6 kg (74 lb 1.2 oz) IBW/kg (Calculated) : 47.8   Body mass index is 14 kg/m.  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  Glucose / Insulin: BG 109 - 122, 0 units SSI required Electrolytes: hypokalemia, hypophosphatemia Renal:  SCr<1, stable LFTs / TGs: LFTs wnl at baseline Prealbumin / albumin: 5.9 mg/dL / 3.5 > 2.4 g/dL Intake / Output; MIVF:  10/21 0701 - 10/22 0700 In: 2591 [I.V.:1679.6; IV Piggyback:911.4] Out: 2150 [Urine:2100; Emesis/NG output:50]   0.45% NaCl at 80 mL/hr  GI Imaging: 10/21: decreased caliber of small bowel loops 10/22: partial small bowel obstruction with passage of contrast into a decompressed colon and rectum  Surgeries / Procedures: none since TPN began  Central access: 04/21/20  TPN start date: 04/21/20   Nutritional Goals (per RD recommendation on 04/21/20): kCal: 1200 - 1400 kCal/day, Protein: 60 - 70 g/day, Fluid: 1000 - 1200 mL/day  Current Nutrition:  NPO Per surgery: If she passes clamping trial, initiate trickle tube feeding through tube  Plan:   Continue TPN at 30 mL/hr at 1800 (total volume 820 mL) due to risk of refeeding. F/u with furthering to goal rate. Labs ordered for tomorrow. PEG tube elevation for Monday.   This is 1/2 of goal  Nutritional Components:  Amino Acids (10%): 52 g/L (37.4 g)  Dextrose: 15.3 % (110.2 g)  Lipids: 31.3 g/L (22.6 g)  Noted shellfish allergy but tolerates SMOF lipids based on response to previous administration  Calories: 499.7 kCal   Electrolytes in TPN: 81mEq/L of Na, 57mEq/L of K, 25mEq/L of Ca, 44mEq/L of Mg, and 89mmol/L of Phos. Cl:Ac ratio 1:1  Add standard MVI, 100 mg thiamine and trace elements  to TPN  Continue very sensitive q6h SSI and adjust as needed   Monitor TPN labs on Mon/Thurs, daily until stable  12m, PharmD, BCPS 04/25/2020,9:52 AM

## 2020-04-25 NOTE — Progress Notes (Signed)
[facility] SURGICAL PROGRESS NOTE (cpt [phone])  Hospital Day(s): 7.   Post op day(s): n/a.   Interval History: She is completely alert today.  Tube feeds are currently running at 30 cc an hour and she is tolerating this without difficulty.  She wants the NG tube out, however.  She underwent a swallow evaluation and was cleared for thin liquids.   Vital signs in last 24 hours: [min-max] current  Temp:  [97.5 F (36.4 C)-99.6 F (37.6 C)] 98.6 F (37 C) (10/24 0900) Pulse Rate:  [94-124] 118 (10/24 0900) Resp:  [18-20] 20 (10/24 0900) BP: (98-135)/(61-85) 108/81 (10/24 0900) SpO2:  [97 %-100 %] 99 % (10/24 0900) Weight:  [33.6 kg] 33.6 kg (10/24 0500)     Height: 5\' 1"  (154.9 cm) Weight: 33.6 kg BMI (Calculated): 14   Intake/Output last 2 shifts:  10/23 0701 - 10/24 0700 In: 2402.1 [P.O.:60; I.V.:911.3; NG/GT:382.7; IV Piggyback:1048.2] Out: 2000 [Urine:2000]   Physical Exam:  Constitutional:Alert and in no acute distress HENT: normocephalic without obvious abnormality, NGT in place Eyes: PERRL, EOM's grossly intact and symmetric  Respiratory: breathing non-labored at rest  Cardiovascular: regular rate and sinus rhythm  Gastrointestinal:Soft,nontender, non-distended, no rebound/guarding, urostomy in place in LLQ.Patient has a right lower quadrant subcutaneous baclofen pump Musculoskeletal:Chronic atrophy secondary to history of paraplegia   Labs:  CBC Latest Ref Rng & Units 04/23/2020 03-May-2020 04/21/2020  WBC 4.0 - 10.5 K/uL 11.6(H) 15.8(H) 19.3(H)  Hemoglobin 12.0 - 15.0 g/dL 11.0(L) 9.4(L) 9.4(L)  Hematocrit 36 - 46 % 34.2(L) 28.9(L) 30.5(L)  Platelets 150 - 400 K/uL 402(H) 465(H) 469(H)   CMP Latest Ref Rng & Units 04/24/2020 04/23/2020 04/23/2020  Glucose 70 - 99 mg/dL 04/25/2020) 510(C) 585(I)  BUN 6 - 20 mg/dL 7 6 8   Creatinine 0.44 - 1.00 mg/dL 778(E) ) <4.23(N)  Sodium 135 - 145 mmol/L 145 139 146(H)  Potassium 3.5 - 5.1 mmol/L 3.4(L)  3.6 2.7(LL)  Chloride 98 - 111 mmol/L 107 104 111  CO2 22 - 32 mmol/L 32 27 26  Calcium 8.9 - 10.3 mg/dL 7.5(L) 7.2(L) 7.1(L)  Total Protein 6.5 - 8.1 g/dL - - -  Total Bilirubin 0.3 - 1.2 mg/dL - - -  Alkaline Phos 38 - 126 U/L - - -  AST 15 - 41 U/L - - -  ALT 0 - 44 U/L - - -    Imaging studies:  KUB (04/24/2020) personally reviewed which appears markedly improved with contrast throughout colon, and radiologist report reviewed below:  FINDINGS: A few loops of distended small bowel are seen in the central abdomen. Enteric contrast is seen throughout the nondilated colon. Enteric tube remains in good position. Stable aeration at the bases. No concerning mass effect or gas collection. Stable postoperative changes which are multifocal  IMPRESSION: Stable findings of partial small bowel obstruction  Assessment/Plan: (ICD-10's: K3.609) 54 y.o. female with recent altered mental status now resolved significantly and numerous electrolyte derangements, most likely secondary to refeeding, but clinical and radiologic improvement small bowel obstruction, most likely secondary to post-surgical adhesions, complicated by history of paraplegia, malnutrition, deconditioning.    -She is tolerating tube feeds, but very much would like to have the NG tube removed.  I am concerned that she will not meet her caloric needs orally.  As she has passed a swallow evaluation, I think we can initiate an oral diet and obtain calorie counts.  - Continue TPN while enteral intake is being evaluated - Monitor abdominal examination; on-going bowel  function - Pain control prn (minimize narcotics if feasible); antiemetics prn - Serial KUBs as needed             - Further management per primary service; we will follow   All of the above findings and recommendations were discussed with the medical team.

## 2020-04-26 ENCOUNTER — Inpatient Hospital Stay: Payer: Medicare Other

## 2020-04-26 ENCOUNTER — Encounter: Payer: Self-pay | Admitting: Internal Medicine

## 2020-04-26 DIAGNOSIS — G9340 Encephalopathy, unspecified: Secondary | ICD-10-CM

## 2020-04-26 DIAGNOSIS — K56609 Unspecified intestinal obstruction, unspecified as to partial versus complete obstruction: Secondary | ICD-10-CM | POA: Diagnosis not present

## 2020-04-26 DIAGNOSIS — E43 Unspecified severe protein-calorie malnutrition: Secondary | ICD-10-CM | POA: Diagnosis not present

## 2020-04-26 DIAGNOSIS — G8220 Paraplegia, unspecified: Secondary | ICD-10-CM | POA: Diagnosis not present

## 2020-04-26 LAB — CBC WITH DIFFERENTIAL/PLATELET
Abs Immature Granulocytes: 0.24 10*3/uL — ABNORMAL HIGH (ref 0.00–0.07)
Basophils Absolute: 0 10*3/uL (ref 0.0–0.1)
Basophils Relative: 0 %
Eosinophils Absolute: 0.1 10*3/uL (ref 0.0–0.5)
Eosinophils Relative: 1 %
HCT: 35.1 % — ABNORMAL LOW (ref 36.0–46.0)
Hemoglobin: 11 g/dL — ABNORMAL LOW (ref 12.0–15.0)
Immature Granulocytes: 2 %
Lymphocytes Relative: 9 %
Lymphs Abs: 1.2 10*3/uL (ref 0.7–4.0)
MCH: 28 pg (ref 26.0–34.0)
MCHC: 31.3 g/dL (ref 30.0–36.0)
MCV: 89.3 fL (ref 80.0–100.0)
Monocytes Absolute: 0.6 10*3/uL (ref 0.1–1.0)
Monocytes Relative: 5 %
Neutro Abs: 11.1 10*3/uL — ABNORMAL HIGH (ref 1.7–7.7)
Neutrophils Relative %: 83 %
Platelets: 317 10*3/uL (ref 150–400)
RBC: 3.93 MIL/uL (ref 3.87–5.11)
RDW: 16.8 % — ABNORMAL HIGH (ref 11.5–15.5)
WBC: 13.2 10*3/uL — ABNORMAL HIGH (ref 4.0–10.5)
nRBC: 0 % (ref 0.0–0.2)

## 2020-04-26 LAB — COMPREHENSIVE METABOLIC PANEL
ALT: 6 U/L (ref 0–44)
AST: 11 U/L — ABNORMAL LOW (ref 15–41)
Albumin: 2.2 g/dL — ABNORMAL LOW (ref 3.5–5.0)
Alkaline Phosphatase: 42 U/L (ref 38–126)
Anion gap: 6 (ref 5–15)
BUN: 16 mg/dL (ref 6–20)
CO2: 29 mmol/L (ref 22–32)
Calcium: 7.7 mg/dL — ABNORMAL LOW (ref 8.9–10.3)
Chloride: 110 mmol/L (ref 98–111)
Creatinine, Ser: <0.3 mg/dL — ABNORMAL LOW (ref 0.44–1.00)
Glucose, Bld: 170 mg/dL — ABNORMAL HIGH (ref 70–99)
Potassium: 4.2 mmol/L (ref 3.5–5.1)
Sodium: 145 mmol/L (ref 135–145)
Total Bilirubin: 0.2 mg/dL — ABNORMAL LOW (ref 0.3–1.2)
Total Protein: 5.6 g/dL — ABNORMAL LOW (ref 6.5–8.1)

## 2020-04-26 LAB — GLUCOSE, CAPILLARY
Glucose-Capillary: 174 mg/dL — ABNORMAL HIGH (ref 70–99)
Glucose-Capillary: 166 mg/dL — ABNORMAL HIGH (ref 70–99)
Glucose-Capillary: 216 mg/dL — ABNORMAL HIGH (ref 70–99)
Glucose-Capillary: 137 mg/dL — ABNORMAL HIGH (ref 70–99)
Glucose-Capillary: 134 mg/dL — ABNORMAL HIGH (ref 70–99)

## 2020-04-26 LAB — PREALBUMIN: Prealbumin: 9.2 mg/dL — ABNORMAL LOW (ref 18–38)

## 2020-04-26 LAB — PHOSPHORUS: Phosphorus: 2.9 mg/dL (ref 2.5–4.6)

## 2020-04-26 LAB — TRIGLYCERIDES: Triglycerides: 36 mg/dL (ref <150)

## 2020-04-26 LAB — MAGNESIUM: Magnesium: 1.7 mg/dL (ref 1.7–2.4)

## 2020-04-26 MED ORDER — MAGNESIUM SULFATE 2 GM/50ML IV SOLN
2.0000 g | Freq: Once | INTRAVENOUS | Status: AC
  Administered 2020-04-26: 2 g via INTRAVENOUS
  Filled 2020-04-26: qty 50

## 2020-04-26 MED ORDER — SODIUM CHLORIDE 0.9 % IV BOLUS
250.0000 mL | Freq: Once | INTRAVENOUS | Status: AC
  Administered 2020-04-26: 250 mL via INTRAVENOUS

## 2020-04-26 MED ORDER — ELDERTONIC PO LIQD
15.0000 mL | Freq: Every day | ORAL | Status: DC
  Filled 2020-04-26: qty 15

## 2020-04-26 MED ORDER — TRAVASOL 10 % IV SOLN
INTRAVENOUS | Status: DC
  Filled 2020-04-26: qty 187.2

## 2020-04-26 MED ORDER — ADULT MULTIVITAMIN W/MINERALS CH: 1.0000 | ORAL_TABLET | Freq: Every day | ORAL | Status: DC

## 2020-04-26 MED ORDER — SODIUM CHLORIDE 0.9 % IV SOLN: INTRAVENOUS | Status: DC

## 2020-04-26 NOTE — Consult Note (Addendum)
PHARMACY - TOTAL PARENTERAL NUTRITION CONSULT NOTE   Indication: bowel obstruction  Patient Measurements: Height: 5\' 1"  (154.9 cm) Weight: 35.1 kg (77 lb 4.8 oz) IBW/kg (Calculated) : 47.8   Body mass index is 14.61 kg/m.  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 Per notes, patient is at goal rate of enteral feeds (40 mL/hr).   Glucose / Insulin: BG 134 - 179, 3 units SSI required Electrolytes: K 4.2 (WNL), Na 145 (WNL), Corrected Calcium 9.14, Phosphorus 2.9 (WNL), Mg 1.7 (slightly hypomagnesemia)  Renal:  SCr<1, stable LFTs / TGs: AST 11 (slightly low), ALT 6 (WNL). Prealbumin / albumin: 5.9 mg/dL / 3.5 > 2.4 > 2.2 g/dL Intake / Output; MIVF:  10/21 0701 - 10/22 0700 In: 2591 [I.V.:1679.6; IV Piggyback:911.4] Out: 2150 [Urine:2100; Emesis/NG output:50]  GI Imaging: 10/21: decreased caliber of small bowel loops 10/22: partial small bowel obstruction with passage of contrast into a decompressed colon and rectum  Surgeries / Procedures: none since TPN began  Central access: 04/21/20  TPN start date: 04/21/20   Nutritional Goals (per RD recommendation on 04/21/20): kCal: 1200 - 1400 kCal/day, Protein: 60 - 70 g/day, Fluid: 1000 - 1200 mL/day  Current Nutrition:  TPN @ 30 mL/hr and Osmolite 1.5 CAL @ 40 mL/hr.   Plan:   D/w 04/23/20 and Lynden Oxford, TPN will be stopped.   Will order Mg IV 2 g x1 dose for now  D/w Dr. Alesia Banda about d/c sensitive q6h SSI and CBC w/diff  Continue to monitor electrolytes as per consult.   Allena Katz, PharmD 04/26/2020,7:49 AM

## 2020-04-26 NOTE — Consult Note (Signed)
[facility] Nurse ostomy consult note Requested to see patient with traveler nurse to educate on application of new ostomy pouch. Patient is completely dependent in the care of her stoma but can give directions if needed.  Stoma type/location: LLQ, ileal conduit  Stomal assessment/size: 1 5/8" round budded, pink and moist Peristomal assessment: intact Treatment options for stomal/peristomal skin: none; patient using 2" barrier ring at home; no supplies in the room; did not order for pouch change today Output yellow urine with mucous Ostomy pouching: 1pc flat connected to BSD.  Education provided:  Met with traveler; explained pouch change procedure and hooking to night time drainage. Patient does not need any additional education however her CG had been cutting the opening much wider than the stoma.  Explained rationale for cutting to fit the stoma; ok to continue use of barrier ring. She requested powder when I explained rationale for powder use she did not need at this time. Her peristomal skin is in good condition.  Discussed POC with patient and bedside nurse.  Re consult if needed, will not follow at this time. Thanks  Depaul Arizpe [employer], RN,CWOCN, CNS, [place] [phone])

## 2020-04-26 NOTE — Consult Note (Signed)
Consultation  Referring Provider:   Enedina Finner Admit date: 05/02/2020 Consult date: 10/25 Reason for Consultation:     PEG tube placement         HPI:   [name] is a 54 y.o. female lady who is a quadriplegic and has history of ileal conduit who has been admitted to hospital for SBO thought to be due to adhesions. She is now tolerating tube feeds without issue. We were consulted for potential PEG placement for nutritional supplementation. Patient states she just doesn't have an appetite. She has a home health aid at home who helps with feedings but doesn't really have much of an appetite. She does enjoy strawberry ensures.   Past Medical History:  Diagnosis Date  . COPD (chronic obstructive pulmonary disease) (HCC)   . History of urostomy   . Paraplegic spinal paralysis [facility])     History reviewed. No pertinent surgical history.  History of ileal conduit  History reviewed. No pertinent family history.   Social History   Tobacco Use  . Smoking status: Never Smoker  . Smokeless tobacco: Never Used  Substance Use Topics  . Alcohol use: No  . Drug use: Not on file    Prior to Admission medications   Medication Sig Start Date End Date Taking? Authorizing Provider  buPROPion (WELLBUTRIN XL) 150 MG 24 hr tablet Take 150 mg by mouth daily.   Yes [provider]  Albuterol Sulfate 108 (90 Base) MCG/ACT AEPB Inhale 1 puff into the lungs every 4 (four) hours as needed.    [provider]  Ascorbic Acid (VITAMIN C) 500 MG CHEW Chew 500 mg by mouth daily. 12/31/17   [provider]  baclofen (LIORESAL) 10 MG tablet Take 1 tablet (10 mg total) by mouth 3 (three) times daily. Patient taking differently: Take 10 mg by mouth 3 (three) times daily as needed for muscle spasms.  10/15/18   Altamese Dilling, MD  beclomethasone (QVAR) 40 MCG/ACT inhaler Inhale 2 puffs into the lungs 2 (two) times daily.    [provider]  feeding supplement, ENSURE  ENLIVE, (ENSURE ENLIVE) LIQD Take 237 mLs by mouth 3 (three) times daily between meals. 09/09/16   Enedina Finner, MD  fluticasone (FLONASE) 50 MCG/ACT nasal spray Place 2 sprays into both nostrils daily as needed.  08/27/15   [provider]  gabapentin (NEURONTIN) 100 MG capsule Take 1 capsule (100 mg total) by mouth 3 (three) times daily. Patient taking differently: Take 100 mg by mouth 3 (three) times daily as needed.  10/15/18   Altamese Dilling, MD  guaiFENesin (MUCINEX) 600 MG 12 hr tablet Take 1 tablet (600 mg total) by mouth 2 (two) times daily. Patient not taking: Reported on 04/19/2020 10/15/18   Altamese Dilling, MD  metoprolol tartrate (LOPRESSOR) 25 MG tablet Take 0.5 tablets (12.5 mg total) by mouth 2 (two) times daily. Patient not taking: Reported on 04/19/2020 10/15/18   Altamese Dilling, MD  mirtazapine (REMERON) 30 MG tablet Take 30 mg by mouth at bedtime. 05/24/16   [provider]  oxyCODONE-acetaminophen (PERCOCET) 10-325 MG tablet Take 1 tablet by mouth 3 (three) times daily. 09/03/18   [provider]  tiotropium (SPIRIVA HANDIHALER) 18 MCG inhalation capsule Place 18 mcg into inhaler and inhale daily. 08/18/16   [provider]    Current Facility-Administered Medications  Medication Dose Route Frequency Provider Last Rate Last Admin  . 0.9 %  sodium chloride infusion   Intravenous PRN Enedina Finner, MD 10  mL/hr at 04/26/20 0546 250 mL at 04/26/20 0546  . acetaminophen (TYLENOL) tablet 650 mg  650 mg Oral Q6H PRN Lynn Ito, MD   650 mg at 04/26/20 1027  . alum & mag hydroxide-simeth (MAALOX/MYLANTA) 200-200-20 MG/5ML suspension 15 mL  15 mL Oral Q6H PRN Lynn Ito, MD   15 mL at 04/26/20 1026  . ampicillin (OMNIPEN) 2 g in sodium chloride 0.9 % 100 mL IVPB  2 g Intravenous Q6H Enedina Finner, MD 300 mL/hr at 04/26/20 1135 2 g at 04/26/20 1135  . cefTRIAXone (ROCEPHIN) 2 g in sodium chloride 0.9 % 100 mL IVPB  2 g Intravenous Q12H  Enedina Finner, MD 200 mL/hr at 04/26/20 0958 2 g at 04/26/20 0958  . Chlorhexidine Gluconate Cloth 2 % PADS 6 each  6 each Topical Daily Enedina Finner, MD   6 each at 04/26/20 1028  . enoxaparin (LOVENOX) injection 40 mg  40 mg Subcutaneous Q24H Agbata, Tochukwu, MD   40 mg at 04/25/20 2231  . feeding supplement (BOOST / RESOURCE BREEZE) liquid 1 Container  1 Container Oral TID BM Duanne Guess, MD   1 Container at 04/25/20 1349  . feeding supplement (OSMOLITE 1.5 CAL) liquid 1,000 mL  1,000 mL Per Tube Continuous Enedina Finner, MD 40 mL/hr at 04/26/20 1429 1,000 mL at 04/26/20 1429  . fluticasone (FLONASE) 50 MCG/ACT nasal spray 2 spray  2 spray Each Nare Daily Agbata, Tochukwu, MD   2 spray at 04/26/20 0756  . fluticasone (FLOVENT HFA) 44 MCG/ACT inhaler 2 puff  2 puff Inhalation BID Katha Cabal, RPH   2 puff at 04/26/20 0756  . [START ON 04/12/2020] geriatric multivitamins-minerals (ELDERTONIC/GEVRABON) liquid 15 mL  15 mL Per Tube Daily Enedina Finner, MD      . influenza vac split quadrivalent PF (FLUARIX) injection 0.5 mL  0.5 mL Intramuscular Tomorrow-1000 Agbata, Tochukwu, MD      . mirtazapine (REMERON SOL-TAB) disintegrating tablet 30 mg  30 mg Oral QHS Manuela Schwartz, NP   30 mg at 04/25/20 2231  . ondansetron (ZOFRAN) tablet 4 mg  4 mg Oral Q6H PRN Agbata, Tochukwu, MD       Or  . ondansetron (ZOFRAN) injection 4 mg  4 mg Intravenous Q6H PRN Agbata, Tochukwu, MD   4 mg at 04/25/20 1654  . pantoprazole sodium (PROTONIX) 40 mg/20 mL oral suspension 20 mg  20 mg Per Tube Daily Tressie Ellis, RPH   20 mg at 04/26/20 0801  . phenol (CHLORASEPTIC) mouth spray 1 spray  1 spray Mouth/Throat PRN Agbata, Tochukwu, MD      . sodium chloride flush (NS) 0.9 % injection 10-40 mL  10-40 mL Intracatheter PRN Enedina Finner, MD      . tiotropium [facility]) inhalation capsule ([facility] use ONLY) 18 mcg  18 mcg Inhalation Daily Agbata, Tochukwu, MD   18 mcg at 04/26/20 0754  . TPN ADULT (ION)   Intravenous  Continuous TPN Ronnald Ramp, [facility]   Stopped at 04/26/20 1522    Allergies as of 15-May-2020 - Review Complete May 15, 2020  Allergen Reaction Noted  . Shellfish allergy Shortness Of Breath 03/29/2015     Review of Systems:    All systems reviewed and negative except where noted in HPI.  Review of Systems  Constitutional: Positive for malaise/fatigue and weight loss. Negative for chills and fever.  Cardiovascular: Negative for chest pain.  Gastrointestinal: Positive for abdominal pain.  Psychiatric/Behavioral: Negative for depression.  All other systems reviewed and are  negative.     Physical Exam:  Vital signs in last 24 hours: Temp:  [97.8 F (36.6 C)-99.5 F (37.5 C)] 98.7 F (37.1 C) (10/25 1152) Pulse Rate:  [93-123] 110 (10/25 1248) Resp:  [14-20] 16 (10/25 1152) BP: (71-117)/(48-84) 97/67 (10/25 1248) SpO2:  [90 %-100 %] 99 % (10/25 1152) Weight:  [35.1 kg] 35.1 kg (10/25 0421) Last BM Date: 04/26/20 General:   Appears in no acute distress but has flat affect Head:  Normocephalic and atraumatic. Eyes:   No icterus. Mouth: dry mucous membranes Neck:  Supple; no masses felt Lungs:  No respiratory distress Heart: RRR Abdomen:   Distended, ostomy in left upper quadrant Msk:  Contracted upper extremities Neurologic:  Alert and  oriented x4 Psych:  Flat affect  LAB RESULTS: Recent Labs    04/26/20 0543  WBC 13.2*  HGB 11.0*  HCT 35.1*  PLT 317   BMET Recent Labs    04/24/20 0500 04/26/20 0543  NA 145 145  K 3.4* 4.2  CL 107 110  CO2 32 29  GLUCOSE 112* 170*  BUN 7 16  CREATININE <0.30* <0.30*  CALCIUM 7.5* 7.7*   LFT Recent Labs    04/26/20 0543  PROT 5.6*  ALBUMIN 2.2*  AST 11*  ALT 6  ALKPHOS 42  BILITOT 0.2*   PT/INR No results for input(s): LABPROT, INR in the last 72 hours.  STUDIES: DG Abd 1 View  Result Date: 04/25/2020 CLINICAL DATA:  Abdominal pain, vomiting and weakness. EXAM: ABDOMEN - 1 VIEW COMPARISON:  04/24/2020  radiograph and prior studies FINDINGS: An NG tube is noted within the proximal-mid stomach. Mild gastric distention is noted. Gas within the colon is noted. No dilated small bowel loops are present. IVC filter, spinal cord stimulator and IUD again noted. No other changes noted. IMPRESSION: NG tube within the proximal-mid stomach with mild gastric distention. Electronically Signed   By: Harmon Pier M.D.   On: 04/25/2020 18:40       Impression / Plan:   54 y/o lady who is a quadriplegic s/p ileal conduit for which we were consulted for PEG placement. Unfortunately, ileal conduit location gives endoscopically placed PEG placement a narrow window for placement. Have suggested to primary team to discuss with IR for potential IR placed PEG.  Please call with any questions or concerns.  Merlyn Lot MD, MPH [facility]

## 2020-04-26 NOTE — Treatment Plan (Signed)
Urostomy pouch changed at this time with wound care nurse.

## 2020-04-26 NOTE — Progress Notes (Signed)
04/26/2020  Subjective: No acute events.  Patient more awake and conversational compared to last Friday when I saw her.  She's tolerating tube feeds through NG tube and is having bowel function.  Her KUB has some gastric distention, but she feels well.  We talked about what's happened with her SBO, her nutrition issues, and the possibility of PEG tube.  Vital signs: Temp:  [97.8 F (36.6 C)-99.5 F (37.5 C)] 98.7 F (37.1 C) (10/25 1152) Pulse Rate:  [93-123] 108 (10/25 1152) Resp:  [14-20] 16 (10/25 1152) BP: (71-117)/(48-84) 95/61 (10/25 1152) SpO2:  [90 %-100 %] 99 % (10/25 1152) Weight:  [35.1 kg] 35.1 kg (10/25 0421)   Intake/Output: 10/24 0701 - 10/25 0700 In: 1479.6 [I.V.:637.8; NG/GT:330; IV Piggyback:511.8] Out: 500 [Urine:500] Last BM Date: 04/26/20  Physical Exam: Constitutional:  No acute distress Abdomen:  Soft, mildly distended, nontender to palpation.  RLQ baclofen pump, LLQ urostomy.  Labs:  Recent Labs    04/26/20 0543  WBC 13.2*  HGB 11.0*  HCT 35.1*  PLT 317   Recent Labs    04/24/20 0500 04/26/20 0543  NA 145 145  K 3.4* 4.2  CL 107 110  CO2 32 29  GLUCOSE 112* 170*  BUN 7 16  CREATININE <0.30* <0.30*  CALCIUM 7.5* 7.7*   No results for input(s): LABPROT, INR in the last 72 hours.  Imaging: DG Abd 1 View  Result Date: 04/25/2020 CLINICAL DATA:  Abdominal pain, vomiting and weakness. EXAM: ABDOMEN - 1 VIEW COMPARISON:  04/24/2020 radiograph and prior studies FINDINGS: An NG tube is noted within the proximal-mid stomach. Mild gastric distention is noted. Gas within the colon is noted. No dilated small bowel loops are present. IVC filter, spinal cord stimulator and IUD again noted. No other changes noted. IMPRESSION: NG tube within the proximal-mid stomach with mild gastric distention. Electronically Signed   By: Harmon Pier M.D.   On: 04/25/2020 18:40    Assessment/Plan: This is a 54 y.o. female with resolved SBO and altered mental  status.  --Discussed with the patient the importance of keeping good nutrition.  Good nutrition can help with healing her decubitus wound, improve strength.  Discussed that with a PEG tube, she would still be able to eat and use the PEG as a supplement to keep her well nourished.  If she reaches nutrition needs with just PO intake, PEG tube would not be needed in the future. --Patient tolerating tube feeds well and she is currently at goal.  OK to stop TPN today and continue clear liquids per SLP. --Appreciate GI's input/eval for PEG tube placement.   --No surgical intervention needed at this point, will continue to follow.   Howie Ill, MD  [facility]

## 2020-04-26 NOTE — Progress Notes (Signed)
Patient ID: [name], female   DOB: [birth year], 54 y.o.   MRN: [id number]  Spoke with Dr schnier regarding IVC filter strut likely to penetrate the duodenum. They will attempt to remove if possible. Vascular surgery consult placed.

## 2020-04-26 NOTE — Progress Notes (Signed)
[facility]  - [facility] at [facility]   PATIENT NAME: [name]    MR#:  [id number]  DATE OF BIRTH:  02-24-1966  SUBJECTIVE:  patient more awake.  No family at bedside. Patient is communicative today. S tolerating clear liquid diet. She is having some abdominal pain. Once her narcotics.  I discussed at length with patient regarding peg tube placement she is agreeable at present.  REVIEW OF SYSTEMS:   Review of Systems  Constitutional: Negative for chills, fever and weight loss.  HENT: Negative for ear discharge, ear pain and nosebleeds.   Eyes: Negative for blurred vision, pain and discharge.  Respiratory: Negative for sputum production, shortness of breath, wheezing and stridor.   Cardiovascular: Negative for chest pain, palpitations, orthopnea and PND.  Gastrointestinal: Negative for abdominal pain, diarrhea, nausea and vomiting.  Genitourinary: Negative for frequency and urgency.  Musculoskeletal: Negative for back pain and joint pain.  Neurological: Positive for weakness. Negative for sensory change, speech change and focal weakness.  Psychiatric/Behavioral: Negative for depression and hallucinations. The patient is not nervous/anxious.    Tolerating Diet:cld Tolerating PT: bedbound due to functional quadriplegia  DRUG ALLERGIES:   Allergies  Allergen Reactions  . Shellfish Allergy Shortness Of Breath    VITALS:  Blood pressure 95/61, pulse (!) 108, temperature 98.7 F (37.1 C), resp. rate 16, height 5\' 1"  (1.549 m), weight 35.1 kg, SpO2 99 %.  PHYSICAL EXAMINATION:   Physical Exam   GENERAL:  54 y.o.-year-old patient lying in the bed with no acute distress. Thin cachectic severely malnourished HEENT: NG+ old trach sit well healed \\LUNGS : Normal breath sounds bilaterally, no wheezing, rales, rhonchi. No use of accessory muscles of respiration. No respiratory distress CARDIOVASCULAR: S1, S2 normal. No murmurs, rubs, or gallops.  ABDOMEN: Soft,  nontender, nondistended. urostomy bag +  EXTREMITIES: thin atrophied with contractors PICC + NEUROLOGIC: functional quadriplegia with significant contracture both upper and lower extremity PSYCHIATRIC:  patient is alert oriented times three SKIN:   Pressure Injury 05/17/2020 Sacrum Stage 3 -  Full thickness tissue loss. Subcutaneous fat may be visible but bone, tendon or muscle are NOT exposed. (Active)  17-May-2020 1433  Location: Sacrum  Location Orientation:   Staging: Stage 3 -  Full thickness tissue loss. Subcutaneous fat may be visible but bone, tendon or muscle are NOT exposed.  Wound Description (Comments):   Present on Admission: Yes     Pressure Injury May 17, 2020 Back Lateral;Left;Upper Stage 2 -  Partial thickness loss of dermis presenting as a shallow open injury with a red, pink wound bed without slough. 1.75cm x 2cm (Active)  May 17, 2020 1923  Location: Back  Location Orientation: Lateral;Left;Upper  Staging: Stage 2 -  Partial thickness loss of dermis presenting as a shallow open injury with a red, pink wound bed without slough.  Wound Description (Comments): 1.75cm x 2cm  Present on Admission: Yes     Pressure Injury 04/10/2020 Heel Left;Right  (Active)  04/12/2020 2000  Location: Heel  Location Orientation: Left;Right  Staging: -- (recently healed pressure injury)  Wound Description (Comments):   Present on Admission: Yes   LABORATORY PANEL:  CBC Recent Labs  Lab 04/26/20 0543  WBC 13.2*  HGB 11.0*  HCT 35.1*  PLT 317    Chemistries  Recent Labs  Lab 04/26/20 0543  NA 145  K 4.2  CL 110  CO2 29  GLUCOSE 170*  BUN 16  CREATININE <0.30*  CALCIUM 7.7*  MG 1.7  AST 11*  ALT  6  ALKPHOS 42  BILITOT 0.2*   Cardiac Enzymes No results for input(s): TROPONINI in the last 168 hours. RADIOLOGY:  DG Abd 1 View  Result Date: 04/25/2020 CLINICAL DATA:  Abdominal pain, vomiting and weakness. EXAM: ABDOMEN - 1 VIEW COMPARISON:  04/24/2020 radiograph and prior  studies FINDINGS: An NG tube is noted within the proximal-mid stomach. Mild gastric distention is noted. Gas within the colon is noted. No dilated small bowel loops are present. IVC filter, spinal cord stimulator and IUD again noted. No other changes noted. IMPRESSION: NG tube within the proximal-mid stomach with mild gastric distention. Electronically Signed   By: Harmon Pier M.D.   On: 04/25/2020 18:40   ASSESSMENT AND PLAN:   DENNY LAVE is a 54 y.o. 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  She also complains of diffuse abdominal pain.  Acute metabolic encephalopathy suspected due to severe hypernatremia/hypokalemia/hyperchloremia/hypomagnesimia/hypophosphatemia Suspected bacterial meningitis mentation back to normal. -came in with serum sodium 136-- 157-- rechecked 159-- dextrose drip-- 151--148--145 -pharmacy for electrolyte replacement -patient is more awake and alert. Tatian back to baseline. -CT head no acute finding -neurology consultation with Dr. Otelia Limes-- recommends empiric antibiotic for bacterial meningitis for total 14 days.  -IV ampicillin and Rocephin per pharmacy x 14 days  Small bowel obstruction -improving clinically -overall x-ray seems to be improving per Dr. Aleen Campi. Holding of surgery at present -appreciate surgical input.  --CT abdomen  shows high-grade small bowel obstruction as well as fecal impaction -Supportive care with pain control, antiemetics, IV PPI and IV fluid hydration -10/23-- discussed with Dr. Lady Gary-- start NG tube feeding, wean TPN off over the weekend -speech therapy recommends clear liquid. -10/25-- x-ray KUB overall looks better. Some gaseous distention. Patient having bowel movement.  Nutrition Status: severe malnutrition with failure to thrive, poor PO intake Refeeding  syndrome with electrolyte abnormality Nutrition Problem: Severe Malnutrition Etiology: chronic illness (COPD, quadriplegia) Signs/Symptoms: percent weight loss, severe fat depletion, severe muscle depletion Percent weight loss: 14 % Interventions: TPN  -patient will benefit from peg tube placement. Surgery has discussed with patient's son who is in agreement.-- Plans on hold at present given altered mental status - 04/21/20--started IV TPN -04/24/2020-- started NG tube feeding, wean TPN, started clear liquid, patient refusing peg tube placement -10/25-- discussed again with patient regarding peg tube placement she is in agreement. Discussed with son Everlean Alstrom about the decision he is aware of it. -G.I. consult placed for Dr. Mia Creek for peg tube placement -TPN to be stopped today. 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  Patient has multiple comorbidities. Appreciate input  DVT prophylaxis: Lovenox Code Status: Full code Family Communication: spoke with  son Yazmina Pareja on the phone today 10/25 Disposition Plan:  to be determined-- TOC consult placed Consults called: Surgery, palliative care, Neurology  Status is: Inpatient  Patient continues to improve from encephalopathy standpoint. She still has NG tube feeding. G.I. consult place for possible peg tube placement evaluation. Currently on empiric IV antibiotics for possible bacterial meningitis. TOC for discharge planning.  Dispo: The patient is from: Home              Anticipated d/c is to: To be determined              Anticipated d/c date is: > 3 days              Patient currently is not medically stable to d/c.  TOTAL TIME TAKING CARE OF THIS PATIENT: 25 minutes.  >50% time spent on counselling and coordination of care  Note: This dictation was prepared with Dragon dictation along with smaller phrase technology. Any transcriptional errors that result from this process are unintentional.  Enedina Finner M.D    [facility]   CC: Primary care physician; [place], Julie A, MDPatient ID: [name], female   DOB: 11-Jul-1965, 54 y.o.   MRN: [id number]

## 2020-04-26 NOTE — Progress Notes (Signed)
SLP Cancellation Note  Patient Details Name: [name] MRN: [id number] DOB: 03/14/1966   Cancelled treatment:       Reason Eval/Treat Not Completed: Medical issues which prohibited therapy (chart reviewed; consulted MD). Per MD, pt is NPO post abdominal pain yesterday PM; MD is also consulting GI for possible PEG placement d/t overall medical/GI status. ST services will continue to f/u for any further education on general aspiration precautions; needs while admitted.      Jerilynn Som, MS, CCC-SLP Speech Language Pathologist Rehab Services [phone] [facility] 04/26/2020, 12:13 PM

## 2020-04-26 NOTE — Clinical Social Work Note (Signed)
CSW acknowledges consults for SNF/Home Health. CSW following progress and will assist with disposition planning when appropriate.  Charlynn Court, CSW [phone]

## 2020-04-26 NOTE — Progress Notes (Signed)
   04/26/20 1139  Vitals  Temp 98.7 F (37.1 C)  BP (!) 81/58  MAP (mmHg) 66  BP Location Left Arm  BP Method Automatic  Pulse Rate (!) 113  Pulse Rate Source Monitor  Resp 14  MEWS COLOR  MEWS Score Color Yellow  Oxygen Therapy  SpO2 99 %  O2 Device Room Air  MEWS Score  MEWS Temp 0  MEWS Systolic 1  MEWS Pulse 2  MEWS RR 0  MEWS LOC 0  MEWS Score 3  MD Patel notified of these vital signs. Awaiting orders at this time.

## 2020-04-26 NOTE — Progress Notes (Signed)
Nutrition Follow-up  DOCUMENTATION CODES:   Severe malnutrition in context of chronic illness  INTERVENTION:  Plan is to discontinue TPN today as patient is tolerating goal TF regimen.  Continue goal TF regimen:  -Osmolite 1.5 Cal at 40 mL/hr 960 mL goal daily volume -Provides 1440 kcal, 60 grams of protein, 731 mL H2O daily -Continue minimum free water flush of 30 mL Q4hrs to maintain tube patency (provides a total of 911 mL H2O daily)  Provide MVI daily per tube.  Continue Boost Breeze po TID, each supplement provides 250 kcal and 9 grams of protein.   NUTRITION DIAGNOSIS:   Severe Malnutrition related to chronic illness (COPD, quadriplegia) as evidenced by percent weight loss, severe fat depletion, severe muscle depletion.  Ongoing.  GOAL:   Patient will meet greater than or equal to 90% of their needs  Met with TF regimen.  MONITOR:   Diet advancement, Labs, Weight trends, Skin, I & O's, Other (Comment) (TPN)  REASON FOR ASSESSMENT:   Consult (verbal: Dr. Posey Pronto) Enteral/tube feeding initiation and management  ASSESSMENT:   54 y.o. 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  RD noted calorie count was ordered yesterday. Patient was on clear liquid diet, then made NPO, and is now back on clear liquid diet as of this morning. Discussed with Surgical PA via secure chat. Plan is to discontinue calorie count for now until patient is on a solid-texture diet as clear liquid diet is very low in calories/protein and will not be representative of true intake.   Met with patient at bedside. She is now on her goal TF regimen via NGT. She reports she is tolerating tube feeds. Patient reports plan is for placement of G-tube. Patient is on clear liquid diet but reports she cannot take much PO by mouth. Boost Breeze po TID was ordered for patient but she reports she  has not been able to try yet.  Enteral Access: 16 Fr. NGT placed 10/17; terminates in proximal-mid stomach per abdominal x-ray 10/24  TF regimen: Osmolite 1.5 Cal at 40 mL/hr  IV Access: right brachial double lumen PICC placed 10/20  TPN regimen: Travasol 10% 52 grams/L, dextrose 15.3%, SMOFlipid 31.3 grams/L at 30 mL/hr (provides 750.3 kcal, 37.4 grams of protein); TPN had been reduced to half rate; pt also receiving thiamine 100 mg daily in TPN  Medications reviewed and include: Novolog 0-6 units Q4hrs, Remeron 30 mg QHS, Protonix, ampicillin, ceftriaxone.  Labs reviewed: CBG 166-216, Creatinine < 0.3.  I/O: 500 mL UOP yesterday (0.6 mL/kg/hr)  Weight trend: 35.1 kg on 10/25; +1.5 kg from 10/23  Discussed with RN. Discussed with Surgeon, Surgery PA, and Pharmacy via secure chat. Plan is to discontinue TPN today.  Diet Order:   Diet Order            Diet clear liquid Room service appropriate? Yes; Fluid consistency: Thin  Diet effective now                EDUCATION NEEDS:   Not appropriate for education at this time  Skin:  Skin Assessment: Reviewed RN Assessment (Left heel: 0.1cm x 0.2cm x 0.1cm, Right heel: healed, Right IT: Stage 3 with 2cm x 3.5cm x 0.1cm pink wound in the center of a 5cm x 4cm previous wound (now scar tissue); undermining measuring 0.6cm at distal margin, Sacrum: 6.5cm x 15cm x 0.3cm)  Last BM:  04/26/2020  -  smear type 6 (had small and medium type 6 on 10/24)  Height:   Ht Readings from Last 1 Encounters:  04/21/20 _0  (1.549 m)   Weight:   Wt Readings from Last 1 Encounters:  04/26/20 35.1 kg   Ideal Body Weight:  38 kg (adjusted for quadraplegia)  BMI:  Body mass index is 14.61 kg/m.  Estimated Nutritional Needs:   Kcal:  1200-1400kcal/day  Protein:  60-70g/day  Fluid:  1-1.2L/day  Jacklynn Barnacle, MS, RD, LDN Pager number available on Amion

## 2020-04-26 NOTE — Progress Notes (Signed)
PT Cancellation Note  Patient Details Name: [name] MRN: [id number] DOB: 01/07/1966   Cancelled Treatment:     PT consult orders received. Chart reviewed. Attempted to see pt for PT evaluation but pt off floor for imaging. Will f/u with pt & attempt to see pt as able.   Aleda Grana, PT, DPT 04/26/20, 3:00 PM    [name] 04/26/2020, 3:00 PM

## 2020-04-27 ENCOUNTER — Encounter: Admission: EM | Disposition: E | Payer: Medicare Other | Source: Home / Self Care | Attending: Internal Medicine

## 2020-04-27 ENCOUNTER — Other Ambulatory Visit (INDEPENDENT_AMBULATORY_CARE_PROVIDER_SITE_OTHER): Payer: Self-pay | Admitting: Vascular Surgery

## 2020-04-27 DIAGNOSIS — I82409 Acute embolism and thrombosis of unspecified deep veins of unspecified lower extremity: Secondary | ICD-10-CM

## 2020-04-27 DIAGNOSIS — R140 Abdominal distension (gaseous): Secondary | ICD-10-CM | POA: Diagnosis not present

## 2020-04-27 DIAGNOSIS — E43 Unspecified severe protein-calorie malnutrition: Secondary | ICD-10-CM | POA: Diagnosis not present

## 2020-04-27 DIAGNOSIS — E46 Unspecified protein-calorie malnutrition: Secondary | ICD-10-CM

## 2020-04-27 DIAGNOSIS — K56609 Unspecified intestinal obstruction, unspecified as to partial versus complete obstruction: Secondary | ICD-10-CM | POA: Diagnosis not present

## 2020-04-27 DIAGNOSIS — I2699 Other pulmonary embolism without acute cor pulmonale: Secondary | ICD-10-CM

## 2020-04-27 DIAGNOSIS — G8220 Paraplegia, unspecified: Secondary | ICD-10-CM | POA: Diagnosis not present

## 2020-04-27 HISTORY — PX: IVC FILTER REMOVAL: CATH118246

## 2020-04-27 LAB — BASIC METABOLIC PANEL
Anion gap: 7 (ref 5–15)
BUN: 16 mg/dL (ref 6–20)
CO2: 27 mmol/L (ref 22–32)
Calcium: 7.5 mg/dL — ABNORMAL LOW (ref 8.9–10.3)
Chloride: 108 mmol/L (ref 98–111)
Creatinine, Ser: <0.3 mg/dL — ABNORMAL LOW (ref 0.44–1.00)
Glucose, Bld: 96 mg/dL (ref 70–99)
Potassium: 3.9 mmol/L (ref 3.5–5.1)
Sodium: 142 mmol/L (ref 135–145)

## 2020-04-27 LAB — GLUCOSE, CAPILLARY
Glucose-Capillary: 137 mg/dL — ABNORMAL HIGH (ref 70–99)
Glucose-Capillary: 151 mg/dL — ABNORMAL HIGH (ref 70–99)
Glucose-Capillary: 196 mg/dL — ABNORMAL HIGH (ref 70–99)
Glucose-Capillary: 82 mg/dL (ref 70–99)
Glucose-Capillary: 85 mg/dL (ref 70–99)
Glucose-Capillary: 87 mg/dL (ref 70–99)
Glucose-Capillary: 92 mg/dL (ref 70–99)

## 2020-04-27 LAB — MAGNESIUM: Magnesium: 1.7 mg/dL (ref 1.7–2.4)

## 2020-04-27 LAB — PHOSPHORUS: Phosphorus: 3 mg/dL (ref 2.5–4.6)

## 2020-04-27 SURGERY — IVC FILTER REMOVAL
Anesthesia: Moderate Sedation

## 2020-04-27 MED ORDER — SODIUM CHLORIDE 0.9 % IV SOLN: INTRAVENOUS | Status: DC

## 2020-04-27 MED ORDER — ALUM & MAG HYDROXIDE-SIMETH 200-200-20 MG/5ML PO SUSP: 15.0000 mL | Freq: Four times a day (QID) | ORAL | Status: DC | PRN

## 2020-04-27 MED ORDER — ADULT MULTIVITAMIN LIQUID CH
15.0000 mL | Freq: Every day | ORAL | Status: DC
  Administered 2020-04-27 – 2020-04-28 (×2): 15 mL
  Filled 2020-04-27 (×3): qty 15

## 2020-04-27 MED ORDER — IODIXANOL 320 MG/ML IV SOLN
INTRAVENOUS | Status: DC | PRN
  Administered 2020-04-27: 40 mL via INTRAVENOUS

## 2020-04-27 MED ORDER — LABETALOL HCL 5 MG/ML IV SOLN
INTRAVENOUS | Status: AC
  Filled 2020-04-27: qty 4

## 2020-04-27 MED ORDER — ONDANSETRON HCL 4 MG/2ML IJ SOLN
4.0000 mg | Freq: Four times a day (QID) | INTRAMUSCULAR | Status: DC | PRN
  Filled 2020-04-27: qty 2

## 2020-04-27 MED ORDER — FENTANYL CITRATE (PF) 100 MCG/2ML IJ SOLN
INTRAMUSCULAR | Status: DC | PRN
  Administered 2020-04-27: 25 ug via INTRAVENOUS
  Administered 2020-04-27: 50 ug via INTRAVENOUS

## 2020-04-27 MED ORDER — HEPARIN SODIUM (PORCINE) 1000 UNIT/ML IJ SOLN
INTRAMUSCULAR | Status: AC
  Filled 2020-04-27: qty 1

## 2020-04-27 MED ORDER — PHENYLEPHRINE HCL (PRESSORS) 10 MG/ML IV SOLN
INTRAVENOUS | Status: AC
  Filled 2020-04-27: qty 1

## 2020-04-27 MED ORDER — ACETAMINOPHEN 325 MG PO TABS: 650.0000 mg | ORAL_TABLET | Freq: Four times a day (QID) | ORAL | Status: DC | PRN

## 2020-04-27 MED ORDER — HYDROMORPHONE HCL 1 MG/ML IJ SOLN: 1.0000 mg | Freq: Once | INTRAMUSCULAR | Status: DC | PRN

## 2020-04-27 MED ORDER — FAMOTIDINE 20 MG PO TABS
ORAL_TABLET | ORAL | Status: AC
  Administered 2020-04-27: 40 mg via ORAL
  Filled 2020-04-27: qty 1

## 2020-04-27 MED ORDER — ONDANSETRON HCL 4 MG PO TABS: 4.0000 mg | ORAL_TABLET | Freq: Four times a day (QID) | ORAL | Status: DC | PRN

## 2020-04-27 MED ORDER — LABETALOL HCL 5 MG/ML IV SOLN
INTRAVENOUS | Status: DC | PRN
  Administered 2020-04-27: 5 mg via INTRAVENOUS

## 2020-04-27 MED ORDER — METHYLPREDNISOLONE SODIUM SUCC 125 MG IJ SOLR
125.0000 mg | Freq: Once | INTRAMUSCULAR | Status: AC | PRN
  Administered 2020-04-27: 125 mg via INTRAVENOUS

## 2020-04-27 MED ORDER — PHENYLEPHRINE HCL-NACL 10-0.9 MG/250ML-% IV SOLN
0.0000 ug/min | INTRAVENOUS | Status: DC
  Administered 2020-04-27: 10 ug/min via INTRAVENOUS
  Filled 2020-04-27: qty 250

## 2020-04-27 MED ORDER — MIDAZOLAM HCL 2 MG/2ML IJ SOLN
INTRAMUSCULAR | Status: DC | PRN
  Administered 2020-04-27: 2 mg via INTRAVENOUS
  Administered 2020-04-27: 0.5 mg via INTRAVENOUS

## 2020-04-27 MED ORDER — FAMOTIDINE 20 MG PO TABS: 40.0000 mg | ORAL_TABLET | Freq: Once | ORAL | Status: AC | PRN

## 2020-04-27 MED ORDER — MIDAZOLAM HCL 5 MG/5ML IJ SOLN
INTRAMUSCULAR | Status: AC
  Filled 2020-04-27: qty 5

## 2020-04-27 MED ORDER — MIDAZOLAM HCL 2 MG/ML PO SYRP: 8.0000 mg | ORAL_SOLUTION | Freq: Once | ORAL | Status: DC | PRN

## 2020-04-27 MED ORDER — METHYLPREDNISOLONE SODIUM SUCC 125 MG IJ SOLR
INTRAMUSCULAR | Status: AC
  Filled 2020-04-27: qty 2

## 2020-04-27 MED ORDER — FENTANYL CITRATE (PF) 100 MCG/2ML IJ SOLN
INTRAMUSCULAR | Status: AC
  Filled 2020-04-27: qty 2

## 2020-04-27 MED ORDER — CEFAZOLIN SODIUM-DEXTROSE 2-4 GM/100ML-% IV SOLN: 2.0000 g | Freq: Once | INTRAVENOUS | Status: DC

## 2020-04-27 MED ORDER — ONDANSETRON HCL 4 MG/2ML IJ SOLN
4.0000 mg | Freq: Four times a day (QID) | INTRAMUSCULAR | Status: DC | PRN
  Administered 2020-04-28: 4 mg via INTRAVENOUS

## 2020-04-27 MED ORDER — DIPHENHYDRAMINE HCL 50 MG/ML IJ SOLN: 50.0000 mg | Freq: Once | INTRAMUSCULAR | Status: DC | PRN

## 2020-04-27 MED ORDER — MAGNESIUM SULFATE 2 GM/50ML IV SOLN
2.0000 g | Freq: Once | INTRAVENOUS | Status: AC
  Administered 2020-04-27: 2 g via INTRAVENOUS
  Filled 2020-04-27: qty 50

## 2020-04-27 MED ORDER — DIPHENHYDRAMINE HCL 50 MG/ML IJ SOLN
INTRAMUSCULAR | Status: AC
  Filled 2020-04-27: qty 1

## 2020-04-27 SURGICAL SUPPLY — 14 items
CATH BEACON 5 .035 65 KMP TIP (CATHETERS) ×3 IMPLANT
CATH RDC 6F (CATHETERS) ×3 IMPLANT
DRAPE BRACHIAL (DRAPES) ×3 IMPLANT
GLIDEWIRE STIFF .35X180X3 HYDR (WIRE) ×3 IMPLANT
GUIDEWIRE SUPER STIFF .035X180 (WIRE) ×3 IMPLANT
NEEDLE ENTRY 21GA 7CM ECHOTIP (NEEDLE) ×3 IMPLANT
PACK ANGIOGRAPHY (CUSTOM PROCEDURE TRAY) ×3 IMPLANT
SET INTRO CAPELLA COAXIAL (SET/KITS/TRAYS/PACK) ×3 IMPLANT
SET VENACAVA FILTER RETRIEVAL (MISCELLANEOUS) ×3 IMPLANT
SHEATH 12FRX45 (SHEATH) ×3 IMPLANT
SHEATH ANSEL 9 FR 45CM (SHEATH) ×3 IMPLANT
SHEATH BRITE TIP 5FRX11 (SHEATH) ×3 IMPLANT
VALVE CHECKFLO PERFORMER (SHEATH) ×3 IMPLANT
WIRE J 3MM .035X145CM (WIRE) ×3 IMPLANT

## 2020-04-27 NOTE — Progress Notes (Signed)
OT Cancellation Note  Patient Details Name: [name] MRN: [id number] DOB: [birth year]   Cancelled Treatment:    Reason Eval/Treat Not Completed: Other (comment). Consult received, chart reviewed. Pt pending vascular consult for possible procedure today and pt NPO in anticipation. Will hold OT Evaluation at this time and re-attempt at later date/time as medically appropriate pending updated plan of care.   Richrd Prime, MPH, MS, OTR/L ascom [phone] 04/13/2020, 7:53 AM

## 2020-04-27 NOTE — Progress Notes (Signed)
Pt given water to drink and chicken broth per Dr Allena Katz.

## 2020-04-27 NOTE — Progress Notes (Signed)
PT Cancellation Note  Patient Details Name: BRANDOLYN SHORTRIDGE MRN: [id number] DOB: 1966/05/21   Cancelled Treatment:    Reason Eval/Treat Not Completed: Other (comment). Consult received, chart reviewed. Pt pending vascular consult for possible procedure today and pt NPO in anticipation. Will hold PT Evaluation at this time and re-attempt at later date/time as medically appropriate pending updated plan of care.    Olga Coaster PT, DPT 8:33 AM,05/04/20

## 2020-04-27 NOTE — Op Note (Signed)
  OPERATIVE NOTE   PRE-OPERATIVE DIAGNOSIS: DVT with PE  POST-OPERATIVE DIAGNOSIS: Same  PROCEDURE: 1. Attempted Retrieval of IVC Filter 2. Inferior Vena Cavagram  SURGEON: Renford Dills, M.D.  ANESTHESIA:  Conscious sedation was administered under my direct supervision by the interventional radiology RN. IV Versed plus fentanyl were utilized. Continuous ECG, pulse oximetry and blood pressure was monitored throughout the entire procedure. Conscious sedation was for a total of 25 minutes.  ESTIMATED BLOOD LOSS: Minimal cc  FINDING(S):inferior vena cava is widely patent filter is in place in good position. Filter is removed without incident  SPECIMEN(S):  IVC filter intact  INDICATIONS:   [name] is a 54 y.o. female who presents with DVT and PE. The patient has now tolerated anticoagulation for several months. Therefore, the IVC filter is recommended to be removed. The risks and benefits were reviewed with the patient all questions were answered and they agreed to proceed with IVC filter retrieval. Oral anticoagulation will be continued.  DESCRIPTION: After obtaining full informed written consent, the patient was brought back to the Special Procedure Suite and placed in the supine position.  The patient received IV antibiotics prior to induction.  After obtaining adequate sedation, the patient was prepped and draped in the standard fashion and appropriate time out is called.     Ultrasound was placed in a sterile sleeve.The right neck was then imaged with ultrasound.   Jugular vein was not present multiple collaterals were noted.  Attempts at accessing a collateral at the base of the neck was successful with the microneedle microwire was advanced under fluoroscopy and this appears to course to the left hemithorax and what is likely an azygous venous system.  This would not be accessible for filter removal and I decided to reprepped and draped the left neck.  Ultrasound was  returned to the sterile field and a microwire used to access the left internal jugular vein.  J-wire is then advanced into the superior vena cava under fluoroscopic guidance.  5 French sheath was then placed and a Kumpe catheter with a Glidewire were negotiated into the inferior vena cava.  Glidewire was exchanged for an Amplatz superstiff wire and the retrieval sheath is positioned with the tip of the sheath at the confluence of the iliac veins inferior vena caval imaging is performed.  After review of the image the sheath is repositioned to above the filter and the snare is introduced.  Multiple different catheters snare sheaths guiding catheters were all used in an attempt to secure the hook however on imaging the hook of the catheter is now embedded underneath the endothelium of the posterior wall of the left renal vein.  Attempts at grasping the filter with a snare and pulling it free of the endothelium were not successful.  After an exhaustive attempt I elected to terminate the procedure.  Sheath is removed by pressures held the patient tolerated the procedure well and there were no immediate complications.  Interpretation: inferior vena cava is widely patent filter is in place a tilted position it is angled posterior medially. Filter is unable to be removed as the hook is not accessible.     COMPLICATIONS: None  CONDITION: [name], M.D. [facility] Office: [phone]   04/22/2020, 4:57 PM

## 2020-04-27 NOTE — Progress Notes (Signed)
Chief Complaint: Patient was seen in consultation today for gastrostomy tube  Referring Physician(s): Dr. Allena Katz  Supervising Physician: Gilmer Mor  Patient Status: [facility] - In-pt  History of Present Illness: [name] is a 54 y.o. 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 She's had progression protein calories malnutrition and failure to thrive that have led to admission for metabolic encephalopathy with significant electrolyte disorders. She also developed SBO type picture and had to have NGT placed. Nutritional assessment feels she is not getting enough po calories. PEG was suggested. GI consulted and felt given SB distention and proximity to her ileal conduit, it would be difficult placement. New CT yesterday shows improvement in SBO with decreased SB distention. Percutaneous window to stomach appears more approachable. IR is asked to eval for perc G-tube. PMHx, meds, labs, imaging, allergies reviewed. Feels better since admission.    Past Medical History:  Diagnosis Date  . COPD (chronic obstructive pulmonary disease) (HCC)   . History of urostomy   . Paraplegic spinal paralysis [facility])     History reviewed. No pertinent surgical history.  Allergies: Shellfish allergy  Medications:  Current Facility-Administered Medications:  .  0.9 %  sodium chloride infusion, , Intravenous, PRN, Enedina Finner, MD, Stopped at May 11, 2020 0057 .  0.9 %  sodium chloride infusion, , Intravenous, Continuous, Enedina Finner, MD, Last Rate: 50 mL/hr at 05-11-2020 0500, Rate Verify at May 11, 2020 0500 .  acetaminophen (TYLENOL) tablet 650 mg, 650 mg, Per Tube, Q6H PRN, Enedina Finner, MD .  alum & mag hydroxide-simeth (MAALOX/MYLANTA) 200-200-20 MG/5ML suspension 15 mL, 15 mL, Per Tube, Q6H PRN, Enedina Finner, MD .  ampicillin (OMNIPEN) 2 g in sodium chloride 0.9 % 100 mL IVPB, 2 g, Intravenous, Q6H, Enedina Finner, MD, Last Rate: 300 mL/hr at 2020-05-11 0636, 2 g at 05/11/20 0636 .   cefTRIAXone (ROCEPHIN) 2 g in sodium chloride 0.9 % 100 mL IVPB, 2 g, Intravenous, Q12H, Enedina Finner, MD, Stopped at 04/26/20 2317 .  Chlorhexidine Gluconate Cloth 2 % PADS 6 each, 6 each, Topical, Daily, Enedina Finner, MD, 6 each at 11-May-2020 [phone]  enoxaparin (LOVENOX) injection 40 mg, 40 mg, Subcutaneous, Q24H, Agbata, Tochukwu, MD, 40 mg at 04/26/20 2248 .  feeding supplement (BOOST / RESOURCE BREEZE) liquid 1 Container, 1 Container, Oral, TID BM, Duanne Guess, MD, 1 Container at 04/26/20 2247 .  feeding supplement (OSMOLITE 1.5 CAL) liquid 1,000 mL, 1,000 mL, Per Tube, Continuous, Enedina Finner, MD, Last Rate: 40 mL/hr at 04/26/20 1429, 1,000 mL at 04/26/20 1429 .  fluticasone (FLONASE) 50 MCG/ACT nasal spray 2 spray, 2 spray, Each Nare, Daily, Agbata, Tochukwu, MD, 2 spray at 2020-05-11 0804 .  fluticasone (FLOVENT HFA) 44 MCG/ACT inhaler 2 puff, 2 puff, Inhalation, BID, Katha Cabal, RPH, 2 puff at May 11, 2020 0804 .  influenza vac split quadrivalent PF (FLUARIX) injection 0.5 mL, 0.5 mL, Intramuscular, Tomorrow-1000, Agbata, Tochukwu, MD .  magnesium sulfate IVPB 2 g 50 mL, 2 g, Intravenous, Once, Katha Cabal, RPH, Last Rate: 50 mL/hr at 2020/05/11 0900, 2 g at 11-May-2020 0900 .  mirtazapine (REMERON SOL-TAB) disintegrating tablet 30 mg, 30 mg, Oral, QHS, Manuela Schwartz, NP, 30 mg at 04/26/20 2248 .  multivitamin liquid 15 mL, 15 mL, Per Tube, Daily, Enedina Finner, MD .  ondansetron (ZOFRAN) tablet 4 mg, 4 mg, Per Tube, Q6H PRN **OR** ondansetron (ZOFRAN) injection 4 mg, 4 mg, Intravenous, Q6H PRN, Enedina Finner, MD .  pantoprazole sodium (PROTONIX) 40 mg/20 mL oral suspension  20 mg, 20 mg, Per Tube, Daily, Tressie Ellis, RPH, 20 mg at 04/26/20 0801 .  phenol (CHLORASEPTIC) mouth spray 1 spray, 1 spray, Mouth/Throat, PRN, Agbata, Tochukwu, MD .  sodium chloride flush (NS) 0.9 % injection 10-40 mL, 10-40 mL, Intracatheter, PRN, Enedina Finner, MD .  tiotropium [facility]) inhalation capsule ([facility]  use ONLY) 18 mcg, 18 mcg, Inhalation, Daily, Agbata, Tochukwu, MD, 18 mcg at 04/14/2020 0805    History reviewed. No pertinent family history.  Social History   Socioeconomic History  . Marital status: Divorced    Spouse name: Not on file  . Number of children: Not on file  . Years of education: Not on file  . Highest education level: Not on file  Occupational History  . Not on file  Tobacco Use  . Smoking status: Never Smoker  . Smokeless tobacco: Never Used  Substance and Sexual Activity  . Alcohol use: No  . Drug use: Not on file  . Sexual activity: Not on file  Other Topics Concern  . Not on file  Social History Narrative  . Not on file   Social Determinants of Health   Financial Resource Strain:   . Difficulty of Paying Living Expenses: Not on file  Food Insecurity:   . Worried About Programme researcher, broadcasting/film/video in the Last Year: Not on file  . Ran Out of Food in the Last Year: Not on file  Transportation Needs:   . Lack of Transportation (Medical): Not on file  . Lack of Transportation (Non-Medical): Not on file  Physical Activity:   . Days of Exercise per Week: Not on file  . Minutes of Exercise per Session: Not on file  Stress:   . Feeling of Stress : Not on file  Social Connections:   . Frequency of Communication with Friends and Family: Not on file  . Frequency of Social Gatherings with Friends and Family: Not on file  . Attends Religious Services: Not on file  . Active Member of Clubs or Organizations: Not on file  . Attends Banker Meetings: Not on file  . Marital Status: Not on file    Review of Systems: A 12 point ROS discussed and pertinent positives are indicated in the HPI above.  All other systems are negative.  Review of Systems  Vital Signs: BP 95/62 (BP Location: Left Arm)   Pulse (!) 109   Temp 99.1 F (37.3 C)   Resp 20   Ht 5\' 1"  (1.549 m)   Wt 37.1 kg   SpO2 97%   BMI 15.45 kg/m   Physical Exam Constitutional:       General: She is not in acute distress.    Appearance: She is ill-appearing.  HENT:     Mouth/Throat:     Mouth: Mucous membranes are moist.     Pharynx: Oropharynx is clear.  Cardiovascular:     Rate and Rhythm: Normal rate and regular rhythm.     Heart sounds: Normal heart sounds.  Pulmonary:     Effort: Pulmonary effort is normal. No respiratory distress.     Breath sounds: Normal breath sounds.  Abdominal:     General: There is distension.     Palpations: Abdomen is soft.     Tenderness: There is no abdominal tenderness. There is no guarding.     Comments: Ileal conduit noted  Skin:    General: Skin is warm and dry.  Neurological:     General: No focal deficit  present.     Mental Status: She is alert and oriented to person, place, and time.  Psychiatric:        Thought Content: Thought content normal.        Judgment: Judgment normal.       Imaging: EEG  Result Date: 04/23/2020 Charlsie Quest, MD     04/23/2020  2:13 PM Patient Name: SHERANDA SEABROOKS MRN: [id number] Epilepsy Attending: Charlsie Quest Referring Physician/Provider: Dr. 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  EEG to evaluate for seizures. Level of alertness: Lethargic AEDs during EEG study: None Technical aspects: This EEG study was done with scalp electrodes positioned according to the 10-20 International system of electrode placement. Electrical activity was acquired at a sampling rate of  and reviewed with a high frequency filter of  and a low frequency filter of . EEG data were recorded continuously and digitally stored. Description: EEG showed continuous generalized 3 to 5 cm in delta slowing.  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  At times these discharges appeared rhythmic with  some evolution and frequency to 1.5 to 2 Hz without any evolution or morphology.  Physiologic photic driving was not seen during photic stimulation.  Hyperventilation was not performed. ABNORMALITY - Continuous slow, generalized - Periodic epileptiform discharges with triphasic morphology, generalized IMPRESSION: This study showed periodic epileptiform discharges with triphasic morphology which at times appeared rhythmic with some evolution and frequency and not therefore on the ictal-interictal continuum.  Additionally, there is evidence of moderate to severe diffuse encephalopathy, nonspecific to etiology.  No definite seizures were seen during the study. Priyanka Annabelle Harman   CT ABDOMEN PELVIS WO CONTRAST  Result Date: 04/26/2020 CLINICAL DATA:  COPD, quadriplegia and cystectomy with ileal conduit diversion. Nausea, vomiting and diffuse abdominal pain. EXAM: CT ABDOMEN AND PELVIS WITHOUT CONTRAST TECHNIQUE: Multidetector CT imaging of the abdomen and pelvis was performed following the standard protocol without IV contrast. COMPARISON:  Prior CT abdomen/pelvis 04/17/2020 FINDINGS: Lower chest: Small bilateral pleural effusions with associated atelectasis. Persistent but improving patchy airspace opacity in the periphery of the posterior right upper lobe. Interval clearing of airspace disease from the right lower lobe. Hepatobiliary: Normal hepatic contour and morphology. Small low-attenuation lesion in the posterior right hepatic lobe previously characterized as a cyst. Cholelithiasis again noted. The gallstones are hyperattenuating. No biliary ductal dilatation. Pancreas: Unremarkable. No pancreatic ductal dilatation or surrounding inflammatory changes. Spleen: Normal in size without focal abnormality. Adrenals/Urinary Tract: Surgical changes of prior cystectomy with left lower quadrant diverting ileostomy. Limited evaluation of the kidneys in the absence of intravenous contrast. Perhaps mild right-sided  hydronephrosis versus fullness of the collecting system. Punctate parenchymal calcifications versus nonobstructing stones in the right renal parenchyma. Visualized ureters are unremarkable. Stomach/Bowel: Gastric tube present within the stomach. Interval resolution of small bowel obstruction. However, there has been development of small volume ascites which is new. Vascular/Lymphatic: Limited evaluation in the absence of intravenous contrast. IVC filter is present with penetration of the struts, 1 of the right-sided struts may penetrate into the duodenum. Reproductive: IUD in place. Similar appearance of a 2.8 cm low-attenuation lesion affiliated with the right adnexa. The left adnexa is unremarkable. Other: Small volume ascites throughout the abdomen and pelvis. Musculoskeletal: No acute or significant osseous findings. IMPRESSION: 1. While there has been significant improvement in the previously noted  small bowel obstruction, there has been interval development of small volume ascites of uncertain etiology. 2. Stable appearance of IVC filter with multifocal strut penetration including 1 strut which may enter the duodenal lumen. This could represent a source for the patient's abdominal pain. Consider referral to Interventional Radiology for evaluation of possible filter retrieval. 3. Small bilateral pleural effusions with associated atelectasis. 4. Improved right lower lobe airspace opacity with new patchy airspace disease in the posterior and lateral right upper lobe. Suspect small volume aspiration. 5. Gastric tube in place. 6. IUD in place. 7. Additional ancillary findings as above. Electronically Signed   By: Malachy Moan M.D.   On: 04/26/2020 16:09   DG Abd 1 View  Result Date: 04/25/2020 CLINICAL DATA:  Abdominal pain, vomiting and weakness. EXAM: ABDOMEN - 1 VIEW COMPARISON:  04/24/2020 radiograph and prior studies FINDINGS: An NG tube is noted within the proximal-mid stomach. Mild gastric  distention is noted. Gas within the colon is noted. No dilated small bowel loops are present. IVC filter, spinal cord stimulator and IUD again noted. No other changes noted. IMPRESSION: NG tube within the proximal-mid stomach with mild gastric distention. Electronically Signed   By: Harmon Pier M.D.   On: 04/25/2020 18:40   DG Abd 1 View  Result Date: 04/24/2020 CLINICAL DATA:  Small bowel obstruction EXAM: ABDOMEN - 1 VIEW COMPARISON:  Yesterday FINDINGS: A few loops of distended small bowel are seen in the central abdomen. Enteric contrast is seen throughout the nondilated colon. Enteric tube remains in good position. Stable aeration at the bases. No concerning mass effect or gas collection. Stable postoperative changes which are multifocal IMPRESSION: Stable findings of partial small bowel obstruction. Electronically Signed   By: Marnee Spring M.D.   On: 04/24/2020 09:53   DG Abd 1 View  Result Date: 04/20/2020 CLINICAL DATA:  Small-bowel obstruction EXAM: ABDOMEN - 1 VIEW COMPARISON:  04/21/2020 FINDINGS: Nasogastric tube remains well positioned within the stomach. Oral contrast is seen within large and small bowel. Decreased caliber of small bowel loops. No gross free intraperitoneal air. IVC filter in place. IMPRESSION: Oral contrast within large and small bowel. Decreased caliber of small bowel loops. Electronically Signed   By: Duanne Guess D.O.   On: 04/07/2020 08:07   DG Abd 1 View  Result Date: 04/19/2020 CLINICAL DATA:  Small bowel obstruction EXAM: ABDOMEN - 1 VIEW COMPARISON:  Yesterday FINDINGS: Continued small bowel obstruction with dilated loops measuring up to 5 cm in the central abdomen. Enteric tube remains in good position. Baclofen pump, IUD, and IVC filter. Prominent osteopenia. IMPRESSION: Continued small bowel obstruction. Electronically Signed   By: Marnee Spring M.D.   On: 04/19/2020 06:45   CT HEAD WO CONTRAST  Result Date: 04/03/2020 CLINICAL DATA:  Mental  status change with unknown cause EXAM: CT HEAD WITHOUT CONTRAST TECHNIQUE: Contiguous axial images were obtained from the base of the skull through the vertex without intravenous contrast. COMPARISON:  01/20/2008 FINDINGS: Brain: No evidence of acute infarction, hemorrhage, hydrocephalus, extra-axial collection or mass lesion/mass effect. Small remote biparietal cortically based insult. Vascular: No hyperdense vessel or unexpected calcification. Skull: Normal. Negative for fracture or focal lesion. Retro dental ligamentous thickening with some degree of canal narrowing, assessment limited by coverage and stable when compared to prior. Sinuses/Orbits: No acute finding. IMPRESSION: No acute finding or change from 2009. Electronically Signed   By: Marnee Spring M.D.   On: 04/23/2020 10:11   CT Abdomen Pelvis W Contrast  Result  Date: 04/06/2020 CLINICAL DATA:  Abdominal pain. Nausea, vomiting, and weakness for 4 days. Patient is paraplegic. EXAM: CT ABDOMEN AND PELVIS WITH CONTRAST TECHNIQUE: Multidetector CT imaging of the abdomen and pelvis was performed using the standard protocol following bolus administration of intravenous contrast. CONTRAST:  59mL OMNIPAQUE IOHEXOL 300 MG/ML  SOLN COMPARISON:  None. FINDINGS: Lower chest: Opacity in the right base as seen on series 4, image 1 is consistent with an infectious or inflammatory process. Probable atelectasis in the left base. No other abnormalities in the lung bases. Hepatobiliary: There is a cyst or hemangioma in the right hepatic lobe. The liver is otherwise normal. Cholelithiasis is identified without obvious wall thickening. The portal vein is patent. Pancreas: Unremarkable. No pancreatic ductal dilatation or surrounding inflammatory changes. Spleen: Normal in size without focal abnormality. Adrenals/Urinary Tract: Adrenal glands are normal. Small bilateral renal cysts are noted. No suspicious masses. No hydronephrosis. There is a punctate stone in the  right kidney on coronal image 49 measuring 2 mm. Visualized portions of the ureters are normal. No ureterectasis or ureteral stones. The patient has a urostomy which is decompressed on this study and empties in the left lower quadrant. Stomach/Bowel: Stomach is unremarkable. The small bowel is dilated nearly along its entire length. There appear to be a few decompressed loops of small bowel in the right pelvis as seen on coronal image 30. There appears to be pneumatosis associated with some loops of small bowel in the left upper quadrant, well seen on coronal image 33. There is marked fecal loading in the rectum and distal sigmoid colon. The ascending colon and transverse colon are decompressed. The descending colon and proximal sigmoid colon are not well visualized. The appendix is not visualized but there is no secondary evidence of appendicitis. 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 The abdominal aorta is nonaneurysmal without significant atherosclerotic change. No gas seen in the venous system despite the pneumatosis. No adenopathy. Reproductive: The patient has an IUD. There appears to be a dominant cyst in the region the right adnexa, simple in appearance measuring up to 2.7 cm. No abnormalities in the left adnexa. Other: There is some free fluid in the abdomen, likely secondary to the bowel obstruction. Musculoskeletal: No acute bony abnormalities identified. IMPRESSION: 1. High-grade small bowel obstruction. The transition point is likely in the right lower abdomen a right pelvis with some decompressed loops in this region. There is pneumatosis associated with left upper quadrant loops of small bowel. No free air on today's study to suggest perforation. 2. Marked fecal loading in the rectum and distal sigmoid colon. 3. Infiltrate in the right base worrisome for pneumonia. 4. Decompression of the ascending and transverse colon. Poor visualization of the descending colon and proximal sigmoid colon.  5. Left lower quadrant urostomy.  No renal abnormalities are noted. 6. Cholelithiasis. 7. Probable dominant cyst/follicle in the right ovary, simple in appearance measuring 2.7 cm. No follow-up necessary. Findings called to Dr. Larinda Buttery. Electronically Signed   By: Gerome Sam III M.D   On: 04/03/2020 13:55   DG Abd Portable 1V  Result Date: 04/23/2020 CLINICAL DATA:  Small-bowel obstruction EXAM: PORTABLE ABDOMEN - 1 VIEW COMPARISON:  None. FINDINGS: Nasogastric tube is seen within the left upper quadrant of the abdomen overlying the gastric fundus. Contrast previously noted within the distal small bowel has now passed into the colon and rectum which is decompressed. Multiple gas-filled dilated loops of small bowel are still identified within the mid abdomen and, together, the findings  are compatible with a partial small bowel obstruction. No other changes seen. Inferior vena cava filter, intrathecal pain pump, and intrauterine device noted. IMPRESSION: Partial small bowel obstruction with passage of contrast into a decompressed colon and rectum. Electronically Signed   By: Helyn NumbersAshesh  Parikh MD   On: 04/23/2020 08:28   DG Abd Portable 1V-Small Bowel Obstruction Protocol-24 hr delay  Result Date: 04/21/2020 CLINICAL DATA:  Small bowel obstruction EXAM: PORTABLE ABDOMEN - 1 VIEW COMPARISON:  Yesterday FINDINGS: Definite improvement in small bowel distension, although still diffusely and abnormally dilated up to 3.4 cm. Some contrast has reached the rectum and been evacuated Enteric tube remains in good position. Stable pre-existing hardware. IMPRESSION: Improved small bowel obstruction with interval passage of contrast into colon. Electronically Signed   By: Marnee SpringJonathon  Watts M.D.   On: 04/21/2020 10:23   DG Abd Portable 1V-Small Bowel Obstruction Protocol-initial, 8 hr delay  Result Date: 04/20/2020 CLINICAL DATA:  Nausea, vomiting EXAM: PORTABLE ABDOMEN - 1 VIEW COMPARISON:  03/21/2020 FINDINGS: NG tube  is in the stomach. Contrast material is seen within the stomach. Dilated small bowel loops are again noted in the abdomen and upper pelvis compatible with small bowel obstruction. No significant change since prior study. No organomegaly. IVC filter in place. IMPRESSION: Continued dilated small bowel loops compatible with small bowel obstruction. No change since prior study. Contrast material seen within the stomach presumably injected through the NG tube. Electronically Signed   By: Charlett NoseKevin  Dover M.D.   On: 04/20/2020 19:14   DG Abd Portable 1V  Result Date: 04/20/2020 CLINICAL DATA:  Small bowel obstruction EXAM: PORTABLE ABDOMEN - 1 VIEW COMPARISON:  04/19/2020 FINDINGS: Enteric tube tip is in the left upper quadrant consistent with location in the upper stomach. Medication pump in the right lower quadrant. IVC filter. Surgical clips in the pelvis. Intrauterine device. Left lower quadrant ostomy. Gaseous distention of upper abdominal small bowel with decreased gas in the colon. Stool-filled rectum. Changes are consistent with small bowel obstruction. No significant improvement since prior study. Degenerative changes in the spine and hips. Vascular calcifications. IMPRESSION: Persistent gaseous distention of upper abdominal small bowel consistent with small bowel obstruction. Electronically Signed   By: Burman NievesWilliam  Stevens M.D.   On: 04/20/2020 05:11   DG Abd Portable 1 View  Result Date: 04/08/2020 CLINICAL DATA:  NG tube placement EXAM: PORTABLE ABDOMEN - 1 VIEW COMPARISON:  11/21/2010 FINDINGS: NG tube coils in the fundus of the stomach. IMPRESSION: NG tube in the stomach. Electronically Signed   By: Charlett NoseKevin  Dover M.D.   On: 04/17/2020 15:51   US EKG SITE RITE  Result Date: 04/21/2020 If Site Rite image not attached, placement could not be confirmed due to current cardiac rhythm.  DG FL GUIDED LUMBAR PUNCTURE  Result Date: 15-Sep-2019 CLINICAL DATA:  Acute encephalopathy. EXAM: DIAGNOSTIC LUMBAR  PUNCTURE UNDER FLUOROSCOPIC GUIDANCE FLUOROSCOPY TIME:  Fluoroscopy Time:  1 minutes 18 seconds Radiation Exposure Index (if provided by the fluoroscopic device): 14.2 mGy Number of Acquired Spot Images: 2 PROCEDURE: After discussing the risks and benefits of this procedure with the patient's son informed consent was obtained. The patient was evaluated under fluoroscopy. The large ulceration and adjacent inflammatory tissue on the patient's lower back and sacrum is over the site that we would use for lumbar puncture making access not possible. In addition the patient's pain pump in the anterior abdomen is overlying the spine making evaluation of an access site difficult. Lumbar puncture not performed. IMPRESSION: Lumbar puncture  not performed as discussed above. Electronically Signed   By: Maisie Fus  Register   On: 04/17/2020 16:44    Labs:  CBC: Recent Labs    04/21/20 0415 04/08/2020 0542 04/23/20 1222 04/26/20 0543  WBC 19.3* 15.8* 11.6* 13.2*  HGB 9.4* 9.4* 11.0* 11.0*  HCT 30.5* 28.9* 34.2* 35.1*  PLT 469* 465* 402* 317    COAGS: No results for input(s): INR, APTT in the last 8760 hours.  BMP: Recent Labs    04/23/20 1401 04/24/20 0500 04/26/20 0543 04/30/2020 0737  NA 139 145 145 142  K 3.6 3.4* 4.2 3.9  CL 104 107 110 108  CO2 27 32 29 27  GLUCOSE 118* 112* 170* 96  BUN 6 7 16 16   CALCIUM 7.2* 7.5* 7.7* 7.5*  CREATININE <0.30* <0.30* <0.30* <0.30*  GFRNONAA NOT CALCULATED NOT CALCULATED NOT CALCULATED NOT CALCULATED    LIVER FUNCTION TESTS: Recent Labs    04/04/2020 1109 04/24/2020 0542 04/26/20 0543  BILITOT 0.9 0.5 0.2*  AST 13* 13* 11*  ALT 8 6 6   ALKPHOS 72 61 42  PROT 8.6* 5.9* 5.6*  ALBUMIN 3.5 2.4* 2.2*    TUMOR MARKERS: No results for input(s): AFPTM, CEA, CA199, CHROMGRNA in the last 8760 hours.  Assessment and Plan: PCM and FTT SBO - improving Imaging reviewed with Dr. 04/28/20, stomach approachable for perc G-tube. Will tentatively plan for perc G-tube  placement on Friday as long as SBO cont to improve. Explained procedure to pt who is agreeable. Risks and benefits image guided gastrostomy tube placement was discussed with the patient including, but not limited to the need for a barium enema during the procedure, bleeding, infection, peritonitis and/or damage to adjacent structures.  All of the patient's questions were answered, patient is agreeable to proceed.  Consent signed and in chart.    Thank you for this interesting consult.  I greatly enjoyed meeting [name] and look forward to participating in their care.  A copy of this report was sent to the requesting provider on this date.  Electronically Signed: Wednesday, PA-C 04/20/2020, 9:11 AM   I spent a total of 20 minutes in face to face in clinical consultation, greater than 50% of which was counseling/coordinating care for G-tube

## 2020-04-27 NOTE — Treatment Plan (Signed)
Pt to unit via bed at this time , pt is awake, talking, states the chicken broth she got in PACU was the best she ever had and she was hungry. VSS at this time, pulse high at 126. Pt had noted stool and peri care and bed pad changed at this time. Moderate stool noted. Pt sheath to left neck is clean dry and intact with gauze and tegaderm. Report from [facility], [employer] fund[employer]. Tube feeds resumed at this time at a rate of 38ml/hour. New bag hung. Sister visiting at this time.

## 2020-04-27 NOTE — Consult Note (Signed)
[facility] Vascular Consult Note  MRN : [id number]  [name] is a 54 y.o. (July 16, 1965) female who presents with chief complaint of  Chief Complaint  Patient presents with  . Nausea  . Emesis  . 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    CT scan of abdomen and pelvis shows High-grade small bowel obstruction. The transition point is likely in the right lower abdomen a right pelvis with some decompressed loops in this region. There is pneumatosis associated with left upper quadrant loops of small bowel. No free air on today's study to suggest perforation. Patient Marked fecal loading in the rectum and distal sigmoid colon. Infiltrate in the right base worrisome for pneumonia. Decompression of the ascending and transverse colon. Poor visualization of the descending colon and proximal sigmoid colon. Left lower quadrant urostomy. No renal abnormalities are noted. Cholelithiasis.  RePete CT abdomen and pelvis on April 26, 2020 was notable for: Stable appearance of IVC filter with multifocal strut penetration including 1 strut which may enter the duodenal lumen. This could represent a source for the patient's abdominal pain.   Vascular surgery was consulted by Dr. Allena Katz for possible IVC filter removal.  Current Facility-Administered Medications  Medication Dose Route Frequency Provider Last Rate Last Admin  . 0.9 %  sodium chloride infusion   Intravenous PRN Enedina Finner, MD   Stopped at 04/20/2020 0057  . 0.9 %  sodium chloride infusion   Intravenous Continuous Enedina Finner, MD 50 mL/hr at 04/16/2020 1132 New Bag at 04/26/2020 1132  . acetaminophen (TYLENOL) tablet 650 mg  650 mg Per Tube Q6H PRN Enedina Finner, MD      . alum &  mag hydroxide-simeth (MAALOX/MYLANTA) 200-200-20 MG/5ML suspension 15 mL  15 mL Per Tube Q6H PRN Enedina Finner, MD      . ampicillin (OMNIPEN) 2 g in sodium chloride 0.9 % 100 mL IVPB  2 g Intravenous Q6H Enedina Finner, MD 300 mL/hr at 04/20/2020 1136 2 g at 04/06/2020 1136  . cefTRIAXone (ROCEPHIN) 2 g in sodium chloride 0.9 % 100 mL IVPB  2 g Intravenous Q12H Enedina Finner, MD 200 mL/hr at 04/26/2020 1012 2 g at 04/13/2020 1012  . Chlorhexidine Gluconate Cloth 2 % PADS 6 each  6 each Topical Daily Enedina Finner, MD   6 each at 04/24/2020 [phone]  . enoxaparin (LOVENOX) injection 40 mg  40 mg Subcutaneous Q24H Agbata, Tochukwu, MD   40 mg at 04/26/20 2248  . feeding supplement (BOOST / RESOURCE BREEZE) liquid 1 Container  1 Container Oral TID BM Duanne Guess, MD   1 Container at 04/26/20 2247  . feeding supplement (OSMOLITE 1.5 CAL) liquid 1,000 mL  1,000 mL Per Tube Continuous Enedina Finner, MD 40 mL/hr at 04/26/20 1429 1,000 mL at 04/26/20 1429  . fluticasone (FLONASE) 50 MCG/ACT nasal spray 2 spray  2 spray Each Nare Daily Agbata, Tochukwu, MD   2 spray at 04/19/2020 0804  . fluticasone (FLOVENT HFA) 44 MCG/ACT inhaler 2 puff  2 puff Inhalation BID Katha Cabal, RPH   2 puff at 04/13/2020 0804  . influenza vac split quadrivalent PF (FLUARIX) injection 0.5 mL  0.5 mL Intramuscular Tomorrow-1000 Agbata, Tochukwu, MD      . mirtazapine (  REMERON SOL-TAB) disintegrating tablet 30 mg  30 mg Oral QHS Manuela SchwartzMorrison, Brenda, NP   30 mg at 04/26/20 2248  . multivitamin liquid 15 mL  15 mL Per Tube Daily Enedina FinnerPatel, Sona, MD      . ondansetron [facility](ZOFRAN) tablet 4 mg  4 mg Per Tube Q6H PRN Enedina FinnerPatel, Sona, MD       Or  . ondansetron [facility](ZOFRAN) injection 4 mg  4 mg Intravenous Q6H PRN Enedina FinnerPatel, Sona, MD      . pantoprazole sodium (PROTONIX) 40 mg/20 mL oral suspension 20 mg  20 mg Per Tube Daily Tressie EllisChappell, Alex B, RPH   20 mg at 04/26/20 0801  . phenol (CHLORASEPTIC) mouth spray 1 spray  1 spray Mouth/Throat PRN Agbata, Tochukwu, MD      . sodium  chloride flush (NS) 0.9 % injection 10-40 mL  10-40 mL Intracatheter PRN Enedina FinnerPatel, Sona, MD      . tiotropium [facility](SPIRIVA) inhalation capsule ([facility] use ONLY) 18 mcg  18 mcg Inhalation Daily Agbata, Tochukwu, MD   18 mcg at 04/20/2020 0805   Past Medical History:  Diagnosis Date  . COPD (chronic obstructive pulmonary disease) (HCC)   . History of urostomy   . Paraplegic spinal paralysis [facility](HCC)    History reviewed. No pertinent surgical history.  Social History Social History   Tobacco Use  . Smoking status: Never Smoker  . Smokeless tobacco: Never Used  Substance Use Topics  . Alcohol use: No  . Drug use: Not on file   Family History History reviewed. No pertinent family history.  Denies family history of peripheral artery disease, venous disease or renal disease.  Allergies  Allergen Reactions  . Shellfish Allergy Shortness Of Breath   REVIEW OF SYSTEMS (Negative unless checked)  Constitutional: [] Weight loss  [] Fever  [] Chills Cardiac: [] Chest pain   [] Chest pressure   [] Palpitations   [] Shortness of breath when laying flat   [] Shortness of breath at rest   [] Shortness of breath with exertion. Vascular:  [] Pain in legs with walking   [] Pain in legs at rest   [] Pain in legs when laying flat   [] Claudication   [] Pain in feet when walking  [] Pain in feet at rest  [] Pain in feet when laying flat   [x] History of DVT   [] Phlebitis   [] Swelling in legs   [] Varicose veins   [] Non-healing ulcers Pulmonary:   [] Uses home oxygen   [] Productive cough   [] Hemoptysis   [] Wheeze  [] COPD   [] Asthma Neurologic:  [] Dizziness  [] Blackouts   [] Seizures   [] History of stroke   [] History of TIA  [] Aphasia   [] Temporary blindness   [] Dysphagia   [] Weakness or numbness in arms   [] Weakness or numbness in legs Musculoskeletal:  [] Arthritis   [] Joint swelling   [] Joint pain   [] Low back pain Hematologic:  [] Easy bruising  [] Easy bleeding   [] Hypercoagulable state   [x] Anemic  [] Hepatitis Gastrointestinal:   [] Blood in stool   [] Vomiting blood  [] Gastroesophageal reflux/heartburn   [] Difficulty swallowing. Genitourinary:  [] Chronic kidney disease   [] Difficult urination  [] Frequent urination  [] Burning with urination   [] Blood in urine Skin:  [] Rashes   [] Ulcers   [] Wounds Psychological:  [] History of anxiety   []  History of major depression.  Physical Examination  Vitals:   04/26/2020 0402 04/02/2020 0500 04/15/2020 0745 04/12/2020 1127  BP: 97/64  95/62 95/61  Pulse: (!) 117  (!) 109 (!) 118  Resp: 20  20 18   Temp: 99.2 F (  37.3 C)  99.1 F (37.3 C) 99.2 F (37.3 C)  TempSrc: Oral   Oral  SpO2: 98%  97% 97%  Weight:  37.1 kg    Height:       Body mass index is 15.45 kg/m. Gen: Quadriplegic, NAD Head: [place]/AT, No temporalis wasting. Prominent temp pulse not noted. Ear/Nose/Throat: Hearing grossly intact, nares w/o erythema or drainage, oropharynx w/o Erythema/Exudate Eyes: Sclera non-icteric, conjunctiva clear Neck: Trachea midline.  No JVD.  Pulmonary:  Good air movement, respirations not labored, equal bilaterally.  Cardiac: RRR, normal S1, S2. Vascular:  Vessel Right Left  Radial Palpable Palpable  Ulnar Palpable Palpable  Brachial Palpable Palpable  Carotid Palpable, without bruit Palpable, without bruit  Aorta Not palpable N/A  Femoral Palpable Palpable  Popliteal Palpable Palpable  PT Palpable Palpable  DP Palpable Palpable   Gastrointestinal: soft, non-tender/non-distended. No guarding/reflex. urostomy in place in LLQ.Patient has on the right lower quadrant subcutaneous baclofen pump. Musculoskeletal: M/S 5/5 throughout.  Extremities without ischemic changes.  No deformity or atrophy. No edema. Neurologic: Sensation grossly intact in extremities.  Symmetrical.  Speech is fluent. Motor exam as listed above. Psychiatric: Judgment intact, Mood & affect appropriate for pt's clinical situation. Dermatologic: Sacral wound Lymph : No Cervical, Axillary, or Inguinal  lymphadenopathy.  CBC Lab Results  Component Value Date   WBC 13.2 (H) 04/26/2020   HGB 11.0 (L) 04/26/2020   HCT 35.1 (L) 04/26/2020   MCV 89.3 04/26/2020   PLT 317 04/26/2020   BMET    Component Value Date/Time   NA 142 04/30/2020 0737   K 3.9 04/10/2020 0737   CL 108 05/01/2020 0737   CO2 27 04/28/2020 0737   GLUCOSE 96 04/16/2020 0737   BUN 16 04/26/2020 0737   CREATININE <0.30 (L) 04/14/2020 0737   CALCIUM 7.5 (L) 05/02/2020 0737   GFRNONAA NOT CALCULATED 04/13/2020 0737   GFRAA NOT CALCULATED 10/13/2018 1411   CrCl cannot be calculated (This lab value cannot be used to calculate CrCl because it is not a number: <0.30).  COAG No results found for: INR, PROTIME  Radiology EEG  Result Date: 04/23/2020 Charlsie Quest, MD     04/23/2020  2:13 PM Patient Name: DAMONIE FURNEY MRN: [id number] Epilepsy Attending: Charlsie Quest Referring Physician/Provider: Dr. 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  EEG to evaluate for seizures. Level of alertness: Lethargic AEDs during EEG study: None Technical aspects: This EEG study was done with scalp electrodes positioned according to the 10-20 International system of electrode placement. Electrical activity was acquired at a sampling rate of  and reviewed with a high frequency filter of  and a low frequency filter of . EEG data were recorded continuously and digitally stored. Description: EEG showed continuous generalized 3 to 5 cm in delta slowing.  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  At times these discharges appeared rhythmic with some evolution and frequency to 1.5 to 2 Hz without any evolution or morphology.  Physiologic photic driving was not seen during photic stimulation.  Hyperventilation was not  performed. ABNORMALITY - Continuous slow, generalized - Periodic epileptiform discharges with triphasic morphology, generalized IMPRESSION: This study showed periodic epileptiform discharges with triphasic morphology which at times appeared rhythmic with some evolution and frequency and not therefore on the ictal-interictal continuum.  Additionally, there is evidence of moderate to  severe diffuse encephalopathy, nonspecific to etiology.  No definite seizures were seen during the study. Priyanka Annabelle Harman   CT ABDOMEN PELVIS WO CONTRAST  Result Date: 04/26/2020 CLINICAL DATA:  COPD, quadriplegia and cystectomy with ileal conduit diversion. Nausea, vomiting and diffuse abdominal pain. EXAM: CT ABDOMEN AND PELVIS WITHOUT CONTRAST TECHNIQUE: Multidetector CT imaging of the abdomen and pelvis was performed following the standard protocol without IV contrast. COMPARISON:  Prior CT abdomen/pelvis 04/17/2020 FINDINGS: Lower chest: Small bilateral pleural effusions with associated atelectasis. Persistent but improving patchy airspace opacity in the periphery of the posterior right upper lobe. Interval clearing of airspace disease from the right lower lobe. Hepatobiliary: Normal hepatic contour and morphology. Small low-attenuation lesion in the posterior right hepatic lobe previously characterized as a cyst. Cholelithiasis again noted. The gallstones are hyperattenuating. No biliary ductal dilatation. Pancreas: Unremarkable. No pancreatic ductal dilatation or surrounding inflammatory changes. Spleen: Normal in size without focal abnormality. Adrenals/Urinary Tract: Surgical changes of prior cystectomy with left lower quadrant diverting ileostomy. Limited evaluation of the kidneys in the absence of intravenous contrast. Perhaps mild right-sided hydronephrosis versus fullness of the collecting system. Punctate parenchymal calcifications versus nonobstructing stones in the right renal parenchyma. Visualized ureters are  unremarkable. Stomach/Bowel: Gastric tube present within the stomach. Interval resolution of small bowel obstruction. However, there has been development of small volume ascites which is new. Vascular/Lymphatic: Limited evaluation in the absence of intravenous contrast. IVC filter is present with penetration of the struts, 1 of the right-sided struts may penetrate into the duodenum. Reproductive: IUD in place. Similar appearance of a 2.8 cm low-attenuation lesion affiliated with the right adnexa. The left adnexa is unremarkable. Other: Small volume ascites throughout the abdomen and pelvis. Musculoskeletal: No acute or significant osseous findings. IMPRESSION: 1. While there has been significant improvement in the previously noted small bowel obstruction, there has been interval development of small volume ascites of uncertain etiology. 2. Stable appearance of IVC filter with multifocal strut penetration including 1 strut which may enter the duodenal lumen. This could represent a source for the patient's abdominal pain. Consider referral to Interventional Radiology for evaluation of possible filter retrieval. 3. Small bilateral pleural effusions with associated atelectasis. 4. Improved right lower lobe airspace opacity with new patchy airspace disease in the posterior and lateral right upper lobe. Suspect small volume aspiration. 5. Gastric tube in place. 6. IUD in place. 7. Additional ancillary findings as above. Electronically Signed   By: Malachy Moan M.D.   On: 04/26/2020 16:09   DG Abd 1 View  Result Date: 04/25/2020 CLINICAL DATA:  Abdominal pain, vomiting and weakness. EXAM: ABDOMEN - 1 VIEW COMPARISON:  04/24/2020 radiograph and prior studies FINDINGS: An NG tube is noted within the proximal-mid stomach. Mild gastric distention is noted. Gas within the colon is noted. No dilated small bowel loops are present. IVC filter, spinal cord stimulator and IUD again noted. No other changes noted.  IMPRESSION: NG tube within the proximal-mid stomach with mild gastric distention. Electronically Signed   By: Harmon Pier M.D.   On: 04/25/2020 18:40   DG Abd 1 View  Result Date: 04/24/2020 CLINICAL DATA:  Small bowel obstruction EXAM: ABDOMEN - 1 VIEW COMPARISON:  Yesterday FINDINGS: A few loops of distended small bowel are seen in the central abdomen. Enteric contrast is seen throughout the nondilated colon. Enteric tube remains in good position. Stable aeration at the bases. No concerning mass effect or gas collection. Stable postoperative changes which are multifocal IMPRESSION: Stable findings of  partial small bowel obstruction. Electronically Signed   By: Marnee Spring M.D.   On: 04/24/2020 09:53   DG Abd 1 View  Result Date: 04/28/2020 CLINICAL DATA:  Small-bowel obstruction EXAM: ABDOMEN - 1 VIEW COMPARISON:  04/21/2020 FINDINGS: Nasogastric tube remains well positioned within the stomach. Oral contrast is seen within large and small bowel. Decreased caliber of small bowel loops. No gross free intraperitoneal air. IVC filter in place. IMPRESSION: Oral contrast within large and small bowel. Decreased caliber of small bowel loops. Electronically Signed   By: Duanne Guess D.O.   On: 04/05/2020 08:07   DG Abd 1 View  Result Date: 04/19/2020 CLINICAL DATA:  Small bowel obstruction EXAM: ABDOMEN - 1 VIEW COMPARISON:  Yesterday FINDINGS: Continued small bowel obstruction with dilated loops measuring up to 5 cm in the central abdomen. Enteric tube remains in good position. Baclofen pump, IUD, and IVC filter. Prominent osteopenia. IMPRESSION: Continued small bowel obstruction. Electronically Signed   By: Marnee Spring M.D.   On: 04/19/2020 06:45   CT HEAD WO CONTRAST  Result Date: 04/23/2020 CLINICAL DATA:  Mental status change with unknown cause EXAM: CT HEAD WITHOUT CONTRAST TECHNIQUE: Contiguous axial images were obtained from the base of the skull through the vertex without  intravenous contrast. COMPARISON:  01/20/2008 FINDINGS: Brain: No evidence of acute infarction, hemorrhage, hydrocephalus, extra-axial collection or mass lesion/mass effect. Small remote biparietal cortically based insult. Vascular: No hyperdense vessel or unexpected calcification. Skull: Normal. Negative for fracture or focal lesion. Retro dental ligamentous thickening with some degree of canal narrowing, assessment limited by coverage and stable when compared to prior. Sinuses/Orbits: No acute finding. IMPRESSION: No acute finding or change from 2009. Electronically Signed   By: Marnee Spring M.D.   On: 04/12/2020 10:11   CT Abdomen Pelvis W Contrast  Result Date: May 17, 2020 CLINICAL DATA:  Abdominal pain. Nausea, vomiting, and weakness for 4 days. Patient is paraplegic. EXAM: CT ABDOMEN AND PELVIS WITH CONTRAST TECHNIQUE: Multidetector CT imaging of the abdomen and pelvis was performed using the standard protocol following bolus administration of intravenous contrast. CONTRAST:  34mL OMNIPAQUE IOHEXOL 300 MG/ML  SOLN COMPARISON:  None. FINDINGS: Lower chest: Opacity in the right base as seen on series 4, image 1 is consistent with an infectious or inflammatory process. Probable atelectasis in the left base. No other abnormalities in the lung bases. Hepatobiliary: There is a cyst or hemangioma in the right hepatic lobe. The liver is otherwise normal. Cholelithiasis is identified without obvious wall thickening. The portal vein is patent. Pancreas: Unremarkable. No pancreatic ductal dilatation or surrounding inflammatory changes. Spleen: Normal in size without focal abnormality. Adrenals/Urinary Tract: Adrenal glands are normal. Small bilateral renal cysts are noted. No suspicious masses. No hydronephrosis. There is a punctate stone in the right kidney on coronal image 49 measuring 2 mm. Visualized portions of the ureters are normal. No ureterectasis or ureteral stones. The patient has a urostomy which is  decompressed on this study and empties in the left lower quadrant. Stomach/Bowel: Stomach is unremarkable. The small bowel is dilated nearly along its entire length. There appear to be a few decompressed loops of small bowel in the right pelvis as seen on coronal image 30. There appears to be pneumatosis associated with some loops of small bowel in the left upper quadrant, well seen on coronal image 33. There is marked fecal loading in the rectum and distal sigmoid colon. The ascending colon and transverse colon are decompressed. The descending colon and proximal sigmoid  colon are not well visualized. The appendix is not visualized but there is no secondary evidence of appendicitis. 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 The abdominal aorta is nonaneurysmal without significant atherosclerotic change. No gas seen in the venous system despite the pneumatosis. No adenopathy. Reproductive: The patient has an IUD. There appears to be a dominant cyst in the region the right adnexa, simple in appearance measuring up to 2.7 cm. No abnormalities in the left adnexa. Other: There is some free fluid in the abdomen, likely secondary to the bowel obstruction. Musculoskeletal: No acute bony abnormalities identified. IMPRESSION: 1. High-grade small bowel obstruction. The transition point is likely in the right lower abdomen a right pelvis with some decompressed loops in this region. There is pneumatosis associated with left upper quadrant loops of small bowel. No free air on today's study to suggest perforation. 2. Marked fecal loading in the rectum and distal sigmoid colon. 3. Infiltrate in the right base worrisome for pneumonia. 4. Decompression of the ascending and transverse colon. Poor visualization of the descending colon and proximal sigmoid colon. 5. Left lower quadrant urostomy.  No renal abnormalities are noted. 6. Cholelithiasis. 7. Probable dominant cyst/follicle in the right ovary, simple in appearance  measuring 2.7 cm. No follow-up necessary. Findings called to Dr. Larinda Buttery. Electronically Signed   By: Gerome Sam III M.D   On: 04/23/2020 13:55   DG Abd Portable 1V  Result Date: 04/23/2020 CLINICAL DATA:  Small-bowel obstruction EXAM: PORTABLE ABDOMEN - 1 VIEW COMPARISON:  None. FINDINGS: Nasogastric tube is seen within the left upper quadrant of the abdomen overlying the gastric fundus. Contrast previously noted within the distal small bowel has now passed into the colon and rectum which is decompressed. Multiple gas-filled dilated loops of small bowel are still identified within the mid abdomen and, together, the findings are compatible with a partial small bowel obstruction. No other changes seen. Inferior vena cava filter, intrathecal pain pump, and intrauterine device noted. IMPRESSION: Partial small bowel obstruction with passage of contrast into a decompressed colon and rectum. Electronically Signed   By: Helyn Numbers MD   On: 04/23/2020 08:28   DG Abd Portable 1V-Small Bowel Obstruction Protocol-24 hr delay  Result Date: 04/21/2020 CLINICAL DATA:  Small bowel obstruction EXAM: PORTABLE ABDOMEN - 1 VIEW COMPARISON:  Yesterday FINDINGS: Definite improvement in small bowel distension, although still diffusely and abnormally dilated up to 3.4 cm. Some contrast has reached the rectum and been evacuated Enteric tube remains in good position. Stable pre-existing hardware. IMPRESSION: Improved small bowel obstruction with interval passage of contrast into colon. Electronically Signed   By: Marnee Spring M.D.   On: 04/21/2020 10:23   DG Abd Portable 1V-Small Bowel Obstruction Protocol-initial, 8 hr delay  Result Date: 04/20/2020 CLINICAL DATA:  Nausea, vomiting EXAM: PORTABLE ABDOMEN - 1 VIEW COMPARISON:  03/21/2020 FINDINGS: NG tube is in the stomach. Contrast material is seen within the stomach. Dilated small bowel loops are again noted in the abdomen and upper pelvis compatible with small  bowel obstruction. No significant change since prior study. No organomegaly. IVC filter in place. IMPRESSION: Continued dilated small bowel loops compatible with small bowel obstruction. No change since prior study. Contrast material seen within the stomach presumably injected through the NG tube. Electronically Signed   By: Charlett Nose M.D.   On: 04/20/2020 19:14   DG Abd Portable 1V  Result Date: 04/20/2020 CLINICAL DATA:  Small bowel obstruction EXAM: PORTABLE ABDOMEN - 1 VIEW COMPARISON:  04/19/2020 FINDINGS: Enteric  tube tip is in the left upper quadrant consistent with location in the upper stomach. Medication pump in the right lower quadrant. IVC filter. Surgical clips in the pelvis. Intrauterine device. Left lower quadrant ostomy. Gaseous distention of upper abdominal small bowel with decreased gas in the colon. Stool-filled rectum. Changes are consistent with small bowel obstruction. No significant improvement since prior study. Degenerative changes in the spine and hips. Vascular calcifications. IMPRESSION: Persistent gaseous distention of upper abdominal small bowel consistent with small bowel obstruction. Electronically Signed   By: Burman Nieves M.D.   On: 04/20/2020 05:11   DG Abd Portable 1 View  Result Date: 05/02/2020 CLINICAL DATA:  NG tube placement EXAM: PORTABLE ABDOMEN - 1 VIEW COMPARISON:  11/21/2010 FINDINGS: NG tube coils in the fundus of the stomach. IMPRESSION: NG tube in the stomach. Electronically Signed   By: Charlett Nose M.D.   On: 04/17/2020 15:51   [place] EKG SITE RITE  Result Date: 04/21/2020 If Site Rite image not attached, placement could not be confirmed due to current cardiac rhythm.  DG FL GUIDED LUMBAR PUNCTURE  Result Date: 04/16/2020 CLINICAL DATA:  Acute encephalopathy. EXAM: DIAGNOSTIC LUMBAR PUNCTURE UNDER FLUOROSCOPIC GUIDANCE FLUOROSCOPY TIME:  Fluoroscopy Time:  1 minutes 18 seconds Radiation Exposure Index (if provided by the fluoroscopic device):  14.2 mGy Number of Acquired Spot Images: 2 PROCEDURE: After discussing the risks and benefits of this procedure with the patient's son informed consent was obtained. The patient was evaluated under fluoroscopy. The large ulceration and adjacent inflammatory tissue on the patient's lower back and sacrum is over the site that we would use for lumbar puncture making access not possible. In addition the patient's pain pump in the anterior abdomen is overlying the spine making evaluation of an access site difficult. Lumbar puncture not performed. IMPRESSION: Lumbar puncture not performed as discussed above. Electronically Signed   By: Maisie Fus  Register   On: 05/02/2020 16:44   Assessment/Plan Montanna Mcbain is a 54 year old female with medical history significant for COPD, quadriplegia status post spinal cord injury and cystectomy with ileal conduit diversion with possible erosion of IVC filter into the duodenum  1.  Possible IVC filter erosion: Patient with IVC filter for approximately 9 years. Most recent CT of the abdomen and pelvis notable for possible erosion of one of the struts into the duodenum.  With invasion into the bowel recommend removal.  Procedure, risks and benefits were explained to the patient.  All questions were answered.  Patient was to proceed.  2.  Small bowel obstruction: Clinically improving Followed by general surgery  3.  Severe malnutrition: Patient will undergo PEG tube placement in the near future  Discussed with Dr. Romie Jumper, PA-C  05/10/2020 1:57 PM  This note was created with Dragon medical transcription system.  Any error is purely unintentional

## 2020-04-27 NOTE — Clinical Social Work Note (Signed)
Per MD in yesterday's progression meeting, patient had mentioned rehab while they were discussing her care. CSW following for PT/OT recommendations. Per IR note, plan for PEG placement on Friday. CSW sent referral information to [facility] representative and notified [facility] representative just in case patient ends up going home at discharge.  Charlynn Court, CSW [phone]

## 2020-04-27 NOTE — Progress Notes (Addendum)
   04/26/2020 0745  Assess: MEWS Score  Temp 99.1 F (37.3 C)  BP 95/62  Pulse Rate (!) 109  Resp 20  Level of Consciousness Alert  SpO2 97 %  O2 Device Room Air  Assess: MEWS Score  MEWS Temp 0  MEWS Systolic 1  MEWS Pulse 1  MEWS RR 0  MEWS LOC 0  MEWS Score 2  MEWS Score Color Yellow  Assess: if the MEWS score is Yellow or Red  Were vital signs taken at a resting state? Yes  Focused Assessment No change from prior assessment  Early Detection of Sepsis Score *See Row Information* Low  MEWS guidelines implemented *See Row Information* No, previously yellow, continue vital signs every 4 hours  Treat  Pain Scale 0-10  Pain Score 0  Pt previous yellow from last shift. No changes noted in assessment. Will continue to monitor.

## 2020-04-27 NOTE — Consult Note (Signed)
PHARMACY CONSULT NOTE - FOLLOW UP  Pharmacy Consult for Electrolyte Monitoring and Replacement   Recent Labs: Potassium (mmol/L)  Date Value  04/26/2020 4.2   Magnesium (mg/dL)  Date Value  28/36/6294 1.7   Calcium (mg/dL)  Date Value  76/54/6503 7.7 (L)   Albumin (g/dL)  Date Value  54/65/6812 2.2 (L)   Phosphorus (mg/dL)  Date Value  75/17/0017 2.9   Sodium (mmol/L)  Date Value  04/26/2020 145     Assessment: Pharmacy has been consulted to electrolyte management in a 54 y.o.femalewith medical history significant forCOPD, quadriplegia status post spinal cord injury and cystectomy with ileal conduit diversion who presents to the ER withSBO. Per notes, patient is at goal rate of enteral feeds (40 mL/hr).   Corrected Calcium 8.94  Mg 1.7-hypomagnesemia (slightly)   Goal of Therapy:  Electrolytes WNL  Plan:  1. Will order Mg IV 2g x1.  2. Will follow- up with AM labs tomorrow.   Katha Cabal ,PharmD Clinical Pharmacist 04/23/2020 7:27 AM

## 2020-04-27 NOTE — Progress Notes (Addendum)
[facility]  - [place] [facility]   PATIENT NAME: [name]    MR#:  [id number]  DATE OF BIRTH:  22-Nov-1965  SUBJECTIVE:  some loose stools after starting tube feeding. Not complaining of abdominal pain. She is NPO for vascular procedure and attempt to remove IVC filter.  REVIEW OF SYSTEMS:   Review of Systems  Constitutional: Negative for chills, fever and weight loss.  HENT: Negative for ear discharge, ear pain and nosebleeds.   Eyes: Negative for blurred vision, pain and discharge.  Respiratory: Negative for sputum production, shortness of breath, wheezing and stridor.   Cardiovascular: Negative for chest pain, palpitations, orthopnea and PND.  Gastrointestinal: Negative for abdominal pain, diarrhea, nausea and vomiting.  Genitourinary: Negative for frequency and urgency.  Musculoskeletal: Negative for back pain and joint pain.  Neurological: Positive for weakness. Negative for sensory change, speech change and focal weakness.  Psychiatric/Behavioral: Negative for depression and hallucinations. The patient is not nervous/anxious.    Tolerating Diet:NPO today Tolerating PT: bedbound due to functional quadriplegia  DRUG ALLERGIES:   Allergies  Allergen Reactions  . Shellfish Allergy Shortness Of Breath    VITALS:  Blood pressure 95/61, pulse (!) 118, temperature 99.2 F (37.3 C), temperature source Oral, resp. rate 20, height 5\' 1"  (1.549 m), weight 37.1 kg, SpO2 97 %.  PHYSICAL EXAMINATION:   Physical Exam   GENERAL:  54 y.o.-year-old patient lying in the bed with no acute distress. Thin cachectic severely malnourished HEENT: NG+ old trach sit well healed \\LUNGS : Normal breath sounds bilaterally, no wheezing, rales, rhonchi. No use of accessory muscles of respiration. No respiratory distress CARDIOVASCULAR: S1, S2 normal. No murmurs, rubs, or gallops.  ABDOMEN: Soft, nontender, nondistended. urostomy bag +  EXTREMITIES: thin atrophied with  contractors PICC + NEUROLOGIC: functional quadriplegia with significant contracture both upper and lower extremity PSYCHIATRIC:  patient is alert oriented times three SKIN:   Pressure Injury 04/08/2020 Sacrum Stage 3 -  Full thickness tissue loss. Subcutaneous fat may be visible but bone, tendon or muscle are NOT exposed. (Active)  04/13/2020 1433  Location: Sacrum  Location Orientation:   Staging: Stage 3 -  Full thickness tissue loss. Subcutaneous fat may be visible but bone, tendon or muscle are NOT exposed.  Wound Description (Comments):   Present on Admission: Yes     Pressure Injury 04/09/2020 Back Lateral;Left;Upper Stage 2 -  Partial thickness loss of dermis presenting as a shallow open injury with a red, pink wound bed without slough. 1.75cm x 2cm (Active)  05/01/2020 1923  Location: Back  Location Orientation: Lateral;Left;Upper  Staging: Stage 2 -  Partial thickness loss of dermis presenting as a shallow open injury with a red, pink wound bed without slough.  Wound Description (Comments): 1.75cm x 2cm  Present on Admission: Yes     Pressure Injury 04/11/2020 Heel Left;Right  (Active)  05/02/2020 2000  Location: Heel  Location Orientation: Left;Right  Staging: -- (recently healed pressure injury)  Wound Description (Comments):   Present on Admission: Yes   LABORATORY PANEL:  CBC Recent Labs  Lab 04/26/20 0543  WBC 13.2*  HGB 11.0*  HCT 35.1*  PLT 317    Chemistries  Recent Labs  Lab 04/26/20 0543 04/26/20 0543 05/27/20 0737  NA 145   < > 142  K 4.2   < > 3.9  CL 110   < > 108  CO2 29   < > 27  GLUCOSE 170*   < > 96  BUN  16   < > 16  CREATININE <0.30*   < > <0.30*  CALCIUM 7.7*   < > 7.5*  MG 1.7   < > 1.7  AST 11*  --   --   ALT 6  --   --   ALKPHOS 42  --   --   BILITOT 0.2*  --   --    < > = values in this interval not displayed.   Cardiac Enzymes No results for input(s): TROPONINI in the last 168 hours. RADIOLOGY:  CT ABDOMEN PELVIS WO  CONTRAST  Result Date: 04/26/2020 CLINICAL DATA:  COPD, quadriplegia and cystectomy with ileal conduit diversion. Nausea, vomiting and diffuse abdominal pain. EXAM: CT ABDOMEN AND PELVIS WITHOUT CONTRAST TECHNIQUE: Multidetector CT imaging of the abdomen and pelvis was performed following the standard protocol without IV contrast. COMPARISON:  Prior CT abdomen/pelvis 04/11/2020 FINDINGS: Lower chest: Small bilateral pleural effusions with associated atelectasis. Persistent but improving patchy airspace opacity in the periphery of the posterior right upper lobe. Interval clearing of airspace disease from the right lower lobe. Hepatobiliary: Normal hepatic contour and morphology. Small low-attenuation lesion in the posterior right hepatic lobe previously characterized as a cyst. Cholelithiasis again noted. The gallstones are hyperattenuating. No biliary ductal dilatation. Pancreas: Unremarkable. No pancreatic ductal dilatation or surrounding inflammatory changes. Spleen: Normal in size without focal abnormality. Adrenals/Urinary Tract: Surgical changes of prior cystectomy with left lower quadrant diverting ileostomy. Limited evaluation of the kidneys in the absence of intravenous contrast. Perhaps mild right-sided hydronephrosis versus fullness of the collecting system. Punctate parenchymal calcifications versus nonobstructing stones in the right renal parenchyma. Visualized ureters are unremarkable. Stomach/Bowel: Gastric tube present within the stomach. Interval resolution of small bowel obstruction. However, there has been development of small volume ascites which is new. Vascular/Lymphatic: Limited evaluation in the absence of intravenous contrast. IVC filter is present with penetration of the struts, 1 of the right-sided struts may penetrate into the duodenum. Reproductive: IUD in place. Similar appearance of a 2.8 cm low-attenuation lesion affiliated with the right adnexa. The left adnexa is unremarkable.  Other: Small volume ascites throughout the abdomen and pelvis. Musculoskeletal: No acute or significant osseous findings. IMPRESSION: 1. While there has been significant improvement in the previously noted small bowel obstruction, there has been interval development of small volume ascites of uncertain etiology. 2. Stable appearance of IVC filter with multifocal strut penetration including 1 strut which may enter the duodenal lumen. This could represent a source for the patient's abdominal pain. Consider referral to Interventional Radiology for evaluation of possible filter retrieval. 3. Small bilateral pleural effusions with associated atelectasis. 4. Improved right lower lobe airspace opacity with new patchy airspace disease in the posterior and lateral right upper lobe. Suspect small volume aspiration. 5. Gastric tube in place. 6. IUD in place. 7. Additional ancillary findings as above. Electronically Signed   By: Malachy Moan M.D.   On: 04/26/2020 16:09   DG Abd 1 View  Result Date: 04/25/2020 CLINICAL DATA:  Abdominal pain, vomiting and weakness. EXAM: ABDOMEN - 1 VIEW COMPARISON:  04/24/2020 radiograph and prior studies FINDINGS: An NG tube is noted within the proximal-mid stomach. Mild gastric distention is noted. Gas within the colon is noted. No dilated small bowel loops are present. IVC filter, spinal cord stimulator and IUD again noted. No other changes noted. IMPRESSION: NG tube within the proximal-mid stomach with mild gastric distention. Electronically Signed   By: Harmon Pier M.D.   On: 04/25/2020 18:40  ASSESSMENT AND PLAN:   [name] MastersCharlotte M [name] is a 54 y.o. 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  She also complains of diffuse abdominal pain.  Acute metabolic encephalopathy suspected due to severe  hypernatremia/hypokalemia/hyperchloremia/hypomagnesimia/hypophosphatemia Suspected bacterial meningitis Mentation back to normal. -came in with serum sodium 136-- 157--159--dextrose drip-- 151--148--145--140 -patient is more awake and alert. Mentation back to baseline. -CT head no acute finding -neurology consultation with Dr. Otelia LimesLindzen-- recommends empiric antibiotic for bacterial meningitis for total 14 days.  -IV ampicillin and Rocephin per pharmacy x 14 days  Small bowel obstruction -improving clinically -overall x-ray seems to be improving per Dr. Aleen CampiPiscoya. Holding of surgery at present -appreciate surgical input.  --CT abdomen  shows high-grade small bowel obstruction as well as fecal impaction -Supportive care with pain control, antiemetics, IV PPI and IV fluid hydration -10/23-- discussed with Dr. Lady Garyannon-- start NG tube feeding, wean TPN off over the weekend -speech therapy recommends clear liquid. -10/25-- x-ray KUB overall looks better. Some gaseous distention. Patient having bowel movement.  Nutrition Status: severe malnutrition with failure to thrive, poor PO intake Refeeding syndrome with electrolyte abnormality Nutrition Problem: Severe Malnutrition Etiology: chronic illness (COPD, quadriplegia) Signs/Symptoms: percent weight loss, severe fat depletion, severe muscle depletion Percent weight loss: 14 % Interventions: TPN  -patient will benefit from peg tube placement. Surgery has discussed with patient's son who is in agreement.-- Plans on hold at present given altered mental status - 04/21/20--started IV TPN -04/24/2020-- started NG tube feeding, wean TPN, started clear liquid, patient refusing peg tube placement -10/25-- discussed again with patient regarding peg tube placement she is in agreement. Discussed with son Everlean AlstromMaurice about the decision he is aware of it. -G.I. consult placed for Dr. Mia CreekLocklear for peg tube placement--deferred it to IR -TPN to be stopped today. NG  feeding at goal rate -10/26--CT abdomen --showed old IVC filter strut with possible risk of duodenal penetration. 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 Patient has multiple comorbidities. Appreciate input  DVT prophylaxis: Lovenox Code Status: Full code Family Communication: spoke with son Erlinda HongMaurice Spainhour on the phone today 10/26 Disposition Plan:  to be determined-- TOC consult placed Consults called: Surgery, palliative care, Neurology, vascular, IR  Status is: Inpatient  Patient continues to improve slowly. She still has NG tube feeding.IR to placepeg tube placement on oct 29th Currently on empiric IV antibiotics for possible bacterial meningitis. Vascular surgery to attempt removal of IVC filter today TOC for discharge planning to  rehab PT/OT to see pt  Dispo: The patient is from: Home              Anticipated d/c is to: To be determined               Anticipated d/c date is: early next week if rehab bed available and clinically pt stable              Patient currently is not medically stable to d/c.  TOTAL TIME TAKING CARE OF THIS PATIENT: 25 minutes.  >50% time spent on counselling and coordination of care  Note: This dictation was prepared with Dragon dictation along with smaller phrase technology. Any transcriptional errors that result from this process are unintentional.  Enedina Finner M.D    [facility]   CC: Primary care physician; [place], Julie A, MDPatient ID: [name], female   DOB: 09-14-1965, 54 y.o.   MRN: [id number]

## 2020-04-28 ENCOUNTER — Encounter: Payer: Self-pay | Admitting: Vascular Surgery

## 2020-04-28 DIAGNOSIS — K56609 Unspecified intestinal obstruction, unspecified as to partial versus complete obstruction: Secondary | ICD-10-CM | POA: Diagnosis not present

## 2020-04-28 DIAGNOSIS — R140 Abdominal distension (gaseous): Secondary | ICD-10-CM | POA: Diagnosis not present

## 2020-04-28 DIAGNOSIS — E46 Unspecified protein-calorie malnutrition: Secondary | ICD-10-CM | POA: Diagnosis not present

## 2020-04-28 LAB — BASIC METABOLIC PANEL
Anion gap: 7 (ref 5–15)
BUN: 12 mg/dL (ref 6–20)
CO2: 26 mmol/L (ref 22–32)
Calcium: 7.8 mg/dL — ABNORMAL LOW (ref 8.9–10.3)
Chloride: 111 mmol/L (ref 98–111)
Creatinine, Ser: <0.3 mg/dL — ABNORMAL LOW (ref 0.44–1.00)
Glucose, Bld: 209 mg/dL — ABNORMAL HIGH (ref 70–99)
Potassium: 3.5 mmol/L (ref 3.5–5.1)
Sodium: 144 mmol/L (ref 135–145)

## 2020-04-28 LAB — GLUCOSE, CAPILLARY
Glucose-Capillary: 104 mg/dL — ABNORMAL HIGH (ref 70–99)
Glucose-Capillary: 114 mg/dL — ABNORMAL HIGH (ref 70–99)
Glucose-Capillary: 152 mg/dL — ABNORMAL HIGH (ref 70–99)
Glucose-Capillary: 163 mg/dL — ABNORMAL HIGH (ref 70–99)
Glucose-Capillary: 166 mg/dL — ABNORMAL HIGH (ref 70–99)
Glucose-Capillary: 98 mg/dL (ref 70–99)

## 2020-04-28 LAB — MAGNESIUM: Magnesium: 1.8 mg/dL (ref 1.7–2.4)

## 2020-04-28 LAB — PHOSPHORUS: Phosphorus: 2.5 mg/dL (ref 2.5–4.6)

## 2020-04-28 MED ORDER — ACETAMINOPHEN 325 MG PO TABS: 650.0000 mg | ORAL_TABLET | Freq: Four times a day (QID) | ORAL | Status: DC | PRN

## 2020-04-28 MED ORDER — JUVEN PO PACK
1.0000 | PACK | Freq: Two times a day (BID) | ORAL | Status: DC
  Administered 2020-04-28: 1

## 2020-04-28 MED ORDER — DOPAMINE-DEXTROSE 3.2-5 MG/ML-% IV SOLN
0.0000 ug/kg/min | INTRAVENOUS | Status: DC
  Filled 2020-04-28: qty 250

## 2020-04-28 MED ORDER — FREE WATER
30.0000 mL | Status: DC
Start: 2020-04-28 — End: 2020-04-28

## 2020-04-28 MED ORDER — OSMOLITE 1.2 CAL PO LIQD
1000.0000 mL | ORAL | Status: DC
  Administered 2020-04-28: 1000 mL

## 2020-04-28 MED ORDER — FREE WATER
30.0000 mL | Status: DC
  Administered 2020-04-28 (×4): 30 mL

## 2020-04-28 MED ORDER — POTASSIUM CHLORIDE 10 MEQ/100ML IV SOLN
10.0000 meq | INTRAVENOUS | Status: AC
  Administered 2020-04-28 (×2): 10 meq via INTRAVENOUS
  Filled 2020-04-28 (×2): qty 100

## 2020-04-28 MED ORDER — POTASSIUM CHLORIDE 20 MEQ PO PACK: 20.0000 meq | PACK | Freq: Once | ORAL | Status: DC

## 2020-04-28 MED ORDER — MAGNESIUM SULFATE 2 GM/50ML IV SOLN
2.0000 g | Freq: Once | INTRAVENOUS | Status: AC
  Administered 2020-04-28: 2 g via INTRAVENOUS
  Filled 2020-04-28: qty 50

## 2020-04-28 NOTE — Progress Notes (Signed)
PROGRESS NOTE    [name] MastersCharlotte M [name]  ZOX:[id number] DOB: 1966/05/28 DOA: 04/30/2020 PCP: [place], Julie A, MD    Brief Narrative:  [name] Guharlotte M Wadeis a 54 y.o.femalewith medical history significant forCOPD, quadriplegia status post spinal 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 She also complains of diffuse abdominal pain.  Found with small bowel obstruction  10/27-NGT in place.   Consultants:   Surgery, neurology  Procedures:   Antimicrobials:       Subjective: Feeling a little better. No c/o pain this am.    Objective: Vitals:   04/23/2020 1955 04/04/2020 2342 04/28/20 0606 04/28/20 0742  BP: 97/64 91/60 108/69 111/63  Pulse: (!) 125 (!) 103 (!) 101 (!) 107  Resp: 16 16 16 20   Temp: 98 F (36.7 C) 97.9 F (36.6 C) 97.9 F (36.6 C) 98.6 F (37 C)  TempSrc: Oral Oral Oral Oral  SpO2: 98% 97% 100% 96%  Weight:      Height:        Intake/Output Summary (Last 24 hours) at 04/28/2020 0901 Last data filed at 04/28/2020 0557 Gross per 24 hour  Intake 2854.91 ml  Output 1600 ml  Net 1254.91 ml   Filed Weights   04/26/20 0421 04/02/2020 0500 04/25/2020 1443  Weight: 35.1 kg 37.1 kg 37.1 kg    Examination:  Calm and comfortable NG tube in place RRR S1-S2 no murmurs Soft mildly distended positive bowel sounds No edema Mood and affect appropriate for current setting    Data Reviewed: I have personally reviewed following labs and imaging studies  CBC: Recent Labs  Lab 04/04/2020 0542 04/23/20 1222 04/26/20 0543  WBC 15.8* 11.6* 13.2*  NEUTROABS 11.9*  --  11.1*  HGB 9.4* 11.0* 11.0*  HCT 28.9* 34.2* 35.1*  MCV 87.3 86.8 89.3  PLT 465* 402* 317   Basic Metabolic Panel: Recent Labs  Lab 04/23/20 0100 04/23/20 0100 04/23/20 1401 04/24/20 0500 04/25/20 0910 04/26/20 0543 04/10/2020 0737 04/28/20 0607  NA 146*   < > 139 145  --  145 142 144  K 2.7*   < > 3.6  3.4*  --  4.2 3.9 3.5  CL 111   < > 104 107  --  110 108 111  CO2 26   < > 27 32  --  29 27 26   GLUCOSE 128*   < > 118* 112*  --  170* 96 209*  BUN 8   < > 6 7  --  16 16 12   CREATININE <0.30*   < > <0.30* <0.30*  --  <0.30* <0.30* <0.30*  CALCIUM 7.1*   < > 7.2* 7.5*  --  7.7* 7.5* 7.8*  MG 1.6*  --   --  1.9  --  1.7 1.7 1.8  PHOS 2.0*   < >  --  2.1* 3.6 2.9 3.0 2.5   < > = values in this interval not displayed.   GFR: CrCl cannot be calculated (This lab value cannot be used to calculate CrCl because it is not a number: <0.30). Liver Function Tests: Recent Labs  Lab 04/24/2020 0542 04/26/20 0543  AST 13* 11*  ALT 6 6  ALKPHOS 61 42  BILITOT 0.5 0.2*  PROT 5.9* 5.6*  ALBUMIN 2.4* 2.2*   No results for input(s): LIPASE, AMYLASE in the last 168 hours. Recent Labs  Lab 04/21/20 1138  AMMONIA 40*   Coagulation Profile: No  results for input(s): INR, PROTIME in the last 168 hours. Cardiac Enzymes: No results for input(s): CKTOTAL, CKMB, CKMBINDEX, TROPONINI in the last 168 hours. BNP (last 3 results) No results for input(s): PROBNP in the last 8760 hours. HbA1C: No results for input(s): HGBA1C in the last 72 hours. CBG: Recent Labs  Lab 2020/05/27 1745 2020-05-27 1953 05-27-2020 2345 04/28/20 0327 04/28/20 0738  GLUCAP 82 151* 196* 166* 163*   Lipid Profile: Recent Labs    04/26/20 0543  TRIG 36   Thyroid Function Tests: No results for input(s): TSH, T4TOTAL, FREET4, T3FREE, THYROIDAB in the last 72 hours. Anemia Panel: No results for input(s): VITAMINB12, FOLATE, FERRITIN, TIBC, IRON, RETICCTPCT in the last 72 hours. Sepsis Labs: Recent Labs  Lab 04/21/20 1138  LATICACIDVEN 0.9    Recent Results (from the past 240 hour(s))  Urine culture     Status: Abnormal   Collection Time: 04/24/2020 10:19 AM   Specimen: Urine, Random  Result Value Ref Range Status   Specimen Description   Final    URINE, RANDOM Performed at [facility], [address]  [place], [place] [address]    Special Requests   Final    NONE Performed at [facility], [address] [place], [place] [address]    Culture MULTIPLE SPECIES PRESENT, SUGGEST RECOLLECTION (A)  Final   Report Status 04/19/2020 FINAL  Final  Respiratory Panel by RT PCR (Flu A&B, Covid) - Nasopharyngeal Swab     Status: None   Collection Time: 04/17/2020  1:57 PM   Specimen: Nasopharyngeal Swab  Result Value Ref Range Status   SARS Coronavirus 2 by RT PCR NEGATIVE NEGATIVE Final    Comment: (NOTE) SARS-CoV-2 target nucleic acids are NOT DETECTED.  The SARS-CoV-2 RNA is generally detectable in upper respiratoy specimens during the acute phase of infection. The lowest concentration of SARS-CoV-2 viral copies this assay can detect is 131 copies/mL. A negative result does not preclude SARS-Cov-2 infection and should not be used as the sole basis for treatment or other patient management decisions. A negative result may occur with  improper specimen collection/handling, submission of specimen other than nasopharyngeal swab, presence of viral mutation(s) within the areas targeted by this assay, and inadequate number of viral copies (<131 copies/mL). A negative result must be combined with clinical observations, patient history, and epidemiological information. The expected result is Negative.  Fact Sheet for Patients:  https://www.moore.com/  Fact Sheet for Healthcare Providers:  https://www.young.biz/  This test is no t yet approved or cleared by the [place] FDA and  has been authorized for detection and/or diagnosis of SARS-CoV-2 by FDA under an Emergency Use Authorization (EUA). This EUA will remain  in effect (meaning this test can be used) for the duration of the COVID-19 declaration under Section 564(b)(1) of the Act, 21 U.S.C. section 360bbb-3(b)(1), unless the authorization is terminated or revoked sooner.     Influenza A by  PCR NEGATIVE NEGATIVE Final   Influenza B by PCR NEGATIVE NEGATIVE Final    Comment: (NOTE) The Xpert Xpress SARS-CoV-2/FLU/RSV assay is intended as an aid in  the diagnosis of influenza from Nasopharyngeal swab specimens and  should not be used as a sole basis for treatment. Nasal washings and  aspirates are unacceptable for Xpert Xpress SARS-CoV-2/FLU/RSV  testing.  Fact Sheet for Patients: https://www.moore.com/  Fact Sheet for Healthcare Providers: https://www.young.biz/  This test is not yet approved or cleared by the [place] and  has been authorized for  detection and/or diagnosis of SARS-CoV-2 by  FDA under an Emergency Use Authorization (EUA). This EUA will remain  in effect (meaning this test can be used) for the duration of the  Covid-19 declaration under Section 564(b)(1) of the Act, 21  U.S.C. section 360bbb-3(b)(1), unless the authorization is  terminated or revoked. Performed at [facility], [address], [place], [place] [address]          Radiology Studies: CT ABDOMEN PELVIS WO CONTRAST  Result Date: 04/26/2020 CLINICAL DATA:  COPD, quadriplegia and cystectomy with ileal conduit diversion. Nausea, vomiting and diffuse abdominal pain. EXAM: CT ABDOMEN AND PELVIS WITHOUT CONTRAST TECHNIQUE: Multidetector CT imaging of the abdomen and pelvis was performed following the standard protocol without IV contrast. COMPARISON:  Prior CT abdomen/pelvis 04/17/2020 FINDINGS: Lower chest: Small bilateral pleural effusions with associated atelectasis. Persistent but improving patchy airspace opacity in the periphery of the posterior right upper lobe. Interval clearing of airspace disease from the right lower lobe. Hepatobiliary: Normal hepatic contour and morphology. Small low-attenuation lesion in the posterior right hepatic lobe previously characterized as a cyst. Cholelithiasis again noted. The gallstones are  hyperattenuating. No biliary ductal dilatation. Pancreas: Unremarkable. No pancreatic ductal dilatation or surrounding inflammatory changes. Spleen: Normal in size without focal abnormality. Adrenals/Urinary Tract: Surgical changes of prior cystectomy with left lower quadrant diverting ileostomy. Limited evaluation of the kidneys in the absence of intravenous contrast. Perhaps mild right-sided hydronephrosis versus fullness of the collecting system. Punctate parenchymal calcifications versus nonobstructing stones in the right renal parenchyma. Visualized ureters are unremarkable. Stomach/Bowel: Gastric tube present within the stomach. Interval resolution of small bowel obstruction. However, there has been development of small volume ascites which is new. Vascular/Lymphatic: Limited evaluation in the absence of intravenous contrast. IVC filter is present with penetration of the struts, 1 of the right-sided struts may penetrate into the duodenum. Reproductive: IUD in place. Similar appearance of a 2.8 cm low-attenuation lesion affiliated with the right adnexa. The left adnexa is unremarkable. Other: Small volume ascites throughout the abdomen and pelvis. Musculoskeletal: No acute or significant osseous findings. IMPRESSION: 1. While there has been significant improvement in the previously noted small bowel obstruction, there has been interval development of small volume ascites of uncertain etiology. 2. Stable appearance of IVC filter with multifocal strut penetration including 1 strut which may enter the duodenal lumen. This could represent a source for the patient's abdominal pain. Consider referral to Interventional Radiology for evaluation of possible filter retrieval. 3. Small bilateral pleural effusions with associated atelectasis. 4. Improved right lower lobe airspace opacity with new patchy airspace disease in the posterior and lateral right upper lobe. Suspect small volume aspiration. 5. Gastric tube in  place. 6. IUD in place. 7. Additional ancillary findings as above. Electronically Signed   By: Malachy Moan M.D.   On: 04/26/2020 16:09   PERIPHERAL VASCULAR CATHETERIZATION  Result Date: 04/26/2020 See Op Note       Scheduled Meds: . Chlorhexidine Gluconate Cloth  6 each Topical Daily  . enoxaparin (LOVENOX) injection  40 mg Subcutaneous Q24H  . feeding supplement  1 Container Oral TID BM  . fluticasone  2 spray Each Nare Daily  . fluticasone  2 puff Inhalation BID  . influenza vac split quadrivalent PF  0.5 mL Intramuscular Tomorrow-1000  . mirtazapine  30 mg Oral QHS  . multivitamin  15 mL Per Tube Daily  . pantoprazole sodium  20 mg Per Tube Daily  . tiotropium  18 mcg Inhalation Daily  Continuous Infusions: . sodium chloride Stopped (04/11/2020 0057)  . sodium chloride 50 mL/hr at 04/12/2020 2223  . ampicillin (OMNIPEN) IV 2 g (04/28/20 0612)  . cefTRIAXone (ROCEPHIN)  IV 2 g (04/09/2020 2226)  . feeding supplement (OSMOLITE 1.5 CAL) 40 mL/hr at 04/28/20 0557  . magnesium sulfate bolus IVPB    . potassium chloride      Assessment & Plan:   Principal Problem:   SBO (small bowel obstruction) (HCC) Active Problems:   COPD (chronic obstructive pulmonary disease) (HCC)   History of urostomy   Paraplegic spinal paralysis (HCC)   Urinary tract infection without hematuria   Sacral decubitus ulcer, stage III (HCC)   Pressure ulcer of back   Hypokalemia   Leukocytosis   Protein-calorie malnutrition, severe   Goals of care, counseling/discussion   Palliative care by specialist   Electrolyte abnormality   Acute encephalopathy   [name] a 54 y.o.femalewith medical history significant forCOPD, quadriplegia status post spinal 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 She also complains of diffuse abdominal pain.  Acute metabolic encephalopathy suspected due to severe  hypernatremia/hypokalemia/hyperchloremia/hypomagnesimia/hypophosphatemia Suspected bacterial meningitis -came in with serum sodium 136-- 157-- Now mentating normal Ct head no acute finding Unable to do LP secondary to disrupted anatomy of the back and skin breakdown EEG -continuous generalized slowing was consistent with encephalopathy.  There were periodic epileptiform discharges with triphasic morphology which at times appear rhythmic with some evolution, falling along the ictalinterictal spectrum.  No definite seizures were seen Per neurology continue meningitis antibiotic treatment for possible meningitis for total of 14 days We will continue IV ampicillin and Rocephin Minimize sedating and pain meds Neurology signed off    Small bowel obstruction -CT of the abdomen found with high-grade small bowel obstruction  clinically improving General surgery was consulted  Clinically improving  Holding off surgery at present  Continue NG tube feeding  Continue IV fluid hydrationon  Patient having bowel movements  Nutrition Status: severe malnutrition with failure to thrive, poor PO intake Refeeding syndrome with electrolyte abnormality -At Severe Malnutrition-2/2 chronic illness (COPD, quadriplegia) -On TPN Started on NGT feeding with weaning of TPN.  On clears and tolerating Pt refused PEG placement, now plan for palcement on friday RD following-see note General aspiration precautions CT abd- showed old IVC filter strut with possible risk of duodenal penetration. S/p attempt for removal of IVC which retrieval was unsuccessful.  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 Continue supportive care  COPD Without acute exacerbation  Continue inhaled steroids and Spiriva     Chronic Decubitus ulcers(POA) -Patient has a stage III pressure injury to sacrum, stage II pressure injury in the back of shoulder and stage II pressure injury of the back of her thigh With wound care recommendations Turn patient frequently to prevent development of further pressure ulcers    DVT prophylaxis: Lovenox Code Status: Full Family Communication: None at bedside  Status is: Inpatient  Remains inpatient appropriate because:IV treatments appropriate due to intensity of illness or inability to take PO   Dispo: The patient is from: Home  Anticipated d/c is to: TBD              Anticipated d/c date is: > 3 days              Patient currently is not medically stable to d/c. Needs Peg placement on 29th.             LOS: 10 days   Time spent: 35 minutes with more than 50% on COC    Lynn Ito, MD [facility] Pager [phone]  If 7PM-7AM, please contact night-coverage [email or web address] Password [email or web address] 04/28/2020, 9:01 AM

## 2020-04-28 NOTE — Progress Notes (Addendum)
SLP Cancellation Note  Patient Details Name: [name] MRN: [id number] DOB: 08/23/1965   Cancelled treatment:       Reason Eval/Treat Not Completed:  (chart reviewed; consulted pt in room). Pt stated she was tolerating her clear liquid diet w/ no reports of overt s/s of aspiration. No reports by NSG, chart. Pt is awaiting placement of PEG on Friday for nutritional support/FTT, wounds. NG remains in place currently w/ Clear Liquids diet.  As pt does not present w/ oropharyngeal phase dysphagia, her diet can be upgraded to a Regular consistency post PEG placement, per MD; general aspiration precautions. ST services can be reconsulted if new swallowing needs arise. MD/NSG and Dietician updated. General aspiration precautions posted in room.     Jerilynn Som, MS, CCC-SLP Speech Language Pathologist Rehab Services [phone] [facility] 04/28/2020, 12:28 PM

## 2020-04-28 NOTE — Care Management Important Message (Signed)
Important Message  Patient Details  Name: [name] MRN: [id number] Date of Birth: [birth year]   Medicare Important Message Given:  Yes     Johnell Comings 04/28/2020, 11:03 AM

## 2020-04-28 NOTE — NC FL2 (Signed)
[place] MEDICAID FL2 LEVEL OF CARE SCREENING TOOL     IDENTIFICATION  Patient Name: [name] Birthdate: 1966-05-17 Sex: female Admission Date (Current Location): 04/16/2020  [place] and [place] Number:  Chiropodist and Address:  [facility], [address], [place], [place] [address]      Provider Number: [id number]  Attending Physician Name and Address:  Lynn Ito, MD  Relative Name and Phone Number:       Current Level of Care: Hospital Recommended Level of Care: Skilled Nursing Facility Prior Approval Number:    Date Approved/Denied:   PASRR Number: [id number]  Discharge Plan: SNF    Current Diagnoses: Patient Active Problem List   Diagnosis Date Noted  . Acute encephalopathy   . Electrolyte abnormality   . Protein-calorie malnutrition, severe 04/21/2020  . Goals of care, counseling/discussion   . Palliative care by specialist   . Hypokalemia   . Leukocytosis   . SBO (small bowel obstruction) (HCC) 04/07/2020  . Sacral decubitus ulcer, stage III (HCC) 04/17/2020  . Pressure ulcer of back 04/19/2020  . COPD (chronic obstructive pulmonary disease) (HCC)   . History of urostomy   . Paraplegic spinal paralysis (HCC)   . Urinary tract infection without hematuria   . Pneumonia 09/07/2016  . Pressure injury of skin 09/07/2016    Orientation RESPIRATION BLADDER Height & Weight     Self, Time, Situation, Place  Normal Urostomy Weight: 81 lb 12.7 oz (37.1 kg) Height:  5\' 3"  (160 cm)  BEHAVIORAL SYMPTOMS/MOOD NEUROLOGICAL BOWEL NUTRITION STATUS   (None)  (None) Incontinent Feeding tube, Diet (Clear liquids at this time. Also has an NG tube but will have removed and PEG tube will be placed on Friday 10/29)  AMBULATORY STATUS COMMUNICATION OF NEEDS Skin   Total Care Verbally PU Stage and Appropriate Care, Other (Comment) (Pressure injury on both heels: Foam every 3 days.)   PU Stage 2 Dressing:  (Left lateral upper  back: foam every 3 days.) PU Stage 3 Dressing: Daily (Sacrum: Foam.)                 Personal Care Assistance Level of Assistance  Bathing, Feeding, Dressing Bathing Assistance: Maximum assistance Feeding assistance: Maximum assistance Dressing Assistance: Maximum assistance     Functional Limitations Info  Sight, Hearing, Speech Sight Info: Adequate Hearing Info: Adequate Speech Info: Adequate    SPECIAL CARE FACTORS FREQUENCY  OT (By licensed OT), PT (By licensed PT)     PT Frequency: 5 x week OT Frequency: 5 x week            Contractures Contractures Info: Not present    Additional Factors Info  Code Status, Allergies Code Status Info: Full code Allergies Info: Shellfish           Current Medications (04/28/2020):  This is the current hospital active medication list Current Facility-Administered Medications  Medication Dose Route Frequency Provider Last Rate Last Admin  . 0.9 %  sodium chloride infusion   Intravenous PRN Schnier, 04/30/2020, MD   Stopped at 04/20/2020 0057  . 0.9 %  sodium chloride infusion   Intravenous Continuous Schnier, 04/20/2020, MD 50 mL/hr at 04/12/2020 2223 New Bag at 04/21/2020 2223  . acetaminophen (TYLENOL) tablet 650 mg  650 mg Per Tube Q6H PRN 2224, MD      . alum & mag hydroxide-simeth (MAALOX/MYLANTA) 200-200-20 MG/5ML suspension 15 mL  15 mL Per Tube Q6H PRN Schnier, 01-09-2001,  MD      . ampicillin (OMNIPEN) 2 g in sodium chloride 0.9 % 100 mL IVPB  2 g Intravenous Q6H Schnier, Latina Craver, MD   Stopped at 04/28/20 1358  . cefTRIAXone (ROCEPHIN) 2 g in sodium chloride 0.9 % 100 mL IVPB  2 g Intravenous Q12H Schnier, Latina Craver, MD   Stopped at 04/28/20 1001  . Chlorhexidine Gluconate Cloth 2 % PADS 6 each  6 each Topical Daily Schnier, Latina Craver, MD   6 each at 04/28/20 [phone]  . enoxaparin (LOVENOX) injection 40 mg  40 mg Subcutaneous Q24H Schnier, Latina Craver, MD   40 mg at May 09, 2020 2230  . feeding supplement (OSMOLITE 1.2 CAL)  liquid 1,000 mL  1,000 mL Per Tube Continuous Lynn Ito, MD 50 mL/hr at 04/28/20 1340 1,000 mL at 04/28/20 1340  . fluticasone (FLONASE) 50 MCG/ACT nasal spray 2 spray  2 spray Each Nare Daily Schnier, Latina Craver, MD   2 spray at 2020-05-09 0804  . fluticasone (FLOVENT HFA) 44 MCG/ACT inhaler 2 puff  2 puff Inhalation BID Schnier, Latina Craver, MD   2 puff at 05/09/2020 0804  . free water 30 mL  30 mL Per Tube Q4H Lynn Ito, MD   30 mL at 04/28/20 1220  . HYDROmorphone (DILAUDID) injection 1 mg  1 mg Intravenous Once PRN Schnier, Latina Craver, MD      . influenza vac split quadrivalent PF (FLUARIX) injection 0.5 mL  0.5 mL Intramuscular Tomorrow-1000 Agbata, Tochukwu, MD      . mirtazapine (REMERON SOL-TAB) disintegrating tablet 30 mg  30 mg Oral QHS Schnier, Latina Craver, MD   30 mg at 09-May-2020 2230  . multivitamin liquid 15 mL  15 mL Per Tube Daily Schnier, Latina Craver, MD   15 mL at 04/28/20 0925  . nutrition supplement (JUVEN) (JUVEN) powder packet 1 packet  1 packet Per Tube BID BM Lynn Ito, MD   1 packet at 04/28/20 1340  . ondansetron (ZOFRAN) tablet 4 mg  4 mg Per Tube Q6H PRN Schnier, Latina Craver, MD       Or  . ondansetron [facility]) injection 4 mg  4 mg Intravenous Q6H PRN Schnier, Latina Craver, MD      . ondansetron [facility]) injection 4 mg  4 mg Intravenous Q6H PRN Schnier, Latina Craver, MD   4 mg at 04/28/20 0934  . pantoprazole sodium (PROTONIX) 40 mg/20 mL oral suspension 20 mg  20 mg Per Tube Daily Schnier, Latina Craver, MD   20 mg at 04/28/20 0923  . phenol (CHLORASEPTIC) mouth spray 1 spray  1 spray Mouth/Throat PRN Schnier, Latina Craver, MD      . sodium chloride flush (NS) 0.9 % injection 10-40 mL  10-40 mL Intracatheter PRN Schnier, Latina Craver, MD      . tiotropium (SPIRIVA) inhalation capsule ([facility] use ONLY) 18 mcg  18 mcg Inhalation Daily Schnier, Latina Craver, MD   18 mcg at 04/28/20 3149     Discharge Medications: Please see discharge summary for a list of discharge medications.  Relevant  Imaging Results:  Relevant Lab Results:   Additional Information SS#: [id number]  Margarito Liner, LCSW

## 2020-04-28 NOTE — Consult Note (Signed)
PHARMACY CONSULT NOTE - FOLLOW UP  Pharmacy Consult for Electrolyte Monitoring and Replacement   Recent Labs: Potassium (mmol/L)  Date Value  04/28/2020 3.5   Magnesium (mg/dL)  Date Value  01/60/1093 1.8   Calcium (mg/dL)  Date Value  23/55/7322 7.8 (L)   Albumin (g/dL)  Date Value  02/54/2706 2.2 (L)   Phosphorus (mg/dL)  Date Value  23/76/2831 2.5   Sodium (mmol/L)  Date Value  04/28/2020 144     Assessment: Pharmacy has been consulted to electrolyte management in a 54 y.o.femalewith medical history significant forCOPD, quadriplegia status post spinal cord injury and cystectomy with ileal conduit diversion who presents to the ER withSBO. Per notes, patient is at goal rate of enteral feeds (40 mL/hr).   Corrected Calcium 8.94  Mg 1.8 K 3.5  Goal of Therapy:  Electrolytes WNL  Plan:  1. Will order Mg IV 2g x1. 2. Will order KCL IV 10 mEq x 2 runs.  3. Will follow- up with AM labs tomorrow.   Katha Cabal ,PharmD Clinical Pharmacist 04/28/2020 7:21 AM
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
## 2020-04-28 NOTE — Progress Notes (Signed)
PT Cancellation Note  Patient Details Name: [name] MRN: [id number] DOB: [birth year]   Cancelled Treatment:    Reason Eval/Treat Not Completed: Other (comment). PT spoke with pt and social work regarding pt's POC. Per pt, she has been bedridden for 4-5 years, has not had physical therapy in years. Prior to several years ago she was able to drive her power wheelchair and had some ability to move her lower extremities. She stated she has a partner, sons/nephews, and an aide that provide total assist at baseline. Pt did report that her partner has had their own health concerns and is currently in rehab in [place], and she is very motivated to be nearby and to improve her own mobility. Though pt is motivated, due to her current baseline PT anticipates she is more appropriate for long term care transition, CSW notified. PT to sign off.  Olga Coaster PT, DPT 10:29 AM,04/28/20

## 2020-04-28 NOTE — Progress Notes (Signed)
[name]   Subjective: Patient is without complaint.  Asking when her NG tube can be removed.  No issues overnight.  Objective: Vitals:   05/01/2020 1955 04/08/2020 2342 04/28/20 0606 04/28/20 0742  BP: 97/64 91/60 108/69 111/63  Pulse: (!) 125 (!) 103 (!) 101 (!) 107  Resp: 16 16 16 20   Temp: 98 F (36.7 C) 97.9 F (36.6 C) 97.9 F (36.6 C) 98.6 F (37 C)  TempSrc: Oral Oral Oral Oral  SpO2: 98% 97% 100% 96%  Weight:      Height:        Intake/Output Summary (Last 24 hours) at 04/28/2020 1002 Last data filed at 04/28/2020 0557 Gross per 24 hour  Intake 2854.91 ml  Output 1600 ml  Net 1254.91 ml   Physical Exam: A&Ox3, NAD Neck:  Access site: Clean dry and intact.  No swelling or drainage noted. CV: RRR Pulmonary: CTA Bilaterally Abdomen: Soft, Nontender, Nondistended Vascular: Extremities are warm distally   Laboratory: CBC    Component Value Date/Time   WBC 13.2 (H) 04/26/2020 0543   HGB 11.0 (L) 04/26/2020 0543   HCT 35.1 (L) 04/26/2020 0543   PLT 317 04/26/2020 0543   BMET    Component Value Date/Time   NA 144 04/28/2020 0607   K 3.5 04/28/2020 0607   CL 111 04/28/2020 0607   CO2 26 04/28/2020 0607   GLUCOSE 209 (H) 04/28/2020 0607   BUN 12 04/28/2020 0607   CREATININE <0.30 (L) 04/28/2020 0607   CALCIUM 7.8 (L) 04/28/2020 0607   GFRNONAA NOT CALCULATED 04/28/2020 0607   GFRAA NOT CALCULATED 10/13/2018 1411   Assessment/Planning: The patient is a 54 year old female with multiple medical issues s/p unsuccessful IVC filter retrieval - POD#1  1) Unfortunately, the patient's IVC filter retrieval was unsuccessful. Multiple catheters, snare sheaths, and guiding catheters were all used in an attempt to secure the hook however on imaging the hook of the catheter is embedded underneath the endothelium of the posterior wall of the left renal vein.  Attempts at grasping the filter with a snare and pulling it free of  the endothelium were not successful.   Discussed with Dr. 57 Delquan Poucher PA-C 04/28/2020 10:02 AM

## 2020-04-28 NOTE — Progress Notes (Signed)
Nutrition Follow-up  DOCUMENTATION CODES:   Severe malnutrition in context of chronic illness  INTERVENTION:   Change to Osmolite 1.2 '@50ml' /hr  Free water flushes 66m q4 hours to maintain tube patency   Regimen provides 1440kcal/day, 67g/day protein and 11675mday free water   Liquid MVI daily via tube  Juven Fruit Punch BID via tube, each serving provides 95kcal and 2.5g of protein (amino acids glutamine and arginine)  Once G-tube in place, change to:  Change to Osmolite 1.2- 5 cans daily-Flush with 2572mf water before and after each tube feed  Regimen provides 1425kcal/day, 66g/day protein and 1225m63my free water  NUTRITION DIAGNOSIS:   Severe Malnutrition related to chronic illness (COPD, quadriplegia) as evidenced by percent weight loss, severe fat depletion, severe muscle depletion. Ongoing.  GOAL:   Patient will meet greater than or equal to 90% of their needs Met with TF regimen.  MONITOR:   PO intake, Labs, Weight trends, TF tolerance, Skin, I & O's  ASSESSMENT:   54 y72. 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   Pt tolerating tube feeds well at goal rate. Pt NPO yesterday for procedure. Refeed labs stabilizing. Plan is for IR G-tube placement 10/29. Met with pt in room on 10/26. Pt reports that she is feeling good about having a tube placed; pt agrees that she needs more nutrition. Gave pt the option of continuous feeds, nocturnal feeds or bolus feeds; pt prefers bolus feeds. Will plan to switch pt over to bolus feeds once G-tube in place. Will add Juven to support wound healing. Per chart, pt is up ~7lbs since admit; RD will continue to monitor. Pt on clear liquid diet. Pt reports that she is hungry and enjoying her broth.    Medications reviewed and include: lovenox, remeron, MVI, protonix, ampicillin, ceftriaxone, Mg sulfate,  KCl  Labs reviewed: K 3.5 wnl, creat <0.30(L), P 2.5 wnl, Mg 1.8 wnl Wbc- 13.2(H) cbgs- 166, 163 x 24 hrs  Diet Order:   Diet Order            Diet clear liquid Room service appropriate? Yes; Fluid consistency: Thin  Diet effective now                EDUCATION NEEDS:   Not appropriate for education at this time  Skin:  Skin Assessment: Reviewed RN Assessment (Left heel: 0.1cm x 0.2cm x 0.1cm, Right heel: healed, Right IT: Stage 3 with 2cm x 3.5cm x 0.1cm pink wound in the center of a 5cm x 4cm previous wound (now scar tissue); undermining measuring 0.6cm at distal margin, Sacrum: 6.5cm x 15cm x 0.3cm)  Last BM:  10/27- type 5  Height:   Ht Readings from Last 1 Encounters:  04/14/2020 '5\' 3"'  (1.6 m)   Weight:   Wt Readings from Last 1 Encounters:  05/02/2020 37.1 kg   Ideal Body Weight:  38 kg (adjusted for quadraplegia)  BMI:  Body mass index is 14.49 kg/m.  Estimated Nutritional Needs:   Kcal:  1200-1400kcal/day  Protein:  60-70g/day  Fluid:  1-1.2L/day  CaseKoleen Distance RD, LDN Please [facility] and/or RD on-call/weekend/after hours pager

## 2020-04-28 NOTE — TOC Initial Note (Signed)
Transition of Care [facility]) - Initial/Assessment Note    Patient Details  Name: [name] MRN: [id number] Date of Birth: 1965/09/23  [facility] CM/SW Contact:    Candie Chroman, LCSW Phone Number: 04/28/2020, 3:27 PM  Clinical Narrative:  CSW met with patient. No supports at bedside. CSW introduced role and explained that discharge planning would be discussed. Patient confirmed interest in going to [place] [facility] for rehab and that someone she knows is there as well. She provided CSW with their name. CSW made her aware that we may not be able to skill her for rehab but will certainly try. Patient stated she did not tell PT that she had been bed-bound for 5-6 years and that she had only been bed bound for 6 months-1 year. Per PT, she told OT that she had been bed-bound for about 2 months. CSW asked patient was she had been doing before 6 months to a year ago and she said "I was getting up." Discussed potential for long-term care at a SNF since she does have Medicaid. Patient wants to go home after rehab. CSW sent referral to [facility] and asked admissions coordinator to review. No further concerns. CSW encouraged patient to contact CSW as needed. CSW will continue to follow patient for support and facilitate discharge to SNF vs. Home when stable.                Expected Discharge Plan: Skilled Nursing Facility Barriers to Discharge: Continued Medical Work up   Patient Goals and CMS Choice        Expected Discharge Plan and Services Expected Discharge Plan: [place] Acute Care Choice: [place] Living arrangements for the past 2 months: Single Family Home                                      Prior Living Arrangements/Services Living arrangements for the past 2 months: Single Family Home Lives with:: Self Patient language and need for interpreter reviewed:: Yes Do you feel safe going back to the place where  you live?: Yes      Need for Family Participation in Patient Care: Yes (Comment) Care giver support system in place?: Yes (comment) Current home services: DME, Home RN, Homehealth aide Criminal Activity/Legal Involvement Pertinent to Current Situation/Hospitalization: No - Comment as needed  Activities of Daily Living Home Assistive Devices/Equipment: Wheelchair ADL Screening (condition at time of admission) Patient's cognitive ability adequate to safely complete daily activities?: Yes Is the patient deaf or have difficulty hearing?: No Does the patient have difficulty seeing, even when wearing glasses/contacts?: No Does the patient have difficulty concentrating, remembering, or making decisions?: No Patient able to express need for assistance with ADLs?: Yes Does the patient have difficulty dressing or bathing?: Yes Independently performs ADLs?: No Communication: Independent Dressing (OT): Dependent Is this a change from baseline?: Pre-admission baseline Grooming: Dependent Is this a change from baseline?: Pre-admission baseline Feeding: Independent with device (comment) (needs assistance) Bathing: Dependent Is this a change from baseline?: Pre-admission baseline Toileting: Dependent Is this a change from baseline?: Pre-admission baseline In/Out Bed: Dependent Is this a change from baseline?: Pre-admission baseline Walks in Home: Dependent Is this a change from baseline?: Pre-admission baseline Does the patient have difficulty walking or climbing stairs?: Yes Weakness of Legs: Both Weakness of Arms/Hands: Both  Permission Sought/Granted Permission sought  to share information with : Chartered certified accountant granted to share information with : Yes, Verbal Permission Granted     Permission granted to share info w AGENCY: [facility]        Emotional Assessment Appearance:: Appears stated age Attitude/Demeanor/Rapport: Engaged, Gracious Affect  (typically observed): Accepting, Appropriate, Calm, Pleasant Orientation: : Oriented to Self, Oriented to Place, Oriented to  Time, Oriented to Situation Alcohol / Substance Use: Not Applicable Psych Involvement: No (comment)  Admission diagnosis:  SBO (small bowel obstruction) ([place]) [K56.609] Urinary tract infection without hematuria, site unspecified [N39.0] Patient Active Problem List   Diagnosis Date Noted  . Acute encephalopathy   . Electrolyte abnormality   . Protein-calorie malnutrition, severe 04/21/2020  . Goals of care, counseling/discussion   . Palliative care by specialist   . Hypokalemia   . Leukocytosis   . SBO (small bowel obstruction) ([place]) 04/12/2020  . Sacral decubitus ulcer, stage III ([place]) 04/19/2020  . Pressure ulcer of back 04/17/2020  . COPD (chronic obstructive pulmonary disease) ([place])   . History of urostomy   . 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 Urinary tract infection without hematuria   . Pneumonia 09/07/2016  . Pressure injury of skin 09/07/2016   PCP:  [place], Julie A, MD Pharmacy:   [facility], [address] [address] Phone: [phone] Fax: [phone] [address], [place] [address] Phone: [phone] Fax: [place], [place] [facility] [place] [facility] [address] AT [address] [facility] [place] [address] Phone: [phone] Fax: [phone]     Social Determinants of Health (SDOH) Interventions    Readmission Risk Interventions No flowsheet data found.

## 2020-04-29 ENCOUNTER — Inpatient Hospital Stay: Payer: Medicare Other

## 2020-04-29 MED FILL — Medication: Qty: 1 | Status: AC

## 2020-04-30 ENCOUNTER — Other Ambulatory Visit: Payer: Medicare Other
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
## 2020-05-03 NOTE — Progress Notes (Signed)
Ch paged to be informed about Pt's family arriving through the ED entrance, who will need escorting to room.  2:00 a.m.   Ch paged to escort family from ED. Ch escorted Pt's daughters to room. Ch provided pastoral presence and support to other family members in room as well. Family will call and let RN know about choice of funeral home. Ch escorted family back to ED.

## 2020-05-03 NOTE — Progress Notes (Signed)
This RN was with pt in the room, and pt began to develop a wet cough. Shortly after pt attempted to cough, pt was unable to breathe. Code blue was called, and CPR started.

## 2020-05-03 NOTE — Progress Notes (Signed)
Chaplin notified of family members arrival. Family members at bedside. All belongings returned.

## 2020-05-03 NOTE — Progress Notes (Addendum)
Arrived at code blue and assisted with bag-mask ventilation around 11:50, Dr from the ED intubated at bedside at 23:59, 7.0 tube, @23  lip, pt got ROSC at one point and then staff began to resume CPR again after pulse was lost.. Pt never regained pulse and death was called at 12:20.

## 2020-05-03 NOTE — Death Summary Note (Signed)
Death Summary  [name] NLZ:[id number] DOB: 30-Oct-1965 DOA: May 13, 2020  PCP: [place], Julie A, MD  Admit date: 05-13-2020 Date of Death: 05-25-2020 Time of Death: 12:20am Notification: [place], Julie A, MD notified of death of 05/25/2020   History of present illness:  [name] is a 54 y.o. 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  She complained of generalized weakness and poor oral intake but denied having any cough, no chest pain, no shortness of breath, no fever, no chills, no diaphoresis or palpitations.CT scan of abdomen and pelvis shows High-grade small bowel obstruction.The transition point is likely in the right lower abdomen a right pelvis with some decompressed loops in this region. There is pneumatosis associated with left upper quadrant loops of small bowel. No free air to suggest perforation. General surgery was consulted.  NG tube was placed.  Patient was kept n.p.o. and was placed on IV fluids for hydration IV PPI and antiemetics.  She was also found with UTI and was treated empirically.  She also had stage III pressure injury to sacrum/decubitus ulcer and a stage II pressure injury in the back of the shoulder and stage II pressure injury of the back of her thigh.  Wound care was consulted.  She also appeared to have altered mental status.  Neurology was consulted.  Acute metabolic encephalopathy suspected due to severe hypernatremia/hypokalemia/hyperchloremia/hypomagnesimia/hypophosphatemia Suspectedbacterial meningitis Came in with high sodium levels Ct head no acute finding Unable to do LP secondary to disrupted anatomy of the back and skin breakdown EEG -continuous  generalized slowing was consistent with encephalopathy.  There were periodic epileptiform discharges with triphasic morphology which at times appear rhythmic with some evolution, falling along the ictalinterictal spectrum.  No definite seizures were seen Per neurology continue meningitis antibiotic treatment for possible meningitis for total of 14 days ID was following.   Small bowel obstruction -CT of the abdomen found with high-grade small bowel obstruction  General surgery was consulted , any surgical procedures was on hold with plans for conservative therapy Patient was started on NG tube feeding, IV fluid for hydration. CT abdomen was obtained- showed old IVC filter strut with possible risk of duodenal penetration.  Vascular was consulted, s/p attempt for removal of IVC which retrieval was unsuccessful On 10/26   Severe malnutrition with failure to thrive, poor PO intake Refeeding syndrome with electrolyte abnormality -At Severe Malnutrition-2/2 chronic illness (COPD, quadriplegia) -On TPN Started on NGT feeding with weaning of TPN.  Pt refused PEG placement but then decided to have one place which was planned. 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  Patient had difficulty breathing and had PEA arrest.  CODE BLUE was called.  ACLS protocol was underway.  Code was called off and patient was pronounced at 12:20 AM on 05/09/2020   Final Diagnoses:  1.  Acute metabolic encephalopathy  2.suspected due to severe hyponatremia 3.  Hypokalemia 4.  Hypercholesteremia 5.  Hypomagnesemia 6.  Hypophosphatemia 7.  Bacterial meningitis 8.  High-grade small bowel obstruction 9.  Severe malnutrition with failure to thrive 10.  Poor p.o. intake 11.  Refeeding syndrome with electrolyte abnormalities 12.  Leukocytosis 13.  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  Chronic quadriplegia 16.  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  COPD 18.ChronicDecubitus ulcers   The results of significant diagnostics from this hospitalization (including imaging, microbiology, ancillary and laboratory) are listed below for reference.    Significant Diagnostic Studies: EEG  Result Date: 04/23/2020 Charlsie Quest, MD     04/23/2020  2:13 PM Patient Name: [name] MRN: [id number] Epilepsy Attending: Charlsie Quest Referring Physician/Provider: Dr. 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  EEG to evaluate for seizures. Level of alertness: Lethargic AEDs during EEG study: None Technical aspects: This EEG study was done with scalp electrodes positioned according to the 10-20 International system of electrode placement. Electrical activity was acquired at a sampling rate of 500Hz  and reviewed with a high frequency filter of 70Hz  and a low frequency filter of 1Hz . EEG data were recorded continuously and digitally stored. Description: EEG showed continuous generalized 3 to 5 cm  in delta slowing.  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  At times these discharges appeared rhythmic with some evolution and frequency to 1.5 to 2 Hz without any evolution or morphology.  Physiologic photic driving was not seen during photic stimulation.  Hyperventilation was not performed. ABNORMALITY - Continuous slow, generalized - Periodic epileptiform discharges with triphasic morphology, generalized IMPRESSION: This study showed periodic epileptiform discharges with triphasic morphology which at times appeared rhythmic with some evolution and frequency and not therefore on the ictal-interictal continuum.  Additionally, there is evidence of moderate to severe diffuse encephalopathy, nonspecific to etiology.  No definite seizures were seen during the study. Priyanka Annabelle Harman   CT ABDOMEN PELVIS WO CONTRAST  Result Date: 04/26/2020 CLINICAL DATA:  COPD, quadriplegia and cystectomy with ileal conduit diversion. Nausea, vomiting and diffuse abdominal pain. EXAM: CT ABDOMEN AND PELVIS WITHOUT CONTRAST TECHNIQUE: Multidetector CT imaging of the abdomen and pelvis was performed following the standard protocol without IV contrast. COMPARISON:  Prior CT abdomen/pelvis 04/28/2020 FINDINGS: Lower chest: Small bilateral pleural effusions with associated atelectasis. Persistent but improving patchy airspace opacity in the periphery of the posterior right upper lobe. Interval clearing of airspace disease from the right lower lobe. Hepatobiliary: Normal hepatic contour and morphology. Small low-attenuation lesion in the posterior right hepatic lobe previously characterized as a cyst. Cholelithiasis again noted. The gallstones are hyperattenuating. No biliary ductal dilatation. Pancreas: Unremarkable. No pancreatic ductal dilatation or surrounding inflammatory changes. Spleen: Normal in size without focal abnormality. Adrenals/Urinary Tract: Surgical changes  of prior cystectomy with left lower quadrant diverting ileostomy. Limited evaluation of the kidneys in the absence of intravenous  contrast. Perhaps mild right-sided hydronephrosis versus fullness of the collecting system. Punctate parenchymal calcifications versus nonobstructing stones in the right renal parenchyma. Visualized ureters are unremarkable. Stomach/Bowel: Gastric tube present within the stomach. Interval resolution of small bowel obstruction. However, there has been development of small volume ascites which is new. Vascular/Lymphatic: Limited evaluation in the absence of intravenous contrast. IVC filter is present with penetration of the struts, 1 of the right-sided struts may penetrate into the duodenum. Reproductive: IUD in place. Similar appearance of a 2.8 cm low-attenuation lesion affiliated with the right adnexa. The left adnexa is unremarkable. Other: Small volume ascites throughout the abdomen and pelvis. Musculoskeletal: No acute or significant osseous findings. IMPRESSION: 1. While there has been significant improvement in the previously noted small bowel obstruction, there has been interval development of small volume ascites of uncertain etiology. 2. Stable appearance of IVC filter with multifocal strut penetration including 1 strut which may enter the duodenal lumen. This could represent a source for the patient's abdominal pain. Consider referral to Interventional Radiology for evaluation of possible filter retrieval. 3. Small bilateral pleural effusions with associated atelectasis. 4. Improved right lower lobe airspace opacity with new patchy airspace disease in the posterior and lateral right upper lobe. Suspect small volume aspiration. 5. Gastric tube in place. 6. IUD in place. 7. Additional ancillary findings as above. Electronically Signed   By: Malachy MoanHeath  McCullough M.D.   On: 04/26/2020 16:09   DG Abd 1 View  Result Date: 04/25/2020 CLINICAL DATA:  Abdominal pain, vomiting and  weakness. EXAM: ABDOMEN - 1 VIEW COMPARISON:  04/24/2020 radiograph and prior studies FINDINGS: An NG tube is noted within the proximal-mid stomach. Mild gastric distention is noted. Gas within the colon is noted. No dilated small bowel loops are present. IVC filter, spinal cord stimulator and IUD again noted. No other changes noted. IMPRESSION: NG tube within the proximal-mid stomach with mild gastric distention. Electronically Signed   By: Harmon PierJeffrey  Hu M.D.   On: 04/25/2020 18:40   DG Abd 1 View  Result Date: 04/24/2020 CLINICAL DATA:  Small bowel obstruction EXAM: ABDOMEN - 1 VIEW COMPARISON:  Yesterday FINDINGS: A few loops of distended small bowel are seen in the central abdomen. Enteric contrast is seen throughout the nondilated colon. Enteric tube remains in good position. Stable aeration at the bases. No concerning mass effect or gas collection. Stable postoperative changes which are multifocal IMPRESSION: Stable findings of partial small bowel obstruction. Electronically Signed   By: Marnee SpringJonathon  Watts M.D.   On: 04/24/2020 09:53   DG Abd 1 View  Result Date: 04/09/2020 CLINICAL DATA:  Small-bowel obstruction EXAM: ABDOMEN - 1 VIEW COMPARISON:  04/21/2020 FINDINGS: Nasogastric tube remains well positioned within the stomach. Oral contrast is seen within large and small bowel. Decreased caliber of small bowel loops. No gross free intraperitoneal air. IVC filter in place. IMPRESSION: Oral contrast within large and small bowel. Decreased caliber of small bowel loops. Electronically Signed   By: Duanne GuessNicholas  Plundo D.O.   On: 04/07/2020 08:07   DG Abd 1 View  Result Date: 04/19/2020 CLINICAL DATA:  Small bowel obstruction EXAM: ABDOMEN - 1 VIEW COMPARISON:  Yesterday FINDINGS: Continued small bowel obstruction with dilated loops measuring up to 5 cm in the central abdomen. Enteric tube remains in good position. Baclofen pump, IUD, and IVC filter. Prominent osteopenia. IMPRESSION: Continued small bowel  obstruction. Electronically Signed   By: Marnee SpringJonathon  Watts M.D.   On: 04/19/2020 06:45   CT HEAD WO CONTRAST  Result Date: 04/28/2020 CLINICAL DATA:  Mental status change with unknown cause EXAM: CT HEAD WITHOUT CONTRAST TECHNIQUE: Contiguous axial images were obtained from the base of the skull through the vertex without intravenous contrast. COMPARISON:  01/20/2008 FINDINGS: Brain: No evidence of acute infarction, hemorrhage, hydrocephalus, extra-axial collection or mass lesion/mass effect. Small remote biparietal cortically based insult. Vascular: No hyperdense vessel or unexpected calcification. Skull: Normal. Negative for fracture or focal lesion. Retro dental ligamentous thickening with some degree of canal narrowing, assessment limited by coverage and stable when compared to prior. Sinuses/Orbits: No acute finding. IMPRESSION: No acute finding or change from 2009. Electronically Signed   By: Marnee Spring M.D.   On: 04/13/2020 10:11   CT Abdomen Pelvis W Contrast  Result Date: 04/26/2020 CLINICAL DATA:  Abdominal pain. Nausea, vomiting, and weakness for 4 days. Patient is paraplegic. EXAM: CT ABDOMEN AND PELVIS WITH CONTRAST TECHNIQUE: Multidetector CT imaging of the abdomen and pelvis was performed using the standard protocol following bolus administration of intravenous contrast. CONTRAST:  80mL OMNIPAQUE IOHEXOL 300 MG/ML  SOLN COMPARISON:  None. FINDINGS: Lower chest: Opacity in the right base as seen on series 4, image 1 is consistent with an infectious or inflammatory process. Probable atelectasis in the left base. No other abnormalities in the lung bases. Hepatobiliary: There is a cyst or hemangioma in the right hepatic lobe. The liver is otherwise normal. Cholelithiasis is identified without obvious wall thickening. The portal vein is patent. Pancreas: Unremarkable. No pancreatic ductal dilatation or surrounding inflammatory changes. Spleen: Normal in size without focal abnormality.  Adrenals/Urinary Tract: Adrenal glands are normal. Small bilateral renal cysts are noted. No suspicious masses. No hydronephrosis. There is a punctate stone in the right kidney on coronal image 49 measuring 2 mm. Visualized portions of the ureters are normal. No ureterectasis or ureteral stones. The patient has a urostomy which is decompressed on this study and empties in the left lower quadrant. Stomach/Bowel: Stomach is unremarkable. The small bowel is dilated nearly along its entire length. There appear to be a few decompressed loops of small bowel in the right pelvis as seen on coronal image 30. There appears to be pneumatosis associated with some loops of small bowel in the left upper quadrant, well seen on coronal image 33. There is marked fecal loading in the rectum and distal sigmoid colon. The ascending colon and transverse colon are decompressed. The descending colon and proximal sigmoid colon are not well visualized. The appendix is not visualized but there is no secondary evidence of appendicitis. 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 The abdominal aorta is nonaneurysmal without significant atherosclerotic change. No gas seen in the venous system despite the pneumatosis. No adenopathy. Reproductive: The patient has an IUD. There appears to be a dominant cyst in the region the right adnexa, simple in appearance measuring up to 2.7 cm. No abnormalities in the left adnexa. Other: There is some free fluid in the abdomen, likely secondary to the bowel obstruction. Musculoskeletal: No acute bony abnormalities identified. IMPRESSION: 1. High-grade small bowel obstruction. The transition point is likely in the right lower abdomen a right pelvis with some decompressed loops in this region. There is pneumatosis associated with left upper quadrant loops of small bowel. No free air on today's study to suggest perforation. 2. Marked fecal loading in the rectum and distal sigmoid colon. 3. Infiltrate in the  right base worrisome for pneumonia. 4. Decompression of the ascending and transverse colon. Poor visualization of the descending colon and proximal sigmoid  colon. 5. Left lower quadrant urostomy.  No renal abnormalities are noted. 6. Cholelithiasis. 7. Probable dominant cyst/follicle in the right ovary, simple in appearance measuring 2.7 cm. No follow-up necessary. Findings called to Dr. Larinda Buttery. Electronically Signed   By: Gerome Sam III M.D   On: 04/16/2020 13:55   PERIPHERAL VASCULAR CATHETERIZATION  Result Date: 04/25/2020 See Op Note  DG Abd Portable 1V  Result Date: 04/23/2020 CLINICAL DATA:  Small-bowel obstruction EXAM: PORTABLE ABDOMEN - 1 VIEW COMPARISON:  None. FINDINGS: Nasogastric tube is seen within the left upper quadrant of the abdomen overlying the gastric fundus. Contrast previously noted within the distal small bowel has now passed into the colon and rectum which is decompressed. Multiple gas-filled dilated loops of small bowel are still identified within the mid abdomen and, together, the findings are compatible with a partial small bowel obstruction. No other changes seen. Inferior vena cava filter, intrathecal pain pump, and intrauterine device noted. IMPRESSION: Partial small bowel obstruction with passage of contrast into a decompressed colon and rectum. Electronically Signed   By: Helyn Numbers MD   On: 04/23/2020 08:28   DG Abd Portable 1V-Small Bowel Obstruction Protocol-24 hr delay  Result Date: 04/21/2020 CLINICAL DATA:  Small bowel obstruction EXAM: PORTABLE ABDOMEN - 1 VIEW COMPARISON:  Yesterday FINDINGS: Definite improvement in small bowel distension, although still diffusely and abnormally dilated up to 3.4 cm. Some contrast has reached the rectum and been evacuated Enteric tube remains in good position. Stable pre-existing hardware. IMPRESSION: Improved small bowel obstruction with interval passage of contrast into colon. Electronically Signed   By: Marnee Spring M.D.   On: 04/21/2020 10:23   DG Abd Portable 1V-Small Bowel Obstruction Protocol-initial, 8 hr delay  Result Date: 04/20/2020 CLINICAL DATA:  Nausea, vomiting EXAM: PORTABLE ABDOMEN - 1 VIEW COMPARISON:  03/21/2020 FINDINGS: NG tube is in the stomach. Contrast material is seen within the stomach. Dilated small bowel loops are again noted in the abdomen and upper pelvis compatible with small bowel obstruction. No significant change since prior study. No organomegaly. IVC filter in place. IMPRESSION: Continued dilated small bowel loops compatible with small bowel obstruction. No change since prior study. Contrast material seen within the stomach presumably injected through the NG tube. Electronically Signed   By: Charlett Nose M.D.   On: 04/20/2020 19:14   DG Abd Portable 1V  Result Date: 04/20/2020 CLINICAL DATA:  Small bowel obstruction EXAM: PORTABLE ABDOMEN - 1 VIEW COMPARISON:  04/19/2020 FINDINGS: Enteric tube tip is in the left upper quadrant consistent with location in the upper stomach. Medication pump in the right lower quadrant. IVC filter. Surgical clips in the pelvis. Intrauterine device. Left lower quadrant ostomy. Gaseous distention of upper abdominal small bowel with decreased gas in the colon. Stool-filled rectum. Changes are consistent with small bowel obstruction. No significant improvement since prior study. Degenerative changes in the spine and hips. Vascular calcifications. IMPRESSION: Persistent gaseous distention of upper abdominal small bowel consistent with small bowel obstruction. Electronically Signed   By: Burman Nieves M.D.   On: 04/20/2020 05:11   DG Abd Portable 1 View  Result Date: 04/06/2020 CLINICAL DATA:  NG tube placement EXAM: PORTABLE ABDOMEN - 1 VIEW COMPARISON:  11/21/2010 FINDINGS: NG tube coils in the fundus of the stomach. IMPRESSION: NG tube in the stomach. Electronically Signed   By: Charlett Nose M.D.   On: 04/26/2020 15:51   [place] EKG SITE  RITE  Result Date: 04/21/2020 If [facility] image not attached,  placement could not be confirmed due to current cardiac rhythm.  DG FL GUIDED LUMBAR PUNCTURE  Result Date: 04/11/2020 CLINICAL DATA:  Acute encephalopathy. EXAM: DIAGNOSTIC LUMBAR PUNCTURE UNDER FLUOROSCOPIC GUIDANCE FLUOROSCOPY TIME:  Fluoroscopy Time:  1 minutes 18 seconds Radiation Exposure Index (if provided by the fluoroscopic device): 14.2 mGy Number of Acquired Spot Images: 2 PROCEDURE: After discussing the risks and benefits of this procedure with the patient's son informed consent was obtained. The patient was evaluated under fluoroscopy. The large ulceration and adjacent inflammatory tissue on the patient's lower back and sacrum is over the site that we would use for lumbar puncture making access not possible. In addition the patient's pain pump in the anterior abdomen is overlying the spine making evaluation of an access site difficult. Lumbar puncture not performed. IMPRESSION: Lumbar puncture not performed as discussed above. Electronically Signed   By: Maisie Fus  Register   On: 04/23/2020 16:44    Microbiology: No results found for this or any previous visit (from the past 240 hour(s)).   Labs: Basic Metabolic Panel: Recent Labs  Lab 04/23/20 1401 04/23/20 1401 04/24/20 0500 04/24/20 0500 04/25/20 0910 04/26/20 0543 04/26/20 0543 05-04-2020 0737 04/28/20 0607  NA 139  --  145  --   --  145  --  142 144  K 3.6   < > 3.4*   < >  --  4.2   < > 3.9 3.5  CL 104  --  107  --   --  110  --  108 111  CO2 27  --  32  --   --  29  --  27 26  GLUCOSE 118*  --  112*  --   --  170*  --  96 209*  BUN 6  --  7  --   --  16  --  16 12  CREATININE <0.30*  --  <0.30*  --   --  <0.30*  --  <0.30* <0.30*  CALCIUM 7.2*  --  7.5*  --   --  7.7*  --  7.5* 7.8*  MG  --   --  1.9  --   --  1.7  --  1.7 1.8  PHOS  --   --  2.1*  --  3.6 2.9  --  3.0 2.5   < > = values in this interval not displayed.   Liver Function Tests: Recent  Labs  Lab 04/26/20 0543  AST 11*  ALT 6  ALKPHOS 42  BILITOT 0.2*  PROT 5.6*  ALBUMIN 2.2*   No results for input(s): LIPASE, AMYLASE in the last 168 hours. No results for input(s): AMMONIA in the last 168 hours. CBC: Recent Labs  Lab 04/23/20 1222 04/26/20 0543  WBC 11.6* 13.2*  NEUTROABS  --  11.1*  HGB 11.0* 11.0*  HCT 34.2* 35.1*  MCV 86.8 89.3  PLT 402* 317   Cardiac Enzymes: No results for input(s): CKTOTAL, CKMB, CKMBINDEX, TROPONINI in the last 168 hours. D-Dimer No results for input(s): DDIMER in the last 72 hours. BNP: Invalid input(s): POCBNP CBG: Recent Labs  Lab 04/28/20 0738 04/28/20 1150 04/28/20 1619 04/28/20 2038 04/28/20 2337  GLUCAP 163* 152* 104* 114* 98   Anemia work up No results for input(s): VITAMINB12, FOLATE, FERRITIN, TIBC, IRON, RETICCTPCT in the last 72 hours. Urinalysis    Component Value Date/Time   COLORURINE AMBER (A) 04/24/2020 1019   APPEARANCEUR CLOUDY (A) 04/13/2020 1019   LABSPEC 1.012 04/13/2020 1019  PHURINE 6.0 04/19/2020 1019   GLUCOSEU NEGATIVE 04/19/2020 1019   HGBUR SMALL (A) 04/16/2020 1019   BILIRUBINUR NEGATIVE 04/20/2020 1019   KETONESUR 5 (A) 05/02/2020 1019   PROTEINUR 30 (A) 04/11/2020 1019   NITRITE NEGATIVE 04/28/2020 1019   LEUKOCYTESUR SMALL (A) 04/03/2020 1019   Sepsis Labs Invalid input(s): PROCALCITONIN,  WBC,  LACTICIDVEN     SIGNED:  Lynn Ito, MD  [facility] 04/30/2020, 7:47 AM Pager   If 7PM-7AM, please contact night-coverage [email or web address] Password [email or web address]

## 2020-05-03 NOTE — Code Documentation (Addendum)
54 y.o. 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  Pt was in her usual state of health until about 11:30 when she suddenly became unresponsive.  Per RN, she had just completed flushing her NGT when the patient c/o difficulty breathing then went into PEA arrest.  Code blue was subsequently called. At the time of arrival on scene, ACLS protocol was underway, and team had achieved ROSC with 1 round of CPR.  Pt with labile vital signs post ROSC; SpO2 69% and difficult to ventilate with bag mask post intubation. She received Epinephrine x 2 and sodium bicarb x1 then lost pulse again. CPR was resumed per ACLS protocol with another round of Epinephrine x1 for total of 3 and Sodium bicarb. CPR was continued for over 30 minutes without ROSC. Code was called off and patient pronounced at 12:20 am  I discussed the patient's cardiac arrest with family members including sister and her husband. I was not able to get in touch with patient's son who is the POA and therefore left a message with the sister to notify him if they get a hold of him.  We discuss the patient's progressive decline and persistent critical state and other medical issues which would have impacted her quality of life if ROSC was achieved. Patient's family agreed with discontinuing resuscitative efforts. They will call back once they have talked with the son to discuss funeral home arrangements.    Webb Silversmith, BSN, MSN, DNP, CCRN,FNP-BC , AGACNP - [employer] [facility] [facility] Nurse Practitioner  [facility]

## 2020-05-03 NOTE — Progress Notes (Signed)
Ch arrived at room in response to Code BLUE. No family present at this time. After pulses were gotten, Ch waited to accompany team to ICU. Pt coded again, Ch present until TOD was called. Ch will be available for support when family is reached, and arrive to hospital.

## 2020-05-03 DEATH — deceased

## 2022-02-15 IMAGING — DX DG ABD PORTABLE 1V
1 series · 1 of 1 positions shown · non-contrast
Comparison: 11/21/2010

CLINICAL DATA: NG tube placement

EXAM:
PORTABLE ABDOMEN - 1 VIEW

[abdomen supine]
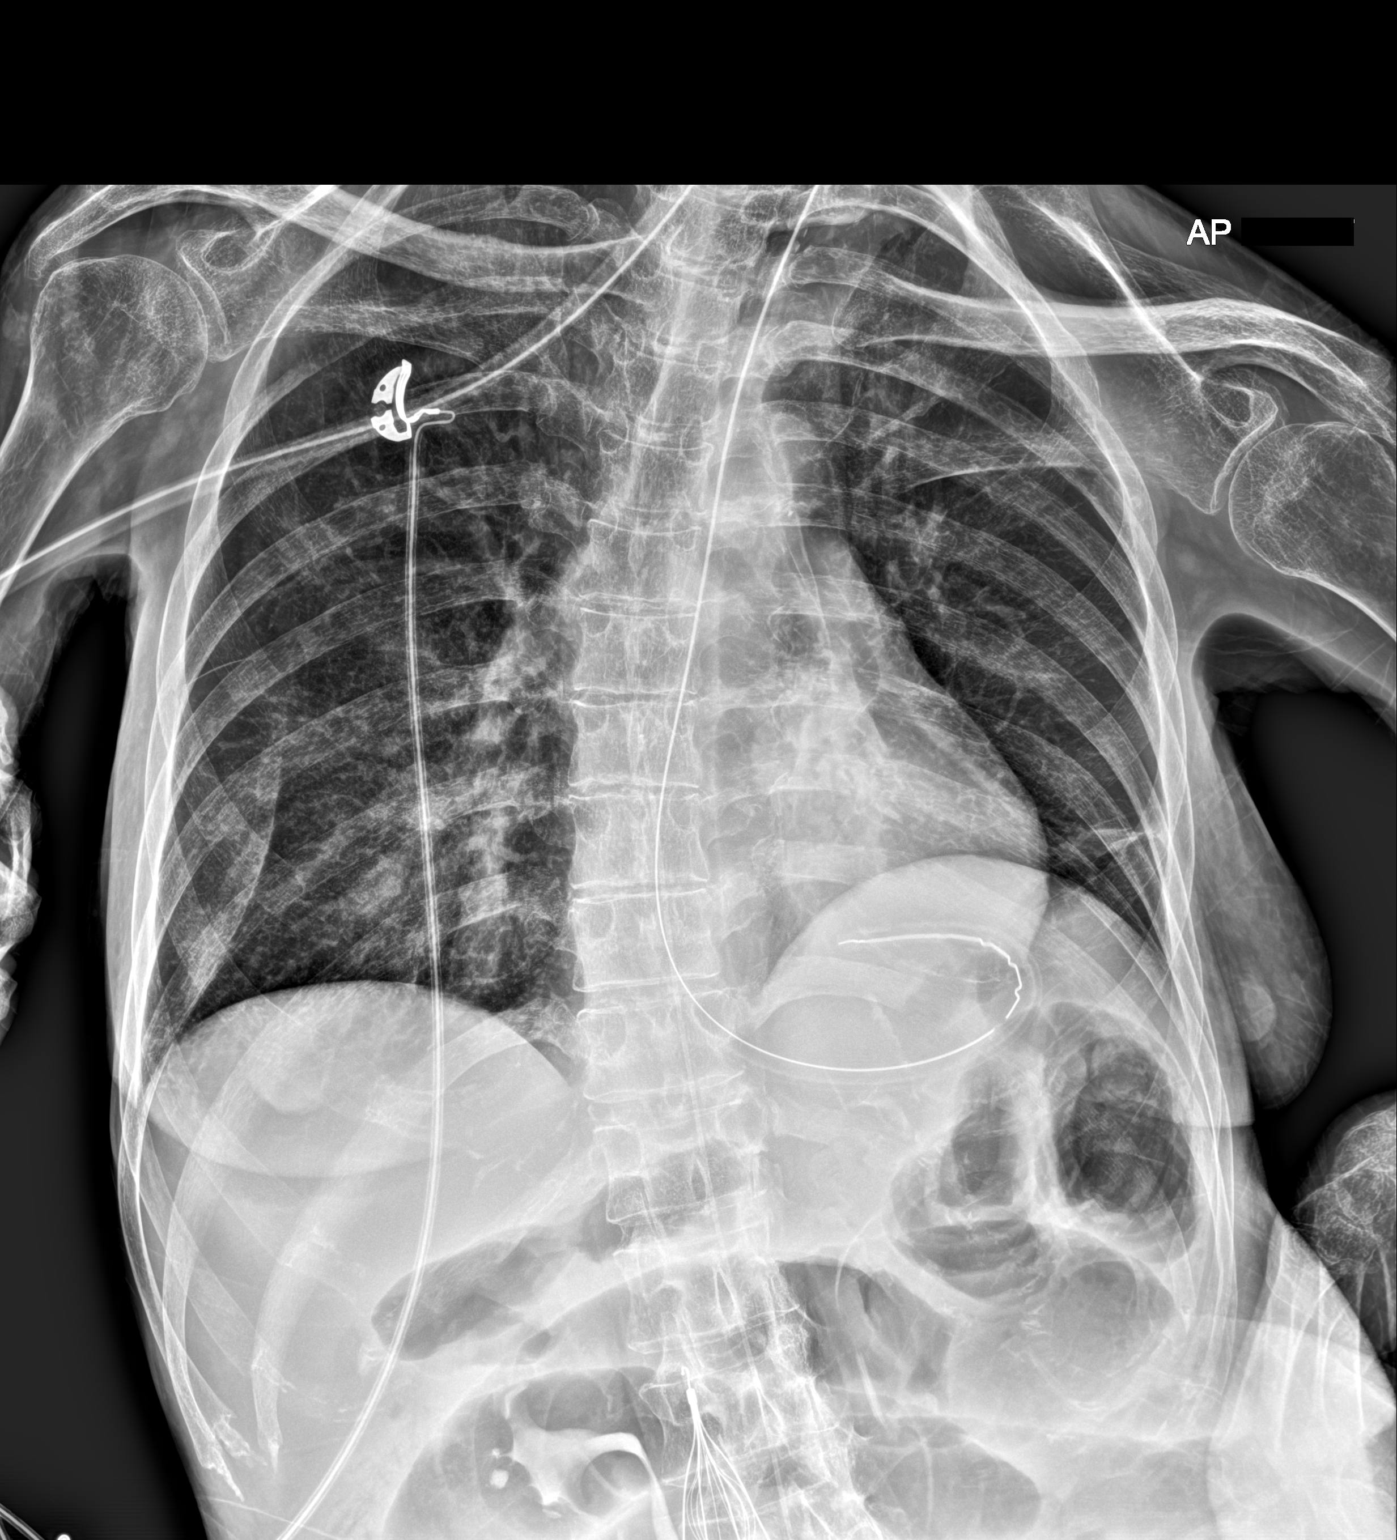

[1 of 1 positions shown; findings below may reference images not displayed]

FINDINGS: NG tube coils in the fundus of the stomach.
IMPRESSION: NG tube in the stomach.

## 2022-02-22 IMAGING — DX DG ABDOMEN 1V
1 series · 1 of 1 positions shown · non-contrast
Comparison: 04/24/2020 radiograph and prior studies

CLINICAL DATA: Abdominal pain, vomiting and weakness.

EXAM:
ABDOMEN - 1 VIEW

[abdomen supine]
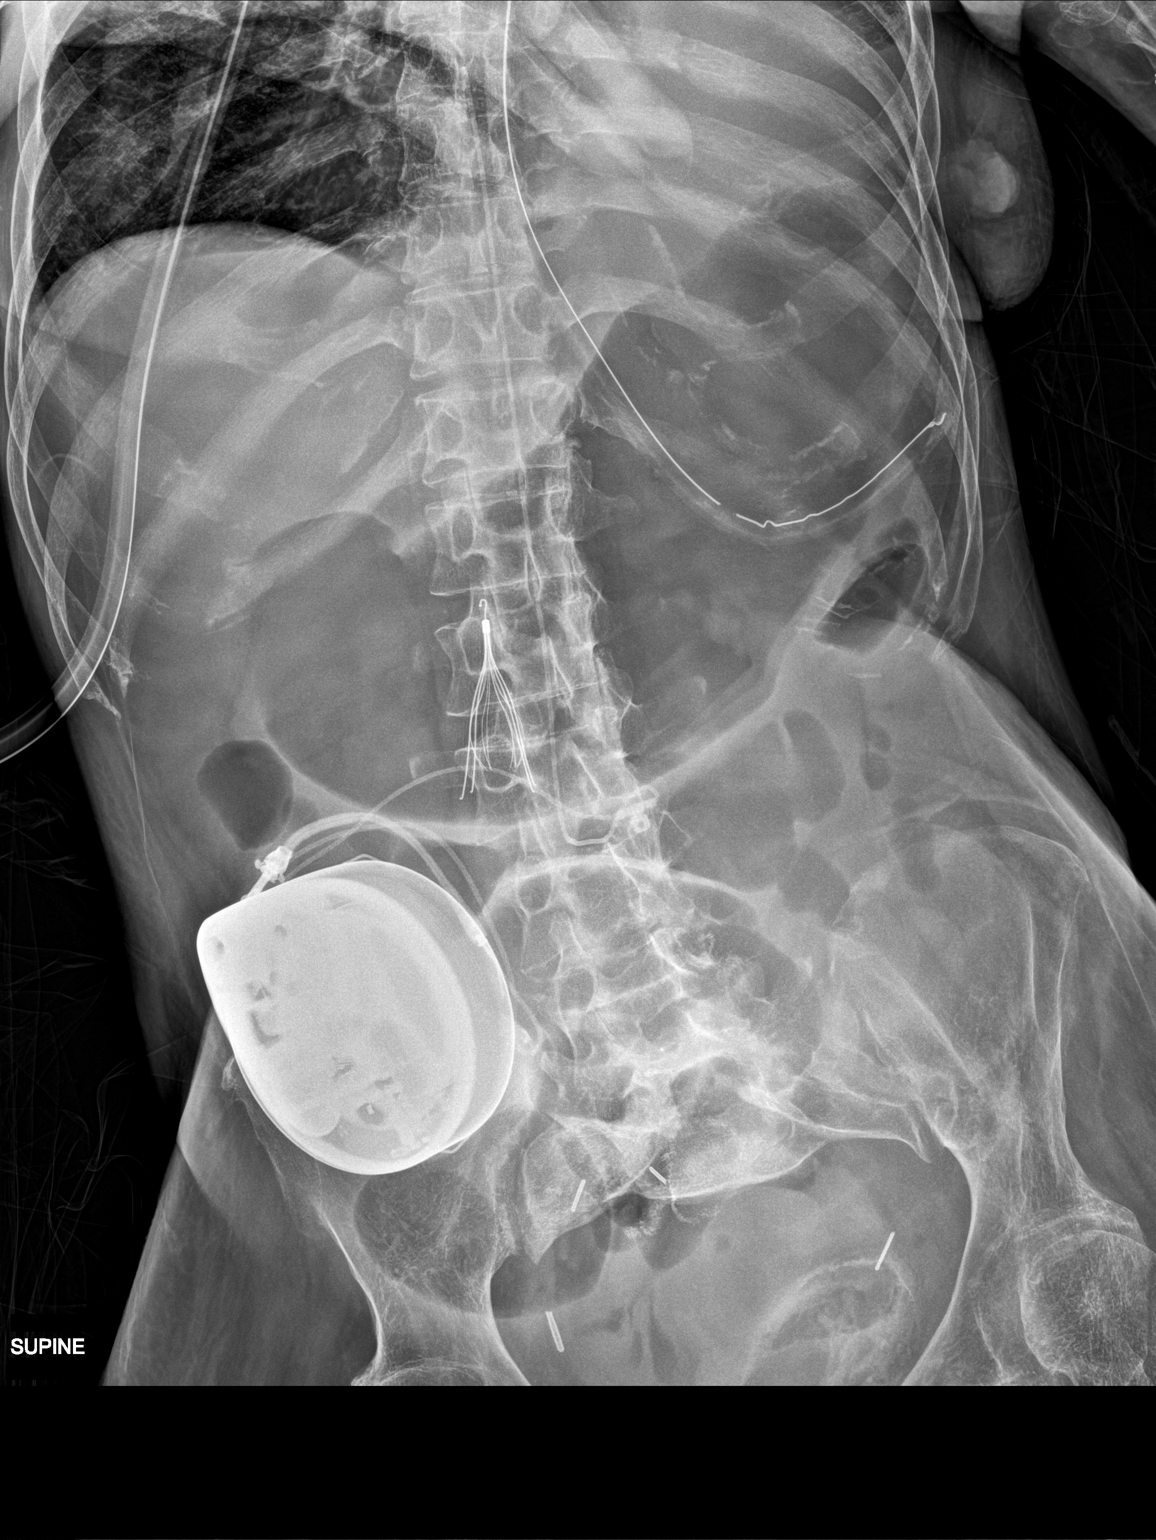

[1 of 1 positions shown; findings below may reference images not displayed]

FINDINGS: An NG tube is noted within the proximal-mid stomach. Mild gastric
distention is noted.

Gas within the colon is noted.

No dilated small bowel loops are present.

IVC filter, spinal cord stimulator and IUD again noted.

No other changes noted.
IMPRESSION: NG tube within the proximal-mid stomach with mild gastric
distention.
